# Patient Record
Sex: Female | Born: 1975
Health system: Southern US, Community
[De-identification: ages and names within clinical notes are randomized; demographics above are authoritative.]

## PROBLEM LIST (undated history)

## (undated) ENCOUNTER — Inpatient Hospital Stay (HOSPITAL_COMMUNITY): Payer: Self-pay

## (undated) DIAGNOSIS — O09529 Supervision of elderly multigravida, unspecified trimester: Secondary | ICD-10-CM

## (undated) DIAGNOSIS — Z3490 Encounter for supervision of normal pregnancy, unspecified, unspecified trimester: Secondary | ICD-10-CM

## (undated) DIAGNOSIS — A599 Trichomoniasis, unspecified: Secondary | ICD-10-CM

## (undated) DIAGNOSIS — F32A Depression, unspecified: Secondary | ICD-10-CM

## (undated) DIAGNOSIS — D649 Anemia, unspecified: Secondary | ICD-10-CM

## (undated) DIAGNOSIS — F4310 Post-traumatic stress disorder, unspecified: Secondary | ICD-10-CM

## (undated) DIAGNOSIS — N949 Unspecified condition associated with female genital organs and menstrual cycle: Secondary | ICD-10-CM

## (undated) DIAGNOSIS — R0602 Shortness of breath: Secondary | ICD-10-CM

## (undated) DIAGNOSIS — F329 Major depressive disorder, single episode, unspecified: Secondary | ICD-10-CM

## (undated) DIAGNOSIS — I10 Essential (primary) hypertension: Secondary | ICD-10-CM

## (undated) HISTORY — DX: Depression, unspecified: F32.A

## (undated) HISTORY — DX: Essential (primary) hypertension: I10

## (undated) HISTORY — DX: Post-traumatic stress disorder, unspecified: F43.10

## (undated) HISTORY — DX: Supervision of elderly multigravida, unspecified trimester: O09.529

## (undated) HISTORY — DX: Encounter for supervision of normal pregnancy, unspecified, unspecified trimester: Z34.90

## (undated) HISTORY — DX: Anemia, unspecified: D64.9

## (undated) HISTORY — DX: Major depressive disorder, single episode, unspecified: F32.9

## (undated) HISTORY — DX: Unspecified condition associated with female genital organs and menstrual cycle: N94.9

## (undated) HISTORY — DX: Shortness of breath: R06.02

## (undated) HISTORY — PX: CHOLECYSTECTOMY: SHX55

## (undated) NOTE — *Deleted (*Deleted)
Physical Medicine and Rehabilitation Admission H&P    Chief Complaint  Patient presents with  . Numbness  : HPI: Joyce [name] is a 64 year old right-handed female with documented history of hypertension, depression and migraine headaches.  Per chart review patient lives with her spouse and children.  Independent prior to admission.  Two-level home bed and bath upstairs.  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  Presented to [facility] 03/04/2020 left-sided weakness.  She denied any chest pain shortness of breath nausea or vomiting.  Cranial CT scan showed no acute changes.  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  Carotid Dopplers with no ICA stenosis.  MRA of the head with no proximal intracranial vessel occlusion or significant stenosis.  Admission chemistries unremarkable except glucose 114, alcohol negative, hemoglobin 15.4, WBC 12,200.  Echocardiogram with ejection fraction of 60 to 65% no wall motion abnormalities.  Presently maintained on aspirin and Plavix for CVA prophylaxis x1 month followed by aspirin alone.  She continues on Topamax for history of headaches.  Tolerating a regular diet.  Therapy evaluations completed and patient was admitted for a comprehensive rehab program.  Review of Systems  Constitutional: Negative for chills and fever.  HENT: Negative for hearing loss.   Eyes: Negative for blurred vision, double vision and discharge.  Respiratory: Negative for shortness of breath.   Cardiovascular: Negative for chest pain, palpitations and leg swelling.  Gastrointestinal: Positive for constipation. Negative for heartburn, nausea and vomiting.  Genitourinary: Negative for dysuria, flank pain and hematuria.  Musculoskeletal: Positive for myalgias.  Neurological: Positive for weakness and headaches.  Psychiatric/Behavioral: Positive for depression. The patient has insomnia.   All other systems reviewed and are negative.  Past Medical  History:  Diagnosis Date  . Acute CVA (cerebrovascular accident) (HCC) 03/04/2020  . AMA (advanced maternal age) multigravida 35+ 11/12/2014  . Anemia   . Depression   . Hypertension   . Migraine   . Pregnant 11/12/2014  . PTSD (post-traumatic stress disorder) 05/01/2017  . Round ligament pain 11/12/2014  . Short of breath on exertion 11/12/2014  . Trichomonas infection    Past Surgical History:  Procedure Laterality Date  . CHOLECYSTECTOMY     Family History  Problem Relation Age of Onset  . Diabetes Mother   . Hypertension Mother   . Kidney disease Mother   . Asthma Mother   . Hyperlipidemia Mother   . Alzheimer's disease Father   . Diabetes Father   . Kidney disease Sister   . Asthma Son   . Diabetes Sister   . Asthma Son   . ADD / ADHD Son   . Heart murmur Son   . Asthma Son    Social History:  reports that she has never smoked. She has never used smokeless tobacco. She reports current alcohol use. She reports that she does not use drugs. Allergies: No Known Allergies Medications Prior to Admission  Medication Sig Dispense Refill  . amLODipine (NORVASC) 5 MG tablet TAKE 1 TABLET(5 MG) BY MOUTH DAILY (Patient taking differently: Take 5 mg by mouth daily. ) 30 tablet 1  . dexamethasone (DECADRON) 4 MG tablet Take 1 tablet (4 mg total) by mouth 2 (two) times daily with a meal. (Patient taking differently: Take 4 mg by mouth daily. ) 10 tablet 0  . escitalopram (LEXAPRO) 20 MG tablet Take 1 tablet (20 mg total) by mouth daily. 30 tablet 0  . hydrOXYzine (ATARAX/VISTARIL) 25 MG tablet Take  1 tablet (25 mg total) by mouth every 6 (six) hours as needed for anxiety. 20 tablet 1  . ibuprofen (ADVIL,MOTRIN) 200 MG tablet Take 200 mg by mouth every 6 (six) hours as needed for mild pain or moderate pain.    [name] lisinopril-hydrochlorothiazide (PRINZIDE,ZESTORETIC) 20-25 MG tablet take one tablet by mouth once daily  3  . medroxyPROGESTERone (DEPO-PROVERA) 150 MG/ML injection Inject 1 mL  (150 mg total) into the muscle every 3 (three) months. 1 mL 4  . promethazine (PHENERGAN) 25 MG tablet Take 1 tablet (25 mg total) by mouth every 6 (six) hours as needed for nausea or vomiting. 30 tablet 0  . SUMAtriptan (IMITREX) 50 MG tablet Take 1 tablet (50 mg total) by mouth once. May repeat in 2 hours if headache persists or recurs. 20 tablet 10  . traZODone (DESYREL) 50 MG tablet Take 1 tablet (50 mg total) by mouth at bedtime as needed for sleep. (Patient taking differently: Take 50 mg by mouth at bedtime. ) 30 tablet 0  . Prenatal Vit-Fe Fumarate-FA (PREPLUS) 27-1 MG TABS TAKE 1 TABLET BY MOUTH DAILY AT 12 NOON (Patient not taking: Reported on 03/05/2020) 651 tablet 0    Drug Regimen Review Drug regimen was reviewed and remains appropriate with no significant issues identified  Home: Home Living Family/patient expects to be discharged to:: Inpatient rehab Living Arrangements: Spouse/significant other, Children Available Help at Discharge: Family, Available 24 hours/day Type of Home: House Home Access: Stairs to enter [employer] of Steps: 4 Entrance Stairs-Rails: None Home Layout: Two level, Bed/bath upstairs Alternate Level Stairs-Number of Steps: 10-15 (1 flight) Alternate Level Stairs-Rails: Left Bathroom Accessibility: Yes Home Equipment: None   Functional History: Prior Function Level of Independence: Independent Comments: community ambulator, drives. 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  Functional Status:  Mobility: Bed Mobility Overal bed mobility: Needs Assistance Bed Mobility: Supine to Sit, Sit to Supine Supine to sit: Supervision, HOB elevated Sit to supine: Min assist General bed mobility comments: assist for LLE back into bed Transfers Overall transfer level: Needs assistance Equipment used: 1 person hand held assist, Rolling walker (2 wheeled) Transfers: Sit to/from Stand, Stand Pivot Transfers Sit to Stand: Min assist, Mod assist Stand pivot  transfers: Min assist, Mod assist General transfer comment: Patient uses bed and PT assist to tranfer to standing and reaches for RW following standing, patient unsteady upon initial standing which improves with use of RW. Heavy UE use with standing, limited weightbearing on LLE due to apprehension Ambulation/Gait Ambulation/Gait assistance: Min assist, Mod assist Gait Distance (Feet): 60 Feet Assistive device: Rolling walker (2 wheeled) Gait Pattern/deviations: Decreased step length - right, Decreased step length - left, Decreased stride length, Decreased dorsiflexion - left, Step-to pattern General Gait Details: very unsteady with poor tolerance for weightbearing on LLE due to weakness and impaired sensation, required use of RW demonstrating slow labored cadence. Patient with c/o hand symptoms increasing with gripping on RW and with fatigue requiring standing rest breaks Gait velocity: decreased    ADL: ADL Overall ADL's : Needs assistance/impaired Eating/Feeding: Set up, Sitting Grooming: Wash/dry hands, Wash/dry face, Oral care, Applying deodorant, Minimal assistance, Sitting Upper Body Bathing: Minimal assistance, Sitting Lower Body Bathing: Moderate assistance, Sitting/lateral leans Upper Body Dressing : Moderate assistance, Sitting Lower Body Dressing: Moderate assistance, Sitting/lateral leans Toilet Transfer Details (indicate cue type and reason): Not tested this date General ADL Comments: Slow labored movements with all activities. Requires increased assistance with LUE due to muscle weakness.  Cognition: Cognition  Overall Cognitive Status: Within Functional Limits for tasks assessed Orientation Level: Oriented X4 Cognition Arousal/Alertness: Awake/alert Behavior During Therapy: WFL for tasks assessed/performed Overall Cognitive Status: Within Functional Limits for tasks assessed  Physical Exam: Blood pressure 136/79, pulse 79, temperature 98.6 F (37 C), temperature  source Oral, resp. rate 20, height 5\' 6"  (1.676 m), weight 97.9 kg, SpO2 100 %. Physical Exam Neurological:     Comments: Patient is alert in no acute distress.  Speech is fluent.  Oriented x3 and follows commands.     No results found for this or any previous visit (from the past 48 hour(s)). No results found.     Medical Problem List and Plan: 1.  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  Antithrombotics: -DVT/anticoagulation: SCDs  -antiplatelet therapy: Aspirin 305 mg daily and Plavix 75 mg daily x1 month then aspirin alone 3. Pain Management: Lidoderm patch, oxycodone as needed 4. Mood: Provide emotional support  -antipsychotic agents: N/A 5. Neuropsych: This patient is capable of making decisions on her own behalf. 6. Skin/Wound Care: Routine skin checks 7. Fluids/Electrolytes/Nutrition: Routine in and outs with follow-up chemistries 8.  Migraine headaches.  Topamax 25 mg daily 9.  Hyperlipidemia.  Lipitor    ***  Charlton Amor, PA-C 03/09/2020

---

## 2011-06-06 ENCOUNTER — Emergency Department (HOSPITAL_COMMUNITY)
Admission: EM | Admit: 2011-06-06 | Discharge: 2011-06-07 | Disposition: A | Discharge status: Home / Self Care | Payer: Medicaid Other | Attending: Emergency Medicine | Admitting: Emergency Medicine

## 2011-06-06 DIAGNOSIS — D649 Anemia, unspecified: Secondary | ICD-10-CM | POA: Insufficient documentation

## 2011-06-06 DIAGNOSIS — R109 Unspecified abdominal pain: Secondary | ICD-10-CM | POA: Insufficient documentation

## 2011-06-06 DIAGNOSIS — E876 Hypokalemia: Secondary | ICD-10-CM | POA: Insufficient documentation

## 2011-06-06 DIAGNOSIS — K219 Gastro-esophageal reflux disease without esophagitis: Secondary | ICD-10-CM

## 2011-06-06 DIAGNOSIS — R51 Headache: Secondary | ICD-10-CM | POA: Insufficient documentation

## 2011-06-06 MED ORDER — SODIUM CHLORIDE 0.9 % IV SOLN: Freq: Once | INTRAVENOUS | Status: DC

## 2011-06-06 NOTE — ED Notes (Signed)
1.ab pain that started tonight after eating at [employer], denies any n/v/or fever 2.migraine since yesterday out of meds--"a couple of months", dizzy at times

## 2011-06-07 LAB — COMPREHENSIVE METABOLIC PANEL
ALT: 13 U/L (ref 0–35)
AST: 16 U/L (ref 0–37)
Alkaline Phosphatase: 56 U/L (ref 39–117)
CO2: 30 mEq/L (ref 19–32)
Chloride: 106 mEq/L (ref 96–112)
Creatinine, Ser: 0.73 mg/dL (ref 0.50–1.10)
GFR calc non Af Amer: >90 mL/min (ref >90)
Potassium: 3.4 mEq/L — ABNORMAL LOW (ref 3.5–5.1)
Sodium: 142 mEq/L (ref 135–145)
Total Bilirubin: 0.2 mg/dL — ABNORMAL LOW (ref 0.3–1.2)

## 2011-06-07 LAB — DIFFERENTIAL
Basophils Absolute: 0 10*3/uL (ref 0.0–0.1)
Lymphocytes Relative: 31 % (ref 12–46)
Monocytes Absolute: 0.5 10*3/uL (ref 0.1–1.0)
Neutro Abs: 5.8 10*3/uL (ref 1.7–7.7)
Neutrophils Relative %: 63 % (ref 43–77)

## 2011-06-07 LAB — CBC
HCT: 33.9 % — ABNORMAL LOW (ref 36.0–46.0)
RDW: 17.9 % — ABNORMAL HIGH (ref 11.5–15.5)
WBC: 9.1 10*3/uL (ref 4.0–10.5)

## 2011-06-07 MED ORDER — SODIUM CHLORIDE 0.9 % IV BOLUS (SEPSIS)
1000.0000 mL | Freq: Once | INTRAVENOUS | Status: AC
  Administered 2011-06-07: 1000 mL via INTRAVENOUS

## 2011-06-07 MED ORDER — PROMETHAZINE HCL 25 MG PO TABS: 25.0000 mg | ORAL_TABLET | Freq: Four times a day (QID) | ORAL | Status: DC | PRN

## 2011-06-07 MED ORDER — DIPHENHYDRAMINE HCL 50 MG/ML IJ SOLN
50.0000 mg | Freq: Once | INTRAMUSCULAR | Status: AC
  Administered 2011-06-07: 50 mg via INTRAVENOUS
  Filled 2011-06-07: qty 1

## 2011-06-07 MED ORDER — FAMOTIDINE IN NACL 20-0.9 MG/50ML-% IV SOLN
20.0000 mg | Freq: Once | INTRAVENOUS | Status: AC
  Administered 2011-06-07: 20 mg via INTRAVENOUS
  Filled 2011-06-07: qty 50

## 2011-06-07 MED ORDER — METOCLOPRAMIDE HCL 5 MG/ML IJ SOLN
10.0000 mg | Freq: Once | INTRAMUSCULAR | Status: AC
  Administered 2011-06-07: 10 mg via INTRAVENOUS
  Filled 2011-06-07: qty 2

## 2011-06-07 MED ORDER — SODIUM CHLORIDE 0.9 % IV SOLN: INTRAVENOUS | Status: DC

## 2011-06-07 NOTE — ED Provider Notes (Signed)
History     CSN: [id number]  Arrival date & time 06/06/11  2252   First MD Initiated Contact with Patient 06/07/11 0034      Chief Complaint  Patient presents with  . Abdominal Pain    buring into throat  . Migraine    (Consider location/radiation/quality/duration/timing/severity/associated sxs/prior treatment) HPI  Patient relates she started having lower abdominal pain but this is described as burning and radiates up into her chest this started about an hour prior to arrival. She relates it started after eating a double cheeseburger from [employer] which she's had in the past. She denies the pain radiating to her back. She denies nausea, vomiting, or cough. She states deep breathing etc. pain worse she states she does feel short of breath. She states she had similar pain once before last month and was seen in another hospital. She relates she had a cholecystectomy in 1997. She also relates she started having a headache about a year ago. She states this headache started about 2 days ago. The location is in the frontal part of her head. She states it throbs and she has photophobia and noise sensitivity with it. She states she was diagnosed as having migraines when she lived in [place].  PCP none  Past Medical History  Diagnosis Date  . Migraine     Past Surgical History  Procedure Date  . Cholecystectomy     No family history on file.  History  Substance Use Topics  . Smoking status: Never Smoker   . Smokeless tobacco: Not on file  . Alcohol Use: No   patient is unemployed  OB History    Grav Para Term Preterm Abortions TAB SAB Ect Mult Living                  Review of Systems  All other systems reviewed and are negative.    Allergies  Review of patient's allergies indicates no known allergies.  Home Medications  No current outpatient prescriptions on file.  BP 122/67  Pulse 67  Temp(Src) 98.7 F (37.1 C) (Oral)  Resp 20  Ht 5\' 5"  (1.651 m)  Wt 228 lb  (103.42 kg)  BMI 37.94 kg/m2  SpO2 100%  LMP 05/31/2011  Vital signs normal    Physical Exam  Nursing note and vitals reviewed. Constitutional: She is oriented to person, place, and time. She appears well-developed and well-nourished.  Non-toxic appearance. She does not appear ill. No distress.  HENT:  Head: Normocephalic and atraumatic.  Right Ear: External ear normal.  Left Ear: External ear normal.  Nose: Nose normal. No mucosal edema or rhinorrhea.  Mouth/Throat: Oropharynx is clear and moist and mucous membranes are normal. No dental abscesses or uvula swelling.  Eyes: Conjunctivae and EOM are normal. Pupils are equal, round, and reactive to light.  Neck: Normal range of motion and full passive range of motion without pain. Neck supple.  Cardiovascular: Normal rate, regular rhythm and normal heart sounds.  Exam reveals no gallop and no friction rub.   No murmur heard. Pulmonary/Chest: Effort normal and breath sounds normal. No respiratory distress. She has no wheezes. She has no rhonchi. She has no rales. She exhibits no tenderness and no crepitus.  Abdominal: Soft. Normal appearance and bowel sounds are normal. She exhibits no distension. There is tenderness. There is no rebound and no guarding.       Patient has some mild tenderness diffusely without localization  Musculoskeletal: Normal range of motion. She exhibits  no edema and no tenderness.       Moves all extremities well.   Neurological: She is alert and oriented to person, place, and time. She has normal strength. No cranial nerve deficit.  Skin: Skin is warm, dry and intact. No rash noted. No erythema. No pallor.  Psychiatric: Her speech is normal and behavior is normal. Her mood appears not anxious.       Affect is flat    ED Course  Procedures (including critical care time)   Medications  0.9 %  sodium chloride infusion (  Intravenous Handoff 06/06/11 2302)  0.9 %  sodium chloride infusion (not administered)    sodium chloride 0.9 % bolus 1,000 mL (1000 mL Intravenous Given 06/07/11 0138)  metoCLOPramide (REGLAN) injection 10 mg (10 mg Intravenous Given 06/07/11 0138)  diphenhydrAMINE (BENADRYL) injection 50 mg (50 mg Intravenous Given 06/07/11 0138)  famotidine (PEPCID) IVPB 20 mg (20 mg Intravenous New Bag/Given 06/07/11 0138)    Recheck at 03:45 patient states she is much better and ready to go home.    Results for orders placed during the hospital encounter of 06/06/11  CBC      Component Value Range   WBC 9.1  4.0 - 10.5 (K/uL)   RBC 4.84  3.87 - 5.11 (MIL/uL)   Hemoglobin 10.9 (*) 12.0 - 15.0 (g/dL)   HCT 16.1 (*) 09.6 - 46.0 (%)   MCV 70.0 (*) 78.0 - 100.0 (fL)   MCH 22.5 (*) 26.0 - 34.0 (pg)   MCHC 32.2  30.0 - 36.0 (g/dL)   RDW 04.5 (*) 40.9 - 15.5 (%)   Platelets 319  150 - 400 (K/uL)  DIFFERENTIAL      Component Value Range   Neutrophils Relative 63  43 - 77 (%)   Neutro Abs 5.8  1.7 - 7.7 (K/uL)   Lymphocytes Relative 31  12 - 46 (%)   Lymphs Abs 2.8  0.7 - 4.0 (K/uL)   Monocytes Relative 6  3 - 12 (%)   Monocytes Absolute 0.5  0.1 - 1.0 (K/uL)   Eosinophils Relative 1  0 - 5 (%)   Eosinophils Absolute 0.1  0.0 - 0.7 (K/uL)   Basophils Relative 0  0 - 1 (%)   Basophils Absolute 0.0  0.0 - 0.1 (K/uL)  COMPREHENSIVE METABOLIC PANEL      Component Value Range   Sodium 142  135 - 145 (mEq/L)   Potassium 3.4 (*) 3.5 - 5.1 (mEq/L)   Chloride 106  96 - 112 (mEq/L)   CO2 30  19 - 32 (mEq/L)   Glucose, Bld 105 (*) 70 - 99 (mg/dL)   BUN 7  6 - 23 (mg/dL)   Creatinine, Ser 8.11  0.50 - 1.10 (mg/dL)   Calcium 9.6  8.4 - 91.4 (mg/dL)   Total Protein 7.7  6.0 - 8.3 (g/dL)   Albumin 3.6  3.5 - 5.2 (g/dL)   AST 16  0 - 37 (U/L)   ALT 13  0 - 35 (U/L)   Alkaline Phosphatase 56  39 - 117 (U/L)   Total Bilirubin 0.2 (*) 0.3 - 1.2 (mg/dL)   GFR calc non Af Amer >90  >90 (mL/min)   GFR calc Af Amer >90  >90 (mL/min)  LIPASE, BLOOD      Component Value Range   Lipase 26  11 - 59 (U/L)    Laboratory interpretation all normal except mild hypokalemia, anemia    Diagnoses that have been ruled out:  Diagnoses that are still under consideration:  Final diagnoses:  Headache  Abdominal pain  GERD (gastroesophageal reflux disease)   New Prescriptions   PROMETHAZINE (PHENERGAN) 25 MG TABLET    Take 1 tablet (25 mg total) by mouth every 6 (six) hours as needed for nausea.   Plan discharge    Devoria Albe, MD, FACEP     MDM          Ward Givens, MD 06/07/11 [phone]

## 2011-07-23 ENCOUNTER — Emergency Department (HOSPITAL_COMMUNITY)
Admission: EM | Admit: 2011-07-23 | Discharge: 2011-07-23 | Disposition: A | Discharge status: Home / Self Care | Payer: Medicaid Other | Attending: Emergency Medicine | Admitting: Emergency Medicine

## 2011-07-23 ENCOUNTER — Encounter (HOSPITAL_COMMUNITY): Payer: Self-pay | Admitting: Emergency Medicine

## 2011-07-23 DIAGNOSIS — J3489 Other specified disorders of nose and nasal sinuses: Secondary | ICD-10-CM | POA: Insufficient documentation

## 2011-07-23 DIAGNOSIS — J069 Acute upper respiratory infection, unspecified: Secondary | ICD-10-CM | POA: Insufficient documentation

## 2011-07-23 MED ORDER — GUAIFENESIN-CODEINE 100-10 MG/5ML PO SOLN
5.0000 mL | Freq: Once | ORAL | Status: AC
  Administered 2011-07-23: 5 mL via ORAL
  Filled 2011-07-23: qty 5

## 2011-07-23 MED ORDER — IBUPROFEN 800 MG PO TABS
800.0000 mg | ORAL_TABLET | Freq: Once | ORAL | Status: AC
  Administered 2011-07-23: 800 mg via ORAL
  Filled 2011-07-23: qty 1

## 2011-07-23 NOTE — ED Provider Notes (Signed)
History     CSN: [id number]  Arrival date & time 07/23/11  0116   First MD Initiated Contact with Patient 07/23/11 0130      Chief Complaint  Patient presents with  . Sore Throat  . Cough  . Nasal Congestion    (Consider location/radiation/quality/duration/timing/severity/associated sxs/prior treatment) HPI OPAL DINNING is a 36 y.o. female who presents to the Emergency Department complaining of sore throat, runny nose, cough, and nasal congestion x 2 days. She has been taking daquel and niquellwith no relief. Denies fever, chills, nausea, vomiting, shortness of breath. Cough is worse at night.   No PCP   Past Medical History  Diagnosis Date  . Migraine     Past Surgical History  Procedure Date  . Cholecystectomy     No family history on file.  History  Substance Use Topics  . Smoking status: Never Smoker   . Smokeless tobacco: Not on file  . Alcohol Use: No    OB History    Grav Para Term Preterm Abortions TAB SAB Ect Mult Living                  Review of Systems A 10 review of systems reviewed and are negative for acute change except as noted in the HPI. Allergies  Review of patient's allergies indicates no known allergies.  Home Medications  No current outpatient prescriptions on file.  BP 134/88  Pulse 86  Temp(Src) 98.7 F (37.1 C) (Oral)  Resp 20  Ht 5\' 5"  (1.651 m)  Wt 226 lb (102.513 kg)  BMI 37.61 kg/m2  SpO2 100%  LMP 07/16/2011  Physical Exam Physical examination:  Nursing notes reviewed; Vital signs and O2 SAT reviewed;  Constitutional: Well developed, Well nourished, Well hydrated, In no acute distress; Head:  Normocephalic, atraumatic; Eyes: EOMI, PERRL, No scleral icterus; ENMT: Mouth and pharynx normal, Mucous membranes moist nasal mucosa normal; Neck: Supple, Full range of motion, No lymphadenopathy; Cardiovascular: Regular rate and rhythm, No murmur, rub, or gallop; Respiratory: Breath sounds clear & equal bilaterally, No  rales, rhonchi, wheezes, or rub, Normal respiratory effort/excursion; Chest: Nontender, Movement normal; Abdomen: Soft, Nontender, Nondistended, Normal bowel sounds; Genitourinary: No CVA tenderness; Extremities: Pulses normal, No tenderness, No edema, No calf edema or asymmetry.; Neuro: AA&Ox3, Major CN grossly intact.  No gross focal motor or sensory deficits in extremities.; Skin: Color normal, Warm, Dry  ED Course  Procedures (including critical care time)     MDM  Patient with URI symptoms x 2 days with no relief from OTC medicines. Given ibuprofen and cough medicine. Pt stable in ED with no significant deterioration in condition.The patient appears reasonably screened and/or stabilized for discharge and I doubt any other medical condition or other [facility] requiring further screening, evaluation, or treatment in the ED at this time prior to discharge.  MDM Reviewed: nursing note and vitals           Nicoletta Dress. Colon Branch, MD 07/23/11 [id number]

## 2011-07-23 NOTE — ED Notes (Signed)
Patient c/o sore throat, cough and congestion x 2 days; states has been taking OTC medicine without relief.

## 2011-07-23 NOTE — Discharge Instructions (Signed)
DRINK LOTS OF FLUIDS. USE TYLENOL OR IBUPROFEN FOR ACHES, PAINS AND ANY FEVERS. USE SALT WATER GARGLES, LOZENGES, HOT BEVERAGES TO HELP WITH THE SORE THROAT. USE SALINE NOSE DROPS FOR THE RUNNY NOSE. USE ROB TUSSIN DM OR VICKS FORMULA 44 COUGH MEDICINE.

## 2012-03-13 ENCOUNTER — Emergency Department (HOSPITAL_COMMUNITY)
Admission: EM | Admit: 2012-03-13 | Discharge: 2012-03-13 | Disposition: A | Discharge status: Home / Self Care | Payer: Medicaid Other | Attending: Emergency Medicine | Admitting: Emergency Medicine

## 2012-03-13 ENCOUNTER — Encounter (HOSPITAL_COMMUNITY): Payer: Self-pay | Admitting: *Deleted

## 2012-03-13 DIAGNOSIS — L03818 Cellulitis of other sites: Secondary | ICD-10-CM | POA: Insufficient documentation

## 2012-03-13 DIAGNOSIS — L0291 Cutaneous abscess, unspecified: Secondary | ICD-10-CM

## 2012-03-13 DIAGNOSIS — L02818 Cutaneous abscess of other sites: Secondary | ICD-10-CM | POA: Insufficient documentation

## 2012-03-13 MED ORDER — KETOROLAC TROMETHAMINE 10 MG PO TABS
10.0000 mg | ORAL_TABLET | Freq: Once | ORAL | Status: AC
  Administered 2012-03-13: 10 mg via ORAL
  Filled 2012-03-13: qty 1

## 2012-03-13 MED ORDER — OXYCODONE-ACETAMINOPHEN 5-325 MG PO TABS
2.0000 | ORAL_TABLET | Freq: Once | ORAL | Status: AC
  Administered 2012-03-13: 2 via ORAL
  Filled 2012-03-13: qty 2

## 2012-03-13 MED ORDER — ONDANSETRON HCL 4 MG PO TABS
4.0000 mg | ORAL_TABLET | Freq: Once | ORAL | Status: AC
  Administered 2012-03-13: 4 mg via ORAL
  Filled 2012-03-13: qty 1

## 2012-03-13 MED ORDER — LIDOCAINE-EPINEPHRINE (PF) 1 %-1:200000 IJ SOLN
INTRAMUSCULAR | Status: AC
  Filled 2012-03-13: qty 10

## 2012-03-13 MED ORDER — OXYCODONE-ACETAMINOPHEN 5-325 MG PO TABS: 1.0000 | ORAL_TABLET | Freq: Four times a day (QID) | ORAL | Status: DC | PRN

## 2012-03-13 MED ORDER — DOXYCYCLINE HYCLATE 100 MG PO CAPS: 100.0000 mg | ORAL_CAPSULE | Freq: Two times a day (BID) | ORAL | Status: DC

## 2012-03-13 MED ORDER — AMOXICILLIN 500 MG PO CAPS: 500.0000 mg | ORAL_CAPSULE | Freq: Three times a day (TID) | ORAL | Status: DC

## 2012-03-13 MED ORDER — DICLOFENAC SODIUM 75 MG PO TBEC: 75.0000 mg | DELAYED_RELEASE_TABLET | Freq: Two times a day (BID) | ORAL | Status: AC

## 2012-03-13 NOTE — ED Notes (Signed)
Swelling on back on neck, states it started yesterday and got worse today

## 2012-03-13 NOTE — ED Provider Notes (Signed)
History     CSN: [id number]  Arrival date & time 03/13/12  2148   First MD Initiated Contact with Patient 03/13/12 2200      No chief complaint on file.   (Consider location/radiation/quality/duration/timing/severity/associated sxs/prior treatment) HPI Comments: Patient presents to the emergency department with a painful raised area on the right side of her neck at the base of the scalp. The patient has recently stopped her here in a corn row fashion. The patient states she's not sure she may have scratched herself at that time or not. The patient denies any recent use of relaxers or conditioners. Patient denies any known insect bites to this area. She complains of pain in the right side of the neck that causes pain also in the right shoulder. The problem seems to have been getting worse today. Patient denies any recent fever or chills.  The history is provided by the patient.    Past Medical History  Diagnosis Date  . Migraine     Past Surgical History  Procedure Date  . Cholecystectomy     No family history on file.  History  Substance Use Topics  . Smoking status: Never Smoker   . Smokeless tobacco: Not on file  . Alcohol Use: No    OB History    Grav Para Term Preterm Abortions TAB SAB Ect Mult Living                  Review of Systems  Constitutional: Negative for activity change.       All ROS Neg except as noted in HPI  HENT: Positive for dental problem. Negative for neck pain.   Eyes: Negative for discharge.  Respiratory: Negative for wheezing.   Cardiovascular: Negative for chest pain and palpitations.  Gastrointestinal: Negative for abdominal pain and blood in stool.  Genitourinary: Negative for dysuria, frequency and hematuria.  Musculoskeletal: Negative for back pain and arthralgias.  Skin: Negative.   Neurological: Positive for headaches. Negative for dizziness, seizures and speech difficulty.  Psychiatric/Behavioral: Negative for hallucinations  and confusion.    Allergies  Review of patient's allergies indicates no known allergies.  Home Medications  No current outpatient prescriptions on file.  BP 147/85  Pulse 94  Temp 98.4 F (36.9 C) (Oral)  Resp 20  Ht 5\' 5"  (1.651 m)  Wt 219 lb (99.338 kg)  BMI 36.44 kg/m2  SpO2 100%  LMP 03/09/2012  Physical Exam  Nursing note and vitals reviewed. Constitutional: She is oriented to person, place, and time. She appears well-developed and well-nourished.  Non-toxic appearance.  HENT:  Head: Normocephalic.  Right Ear: Tympanic membrane and external ear normal.  Left Ear: Tympanic membrane and external ear normal.       There is a quarter size raised red tender area to palpation at the right base of the scalp. There is no red streaking noted. Is no drainage from the area. The area is tender to touch. The area is warm but not hot. There no palpable nodes present.  Eyes: EOM and lids are normal. Pupils are equal, round, and reactive to light.  Neck: Normal range of motion. Neck supple. Carotid bruit is not present.  Cardiovascular: Normal rate, regular rhythm, normal heart sounds, intact distal pulses and normal pulses.   Pulmonary/Chest: Breath sounds normal. No respiratory distress.  Abdominal: Soft. Bowel sounds are normal. There is no tenderness. There is no guarding.  Musculoskeletal: Normal range of motion.  Lymphadenopathy:       Head (  right side): No submandibular adenopathy present.       Head (left side): No submandibular adenopathy present.    She has no cervical adenopathy.  Neurological: She is alert and oriented to person, place, and time. She has normal strength. No cranial nerve deficit or sensory deficit.  Skin: Skin is warm and dry.  Psychiatric: Her speech is normal. Her mood appears anxious.    ED Course  Procedures:ASPIRATION OF ABSCESS - patient identified by arm band. Permission for the procedure given by the patient. Procedural time out taken before  aspiration of abscess of the right scalp. The right lower scalp was painted with alcohol, this was followed by painting with Betadine. A wheal was raised with 1% lidocaine with epinephrine. The area was then aspirated with very minimal pus like material present. Pressure was applied. A sterile bandage was applied by me. Patient tolerated the procedure without problem. The small amount of drainage obtained was sent to the lab for culture.  Labs Reviewed - No data to display No results found. Pulse oximetry 100% on room air. Within normal limits by my interpretation.  No diagnosis found.    MDM  I have reviewed nursing notes, vital signs, and all appropriate lab and imaging results for this patient. Patient has a abscess at the right lower scalp. Patient is advised to use warm tub soaks daily. She will be placed on doxycycline, diclofenac, and Percocet #15. Patient advised to return if any changes or problems.       Kathie Dike, [place] 03/13/12 [phone]

## 2012-03-13 NOTE — ED Notes (Signed)
Swollen area to left post neck,x 2 days,  Tender to palp, No scalp lesions known.

## 2012-03-14 NOTE — ED Provider Notes (Signed)
Medical screening examination/treatment/procedure(s) were performed by non-physician practitioner and as supervising physician I was immediately available for consultation/collaboration.   Horacio Werth L Sameen Leas, MD 03/14/12 0008 

## 2012-03-17 LAB — CULTURE, ROUTINE-ABSCESS: Culture: NO GROWTH

## 2012-06-09 ENCOUNTER — Encounter (HOSPITAL_COMMUNITY): Payer: Self-pay | Admitting: Emergency Medicine

## 2012-06-09 ENCOUNTER — Emergency Department (HOSPITAL_COMMUNITY)
Admission: EM | Admit: 2012-06-09 | Discharge: 2012-06-09 | Disposition: A | Discharge status: Home / Self Care | Payer: Medicaid Other | Attending: Emergency Medicine | Admitting: Emergency Medicine

## 2012-06-09 DIAGNOSIS — Z8679 Personal history of other diseases of the circulatory system: Secondary | ICD-10-CM | POA: Insufficient documentation

## 2012-06-09 DIAGNOSIS — Z792 Long term (current) use of antibiotics: Secondary | ICD-10-CM | POA: Insufficient documentation

## 2012-06-09 DIAGNOSIS — M545 Low back pain, unspecified: Secondary | ICD-10-CM | POA: Insufficient documentation

## 2012-06-09 DIAGNOSIS — Z79899 Other long term (current) drug therapy: Secondary | ICD-10-CM | POA: Insufficient documentation

## 2012-06-09 LAB — URINALYSIS, ROUTINE W REFLEX MICROSCOPIC
Leukocytes, UA: NEGATIVE
Nitrite: NEGATIVE
Specific Gravity, Urine: <1.005 — ABNORMAL LOW (ref 1.005–1.030)
Urobilinogen, UA: 0.2 mg/dL (ref 0.0–1.0)
pH: 6 (ref 5.0–8.0)

## 2012-06-09 MED ORDER — OXYCODONE-ACETAMINOPHEN 5-325 MG PO TABS
2.0000 | ORAL_TABLET | Freq: Once | ORAL | Status: AC
  Administered 2012-06-09: 2 via ORAL
  Filled 2012-06-09: qty 2

## 2012-06-09 MED ORDER — IBUPROFEN 800 MG PO TABS
800.0000 mg | ORAL_TABLET | Freq: Once | ORAL | Status: AC
  Administered 2012-06-09: 800 mg via ORAL
  Filled 2012-06-09: qty 1

## 2012-06-09 MED ORDER — METHOCARBAMOL 500 MG PO TABS: 500.0000 mg | ORAL_TABLET | Freq: Two times a day (BID) | ORAL | Status: DC

## 2012-06-09 MED ORDER — HYDROCODONE-ACETAMINOPHEN 5-325 MG PO TABS: 1.0000 | ORAL_TABLET | Freq: Four times a day (QID) | ORAL | Status: DC | PRN

## 2012-06-09 MED ORDER — IBUPROFEN 600 MG PO TABS: 600.0000 mg | ORAL_TABLET | Freq: Four times a day (QID) | ORAL | Status: DC | PRN

## 2012-06-09 NOTE — ED Notes (Addendum)
Patient c/o pain to right side of lower back since Jan 1st.  Patient states pain is worse with movement of her neck in downward motion; states pain radiates down her legs.

## 2012-06-09 NOTE — ED Provider Notes (Signed)
History     CSN: [id number]  Arrival date & time 06/09/12  0205   First MD Initiated Contact with Patient 06/09/12 0344      Chief Complaint  Patient presents with  . Back Pain    (Consider location/radiation/quality/duration/timing/severity/associated sxs/prior treatment) HPI Joyce [name] is a 37 y.o. female presenting with back pain. This pain has been present since January 1, it is been getting worse, this has not been resolved with medicine that she's taken from the emergency department previously. Patient denies any antecedent trauma, denies any weight loss, fevers, night sweats, weakness, numbness or tingling. Her pain is on the right side of the lower back, it radiates to the mid posterior thigh, pain is currently 9/10, she has tried [employer], icy hot without any help. No loss of bowel or bladder function, no urinary retention, or numbness or tingling in the perianal region.   Past Medical History  Diagnosis Date  . Migraine     Past Surgical History  Procedure Date  . Cholecystectomy     No family history on file.  History  Substance Use Topics  . Smoking status: Never Smoker   . Smokeless tobacco: Not on file  . Alcohol Use: No    OB History    Grav Para Term Preterm Abortions TAB SAB Ect Mult Living                  Review of Systems At least 10pt or greater review of systems completed and are negative except where specified in the HPI.  Allergies  Review of patient's allergies indicates no known allergies.  Home Medications   Current Outpatient Rx  Name  Route  Sig  Dispense  Refill  . AMOXICILLIN 500 MG PO CAPS   Oral   Take 1 capsule (500 mg total) by mouth 3 (three) times daily.   21 capsule   0   . DICLOFENAC SODIUM 75 MG PO TBEC   Oral   Take 1 tablet (75 mg total) by mouth 2 (two) times daily.   12 tablet   0   . DOXYCYCLINE HYCLATE 100 MG PO CAPS   Oral   Take 1 capsule (100 mg total) by mouth 2 (two) times daily.   14 capsule   0   . OXYCODONE-ACETAMINOPHEN 5-325 MG PO TABS   Oral   Take 1 tablet by mouth every 6 (six) hours as needed for pain.   20 tablet   0     BP 149/90  Pulse 80  Temp 98.1 F (36.7 C) (Oral)  Resp 18  Ht 5\' 5"  (1.651 m)  Wt 224 lb (101.606 kg)  BMI 37.28 kg/m2  SpO2 100%  LMP 05/31/2012  Physical Exam  Nursing notes reviewed.  Electronic medical record reviewed. VITAL SIGNS:   Filed Vitals:   06/09/12 0218  BP: 149/90  Pulse: 80  Temp: 98.1 F (36.7 C)  TempSrc: Oral  Resp: 18  Height: 5\' 5"  (1.651 m)  Weight: 224 lb (101.606 kg)  SpO2: 100%   CONSTITUTIONAL: Awake, oriented, appears non-toxic HENT: Atraumatic, normocephalic, oral mucosa pink and moist, airway patent. Nares patent without drainage. External ears normal. EYES: Conjunctiva clear, EOMI, PERRLA NECK: Trachea midline, non-tender, supple CARDIOVASCULAR: Normal heart rate, Normal rhythm, No murmurs, rubs, gallops PULMONARY/CHEST: Clear to auscultation, no rhonchi, wheezes, or rales. Symmetrical breath sounds. Non-tender. ABDOMINAL: Non-distended, soft, obese, non-tender - no rebound or guarding.  BS normal.  Back: Tenderness to palpation in the paraspinous  muscles surrounding L5 on the right at S1, into the right upper buttock.   NEUROLOGIC: Non-focal, moving all four extremities, no gross sensory or motor deficits.Patellar reflexes are 2+ bilaterally, one beat of clonus bilaterally, Achilles reflexes are 1+ bilaterally.   EXTREMITIES: No clubbing, cyanosis, or edema SKIN: Warm, Dry, No erythema, No rash  ED Course  Procedures (including critical care time)  Labs Reviewed  URINALYSIS, ROUTINE W REFLEX MICROSCOPIC - Abnormal; Notable for the following:    Specific Gravity, Urine <1.005 (*)     Ketones, ur TRACE (*)     All other components within normal limits  LAB REPORT - SCANNED   No results found.   1. Low back pain       MDM  Joyce [name] is a 37 y.o. female -Patient presents  with lower back pain, no signs or symptoms consistent with cord compression illness, patient is nontoxic and afebrile, she is not a drug user,-unlikely to be an epidural abscess or osteomyelitis of the spine either. Likely due to musculoskeletal issues. Patient is obese. We'll treat the patient symptomatically. Robaxin and Norco and ibuprofen. No imaging indicated at this time.  I explained the diagnosis and have given explicit precautions to return to the ER including numbness or tingling in the lower extremities, weakness, problems with urination or stool continence or any other new or worsening symptoms. The patient understands and accepts the medical plan as it's been dictated and I have answered their questions. Discharge instructions concerning home care and prescriptions have been given.  The patient is STABLE and is discharged to home in good condition.       Jones Skene, MD 06/10/12 1610

## 2013-07-02 ENCOUNTER — Encounter (HOSPITAL_COMMUNITY): Payer: Self-pay | Admitting: Emergency Medicine

## 2013-07-02 ENCOUNTER — Emergency Department (HOSPITAL_COMMUNITY)
Admission: EM | Admit: 2013-07-02 | Discharge: 2013-07-03 | Disposition: A | Discharge status: Home / Self Care | Payer: BC Managed Care – PPO | Attending: Emergency Medicine | Admitting: Emergency Medicine

## 2013-07-02 DIAGNOSIS — R109 Unspecified abdominal pain: Secondary | ICD-10-CM

## 2013-07-02 DIAGNOSIS — Z9089 Acquired absence of other organs: Secondary | ICD-10-CM | POA: Insufficient documentation

## 2013-07-02 DIAGNOSIS — Z792 Long term (current) use of antibiotics: Secondary | ICD-10-CM | POA: Insufficient documentation

## 2013-07-02 DIAGNOSIS — IMO0002 Reserved for concepts with insufficient information to code with codable children: Secondary | ICD-10-CM | POA: Insufficient documentation

## 2013-07-02 DIAGNOSIS — N898 Other specified noninflammatory disorders of vagina: Secondary | ICD-10-CM | POA: Insufficient documentation

## 2013-07-02 DIAGNOSIS — Z79899 Other long term (current) drug therapy: Secondary | ICD-10-CM | POA: Insufficient documentation

## 2013-07-02 DIAGNOSIS — R630 Anorexia: Secondary | ICD-10-CM | POA: Insufficient documentation

## 2013-07-02 DIAGNOSIS — R112 Nausea with vomiting, unspecified: Secondary | ICD-10-CM | POA: Insufficient documentation

## 2013-07-02 DIAGNOSIS — R1084 Generalized abdominal pain: Secondary | ICD-10-CM | POA: Insufficient documentation

## 2013-07-02 DIAGNOSIS — D539 Nutritional anemia, unspecified: Secondary | ICD-10-CM | POA: Insufficient documentation

## 2013-07-02 DIAGNOSIS — Z8679 Personal history of other diseases of the circulatory system: Secondary | ICD-10-CM | POA: Insufficient documentation

## 2013-07-02 DIAGNOSIS — Z3202 Encounter for pregnancy test, result negative: Secondary | ICD-10-CM | POA: Insufficient documentation

## 2013-07-02 MED ORDER — METOCLOPRAMIDE HCL 5 MG/ML IJ SOLN
10.0000 mg | Freq: Once | INTRAMUSCULAR | Status: AC
  Administered 2013-07-03: 10 mg via INTRAVENOUS
  Filled 2013-07-02: qty 2

## 2013-07-02 MED ORDER — MORPHINE SULFATE 4 MG/ML IJ SOLN
4.0000 mg | Freq: Once | INTRAMUSCULAR | Status: AC
Start: 2013-07-03 — End: 2013-07-03
  Administered 2013-07-03: 4 mg via INTRAVENOUS
  Filled 2013-07-02: qty 1

## 2013-07-02 NOTE — ED Provider Notes (Signed)
CSN: [id number]     Arrival date & time 07/02/13  2322 History  This chart was scribed for Orlie Dakin, MD by Elby Beck, ED Scribe. This patient was seen in room [facility]04/[facility]04 and the patient's care was started at 11:37 PM.  Chief Complaint  Patient presents with  . Abdominal Pain    The history is provided by the patient. No language interpreter was used.    HPI Comments: Joyce [name] is a 38 y.o. Female with a history of only migraines who presents to the Emergency Department complaining of constant, severe, "cramping, dull, 8-9/10" lower abdominal pain onset 3 days ago. She reports having associated intermittent nausea today that is worsened with eating. Nothing makes the pain better She states that there are no modifying factors for her pain.She denies any prior history of similar abdominal pain, and states that she has not had abdominal pain in years. LNMP was 05/26/13. Last BM was about 5 hours ago, and it was normal. She reports she has taken 4 Aleve without relief. She reports a surgical history of only cholecystectomy. She states that she is not on any daily medications. She states that she has no medication allergies. She reports having had 9 pregnancies- with 6 live births, 2 abortions and 1 miscarriage. She denies vaginal discharge, dysuria, fever, vomiting or any other symptoms. She does not smoke, drink or use drugs.   Past Medical History  Diagnosis Date  . Migraine    Past Surgical History  Procedure Laterality Date  . Cholecystectomy     No family history on file. History  Substance Use Topics  . Smoking status: Never Smoker   . Smokeless tobacco: Not on file  . Alcohol Use: No   OB History   Grav Para Term Preterm Abortions TAB SAB Ect Mult Living   9 6 6  0 2  1        Review of Systems  Constitutional: Positive for appetite change.  HENT: Negative.   Respiratory: Negative.   Cardiovascular: Negative.   Gastrointestinal: Positive for nausea and  abdominal pain. Negative for vomiting.  Genitourinary: Negative for dysuria and vaginal discharge.       3 days late for menses  Musculoskeletal: Negative.   Skin: Negative.   Neurological: Negative.   Psychiatric/Behavioral: Negative.   All other systems reviewed and are negative.    Allergies  Review of patient's allergies indicates no known allergies.  Home Medications   Current Outpatient Rx  Name  Route  Sig  Dispense  Refill  . ibuprofen (ADVIL,MOTRIN) 600 MG tablet   Oral   Take 1 tablet (600 mg total) by mouth every 6 (six) hours as needed for pain.   30 tablet   0   . amoxicillin (AMOXIL) 500 MG capsule   Oral   Take 1 capsule (500 mg total) by mouth 3 (three) times daily.   21 capsule   0   . doxycycline (VIBRAMYCIN) 100 MG capsule   Oral   Take 1 capsule (100 mg total) by mouth 2 (two) times daily.   14 capsule   0   . HYDROcodone-acetaminophen (NORCO/VICODIN) 5-325 MG per tablet   Oral   Take 1-2 tablets by mouth every 6 (six) hours as needed for pain.   17 tablet   0   . methocarbamol (ROBAXIN) 500 MG tablet   Oral   Take 1-2 tablets (500-1,000 mg total) by mouth 2 (two) times daily.   20 tablet   0   .  oxyCODONE-acetaminophen (PERCOCET/ROXICET) 5-325 MG per tablet   Oral   Take 1 tablet by mouth every 6 (six) hours as needed for pain.   20 tablet   0    Triage Vitals: BP 133/72  Pulse 98  Temp(Src) 98 F (36.7 C) (Oral)  Resp 24  Ht 5\' 5"  (1.651 m)  Wt 232 lb (105.235 kg)  BMI 38.61 kg/m2  SpO2 100%  LMP 05/26/2013  Physical Exam  Nursing note and vitals reviewed. Constitutional: She appears well-developed and well-nourished. No distress.  HENT:  Head: Normocephalic and atraumatic.  Eyes: Conjunctivae are normal. Pupils are equal, round, and reactive to light.  Neck: Neck supple. No tracheal deviation present. No thyromegaly present.  Cardiovascular: Normal rate and regular rhythm.   No murmur heard. Pulmonary/Chest: Effort  normal and breath sounds normal.  Abdominal: Soft. Bowel sounds are normal. She exhibits no distension. There is tenderness (mild, diffuse).  Obese  Genitourinary:  No external lesion. Slight amount of white discharge in vaginal vault. Cervical os closed. No cervical motion tenderness. No adnexal masses or tenderness.  Musculoskeletal: Normal range of motion. She exhibits no edema and no tenderness.  Neurological: She is alert. Coordination normal.  Skin: Skin is warm and dry. No rash noted.  Psychiatric: She has a normal mood and affect.    ED  Course  Procedures (including critical care time)  12:30 AM patient states pain is somewhat improved after treatment with intravenous more seen in [facility] however she continues to complain of abdominal pain requesting more pain medicine and nowappears mildly agitated. Agitation is suggestive of akathisia likely secondary Reglan. Benadryl and additional morphine ordered . 1:12 AM feels  improved after treatment with Benadryl and additional morphine.  1:45 AM vomited. She felt much improved after vomiting.  3:20 AM patient is asymptomatic, pain-free, no longer nauseated. She is able to drink ice water without nausea or vomiting. She is not lightheaded on standing. Feels ready to go home. DIAGNOSTIC STUDIES: Oxygen Saturation is 100% on RA, normal  by my interpretation.    COORDINATION OF CARE: 11:45 PM- Pt has a ride home. Pt advised of plan for treatment and pt agrees.  Labs Review Labs Reviewed  COMPREHENSIVE METABOLIC PANEL - Abnormal; Notable for the following:    Sodium 135 (*)    Glucose, Bld 100 (*)    Calcium 8.1 (*)    All other components within normal limits  GC/CHLAMYDIA PROBE AMP  WET PREP, GENITAL  CBC WITH DIFFERENTIAL  URINALYSIS, ROUTINE W REFLEX MICROSCOPIC  RPR  HIV ANTIBODY (ROUTINE TESTING)  POCT PREGNANCY, URINE   Imaging Review No results found.  EKG Interpretation   None      Results for orders placed  during the hospital encounter of 07/02/13  WET PREP, GENITAL      Result Value Range   Yeast Wet Prep HPF POC NONE SEEN  NONE SEEN   Trich, Wet Prep NONE SEEN  NONE SEEN   Clue Cells Wet Prep HPF POC NONE SEEN  NONE SEEN   WBC, Wet Prep HPF POC FEW (*) NONE SEEN  COMPREHENSIVE METABOLIC PANEL      Result Value Range   Sodium 135 (*) 137 - 147 mEq/L   Potassium 3.9  3.7 - 5.3 mEq/L   Chloride 100  96 - 112 mEq/L   CO2 20  19 - 32 mEq/L   Glucose, Bld 100 (*) 70 - 99 mg/dL   BUN 9  6 - 23 mg/dL  Creatinine, Ser 0.61  0.50 - 1.10 mg/dL   Calcium 8.1 (*) 8.4 - 10.5 mg/dL   Total Protein 8.0  6.0 - 8.3 g/dL   Albumin 3.7  3.5 - 5.2 g/dL   AST 25  0 - 37 U/L   ALT 17  0 - 35 U/L   Alkaline Phosphatase 51  39 - 117 U/L   Total Bilirubin 0.3  0.3 - 1.2 mg/dL   GFR calc non Af Amer >90  >90 mL/min   GFR calc Af Amer >90  >90 mL/min  CBC WITH DIFFERENTIAL      Result Value Range   WBC 9.4  4.0 - 10.5 K/uL   RBC 4.79  3.87 - 5.11 MIL/uL   Hemoglobin 9.4 (*) 12.0 - 15.0 g/dL   HCT 29.9 (*) 36.0 - 46.0 %   MCV 62.4 (*) 78.0 - 100.0 fL   MCH 19.6 (*) 26.0 - 34.0 pg   MCHC 31.4  30.0 - 36.0 g/dL   RDW 18.6 (*) 11.5 - 15.5 %   Platelets 325  150 - 400 K/uL   Neutrophils Relative % 76  43 - 77 %   Lymphocytes Relative 21  12 - 46 %   Monocytes Relative 3  3 - 12 %   Eosinophils Relative 0  0 - 5 %   Basophils Relative 0  0 - 1 %   Neutro Abs 7.1  1.7 - 7.7 K/uL   Lymphs Abs 2.0  0.7 - 4.0 K/uL   Monocytes Absolute 0.3  0.1 - 1.0 K/uL   Eosinophils Absolute 0.0  0.0 - 0.7 K/uL   Basophils Absolute 0.0  0.0 - 0.1 K/uL   RBC Morphology SPHEROCYTES     WBC Morphology TOXIC GRANULATION     Smear Review PLATELET COUNT CONFIRMED BY SMEAR    URINALYSIS, ROUTINE W REFLEX MICROSCOPIC      Result Value Range   Color, Urine YELLOW  YELLOW   APPearance CLEAR  CLEAR   Specific Gravity, Urine 1.025  1.005 - 1.030   pH 5.5  5.0 - 8.0   Glucose, UA NEGATIVE  NEGATIVE mg/dL   Hgb urine dipstick  NEGATIVE  NEGATIVE   Bilirubin Urine NEGATIVE  NEGATIVE   Ketones, ur NEGATIVE  NEGATIVE mg/dL   Protein, ur NEGATIVE  NEGATIVE mg/dL   Urobilinogen, UA 0.2  0.0 - 1.0 mg/dL   Nitrite NEGATIVE  NEGATIVE   Leukocytes, UA NEGATIVE  NEGATIVE  POCT PREGNANCY, URINE      Result Value Range   Preg Test, Ur NEGATIVE  NEGATIVE   Ct Abdomen Pelvis W Contrast  07/03/2013   CLINICAL DATA:  Pain and abdominal tenderness.  EXAM: CT ABDOMEN AND PELVIS WITH CONTRAST  TECHNIQUE: Multidetector CT imaging of the abdomen and pelvis was performed using the standard protocol following bolus administration of intravenous contrast.  CONTRAST:  62mL OMNIPAQUE IOHEXOL 300 MG/ML SOLN, 165mL OMNIPAQUE IOHEXOL 300 MG/ML SOLN  COMPARISON:  None.  FINDINGS: BODY WALL: Unremarkable.  LOWER CHEST: Unremarkable.  ABDOMEN/PELVIS:  Liver: No focal abnormality.  Biliary: Cholecystectomy, which likely accounts for the mild intrahepatic biliary dilatation. Current labs negative for hyperbilirubinemia.  Pancreas: Unremarkable.  Spleen: Unremarkable.  Adrenals: Unremarkable.  Kidneys and ureters: No hydronephrosis or stone.  Bladder: Unremarkable.  Reproductive: Unremarkable.  Corpus luteum noted on the left.  Bowel: No obstruction. Normal appendix.  Retroperitoneum: Prominent small bowel mesenteric nodes at the root of the mesenteric, measuring up to 13 mm in diameter. There  is mild haziness of the mesenteric fat in the same region.  Peritoneum: No free fluid or gas.  Vascular: No acute abnormality.  OSSEOUS: No acute abnormalities.  IMPRESSION: 1. No acute intra-abdominal disease. 2. Prominent small bowel nodes. This nonspecific finding could be incidental or reflect an enteritis/adenitis.   Electronically Signed   By: Jorje Guild M.D.   On: 07/03/2013 02:52     MDM  No diagnosis found. Pain is felt to be nonspecific. Microcytic anemia is chronic Plan clear liquid diet for the next 12 hours. Prescription ferrous sulfate. Referral  to [facility] refill to get primary care physician Diagnosis #1 nonspecific abdominal pain #2 microcytic anemia    I personally performed the services described in this documentation, which was scribed in my presence. The recorded information has been reviewed and considered.   Orlie Dakin, MD 07/03/13 [phone]

## 2013-07-02 NOTE — ED Notes (Signed)
My stomach is really bothering me, I feel nauseated but I have not vomited. Started on Saturday. I do not feel like eating per pt. I have tried to eat and could not eat it per pt.

## 2013-07-03 ENCOUNTER — Emergency Department (HOSPITAL_COMMUNITY): Payer: BC Managed Care – PPO

## 2013-07-03 LAB — COMPREHENSIVE METABOLIC PANEL
ALT: 17 U/L (ref 0–35)
AST: 25 U/L (ref 0–37)
Albumin: 3.7 g/dL (ref 3.5–5.2)
Alkaline Phosphatase: 51 U/L (ref 39–117)
BUN: 9 mg/dL (ref 6–23)
CALCIUM: 8.1 mg/dL — AB (ref 8.4–10.5)
CO2: 20 meq/L (ref 19–32)
CREATININE: 0.61 mg/dL (ref 0.50–1.10)
Chloride: 100 mEq/L (ref 96–112)
GLUCOSE: 100 mg/dL — AB (ref 70–99)
Potassium: 3.9 mEq/L (ref 3.7–5.3)
SODIUM: 135 meq/L — AB (ref 137–147)
Total Bilirubin: 0.3 mg/dL (ref 0.3–1.2)
Total Protein: 8 g/dL (ref 6.0–8.3)

## 2013-07-03 LAB — URINALYSIS, ROUTINE W REFLEX MICROSCOPIC
BILIRUBIN URINE: NEGATIVE
Glucose, UA: NEGATIVE mg/dL
Hgb urine dipstick: NEGATIVE
KETONES UR: NEGATIVE mg/dL
Leukocytes, UA: NEGATIVE
NITRITE: NEGATIVE
Protein, ur: NEGATIVE mg/dL
Specific Gravity, Urine: 1.025 (ref 1.005–1.030)
UROBILINOGEN UA: 0.2 mg/dL (ref 0.0–1.0)
pH: 5.5 (ref 5.0–8.0)

## 2013-07-03 LAB — CBC WITH DIFFERENTIAL/PLATELET
BASOS ABS: 0 10*3/uL (ref 0.0–0.1)
BASOS PCT: 0 % (ref 0–1)
EOS ABS: 0 10*3/uL (ref 0.0–0.7)
Eosinophils Relative: 0 % (ref 0–5)
HCT: 29.9 % — ABNORMAL LOW (ref 36.0–46.0)
HEMOGLOBIN: 9.4 g/dL — AB (ref 12.0–15.0)
LYMPHS PCT: 21 % (ref 12–46)
Lymphs Abs: 2 10*3/uL (ref 0.7–4.0)
MCH: 19.6 pg — ABNORMAL LOW (ref 26.0–34.0)
MCHC: 31.4 g/dL (ref 30.0–36.0)
MCV: 62.4 fL — ABNORMAL LOW (ref 78.0–100.0)
Monocytes Absolute: 0.3 10*3/uL (ref 0.1–1.0)
Monocytes Relative: 3 % (ref 3–12)
NEUTROS PCT: 76 % (ref 43–77)
Neutro Abs: 7.1 10*3/uL (ref 1.7–7.7)
Platelets: 325 10*3/uL (ref 150–400)
RBC: 4.79 MIL/uL (ref 3.87–5.11)
RDW: 18.6 % — AB (ref 11.5–15.5)
WBC: 9.4 10*3/uL (ref 4.0–10.5)

## 2013-07-03 LAB — RPR: RPR Ser Ql: NONREACTIVE

## 2013-07-03 LAB — WET PREP, GENITAL
Clue Cells Wet Prep HPF POC: NONE SEEN
TRICH WET PREP: NONE SEEN
Yeast Wet Prep HPF POC: NONE SEEN

## 2013-07-03 LAB — HIV ANTIBODY (ROUTINE TESTING W REFLEX): HIV: NONREACTIVE

## 2013-07-03 LAB — POCT PREGNANCY, URINE: Preg Test, Ur: NEGATIVE

## 2013-07-03 IMAGING — CT CT ABD-PELV W/ CM
2 of 3 series · 16 of 46 positions shown, 18 images · IV contrast (omnipaque)
Comparison: None.

CLINICAL DATA: Pain and abdominal tenderness.

EXAM:
CT ABDOMEN AND PELVIS WITH CONTRAST
TECHNIQUE: Multidetector CT imaging of the abdomen and pelvis was performed
using the standard protocol following bolus administration of
intravenous contrast.
CONTRAST:  50mL OMNIPAQUE IOHEXOL 300 MG/ML SOLN, 100mL OMNIPAQUE
IOHEXOL 300 MG/ML SOLN

[Series 2: abd_pel_with 5.0 b40f · axial · 0.85mm/px · z∈[-486,-66]mm · 13 of 98 slices shown, 15 images]
[im 7/98  soft-tissue]
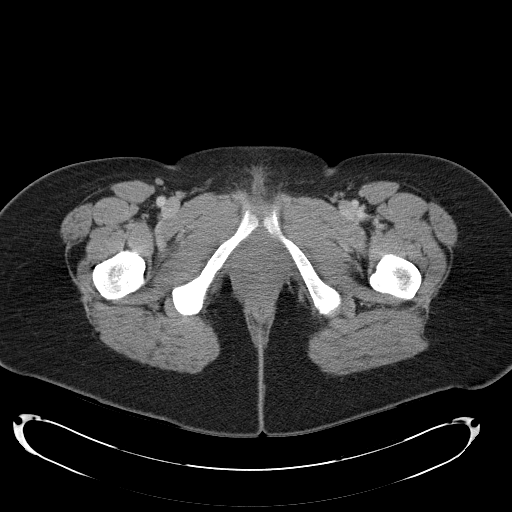
[im 7/98  bone]
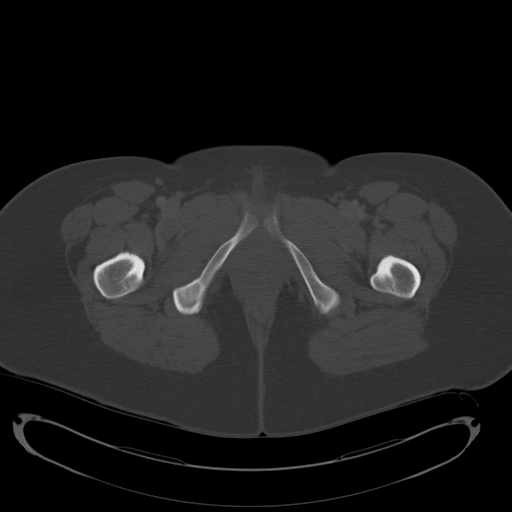
[im 13/98  soft-tissue]
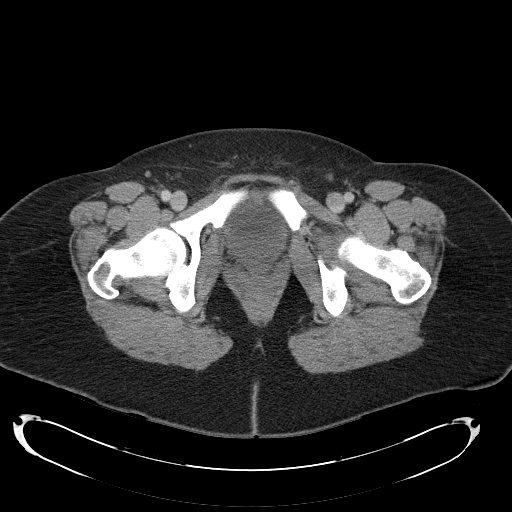
[im 19/98  soft-tissue]
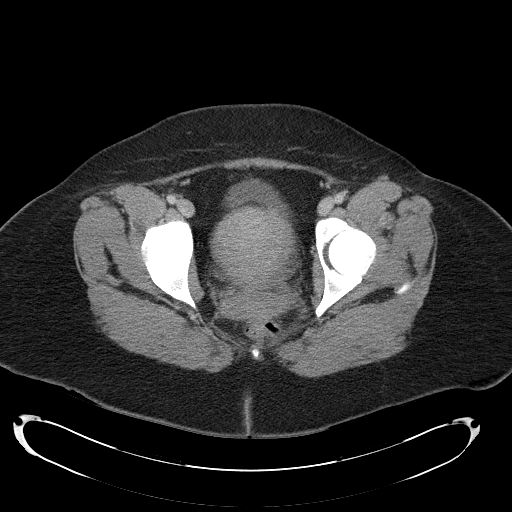
[im 29/98  soft-tissue]
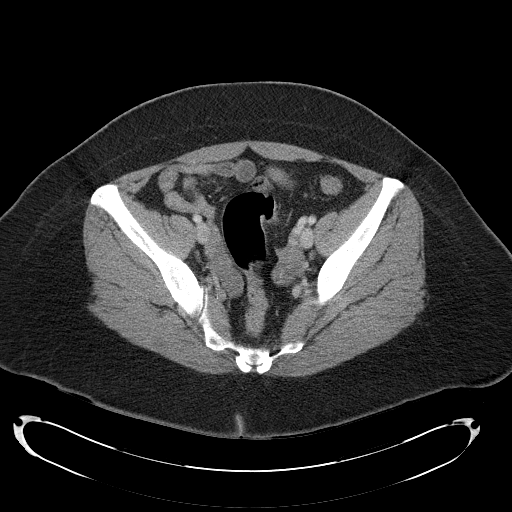
[im 35/98  soft-tissue]
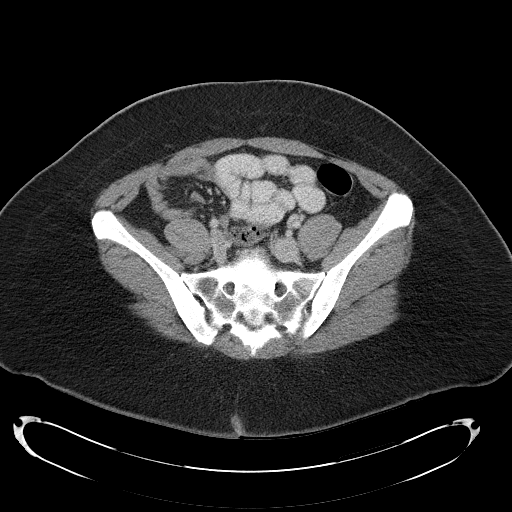
[im 41/98  soft-tissue]
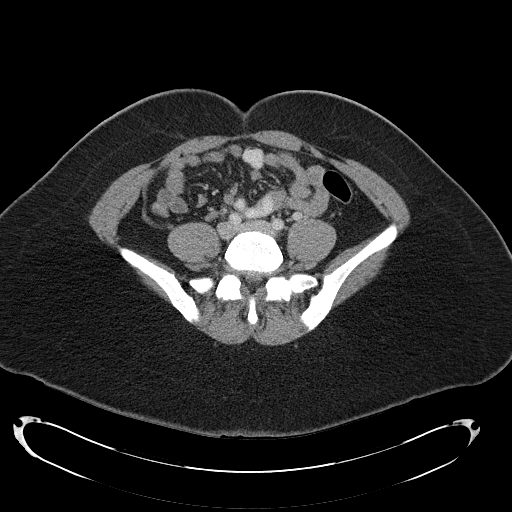
[im 51/98  soft-tissue]
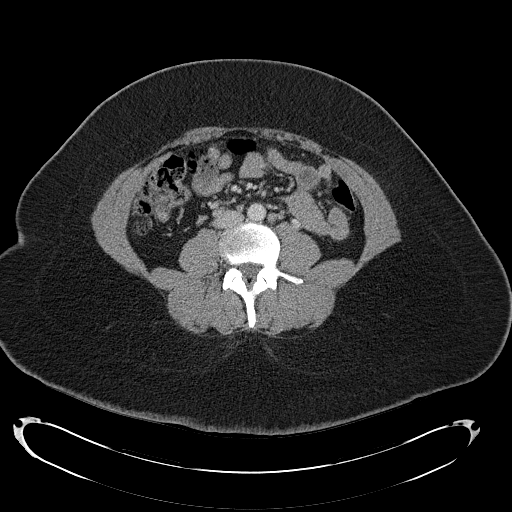
[im 57/98  soft-tissue]
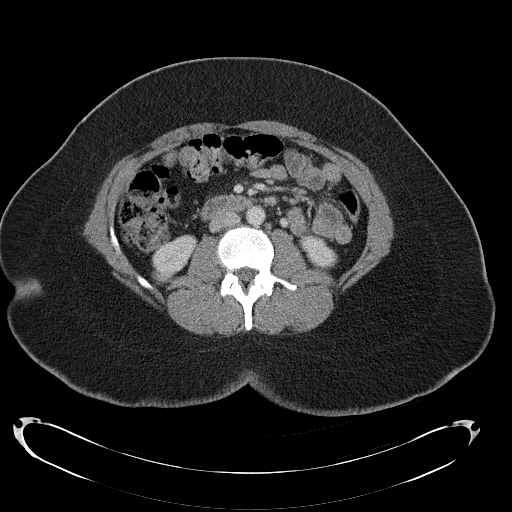
[im 63/98  soft-tissue]
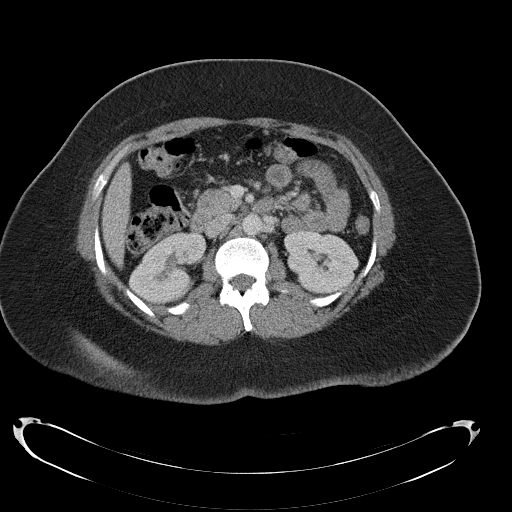
[im 63/98  bone]
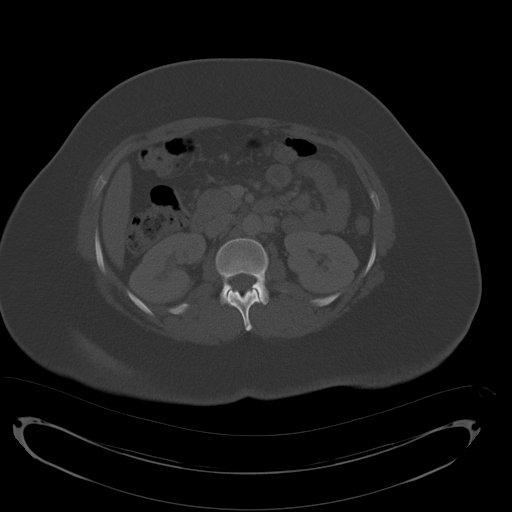
[im 69/98  soft-tissue]
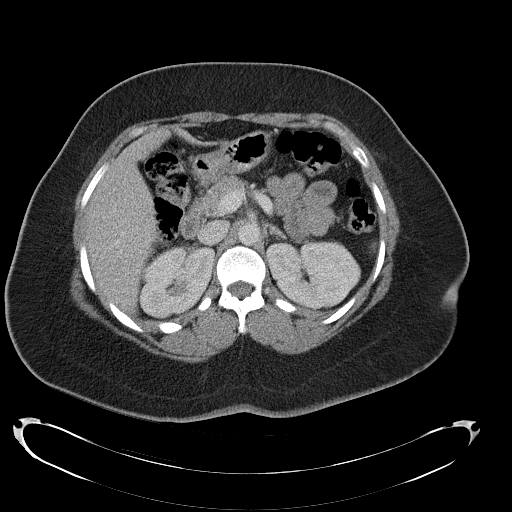
[im 79/98  soft-tissue]
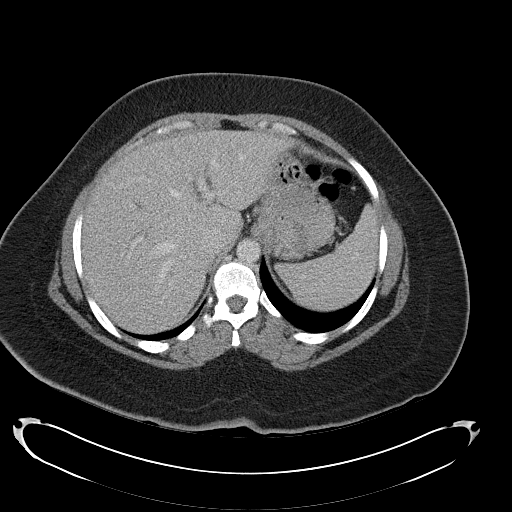
[im 85/98  soft-tissue]
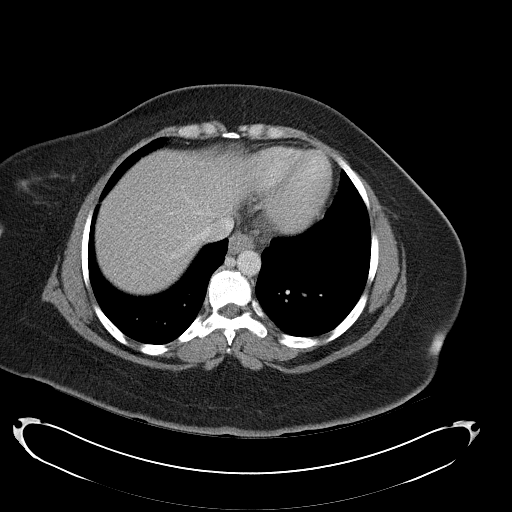
[im 91/98  soft-tissue]
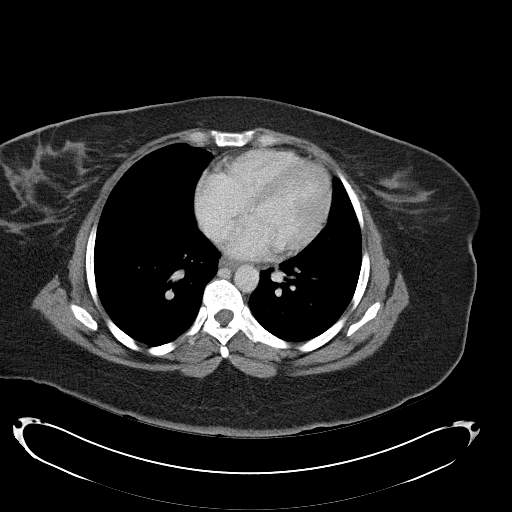

[Series 4: abd_pel_with 3.0 spo cor · coronal · 0.64mm/px · 3 of 85 slices shown]
[im 29/85  soft-tissue]
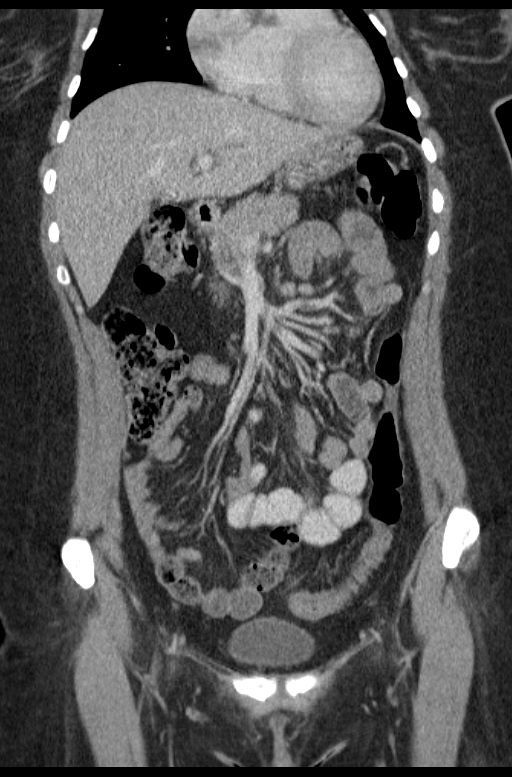
[im 38/85  soft-tissue]
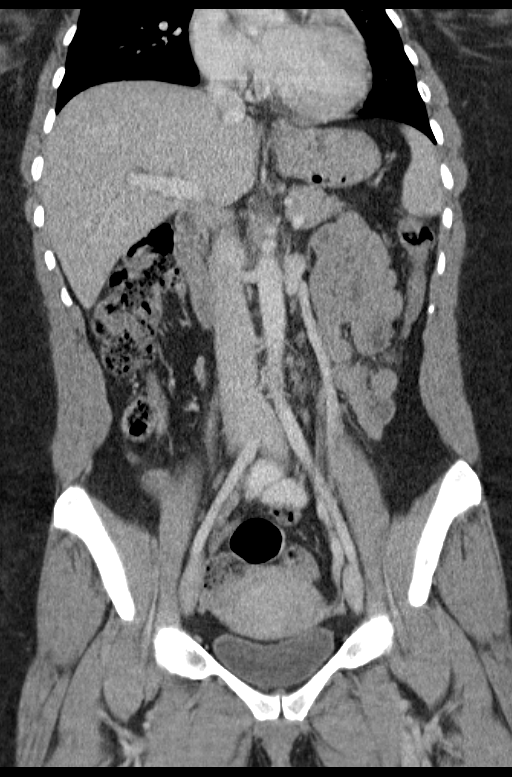
[im 47/85  soft-tissue]
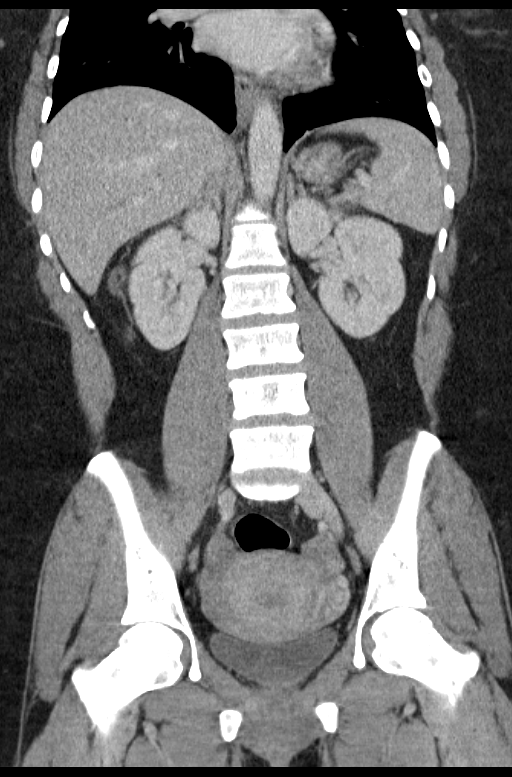

[16 of 46 positions shown; findings below may reference images not displayed]

FINDINGS: BODY WALL: Unremarkable.

LOWER CHEST: Unremarkable.

ABDOMEN/PELVIS:

Liver: No focal abnormality.

Biliary: Cholecystectomy, which likely accounts for the mild
intrahepatic biliary dilatation. Current labs negative for
hyperbilirubinemia.

Pancreas: Unremarkable.

Spleen: Unremarkable.

Adrenals: Unremarkable.

Kidneys and ureters: No hydronephrosis or stone.

Bladder: Unremarkable.

Reproductive: Unremarkable.  Corpus luteum noted on the left.

Bowel: No obstruction. Normal appendix.

Retroperitoneum: Prominent small bowel mesenteric nodes at the root
of the mesenteric, measuring up to 13 mm in diameter. There is mild
haziness of the mesenteric fat in the same region.

Peritoneum: No free fluid or gas.

Vascular: No acute abnormality.

OSSEOUS: No acute abnormalities.
IMPRESSION: 1. No acute intra-abdominal disease.
2. Prominent small bowel nodes. This nonspecific finding could be
incidental or reflect an enteritis/adenitis.

## 2013-07-03 MED ORDER — MORPHINE SULFATE 4 MG/ML IJ SOLN
4.0000 mg | Freq: Once | INTRAMUSCULAR | Status: AC
  Administered 2013-07-03: 4 mg via INTRAVENOUS
  Filled 2013-07-03: qty 1

## 2013-07-03 MED ORDER — FERROUS SULFATE 325 (65 FE) MG PO TABS: 325.0000 mg | ORAL_TABLET | Freq: Every day | ORAL | Status: DC

## 2013-07-03 MED ORDER — IOHEXOL 300 MG/ML  SOLN
100.0000 mL | Freq: Once | INTRAMUSCULAR | Status: AC | PRN
  Administered 2013-07-03: 100 mL via INTRAVENOUS

## 2013-07-03 MED ORDER — DIPHENHYDRAMINE HCL 50 MG/ML IJ SOLN
50.0000 mg | Freq: Once | INTRAMUSCULAR | Status: AC
  Administered 2013-07-03: 50 mg via INTRAVENOUS
  Filled 2013-07-03: qty 1

## 2013-07-03 MED ORDER — IOHEXOL 300 MG/ML  SOLN
50.0000 mL | Freq: Once | INTRAMUSCULAR | Status: AC | PRN
Start: 2013-07-03 — End: 2013-07-03
  Administered 2013-07-03: 50 mL via ORAL

## 2013-07-03 NOTE — Discharge Instructions (Signed)
Abdominal Pain, Adult Stay on clear liquids for the next 12 hours. Return if your pain is worse or if concern for any reason. Your lab tests show that you are anemic. Please start taking the iron tablets prescribed on 07/04/2013. Call the health department in [place] or any of the numbers on the resource guide to arrange to get primary care physician. You should get your blood count rechecked in a month. Many things can cause belly (abdominal) pain. Most times, the belly pain is not dangerous. Many cases of belly pain can be watched and treated at home. HOME CARE   Do not take medicines that help you go poop (laxatives) unless told to by your doctor.  Only take medicine as told by your doctor.  Eat or drink as told by your doctor. Your doctor will tell you if you should be on a special diet. GET HELP IF:  You do not know what is causing your belly pain.  You have belly pain while you are sick to your stomach (nauseous) or have runny poop (diarrhea).  You have pain while you pee or poop.  Your belly pain wakes you up at night.  You have belly pain that gets worse or better when you eat.  You have belly pain that gets worse when you eat fatty foods. GET HELP RIGHT AWAY IF:   The pain does not go away within 2 hours.  You have a fever.  You keep throwing up (vomiting).  The pain changes and is only in the right or left part of the belly.  You have bloody or tarry looking poop. MAKE SURE YOU:   Understand these instructions.  Will watch your condition.  Will get help right away if you are not doing well or get worse. Document Released: 11/02/2007 Document Revised: 03/06/2013 Document Reviewed: 01/23/2013 [facility] Patient Information 2014 [place]. Clear Liquid Diet The clear liquid diet is made up of foods that are liquid or become liquid at room temperature. You should be able to see through the liquid. HOME CARE  You may have any of the following:  Strained  vegetable or fruit juice (no pulp).  Clear broth or broth-based soups.  Sugar and honey.  High protein gelatin.  Flavored gelatin and ices.  Frozen ice pops without milk.  Coffee and tea.  Bubbly (carbonated) drinks only if your doctor approves.  Salt.  Nutritional drinks that you can see through. Avoid:  Milk or other dairy drinks.  Any soups besides those that contain only broth.  Desserts except for sugar, honey, gelatin, and frozen ice pops (without milk).  Fats and oils like butter, margarine, mayonnaise, or cooking oils.  Supplements that have lactose or fiber in them, like nutrition drinks. MAKE SURE YOU:  Understand these instructions.  Will watch your condition.  Will get help right away if you are not doing well or get worse. Document Released: 04/28/2008 Document Revised: 03/06/2013 Document Reviewed: 12/05/2012 [facility] Patient Information 2014 [place].  Emergency Department Resource Guide 1) Find a Doctor and Pay Out of Pocket Although you won't have to find out who is covered by your insurance plan, it is a good idea to ask around and get recommendations. You will then need to call the office and see if the doctor you have chosen will accept you as a new patient and what types of options they offer for patients who are self-pay. Some doctors offer discounts or will set up payment plans for their patients who  do not have insurance, but you will need to ask so you aren't surprised when you get to your appointment.  2) Contact Your Local Health Department Not all health departments have doctors that can see patients for sick visits, but many do, so it is worth a call to see if yours does. If you don't know where your local health department is, you can check in your phone book. The CDC also has a tool to help you locate your state's health department, and many state websites also have listings of all of their local health departments.  3) Find a  [facility] If your illness is not likely to be very severe or complicated, you may want to try a walk in clinic. These are popping up all over the country in pharmacies, drugstores, and shopping centers. They're usually staffed by nurse practitioners or physician assistants that have been trained to treat common illnesses and complaints. They're usually fairly quick and inexpensive. However, if you have serious medical issues or chronic medical problems, these are probably not your best option.  No Primary Care Doctor: - Call Health Connect at  [phone] - they can help you locate a primary care doctor that  accepts your insurance, provides certain services, etc. - Physician Referral Service- [phone]  Chronic Pain Problems: Organization         Address  Phone   Notes  [facility]  [phone] Patients need to be referred by their primary care doctor.   Medication Assistance: Organization         Address  Phone   Notes  [facility] Medication [facility] [address] [place], [place] [address] [phone] --Must be a resident of [facility] -- Must have NO insurance coverage whatsoever (no Medicaid/ Medicare, etc.) -- The pt. MUST have a primary care doctor that directs their care regularly and follows them in the community   Med[facility]ist  [phone]   [employer]  6397740371    Agencies that provide inexpensive medical care: Organization         Address  Phone   Notes  [place]  [phone]   [facility]    [phone]   [facility] [place], [place] [address] [phone]   [place] [address], [place] [phone]   Planned Parenthood    [phone]   [facility]    [phone]   [place] and [address], [place] Phone:  [phone], Fax:  [phone] Hours of Operation:  9 am - 6 pm, M-F.  Also accepts Medicaid/Medicare and self-pay.  [facility] for [address], [address], [place] Phone: [phone], Fax: [phone]. Hours of Operation:  8:30 am - 5:30 pm, M-F.  Also accepts Medicaid and self-pay.  [facility] [place] [address] [place] Phone: [phone]   [place], [place], [place] [phone], Ext. [phone] Mondays & Thursdays: 7-9 AM.  First 15 patients are seen on a first come, first serve basis.    [place] Providers:  Organization         Address  Phone   Notes  [facility] [address], [place] [phone] Also accepts self-pay patients.  [facility]  [place], [place]  [phone]   [place], [address], [place] [phone]   [facility] [address], [place] [phone]   Lucianne Lei [address], [place]   [phone] Only accepts [place] Access [place] patients after they have their name applied to their card.   Self-Pay (no insurance) in [facility]:  Organization         Address  Phone   Notes  Sickle Cell Patients, [facility] Internal [facility] [place] [phone]   [facility] [place] [phone]   [facility] [place]  [place], [place], [place] [phone]   [facility]/Dr. Osei-Bonsu  [address] [place] or [address], [place] [phone] Phone number for both [place] and [place] locations is the same.  Urgent Medical and [facility] [address] [place] [phone]   [facility] [address], [place] or [address] [phone]     [facility] [address], [place] [phone], phone; [phone], fax Sees patients 1st and 3rd Saturday of every month.  Must not qualify for public or private insurance (i.e. Medicaid, Medicare, [place] Health Choice, Veterans' Benefits)  Household income should be no more than 200% of the poverty level The clinic cannot treat you if you are pregnant or think you are pregnant  Sexually transmitted diseases are not treated at the clinic.    Dental Care: Organization         Address  Phone  Notes  [facility] Department of [facility] [place] [phone] Accepts children up to age 29 who are enrolled in [place] or [place]; pregnant women with a Medicaid card; and children who have applied for Medicaid or [place] Health Choice, but were declined, whose parents can pay a reduced fee at time of service.  [facility] Department of [facility]  [address], [place] [phone] Accepts children up to age 54 who are enrolled in [place] or [place]; pregnant women with a Medicaid card; and children who have applied for Medicaid or [place] Health Choice, but were declined, whose parents can pay a reduced fee at time of service.  [place] Adult Dental Access PROGRAM  [place] [phone] Patients are seen by appointment only. Walk-ins are not accepted. [place] will see patients 49 years of age and older. Monday - Tuesday (8am-5pm) Most Wednesdays (8:30-5pm) $30 per visit, cash only  [facility] Adult Dental Access PROGRAM  [address], [facility] [phone] Patients are seen by appointment only. Walk-ins are not accepted. [place] will see patients 62 years of age and older. One Wednesday Evening (Monthly: Volunteer Based).  $30 per visit, cash only  [place]  [phone] for adults; Children under age 27, call  Graduate Pediatric Dentistry at [phone]. Children aged 84-14, please call [phone] to request a pediatric application.  Dental services are provided in all areas of dental care including fillings, crowns and bridges, complete and partial dentures, implants, gum treatment, root canals, and extractions. Preventive care is also provided. Treatment is provided to both adults and children.  Patients are selected via a lottery and there is often a waiting list.   [facility] [address] [place]  [phone] [email or web address]   [facility] [address] [place], [place] [phone], Ext. [phone] Second and Fourth Thursday of each month, opens at 6:30 AM; Clinic ends at 9 AM.  Patients are seen on a first-come first-served basis, and a limited number are seen during each clinic.   [facility]  [address] [place] Danker [place], [place] [phone]   Eligibility Requirements You must have lived in [place], [place], or [place] counties for at least the last three months.   You cannot be eligible for state or federal sponsored [employer], including [employer], [place], or [employer].   You generally cannot be eligible for healthcare insurance through your employer.    How to apply: Eligibility screenings are held every Tuesday and Wednesday afternoon from 1:00 pm until 4:00 pm. You do not need an appointment for the interview!  [facility] [address], [place], [place]   [place]  [place] Department  [place]  [phone]    Behavioral Health Resources in the Community: Intensive Outpatient Programs Organization         Address  Phone  Notes  [place]. [address] [place], [place] [phone]   [facility] Outpatient [address] [place], [place]   ADS: Alcohol & Drug Svcs [address], [place], [place]   [place] [address]  [place], [place] or [phone]   Substance Abuse Resources Organization         Address  Phone  Notes  Alcohol and Drug Services  [phone]   [place]  [phone]   [facility]   Chinita Pester  [phone]   Residential & Outpatient Substance Abuse Program  [phone]   Psychological Services Organization         Address  Phone  Notes  [facility] [place]  [place]  [phone]   [place] [address], [place] or [phone]    Mobile Crisis Teams Organization         Address  Phone  Notes  [facility], Mobile Crisis Care Unit  [phone]   [facility] Psychotherapeutic Services  [address] [place], [place]   [place] Levels [address] [place] [phone]    Self-Help/Support Groups Organization         Address  Phone             Notes  [place]. of [place] - variety of support groups  [place] Call for more information  Narcotics Anonymous (NA), [facility] [address], [employer] [place]  2 meetings at this location   Special educational needs teacher         Address  Phone  Notes  ASAP Residential Treatment [place],    [place]  [phone]   [facility]  [address] [place] [phone], [place], [place]   [place], [place] [phone] Admissions: 8am-3pm M-F  Incentives Substance [place] [address],    [place], [place]   [facility] [place]  [address] [place], [place]   [facility] [address]  [place], [place]   [facility] - Intensive Outpatient [address]een [address], [place], [place]   [facility] ([place] [address]  [place], [place] [phone] or [phone]   [facility] Services (RTS) [address] [place], [place] Accepts Medicaid  [facility] [place] [address],  [place] [phone] Substance Abuse/Addiction Treatment   [facility] [place] Organization         Address  Phone  Notes  [facility]  [phone]   Domenic Schwab, PhD [address] Arlis [place], [place]   [phone] or [phone]   [place], [place] [phone]   [address], [place], [place] [phone] Insurance/Medicaid/sponsorship through [facility] and Families [address], [address] [place]                                    [place], [place] [phone] [place] [address], [place] [phone]    Dr. Adele Schilder  [phone]   [facility] Dept. 1) [address] [place] 2) [place] 3)  [place] [address], [place] [phone]  [phone]   [place] [phone] or [phone] (After Hours)

## 2013-07-04 ENCOUNTER — Encounter (HOSPITAL_COMMUNITY): Payer: Self-pay | Admitting: Emergency Medicine

## 2013-07-04 ENCOUNTER — Emergency Department (HOSPITAL_COMMUNITY)
Admission: EM | Admit: 2013-07-04 | Discharge: 2013-07-04 | Disposition: A | Discharge status: Home / Self Care | Payer: BC Managed Care – PPO | Attending: Emergency Medicine | Admitting: Emergency Medicine

## 2013-07-04 DIAGNOSIS — R51 Headache: Secondary | ICD-10-CM | POA: Insufficient documentation

## 2013-07-04 DIAGNOSIS — R1084 Generalized abdominal pain: Secondary | ICD-10-CM | POA: Insufficient documentation

## 2013-07-04 DIAGNOSIS — Z3202 Encounter for pregnancy test, result negative: Secondary | ICD-10-CM | POA: Insufficient documentation

## 2013-07-04 DIAGNOSIS — R197 Diarrhea, unspecified: Secondary | ICD-10-CM | POA: Insufficient documentation

## 2013-07-04 DIAGNOSIS — R109 Unspecified abdominal pain: Secondary | ICD-10-CM

## 2013-07-04 DIAGNOSIS — R630 Anorexia: Secondary | ICD-10-CM | POA: Insufficient documentation

## 2013-07-04 DIAGNOSIS — Z8679 Personal history of other diseases of the circulatory system: Secondary | ICD-10-CM | POA: Insufficient documentation

## 2013-07-04 DIAGNOSIS — Z792 Long term (current) use of antibiotics: Secondary | ICD-10-CM | POA: Insufficient documentation

## 2013-07-04 DIAGNOSIS — Z9089 Acquired absence of other organs: Secondary | ICD-10-CM | POA: Insufficient documentation

## 2013-07-04 DIAGNOSIS — R112 Nausea with vomiting, unspecified: Secondary | ICD-10-CM | POA: Insufficient documentation

## 2013-07-04 DIAGNOSIS — Z79899 Other long term (current) drug therapy: Secondary | ICD-10-CM | POA: Insufficient documentation

## 2013-07-04 LAB — CBC
HEMATOCRIT: 34.4 % — AB (ref 36.0–46.0)
HEMOGLOBIN: 10.6 g/dL — AB (ref 12.0–15.0)
MCH: 19.1 pg — ABNORMAL LOW (ref 26.0–34.0)
MCHC: 30.8 g/dL (ref 30.0–36.0)
MCV: 62.1 fL — AB (ref 78.0–100.0)
Platelets: 374 10*3/uL (ref 150–400)
RBC: 5.54 MIL/uL — AB (ref 3.87–5.11)
RDW: 18.7 % — AB (ref 11.5–15.5)
WBC: 8.2 10*3/uL (ref 4.0–10.5)

## 2013-07-04 LAB — PREGNANCY, URINE: PREG TEST UR: NEGATIVE

## 2013-07-04 LAB — URINALYSIS, ROUTINE W REFLEX MICROSCOPIC
Bilirubin Urine: NEGATIVE
Glucose, UA: NEGATIVE mg/dL
Ketones, ur: NEGATIVE mg/dL
Leukocytes, UA: NEGATIVE
Nitrite: NEGATIVE
PH: 6 (ref 5.0–8.0)
Urobilinogen, UA: 0.2 mg/dL (ref 0.0–1.0)

## 2013-07-04 LAB — COMPREHENSIVE METABOLIC PANEL
ALBUMIN: 3.4 g/dL — AB (ref 3.5–5.2)
ALK PHOS: 82 U/L (ref 39–117)
ALT: 93 U/L — ABNORMAL HIGH (ref 0–35)
AST: 55 U/L — ABNORMAL HIGH (ref 0–37)
BUN: 6 mg/dL (ref 6–23)
CALCIUM: 8.6 mg/dL (ref 8.4–10.5)
CO2: 23 mEq/L (ref 19–32)
Chloride: 102 mEq/L (ref 96–112)
Creatinine, Ser: 0.64 mg/dL (ref 0.50–1.10)
GFR calc Af Amer: >90 mL/min (ref >90)
GFR calc non Af Amer: >90 mL/min (ref >90)
Glucose, Bld: 88 mg/dL (ref 70–99)
POTASSIUM: 3.3 meq/L — AB (ref 3.7–5.3)
SODIUM: 138 meq/L (ref 137–147)
TOTAL PROTEIN: 7.9 g/dL (ref 6.0–8.3)
Total Bilirubin: 0.2 mg/dL — ABNORMAL LOW (ref 0.3–1.2)

## 2013-07-04 LAB — GC/CHLAMYDIA PROBE AMP
CT Probe RNA: NEGATIVE
GC PROBE AMP APTIMA: NEGATIVE

## 2013-07-04 LAB — URINE MICROSCOPIC-ADD ON

## 2013-07-04 MED ORDER — ONDANSETRON HCL 4 MG PO TABS: 4.0000 mg | ORAL_TABLET | Freq: Four times a day (QID) | ORAL | Status: DC

## 2013-07-04 MED ORDER — SODIUM CHLORIDE 0.9 % IV BOLUS (SEPSIS)
1000.0000 mL | Freq: Once | INTRAVENOUS | Status: AC
  Administered 2013-07-04: 1000 mL via INTRAVENOUS

## 2013-07-04 MED ORDER — MORPHINE SULFATE 4 MG/ML IJ SOLN
4.0000 mg | Freq: Once | INTRAMUSCULAR | Status: AC
  Administered 2013-07-04: 4 mg via INTRAVENOUS
  Filled 2013-07-04: qty 1

## 2013-07-04 MED ORDER — DICYCLOMINE HCL 10 MG/ML IM SOLN
20.0000 mg | Freq: Once | INTRAMUSCULAR | Status: AC
  Administered 2013-07-04: 20 mg via INTRAMUSCULAR
  Filled 2013-07-04: qty 2

## 2013-07-04 MED ORDER — DIPHENOXYLATE-ATROPINE 2.5-0.025 MG PO TABS: 2.0000 | ORAL_TABLET | Freq: Four times a day (QID) | ORAL | Status: DC | PRN

## 2013-07-04 MED ORDER — DICYCLOMINE HCL 20 MG PO TABS: 20.0000 mg | ORAL_TABLET | Freq: Two times a day (BID) | ORAL | Status: DC

## 2013-07-04 MED ORDER — DIPHENOXYLATE-ATROPINE 2.5-0.025 MG PO TABS
2.0000 | ORAL_TABLET | Freq: Once | ORAL | Status: AC
  Administered 2013-07-04: 2 via ORAL
  Filled 2013-07-04: qty 2

## 2013-07-04 MED ORDER — ONDANSETRON HCL 4 MG/2ML IJ SOLN
4.0000 mg | Freq: Once | INTRAMUSCULAR | Status: AC
  Administered 2013-07-04: 4 mg via INTRAVENOUS
  Filled 2013-07-04: qty 2

## 2013-07-04 NOTE — ED Provider Notes (Signed)
CSN: [id number]     Arrival date & time 07/04/13  1119 History  This chart was scribed for Orpah Greek, MD by Roxan Diesel, ED scribe.  This patient was seen in room APA15/APA15 and the patient's care was started at 4:17 PM.   Chief Complaint  Patient presents with  . Abdominal Pain    The history is provided by the patient. No language interpreter was used.    HPI Comments: Joyce [name] is a 38 y.o. female who presents to the Emergency Department complaining of constant, severe, "cramping, dull," lower abdominal pain that began 5 days ago.  Pt also had associated nausea and vomiting but she stopped eating several days ago and this has since resolved.  Pain has no modifying factors.  Pt has no prior h/o similar pain.  She was seen 2 here days ago for the same and diagnosed with nonspecific abdominal pain without concern for emergent cause.  She states her pain has persisted since then and has not changed.  Her vomiting has resolved but she has also since developed headaches and multiple episodes of diarrhea since her last visit.  Pt has h/o cholecystectomy.  She denies any other abdominal surgeries.   Past Medical History  Diagnosis Date  . Migraine     Past Surgical History  Procedure Laterality Date  . Cholecystectomy      No family history on file.   History  Substance Use Topics  . Smoking status: Never Smoker   . Smokeless tobacco: Not on file  . Alcohol Use: No    OB History   Grav Para Term Preterm Abortions TAB SAB Ect Mult Living   9 6 6  0 2  1         Review of Systems  Constitutional: Positive for appetite change.  Gastrointestinal: Positive for vomiting (resolved), abdominal pain and diarrhea.  Neurological: Positive for headaches.  All other systems reviewed and are negative.     Allergies  Review of patient's allergies indicates no known allergies.  Home Medications   Current Outpatient Rx  Name  Route  Sig  Dispense  Refill   . amoxicillin (AMOXIL) 500 MG capsule   Oral   Take 1 capsule (500 mg total) by mouth 3 (three) times daily.   21 capsule   0   . doxycycline (VIBRAMYCIN) 100 MG capsule   Oral   Take 1 capsule (100 mg total) by mouth 2 (two) times daily.   14 capsule   0   . ferrous sulfate 325 (65 FE) MG tablet   Oral   Take 1 tablet (325 mg total) by mouth daily.   30 tablet   0   . HYDROcodone-acetaminophen (NORCO/VICODIN) 5-325 MG per tablet   Oral   Take 1-2 tablets by mouth every 6 (six) hours as needed for pain.   17 tablet   0   . ibuprofen (ADVIL,MOTRIN) 600 MG tablet   Oral   Take 1 tablet (600 mg total) by mouth every 6 (six) hours as needed for pain.   30 tablet   0   . methocarbamol (ROBAXIN) 500 MG tablet   Oral   Take 1-2 tablets (500-1,000 mg total) by mouth 2 (two) times daily.   20 tablet   0   . oxyCODONE-acetaminophen (PERCOCET/ROXICET) 5-325 MG per tablet   Oral   Take 1 tablet by mouth every 6 (six) hours as needed for pain.   20 tablet   0  BP 146/79  Pulse 91  Temp(Src) 97.9 F (36.6 C) (Oral)  Resp 18  Ht 5\' 5"  (1.651 m)  Wt 232 lb (105.235 kg)  BMI 38.61 kg/m2  SpO2 96%  LMP 05/26/2013  Physical Exam  Nursing note and vitals reviewed. Constitutional: She is oriented to person, place, and time. She appears well-developed and well-nourished. No distress.  HENT:  Head: Normocephalic and atraumatic.  Right Ear: Hearing normal.  Left Ear: Hearing normal.  Nose: Nose normal.  Mouth/Throat: Oropharynx is clear and moist and mucous membranes are normal.  Eyes: Conjunctivae and EOM are normal. Pupils are equal, round, and reactive to light.  Neck: Normal range of motion. Neck supple.  Cardiovascular: Regular rhythm, S1 normal and S2 normal.  Exam reveals no gallop and no friction rub.   No murmur heard. Pulmonary/Chest: Effort normal and breath sounds normal. No respiratory distress. She exhibits no tenderness.  Abdominal: Soft. Normal  appearance and bowel sounds are normal. There is no hepatosplenomegaly. There is tenderness (mild, diffuse). There is no rebound, no guarding, no tenderness at McBurney's point and negative Murphy's sign. No hernia.  Musculoskeletal: Normal range of motion.  Neurological: She is alert and oriented to person, place, and time. She has normal strength. No cranial nerve deficit or sensory deficit. Coordination normal. GCS eye subscore is 4. GCS verbal subscore is 5. GCS motor subscore is 6.  Skin: Skin is warm, dry and intact. No rash noted. No cyanosis.  Psychiatric: She has a normal mood and affect. Her speech is normal and behavior is normal. Thought content normal.    ED Course  Procedures (including critical care time)  DIAGNOSTIC STUDIES: Oxygen Saturation is 96% on room air, normal by my interpretation.    COORDINATION OF CARE: 4:22 PM-Informed pt that symptoms are likely due to viral infection.  Discussed treatment plan which includes pain medication with pt at bedside and pt agreed to plan.     Labs Review Labs Reviewed  COMPREHENSIVE METABOLIC PANEL - Abnormal; Notable for the following:    Potassium 3.3 (*)    Albumin 3.4 (*)    AST 55 (*)    ALT 93 (*)    Total Bilirubin 0.2 (*)    All other components within normal limits  CBC - Abnormal; Notable for the following:    RBC 5.54 (*)    Hemoglobin 10.6 (*)    HCT 34.4 (*)    MCV 62.1 (*)    MCH 19.1 (*)    RDW 18.7 (*)    All other components within normal limits  URINALYSIS, ROUTINE W REFLEX MICROSCOPIC  PREGNANCY, URINE   Imaging Review Ct Abdomen Pelvis W Contrast  07/03/2013   CLINICAL DATA:  Pain and abdominal tenderness.  EXAM: CT ABDOMEN AND PELVIS WITH CONTRAST  TECHNIQUE: Multidetector CT imaging of the abdomen and pelvis was performed using the standard protocol following bolus administration of intravenous contrast.  CONTRAST:  4mL OMNIPAQUE IOHEXOL 300 MG/ML SOLN, 174mL OMNIPAQUE IOHEXOL 300 MG/ML SOLN   COMPARISON:  None.  FINDINGS: BODY WALL: Unremarkable.  LOWER CHEST: Unremarkable.  ABDOMEN/PELVIS:  Liver: No focal abnormality.  Biliary: Cholecystectomy, which likely accounts for the mild intrahepatic biliary dilatation. Current labs negative for hyperbilirubinemia.  Pancreas: Unremarkable.  Spleen: Unremarkable.  Adrenals: Unremarkable.  Kidneys and ureters: No hydronephrosis or stone.  Bladder: Unremarkable.  Reproductive: Unremarkable.  Corpus luteum noted on the left.  Bowel: No obstruction. Normal appendix.  Retroperitoneum: Prominent small bowel mesenteric nodes at the root of  the mesenteric, measuring up to 13 mm in diameter. There is mild haziness of the mesenteric fat in the same region.  Peritoneum: No free fluid or gas.  Vascular: No acute abnormality.  OSSEOUS: No acute abnormalities.  IMPRESSION: 1. No acute intra-abdominal disease. 2. Prominent small bowel nodes. This nonspecific finding could be incidental or reflect an enteritis/adenitis.   Electronically Signed   By: Jorje Guild M.D.   On: 07/03/2013 02:52    EKG Interpretation   None       MDM  Diagnosis: 1. Abdominal pain 2. Vomiting 3. Diarrhea  His presents to the ER for continued abdominal pain. She was evaluated 2 days similar symptoms. At that time she was having nausea and vomiting. She reports that she hasn't been eating much, vomiting has resolved. She is now experiencing frequent watery diarrhea and abdominal cramping which is diffuse. She had CAT scan performed 2 days ago which was unremarkable. Patient status post cholecystectomy. Her abdominal exam is benign, no focal tenderness, no guarding or rebound. Repeat imaging is not necessary. Blood work is still normal, normal white count. Vital signs are entirely normal. Patient likely experiencing viral syndrome with abdominal cramping. Will treat symptomatically and hydrated, continue outpatient symptomatic therpapy.    I personally performed the services described  in this documentation, which was scribed in my presence. The recorded information has been reviewed and is accurate.    Orpah Greek, MD 07/04/13 [phone]

## 2013-07-04 NOTE — ED Notes (Signed)
Pt states continued abdominal pain since last visit. Also states continued diarrhea and heaving.

## 2013-07-04 NOTE — Discharge Instructions (Signed)
Abdominal Pain, Adult °Many things can cause abdominal pain. Usually, abdominal pain is not caused by a disease and will improve without treatment. It can often be observed and treated at home. Your health care provider will do a physical exam and possibly order blood tests and X-rays to help determine the seriousness of your pain. However, in many cases, more time must pass before a clear cause of the pain can be found. Before that point, your health care provider may not know if you need more testing or further treatment. °HOME CARE INSTRUCTIONS  °Monitor your abdominal pain for any changes. The following actions may help to alleviate any discomfort you are experiencing: °· Only take over-the-counter or prescription medicines as directed by your health care provider. °· Do not take laxatives unless directed to do so by your health care provider. °· Try a clear liquid diet (broth, tea, or water) as directed by your health care provider. Slowly move to a bland diet as tolerated. °SEEK MEDICAL CARE IF: °· You have unexplained abdominal pain. °· You have abdominal pain associated with nausea or diarrhea. °· You have pain when you urinate or have a bowel movement. °· You experience abdominal pain that wakes you in the night. °· You have abdominal pain that is worsened or improved by eating food. °· You have abdominal pain that is worsened with eating fatty foods. °SEEK IMMEDIATE MEDICAL CARE IF:  °· Your pain does not go away within 2 hours. °· You have a fever. °· You keep throwing up (vomiting). °· Your pain is felt only in portions of the abdomen, such as the right side or the left lower portion of the abdomen. °· You pass bloody or black tarry stools. °MAKE SURE YOU: °· Understand these instructions.   °· Will watch your condition.   °· Will get help right away if you are not doing well or get worse.   °Document Released: 02/23/2005 Document Revised: 03/06/2013 Document Reviewed: 01/23/2013 °[employer]® Patient  Information ©2014 [employer]. °Diarrhea °Diarrhea is frequent loose and watery bowel movements. It can cause you to feel weak and dehydrated. Dehydration can cause you to become tired and thirsty, have a dry mouth, and have decreased urination that often is dark yellow. Diarrhea is a sign of another problem, most often an infection that will not last long. In most cases, diarrhea typically lasts 2 3 days. However, it can last longer if it is a sign of something more serious. It is important to treat your diarrhea as directed by your caregive to lessen or prevent future episodes of diarrhea. °CAUSES  °Some common causes include: °· Gastrointestinal infections caused by viruses, bacteria, or parasites. °· Food poisoning or food allergies. °· Certain medicines, such as antibiotics, chemotherapy, and laxatives. °· Artificial sweeteners and fructose. °· Digestive disorders. °HOME CARE INSTRUCTIONS °· Ensure adequate fluid intake (hydration): have 1 cup (8 oz) of fluid for each diarrhea episode. Avoid fluids that contain simple sugars or sports drinks, fruit juices, whole milk products, and sodas. Your urine should be clear or pale yellow if you are drinking enough fluids. Hydrate with an oral rehydration solution that you can purchase at pharmacies, retail stores, and online. You can prepare an oral rehydration solution at home by mixing the following ingredients together: °·   tsp table salt. °· ¾ tsp baking soda. °·  tsp salt substitute containing potassium chloride. °· 1  tablespoons sugar. °· 1 L (34 oz) of water. °· Certain foods and beverages may increase the speed   at which food moves through the gastrointestinal (GI) tract. These foods and beverages should be avoided and include: °· Caffeinated and alcoholic beverages. °· High-fiber foods, such as raw fruits and vegetables, nuts, seeds, and whole grain breads and cereals. °· Foods and beverages sweetened with sugar alcohols, such as xylitol, sorbitol, and  mannitol. °· Some foods may be well tolerated and may help thicken stool including: °· Starchy foods, such as rice, toast, pasta, low-sugar cereal, oatmeal, grits, baked potatoes, crackers, and bagels. °· Bananas. °· Applesauce. °· Add probiotic-rich foods to help increase healthy bacteria in the GI tract, such as yogurt and fermented milk products. °· Wash your hands well after each diarrhea episode. °· Only take over-the-counter or prescription medicines as directed by your caregiver. °· Take a warm bath to relieve any burning or pain from frequent diarrhea episodes. °SEEK IMMEDIATE MEDICAL CARE IF:  °· You are unable to keep fluids down. °· You have persistent vomiting. °· You have blood in your stool, or your stools are black and tarry. °· You do not urinate in 6 8 hours, or there is only a small amount of very dark urine. °· You have abdominal pain that increases or localizes. °· You have weakness, dizziness, confusion, or lightheadedness. °· You have a severe headache. °· Your diarrhea gets worse or does not get better. °· You have a fever or persistent symptoms for more than 2 3 days. °· You have a fever and your symptoms suddenly get worse. °MAKE SURE YOU:  °· Understand these instructions. °· Will watch your condition. °· Will get help right away if you are not doing well or get worse. °Document Released: 05/06/2002 Document Revised: 05/02/2012 Document Reviewed: 01/22/2012 °ExitCare® Patient Information ©2014 [employer]. ° °

## 2013-10-01 ENCOUNTER — Encounter (HOSPITAL_COMMUNITY): Payer: Self-pay | Admitting: Emergency Medicine

## 2013-10-01 ENCOUNTER — Emergency Department (HOSPITAL_COMMUNITY)
Admission: EM | Admit: 2013-10-01 | Discharge: 2013-10-02 | Disposition: A | Discharge status: Home / Self Care | Payer: BC Managed Care – PPO | Attending: Emergency Medicine | Admitting: Emergency Medicine

## 2013-10-01 DIAGNOSIS — N760 Acute vaginitis: Secondary | ICD-10-CM | POA: Insufficient documentation

## 2013-10-01 DIAGNOSIS — Z8669 Personal history of other diseases of the nervous system and sense organs: Secondary | ICD-10-CM | POA: Insufficient documentation

## 2013-10-01 DIAGNOSIS — N730 Acute parametritis and pelvic cellulitis: Secondary | ICD-10-CM | POA: Insufficient documentation

## 2013-10-01 DIAGNOSIS — B9689 Other specified bacterial agents as the cause of diseases classified elsewhere: Secondary | ICD-10-CM | POA: Insufficient documentation

## 2013-10-01 DIAGNOSIS — Z79899 Other long term (current) drug therapy: Secondary | ICD-10-CM | POA: Insufficient documentation

## 2013-10-01 DIAGNOSIS — Z3202 Encounter for pregnancy test, result negative: Secondary | ICD-10-CM | POA: Insufficient documentation

## 2013-10-01 DIAGNOSIS — A499 Bacterial infection, unspecified: Secondary | ICD-10-CM | POA: Insufficient documentation

## 2013-10-01 DIAGNOSIS — Z9089 Acquired absence of other organs: Secondary | ICD-10-CM | POA: Insufficient documentation

## 2013-10-01 DIAGNOSIS — R109 Unspecified abdominal pain: Secondary | ICD-10-CM | POA: Insufficient documentation

## 2013-10-01 DIAGNOSIS — R110 Nausea: Secondary | ICD-10-CM | POA: Insufficient documentation

## 2013-10-01 NOTE — ED Notes (Signed)
Patient c/o thick, white vaginal discharge that started 3 days ago; with increased pain in pubic area today.

## 2013-10-02 LAB — URINALYSIS, ROUTINE W REFLEX MICROSCOPIC
Bilirubin Urine: NEGATIVE
GLUCOSE, UA: NEGATIVE mg/dL
HGB URINE DIPSTICK: NEGATIVE
Ketones, ur: NEGATIVE mg/dL
Leukocytes, UA: NEGATIVE
Nitrite: NEGATIVE
PROTEIN: NEGATIVE mg/dL
Specific Gravity, Urine: <1.005 — ABNORMAL LOW (ref 1.005–1.030)
UROBILINOGEN UA: 0.2 mg/dL (ref 0.0–1.0)
pH: 6 (ref 5.0–8.0)

## 2013-10-02 LAB — WET PREP, GENITAL
TRICH WET PREP: NONE SEEN
YEAST WET PREP: NONE SEEN

## 2013-10-02 LAB — CBC WITH DIFFERENTIAL/PLATELET
BASOS ABS: 0 10*3/uL (ref 0.0–0.1)
BASOS PCT: 0 % (ref 0–1)
Eosinophils Absolute: 0 10*3/uL (ref 0.0–0.7)
Eosinophils Relative: 0 % (ref 0–5)
HCT: 33.4 % — ABNORMAL LOW (ref 36.0–46.0)
Hemoglobin: 10.3 g/dL — ABNORMAL LOW (ref 12.0–15.0)
LYMPHS PCT: 22 % (ref 12–46)
Lymphs Abs: 2.5 10*3/uL (ref 0.7–4.0)
MCH: 18.9 pg — ABNORMAL LOW (ref 26.0–34.0)
MCHC: 30.8 g/dL (ref 30.0–36.0)
MCV: 61.4 fL — ABNORMAL LOW (ref 78.0–100.0)
MONOS PCT: 6 % (ref 3–12)
Monocytes Absolute: 0.7 10*3/uL (ref 0.1–1.0)
NEUTROS PCT: 72 % (ref 43–77)
Neutro Abs: 8.1 10*3/uL — ABNORMAL HIGH (ref 1.7–7.7)
Platelets: 382 10*3/uL (ref 150–400)
RBC: 5.44 MIL/uL — ABNORMAL HIGH (ref 3.87–5.11)
RDW: 18.6 % — AB (ref 11.5–15.5)
WBC: 11.3 10*3/uL — AB (ref 4.0–10.5)

## 2013-10-02 LAB — BASIC METABOLIC PANEL
BUN: 7 mg/dL (ref 6–23)
CO2: 25 mEq/L (ref 19–32)
Calcium: 9.3 mg/dL (ref 8.4–10.5)
Chloride: 102 mEq/L (ref 96–112)
Creatinine, Ser: 0.68 mg/dL (ref 0.50–1.10)
GFR calc Af Amer: >90 mL/min (ref >90)
Glucose, Bld: 96 mg/dL (ref 70–99)
POTASSIUM: 3.3 meq/L — AB (ref 3.7–5.3)
SODIUM: 140 meq/L (ref 137–147)

## 2013-10-02 LAB — GC/CHLAMYDIA PROBE AMP
CT Probe RNA: NEGATIVE
GC Probe RNA: NEGATIVE

## 2013-10-02 LAB — RPR

## 2013-10-02 LAB — PREGNANCY, URINE: Preg Test, Ur: NEGATIVE

## 2013-10-02 LAB — HIV ANTIBODY (ROUTINE TESTING W REFLEX): HIV: NONREACTIVE

## 2013-10-02 MED ORDER — METRONIDAZOLE 500 MG PO TABS: 500.0000 mg | ORAL_TABLET | Freq: Two times a day (BID) | ORAL | Status: DC

## 2013-10-02 MED ORDER — IBUPROFEN 800 MG PO TABS
800.0000 mg | ORAL_TABLET | Freq: Once | ORAL | Status: AC
  Administered 2013-10-02: 800 mg via ORAL
  Filled 2013-10-02: qty 1

## 2013-10-02 MED ORDER — CEFTRIAXONE SODIUM 250 MG IJ SOLR
250.0000 mg | Freq: Once | INTRAMUSCULAR | Status: AC
  Administered 2013-10-02: 250 mg via INTRAMUSCULAR
  Filled 2013-10-02: qty 250

## 2013-10-02 MED ORDER — LIDOCAINE HCL (PF) 1 % IJ SOLN
INTRAMUSCULAR | Status: AC
  Administered 2013-10-02: 5 mL
  Filled 2013-10-02: qty 5

## 2013-10-02 MED ORDER — AZITHROMYCIN 250 MG PO TABS
1000.0000 mg | ORAL_TABLET | Freq: Once | ORAL | Status: AC
  Administered 2013-10-02: 1000 mg via ORAL
  Filled 2013-10-02: qty 4

## 2013-10-02 NOTE — Discharge Instructions (Signed)

## 2013-10-02 NOTE — ED Provider Notes (Signed)
Assumed care of patient from PA-C Triplett at shift change. Patient with vaginal discharge and crampy suprapubic abdominal pain. Wet prep pending. Patient treated already with Rocephin and azithromycin for possible PID. Will treat with flagyl for BV.  Abdomen soft and nontender.   Ezequiel Essex, MD 10/02/13 [phone]

## 2013-10-02 NOTE — ED Provider Notes (Signed)
Medical screening examination/treatment/procedure(s) were conducted as a shared visit with non-physician practitioner(s) and myself.  I personally evaluated the patient during the encounter.  See my additional note   EKG Interpretation None        Ezequiel Essex, MD 10/02/13 [phone]

## 2013-10-02 NOTE — ED Provider Notes (Signed)
CSN: [id number]     Arrival date & time 10/01/13  2321 History   First MD Initiated Contact with Patient 10/01/13 2344     Chief Complaint  Patient presents with  . Vaginal Discharge     (Consider location/radiation/quality/duration/timing/severity/associated sxs/prior Treatment) Patient is a 38 y.o. female presenting with vaginal discharge. The history is provided by the patient.  Vaginal Discharge Quality:  Mucoid, white and thick Severity:  Moderate Onset quality:  Gradual Duration:  3 days Timing:  Constant Progression:  Unchanged Chronicity:  New Context: spontaneously   Relieved by:  Nothing Worsened by:  Nothing tried Ineffective treatments:  None tried Associated symptoms: abdominal pain and nausea   Associated symptoms: no dyspareunia, no dysuria, no fever, no genital lesions, no rash, no urinary frequency, no urinary incontinence, no vaginal itching and no vomiting   Associated symptoms comment:  New sexual partner x one month Abdominal pain:    Location:  Suprapubic   Quality:  Sharp   Severity:  Mild   Onset quality:  Gradual   Duration:  3 days   Timing:  Intermittent   Progression:  Waxing and waning   Chronicity:  New Risk factors: new sexual partner     Past Medical History  Diagnosis Date  . Migraine    Past Surgical History  Procedure Laterality Date  . Cholecystectomy     No family history on file. History  Substance Use Topics  . Smoking status: Never Smoker   . Smokeless tobacco: Not on file  . Alcohol Use: No   OB History   Grav Para Term Preterm Abortions TAB SAB Ect Mult Living   9 6 6  0 2  1        Review of Systems  Constitutional: Negative for fever, activity change and appetite change.  Gastrointestinal: Positive for nausea and abdominal pain. Negative for vomiting.  Genitourinary: Positive for vaginal discharge and pelvic pain. Negative for bladder incontinence, dysuria, frequency, hematuria, flank pain, decreased urine volume,  vaginal bleeding, difficulty urinating, genital sores, menstrual problem and dyspareunia.  Musculoskeletal: Negative for back pain.  Skin: Negative for rash.  All other systems reviewed and are negative.     Allergies  Review of patient's allergies indicates no known allergies.  Home Medications   Prior to Admission medications   Medication Sig Start Date End Date Taking? Authorizing Provider  dicyclomine (BENTYL) 20 MG tablet Take 1 tablet (20 mg total) by mouth 2 (two) times daily. 07/04/13   Orpah Greek, MD  diphenoxylate-atropine (LOMOTIL) 2.5-0.025 MG per tablet Take 2 tablets by mouth 4 (four) times daily as needed for diarrhea or loose stools. 07/04/13   Orpah Greek, MD  ferrous sulfate 325 (65 FE) MG tablet Take 1 tablet (325 mg total) by mouth daily. 07/03/13   Orlie Dakin, MD  Fiber, Guar Gum, CHEW Chew 2 tablets by mouth daily.    Historical Provider, MD  ibuprofen (ADVIL,MOTRIN) 600 MG tablet Take 1 tablet (600 mg total) by mouth every 6 (six) hours as needed for pain. 06/09/12   John-Adam Bonk, MD  ondansetron (ZOFRAN) 4 MG tablet Take 1 tablet (4 mg total) by mouth every 6 (six) hours. 07/04/13   Orpah Greek, MD   BP 150/80  Pulse 91  Temp(Src) 98.1 F (36.7 C) (Oral)  Resp 18  Ht 5\' 5"  (1.651 m)  Wt 224 lb (101.606 kg)  BMI 37.28 kg/m2  SpO2 100%  LMP 09/10/2013 Physical Exam  Nursing note and  vitals reviewed. Constitutional: She is oriented to person, place, and time. She appears well-developed and well-nourished. No distress.  HENT:  Head: Normocephalic and atraumatic.  Mouth/Throat: Oropharynx is clear and moist.  Cardiovascular: Normal rate, regular rhythm, normal heart sounds and intact distal pulses.   No murmur heard. Pulmonary/Chest: Effort normal and breath sounds normal. No respiratory distress.  Abdominal: Soft. Normal appearance and bowel sounds are normal. She exhibits no distension and no mass. There is no  hepatosplenomegaly. There is tenderness in the suprapubic area. There is no rebound, no guarding and no CVA tenderness.  Genitourinary: Uterus normal. There is no rash, tenderness, lesion or injury on the right labia. There is no rash, tenderness, lesion or injury on the left labia. Cervix exhibits motion tenderness. Cervix exhibits no discharge and no friability. Right adnexum displays no mass, no tenderness and no fullness. Left adnexum displays no mass, no tenderness and no fullness. There is tenderness around the vagina. No erythema or bleeding around the vagina. No foreign body around the vagina. No signs of injury around the vagina. Vaginal discharge found.  Musculoskeletal: Normal range of motion. She exhibits no edema.  Neurological: She is alert and oriented to person, place, and time. She exhibits normal muscle tone. Coordination normal.  Skin: Skin is warm and dry.    ED Course  Procedures (including critical care time) Labs Review Labs Reviewed  URINALYSIS, ROUTINE W REFLEX MICROSCOPIC - Abnormal; Notable for the following:    Specific Gravity, Urine <1.005 (*)    All other components within normal limits  BASIC METABOLIC PANEL - Abnormal; Notable for the following:    Potassium 3.3 (*)    All other components within normal limits  GC/CHLAMYDIA PROBE AMP  WET PREP, GENITAL  PREGNANCY, URINE  RPR  CBC WITH DIFFERENTIAL  HIV ANTIBODY (ROUTINE TESTING)    Imaging Review No results found.   EKG Interpretation None      Cultures are pending.    MDM   Final diagnoses:  None    patient is well appearing, VSS.  Non-toxic.  Will treat with IM Rocephin and zithromax.  No concerning sx's for TOA or PID  0110  Labs still pending, end of shift, patient signed out to Dr. Wyvonnia Dusky    Joshva Labreck L. Lachele Lievanos, PA-C 10/02/13 0112

## 2013-11-03 ENCOUNTER — Encounter (HOSPITAL_COMMUNITY): Payer: Self-pay | Admitting: Emergency Medicine

## 2013-11-03 ENCOUNTER — Emergency Department (HOSPITAL_COMMUNITY)
Admission: EM | Admit: 2013-11-03 | Discharge: 2013-11-04 | Disposition: A | Discharge status: Home / Self Care | Payer: BC Managed Care – PPO | Attending: Emergency Medicine | Admitting: Emergency Medicine

## 2013-11-03 DIAGNOSIS — Z9089 Acquired absence of other organs: Secondary | ICD-10-CM | POA: Insufficient documentation

## 2013-11-03 DIAGNOSIS — Z791 Long term (current) use of non-steroidal anti-inflammatories (NSAID): Secondary | ICD-10-CM | POA: Insufficient documentation

## 2013-11-03 DIAGNOSIS — Z3202 Encounter for pregnancy test, result negative: Secondary | ICD-10-CM | POA: Insufficient documentation

## 2013-11-03 DIAGNOSIS — R300 Dysuria: Secondary | ICD-10-CM

## 2013-11-03 DIAGNOSIS — G43909 Migraine, unspecified, not intractable, without status migrainosus: Secondary | ICD-10-CM | POA: Insufficient documentation

## 2013-11-03 DIAGNOSIS — N898 Other specified noninflammatory disorders of vagina: Secondary | ICD-10-CM

## 2013-11-03 DIAGNOSIS — Z792 Long term (current) use of antibiotics: Secondary | ICD-10-CM | POA: Insufficient documentation

## 2013-11-03 DIAGNOSIS — N730 Acute parametritis and pelvic cellulitis: Secondary | ICD-10-CM | POA: Insufficient documentation

## 2013-11-03 DIAGNOSIS — Z79899 Other long term (current) drug therapy: Secondary | ICD-10-CM | POA: Insufficient documentation

## 2013-11-03 LAB — URINALYSIS, ROUTINE W REFLEX MICROSCOPIC
Bilirubin Urine: NEGATIVE
Glucose, UA: NEGATIVE mg/dL
Hgb urine dipstick: NEGATIVE
KETONES UR: NEGATIVE mg/dL
LEUKOCYTES UA: NEGATIVE
NITRITE: NEGATIVE
PROTEIN: NEGATIVE mg/dL
Specific Gravity, Urine: 1.015 (ref 1.005–1.030)
UROBILINOGEN UA: 0.2 mg/dL (ref 0.0–1.0)
pH: 6.5 (ref 5.0–8.0)

## 2013-11-03 LAB — PREGNANCY, URINE: Preg Test, Ur: NEGATIVE

## 2013-11-03 NOTE — ED Notes (Signed)
Urinary frequency and burning for 24 hours with back and abd pain

## 2013-11-04 LAB — WET PREP, GENITAL
Trich, Wet Prep: NONE SEEN
Yeast Wet Prep HPF POC: NONE SEEN

## 2013-11-04 LAB — HIV ANTIBODY (ROUTINE TESTING W REFLEX): HIV 1&2 Ab, 4th Generation: NONREACTIVE

## 2013-11-04 LAB — RPR

## 2013-11-04 MED ORDER — METOCLOPRAMIDE HCL 5 MG/ML IJ SOLN
10.0000 mg | Freq: Once | INTRAMUSCULAR | Status: AC
  Administered 2013-11-04: 10 mg via INTRAMUSCULAR
  Filled 2013-11-04: qty 2

## 2013-11-04 MED ORDER — DIPHENHYDRAMINE HCL 25 MG PO CAPS
50.0000 mg | ORAL_CAPSULE | Freq: Once | ORAL | Status: AC
  Administered 2013-11-04: 50 mg via ORAL
  Filled 2013-11-04: qty 2

## 2013-11-04 MED ORDER — CEFTRIAXONE SODIUM 250 MG IJ SOLR
250.0000 mg | Freq: Once | INTRAMUSCULAR | Status: AC
Start: 2013-11-04 — End: 2013-11-04
  Administered 2013-11-04: 250 mg via INTRAMUSCULAR
  Filled 2013-11-04: qty 250

## 2013-11-04 MED ORDER — AZITHROMYCIN 250 MG PO TABS
1000.0000 mg | ORAL_TABLET | Freq: Once | ORAL | Status: AC
  Administered 2013-11-04: 1000 mg via ORAL
  Filled 2013-11-04: qty 4

## 2013-11-04 MED ORDER — IBUPROFEN 800 MG PO TABS
800.0000 mg | ORAL_TABLET | Freq: Once | ORAL | Status: AC
  Administered 2013-11-04: 800 mg via ORAL
  Filled 2013-11-04: qty 1

## 2013-11-04 MED ORDER — LIDOCAINE HCL (PF) 1 % IJ SOLN
INTRAMUSCULAR | Status: AC
Start: 2013-11-04 — End: 2013-11-04
  Administered 2013-11-04: 2 mL
  Filled 2013-11-04: qty 5

## 2013-11-04 MED ORDER — DOXYCYCLINE HYCLATE 100 MG PO CAPS: 100.0000 mg | ORAL_CAPSULE | Freq: Two times a day (BID) | ORAL | Status: DC

## 2013-11-04 NOTE — ED Provider Notes (Signed)
CSN: [id number]     Arrival date & time 11/03/13  2113 History  This chart was scribed for Mariea Clonts, MD by Roxan Diesel, ED scribe.  This patient was seen in room [facility]17/[facility] and the patient's care was started at 12:53 AM.   Chief Complaint  Patient presents with  . Urinary Frequency    The history is provided by the patient. No language interpreter was used.    HPI Comments: Joyce [name] is a 38 y.o. female who presents to the Emergency Department complaining of 24 hours of "burning" "sharp" lower abdominal on urinating.  Pt states she has her pain only when urinating and also reports associated thick white vaginal discharge.  She also notes fevers and chills.  She denies h/o STD or pelvic infections.  She does admits to recent unprotected sexual encounter with a new partner.    Pt also complains of a headache that began 2 days ago and has been gradually worsening.  She admits to prior h/o migraines and states her current headache feels similar.  She reports some associated photophobia.  She denies associated vision loss, weakness or numbness in arms or legs, balance problems, or neck pain.   Past Medical History  Diagnosis Date  . Migraine     Past Surgical History  Procedure Laterality Date  . Cholecystectomy      No family history on file.   History  Substance Use Topics  . Smoking status: Never Smoker   . Smokeless tobacco: Not on file  . Alcohol Use: No    OB History   Grav Para Term Preterm Abortions TAB SAB Ect Mult Living   9 6 6  0 2  1          Review of Systems  All other systems reviewed and are negative.     Allergies  Review of patient's allergies indicates no known allergies.  Home Medications   Prior to Admission medications   Medication Sig Start Date End Date Taking? Authorizing Provider  dicyclomine (BENTYL) 20 MG tablet Take 1 tablet (20 mg total) by mouth 2 (two) times daily. 07/04/13   Orpah Greek, MD   diphenoxylate-atropine (LOMOTIL) 2.5-0.025 MG per tablet Take 2 tablets by mouth 4 (four) times daily as needed for diarrhea or loose stools. 07/04/13   Orpah Greek, MD  ferrous sulfate 325 (65 FE) MG tablet Take 1 tablet (325 mg total) by mouth daily. 07/03/13   Orlie Dakin, MD  Fiber, Guar Gum, CHEW Chew 2 tablets by mouth daily.    Historical Provider, MD  ibuprofen (ADVIL,MOTRIN) 600 MG tablet Take 1 tablet (600 mg total) by mouth every 6 (six) hours as needed for pain. 06/09/12   John-Adam Bonk, MD  metroNIDAZOLE (FLAGYL) 500 MG tablet Take 1 tablet (500 mg total) by mouth 2 (two) times daily. 10/02/13   Ezequiel Essex, MD  ondansetron (ZOFRAN) 4 MG tablet Take 1 tablet (4 mg total) by mouth every 6 (six) hours. 07/04/13   Orpah Greek, MD   BP 135/86  Pulse 81  Temp(Src) 98 F (36.7 C) (Oral)  Resp 18  Ht 5\' 5"  (1.651 m)  Wt 217 lb (98.431 kg)  BMI 36.11 kg/m2  SpO2 100%  LMP 10/09/2013  Physical Exam  Nursing note and vitals reviewed. Constitutional: She is oriented to person, place, and time. She appears well-developed and well-nourished. No distress.  HENT:  Head: Normocephalic and atraumatic.  Eyes: Conjunctivae and EOM are normal.  Neck:  Normal range of motion. Neck supple. No tracheal deviation present.  No meningismus  Cardiovascular: Normal rate.   Pulmonary/Chest: Effort normal. No respiratory distress.  Abdominal: Soft. There is tenderness (Discomfort to suprapubic abdomen and lower pelvis.  ). There is no guarding.  Genitourinary:  White dc, mild cmt and adnexal left  Musculoskeletal: Normal range of motion.  Neurological: She is alert and oriented to person, place, and time. No cranial nerve deficit. Coordination normal.  Visual fields intact, no facial droop, no arm drift, finger-nose-finger intact bilaterally Equal strength in bilateral extremiteis.  Sensation to palpation equal bilaterally.  Skin: Skin is warm and dry.  Psychiatric: She has a  normal mood and affect. Her behavior is normal.    ED Course  Procedures (including critical care time)  DIAGNOSTIC STUDIES: Oxygen Saturation is 100% on room air, normal by my interpretation.    COORDINATION OF CARE: 12:58 AM-Discussed treatment plan which includes pain medication, pelvic exam, antibiotics, and f/u with PCP with pt at bedside and pt agreed to plan.     Labs Review Labs Reviewed  WET PREP, GENITAL - Abnormal; Notable for the following:    Clue Cells Wet Prep HPF POC FEW (*)    WBC, Wet Prep HPF POC FEW (*)    All other components within normal limits  GC/CHLAMYDIA PROBE AMP  URINALYSIS, ROUTINE W REFLEX MICROSCOPIC  PREGNANCY, URINE  RPR  HIV ANTIBODY (ROUTINE TESTING)    Imaging Review No results found.   EKG Interpretation None      MDM   Final diagnoses:  PID (acute pelvic inflammatory disease)  Dysuria  Vaginal discharge  Migraine   I personally performed the services described in this documentation, which was scribed in my presence. The recorded information has been reviewed and is accurate.  Patient presents with headache similar to previous, gradual onset, no signs of meningitis normal neuro exam in ER.  Vaginal discharge and suprapubic pain consistent with STD/PID. Antibiotics given in the ER, antibiotics for home and close followup with OB/GYN discussed.  Patient's symptoms improved in ER.  Results and differential diagnosis were discussed with the patient/parent/guardian. Close follow up outpatient was discussed, comfortable with the plan.   Medications  metoCLOPramide (REGLAN) injection 10 mg (10 mg Intramuscular Given 11/04/13 0142)  diphenhydrAMINE (BENADRYL) capsule 50 mg (50 mg Oral Given 11/04/13 0139)  cefTRIAXone (ROCEPHIN) injection 250 mg (250 mg Intramuscular Given 11/04/13 0141)  azithromycin (ZITHROMAX) tablet 1,000 mg (1,000 mg Oral Given 11/04/13 0139)  ibuprofen (ADVIL,MOTRIN) tablet 800 mg (800 mg Oral Given 11/04/13 0140)   lidocaine (PF) (XYLOCAINE) 1 % injection (2 mLs  Given 11/04/13 0144)    Filed Vitals:   11/03/13 2211 11/04/13 0238  BP: 135/86 112/61  Pulse: 81 58  Temp: 98 F (36.7 C)   TempSrc: Oral   Resp: 18 18  Height: 5\' 5"  (1.651 m)   Weight: 217 lb (98.431 kg)   SpO2: 100% 100%      Mariea Clonts, MD 11/04/13 [phone]

## 2013-11-04 NOTE — Discharge Instructions (Signed)
Make sure take antibiotics as directed and remember to stay out of the sun and avoid sexual intercourse until completed as this antibiotic is dangerous to developing fetuses. Follow up with local OB/GYN.  If you were given medicines take as directed.  If you are on coumadin or contraceptives realize their levels and effectiveness is altered by many different medicines.  If you have any reaction (rash, tongues swelling, other) to the medicines stop taking and see a physician.   Please follow up as directed and return to the ER or see a physician for new or worsening symptoms.  Thank you. Filed Vitals:   11/03/13 2211 11/04/13 0238  BP: 135/86 112/61  Pulse: 81 58  Temp: 98 F (36.7 C)   TempSrc: Oral   Resp: 18 18  Height: 5\' 5"  (1.651 m)   Weight: 217 lb (98.431 kg)   SpO2: 100% 100%

## 2013-11-05 LAB — GC/CHLAMYDIA PROBE AMP
CT Probe RNA: NEGATIVE
GC PROBE AMP APTIMA: NEGATIVE

## 2014-01-22 IMAGING — US US OB COMP LESS 14 WK
1 series · 14 of 28 positions shown · non-contrast
Comparison: [DATE]

CLINICAL DATA: Pregnant, follow-up subchronic hemorrhage

EXAM:
OBSTETRIC <14 WK ULTRASOUND
TECHNIQUE: Transabdominal ultrasound was performed for evaluation of the
gestation as well as the maternal uterus and adnexal regions.

[Series 1: us ob comp less 14 wk · 0.22mm/px · 44 acquisitions, 14 frames shown]
[im 2/44]
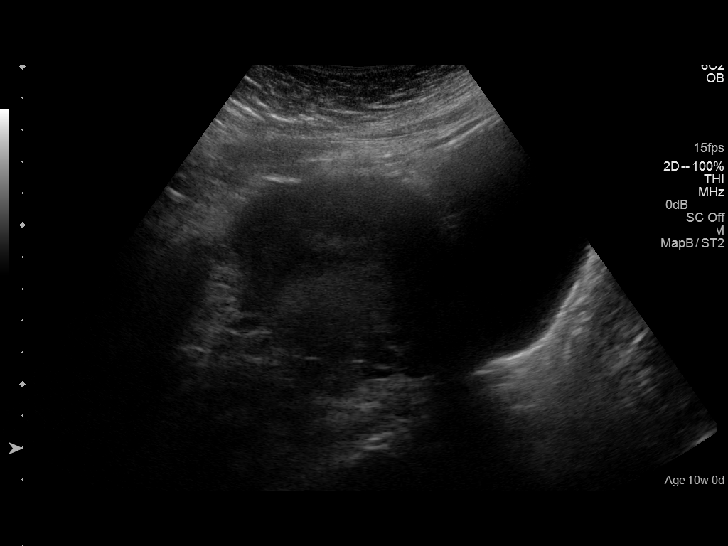
[im 5/44]
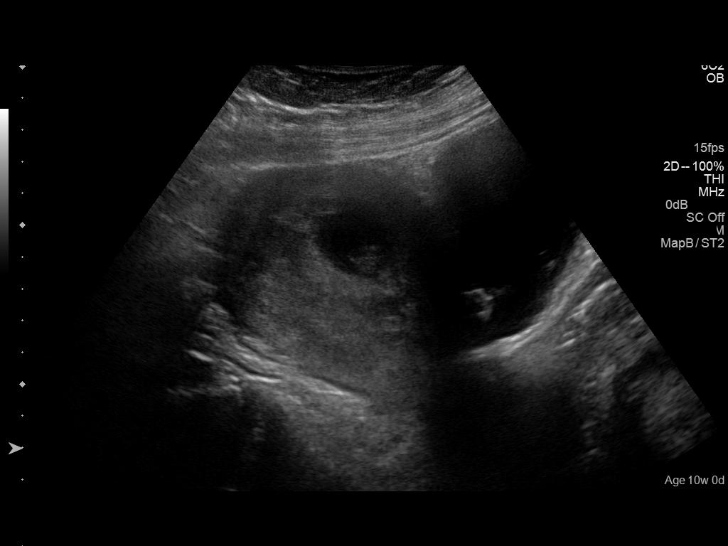
[im 8/44]
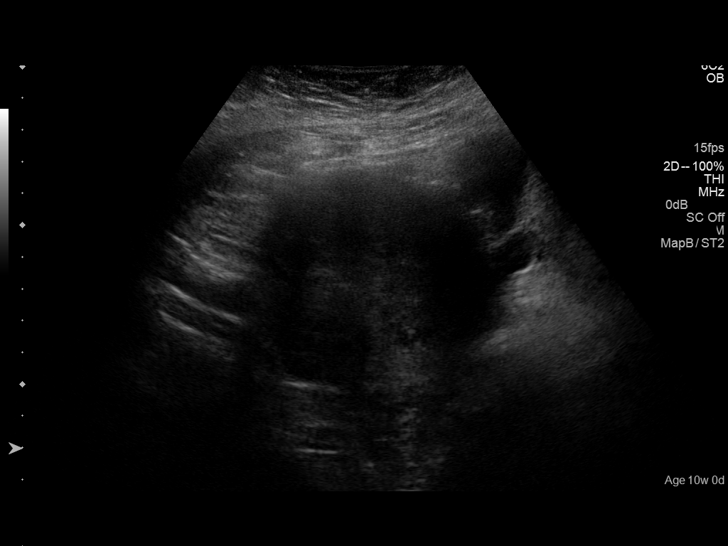
[im 12/44]
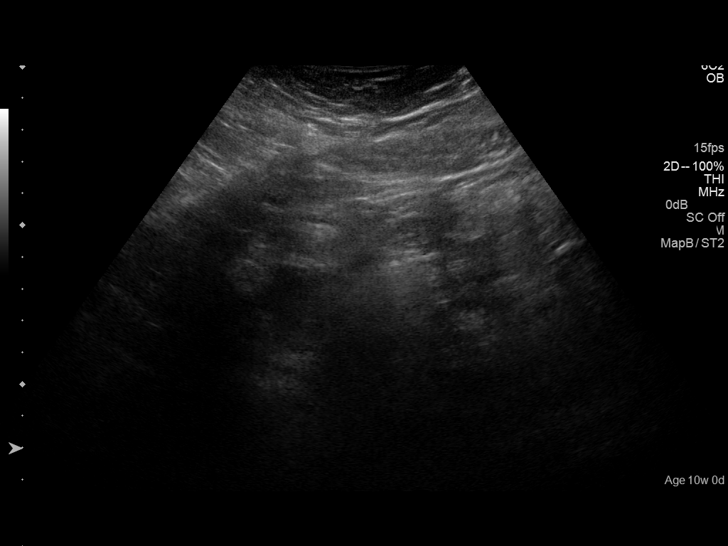
[im 15/44]
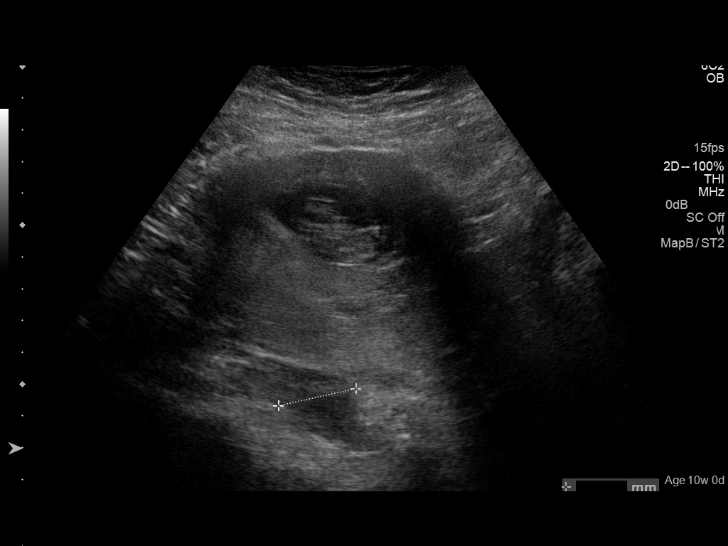
[im 18/44]
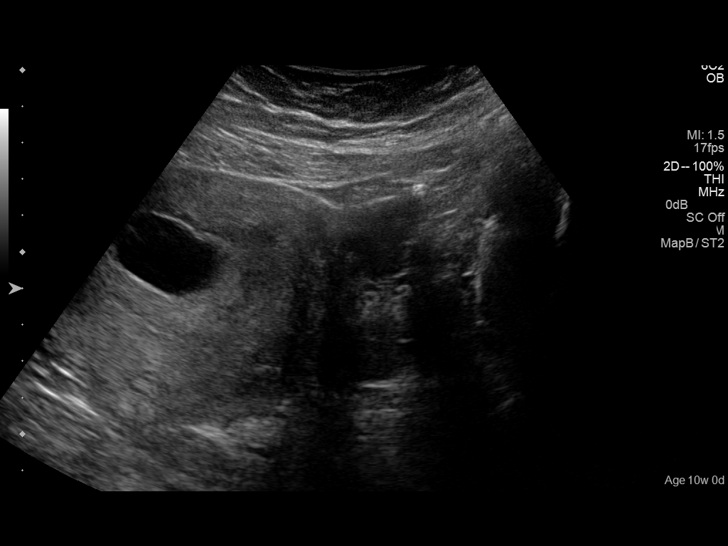
[im 21/44]
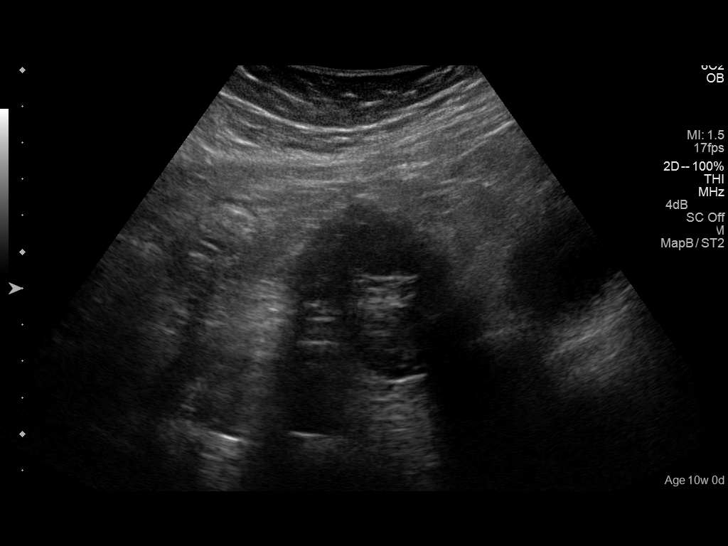
[im 24/44]
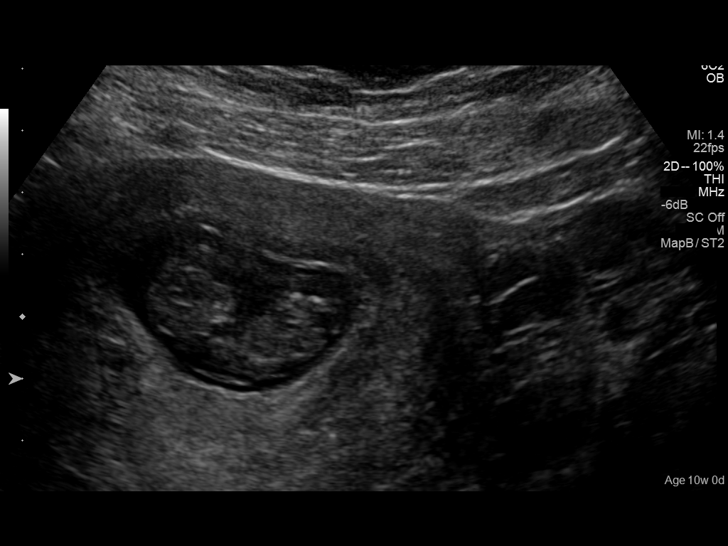
[im 28/44]
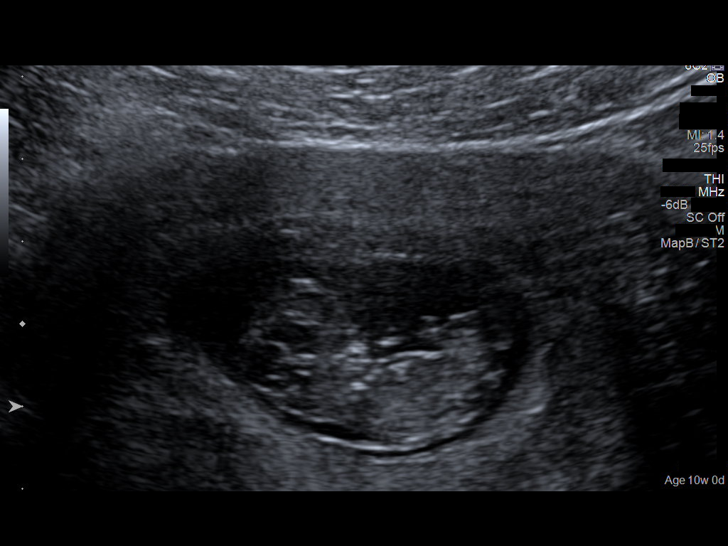
[im 31/44]
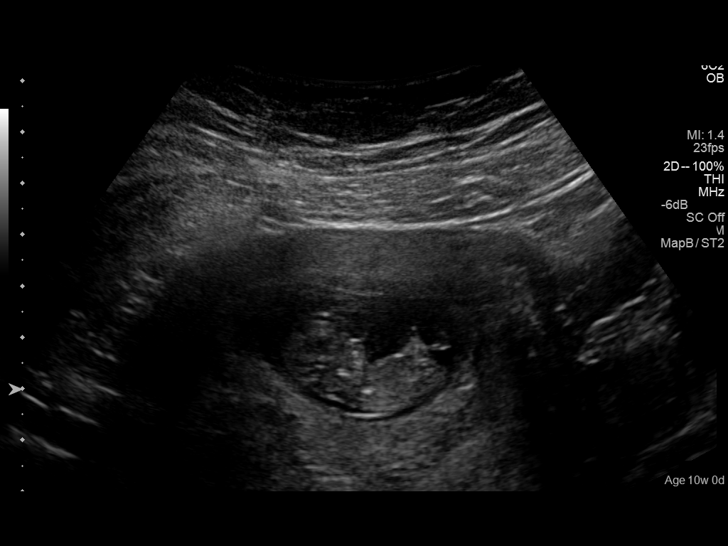
[im 34/44]
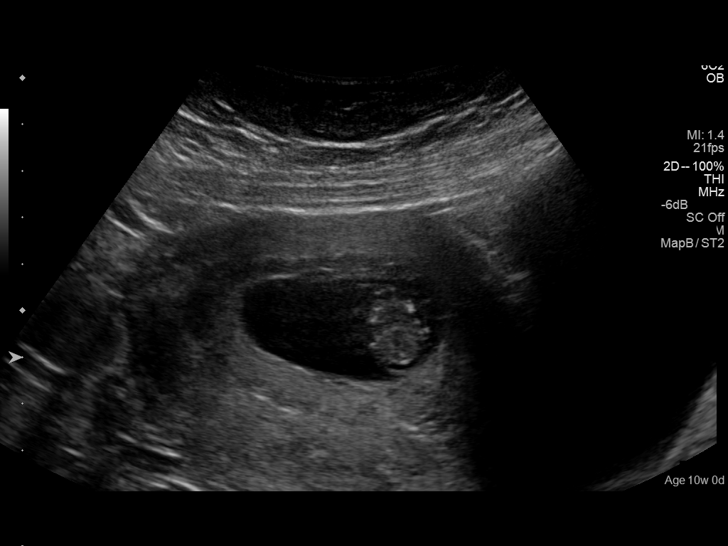
[im 37/44]
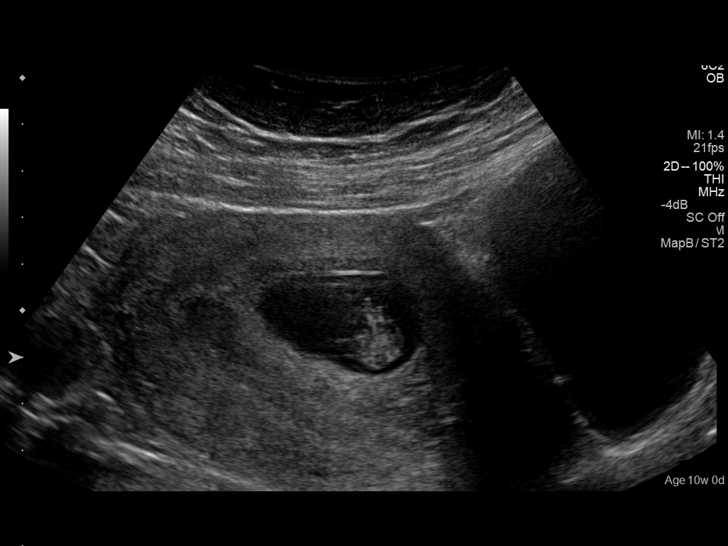
[im 40/44]
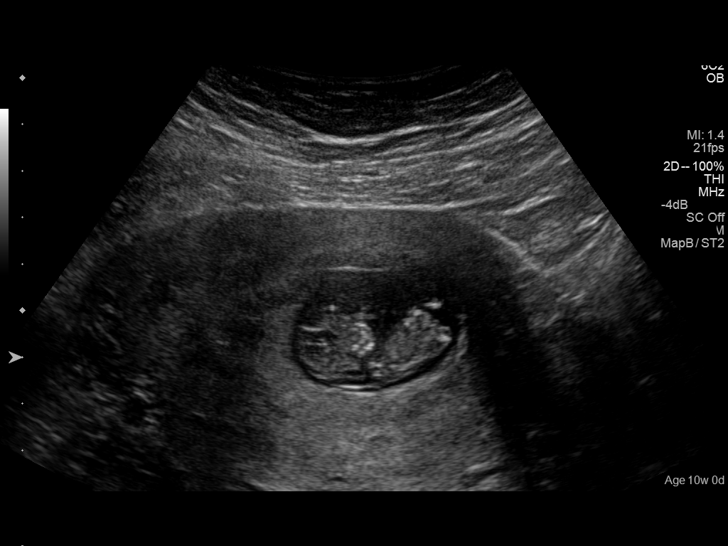
[im 44/44]
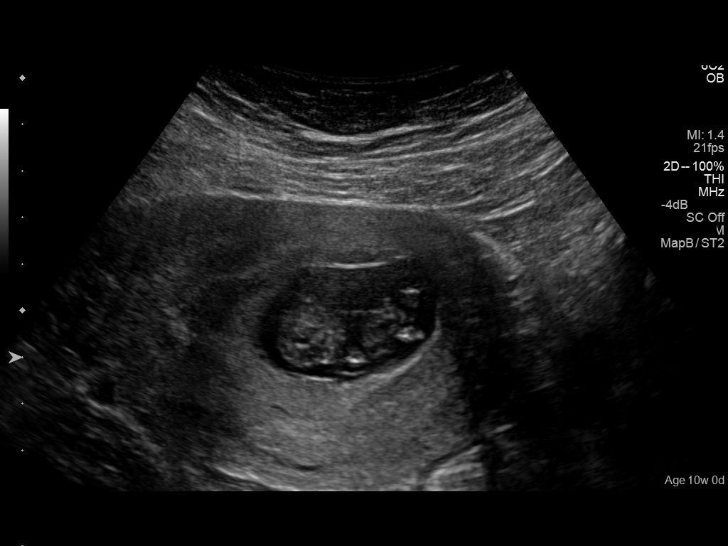

[14 of 28 positions shown; findings below may reference images not displayed]

FINDINGS: Intrauterine gestational sac: Visualized/normal in shape.

Yolk sac:  Present

Embryo:  Present

Cardiac Activity: Present

Heart Rate: 165 bpm

CRL:   31.8  mm   10 w 0 d                  US EDC: [DATE]

Maternal uterus/adnexae: Small residual subchronic hemorrhage.

Bilateral ovaries are within normal limits.

No free fluid.

Patient declined transvaginal ultrasound.
IMPRESSION: Single live intrauterine gestation with estimated gestational age 10
weeks 0 days by crown-rump length.

Small residual subchronic hemorrhage, improved.

## 2014-03-31 ENCOUNTER — Encounter (HOSPITAL_COMMUNITY): Payer: Self-pay | Admitting: *Deleted

## 2014-03-31 ENCOUNTER — Emergency Department (HOSPITAL_COMMUNITY)
Admission: EM | Admit: 2014-03-31 | Discharge: 2014-03-31 | Disposition: A | Discharge status: Home / Self Care | Payer: BC Managed Care – PPO | Attending: Emergency Medicine | Admitting: Emergency Medicine

## 2014-03-31 DIAGNOSIS — Z9089 Acquired absence of other organs: Secondary | ICD-10-CM | POA: Insufficient documentation

## 2014-03-31 DIAGNOSIS — Z3202 Encounter for pregnancy test, result negative: Secondary | ICD-10-CM | POA: Insufficient documentation

## 2014-03-31 DIAGNOSIS — Z792 Long term (current) use of antibiotics: Secondary | ICD-10-CM | POA: Diagnosis not present

## 2014-03-31 DIAGNOSIS — Z79899 Other long term (current) drug therapy: Secondary | ICD-10-CM | POA: Insufficient documentation

## 2014-03-31 DIAGNOSIS — M549 Dorsalgia, unspecified: Secondary | ICD-10-CM | POA: Insufficient documentation

## 2014-03-31 DIAGNOSIS — Z8679 Personal history of other diseases of the circulatory system: Secondary | ICD-10-CM | POA: Diagnosis not present

## 2014-03-31 DIAGNOSIS — R300 Dysuria: Secondary | ICD-10-CM | POA: Diagnosis not present

## 2014-03-31 DIAGNOSIS — A64 Unspecified sexually transmitted disease: Secondary | ICD-10-CM | POA: Insufficient documentation

## 2014-03-31 DIAGNOSIS — R109 Unspecified abdominal pain: Secondary | ICD-10-CM | POA: Diagnosis present

## 2014-03-31 LAB — CBC WITH DIFFERENTIAL/PLATELET
BASOS ABS: 0 10*3/uL (ref 0.0–0.1)
Basophils Relative: 0 % (ref 0–1)
EOS ABS: 0.1 10*3/uL (ref 0.0–0.7)
Eosinophils Relative: 1 % (ref 0–5)
HEMATOCRIT: 31.6 % — AB (ref 36.0–46.0)
Hemoglobin: 9.5 g/dL — ABNORMAL LOW (ref 12.0–15.0)
LYMPHS ABS: 2.9 10*3/uL (ref 0.7–4.0)
LYMPHS PCT: 24 % (ref 12–46)
MCH: 18.6 pg — ABNORMAL LOW (ref 26.0–34.0)
MCHC: 30.1 g/dL (ref 30.0–36.0)
MCV: 61.8 fL — ABNORMAL LOW (ref 78.0–100.0)
Monocytes Absolute: 0.6 10*3/uL (ref 0.1–1.0)
Monocytes Relative: 5 % (ref 3–12)
NEUTROS ABS: 8.3 10*3/uL — AB (ref 1.7–7.7)
Neutrophils Relative %: 70 % (ref 43–77)
Platelets: 409 10*3/uL — ABNORMAL HIGH (ref 150–400)
RBC: 5.11 MIL/uL (ref 3.87–5.11)
RDW: 19.2 % — ABNORMAL HIGH (ref 11.5–15.5)
Smear Review: INCREASED
WBC: 11.9 10*3/uL — ABNORMAL HIGH (ref 4.0–10.5)

## 2014-03-31 LAB — URINALYSIS, ROUTINE W REFLEX MICROSCOPIC
Bilirubin Urine: NEGATIVE
Glucose, UA: NEGATIVE mg/dL
HGB URINE DIPSTICK: NEGATIVE
Ketones, ur: NEGATIVE mg/dL
Leukocytes, UA: NEGATIVE
Nitrite: NEGATIVE
Protein, ur: NEGATIVE mg/dL
Specific Gravity, Urine: 1.015 (ref 1.005–1.030)
UROBILINOGEN UA: 0.2 mg/dL (ref 0.0–1.0)
pH: 7 (ref 5.0–8.0)

## 2014-03-31 LAB — BASIC METABOLIC PANEL
Anion gap: 12 (ref 5–15)
BUN: 6 mg/dL (ref 6–23)
CALCIUM: 9.4 mg/dL (ref 8.4–10.5)
CO2: 26 mEq/L (ref 19–32)
Chloride: 103 mEq/L (ref 96–112)
Creatinine, Ser: 0.63 mg/dL (ref 0.50–1.10)
GFR calc Af Amer: >90 mL/min (ref >90)
GLUCOSE: 94 mg/dL (ref 70–99)
Potassium: 3.8 mEq/L (ref 3.7–5.3)
SODIUM: 141 meq/L (ref 137–147)

## 2014-03-31 LAB — PREGNANCY, URINE: Preg Test, Ur: NEGATIVE

## 2014-03-31 MED ORDER — DOXYCYCLINE HYCLATE 100 MG PO CAPS: 100.0000 mg | ORAL_CAPSULE | Freq: Two times a day (BID) | ORAL | Status: DC

## 2014-03-31 MED ORDER — AZITHROMYCIN 250 MG PO TABS
1000.0000 mg | ORAL_TABLET | Freq: Once | ORAL | Status: AC
  Administered 2014-03-31: 1000 mg via ORAL
  Filled 2014-03-31: qty 4

## 2014-03-31 MED ORDER — HYDROCODONE-ACETAMINOPHEN 5-325 MG PO TABS: 1.0000 | ORAL_TABLET | Freq: Four times a day (QID) | ORAL | Status: DC | PRN

## 2014-03-31 MED ORDER — HYDROCODONE-ACETAMINOPHEN 5-325 MG PO TABS
1.0000 | ORAL_TABLET | Freq: Once | ORAL | Status: AC
  Administered 2014-03-31: 1 via ORAL
  Filled 2014-03-31: qty 1

## 2014-03-31 MED ORDER — STERILE WATER FOR INJECTION IJ SOLN
INTRAMUSCULAR | Status: AC
  Filled 2014-03-31: qty 10

## 2014-03-31 MED ORDER — CEFTRIAXONE SODIUM 250 MG IJ SOLR
250.0000 mg | Freq: Once | INTRAMUSCULAR | Status: AC
  Administered 2014-03-31: 250 mg via INTRAMUSCULAR
  Filled 2014-03-31: qty 250

## 2014-03-31 NOTE — ED Notes (Signed)
Low abd pain , headache, dysuria,  Fever, chills.   Nausea, no vomiting

## 2014-03-31 NOTE — ED Notes (Signed)
Lab in to draw blood.

## 2014-03-31 NOTE — ED Provider Notes (Signed)
CSN: [id number]     Arrival date & time 03/31/14  1650 History  This chart was scribed for Maudry Diego, MD by Dellis Filbert, ED Scribe. The patient was seen in [facility] and the patient's care was started at 8:16 PM.    Chief Complaint  Patient presents with  . Abdominal Pain   Patient is a 38 y.o. female presenting with abdominal pain. The history is provided by the patient. No language interpreter was used.  Abdominal Pain Pain location:  Suprapubic and L flank Pain severity:  Mild Onset quality:  Sudden Timing:  Constant Chronicity:  New Associated symptoms: chills and dysuria   Associated symptoms: no chest pain, no cough, no diarrhea, no fatigue and no hematuria     HPI Comments: ALONNA BARTLING is a 38 y.o. female who presents to the Emergency Department complaining of abdominal and lower back pain. She has dysuria, chills, and a HA as associated symptoms. Pt states she took tylenol for her HA which provided minor relief. Pt denies fever.   Past Medical History  Diagnosis Date  . Migraine    Past Surgical History  Procedure Laterality Date  . Cholecystectomy     History reviewed. No pertinent family history. History  Substance Use Topics  . Smoking status: Never Smoker   . Smokeless tobacco: Not on file  . Alcohol Use: No   OB History    Gravida Para Term Preterm AB TAB SAB Ectopic Multiple Living   9 6 6  0 2  1        Review of Systems  Constitutional: Positive for chills. Negative for appetite change and fatigue.  HENT: Negative for congestion, ear discharge and sinus pressure.   Eyes: Negative for discharge.  Respiratory: Negative for cough.   Cardiovascular: Negative for chest pain.  Gastrointestinal: Positive for abdominal pain. Negative for diarrhea.  Genitourinary: Positive for dysuria. Negative for frequency and hematuria.  Musculoskeletal: Positive for back pain.  Skin: Negative for rash.  Neurological: Negative for seizures and headaches.   Psychiatric/Behavioral: Negative for hallucinations.   Allergies  Review of patient's allergies indicates no known allergies.  Home Medications   Prior to Admission medications   Medication Sig Start Date End Date Taking? Authorizing Provider  ibuprofen (ADVIL,MOTRIN) 200 MG tablet Take 200 mg by mouth every 6 (six) hours as needed for mild pain or moderate pain.   Yes Historical Provider, MD  dicyclomine (BENTYL) 20 MG tablet Take 1 tablet (20 mg total) by mouth 2 (two) times daily. Patient not taking: Reported on 03/31/2014 07/04/13   Orpah Greek, MD  diphenoxylate-atropine (LOMOTIL) 2.5-0.025 MG per tablet Take 2 tablets by mouth 4 (four) times daily as needed for diarrhea or loose stools. Patient not taking: Reported on 03/31/2014 07/04/13   Orpah Greek, MD  doxycycline (VIBRAMYCIN) 100 MG capsule Take 1 capsule (100 mg total) by mouth 2 (two) times daily. Patient not taking: Reported on 03/31/2014 11/04/13   Mariea Clonts, MD  ferrous sulfate 325 (65 FE) MG tablet Take 1 tablet (325 mg total) by mouth daily. Patient not taking: Reported on 03/31/2014 07/03/13   Orlie Dakin, MD  Fiber, Guar Gum, CHEW Chew 2 tablets by mouth daily.    Historical Provider, MD  ibuprofen (ADVIL,MOTRIN) 600 MG tablet Take 1 tablet (600 mg total) by mouth every 6 (six) hours as needed for pain. Patient not taking: Reported on 03/31/2014 06/09/12   Rhunette Croft, MD  metroNIDAZOLE (FLAGYL) 500 MG tablet  Take 1 tablet (500 mg total) by mouth 2 (two) times daily. Patient not taking: Reported on 03/31/2014 10/02/13   Ezequiel Essex, MD  ondansetron (ZOFRAN) 4 MG tablet Take 1 tablet (4 mg total) by mouth every 6 (six) hours. Patient not taking: Reported on 03/31/2014 07/04/13   Orpah Greek, MD   BP 111/47 mmHg  Pulse 81  Temp(Src) 98.8 F (37.1 C) (Oral)  Resp 18  Ht 5\' 5"  (1.651 m)  Wt 204 lb (92.534 kg)  BMI 33.95 kg/m2  SpO2 100%  LMP 03/17/2014 Physical Exam  Constitutional:  She is oriented to person, place, and time. She appears well-developed.  HENT:  Head: Normocephalic.  Eyes: Conjunctivae and EOM are normal. No scleral icterus.  Neck: Neck supple. No thyromegaly present.  Cardiovascular: Normal rate and regular rhythm.  Exam reveals no gallop and no friction rub.   No murmur heard. Pulmonary/Chest: No stridor. She has no wheezes. She has no rales. She exhibits no tenderness.  Abdominal: She exhibits no distension. There is no tenderness. There is no rebound.  Mild suprapubic and left flank tenderness.  Musculoskeletal: Normal range of motion. She exhibits no edema.  Lymphadenopathy:    She has no cervical adenopathy.  Neurological: She is oriented to person, place, and time. She exhibits normal muscle tone. Coordination normal.  Skin: No rash noted. No erythema.  Psychiatric: She has a normal mood and affect. Her behavior is normal.    ED Course  Procedures  DIAGNOSTIC STUDIES: Oxygen Saturation is 100% on room air, normal by my interpretation.    COORDINATION OF CARE: 8:19 PM Discussed treatment plan with pt at bedside and pt agreed to plan.   Labs Review Labs Reviewed  URINALYSIS, ROUTINE W REFLEX MICROSCOPIC  PREGNANCY, URINE  CBC WITH DIFFERENTIAL  BASIC METABOLIC PANEL    Imaging Review No results found.   EKG Interpretation None      MDM   Final diagnoses:  None     Std  The chart was scribed for me under my direct supervision.  I personally performed the history, physical, and medical decision making and all procedures in the evaluation of this patient.Maudry Diego, MD 03/31/14 2251

## 2014-04-01 LAB — HIV ANTIBODY (ROUTINE TESTING W REFLEX): HIV 1&2 Ab, 4th Generation: NONREACTIVE

## 2014-04-02 LAB — GC/CHLAMYDIA PROBE AMP
CT Probe RNA: NEGATIVE
GC Probe RNA: NEGATIVE

## 2014-04-10 ENCOUNTER — Encounter (HOSPITAL_COMMUNITY): Payer: Self-pay | Admitting: *Deleted

## 2014-05-30 NOTE — L&D Delivery Note (Signed)
Delivery Note At 10:06 AM a viable female was delivered via Vaginal, Spontaneous Delivery (Presentation: Right Occiput Posterior).  APGAR: 8,9.   Placenta status: delivered intact;  Cord: 3 vessels with the following complications: None.  Anesthesia: Epidural  Episiotomy: None Lacerations: None Suture Repair: n/a Est. Blood Loss (mL): 150  Mom to postpartum.  Baby to Couplet care / Skin to Skin.  Zaraya Delauder H. 05/26/2015, 10:25 AM

## 2014-07-04 ENCOUNTER — Encounter: Payer: Self-pay | Admitting: Adult Health

## 2014-07-04 ENCOUNTER — Other Ambulatory Visit: Payer: Self-pay | Admitting: Adult Health

## 2014-10-14 IMAGING — US US PELVIS COMPLETE
1 series · 13 of 25 positions shown · non-contrast
Comparison: None

CLINICAL DATA: Pelvic pain for 2 days, 2 months postpartum

EXAM:
TRANSABDOMINAL AND TRANSVAGINAL ULTRASOUND OF PELVIS
TECHNIQUE: Both transabdominal and transvaginal ultrasound examinations of the
pelvis were performed. Transabdominal technique was performed for
global imaging of the pelvis including uterus, ovaries, adnexal
regions, and pelvic cul-de-sac. It was necessary to proceed with
endovaginal exam following the transabdominal exam to visualize the
heterogeneous endometrium.

[Series 1: us pelvis complete · 0.24mm/px · 13 of 91 slices shown]
[im 1/91]
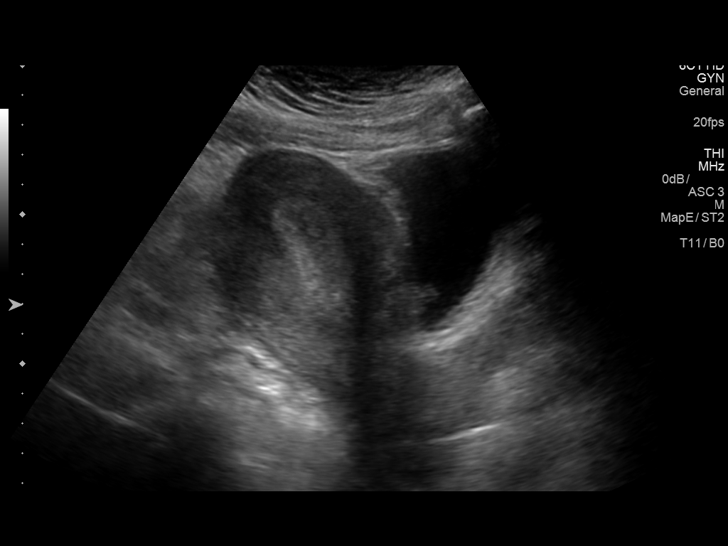
[im 8/91]
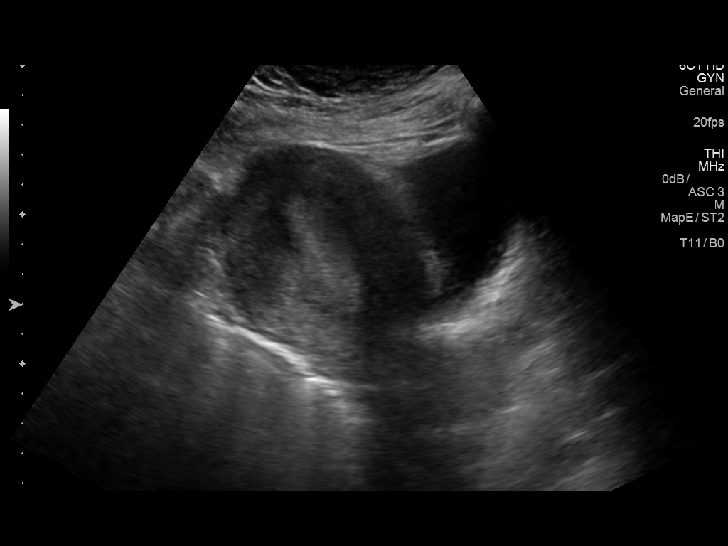
[im 16/91]
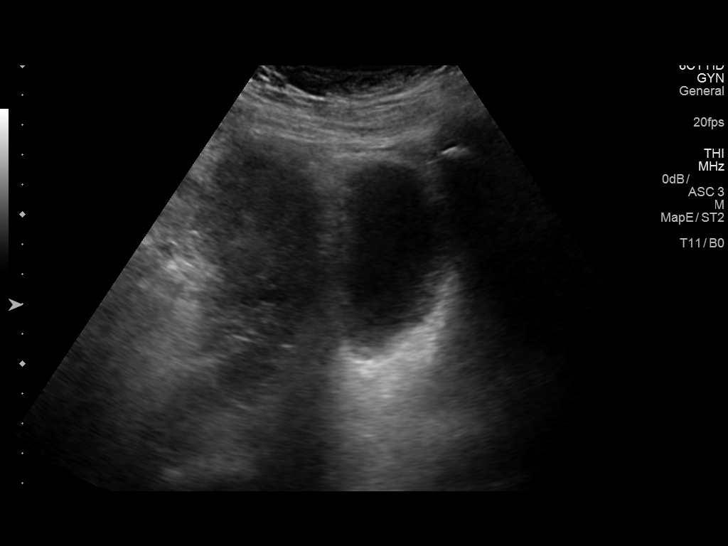
[im 23/91]
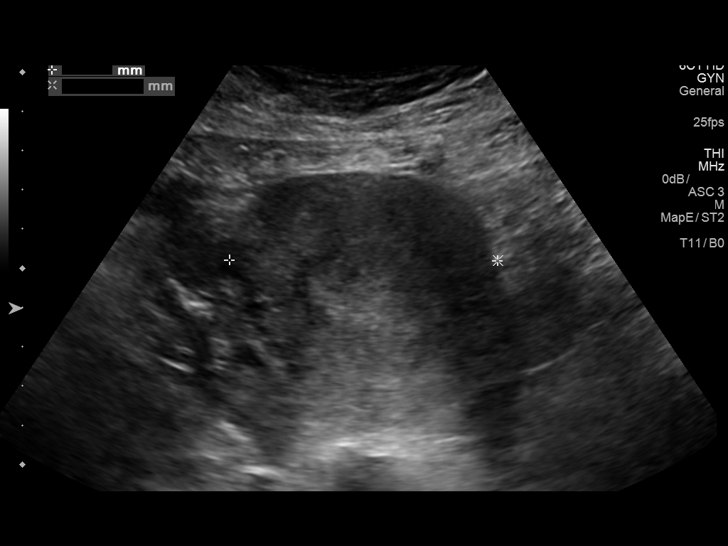
[im 31/91]
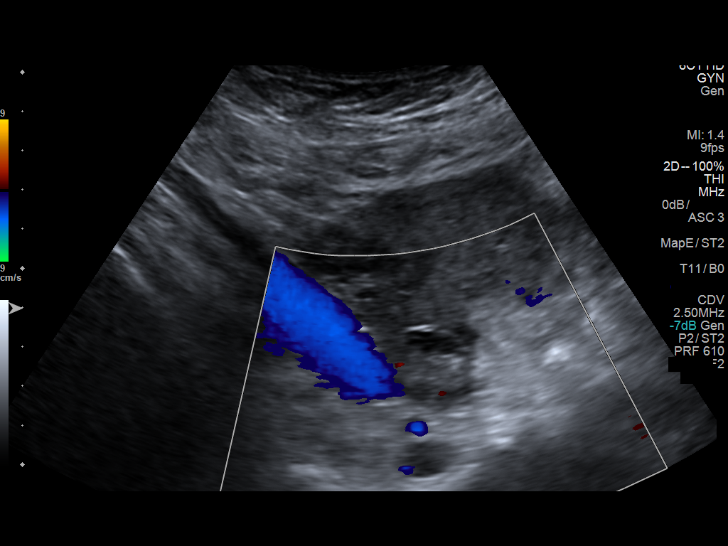
[im 38/91]
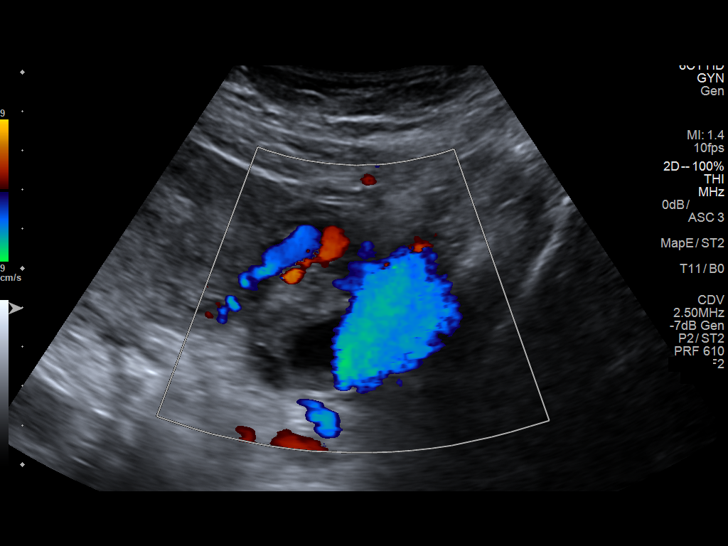
[im 46/91]
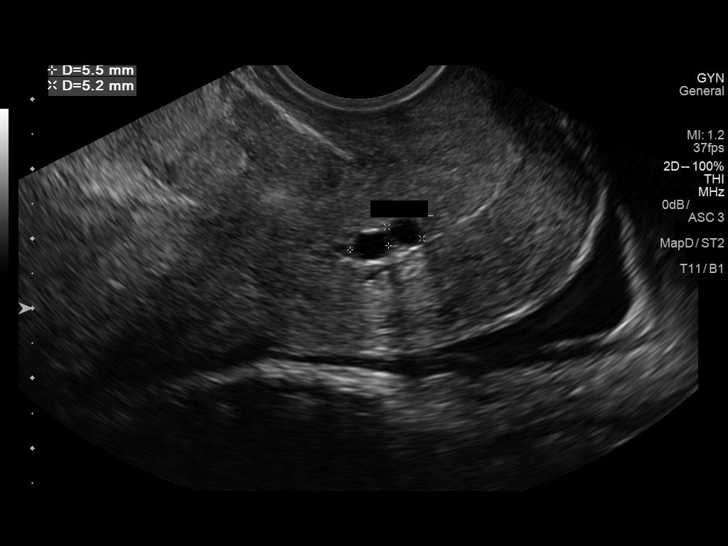
[im 53/91]
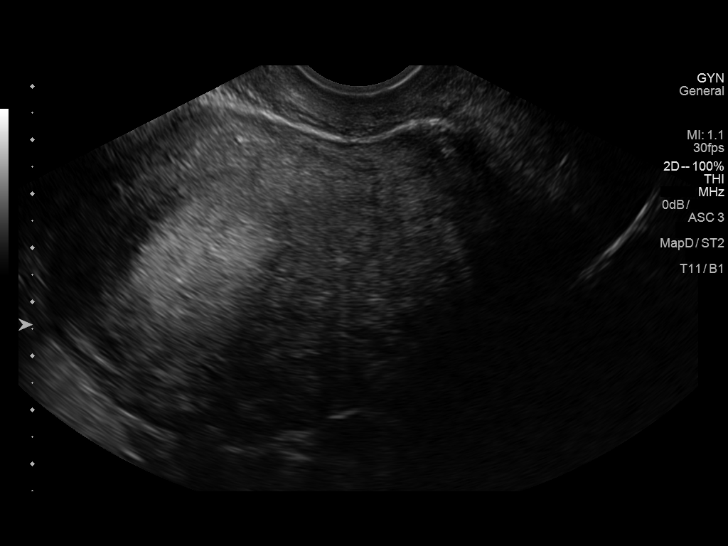
[im 61/91]
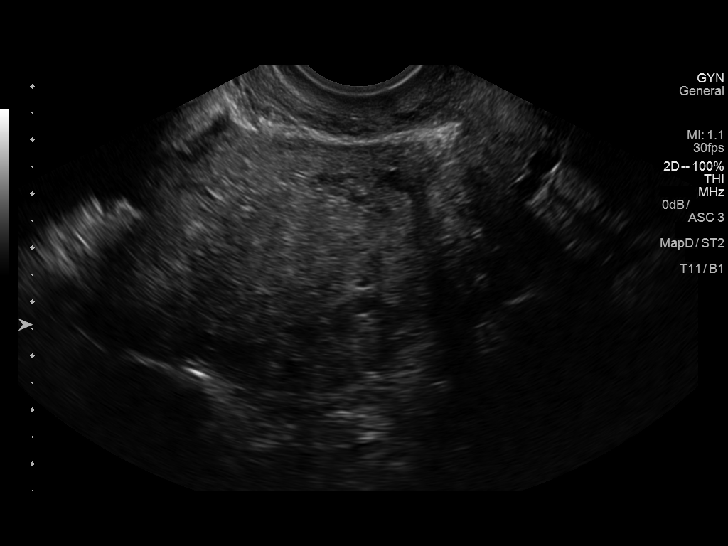
[im 68/91]
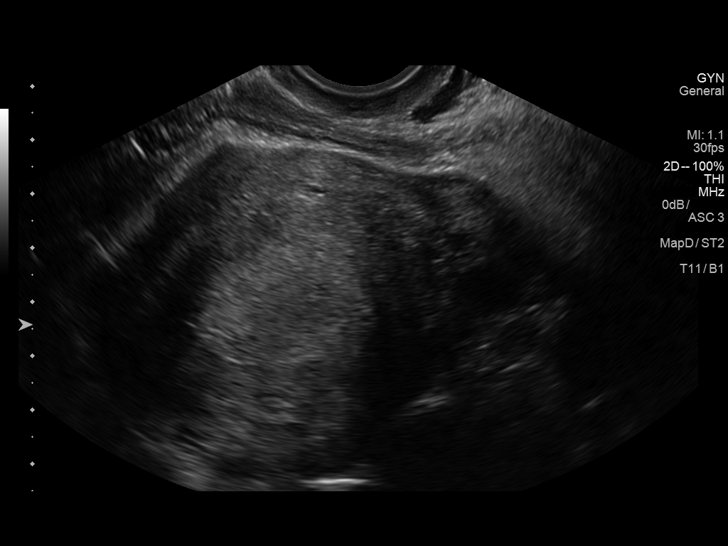
[im 76/91]
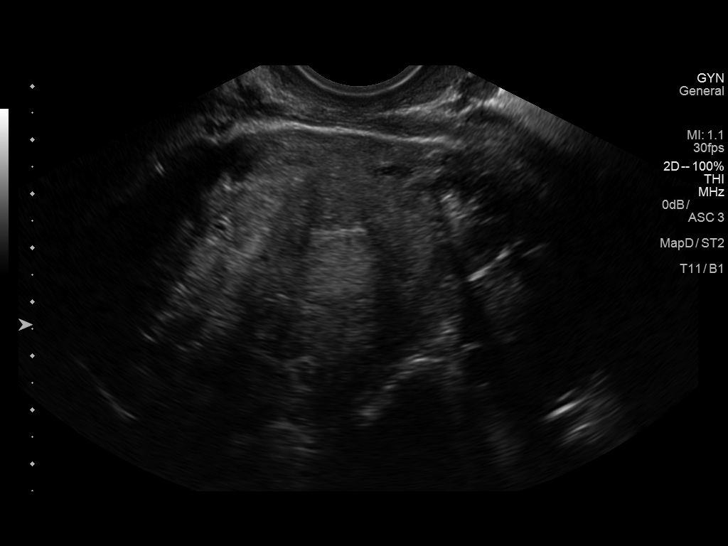
[im 83/91]
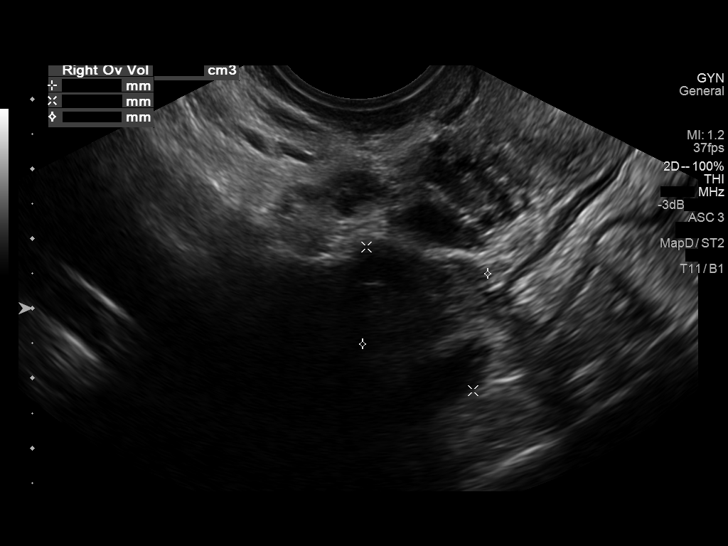
[im 91/91]
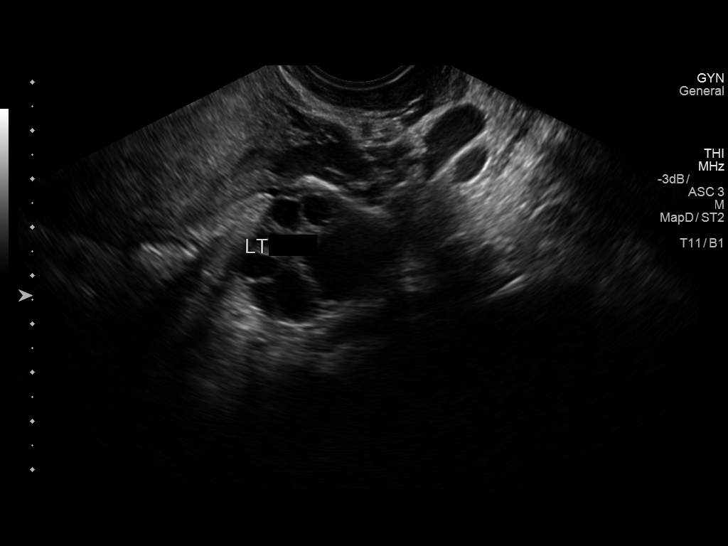

[13 of 25 positions shown; findings below may reference images not displayed]

FINDINGS: Uterus

Measurements: 12.6 x 6.2 x 6.8 cm. Normal morphology without mass.

Endometrium

Thickness: 20 mm thick, prominent. Heterogeneous myometrial
appearance without endometrial fluid. No definite retained products
of conception identified. Nabothian cyst noted at cervix.

Right ovary

Measurements: 2.6 x 2.1 x 2.2 cm. Normal morphology without mass.
Internal blood flow present on color Doppler imaging.

Left ovary

Measurements: 2.7 x 3.1 x 2.8 cm. Multiple follicle cysts without
dominant mass. Internal blood flow present on color Doppler imaging.

Other findings

No adnexal masses.

Trace free pelvic fluid.
IMPRESSION: Thickened and mildly heterogeneous endometrial complex 20 mm thick.

If bleeding remains unresponsive to hormonal or medical therapy,
focal lesion work-up with sonohysterogram should be considered.
Endometrial biopsy should also be considered in pre-menopausal
patients at high risk for endometrial carcinoma. (Ref: Radiological
Reasoning: Algorithmic Workup of Abnormal Vaginal Bleeding with
Endovaginal Sonography and Sonohysterography. AJR [CV]; 191:[id number])

## 2014-10-16 ENCOUNTER — Encounter (HOSPITAL_COMMUNITY): Payer: Self-pay

## 2014-10-16 DIAGNOSIS — A599 Trichomoniasis, unspecified: Secondary | ICD-10-CM | POA: Diagnosis not present

## 2014-10-16 DIAGNOSIS — O2341 Unspecified infection of urinary tract in pregnancy, first trimester: Secondary | ICD-10-CM | POA: Insufficient documentation

## 2014-10-16 DIAGNOSIS — Z3A01 Less than 8 weeks gestation of pregnancy: Secondary | ICD-10-CM | POA: Insufficient documentation

## 2014-10-16 DIAGNOSIS — O99211 Obesity complicating pregnancy, first trimester: Secondary | ICD-10-CM | POA: Diagnosis not present

## 2014-10-16 DIAGNOSIS — O23591 Infection of other part of genital tract in pregnancy, first trimester: Secondary | ICD-10-CM | POA: Diagnosis not present

## 2014-10-16 DIAGNOSIS — O9989 Other specified diseases and conditions complicating pregnancy, childbirth and the puerperium: Secondary | ICD-10-CM | POA: Diagnosis present

## 2014-10-16 DIAGNOSIS — O418X10 Other specified disorders of amniotic fluid and membranes, first trimester, not applicable or unspecified: Secondary | ICD-10-CM | POA: Diagnosis not present

## 2014-10-16 DIAGNOSIS — Z79899 Other long term (current) drug therapy: Secondary | ICD-10-CM | POA: Insufficient documentation

## 2014-10-16 DIAGNOSIS — E663 Overweight: Secondary | ICD-10-CM | POA: Diagnosis not present

## 2014-10-16 DIAGNOSIS — Z792 Long term (current) use of antibiotics: Secondary | ICD-10-CM | POA: Insufficient documentation

## 2014-10-16 DIAGNOSIS — Z8679 Personal history of other diseases of the circulatory system: Secondary | ICD-10-CM | POA: Insufficient documentation

## 2014-10-16 NOTE — ED Notes (Signed)
Pt c/o pain across lower abd since yesterday, nausea, no vomiting or diarrhea.

## 2014-10-17 ENCOUNTER — Emergency Department (HOSPITAL_COMMUNITY)
Admission: EM | Admit: 2014-10-17 | Discharge: 2014-10-17 | Disposition: A | Discharge status: Home / Self Care | Payer: 59 | Attending: Emergency Medicine | Admitting: Emergency Medicine

## 2014-10-17 ENCOUNTER — Emergency Department (HOSPITAL_COMMUNITY): Payer: 59

## 2014-10-17 DIAGNOSIS — O468X1 Other antepartum hemorrhage, first trimester: Secondary | ICD-10-CM

## 2014-10-17 DIAGNOSIS — O26899 Other specified pregnancy related conditions, unspecified trimester: Secondary | ICD-10-CM

## 2014-10-17 DIAGNOSIS — N390 Urinary tract infection, site not specified: Secondary | ICD-10-CM

## 2014-10-17 DIAGNOSIS — R1031 Right lower quadrant pain: Secondary | ICD-10-CM

## 2014-10-17 DIAGNOSIS — R109 Unspecified abdominal pain: Secondary | ICD-10-CM

## 2014-10-17 DIAGNOSIS — Z3490 Encounter for supervision of normal pregnancy, unspecified, unspecified trimester: Secondary | ICD-10-CM

## 2014-10-17 DIAGNOSIS — O418X10 Other specified disorders of amniotic fluid and membranes, first trimester, not applicable or unspecified: Secondary | ICD-10-CM

## 2014-10-17 DIAGNOSIS — R1032 Left lower quadrant pain: Secondary | ICD-10-CM

## 2014-10-17 DIAGNOSIS — A599 Trichomoniasis, unspecified: Secondary | ICD-10-CM

## 2014-10-17 LAB — CBC WITH DIFFERENTIAL/PLATELET
BASOS ABS: 0.1 10*3/uL (ref 0.0–0.1)
BASOS PCT: 0 % (ref 0–1)
EOS PCT: 0 % (ref 0–5)
Eosinophils Absolute: 0 10*3/uL (ref 0.0–0.7)
HEMATOCRIT: 33.1 % — AB (ref 36.0–46.0)
Hemoglobin: 10 g/dL — ABNORMAL LOW (ref 12.0–15.0)
Lymphocytes Relative: 27 % (ref 12–46)
Lymphs Abs: 3 10*3/uL (ref 0.7–4.0)
MCH: 19.2 pg — ABNORMAL LOW (ref 26.0–34.0)
MCHC: 30.2 g/dL (ref 30.0–36.0)
MCV: 63.4 fL — AB (ref 78.0–100.0)
MONO ABS: 0.5 10*3/uL (ref 0.1–1.0)
Monocytes Relative: 4 % (ref 3–12)
Neutro Abs: 7.9 10*3/uL — ABNORMAL HIGH (ref 1.7–7.7)
Neutrophils Relative %: 69 % (ref 43–77)
Platelets: 403 10*3/uL — ABNORMAL HIGH (ref 150–400)
RBC: 5.22 MIL/uL — ABNORMAL HIGH (ref 3.87–5.11)
RDW: 19.7 % — ABNORMAL HIGH (ref 11.5–15.5)
WBC: 11.5 10*3/uL — AB (ref 4.0–10.5)

## 2014-10-17 LAB — URINALYSIS, ROUTINE W REFLEX MICROSCOPIC
BILIRUBIN URINE: NEGATIVE
GLUCOSE, UA: NEGATIVE mg/dL
Hgb urine dipstick: NEGATIVE
KETONES UR: NEGATIVE mg/dL
Nitrite: NEGATIVE
PROTEIN: NEGATIVE mg/dL
Specific Gravity, Urine: 1.01 (ref 1.005–1.030)
UROBILINOGEN UA: 0.2 mg/dL (ref 0.0–1.0)
pH: 6 (ref 5.0–8.0)

## 2014-10-17 LAB — COMPREHENSIVE METABOLIC PANEL
ALT: 17 U/L (ref 14–54)
AST: 22 U/L (ref 15–41)
Albumin: 4.3 g/dL (ref 3.5–5.0)
Alkaline Phosphatase: 45 U/L (ref 38–126)
Anion gap: 9 (ref 5–15)
BILIRUBIN TOTAL: 0.5 mg/dL (ref 0.3–1.2)
BUN: 7 mg/dL (ref 6–20)
CHLORIDE: 104 mmol/L (ref 101–111)
CO2: 23 mmol/L (ref 22–32)
Calcium: 9.2 mg/dL (ref 8.9–10.3)
Creatinine, Ser: 0.58 mg/dL (ref 0.44–1.00)
GFR calc non Af Amer: >60 mL/min (ref >60)
GLUCOSE: 89 mg/dL (ref 65–99)
POTASSIUM: 3.2 mmol/L — AB (ref 3.5–5.1)
SODIUM: 136 mmol/L (ref 135–145)
TOTAL PROTEIN: 8.3 g/dL — AB (ref 6.5–8.1)

## 2014-10-17 LAB — WET PREP, GENITAL: YEAST WET PREP: NONE SEEN

## 2014-10-17 LAB — URINE MICROSCOPIC-ADD ON

## 2014-10-17 LAB — I-STAT BETA HCG BLOOD, ED (MC, WL, AP ONLY)

## 2014-10-17 LAB — PREGNANCY, URINE: Preg Test, Ur: POSITIVE — AB

## 2014-10-17 IMAGING — US US OB TRANSVAGINAL
1 series · 14 of 28 positions shown · non-contrast
Comparison: None.

CLINICAL DATA: Abdominal pain for 2 days, gestational age by
ultrasound 6 weeks and 4 days.

EXAM:
OBSTETRIC <14 WK US AND TRANSVAGINAL OB US
TECHNIQUE: Both transabdominal and transvaginal ultrasound examinations were
performed for complete evaluation of the gestation as well as the
maternal uterus, adnexal regions, and pelvic cul-de-sac.
Transvaginal technique was performed to assess early pregnancy.

[Series 1: us ob transvaginal · 0.24mm/px · 14 of 63 slices shown]
[im 3/63]
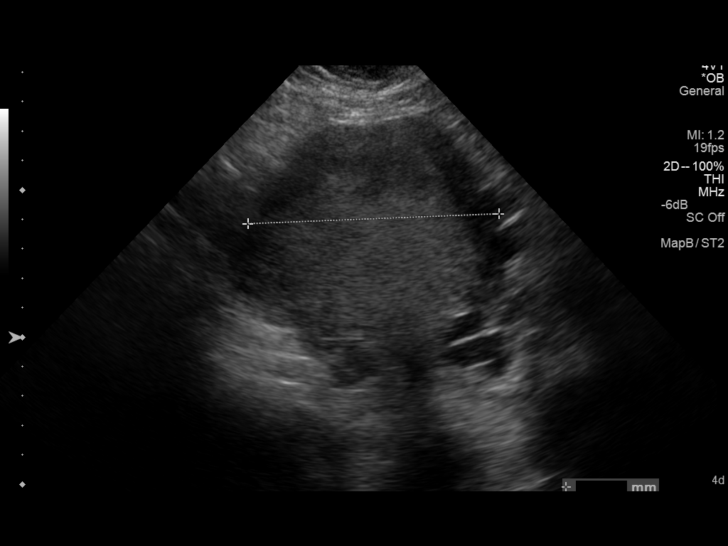
[im 7/63]
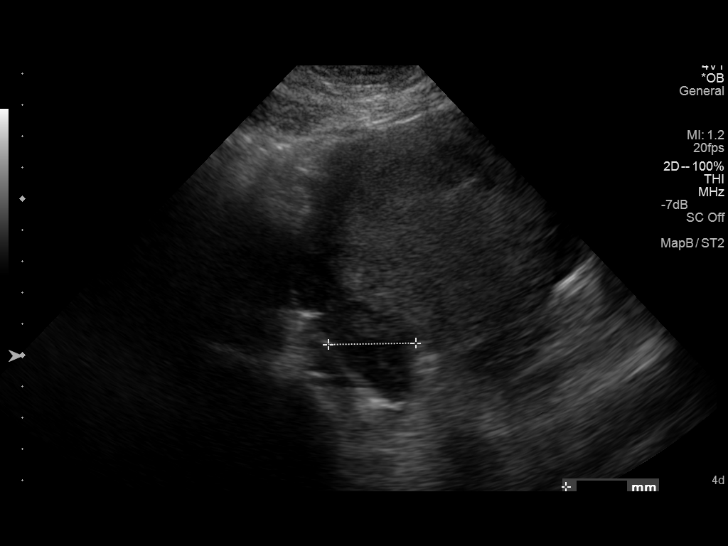
[im 12/63]
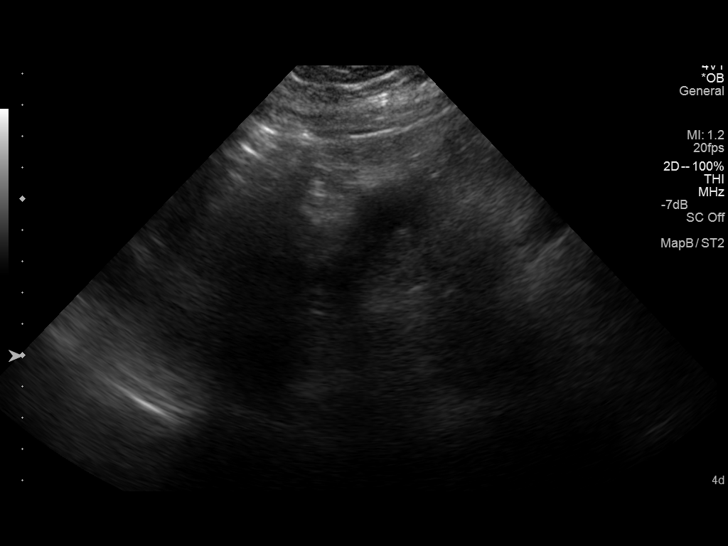
[im 17/63]
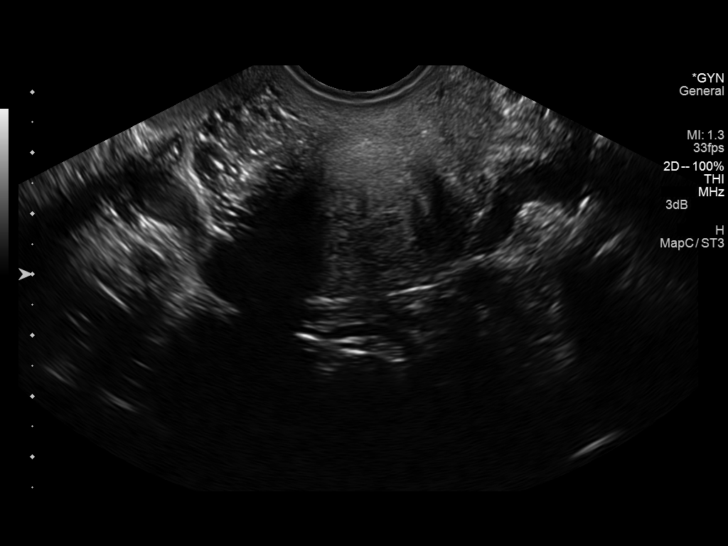
[im 21/63]
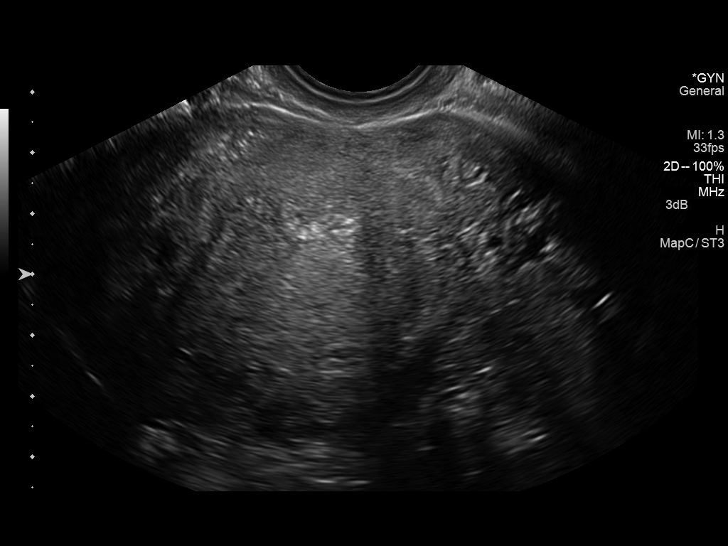
[im 26/63]
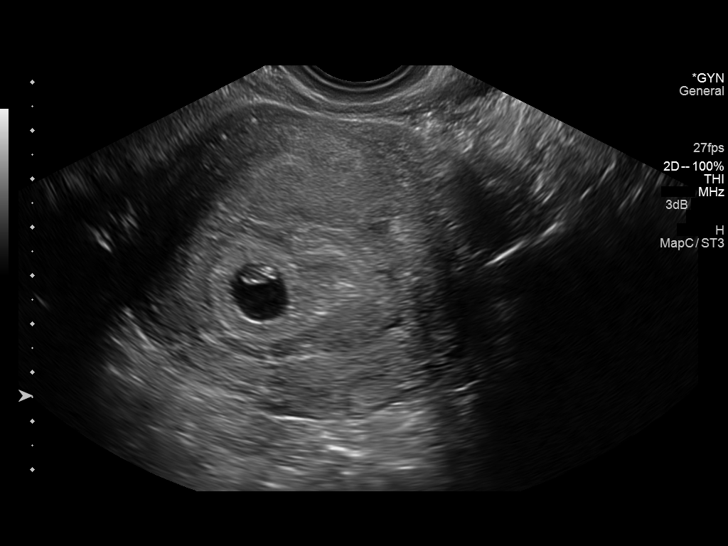
[im 30/63]
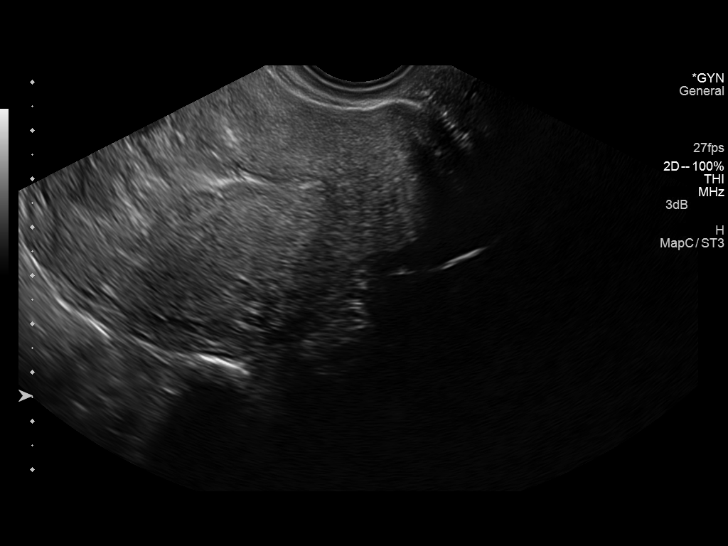
[im 35/63]
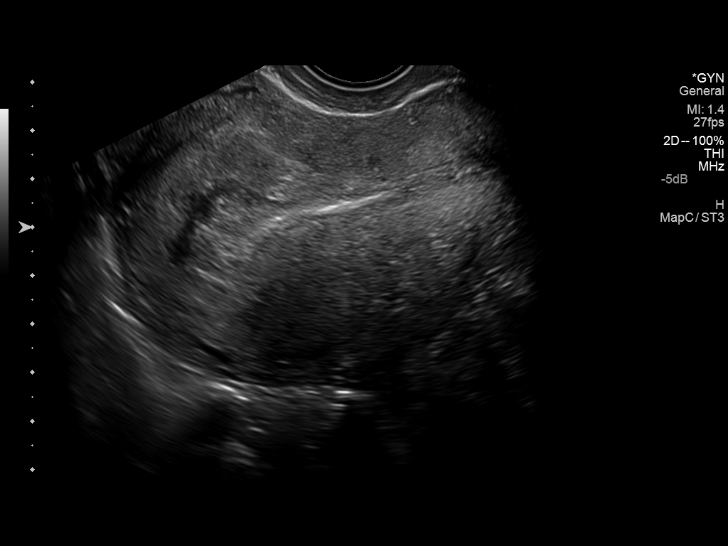
[im 40/63]
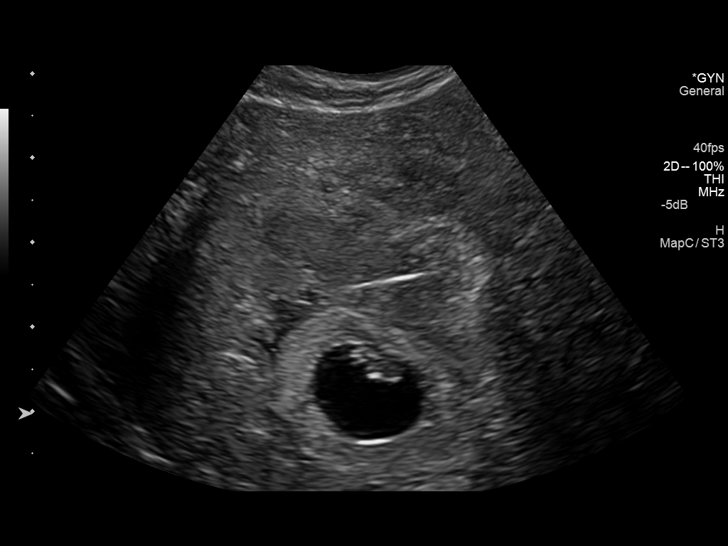
[im 44/63]
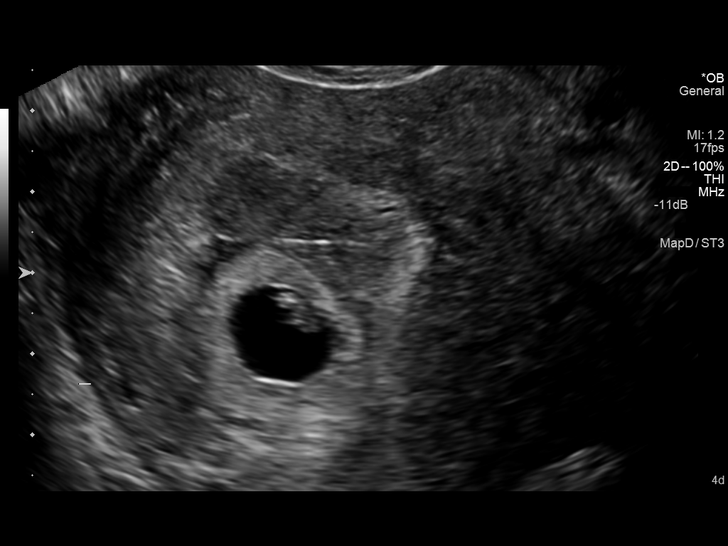
[im 49/63]
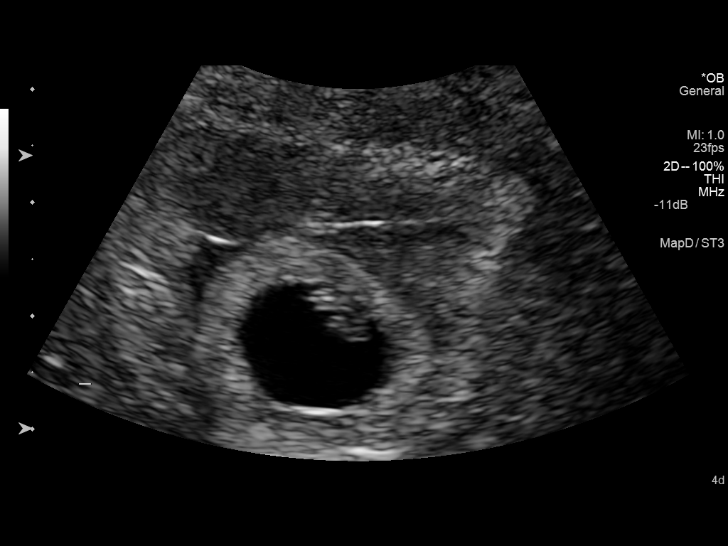
[im 53/63]
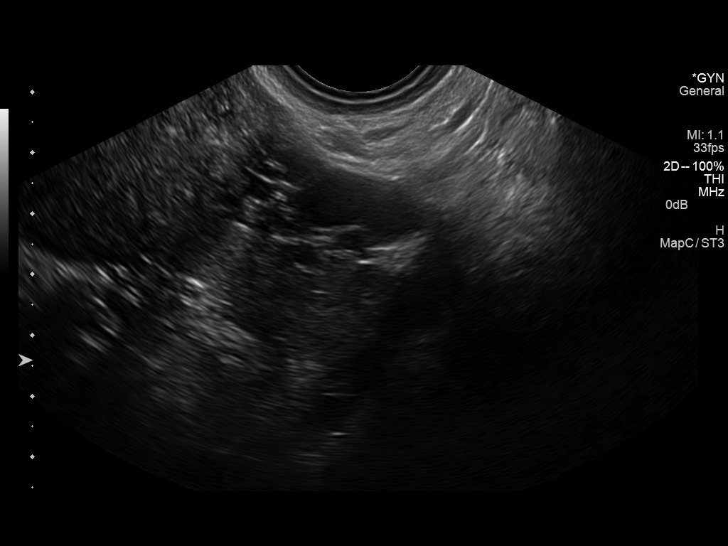
[im 58/63]
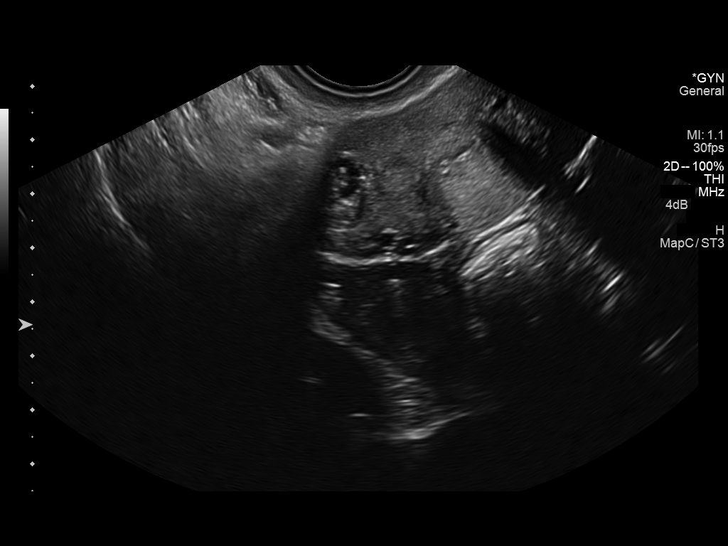
[im 63/63]
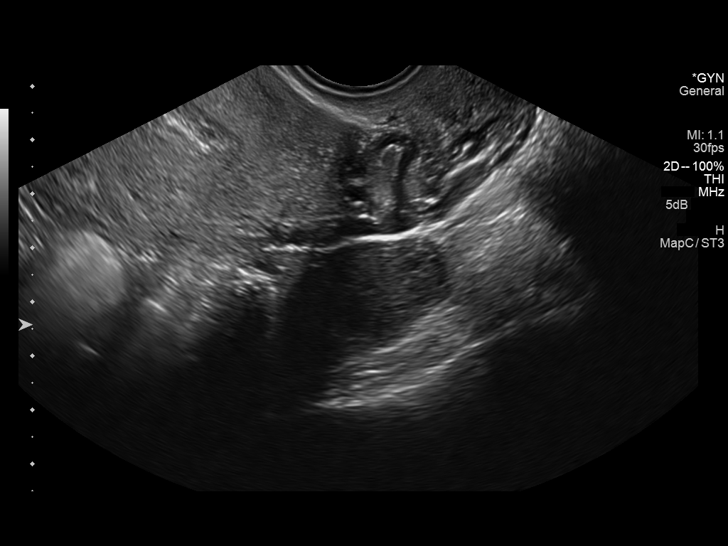

[14 of 28 positions shown; findings below may reference images not displayed]

FINDINGS: Intrauterine gestational sac: Visualized/normal in shape.

Yolk sac:  Present

Embryo:  Present

Cardiac Activity: Present

Heart Rate: 168  bpm

CRL:  6.7  mm   6 w   4 d                  US EDC: [DATE]

Maternal uterus/adnexae: Moderate subchorionic hemorrhage. Normal
appearance of the adnexae. No free fluid.
IMPRESSION: Single live intrauterine pregnancy, gestational age by ultrasound 6
weeks and 4 days with moderate subchorionic hemorrhage.

By: IDELFONSO

## 2014-10-17 MED ORDER — MORPHINE SULFATE 4 MG/ML IJ SOLN
4.0000 mg | Freq: Once | INTRAMUSCULAR | Status: AC
  Administered 2014-10-17: 4 mg via INTRAVENOUS
  Filled 2014-10-17: qty 1

## 2014-10-17 MED ORDER — ONDANSETRON HCL 4 MG/2ML IJ SOLN
4.0000 mg | Freq: Once | INTRAMUSCULAR | Status: DC
Start: 2014-10-17 — End: 2014-10-17
  Filled 2014-10-17: qty 2

## 2014-10-17 MED ORDER — ONDANSETRON HCL 4 MG/2ML IJ SOLN
4.0000 mg | Freq: Once | INTRAMUSCULAR | Status: AC
  Administered 2014-10-17: 4 mg via INTRAVENOUS

## 2014-10-17 MED ORDER — DEXTROSE 5 % IV SOLN
1.0000 g | Freq: Once | INTRAVENOUS | Status: AC
  Administered 2014-10-17: 1 g via INTRAVENOUS
  Filled 2014-10-17: qty 10

## 2014-10-17 MED ORDER — CEPHALEXIN 500 MG PO CAPS: 500.0000 mg | ORAL_CAPSULE | Freq: Two times a day (BID) | ORAL | Status: DC

## 2014-10-17 MED ORDER — ACETAMINOPHEN 500 MG PO TABS: 500.0000 mg | ORAL_TABLET | Freq: Four times a day (QID) | ORAL | Status: DC | PRN

## 2014-10-17 MED ORDER — AZITHROMYCIN 250 MG PO TABS
1000.0000 mg | ORAL_TABLET | Freq: Once | ORAL | Status: AC
  Administered 2014-10-17: 1000 mg via ORAL
  Filled 2014-10-17: qty 4

## 2014-10-17 MED ORDER — METRONIDAZOLE 500 MG PO TABS
2000.0000 mg | ORAL_TABLET | Freq: Once | ORAL | Status: AC
  Administered 2014-10-17: 2000 mg via ORAL
  Filled 2014-10-17: qty 4

## 2014-10-17 NOTE — ED Provider Notes (Signed)
CSN: [id number]     Arrival date & time 10/16/14  2315 History   First MD Initiated Contact with Patient 10/17/14 0110     Chief Complaint  Patient presents with  . Abdominal Pain     (Consider location/radiation/quality/duration/timing/severity/associated sxs/prior Treatment) HPI  This is a G9 P6 female  who presents with lower abdominal pain and nausea.  Patient reports suprapubic discomfort for 2 days. It is sharp and rated at 10 out of 10. It comes and goes. Denies any urinary frequency, urgency, dysuria, hematuria. Denies any vaginal discharge. Last menstrual period was April 4. She is monogamous in one relationship. Reports negative home pregnancy test. Reports associated diarrhea. She has had nausea without vomiting.  Past Medical History  Diagnosis Date  . Migraine    Past Surgical History  Procedure Laterality Date  . Cholecystectomy     No family history on file. History  Substance Use Topics  . Smoking status: Never Smoker   . Smokeless tobacco: Not on file  . Alcohol Use: No   OB History    Gravida Para Term Preterm AB TAB SAB Ectopic Multiple Living   9 6 6  0 2  1        Review of Systems  Constitutional: Negative for fever.  Respiratory: Negative for chest tightness and shortness of breath.   Cardiovascular: Negative for chest pain.  Gastrointestinal: Positive for nausea and abdominal pain. Negative for vomiting.  Genitourinary: Negative for dysuria and vaginal discharge.  Musculoskeletal: Negative for back pain.  Neurological: Negative for headaches.  All other systems reviewed and are negative.     Allergies  Review of patient's allergies indicates no known allergies.  Home Medications   Prior to Admission medications   Medication Sig Start Date End Date Taking? Authorizing Provider  acetaminophen (TYLENOL) 500 MG tablet Take 1 tablet (500 mg total) by mouth every 6 (six) hours as needed for moderate pain. 10/17/14   Merryl Hacker, MD   cephALEXin (KEFLEX) 500 MG capsule Take 1 capsule (500 mg total) by mouth 2 (two) times daily. 10/17/14   Merryl Hacker, MD  dicyclomine (BENTYL) 20 MG tablet Take 1 tablet (20 mg total) by mouth 2 (two) times daily. Patient not taking: Reported on 03/31/2014 07/04/13   Orpah Greek, MD  diphenoxylate-atropine (LOMOTIL) 2.5-0.025 MG per tablet Take 2 tablets by mouth 4 (four) times daily as needed for diarrhea or loose stools. Patient not taking: Reported on 03/31/2014 07/04/13   Orpah Greek, MD  doxycycline (VIBRAMYCIN) 100 MG capsule Take 1 capsule (100 mg total) by mouth 2 (two) times daily. One po bid x 7 days 03/31/14   Milton Ferguson, MD  ferrous sulfate 325 (65 FE) MG tablet Take 1 tablet (325 mg total) by mouth daily. Patient not taking: Reported on 03/31/2014 07/03/13   Orlie Dakin, MD  Fiber, Guar Gum, CHEW Chew 2 tablets by mouth daily.    Historical Provider, MD  HYDROcodone-acetaminophen (NORCO/VICODIN) 5-325 MG per tablet Take 1 tablet by mouth every 6 (six) hours as needed. 03/31/14   Milton Ferguson, MD  ibuprofen (ADVIL,MOTRIN) 200 MG tablet Take 200 mg by mouth every 6 (six) hours as needed for mild pain or moderate pain.    Historical Provider, MD  ibuprofen (ADVIL,MOTRIN) 600 MG tablet Take 1 tablet (600 mg total) by mouth every 6 (six) hours as needed for pain. Patient not taking: Reported on 03/31/2014 06/09/12   Rhunette Croft, MD  metroNIDAZOLE (FLAGYL) 500 MG tablet  Take 1 tablet (500 mg total) by mouth 2 (two) times daily. Patient not taking: Reported on 03/31/2014 10/02/13   Ezequiel Essex, MD  ondansetron (ZOFRAN) 4 MG tablet Take 1 tablet (4 mg total) by mouth every 6 (six) hours. Patient not taking: Reported on 03/31/2014 07/04/13   Orpah Greek, MD   BP 113/40 mmHg  Pulse 84  Temp(Src) 99 F (37.2 C) (Oral)  Resp 17  Ht 5\' 5"  (1.651 m)  Wt 210 lb (95.255 kg)  BMI 34.95 kg/m2  SpO2 100%  LMP 09/01/2014 Physical Exam  Constitutional: She is  oriented to person, place, and time. She appears well-developed and well-nourished. No distress.  Overweight  HENT:  Head: Normocephalic and atraumatic.  Cardiovascular: Normal rate, regular rhythm and normal heart sounds.   No murmur heard. Pulmonary/Chest: Effort normal and breath sounds normal. No respiratory distress. She has no wheezes.  Abdominal: Soft. Bowel sounds are normal. There is tenderness. There is no rebound and no guarding.  Suprapubic tenderness palpation without rebound or guarding  Genitourinary: Vagina normal.  No adnexal tenderness noted, cervical os closed, no bleeding noted, moderate vaginal discharge  Neurological: She is alert and oriented to person, place, and time.  Skin: Skin is warm and dry.  Psychiatric: She has a normal mood and affect.  Nursing note and vitals reviewed.   ED Course  Procedures (including critical care time) Labs Review Labs Reviewed  WET PREP, GENITAL - Abnormal; Notable for the following:    Trich, Wet Prep FEW (*)    Clue Cells Wet Prep HPF POC FEW (*)    WBC, Wet Prep HPF POC FEW (*)    All other components within normal limits  URINALYSIS, ROUTINE W REFLEX MICROSCOPIC - Abnormal; Notable for the following:    Color, Urine STRAW (*)    APPearance HAZY (*)    Leukocytes, UA MODERATE (*)    All other components within normal limits  PREGNANCY, URINE - Abnormal; Notable for the following:    Preg Test, Ur POSITIVE (*)    All other components within normal limits  CBC WITH DIFFERENTIAL/PLATELET - Abnormal; Notable for the following:    WBC 11.5 (*)    RBC 5.22 (*)    Hemoglobin 10.0 (*)    HCT 33.1 (*)    MCV 63.4 (*)    MCH 19.2 (*)    RDW 19.7 (*)    Platelets 403 (*)    Neutro Abs 7.9 (*)    All other components within normal limits  COMPREHENSIVE METABOLIC PANEL - Abnormal; Notable for the following:    Potassium 3.2 (*)    Total Protein 8.3 (*)    All other components within normal limits  URINE MICROSCOPIC-ADD ON  - Abnormal; Notable for the following:    Squamous Epithelial / LPF MANY (*)    Bacteria, UA MANY (*)    All other components within normal limits  I-STAT BETA HCG BLOOD, ED ([facility], [facility], [facility] ONLY) - Abnormal; Notable for the following:    I-stat hCG, quantitative >2000.0 (*)    All other components within normal limits  URINE CULTURE  RPR  HIV ANTIBODY (ROUTINE TESTING)  GC/CHLAMYDIA PROBE AMP ([place])    Imaging Review US Ob Comp Less 14 Wks  10/17/2014   CLINICAL DATA:  Abdominal pain for 2 days, gestational age by ultrasound 6 weeks and 4 days.  EXAM: OBSTETRIC <14 WK [place] AND TRANSVAGINAL OB US  TECHNIQUE: Both transabdominal and transvaginal ultrasound examinations  were performed for complete evaluation of the gestation as well as the maternal uterus, adnexal regions, and pelvic cul-de-sac. Transvaginal technique was performed to assess early pregnancy.  COMPARISON:  None.  FINDINGS: Intrauterine gestational sac: Visualized/normal in shape.  Yolk sac:  Present  Embryo:  Present  Cardiac Activity: Present  Heart Rate: 168  bpm  CRL:  6.7  mm   6 w   4 d                  [place] EDC: June 08, 2015  Maternal uterus/adnexae: Moderate subchorionic hemorrhage. Normal appearance of the adnexae. No free fluid.  IMPRESSION: Single live intrauterine pregnancy, gestational age by ultrasound 6 weeks and 4 days with moderate subchorionic hemorrhage.   Electronically Signed   By: Elon Alas   On: 10/17/2014 04:16   US Ob Transvaginal  10/17/2014   CLINICAL DATA:  Abdominal pain for 2 days, gestational age by ultrasound 6 weeks and 4 days.  EXAM: OBSTETRIC <14 WK [place] AND TRANSVAGINAL OB US  TECHNIQUE: Both transabdominal and transvaginal ultrasound examinations were performed for complete evaluation of the gestation as well as the maternal uterus, adnexal regions, and pelvic cul-de-sac. Transvaginal technique was performed to assess early pregnancy.  COMPARISON:  None.  FINDINGS: Intrauterine gestational  sac: Visualized/normal in shape.  Yolk sac:  Present  Embryo:  Present  Cardiac Activity: Present  Heart Rate: 168  bpm  CRL:  6.7  mm   6 w   4 d                  [place] EDC: June 08, 2015  Maternal uterus/adnexae: Moderate subchorionic hemorrhage. Normal appearance of the adnexae. No free fluid.  IMPRESSION: Single live intrauterine pregnancy, gestational age by ultrasound 6 weeks and 4 days with moderate subchorionic hemorrhage.   Electronically Signed   By: Elon Alas   On: 10/17/2014 04:16     EKG Interpretation None      MDM   Final diagnoses:  Abdominal pain during pregnancy  Subchorionic hemorrhage in first trimester  Trichomonal infection  UTI (lower urinary tract infection)    Patient presents with lower abdominal pain. Last menstrual period April 4 but reports negative urine pregnancy tests at home. Nonlateralizing pain. Lab work sent including urinalysis and urine pregnancy. Urine pregnancy is positive. Beta hCG greater than 2000. While pain is nonlateralizing, would be concerned for ectopic. Ultrasound ordered. Urine with evidence of urinary tract infection and trichomonas. Patient was treated with 2 g of Flagyl. Patient was also tested for [facility] and chlamydia. She was empirically treated for these as well. Ultrasound shows a 6 week 4 day pregnancy with a moderate subchorionic hemorrhage. No vaginal bleeding on exam.  Discussed results with patient. Will discharge home with Keflex for urinary tract infection. Tylenol for pain. Patient does not have a primary obstetrician. Patient given contact information for Dr. Glo Herring who is on call.  After history, exam, and medical workup I feel the patient has been appropriately medically screened and is safe for discharge home. Pertinent diagnoses were discussed with the patient. Patient was given return precautions.   Merryl Hacker, MD 10/17/14 [phone]

## 2014-10-17 NOTE — ED Notes (Signed)
Dr Dina Rich at bedside,

## 2014-10-17 NOTE — Discharge Instructions (Signed)
You were seen for abdominal pain during pregnancy. You have a small uterine hemorrhage that could cause pain. This may also cause bleeding in the future.  You will also noted to have a urinary tract infection and trichomonas. You were treated for these. You should follow-up with her OB/GYN.  Abdominal Pain During Pregnancy Abdominal pain is common in pregnancy. Most of the time, it does not cause harm. There are many causes of abdominal pain. Some causes are more serious than others. Some of the causes of abdominal pain in pregnancy are easily diagnosed. Occasionally, the diagnosis takes time to understand. Other times, the cause is not determined. Abdominal pain can be a sign that something is very wrong with the pregnancy, or the pain may have nothing to do with the pregnancy at all. For this reason, always tell your health care provider if you have any abdominal discomfort. HOME CARE INSTRUCTIONS  Monitor your abdominal pain for any changes. The following actions may help to alleviate any discomfort you are experiencing:  Do not have sexual intercourse or put anything in your vagina until your symptoms go away completely.  Get plenty of rest until your pain improves.  Drink clear fluids if you feel nauseous. Avoid solid food as long as you are uncomfortable or nauseous.  Only take over-the-counter or prescription medicine as directed by your health care provider.  Keep all follow-up appointments with your health care provider. SEEK IMMEDIATE MEDICAL CARE IF:  You are bleeding, leaking fluid, or passing tissue from the vagina.  You have increasing pain or cramping.  You have persistent vomiting.  You have painful or bloody urination.  You have a fever.  You notice a decrease in your baby's movements.  You have extreme weakness or feel faint.  You have shortness of breath, with or without abdominal pain.  You develop a severe headache with abdominal pain.  You have abnormal  vaginal discharge with abdominal pain.  You have persistent diarrhea.  You have abdominal pain that continues even after rest, or gets worse. MAKE SURE YOU:   Understand these instructions.  Will watch your condition.  Will get help right away if you are not doing well or get worse. Document Released: 05/16/2005 Document Revised: 03/06/2013 Document Reviewed: 12/13/2012 [facility] Patient Information 2015 [place], [place]. This information is not intended to replace advice given to you by your health care provider. Make sure you discuss any questions you have with your health care provider. Subchorionic Hematoma A subchorionic hematoma is a gathering of blood between the outer wall of the placenta and the inner wall of the womb (uterus). The placenta is the organ that connects the fetus to the wall of the uterus. The placenta performs the feeding, breathing (oxygen to the fetus), and waste removal (excretory work) of the fetus.  Subchorionic hematoma is the most common abnormality found on a result from ultrasonography done during the first trimester or early second trimester of pregnancy. If there has been little or no vaginal bleeding, early small hematomas usually shrink on their own and do not affect your baby or pregnancy. The blood is gradually absorbed over 1-2 weeks. When bleeding starts later in pregnancy or the hematoma is larger or occurs in an older pregnant woman, the outcome may not be as good. Larger hematomas may get bigger, which increases the chances for miscarriage. Subchorionic hematoma also increases the risk of premature detachment of the placenta from the uterus, preterm (premature) labor, and stillbirth. [place]  Stay  on bed rest if your health care provider recommends this. Although bed rest will not prevent more bleeding or prevent a miscarriage, your health care provider may recommend bed rest until you are advised otherwise.  Avoid heavy lifting (more than  10 lb [4.5 kg]), exercise, sexual intercourse, or douching as directed by your health care provider.  Keep track of the number of pads you use each day and how soaked (saturated) they are. Write down this information.  Do not use tampons.  Keep all follow-up appointments as directed by your health care provider. Your health care provider may ask you to have follow-up blood tests or ultrasound tests or both. SEEK IMMEDIATE MEDICAL CARE IF:  You have severe cramps in your stomach, back, abdomen, or pelvis.  You have a fever.  You pass large clots or tissue. Save any tissue for your health care provider to look at.  Your bleeding increases or you become lightheaded, feel weak, or have fainting episodes. Document Released: 08/31/2006 Document Revised: 09/30/2013 Document Reviewed: 12/13/2012 [facility] Patient Information 2015 [place], [place]. This information is not intended to replace advice given to you by your health care provider. Make sure you discuss any questions you have with your health care provider. Urinary Tract Infection Urinary tract infections (UTIs) can develop anywhere along your urinary tract. Your urinary tract is your body's drainage system for removing wastes and extra water. Your urinary tract includes two kidneys, two ureters, a bladder, and a urethra. Your kidneys are a pair of bean-shaped organs. Each kidney is about the size of your fist. They are located below your ribs, one on each side of your spine. CAUSES Infections are caused by microbes, which are microscopic organisms, including fungi, viruses, and bacteria. These organisms are so small that they can only be seen through a microscope. Bacteria are the microbes that most commonly cause UTIs. SYMPTOMS  Symptoms of UTIs may vary by age and gender of the patient and by the location of the infection. Symptoms in young women typically include a frequent and intense urge to urinate and a painful, burning feeling in the  bladder or urethra during urination. Older women and men are more likely to be tired, shaky, and weak and have muscle aches and abdominal pain. A fever may mean the infection is in your kidneys. Other symptoms of a kidney infection include pain in your back or sides below the ribs, nausea, and vomiting. DIAGNOSIS To diagnose a UTI, your caregiver will ask you about your symptoms. Your caregiver also will ask to provide a urine sample. The urine sample will be tested for bacteria and white blood cells. White blood cells are made by your body to help fight infection. TREATMENT  Typically, UTIs can be treated with medication. Because most UTIs are caused by a bacterial infection, they usually can be treated with the use of antibiotics. The choice of antibiotic and length of treatment depend on your symptoms and the type of bacteria causing your infection. HOME CARE INSTRUCTIONS  If you were prescribed antibiotics, take them exactly as your caregiver instructs you. Finish the medication even if you feel better after you have only taken some of the medication.  Drink enough water and fluids to keep your urine clear or pale yellow.  Avoid caffeine, tea, and carbonated beverages. They tend to irritate your bladder.  Empty your bladder often. Avoid holding urine for long periods of time.  Empty your bladder before and after sexual intercourse.  After a bowel movement,  women should cleanse from front to back. Use each tissue only once. SEEK MEDICAL CARE IF:   You have back pain.  You develop a fever.  Your symptoms do not begin to resolve within 3 days. SEEK IMMEDIATE MEDICAL CARE IF:   You have severe back pain or lower abdominal pain.  You develop chills.  You have nausea or vomiting.  You have continued burning or discomfort with urination. MAKE SURE YOU:   Understand these instructions.  Will watch your condition.  Will get help right away if you are not doing well or get  worse. Document Released: 02/23/2005 Document Revised: 11/15/2011 Document Reviewed: 06/24/2011 [facility] Patient Information 2015 [place], [place]. This information is not intended to replace advice given to you by your health care provider. Make sure you discuss any questions you have with your health care provider. Trichomoniasis Trichomoniasis is an infection caused by an organism called Trichomonas. The infection can affect both women and men. In women, the outer female genitalia and the vagina are affected. In men, the penis is mainly affected, but the prostate and other reproductive organs can also be involved. Trichomoniasis is a sexually transmitted infection (STI) and is most often passed to another person through sexual contact.  RISK FACTORS  Having unprotected sexual intercourse.  Having sexual intercourse with an infected partner. SIGNS AND SYMPTOMS  Symptoms of trichomoniasis in women include:  Abnormal gray-green frothy vaginal discharge.  Itching and irritation of the vagina.  Itching and irritation of the area outside the vagina. Symptoms of trichomoniasis in men include:   Penile discharge with or without pain.  Pain during urination. This results from inflammation of the urethra. DIAGNOSIS  Trichomoniasis may be found during a Pap test or physical exam. Your health care provider may use one of the following methods to help diagnose this infection:  Examining vaginal discharge under a microscope. For men, urethral discharge would be examined.  Testing the pH of the vagina with a test tape.  Using a vaginal swab test that checks for the Trichomonas organism. A test is available that provides results within a few minutes.  Doing a culture test for the organism. This is not usually needed. TREATMENT   You may be given medicine to fight the infection. Women should inform their health care provider if they could be or are pregnant. Some medicines used to treat the  infection should not be taken during pregnancy.  Your health care provider may recommend over-the-counter medicines or creams to decrease itching or irritation.  Your sexual partner will need to be treated if infected. HOME CARE INSTRUCTIONS   Take medicines only as directed by your health care provider.  Take over-the-counter medicine for itching or irritation as directed by your health care provider.  Do not have sexual intercourse while you have the infection.  Women should not douche or wear tampons while they have the infection.  Discuss your infection with your partner. Your partner may have gotten the infection from you, or you may have gotten it from your partner.  Have your sex partner get examined and treated if necessary.  Practice safe, informed, and protected sex.  See your health care provider for other STI testing. SEEK MEDICAL CARE IF:   You still have symptoms after you finish your medicine.  You develop abdominal pain.  You have pain when you urinate.  You have bleeding after sexual intercourse.  You develop a rash.  Your medicine makes you sick or makes you throw up (  vomit). MAKE SURE YOU:  Understand these instructions.  Will watch your condition.  Will get help right away if you are not doing well or get worse. Document Released: 11/09/2000 Document Revised: 09/30/2013 Document Reviewed: 02/25/2013 [facility] Patient Information 2015 [place], [place]. This information is not intended to replace advice given to you by your health care provider. Make sure you discuss any questions you have with your health care provider.

## 2014-10-17 NOTE — ED Notes (Signed)
Pt waiting for ultrasound.

## 2014-10-17 NOTE — ED Notes (Signed)
Pelvic exam performed by Dr Dina Rich, pt tolerated well,

## 2014-10-17 NOTE — ED Notes (Signed)
PT c/o lower abd pain that started two days ago with nausea, EDP in prior to RN, see edp assessment for further,

## 2014-10-17 NOTE — ED Notes (Signed)
Pt updated on plan of care,  

## 2014-10-17 NOTE — ED Notes (Signed)
Dr Dina Rich at bedside speaking with pt,

## 2014-10-17 NOTE — ED Notes (Signed)
Pt ambulatory to restroom, tolerated well, update given,

## 2014-10-18 LAB — HIV ANTIBODY (ROUTINE TESTING W REFLEX): HIV Screen 4th Generation wRfx: NONREACTIVE

## 2014-10-18 LAB — RPR: RPR Ser Ql: NONREACTIVE

## 2014-10-19 LAB — URINE CULTURE: Colony Count: 70000

## 2014-10-20 LAB — GC/CHLAMYDIA PROBE AMP (~~LOC~~) NOT AT ARMC
Chlamydia: NEGATIVE
Neisseria Gonorrhea: NEGATIVE

## 2014-11-10 ENCOUNTER — Emergency Department (HOSPITAL_COMMUNITY): Payer: 59

## 2014-11-10 ENCOUNTER — Emergency Department (HOSPITAL_COMMUNITY)
Admission: EM | Admit: 2014-11-10 | Discharge: 2014-11-10 | Disposition: A | Discharge status: Home / Self Care | Payer: 59 | Attending: Emergency Medicine | Admitting: Emergency Medicine

## 2014-11-10 ENCOUNTER — Encounter (HOSPITAL_COMMUNITY): Payer: Self-pay | Admitting: Emergency Medicine

## 2014-11-10 DIAGNOSIS — O9989 Other specified diseases and conditions complicating pregnancy, childbirth and the puerperium: Secondary | ICD-10-CM | POA: Insufficient documentation

## 2014-11-10 DIAGNOSIS — R079 Chest pain, unspecified: Secondary | ICD-10-CM | POA: Diagnosis not present

## 2014-11-10 DIAGNOSIS — R109 Unspecified abdominal pain: Secondary | ICD-10-CM | POA: Insufficient documentation

## 2014-11-10 DIAGNOSIS — Z3490 Encounter for supervision of normal pregnancy, unspecified, unspecified trimester: Secondary | ICD-10-CM

## 2014-11-10 DIAGNOSIS — Z8679 Personal history of other diseases of the circulatory system: Secondary | ICD-10-CM | POA: Insufficient documentation

## 2014-11-10 DIAGNOSIS — Z3A10 10 weeks gestation of pregnancy: Secondary | ICD-10-CM | POA: Diagnosis not present

## 2014-11-10 DIAGNOSIS — R0602 Shortness of breath: Secondary | ICD-10-CM | POA: Diagnosis not present

## 2014-11-10 DIAGNOSIS — R1030 Lower abdominal pain, unspecified: Secondary | ICD-10-CM

## 2014-11-10 DIAGNOSIS — Z09 Encounter for follow-up examination after completed treatment for conditions other than malignant neoplasm: Secondary | ICD-10-CM

## 2014-11-10 DIAGNOSIS — O468X1 Other antepartum hemorrhage, first trimester: Secondary | ICD-10-CM

## 2014-11-10 DIAGNOSIS — O418X10 Other specified disorders of amniotic fluid and membranes, first trimester, not applicable or unspecified: Secondary | ICD-10-CM

## 2014-11-10 DIAGNOSIS — M549 Dorsalgia, unspecified: Secondary | ICD-10-CM

## 2014-11-10 DIAGNOSIS — O99891 Other specified diseases and conditions complicating pregnancy: Secondary | ICD-10-CM

## 2014-11-10 LAB — HCG, QUANTITATIVE, PREGNANCY: HCG, BETA CHAIN, QUANT, S: 70347 m[IU]/mL — AB (ref <5)

## 2014-11-10 LAB — URINALYSIS, ROUTINE W REFLEX MICROSCOPIC
Bilirubin Urine: NEGATIVE
Glucose, UA: NEGATIVE mg/dL
Hgb urine dipstick: NEGATIVE
KETONES UR: NEGATIVE mg/dL
LEUKOCYTES UA: NEGATIVE
NITRITE: NEGATIVE
PROTEIN: NEGATIVE mg/dL
Specific Gravity, Urine: 1.01 (ref 1.005–1.030)
Urobilinogen, UA: 0.2 mg/dL (ref 0.0–1.0)
pH: 5.5 (ref 5.0–8.0)

## 2014-11-10 LAB — BASIC METABOLIC PANEL
ANION GAP: 8 (ref 5–15)
BUN: 5 mg/dL — ABNORMAL LOW (ref 6–20)
CHLORIDE: 104 mmol/L (ref 101–111)
CO2: 24 mmol/L (ref 22–32)
Calcium: 8.9 mg/dL (ref 8.9–10.3)
Creatinine, Ser: 0.49 mg/dL (ref 0.44–1.00)
GFR calc Af Amer: >60 mL/min (ref >60)
GFR calc non Af Amer: >60 mL/min (ref >60)
Glucose, Bld: 95 mg/dL (ref 65–99)
Potassium: 3.7 mmol/L (ref 3.5–5.1)
Sodium: 136 mmol/L (ref 135–145)

## 2014-11-10 LAB — CBC WITH DIFFERENTIAL/PLATELET
Basophils Absolute: 0 10*3/uL (ref 0.0–0.1)
Basophils Relative: 0 % (ref 0–1)
EOS ABS: 0 10*3/uL (ref 0.0–0.7)
Eosinophils Relative: 0 % (ref 0–5)
HCT: 33.8 % — ABNORMAL LOW (ref 36.0–46.0)
Hemoglobin: 10.4 g/dL — ABNORMAL LOW (ref 12.0–15.0)
LYMPHS ABS: 2.2 10*3/uL (ref 0.7–4.0)
Lymphocytes Relative: 21 % (ref 12–46)
MCH: 19.8 pg — AB (ref 26.0–34.0)
MCHC: 30.8 g/dL (ref 30.0–36.0)
MCV: 64.4 fL — AB (ref 78.0–100.0)
MONO ABS: 0.6 10*3/uL (ref 0.1–1.0)
MONOS PCT: 6 % (ref 3–12)
Neutro Abs: 7.9 10*3/uL — ABNORMAL HIGH (ref 1.7–7.7)
Neutrophils Relative %: 73 % (ref 43–77)
Platelets: 353 10*3/uL (ref 150–400)
RBC: 5.25 MIL/uL — ABNORMAL HIGH (ref 3.87–5.11)
RDW: 20.3 % — AB (ref 11.5–15.5)
WBC: 10.7 10*3/uL — ABNORMAL HIGH (ref 4.0–10.5)

## 2014-11-10 LAB — TROPONIN I: Troponin I: <0.03 ng/mL (ref <0.031)

## 2014-11-10 LAB — D-DIMER, QUANTITATIVE: D-Dimer, Quant: <0.27 ug/mL-FEU (ref 0.00–0.48)

## 2014-11-10 MED ORDER — PROMETHAZINE HCL 25 MG PO TABS: 25.0000 mg | ORAL_TABLET | Freq: Four times a day (QID) | ORAL | Status: DC | PRN

## 2014-11-10 MED ORDER — SODIUM CHLORIDE 0.9 % IV BOLUS (SEPSIS)
1000.0000 mL | Freq: Once | INTRAVENOUS | Status: AC
  Administered 2014-11-10: 1000 mL via INTRAVENOUS

## 2014-11-10 MED ORDER — MORPHINE SULFATE 4 MG/ML IJ SOLN
4.0000 mg | INTRAMUSCULAR | Status: DC | PRN
  Administered 2014-11-10: 4 mg via INTRAVENOUS
  Filled 2014-11-10: qty 1

## 2014-11-10 NOTE — ED Notes (Signed)
Pt sleeping at this time.

## 2014-11-10 NOTE — ED Notes (Addendum)
Patient complaining of chest pain with shortness of breath radiating into back and abdomen starting this morning at approximately 0800. Also states she vomited once today. Patient states she is pregnant but is unsure how far along she is. States LMP was in April.

## 2014-11-10 NOTE — Discharge Instructions (Signed)
Tests were good. You are approximately [redacted] weeks pregnant. You'll need to find an OB/GYN doctor. Medication for nausea. Tylenol for pain

## 2014-11-10 NOTE — ED Provider Notes (Signed)
CSN: [id number]     Arrival date & time 11/10/14  1406 History   First MD Initiated Contact with Patient 11/10/14 1504     Chief Complaint  Patient presents with  . Chest Pain  . Shortness of Breath     (Consider location/radiation/quality/duration/timing/severity/associated sxs/prior Treatment) HPI.... G9 P6 AB 2 LMP April 2016 presents with abdominal pain and chest pain since this morning. No vaginal bleeding or vaginal discharge. No dysuria, fever, chills. Severity is mild. Nothing makes symptoms better or worse. She has had no prenatal care yet.  Past Medical History  Diagnosis Date  . Migraine    Past Surgical History  Procedure Laterality Date  . Cholecystectomy     History reviewed. No pertinent family history. History  Substance Use Topics  . Smoking status: Never Smoker   . Smokeless tobacco: Not on file  . Alcohol Use: No   OB History    Gravida Para Term Preterm AB TAB SAB Ectopic Multiple Living   9 6 6  0 2  1        Review of Systems  All other systems reviewed and are negative.     Allergies  Review of patient's allergies indicates no known allergies.  Home Medications   Prior to Admission medications   Medication Sig Start Date End Date Taking? Authorizing Provider  acetaminophen (TYLENOL) 500 MG tablet Take 1 tablet (500 mg total) by mouth every 6 (six) hours as needed for moderate pain. 10/17/14  Yes Merryl Hacker, MD  ibuprofen (ADVIL,MOTRIN) 200 MG tablet Take 200 mg by mouth every 6 (six) hours as needed for mild pain or moderate pain.   Yes Historical Provider, MD  naproxen sodium (ALEVE) 220 MG tablet Take 220-440 mg by mouth daily as needed (for pain).   Yes Historical Provider, MD  cephALEXin (KEFLEX) 500 MG capsule Take 1 capsule (500 mg total) by mouth 2 (two) times daily. Patient not taking: Reported on 11/10/2014 10/17/14   Merryl Hacker, MD  dicyclomine (BENTYL) 20 MG tablet Take 1 tablet (20 mg total) by mouth 2 (two) times  daily. Patient not taking: Reported on 03/31/2014 07/04/13   Orpah Greek, MD  diphenoxylate-atropine (LOMOTIL) 2.5-0.025 MG per tablet Take 2 tablets by mouth 4 (four) times daily as needed for diarrhea or loose stools. Patient not taking: Reported on 03/31/2014 07/04/13   Orpah Greek, MD  doxycycline (VIBRAMYCIN) 100 MG capsule Take 1 capsule (100 mg total) by mouth 2 (two) times daily. One po bid x 7 days Patient not taking: Reported on 11/10/2014 03/31/14   Milton Ferguson, MD  HYDROcodone-acetaminophen (NORCO/VICODIN) 5-325 MG per tablet Take 1 tablet by mouth every 6 (six) hours as needed. Patient not taking: Reported on 11/10/2014 03/31/14   Milton Ferguson, MD  metroNIDAZOLE (FLAGYL) 500 MG tablet Take 1 tablet (500 mg total) by mouth 2 (two) times daily. Patient not taking: Reported on 03/31/2014 10/02/13   Ezequiel Essex, MD  ondansetron (ZOFRAN) 4 MG tablet Take 1 tablet (4 mg total) by mouth every 6 (six) hours. Patient not taking: Reported on 03/31/2014 07/04/13   Orpah Greek, MD  promethazine (PHENERGAN) 25 MG tablet Take 1 tablet (25 mg total) by mouth every 6 (six) hours as needed. 11/10/14   Nat Christen, MD   BP 123/71 mmHg  Pulse 72  Temp(Src) 99 F (37.2 C) (Oral)  Resp 18  Ht 5\' 5"  (1.651 m)  Wt 204 lb (92.534 kg)  BMI 33.95 kg/m2  SpO2  100%  LMP 09/01/2014 Physical Exam  Constitutional: She is oriented to person, place, and time. She appears well-developed and well-nourished.  HENT:  Head: Normocephalic and atraumatic.  Eyes: Conjunctivae and EOM are normal. Pupils are equal, round, and reactive to light.  Neck: Normal range of motion. Neck supple.  Cardiovascular: Normal rate and regular rhythm.   Pulmonary/Chest: Effort normal and breath sounds normal.  Abdominal: Soft. Bowel sounds are normal.  Musculoskeletal: Normal range of motion.  Neurological: She is alert and oriented to person, place, and time.  Skin: Skin is warm and dry.  Psychiatric: She  has a normal mood and affect. Her behavior is normal.  Nursing note and vitals reviewed.   ED Course  Procedures (including critical care time) Labs Review Labs Reviewed  CBC WITH DIFFERENTIAL/PLATELET - Abnormal; Notable for the following:    WBC 10.7 (*)    RBC 5.25 (*)    Hemoglobin 10.4 (*)    HCT 33.8 (*)    MCV 64.4 (*)    MCH 19.8 (*)    RDW 20.3 (*)    Neutro Abs 7.9 (*)    All other components within normal limits  BASIC METABOLIC PANEL - Abnormal; Notable for the following:    BUN 5 (*)    All other components within normal limits  HCG, QUANTITATIVE, PREGNANCY - Abnormal; Notable for the following:    hCG, Beta Chain, Laqueta Carina [id number] (*)    All other components within normal limits  URINALYSIS, ROUTINE W REFLEX MICROSCOPIC (NOT AT [facility]) - Abnormal; Notable for the following:    Color, Urine STRAW (*)    All other components within normal limits  TROPONIN I  D-DIMER, QUANTITATIVE (NOT AT [facility])    Imaging Review US Ob Comp Less 14 Wks  11/10/2014   CLINICAL DATA:  Pregnant, follow-up subchronic hemorrhage  EXAM: OBSTETRIC <14 WK ULTRASOUND  TECHNIQUE: Transabdominal ultrasound was performed for evaluation of the gestation as well as the maternal uterus and adnexal regions.  COMPARISON:  10/16/2004  FINDINGS: Intrauterine gestational sac: Visualized/normal in shape.  Yolk sac:  Present  Embryo:  Present  Cardiac Activity: Present  Heart Rate: 165 bpm  CRL:   31.8  mm   10 w 0 d                  [place] EDC: 06/08/2015  Maternal uterus/adnexae: Small residual subchronic hemorrhage.  Bilateral ovaries are within normal limits.  No free fluid.  Patient declined transvaginal ultrasound.  IMPRESSION: Single live intrauterine gestation with estimated gestational age [redacted] weeks 0 days by crown-rump length.  Small residual subchronic hemorrhage, improved.   Electronically Signed   By: Julian Hy M.D.   On: 11/10/2014 16:49     EKG Interpretation   Date/Time:  Monday November 10 2014  14:18:59 EDT Ventricular Rate:  85 PR Interval:  156 QRS Duration: 74 QT Interval:  350 QTC Calculation: 416 R Axis:   55 Text Interpretation:  Normal sinus rhythm Normal ECG Confirmed by Zira Helinski   MD, Cashe Gatt ([id number]) on 11/10/2014 7:56:40 PM      MDM   Final diagnoses:  Pregnancy    Patient is in no acute distress. No acute abdomen. Ultrasound reveals a single intrauterine gestation approximately 10 weeks. Chest pain workup negative. Discharge medications Phenergan 25 mg. Patient will get OB GYN follow-up.    Nat Christen, MD 11/10/14 2035

## 2014-11-10 NOTE — ED Notes (Signed)
Pt continues to sleep soundly.  Call light in reach.

## 2014-11-10 NOTE — ED Notes (Signed)
Assumed care of patient from [place], [place]. Pt awaiting EDP dispo at this time.

## 2014-11-11 ENCOUNTER — Encounter: Payer: Self-pay | Admitting: Adult Health

## 2014-11-12 ENCOUNTER — Encounter: Payer: Self-pay | Admitting: Adult Health

## 2014-11-12 ENCOUNTER — Ambulatory Visit (INDEPENDENT_AMBULATORY_CARE_PROVIDER_SITE_OTHER): Payer: 59 | Admitting: Adult Health

## 2014-11-12 VITALS — BP 120/70 | HR 92 | Ht 66.0 in | Wt 209.0 lb

## 2014-11-12 DIAGNOSIS — N949 Unspecified condition associated with female genital organs and menstrual cycle: Secondary | ICD-10-CM | POA: Insufficient documentation

## 2014-11-12 DIAGNOSIS — Z3490 Encounter for supervision of normal pregnancy, unspecified, unspecified trimester: Secondary | ICD-10-CM

## 2014-11-12 DIAGNOSIS — O09521 Supervision of elderly multigravida, first trimester: Secondary | ICD-10-CM

## 2014-11-12 DIAGNOSIS — O09529 Supervision of elderly multigravida, unspecified trimester: Secondary | ICD-10-CM

## 2014-11-12 DIAGNOSIS — Z3201 Encounter for pregnancy test, result positive: Secondary | ICD-10-CM | POA: Diagnosis not present

## 2014-11-12 DIAGNOSIS — R0602 Shortness of breath: Secondary | ICD-10-CM

## 2014-11-12 HISTORY — DX: Unspecified condition associated with female genital organs and menstrual cycle: N94.9

## 2014-11-12 HISTORY — DX: Supervision of elderly multigravida, unspecified trimester: O09.529

## 2014-11-12 HISTORY — DX: Encounter for supervision of normal pregnancy, unspecified, unspecified trimester: Z34.90

## 2014-11-12 HISTORY — DX: Shortness of breath: R06.02

## 2014-11-12 LAB — POCT URINE PREGNANCY: Preg Test, Ur: POSITIVE — AB

## 2014-11-12 MED ORDER — PRENATAL PLUS 27-1 MG PO TABS: 1.0000 | ORAL_TABLET | Freq: Every day | ORAL | Status: DC

## 2014-11-12 NOTE — Patient Instructions (Signed)
First Trimester of Pregnancy The first trimester of pregnancy is from week 1 until the end of week 12 (months 1 through 3). A week after a sperm fertilizes an egg, the egg will implant on the wall of the uterus. This embryo will begin to develop into a baby. Genes from you and your partner are forming the baby. The female genes determine whether the baby is a boy or a girl. At 6-8 weeks, the eyes and face are formed, and the heartbeat can be seen on ultrasound. At the end of 12 weeks, all the baby's organs are formed.  Now that you are pregnant, you will want to do everything you can to have a healthy baby. Two of the most important things are to get good prenatal care and to follow your health care provider's instructions. Prenatal care is all the medical care you receive before the baby's birth. This care will help prevent, find, and treat any problems during the pregnancy and childbirth. BODY CHANGES Your body goes through many changes during pregnancy. The changes vary from woman to woman.   You may gain or lose a couple of pounds at first.  You may feel sick to your stomach (nauseous) and throw up (vomit). If the vomiting is uncontrollable, call your health care provider.  You may tire easily.  You may develop headaches that can be relieved by medicines approved by your health care provider.  You may urinate more often. Painful urination may mean you have a bladder infection.  You may develop heartburn as a result of your pregnancy.  You may develop constipation because certain hormones are causing the muscles that push waste through your intestines to slow down.  You may develop hemorrhoids or swollen, bulging veins (varicose veins).  Your breasts may begin to grow larger and become tender. Your nipples may stick out more, and the tissue that surrounds them (areola) may become darker.  Your gums may bleed and may be sensitive to brushing and flossing.  Dark spots or blotches (chloasma,  mask of pregnancy) may develop on your face. This will likely fade after the baby is born.  Your menstrual periods will stop.  You may have a loss of appetite.  You may develop cravings for certain kinds of food.  You may have changes in your emotions from day to day, such as being excited to be pregnant or being concerned that something may go wrong with the pregnancy and baby.  You may have more vivid and strange dreams.  You may have changes in your hair. These can include thickening of your hair, rapid growth, and changes in texture. Some women also have hair loss during or after pregnancy, or hair that feels dry or thin. Your hair will most likely return to normal after your baby is born. WHAT TO EXPECT AT YOUR PRENATAL VISITS During a routine prenatal visit:  You will be weighed to make sure you and the baby are growing normally.  Your blood pressure will be taken.  Your abdomen will be measured to track your baby's growth.  The fetal heartbeat will be listened to starting around week 10 or 12 of your pregnancy.  Test results from any previous visits will be discussed. Your health care provider may ask you:  How you are feeling.  If you are feeling the baby move.  If you have had any abnormal symptoms, such as leaking fluid, bleeding, severe headaches, or abdominal cramping.  If you have any questions. Other tests   that may be performed during your first trimester include:  Blood tests to find your blood type and to check for the presence of any previous infections. They will also be used to check for low iron levels (anemia) and Rh antibodies. Later in the pregnancy, blood tests for diabetes will be done along with other tests if problems develop.  Urine tests to check for infections, diabetes, or protein in the urine.  An ultrasound to confirm the proper growth and development of the baby.  An amniocentesis to check for possible genetic problems.  Fetal screens for  spina bifida and Down syndrome.  You may need other tests to make sure you and the baby are doing well. HOME CARE INSTRUCTIONS  Medicines  Follow your health care provider's instructions regarding medicine use. Specific medicines may be either safe or unsafe to take during pregnancy.  Take your prenatal vitamins as directed.  If you develop constipation, try taking a stool softener if your health care provider approves. Diet  Eat regular, well-balanced meals. Choose a variety of foods, such as meat or vegetable-based protein, fish, milk and low-fat dairy products, vegetables, fruits, and whole grain breads and cereals. Your health care provider will help you determine the amount of weight gain that is right for you.  Avoid raw meat and uncooked cheese. These carry germs that can cause birth defects in the baby.  Eating four or five small meals rather than three large meals a day may help relieve nausea and vomiting. If you start to feel nauseous, eating a few soda crackers can be helpful. Drinking liquids between meals instead of during meals also seems to help nausea and vomiting.  If you develop constipation, eat more high-fiber foods, such as fresh vegetables or fruit and whole grains. Drink enough fluids to keep your urine clear or pale yellow. Activity and Exercise  Exercise only as directed by your health care provider. Exercising will help you:  Control your weight.  Stay in shape.  Be prepared for labor and delivery.  Experiencing pain or cramping in the lower abdomen or low back is a good sign that you should stop exercising. Check with your health care provider before continuing normal exercises.  Try to avoid standing for long periods of time. Move your legs often if you must stand in one place for a long time.  Avoid heavy lifting.  Wear low-heeled shoes, and practice good posture.  You may continue to have sex unless your health care provider directs you  otherwise. Relief of Pain or Discomfort  Wear a good support bra for breast tenderness.   Take warm sitz baths to soothe any pain or discomfort caused by hemorrhoids. Use hemorrhoid cream if your health care provider approves.   Rest with your legs elevated if you have leg cramps or low back pain.  If you develop varicose veins in your legs, wear support hose. Elevate your feet for 15 minutes, 3-4 times a day. Limit salt in your diet. Prenatal Care  Schedule your prenatal visits by the twelfth week of pregnancy. They are usually scheduled monthly at first, then more often in the last 2 months before delivery.  Write down your questions. Take them to your prenatal visits.  Keep all your prenatal visits as directed by your health care provider. Safety  Wear your seat belt at all times when driving.  Make a list of emergency phone numbers, including numbers for family, friends, the hospital, and police and fire departments. General Tips    Ask your health care provider for a referral to a local prenatal education class. Begin classes no later than at the beginning of month 6 of your pregnancy.  Ask for help if you have counseling or nutritional needs during pregnancy. Your health care provider can offer advice or refer you to specialists for help with various needs.  Do not use hot tubs, steam rooms, or saunas.  Do not douche or use tampons or scented sanitary pads.  Do not cross your legs for long periods of time.  Avoid cat litter boxes and soil used by cats. These carry germs that can cause birth defects in the baby and possibly loss of the fetus by miscarriage or stillbirth.  Avoid all smoking, herbs, alcohol, and medicines not prescribed by your health care provider. Chemicals in these affect the formation and growth of the baby.  Schedule a dentist appointment. At home, brush your teeth with a soft toothbrush and be gentle when you floss. SEEK MEDICAL CARE IF:   You have  dizziness.  You have mild pelvic cramps, pelvic pressure, or nagging pain in the abdominal area.  You have persistent nausea, vomiting, or diarrhea.  You have a bad smelling vaginal discharge.  You have pain with urination.  You notice increased swelling in your face, hands, legs, or ankles. SEEK IMMEDIATE MEDICAL CARE IF:   You have a fever.  You are leaking fluid from your vagina.  You have spotting or bleeding from your vagina.  You have severe abdominal cramping or pain.  You have rapid weight gain or loss.  You vomit blood or material that looks like coffee grounds.  You are exposed to [place] measles and have never had them.  You are exposed to fifth disease or chickenpox.  You develop a severe headache.  You have shortness of breath.  You have any kind of trauma, such as from a fall or a car accident. Document Released: 05/10/2001 Document Revised: 09/30/2013 Document Reviewed: 03/26/2013 [facility] Patient Information 2015 [place], [place]. This information is not intended to replace advice given to you by your health care provider. Make sure you discuss any questions you have with your health care provider. Return in 1 week for new OB visit

## 2014-11-12 NOTE — Progress Notes (Signed)
Subjective:     Patient ID: Joyce [name], female   DOB: [birth year], 39 y.o.   MRN: [id number]  HPI Rolla is a 39 year old black female in for UPT.She has been to ER for chest pain and shortness of breath on 6/13 has [place] that shows 10 week fetus with EDD 06/08/15 and small [facility] improving, was found 5/20 when had nausea and abdominal pain.Today complains of pain in both sides and short of breath on exertion.  Review of Systems  Patient denies any headaches, hearing loss, fatigue, blurred vision,problems with bowel movements, urination, or intercourse. No joint pain or mood swings.See HPI for positives.  Reviewed past medical,surgical, social and family history. Reviewed medications and allergies.     Objective:   Physical Exam BP 120/70 mmHg  Pulse 92  Ht 5\' 6"  (1.676 m)  Wt 209 lb (94.802 kg)  BMI 33.75 kg/m2  LMP 04/04/2016UPT + about 10+2 weeks with EDD 06/08/15, FHR 175 by doppler, Skin warm and dry. Neck: mid line trachea, normal thyroid, good ROM, no lymphadenopathy noted. Lungs: clear to ausculation bilaterally. Cardiovascular: regular rate and rhythm.Abdomen soft, non tender    Assessment:     Pregnant AMA Round ligament pain Short of breath of exertion     Plan:     Rx prenatal plus #30 take 1 daily with 11 refills Return in 1 week for new OB Review handout on first trimester Increase fluids Stay in [facility] Note given for today, FMLA papers to [employer] [employer]

## 2014-11-26 ENCOUNTER — Encounter: Payer: Self-pay | Admitting: Women's Health

## 2014-11-26 ENCOUNTER — Ambulatory Visit (INDEPENDENT_AMBULATORY_CARE_PROVIDER_SITE_OTHER): Payer: 59 | Admitting: Women's Health

## 2014-11-26 ENCOUNTER — Telehealth: Payer: Self-pay | Admitting: *Deleted

## 2014-11-26 ENCOUNTER — Ambulatory Visit (INDEPENDENT_AMBULATORY_CARE_PROVIDER_SITE_OTHER): Payer: 59

## 2014-11-26 ENCOUNTER — Other Ambulatory Visit (HOSPITAL_COMMUNITY)
Admission: RE | Admit: 2014-11-26 | Discharge: 2014-11-26 | Disposition: A | Discharge status: Home / Self Care | Payer: 59 | Source: Ambulatory Visit | Attending: Obstetrics & Gynecology | Admitting: Obstetrics & Gynecology

## 2014-11-26 VITALS — BP 114/64 | HR 82 | Wt 206.0 lb

## 2014-11-26 DIAGNOSIS — Z3682 Encounter for antenatal screening for nuchal translucency: Secondary | ICD-10-CM

## 2014-11-26 DIAGNOSIS — Z01419 Encounter for gynecological examination (general) (routine) without abnormal findings: Secondary | ICD-10-CM | POA: Insufficient documentation

## 2014-11-26 DIAGNOSIS — Z124 Encounter for screening for malignant neoplasm of cervix: Secondary | ICD-10-CM

## 2014-11-26 DIAGNOSIS — Z36 Encounter for antenatal screening of mother: Secondary | ICD-10-CM | POA: Diagnosis not present

## 2014-11-26 DIAGNOSIS — Z641 Problems related to multiparity: Secondary | ICD-10-CM

## 2014-11-26 DIAGNOSIS — Z3491 Encounter for supervision of normal pregnancy, unspecified, first trimester: Secondary | ICD-10-CM | POA: Diagnosis not present

## 2014-11-26 DIAGNOSIS — R87810 Cervical high risk human papillomavirus (HPV) DNA test positive: Secondary | ICD-10-CM | POA: Diagnosis present

## 2014-11-26 DIAGNOSIS — Z1389 Encounter for screening for other disorder: Secondary | ICD-10-CM

## 2014-11-26 DIAGNOSIS — Z331 Pregnant state, incidental: Secondary | ICD-10-CM

## 2014-11-26 DIAGNOSIS — Z1151 Encounter for screening for human papillomavirus (HPV): Secondary | ICD-10-CM | POA: Diagnosis present

## 2014-11-26 DIAGNOSIS — A599 Trichomoniasis, unspecified: Secondary | ICD-10-CM | POA: Insufficient documentation

## 2014-11-26 DIAGNOSIS — Z369 Encounter for antenatal screening, unspecified: Secondary | ICD-10-CM

## 2014-11-26 DIAGNOSIS — O09521 Supervision of elderly multigravida, first trimester: Secondary | ICD-10-CM

## 2014-11-26 DIAGNOSIS — Z0283 Encounter for blood-alcohol and blood-drug test: Secondary | ICD-10-CM

## 2014-11-26 HISTORY — DX: Problems related to multiparity: Z64.1

## 2014-11-26 LAB — POCT URINALYSIS DIPSTICK
Blood, UA: NEGATIVE
Glucose, UA: NEGATIVE
Ketones, UA: NEGATIVE
Nitrite, UA: NEGATIVE

## 2014-11-26 NOTE — Progress Notes (Signed)
[place] 12+2wks single IUP pos fht 147bpm,normal ov's bilat,crl 62.35mm,NB present,NT 1.78mm

## 2014-11-26 NOTE — Progress Notes (Signed)
Subjective:  Joyce [name] is a 39 y.o. [id number] African American female at [redacted]w[redacted]d by LMP c/w 10wk u/s, being seen today for her first obstetrical visit.  Her obstetrical history is significant for grand multiparity, term uncomplicated SVB x 6 in [place], 2 EABs, 1 SAB, AMA >35yo.  Pregnancy history fully reviewed. Recently treated for Trich in ED.    Patient reports cramping, some depression over this unplanned pregnancy- was not using contraception- planned EAB but changed mind after hearing heart beat in ED- plans for pp BTL. Denies vb, cramping, uti s/s, abnormal/malodorous vag d/c, or vulvovaginal itching/irritation.  BP 114/64 mmHg  Pulse 82  Wt 206 lb (93.441 kg)  LMP 09/01/2014  HISTORY: OB History  Gravida Para Term Preterm AB SAB TAB Ectopic Multiple Living  10 6 6  0 3 1    6     # Outcome Date GA Lbr Len/2nd Weight Sex Delivery Anes PTL Lv  10 Current           9 Term 05/25/06 [redacted]w[redacted]d  6 lb 2 oz (2.778 kg) M Vag-Spont EPI N Y  8 Term 01/24/99 [redacted]w[redacted]d  6 lb 6 oz (2.892 kg) M   N Y  7 Term 08/05/97 [redacted]w[redacted]d  6 lb (2.722 kg) M Vag-Spont  N Y  6 Term 12/04/95 [redacted]w[redacted]d  6 lb 2 oz (2.778 kg) F Vag-Spont EPI N Y  5 Term 11/23/93 [redacted]w[redacted]d  6 lb 4 oz (2.835 kg) F Vag-Spont  N Y  4 Term 10/07/92 [redacted]w[redacted]d  6 lb 2 oz (2.778 kg) M Vag-Spont EPI N Y  3 AB           2 SAB           1 AB              Past Medical History  Diagnosis Date  . Migraine   . Pregnant 11/12/2014  . AMA (advanced maternal age) multigravida 35+ 11/12/2014  . Round ligament pain 11/12/2014  . Short of breath on exertion 11/12/2014   Past Surgical History  Procedure Laterality Date  . Cholecystectomy     Family History  Problem Relation Age of Onset  . Diabetes Mother   . Hypertension Mother   . Kidney disease Mother   . Alzheimer's disease Father   . Kidney disease Sister   . Asthma Son   . Diabetes Sister   . Asthma Son   . Heart murmur Son     Exam   System:     General: Well developed & nourished, no acute  distress   Skin: Warm & dry, normal coloration and turgor, no rashes   Neurologic: Alert & oriented, normal mood   Cardiovascular: Regular rate & rhythm   Respiratory: Effort & rate normal, LCTAB, acyanotic   Abdomen: Soft, non tender   Extremities: normal strength, tone   Pelvic Exam:    Perineum: Normal perineum   Vulva: Normal, no lesions   Vagina:  Normal mucosa, thick clumpy white d/c c/w yeast   Cervix: Normal, bulbous, appears closed   Uterus: Normal size/shape/contour for GA   Thin prep pap smear obtained w/ high risk HPV cotesting FHR: 151 via doppler   Assessment:   Pregnancy: [id number] Patient Active Problem List   Diagnosis Date Noted  . Supervision of normal pregnancy 11/12/2014    Priority: High  . [place] multiparity 11/26/2014  . AMA (advanced maternal age) multigravida 35+ 11/12/2014  . Round ligament pain 11/12/2014  . Short of  breath on exertion 11/12/2014    100w2d [id number] New OB visit [place] multiparity Cramping Recent trichomonas Vaginal candida  Plan:  Initial labs drawn Trich POC from pap today OTC Monistat 7 for vaginal yeast Continue prenatal vitamins Problem list reviewed and updated Reviewed n/v relief measures and warning s/s to report Reviewed recommended weight gain based on pre-gravid BMI Encouraged well-balanced diet Genetic Screening discussed Integrated Screen: requested Cystic fibrosis screening discussed requested Ultrasound discussed; fetal survey: requested Follow up in asap for 1st it/nt (no visit), then 4 weeks for visit and 2nd IT [place] completed  Tawnya Crook CNM, [facility] 11/26/2014 9:19 AM

## 2014-11-26 NOTE — Telephone Encounter (Signed)
-----   Message from Roma Schanz, [place] sent at 11/26/2014  9:33 AM EDT ----- Please call her and tell her she can use monistat 7 for yeast, can start now.

## 2014-11-26 NOTE — Patient Instructions (Signed)

## 2014-11-26 NOTE — Addendum Note (Signed)
Addended by: Doyne Keel on: 11/26/2014 10:40 AM   Modules accepted: Orders

## 2014-11-27 LAB — CYTOLOGY - PAP

## 2014-11-28 LAB — MATERNAL SCREEN, INTEGRATED #1
Crown Rump Length: 62.9 mm
Gest. Age on Collection Date: 12.6 weeks
Maternal Age at EDD: 38.5 years
NUCHAL TRANSLUCENCY (NT): 1.4 mm
Number of Fetuses: 1
PAPP-A Value: 754.5 ng/mL
WEIGHT: 206 [lb_av]

## 2014-11-28 LAB — URINE CULTURE

## 2014-12-03 LAB — MICROSCOPIC EXAMINATION: Casts: NONE SEEN /lpf

## 2014-12-03 LAB — VARICELLA ZOSTER ANTIBODY, IGG: VARICELLA: 674 {index} (ref >165)

## 2014-12-03 LAB — HEPATITIS B SURFACE ANTIGEN: HEP B S AG: NEGATIVE

## 2014-12-03 LAB — URINALYSIS, ROUTINE W REFLEX MICROSCOPIC
BILIRUBIN UA: NEGATIVE
Glucose, UA: NEGATIVE
KETONES UA: NEGATIVE
Nitrite, UA: NEGATIVE
RBC, UA: NEGATIVE
Specific Gravity, UA: 1.023 (ref 1.005–1.030)
Urobilinogen, Ur: 1 mg/dL (ref 0.2–1.0)
pH, UA: 6.5 (ref 5.0–7.5)

## 2014-12-03 LAB — PMP SCREEN PROFILE (10S), URINE
Amphetamine Screen, Ur: NEGATIVE ng/mL
Barbiturate Screen, Ur: NEGATIVE ng/mL
Benzodiazepine Screen, Urine: NEGATIVE ng/mL
CANNABINOIDS UR QL SCN: NEGATIVE ng/mL
COCAINE(METAB.) SCREEN, URINE: NEGATIVE ng/mL
CREATININE(CRT), U: 241 mg/dL (ref 20.0–300.0)
Methadone Scn, Ur: NEGATIVE ng/mL
Opiate Scrn, Ur: NEGATIVE ng/mL
Oxycodone+Oxymorphone Ur Ql Scn: NEGATIVE ng/mL
PCP Scrn, Ur: NEGATIVE ng/mL
Ph of Urine: 6.1 (ref 4.5–8.9)
Propoxyphene, Screen: NEGATIVE ng/mL

## 2014-12-03 LAB — CBC
Hematocrit: 34.8 % (ref 34.0–46.6)
Hemoglobin: 10.8 g/dL — ABNORMAL LOW (ref 11.1–15.9)
MCH: 20.8 pg — AB (ref 26.6–33.0)
MCHC: 31 g/dL — AB (ref 31.5–35.7)
MCV: 67 fL — AB (ref 79–97)
PLATELETS: 358 10*3/uL (ref 150–379)
RBC: 5.18 x10E6/uL (ref 3.77–5.28)
RDW: 22.1 % — ABNORMAL HIGH (ref 12.3–15.4)
WBC: 9.5 10*3/uL (ref 3.4–10.8)

## 2014-12-03 LAB — ABO/RH: RH TYPE: POSITIVE

## 2014-12-03 LAB — SICKLE CELL SCREEN: Sickle Cell Screen: NEGATIVE

## 2014-12-03 LAB — RPR: RPR: NONREACTIVE

## 2014-12-03 LAB — RUBELLA SCREEN: Rubella Antibodies, IGG: <0.9 index — ABNORMAL LOW (ref >0.99)

## 2014-12-03 LAB — HIV ANTIBODY (ROUTINE TESTING W REFLEX): HIV SCREEN 4TH GENERATION: NONREACTIVE

## 2014-12-03 LAB — CYSTIC FIBROSIS MUTATION 97: GENE DIS ANAL CARRIER INTERP BLD/T-IMP: NOT DETECTED

## 2014-12-03 LAB — ANTIBODY SCREEN: Antibody Screen: NEGATIVE

## 2014-12-08 ENCOUNTER — Encounter: Payer: Self-pay | Admitting: Women's Health

## 2014-12-08 ENCOUNTER — Telehealth: Payer: Self-pay | Admitting: Women's Health

## 2014-12-08 DIAGNOSIS — Z283 Underimmunization status: Secondary | ICD-10-CM | POA: Insufficient documentation

## 2014-12-08 DIAGNOSIS — O09899 Supervision of other high risk pregnancies, unspecified trimester: Secondary | ICD-10-CM | POA: Insufficient documentation

## 2014-12-08 DIAGNOSIS — R87810 Cervical high risk human papillomavirus (HPV) DNA test positive: Secondary | ICD-10-CM | POA: Insufficient documentation

## 2014-12-08 DIAGNOSIS — O9989 Other specified diseases and conditions complicating pregnancy, childbirth and the puerperium: Secondary | ICD-10-CM

## 2014-12-08 MED ORDER — FUSION PLUS PO CAPS: 1.0000 | ORAL_CAPSULE | ORAL | Status: DC

## 2014-12-08 NOTE — Telephone Encounter (Signed)
LM for pt to return call to notify her of labs.  Roma Schanz, CNM, WHNP-BC 12/08/2014 1:22 PM

## 2014-12-09 ENCOUNTER — Emergency Department (HOSPITAL_COMMUNITY)
Admission: EM | Admit: 2014-12-09 | Discharge: 2014-12-10 | Disposition: A | Discharge status: Home / Self Care | Payer: 59 | Attending: Emergency Medicine | Admitting: Emergency Medicine

## 2014-12-09 ENCOUNTER — Encounter (HOSPITAL_COMMUNITY): Payer: Self-pay | Admitting: Emergency Medicine

## 2014-12-09 DIAGNOSIS — A5901 Trichomonal vulvovaginitis: Secondary | ICD-10-CM | POA: Diagnosis not present

## 2014-12-09 DIAGNOSIS — Z3A13 13 weeks gestation of pregnancy: Secondary | ICD-10-CM | POA: Insufficient documentation

## 2014-12-09 DIAGNOSIS — O9989 Other specified diseases and conditions complicating pregnancy, childbirth and the puerperium: Secondary | ICD-10-CM | POA: Diagnosis present

## 2014-12-09 DIAGNOSIS — R102 Pelvic and perineal pain: Secondary | ICD-10-CM | POA: Diagnosis not present

## 2014-12-09 DIAGNOSIS — O98811 Other maternal infectious and parasitic diseases complicating pregnancy, first trimester: Secondary | ICD-10-CM | POA: Diagnosis not present

## 2014-12-09 DIAGNOSIS — Z8679 Personal history of other diseases of the circulatory system: Secondary | ICD-10-CM | POA: Diagnosis not present

## 2014-12-09 DIAGNOSIS — R51 Headache: Secondary | ICD-10-CM | POA: Insufficient documentation

## 2014-12-09 DIAGNOSIS — A599 Trichomoniasis, unspecified: Secondary | ICD-10-CM

## 2014-12-09 DIAGNOSIS — Z3490 Encounter for supervision of normal pregnancy, unspecified, unspecified trimester: Secondary | ICD-10-CM

## 2014-12-09 LAB — URINALYSIS, ROUTINE W REFLEX MICROSCOPIC
Bilirubin Urine: NEGATIVE
Glucose, UA: NEGATIVE mg/dL
HGB URINE DIPSTICK: NEGATIVE
KETONES UR: NEGATIVE mg/dL
LEUKOCYTES UA: NEGATIVE
Nitrite: NEGATIVE
PH: 6 (ref 5.0–8.0)
Protein, ur: NEGATIVE mg/dL
Specific Gravity, Urine: 1.025 (ref 1.005–1.030)
Urobilinogen, UA: 1 mg/dL (ref 0.0–1.0)

## 2014-12-09 LAB — WET PREP, GENITAL
Clue Cells Wet Prep HPF POC: NONE SEEN
Yeast Wet Prep HPF POC: NONE SEEN

## 2014-12-09 MED ORDER — METRONIDAZOLE 0.75 % VA GEL: 1.0000 | Freq: Two times a day (BID) | VAGINAL | Status: DC

## 2014-12-09 MED ORDER — HYDROCODONE-ACETAMINOPHEN 5-325 MG PO TABS
2.0000 | ORAL_TABLET | ORAL | Status: AC
  Administered 2014-12-09: 2 via ORAL
  Filled 2014-12-09: qty 2

## 2014-12-09 NOTE — Discharge Instructions (Signed)
You'll need to see your obstetrician this week. In the meantime, use Tylenol for pain. Urinalysis was normal. You have a infection called trichomonas. Prescription for antibiotic gel.

## 2014-12-09 NOTE — ED Provider Notes (Signed)
CSN: [id number]     Arrival date & time 12/09/14  2110 History  This chart was scribed for Nat Christen, MD by Helane Gunther, ED Scribe. This patient was seen in room APA03/APA03 and the patient's care was started at 10:05 PM.     Chief Complaint  Patient presents with  . Pelvic Pain  . Headache   The history is provided by the patient. No language interpreter was used.   HPI Comments: DANAMARIE MINAMI is a 39 y.o. female [redacted] weeks pregnant, who presents to the Emergency Department complaining of worsening, aching suprapubic pain onset a few weeks ago, getting worse. She has mentioned this to her OBGYN at [facility], where she was last seen on 06/28. She underwent a pelvic exam and [place] there. She has taken tylenol PM with mild relief. She is a G9/P6/Ab2. She denies any complications with her pregnancy; no vaginal bleeding, discharge, dysuria.  Past Medical History  Diagnosis Date  . Migraine   . Pregnant 11/12/2014  . AMA (advanced maternal age) multigravida 35+ 11/12/2014  . Round ligament pain 11/12/2014  . Short of breath on exertion 11/12/2014   Past Surgical History  Procedure Laterality Date  . Cholecystectomy     Family History  Problem Relation Age of Onset  . Diabetes Mother   . Hypertension Mother   . Kidney disease Mother   . Alzheimer's disease Father   . Kidney disease Sister   . Asthma Son   . Diabetes Sister   . Asthma Son   . Heart murmur Son    History  Substance Use Topics  . Smoking status: Never Smoker   . Smokeless tobacco: Never Used  . Alcohol Use: No   OB History    Gravida Para Term Preterm AB TAB SAB Ectopic Multiple Living   10 6 6  0 3  1   6      Review of Systems  Genitourinary: Positive for pelvic pain. Negative for dysuria, vaginal bleeding and vaginal discharge.  Neurological: Positive for headaches.  All other systems reviewed and are negative.   Allergies  Review of patient's allergies indicates no known allergies.  Home  Medications   Prior to Admission medications   Medication Sig Start Date End Date Taking? Authorizing Provider  acetaminophen (TYLENOL) 500 MG tablet Take 1 tablet (500 mg total) by mouth every 6 (six) hours as needed for moderate pain. 10/17/14  Yes Merryl Hacker, MD  Iron-FA-B Cmp-C-Biot-Probiotic (FUSION PLUS) CAPS Take 1 capsule by mouth See admin instructions. 1 time daily between meals 12/08/14  Yes Roma Schanz, CNM  prenatal vitamin w/FE, FA (PRENATAL 1 + 1) 27-1 MG TABS tablet Take 1 tablet by mouth daily at 12 noon. 11/12/14  Yes Estill Dooms, NP  metroNIDAZOLE (METROGEL VAGINAL) 0.75 % vaginal gel Place 1 Applicatorful vaginally 2 (two) times daily. 12/09/14   Nat Christen, MD  promethazine (PHENERGAN) 25 MG tablet Take 1 tablet (25 mg total) by mouth every 6 (six) hours as needed. Patient not taking: Reported on 11/12/2014 11/10/14   Nat Christen, MD   BP 127/71 mmHg  Pulse 96  Temp(Src) 98.2 F (36.8 C) (Oral)  Resp 20  Ht 5\' 6"  (1.676 m)  Wt 205 lb (92.987 kg)  BMI 33.10 kg/m2  SpO2 100%  LMP 09/01/2014 Physical Exam  Constitutional: She is oriented to person, place, and time. She appears well-developed and well-nourished.  HENT:  Head: Normocephalic and atraumatic.  Eyes: Conjunctivae and EOM are normal.  Pupils are equal, round, and reactive to light.  Neck: Normal range of motion. Neck supple.  Cardiovascular: Normal rate and regular rhythm.   Pulmonary/Chest: Effort normal and breath sounds normal.  Abdominal: Soft. Bowel sounds are normal. There is tenderness.  Tender in suprapubic area  Genitourinary:  External genitalia normal. White frothy peri cervical discharge.  No cervical motion tenderness.  Musculoskeletal: Normal range of motion.  Neurological: She is alert and oriented to person, place, and time.  Skin: Skin is warm and dry.  Psychiatric: She has a normal mood and affect. Her behavior is normal.  Nursing note and vitals reviewed.   ED Course   Procedures  DIAGNOSTIC STUDIES: Oxygen Saturation is 100% on RA, normal by my interpretation.    COORDINATION OF CARE: 10:10 PM - Discussed plans to perform a pelvic exam and order a urinalysis. Advised Pt to take only regular Tylenol, as Tylenol PM contains benadryl. Pt advised of plan for treatment and pt agrees.  Labs Review Labs Reviewed  WET PREP, GENITAL - Abnormal; Notable for the following:    Trich, Wet Prep FEW (*)    WBC, Wet Prep HPF POC FEW (*)    All other components within normal limits  URINALYSIS, ROUTINE W REFLEX MICROSCOPIC (NOT AT [facility])  GC/CHLAMYDIA PROBE AMP ([place]) NOT AT [facility]    Imaging Review No results found.   EKG Interpretation None      MDM   Final diagnoses:  Pregnancy  Trichimoniasis   Patient has had an ultrasound on 11/10/14 which showed a 10-0/[redacted] weeks gestation with a small subchorionic hemorrhage.  No vaginal bleeding today. Urinalysis is normal. Wet prep shows Trichomonas. Will Rx metronidazole gel. Follow-up with OB this week  I personally performed the services described in this documentation, which was scribed in my presence. The recorded information has been reviewed and is accurate.    Nat Christen, MD 12/09/14 [id number]

## 2014-12-09 NOTE — ED Notes (Addendum)
Pt. Reports headache starting yesterday and pelvic pain starting today. Pt. Denies N/V/D. Pt. Pregnant. Pt. Denies vaginal bleeding.

## 2014-12-10 ENCOUNTER — Telehealth: Payer: Self-pay | Admitting: Women's Health

## 2014-12-10 NOTE — Telephone Encounter (Signed)
LM for pt to return call. Need to notify of neg pap w/ +HRHPV- will need repeat pap 62yr, slightly anemic- make sure taking pnv and eating fe-rich foods. Will continue to try to contact.  Roma Schanz, CNM, WHNP-BC 12/10/2014 1:14 PM

## 2014-12-11 LAB — GC/CHLAMYDIA PROBE AMP (~~LOC~~) NOT AT ARMC
Chlamydia: NEGATIVE
Neisseria Gonorrhea: NEGATIVE

## 2014-12-15 ENCOUNTER — Telehealth: Payer: Self-pay | Admitting: Women's Health

## 2014-12-15 NOTE — Telephone Encounter (Signed)
LM for pt to return call, need to notify of lab results.  Roma Schanz, CNM, [facility] 12/15/2014 11:58 AM

## 2014-12-22 ENCOUNTER — Encounter: Payer: Self-pay | Admitting: Women's Health

## 2014-12-24 ENCOUNTER — Encounter: Payer: Self-pay | Admitting: Advanced Practice Midwife

## 2014-12-24 ENCOUNTER — Ambulatory Visit (INDEPENDENT_AMBULATORY_CARE_PROVIDER_SITE_OTHER): Payer: 59 | Admitting: Advanced Practice Midwife

## 2014-12-24 VITALS — BP 100/60 | HR 80 | Wt 204.0 lb

## 2014-12-24 DIAGNOSIS — A599 Trichomoniasis, unspecified: Secondary | ICD-10-CM

## 2014-12-24 DIAGNOSIS — Z3682 Encounter for antenatal screening for nuchal translucency: Secondary | ICD-10-CM

## 2014-12-24 DIAGNOSIS — Z331 Pregnant state, incidental: Secondary | ICD-10-CM

## 2014-12-24 DIAGNOSIS — Z3492 Encounter for supervision of normal pregnancy, unspecified, second trimester: Secondary | ICD-10-CM

## 2014-12-24 DIAGNOSIS — Z1389 Encounter for screening for other disorder: Secondary | ICD-10-CM

## 2014-12-24 DIAGNOSIS — Z363 Encounter for antenatal screening for malformations: Secondary | ICD-10-CM

## 2014-12-24 LAB — POCT URINALYSIS DIPSTICK
Glucose, UA: NEGATIVE
Ketones, UA: NEGATIVE
Leukocytes, UA: NEGATIVE
Nitrite, UA: NEGATIVE
RBC UA: NEGATIVE

## 2014-12-24 MED ORDER — METRONIDAZOLE 500 MG PO TABS: 2000.0000 mg | ORAL_TABLET | Freq: Once | ORAL | Status: DC

## 2014-12-24 NOTE — Progress Notes (Addendum)
[id number] [redacted]w[redacted]d Estimated Date of Delivery: 06/08/15  Blood pressure 100/60, pulse 80, weight 204 lb (92.534 kg), last menstrual period 09/01/2014.   BP weight and urine results all reviewed and noted.  Initial Hgb 10.8. Pt "hates taking Fe", so discussed Fe rich foods--will try Cream of Wheat.   Please refer to the obstetrical flow sheet for the fundal height and fetal heart rate documentation:  Patient  denies any bleeding and no rupture of membranes symptoms or regular contractions. She went to ED 7/12 with pelvic pain, was found to still have trich.  Was rx'd metrogel (not effective against trich).  Patient is without complaints. All questions were answered.  Plan:  Continued routine obstetrical care, rx flagyl 2gm PO for pt and partner.  2nd IT  Follow up in 3 weeks for OB appointment, anatomy scan

## 2014-12-26 LAB — MATERNAL SCREEN, INTEGRATED #2
AFP MARKER: 27.7 ng/mL
AFP MoM: 0.97
Crown Rump Length: 62.9 mm
DIA MoM: 0.6
DIA Value: 86 pg/mL
Estriol, Unconjugated: 0.76 ng/mL
Gest. Age on Collection Date: 12.6 weeks
Gestational Age: 16.6 weeks
Maternal Age at EDD: 38.5 years
NUCHAL TRANSLUCENCY (NT): 1.4 mm
NUMBER OF FETUSES: 1
Nuchal Translucency MoM: 1.02
PAPP-A MOM: 1.11
PAPP-A Value: 754.5 ng/mL
Test Results:: NEGATIVE
WEIGHT: 206 [lb_av]
Weight: 204 [lb_av]
hCG MoM: 0.94
hCG Value: 23.8 IU/mL
uE3 MoM: 0.86

## 2014-12-28 ENCOUNTER — Encounter (HOSPITAL_COMMUNITY): Payer: Self-pay | Admitting: Emergency Medicine

## 2014-12-28 ENCOUNTER — Emergency Department (HOSPITAL_COMMUNITY)
Admission: EM | Admit: 2014-12-28 | Discharge: 2014-12-28 | Disposition: A | Discharge status: Home / Self Care | Payer: 59 | Attending: Emergency Medicine | Admitting: Emergency Medicine

## 2014-12-28 DIAGNOSIS — G43009 Migraine without aura, not intractable, without status migrainosus: Secondary | ICD-10-CM | POA: Diagnosis not present

## 2014-12-28 DIAGNOSIS — Z79899 Other long term (current) drug therapy: Secondary | ICD-10-CM | POA: Diagnosis not present

## 2014-12-28 DIAGNOSIS — Z87448 Personal history of other diseases of urinary system: Secondary | ICD-10-CM | POA: Insufficient documentation

## 2014-12-28 LAB — CBC WITH DIFFERENTIAL/PLATELET
BASOS PCT: 0 % (ref 0–1)
Basophils Absolute: 0 10*3/uL (ref 0.0–0.1)
EOS PCT: 0 % (ref 0–5)
Eosinophils Absolute: 0 10*3/uL (ref 0.0–0.7)
HEMATOCRIT: 36.7 % (ref 36.0–46.0)
HEMOGLOBIN: 12 g/dL (ref 12.0–15.0)
Lymphocytes Relative: 14 % (ref 12–46)
Lymphs Abs: 1.6 10*3/uL (ref 0.7–4.0)
MCH: 23.6 pg — ABNORMAL LOW (ref 26.0–34.0)
MCHC: 32.7 g/dL (ref 30.0–36.0)
MCV: 72.2 fL — AB (ref 78.0–100.0)
MONO ABS: 0.5 10*3/uL (ref 0.1–1.0)
Monocytes Relative: 5 % (ref 3–12)
Neutro Abs: 9.8 10*3/uL — ABNORMAL HIGH (ref 1.7–7.7)
Neutrophils Relative %: 81 % — ABNORMAL HIGH (ref 43–77)
Platelets: 228 10*3/uL (ref 150–400)
RBC: 5.08 MIL/uL (ref 3.87–5.11)
RDW: 24.4 % — AB (ref 11.5–15.5)
Smear Review: ADEQUATE
WBC: 12 10*3/uL — AB (ref 4.0–10.5)

## 2014-12-28 LAB — COMPREHENSIVE METABOLIC PANEL
ALBUMIN: 3.2 g/dL — AB (ref 3.5–5.0)
ALT: 16 U/L (ref 14–54)
ANION GAP: 9 (ref 5–15)
AST: 16 U/L (ref 15–41)
Alkaline Phosphatase: 35 U/L — ABNORMAL LOW (ref 38–126)
BUN: 5 mg/dL — AB (ref 6–20)
CHLORIDE: 107 mmol/L (ref 101–111)
CO2: 22 mmol/L (ref 22–32)
CREATININE: 0.47 mg/dL (ref 0.44–1.00)
Calcium: 8.4 mg/dL — ABNORMAL LOW (ref 8.9–10.3)
GFR calc Af Amer: >60 mL/min (ref >60)
GFR calc non Af Amer: >60 mL/min (ref >60)
Glucose, Bld: 85 mg/dL (ref 65–99)
POTASSIUM: 3.9 mmol/L (ref 3.5–5.1)
Sodium: 138 mmol/L (ref 135–145)
TOTAL PROTEIN: 6.6 g/dL (ref 6.5–8.1)
Total Bilirubin: 0.4 mg/dL (ref 0.3–1.2)

## 2014-12-28 LAB — URINALYSIS, ROUTINE W REFLEX MICROSCOPIC
BILIRUBIN URINE: NEGATIVE
GLUCOSE, UA: NEGATIVE mg/dL
HGB URINE DIPSTICK: NEGATIVE
Ketones, ur: 15 mg/dL — AB
Nitrite: NEGATIVE
PROTEIN: NEGATIVE mg/dL
Specific Gravity, Urine: 1.015 (ref 1.005–1.030)
Urobilinogen, UA: 2 mg/dL — ABNORMAL HIGH (ref 0.0–1.0)
pH: 7 (ref 5.0–8.0)

## 2014-12-28 LAB — URINE MICROSCOPIC-ADD ON

## 2014-12-28 MED ORDER — DIPHENHYDRAMINE HCL 50 MG/ML IJ SOLN
25.0000 mg | Freq: Once | INTRAMUSCULAR | Status: AC
  Administered 2014-12-28: 25 mg via INTRAVENOUS
  Filled 2014-12-28: qty 1

## 2014-12-28 MED ORDER — NITROFURANTOIN MONOHYD MACRO 100 MG PO CAPS: 100.0000 mg | ORAL_CAPSULE | Freq: Two times a day (BID) | ORAL | Status: DC

## 2014-12-28 MED ORDER — SODIUM CHLORIDE 0.9 % IV BOLUS (SEPSIS)
1000.0000 mL | Freq: Once | INTRAVENOUS | Status: AC
  Administered 2014-12-28: 1000 mL via INTRAVENOUS

## 2014-12-28 MED ORDER — METOCLOPRAMIDE HCL 5 MG/ML IJ SOLN
10.0000 mg | Freq: Once | INTRAMUSCULAR | Status: AC
  Administered 2014-12-28: 10 mg via INTRAVENOUS
  Filled 2014-12-28: qty 2

## 2014-12-28 NOTE — ED Notes (Signed)
Pt reports migraine with nausea/ vomiting since Friday. Pt is 85mo pregnant and unable to take her migraine medications so she has only been taking tylenol.

## 2014-12-28 NOTE — ED Provider Notes (Signed)
CSN: [id number]     Arrival date & time 12/28/14  1532 History   First MD Initiated Contact with Patient 12/28/14 1543     Chief Complaint  Patient presents with  . Migraine     (Consider location/radiation/quality/duration/timing/severity/associated sxs/prior Treatment) Patient is a 39 y.o. female presenting with migraines. The history is provided by the patient.  Migraine This is a recurrent problem. The current episode started more than 2 days ago. The problem occurs constantly. The problem has been gradually worsening. Pertinent negatives include no abdominal pain. Nothing aggravates the symptoms. Nothing relieves the symptoms. She has tried nothing for the symptoms. The treatment provided no relief.    Past Medical History  Diagnosis Date  . Migraine   . Pregnant 11/12/2014  . AMA (advanced maternal age) multigravida 35+ 11/12/2014  . Round ligament pain 11/12/2014  . Short of breath on exertion 11/12/2014   Past Surgical History  Procedure Laterality Date  . Cholecystectomy     Family History  Problem Relation Age of Onset  . Diabetes Mother   . Hypertension Mother   . Kidney disease Mother   . Alzheimer's disease Father   . Kidney disease Sister   . Asthma Son   . Diabetes Sister   . Asthma Son   . Heart murmur Son    History  Substance Use Topics  . Smoking status: Never Smoker   . Smokeless tobacco: Never Used  . Alcohol Use: No   OB History    Gravida Para Term Preterm AB TAB SAB Ectopic Multiple Living   10 6 6  0 3  1   6      Review of Systems  Gastrointestinal: Negative for abdominal pain.  Genitourinary: Negative for dysuria, vaginal bleeding and vaginal discharge.  All other systems reviewed and are negative.     Allergies  Review of patient's allergies indicates no known allergies.  Home Medications   Prior to Admission medications   Medication Sig Start Date End Date Taking? Authorizing Provider  acetaminophen (TYLENOL) 500 MG tablet Take 1  tablet (500 mg total) by mouth every 6 (six) hours as needed for moderate pain. Patient taking differently: Take 1,000 mg by mouth every 6 (six) hours as needed for moderate pain.  10/17/14  Yes Merryl Hacker, MD  Iron-FA-B Cmp-C-Biot-Probiotic (FUSION PLUS) CAPS Take 1 capsule by mouth See admin instructions. 1 time daily between meals 12/08/14  Yes Roma Schanz, CNM  prenatal vitamin w/FE, FA (PRENATAL 1 + 1) 27-1 MG TABS tablet Take 1 tablet by mouth daily at 12 noon. 11/12/14  Yes Estill Dooms, NP  metroNIDAZOLE (FLAGYL) 500 MG tablet Take 4 tablets (2,000 mg total) by mouth once. 12/24/14   Christin Fudge, CNM   BP 99/54 mmHg  Pulse 78  Temp(Src) 98.1 F (36.7 C) (Oral)  Resp 20  Wt 205 lb (92.987 kg)  SpO2 100%  LMP 09/01/2014 Physical Exam  Constitutional: She is oriented to person, place, and time. She appears well-developed and well-nourished. No distress.  HENT:  Head: Normocephalic.  Eyes: Conjunctivae are normal.  Neck: Neck supple. No tracheal deviation present.  Cardiovascular: Normal rate and regular rhythm.   Pulmonary/Chest: Effort normal. No respiratory distress.  Abdominal: Soft. She exhibits no distension.  Neurological: She is alert and oriented to person, place, and time. She has normal strength. No cranial nerve deficit. Coordination normal. GCS eye subscore is 4. GCS verbal subscore is 5. GCS motor subscore is 6.  Skin: Skin is  warm and dry.  Psychiatric: She has a normal mood and affect.    ED Course  Procedures (including critical care time) Labs Review Labs Reviewed  URINALYSIS, ROUTINE W REFLEX MICROSCOPIC (NOT AT [facility]) - Abnormal; Notable for the following:    Ketones, ur 15 (*)    Urobilinogen, UA 2.0 (*)    Leukocytes, UA MODERATE (*)    All other components within normal limits  COMPREHENSIVE METABOLIC PANEL - Abnormal; Notable for the following:    BUN 5 (*)    Calcium 8.4 (*)    Albumin 3.2 (*)    Alkaline Phosphatase 35  (*)    All other components within normal limits  CBC WITH DIFFERENTIAL/PLATELET - Abnormal; Notable for the following:    WBC 12.0 (*)    MCV 72.2 (*)    MCH 23.6 (*)    RDW 24.4 (*)    Neutrophils Relative % 81 (*)    Neutro Abs 9.8 (*)    All other components within normal limits  URINE MICROSCOPIC-ADD ON - Abnormal; Notable for the following:    Squamous Epithelial / LPF MANY (*)    Bacteria, UA MANY (*)    All other components within normal limits  URINE CULTURE    Imaging Review No results found.   EKG Interpretation None      MDM   Final diagnoses:  Migraine without aura and without status migrainosus, not intractable   39 year old female who is 4 months pregnant presents with headache that started over the course of the last 2 days that is typical of her previous migraine history. She does not feel well on arrival and is anxious with photophobia. She does not have hyperreflexia but she does have a hypertensive blood pressure which resolved spontaneously. Screening labs for preeclampsia demonstrate no proteinuria, liver enzyme elevation, or thrombocytopenia.  The patient is improved following Reglan and Benadryl administration. She does have some pyuria on a contaminated urine. Will cover with Macrobid empirically. Plan to follow up with PCP as needed and return precautions discussed for worsening or new concerning symptoms.     Leo Grosser, MD 12/28/14 [phone]

## 2014-12-28 NOTE — Discharge Instructions (Signed)

## 2014-12-31 LAB — URINE CULTURE

## 2015-01-15 ENCOUNTER — Ambulatory Visit (INDEPENDENT_AMBULATORY_CARE_PROVIDER_SITE_OTHER): Payer: 59

## 2015-01-15 ENCOUNTER — Ambulatory Visit (INDEPENDENT_AMBULATORY_CARE_PROVIDER_SITE_OTHER): Payer: 59 | Admitting: Advanced Practice Midwife

## 2015-01-15 VITALS — BP 120/70 | HR 88 | Wt 204.0 lb

## 2015-01-15 DIAGNOSIS — Z3492 Encounter for supervision of normal pregnancy, unspecified, second trimester: Secondary | ICD-10-CM

## 2015-01-15 DIAGNOSIS — Z363 Encounter for antenatal screening for malformations: Secondary | ICD-10-CM

## 2015-01-15 DIAGNOSIS — Z1389 Encounter for screening for other disorder: Secondary | ICD-10-CM

## 2015-01-15 DIAGNOSIS — Z36 Encounter for antenatal screening of mother: Secondary | ICD-10-CM

## 2015-01-15 DIAGNOSIS — Z331 Pregnant state, incidental: Secondary | ICD-10-CM

## 2015-01-15 LAB — POCT URINALYSIS DIPSTICK
Blood, UA: NEGATIVE
GLUCOSE UA: NEGATIVE
Ketones, UA: NEGATIVE
LEUKOCYTES UA: NEGATIVE
NITRITE UA: NEGATIVE
Protein, UA: NEGATIVE

## 2015-01-15 NOTE — Progress Notes (Signed)
[id number] [redacted]w[redacted]d Estimated Date of Delivery: 06/08/15  Blood pressure 120/70, pulse 88, weight 204 lb (92.534 kg), last menstrual period 09/01/2014.   BP weight and urine results all reviewed and noted.  Please refer to the obstetrical flow sheet for the fundal height and fetal heart rate documentation:US 19+3wks, measurements c/w dates,efw 299g,cephalic,post pl gr 0, cx 4.8cm,normal ov's bilat,svp of fluid 4.9cm,anatomy complete,no obvious abn seen  Patient reports good fetal movement, denies any bleeding and no rupture of membranes symptoms or regular contractions. Patient is without complaints. All questions were answered.  Plan:  Continued routine obstetrical care, start protein shakes d/t poor appetite  Follow up in 4 weeks for OB appointment,

## 2015-01-15 NOTE — Progress Notes (Signed)
[place] 19+3wks, measurements c/w dates,efw 299g,cephalic,post pl gr 0, cx 4.8cm,normal ov's bilat,svp of fluid 4.9cm,anatomy complete,no obvious abn seen

## 2015-01-26 ENCOUNTER — Emergency Department (HOSPITAL_COMMUNITY)
Admission: EM | Admit: 2015-01-26 | Discharge: 2015-01-27 | Disposition: A | Discharge status: Home / Self Care | Payer: 59 | Attending: Emergency Medicine | Admitting: Emergency Medicine

## 2015-01-26 ENCOUNTER — Encounter (HOSPITAL_COMMUNITY): Payer: Self-pay | Admitting: *Deleted

## 2015-01-26 DIAGNOSIS — Z8679 Personal history of other diseases of the circulatory system: Secondary | ICD-10-CM | POA: Diagnosis not present

## 2015-01-26 DIAGNOSIS — O99712 Diseases of the skin and subcutaneous tissue complicating pregnancy, second trimester: Secondary | ICD-10-CM | POA: Diagnosis not present

## 2015-01-26 DIAGNOSIS — Z79899 Other long term (current) drug therapy: Secondary | ICD-10-CM | POA: Insufficient documentation

## 2015-01-26 DIAGNOSIS — L0291 Cutaneous abscess, unspecified: Secondary | ICD-10-CM

## 2015-01-26 DIAGNOSIS — L0390 Cellulitis, unspecified: Secondary | ICD-10-CM

## 2015-01-26 DIAGNOSIS — Z3A20 20 weeks gestation of pregnancy: Secondary | ICD-10-CM | POA: Insufficient documentation

## 2015-01-26 DIAGNOSIS — L03313 Cellulitis of chest wall: Secondary | ICD-10-CM | POA: Insufficient documentation

## 2015-01-26 DIAGNOSIS — L02213 Cutaneous abscess of chest wall: Secondary | ICD-10-CM | POA: Insufficient documentation

## 2015-01-26 MED ORDER — LIDOCAINE HCL (PF) 2 % IJ SOLN
10.0000 mL | Freq: Once | INTRAMUSCULAR | Status: AC
  Administered 2015-01-26: 10 mL via INTRADERMAL
  Filled 2015-01-26: qty 10

## 2015-01-26 NOTE — ED Provider Notes (Signed)
CSN: [id number]     Arrival date & time 01/26/15  2215 History   First MD Initiated Contact with Patient 01/26/15 2255     Chief Complaint  Patient presents with  . Abscess     (Consider location/radiation/quality/duration/timing/severity/associated sxs/prior Treatment) HPI  Joyce [name] is a 39 y.o.pregnant  female, G10P6, at approx [redacted] weeks gestation, who presents to the Emergency Department complaining of a "boil" to her right upper chest for 3 days. She states that it area was small and hard at onset, but has continued to become large in size and increasingly painful.  She also has redness to the area.  She states she has been squeezing on it, tried topical creams and several home remedies without relief.  She denies shortness of breath, fever, chills, and vomiting. She states she has been receiving routine prenatal care and denies any complications with the pregnancy to date   Past Medical History  Diagnosis Date  . Migraine   . Pregnant 11/12/2014  . AMA (advanced maternal age) multigravida 35+ 11/12/2014  . Round ligament pain 11/12/2014  . Short of breath on exertion 11/12/2014   Past Surgical History  Procedure Laterality Date  . Cholecystectomy     Family History  Problem Relation Age of Onset  . Diabetes Mother   . Hypertension Mother   . Kidney disease Mother   . Alzheimer's disease Father   . Kidney disease Sister   . Asthma Son   . Diabetes Sister   . Asthma Son   . Heart murmur Son    Social History  Substance Use Topics  . Smoking status: Never Smoker   . Smokeless tobacco: Never Used  . Alcohol Use: No   OB History    Gravida Para Term Preterm AB TAB SAB Ectopic Multiple Living   10 6 6  0 3  1   6      Review of Systems  Constitutional: Negative for fever and chills.  Gastrointestinal: Negative for nausea, vomiting and abdominal pain.  Genitourinary: Negative for dysuria.  Musculoskeletal: Negative for joint swelling and arthralgias.  Skin:  Positive for color change.       Abscess right chest  Neurological: Negative for weakness and numbness.  Hematological: Negative for adenopathy.  All other systems reviewed and are negative.     Allergies  Review of patient's allergies indicates no known allergies.  Home Medications   Prior to Admission medications   Medication Sig Start Date End Date Taking? Authorizing Provider  acetaminophen (TYLENOL) 500 MG tablet Take 1 tablet (500 mg total) by mouth every 6 (six) hours as needed for moderate pain. Patient taking differently: Take 1,000 mg by mouth every 6 (six) hours as needed for moderate pain.  10/17/14   Merryl Hacker, MD  Iron-FA-B Cmp-C-Biot-Probiotic (FUSION PLUS) CAPS Take 1 capsule by mouth See admin instructions. 1 time daily between meals 12/08/14   Roma Schanz, CNM  metroNIDAZOLE (FLAGYL) 500 MG tablet Take 4 tablets (2,000 mg total) by mouth once. Patient not taking: Reported on 01/15/2015 12/24/14   Christin Fudge, CNM  nitrofurantoin, macrocrystal-monohydrate, (MACROBID) 100 MG capsule Take 1 capsule (100 mg total) by mouth 2 (two) times daily. Patient not taking: Reported on 01/15/2015 12/28/14   Leo Grosser, MD  prenatal vitamin w/FE, FA (PRENATAL 1 + 1) 27-1 MG TABS tablet Take 1 tablet by mouth daily at 12 noon. 11/12/14   Estill Dooms, NP   BP 131/61 mmHg  Pulse 98  Temp(Src)  97.9 F (36.6 C) (Oral)  Resp 20  Ht 5\' 6"  (1.676 m)  Wt 204 lb (92.534 kg)  BMI 32.94 kg/m2  SpO2 99%  LMP 09/01/2014 Physical Exam  Constitutional: She is oriented to person, place, and time. She appears well-developed and well-nourished. No distress.  HENT:  Head: Normocephalic and atraumatic.  Neck: Normal range of motion. Neck supple.  Cardiovascular: Normal rate and regular rhythm.   No murmur heard. Pulmonary/Chest: Effort normal and breath sounds normal. No respiratory distress.  Musculoskeletal: Normal range of motion.  Neurological: She is alert  and oriented to person, place, and time. She exhibits normal muscle tone. Coordination normal.  Skin: Skin is warm and dry.  3 cm localized area of erythema and induration to the right upper chest.  No drainage or fluctuance.  No lymphangitis  Psychiatric: She has a normal mood and affect.  Nursing note and vitals reviewed.   ED Course  Procedures (including critical care time) Labs Review Labs Reviewed - No data to display    EKG Interpretation None       INCISION AND DRAINAGE Performed by: Hale Bogus. Consent: Verbal consent obtained. Risks and benefits: risks, benefits and alternatives were discussed Type: abscess  Body area: right upper chest  Anesthesia: local infiltration  Incision was made with a #11 scalpel.  Local anesthetic: lidocaine 2 % w/o epinephrine  Anesthetic total: 2 ml  Complexity: complex Blunt dissection to break up loculations  Drainage: purulent  Drainage amount: moderate  Packing material: 1/4 in iodoform gauze  Patient tolerance: Patient tolerated the procedure well with no immediate complications.     MDM   Final diagnoses:  Abscess and cellulitis    Pt is well appearing.  Abscess with mild surrounding erythema.  Pt is pregnant so I will treat with clindamycin,  She appears stable for d/c and agrees to warm compresses and return here for packing removal and recheck in 2 days.      Kem Parkinson, PA-C 01/28/15 1912  Merryl Hacker, MD 01/28/15 2255

## 2015-01-26 NOTE — ED Notes (Signed)
Pt c/o red, raised area to right chest x 3 days

## 2015-01-27 DIAGNOSIS — O99712 Diseases of the skin and subcutaneous tissue complicating pregnancy, second trimester: Secondary | ICD-10-CM | POA: Diagnosis not present

## 2015-01-27 MED ORDER — CLINDAMYCIN HCL 150 MG PO CAPS
300.0000 mg | ORAL_CAPSULE | Freq: Once | ORAL | Status: AC
  Administered 2015-01-27: 300 mg via ORAL
  Filled 2015-01-27: qty 2

## 2015-01-27 MED ORDER — CLINDAMYCIN HCL 300 MG PO CAPS: 300.0000 mg | ORAL_CAPSULE | Freq: Four times a day (QID) | ORAL | Status: DC

## 2015-01-27 NOTE — ED Notes (Signed)
Pt states understanding of care given and follow up instructions 

## 2015-01-27 NOTE — Discharge Instructions (Signed)
Abscess °An abscess (boil or furuncle) is an infected area on or under the skin. This area is filled with yellowish-white fluid (pus) and other material (debris). °HOME CARE  °· Only take medicines as told by your doctor. °· If you were given antibiotic medicine, take it as directed. Finish the medicine even if you start to feel better. °· If gauze is used, follow your doctor's directions for changing the gauze. °· To avoid spreading the infection: °¨ Keep your abscess covered with a bandage. °¨ Wash your hands well. °¨ Do not share personal care items, towels, or whirlpools with others. °¨ Avoid skin contact with others. °· Keep your skin and clothes clean around the abscess. °· Keep all doctor visits as told. °GET HELP RIGHT AWAY IF:  °· You have more pain, puffiness (swelling), or redness in the wound site. °· You have more fluid or blood coming from the wound site. °· You have muscle aches, chills, or you feel sick. °· You have a fever. °MAKE SURE YOU:  °· Understand these instructions. °· Will watch your condition. °· Will get help right away if you are not doing well or get worse. °Document Released: 11/02/2007 Document Revised: 11/15/2011 Document Reviewed: 07/29/2011 °ExitCare® Patient Information ©2015 [employer]. This information is not intended to replace advice given to you by your health care provider. Make sure you discuss any questions you have with your health care provider. ° °

## 2015-01-28 ENCOUNTER — Emergency Department (HOSPITAL_COMMUNITY)
Admission: EM | Admit: 2015-01-28 | Discharge: 2015-01-28 | Disposition: A | Discharge status: Home / Self Care | Payer: 59 | Attending: Emergency Medicine | Admitting: Emergency Medicine

## 2015-01-28 ENCOUNTER — Encounter (HOSPITAL_COMMUNITY): Payer: Self-pay | Admitting: *Deleted

## 2015-01-28 DIAGNOSIS — Z79899 Other long term (current) drug therapy: Secondary | ICD-10-CM | POA: Insufficient documentation

## 2015-01-28 DIAGNOSIS — Z5189 Encounter for other specified aftercare: Secondary | ICD-10-CM

## 2015-01-28 DIAGNOSIS — Z8679 Personal history of other diseases of the circulatory system: Secondary | ICD-10-CM | POA: Diagnosis not present

## 2015-01-28 DIAGNOSIS — Z8742 Personal history of other diseases of the female genital tract: Secondary | ICD-10-CM | POA: Insufficient documentation

## 2015-01-28 DIAGNOSIS — Z4801 Encounter for change or removal of surgical wound dressing: Secondary | ICD-10-CM | POA: Insufficient documentation

## 2015-01-28 NOTE — ED Provider Notes (Signed)
CSN: [id number]     Arrival date & time 01/28/15  1327 History   First MD Initiated Contact with Patient 01/28/15 1343     Chief Complaint  Patient presents with  . Wound Check     (Consider location/radiation/quality/duration/timing/severity/associated sxs/prior Treatment) HPI Comments: Patient is a 39 year old female who presents for follow-up of abscess incision and drainage. This was performed here 2 days ago for an abscess to her right upper chest. She is here today for a packing removal. She states that it remains sore, however seems to be improving somewhat.  Patient is a 39 y.o. female presenting with wound check. The history is provided by the patient.  Wound Check This is a new problem. The current episode started 2 days ago. The problem occurs constantly. The problem has been gradually improving. Nothing aggravates the symptoms. Nothing relieves the symptoms. She has tried nothing for the symptoms. The treatment provided no relief.    Past Medical History  Diagnosis Date  . Migraine   . Pregnant 11/12/2014  . AMA (advanced maternal age) multigravida 35+ 11/12/2014  . Round ligament pain 11/12/2014  . Short of breath on exertion 11/12/2014   Past Surgical History  Procedure Laterality Date  . Cholecystectomy     Family History  Problem Relation Age of Onset  . Diabetes Mother   . Hypertension Mother   . Kidney disease Mother   . Alzheimer's disease Father   . Kidney disease Sister   . Asthma Son   . Diabetes Sister   . Asthma Son   . Heart murmur Son    Social History  Substance Use Topics  . Smoking status: Never Smoker   . Smokeless tobacco: Never Used  . Alcohol Use: No   OB History    Gravida Para Term Preterm AB TAB SAB Ectopic Multiple Living   10 6 6  0 3  1   6      Review of Systems    Allergies  Review of patient's allergies indicates no known allergies.  Home Medications   Prior to Admission medications   Medication Sig Start Date End Date  Taking? Authorizing Provider  acetaminophen (TYLENOL) 500 MG tablet Take 1 tablet (500 mg total) by mouth every 6 (six) hours as needed for moderate pain. Patient taking differently: Take 1,000 mg by mouth every 6 (six) hours as needed for moderate pain.  10/17/14  Yes Merryl Hacker, MD  clindamycin (CLEOCIN) 300 MG capsule Take 1 capsule (300 mg total) by mouth 4 (four) times daily. For 7 days 01/27/15  Yes Tammy Triplett, PA-C  Iron-FA-B Cmp-C-Biot-Probiotic (FUSION PLUS) CAPS Take 1 capsule by mouth See admin instructions. 1 time daily between meals 12/08/14  Yes Roma Schanz, CNM  prenatal vitamin w/FE, FA (PRENATAL 1 + 1) 27-1 MG TABS tablet Take 1 tablet by mouth daily at 12 noon. 11/12/14  Yes Estill Dooms, NP  metroNIDAZOLE (FLAGYL) 500 MG tablet Take 4 tablets (2,000 mg total) by mouth once. Patient not taking: Reported on 01/15/2015 12/24/14   Christin Fudge, CNM  nitrofurantoin, macrocrystal-monohydrate, (MACROBID) 100 MG capsule Take 1 capsule (100 mg total) by mouth 2 (two) times daily. Patient not taking: Reported on 01/15/2015 12/28/14   Leo Grosser, MD   BP 119/65 mmHg  Pulse 80  Temp(Src) 98 F (36.7 C) (Oral)  Resp 20  Ht 5\' 6"  (1.676 m)  Wt 204 lb (92.534 kg)  BMI 32.94 kg/m2  SpO2 100%  LMP 09/01/2014 Physical Exam  Constitutional: She is oriented to person, place, and time. She appears well-developed and well-nourished.  Neurological: She is alert and oriented to person, place, and time.  Skin: Skin is warm and dry.  The right upper chest is noted to have a dressing in place. Dressing was removed and there is a small iodoform packing in place. This was removed and a small quantity of additional pus was expressed. There is mild surrounding erythema and some induration, however no definite fluctuance is palpable.  Nursing note and vitals reviewed.   ED Course  Procedures (including critical care time) Labs Review Labs Reviewed - No data to  display  Imaging Review No results found. I have personally reviewed and evaluated these images and lab results as part of my medical decision-making.   EKG Interpretation None      MDM   Final diagnoses:  None    Packing was removed and the wound will be dressed. She is to continue her antibiotics, perform frequent warm soaks to the area, and return as needed if worsening.    Veryl Speak, MD 01/28/15 1351

## 2015-01-28 NOTE — ED Notes (Signed)
Covered wound with sterile dressing. Patient tolerated well.

## 2015-01-28 NOTE — Discharge Instructions (Signed)
Continue your antibiotics as previously prescribed.  Apply warm complexes as frequently as possible for the next several days.  When not applying warm compresses, keep the wound covered and apply bacitracin.   Wound Check Your wound appears healthy today. Your wound will heal gradually over time. Eventually a scar will form that will fade with time. FACTORS THAT AFFECT SCAR FORMATION:  People differ in the severity in which they scar.  Scar severity varies according to location, size, and the traits you inherited from your parents (genetic predisposition).  Irritation to the wound from infection, rubbing, or chemical exposure will increase the amount of scar formation. HOME CARE INSTRUCTIONS   If you were given a dressing, you should change it at least once a day or as instructed by your caregiver. If the bandage sticks, soak it off with a solution of hydrogen peroxide.  If the bandage becomes wet, dirty, or develops a bad smell, change it as soon as possible.  Look for signs of infection.  Only take over-the-counter or prescription medicines for pain, discomfort, or fever as directed by your caregiver. SEEK IMMEDIATE MEDICAL CARE IF:   You have redness, swelling, or increasing pain in the wound.  You notice pus coming from the wound.  You have a fever.  You notice a bad smell coming from the wound or dressing. Document Released: 02/20/2004 Document Revised: 08/08/2011 Document Reviewed: 05/16/2005 [facility] Patient Information 2015 [place], [place]. This information is not intended to replace advice given to you by your health care provider. Make sure you discuss any questions you have with your health care provider.

## 2015-01-28 NOTE — ED Notes (Signed)
Pt here for wound check and to have packing removed from right upper chest area.

## 2015-02-07 ENCOUNTER — Emergency Department (HOSPITAL_COMMUNITY)
Admission: EM | Admit: 2015-02-07 | Discharge: 2015-02-07 | Disposition: A | Discharge status: Home / Self Care | Payer: 59 | Attending: Emergency Medicine | Admitting: Emergency Medicine

## 2015-02-07 ENCOUNTER — Encounter (HOSPITAL_COMMUNITY): Payer: Self-pay | Admitting: Emergency Medicine

## 2015-02-07 DIAGNOSIS — Z8679 Personal history of other diseases of the circulatory system: Secondary | ICD-10-CM | POA: Diagnosis not present

## 2015-02-07 DIAGNOSIS — O98912 Unspecified maternal infectious and parasitic disease complicating pregnancy, second trimester: Secondary | ICD-10-CM | POA: Diagnosis present

## 2015-02-07 DIAGNOSIS — Z8742 Personal history of other diseases of the female genital tract: Secondary | ICD-10-CM | POA: Diagnosis not present

## 2015-02-07 DIAGNOSIS — Z79899 Other long term (current) drug therapy: Secondary | ICD-10-CM | POA: Diagnosis not present

## 2015-02-07 DIAGNOSIS — A5900 Urogenital trichomoniasis, unspecified: Secondary | ICD-10-CM | POA: Insufficient documentation

## 2015-02-07 DIAGNOSIS — A599 Trichomoniasis, unspecified: Secondary | ICD-10-CM

## 2015-02-07 DIAGNOSIS — Z3A24 24 weeks gestation of pregnancy: Secondary | ICD-10-CM | POA: Diagnosis not present

## 2015-02-07 DIAGNOSIS — Z3492 Encounter for supervision of normal pregnancy, unspecified, second trimester: Secondary | ICD-10-CM

## 2015-02-07 LAB — WET PREP, GENITAL: YEAST WET PREP: NONE SEEN

## 2015-02-07 MED ORDER — METRONIDAZOLE 500 MG PO TABS: 500.0000 mg | ORAL_TABLET | Freq: Two times a day (BID) | ORAL | Status: DC

## 2015-02-07 NOTE — ED Notes (Signed)
Notified Carmell Austria, NS to call rapid OB nurse.

## 2015-02-07 NOTE — Discharge Instructions (Signed)
Bacterial Vaginosis Bacterial vaginosis is a vaginal infection that occurs when the normal balance of bacteria in the vagina is disrupted. It results from an overgrowth of certain bacteria. This is the most common vaginal infection in women of childbearing age. Treatment is important to prevent complications, especially in pregnant women, as it can cause a premature delivery. CAUSES  Bacterial vaginosis is caused by an increase in harmful bacteria that are normally present in smaller amounts in the vagina. Several different kinds of bacteria can cause bacterial vaginosis. However, the reason that the condition develops is not fully understood. RISK FACTORS Certain activities or behaviors can put you at an increased risk of developing bacterial vaginosis, including:  Having a new sex partner or multiple sex partners.  Douching.  Using an intrauterine device (IUD) for contraception. Women do not get bacterial vaginosis from toilet seats, bedding, swimming pools, or contact with objects around them. SIGNS AND SYMPTOMS  Some women with bacterial vaginosis have no signs or symptoms. Common symptoms include:  Grey vaginal discharge.  A fishlike odor with discharge, especially after sexual intercourse.  Itching or burning of the vagina and vulva.  Burning or pain with urination. DIAGNOSIS  Your health care provider will take a medical history and examine the vagina for signs of bacterial vaginosis. A sample of vaginal fluid may be taken. Your health care provider will look at this sample under a microscope to check for bacteria and abnormal cells. A vaginal pH test may also be done.  TREATMENT  Bacterial vaginosis may be treated with antibiotic medicines. These may be given in the form of a pill or a vaginal cream. A second round of antibiotics may be prescribed if the condition comes back after treatment.  HOME CARE INSTRUCTIONS   Only take over-the-counter or prescription medicines as  directed by your health care provider.  If antibiotic medicine was prescribed, take it as directed. Make sure you finish it even if you start to feel better.  Do not have sex until treatment is completed.  Tell all sexual partners that you have a vaginal infection. They should see their health care provider and be treated if they have problems, such as a mild rash or itching.  Practice safe sex by using condoms and only having one sex partner. SEEK MEDICAL CARE IF:   Your symptoms are not improving after 3 days of treatment.  You have increased discharge or pain.  You have a fever. MAKE SURE YOU:   Understand these instructions.  Will watch your condition.  Will get help right away if you are not doing well or get worse. FOR MORE INFORMATION  Centers for Disease Control and Prevention, Division of STD Prevention: www.cdc.gov/std American Sexual Health Association (ASHA): www.ashastd.org  Document Released: 05/16/2005 Document Revised: 03/06/2013 Document Reviewed: 12/26/2012 [employer] Patient Information 2015 [employer]. This information is not intended to replace advice given to you by your health care provider. Make sure you discuss any questions you have with your health care provider.  

## 2015-02-07 NOTE — ED Provider Notes (Signed)
CSN: [id number]     Arrival date & time 02/07/15  0037 History  This chart was scribed for Varney Biles, MD by Randa Evens, ED Scribe. This patient was seen in room A06C/A06C and the patient's care was started at 2:15 AM.    Chief Complaint  Patient presents with  . Abdominal Cramping    6 months pregnant   The history is provided by the patient. No language interpreter was used.   HPI Comments: Joyce [name] is a 39 y.o. female who presents to the Emergency Department complaining of abdominal pain onset 2 days prior. Pt reports associated vaginal discharge described as mucous. Pt denies any trauma to her abdomen. Pt states that she is 6 month pregnant. Pt denies sexual intercourse at this time. Denies dysuria, hematuria, or vaginal bleeding. Reports Hx of cholecystectomy.  Also has hx of trichomonas that was treated with 2 grams of flagyl. Pt denies any intercourse since that time. #[id number] Pt already seen by rapid OB team. No labor contraction and FHT are reassuring. They already performed the vaginal exam and sent the wet prep and GC and chlamydia.  Past Medical History  Diagnosis Date  . Migraine   . Pregnant 11/12/2014  . AMA (advanced maternal age) multigravida 35+ 11/12/2014  . Round ligament pain 11/12/2014  . Short of breath on exertion 11/12/2014   Past Surgical History  Procedure Laterality Date  . Cholecystectomy     Family History  Problem Relation Age of Onset  . Diabetes Mother   . Hypertension Mother   . Kidney disease Mother   . Alzheimer's disease Father   . Kidney disease Sister   . Asthma Son   . Diabetes Sister   . Asthma Son   . Heart murmur Son    Social History  Substance Use Topics  . Smoking status: Never Smoker   . Smokeless tobacco: Never Used  . Alcohol Use: No   OB History    Gravida Para Term Preterm AB TAB SAB Ectopic Multiple Living   10 6 6  0 3  1   6      Review of Systems  Constitutional: Negative for activity change.   Cardiovascular: Negative for chest pain.  Gastrointestinal: Positive for abdominal pain.  Genitourinary: Positive for vaginal discharge. Negative for dysuria, hematuria and vaginal bleeding.      Allergies  Review of patient's allergies indicates no known allergies.  Home Medications   Prior to Admission medications   Medication Sig Start Date End Date Taking? Authorizing Provider  Iron-FA-B Cmp-C-Biot-Probiotic (FUSION PLUS) CAPS Take 1 capsule by mouth See admin instructions. 1 time daily between meals 12/08/14  Yes Roma Schanz, CNM  prenatal vitamin w/FE, FA (PRENATAL 1 + 1) 27-1 MG TABS tablet Take 1 tablet by mouth daily at 12 noon. 11/12/14  Yes Estill Dooms, NP  acetaminophen (TYLENOL) 500 MG tablet Take 1 tablet (500 mg total) by mouth every 6 (six) hours as needed for moderate pain. Patient not taking: Reported on 02/07/2015 10/17/14   Merryl Hacker, MD  clindamycin (CLEOCIN) 300 MG capsule Take 1 capsule (300 mg total) by mouth 4 (four) times daily. For 7 days Patient not taking: Reported on 02/07/2015 01/27/15   Tammy Triplett, PA-C  metroNIDAZOLE (FLAGYL) 500 MG tablet Take 1 tablet (500 mg total) by mouth 2 (two) times daily. 02/07/15   Varney Biles, MD  nitrofurantoin, macrocrystal-monohydrate, (MACROBID) 100 MG capsule Take 1 capsule (100 mg total) by mouth 2 (two) times  daily. Patient not taking: Reported on 01/15/2015 12/28/14   Leo Grosser, MD   BP 117/70 mmHg  Pulse 72  Temp(Src) 98.3 F (36.8 C) (Oral)  Resp 18  Ht 5\' 5"  (1.651 m)  Wt 205 lb (92.987 kg)  BMI 34.11 kg/m2  SpO2 99%  LMP 09/01/2014   Physical Exam  Constitutional: She appears well-developed and well-nourished.  HENT:  Head: Normocephalic and atraumatic.  Eyes: Conjunctivae are normal. Right eye exhibits no discharge. Left eye exhibits no discharge.  Pulmonary/Chest: Effort normal. No respiratory distress.  Abdominal: There is tenderness. There is no rebound and no guarding.   Suprapubic and RLQ tenderness.   Neurological: She is alert. Coordination normal.  Skin: Skin is warm and dry. No rash noted. She is not diaphoretic. No erythema.  Psychiatric: She has a normal mood and affect.  Nursing note and vitals reviewed.   ED Course  Procedures (including critical care time) DIAGNOSTIC STUDIES: Oxygen Saturation is 100% on RA, normal by my interpretation.    COORDINATION OF CARE: 2:42 AM-Discussed treatment plan with pt at bedside and pt agreed to plan.     Labs Review Labs Reviewed  WET PREP, GENITAL - Abnormal; Notable for the following:    Trich, Wet Prep MANY (*)    Clue Cells Wet Prep HPF POC FEW (*)    WBC, Wet Prep HPF POC MODERATE (*)    All other components within normal limits  GC/CHLAMYDIA PROBE AMP ([place]) NOT AT [facility]    Imaging Review No results found.    EKG Interpretation None      MDM   Final diagnoses:  Trichomonas vaginalis infection    Medical screening examination/treatment/procedure(s) were performed by me as the supervising physician. Scribe service was utilized for documentation only.   Pt has hx of trichomonas.  Reports vaginal d/c x 2-3 days with some intermittent cramping abd pain.  OB team assessed and cleared her from OB side. Wet prep is + - will give her prescription for flagyl this time over one time 2 grams. She is to see her OB on 15th. She has no peritoneal abdominal tenderness.      Varney Biles, MD 02/07/15 [phone]

## 2015-02-07 NOTE — ED Notes (Signed)
C/o constant abd pressure since yesterday and "mucous" in underwear.  Pt states she is 6 months pregnant.

## 2015-02-07 NOTE — ED Notes (Signed)
OB Rapid [facility] Contacted, Warehouse manager @ [facility] says to go ahead and get Fetal Heart Tones. Lennette Bihari, RN Notified.

## 2015-02-07 NOTE — ED Notes (Signed)
[facility] OB in room.

## 2015-02-07 NOTE — ED Notes (Signed)
Pt verbalized understanding of d/c instructions and has no further questions.  

## 2015-02-09 LAB — GC/CHLAMYDIA PROBE AMP (~~LOC~~) NOT AT ARMC
Chlamydia: NEGATIVE
Neisseria Gonorrhea: NEGATIVE

## 2015-02-12 ENCOUNTER — Ambulatory Visit (INDEPENDENT_AMBULATORY_CARE_PROVIDER_SITE_OTHER): Payer: 59 | Admitting: Advanced Practice Midwife

## 2015-02-12 VITALS — BP 120/76 | HR 80 | Wt 208.0 lb

## 2015-02-12 DIAGNOSIS — Z331 Pregnant state, incidental: Secondary | ICD-10-CM

## 2015-02-12 DIAGNOSIS — A599 Trichomoniasis, unspecified: Secondary | ICD-10-CM

## 2015-02-12 DIAGNOSIS — Z3492 Encounter for supervision of normal pregnancy, unspecified, second trimester: Secondary | ICD-10-CM

## 2015-02-12 DIAGNOSIS — Z1389 Encounter for screening for other disorder: Secondary | ICD-10-CM

## 2015-02-12 LAB — POCT URINALYSIS DIPSTICK
Blood, UA: NEGATIVE
Glucose, UA: NEGATIVE
KETONES UA: NEGATIVE
Leukocytes, UA: NEGATIVE
Nitrite, UA: NEGATIVE
Protein, UA: NEGATIVE

## 2015-02-12 NOTE — Progress Notes (Signed)
Pt denies any problems or concerns at this time.  

## 2015-02-12 NOTE — Progress Notes (Signed)
[id number] [redacted]w[redacted]d Estimated Date of Delivery: 06/08/15  Blood pressure 120/76, pulse 80, weight 208 lb (94.348 kg), last menstrual period 09/01/2014.   BP weight and urine results all reviewed and noted.  Please refer to the obstetrical flow sheet for the fundal height and fetal heart rate documentation:  Patient reports good fetal movement, denies any bleeding and no rupture of membranes symptoms or regular contractions. Patient has sore throat and stuffy nose since yesterday.  Throat:  Not red, no coughing.  Went to ED 9/10 for mucous/spotitng. Dx with trich again. Has not had sex since 2 treatments ago.  All questions were answered.  Orders Placed This Encounter  Procedures  . POCT urinalysis dipstick    Plan:  Continued routine obstetrical care; Given  7 day treatment of flagyl from ED for + trich on 9/15, recheck wet prep next visit.  Return in about 3 weeks (around 03/05/2015) for PN2/LROB.  If sx are getting worse on Monday or just not getting better by Wednesday, let us know and wil rx antibiotics.

## 2015-02-12 NOTE — Patient Instructions (Signed)
1. Before your test, do not eat or drink anything for 8-10 hours prior to your  appointment (a small amount of water is allowed and you may take any medicines you normally take). Be sure to drink lots of water the day before. 2. When you arrive, your blood will be drawn for a 'fasting' blood sugar level.  Then you will be given a sweetened carbonated beverage to drink. You should  complete drinking this beverage within five minutes. After finishing the  beverage, you will have your blood drawn exactly 1 and 2 hours later. Having  your blood drawn on time is an important part of this test. A total of three blood  samples will be done. 3. The test takes approximately 2  hours. During the test, do not have anything to  eat or drink. Do not smoke, chew gum (not even sugarless gum) or use breath mints.  4. During the test you should remain close by and seated as much as possible and  avoid walking around. You may want to bring a book or something else to  occupy your time.  5. After your test, you may eat and drink as normal. You may want to bring a snack  to eat after the test is finished. Your provider will advise you as to the results of  this test and any follow-up if necessary  You will also be retested for syphilis, HIV and blood levels (anemia):  You were already tested in the first trimester, but [place] recommends retesting.  Additionally, you will be tested for Type 2 Herpes. MOST people do not know that they have genital herpes, as only around 15% of people have outbreaks.  However, it is still transmittable to other people, including the baby (but only during the birth).  If you test positive for Type 2 Herpes, we place you on a medicine called acyclovir the last 6 weeks of your pregnancy to prevent transmission of the virus to the baby during the birth.    If your sugar test is positive for gestational diabetes, you will be given an phone call and further instructions discussed.   We typically do not call patients with positive herpes results, but will discuss it at your next appointment.  If you wish to know all of your test results before your next appointment, feel free to call the office, or look up your test results on Mychart.  (The range that the lab uses for normal values of the sugar test are not necessarily the range that is used for pregnant women; if your results are within the range, they are definitely normal.  However, if a value is deemed "high" by the lab, it may not be too high for a pregnant woman.  We will need to discuss the normal range if your value(s) fall in the "high" category).     Tdap Vaccine  It is recommended that you get the Tdap vaccine during the third trimester of EACH pregnancy to help protect your baby from getting pertussis (whooping cough)  27-36 weeks is the BEST time to do this so that you can pass the protection on to your baby. During pregnancy is better than after pregnancy, but if you are unable to get it during pregnancy it will be offered at the hospital.  You can get this vaccine at the health department or your family doctor, as well as some pharmacies.  Everyone who will be around your baby should also be up-to-date on   their vaccines. Adults (who are not pregnant) only need 1 dose of Tdap during adulthood.      

## 2015-03-02 ENCOUNTER — Inpatient Hospital Stay (HOSPITAL_COMMUNITY): Payer: 59

## 2015-03-02 ENCOUNTER — Inpatient Hospital Stay (HOSPITAL_COMMUNITY)
Admission: AD | Admit: 2015-03-02 | Discharge: 2015-03-02 | Disposition: A | Discharge status: Home / Self Care | Payer: 59 | Source: Ambulatory Visit | Attending: Family Medicine | Admitting: Family Medicine

## 2015-03-02 ENCOUNTER — Encounter (HOSPITAL_COMMUNITY): Payer: Self-pay | Admitting: *Deleted

## 2015-03-02 DIAGNOSIS — R102 Pelvic and perineal pain: Secondary | ICD-10-CM | POA: Diagnosis not present

## 2015-03-02 DIAGNOSIS — Z3492 Encounter for supervision of normal pregnancy, unspecified, second trimester: Secondary | ICD-10-CM

## 2015-03-02 DIAGNOSIS — O4702 False labor before 37 completed weeks of gestation, second trimester: Secondary | ICD-10-CM | POA: Diagnosis not present

## 2015-03-02 DIAGNOSIS — R109 Unspecified abdominal pain: Secondary | ICD-10-CM | POA: Insufficient documentation

## 2015-03-02 DIAGNOSIS — O26892 Other specified pregnancy related conditions, second trimester: Secondary | ICD-10-CM | POA: Insufficient documentation

## 2015-03-02 DIAGNOSIS — N939 Abnormal uterine and vaginal bleeding, unspecified: Secondary | ICD-10-CM | POA: Diagnosis present

## 2015-03-02 DIAGNOSIS — Z3A26 26 weeks gestation of pregnancy: Secondary | ICD-10-CM | POA: Insufficient documentation

## 2015-03-02 DIAGNOSIS — O2342 Unspecified infection of urinary tract in pregnancy, second trimester: Secondary | ICD-10-CM | POA: Insufficient documentation

## 2015-03-02 DIAGNOSIS — O4700 False labor before 37 completed weeks of gestation, unspecified trimester: Secondary | ICD-10-CM | POA: Diagnosis not present

## 2015-03-02 DIAGNOSIS — O26899 Other specified pregnancy related conditions, unspecified trimester: Secondary | ICD-10-CM

## 2015-03-02 DIAGNOSIS — N390 Urinary tract infection, site not specified: Secondary | ICD-10-CM

## 2015-03-02 LAB — URINALYSIS, ROUTINE W REFLEX MICROSCOPIC
Bilirubin Urine: NEGATIVE
GLUCOSE, UA: NEGATIVE mg/dL
Hgb urine dipstick: NEGATIVE
Ketones, ur: 15 mg/dL — AB
NITRITE: NEGATIVE
PH: 6 (ref 5.0–8.0)
PROTEIN: NEGATIVE mg/dL
Specific Gravity, Urine: >1.03 — ABNORMAL HIGH (ref 1.005–1.030)
Urobilinogen, UA: 0.2 mg/dL (ref 0.0–1.0)

## 2015-03-02 LAB — CBC WITH DIFFERENTIAL/PLATELET
Basophils Absolute: 0 10*3/uL (ref 0.0–0.1)
Basophils Relative: 0 %
Eosinophils Absolute: 0 10*3/uL (ref 0.0–0.7)
Eosinophils Relative: 0 %
HEMATOCRIT: 36.9 % (ref 36.0–46.0)
Hemoglobin: 12.3 g/dL (ref 12.0–15.0)
LYMPHS PCT: 12 %
Lymphs Abs: 1.4 10*3/uL (ref 0.7–4.0)
MCH: 25.9 pg — AB (ref 26.0–34.0)
MCHC: 33.3 g/dL (ref 30.0–36.0)
MCV: 77.7 fL — AB (ref 78.0–100.0)
MONO ABS: 0.4 10*3/uL (ref 0.1–1.0)
MONOS PCT: 3 %
Neutro Abs: 10.5 10*3/uL — ABNORMAL HIGH (ref 1.7–7.7)
Neutrophils Relative %: 85 %
Platelets: 194 10*3/uL (ref 150–400)
RBC: 4.75 MIL/uL (ref 3.87–5.11)
RDW: 16.5 % — AB (ref 11.5–15.5)
WBC: 12.3 10*3/uL — ABNORMAL HIGH (ref 4.0–10.5)

## 2015-03-02 LAB — WET PREP, GENITAL
CLUE CELLS WET PREP: NONE SEEN
Trich, Wet Prep: NONE SEEN
Yeast Wet Prep HPF POC: NONE SEEN

## 2015-03-02 LAB — URINE MICROSCOPIC-ADD ON

## 2015-03-02 IMAGING — US US OB LIMITED
1 series · 15 of 20 positions shown · non-contrast
Comparison: none

[Series 1: us ob limited · 15 of 20 slices shown]
[im 1/20]
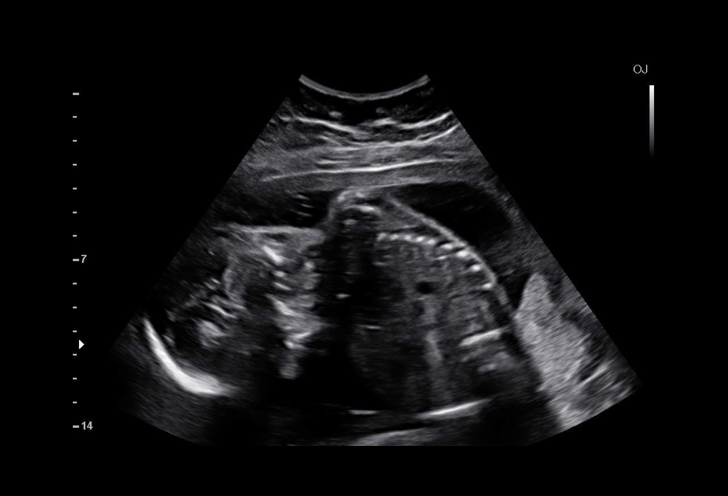
[im 3/20]
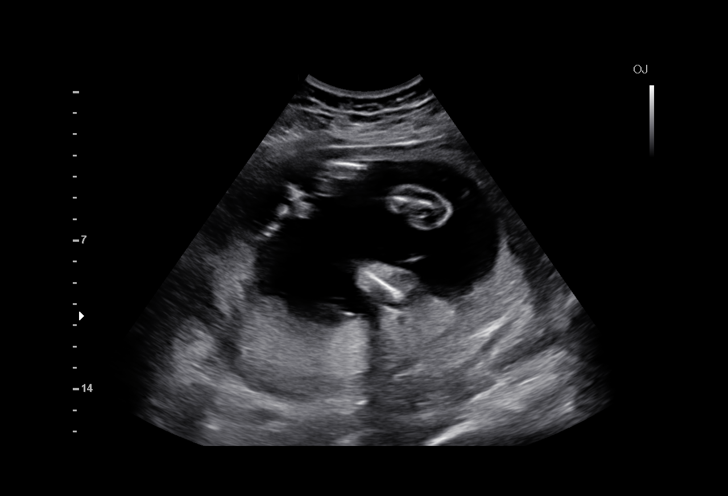
[im 4/20]
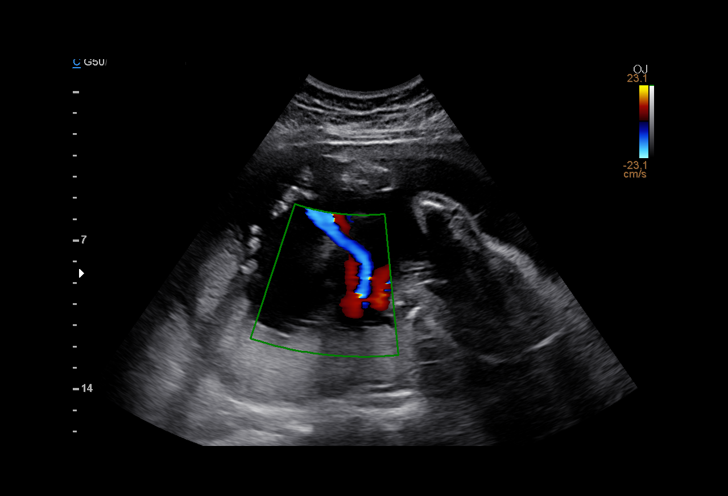
[im 5/20]
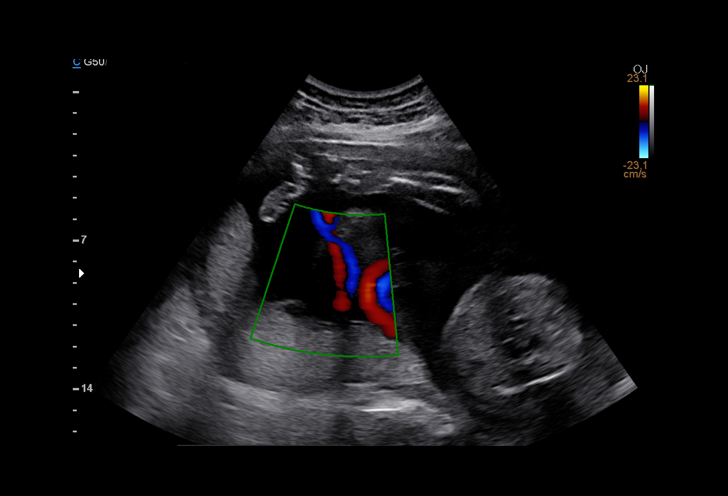
[im 7/20]
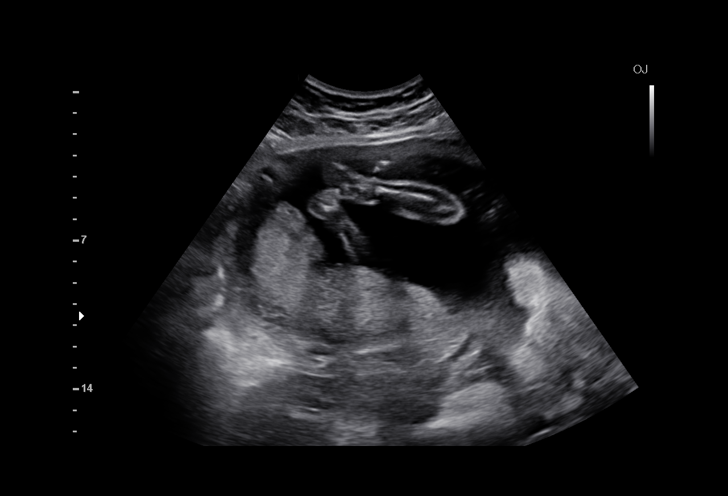
[im 8/20]
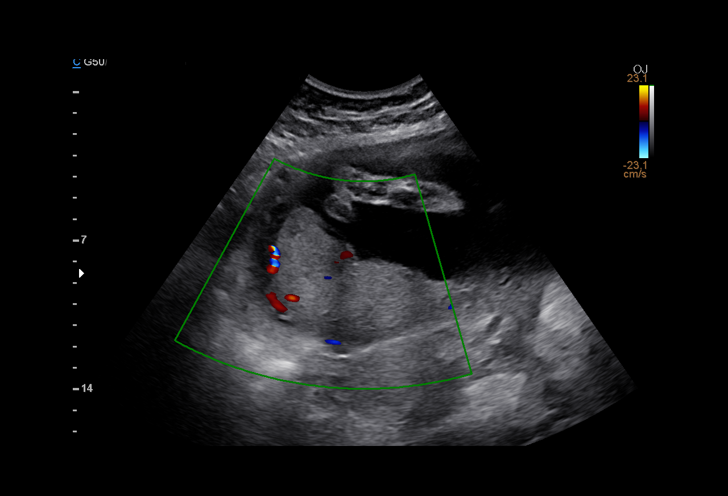
[im 9/20]
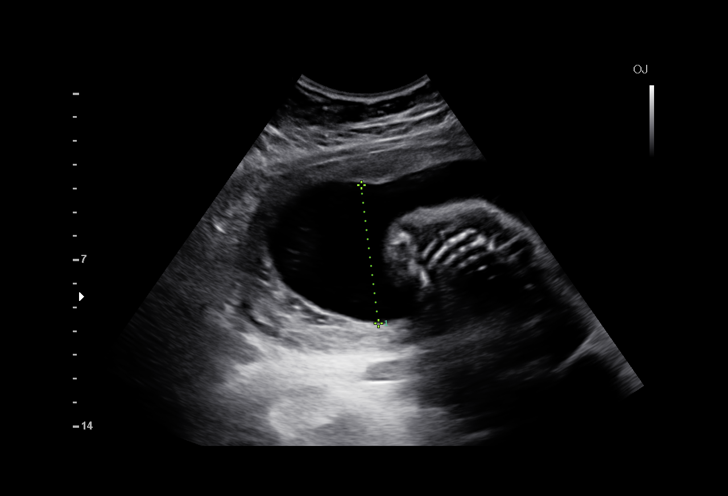
[im 11/20]
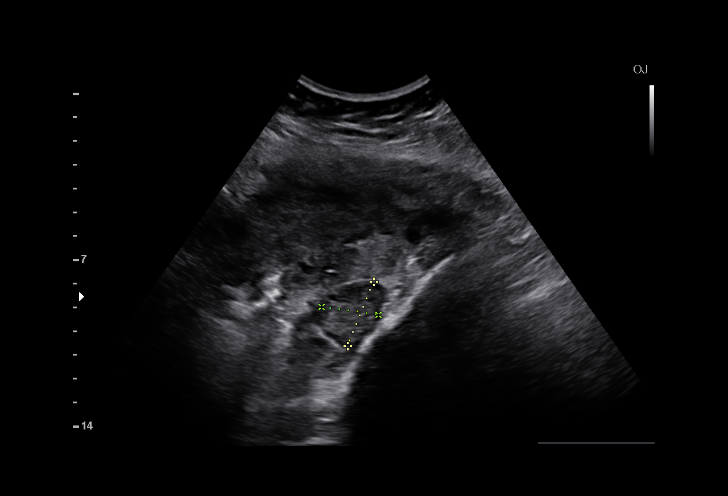
[im 12/20]
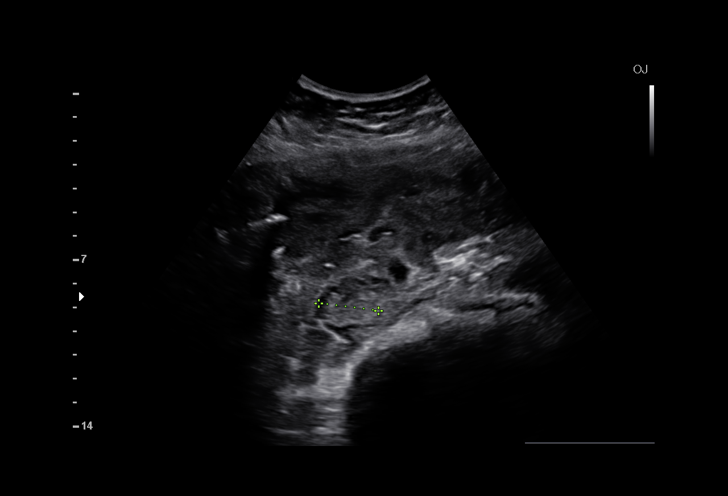
[im 13/20]
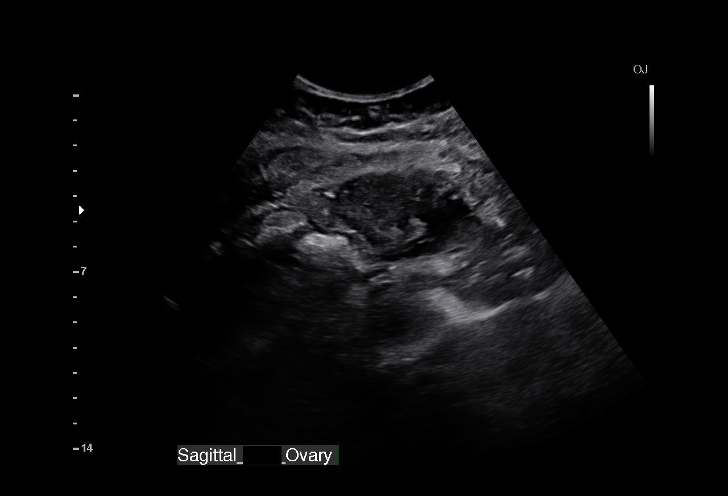
[im 15/20]
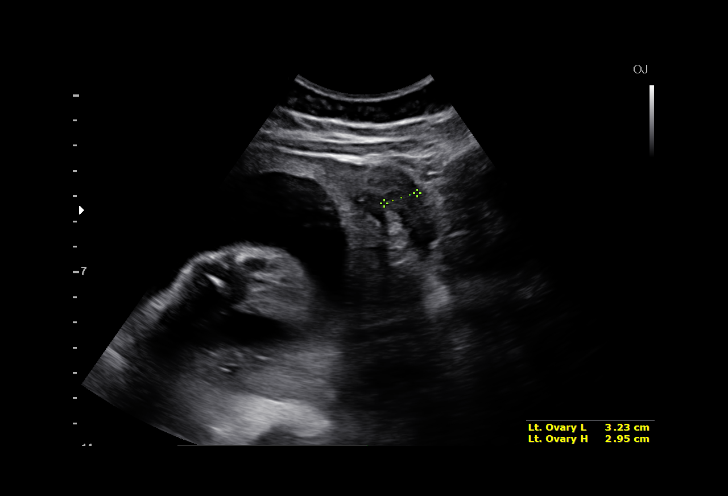
[im 16/20]
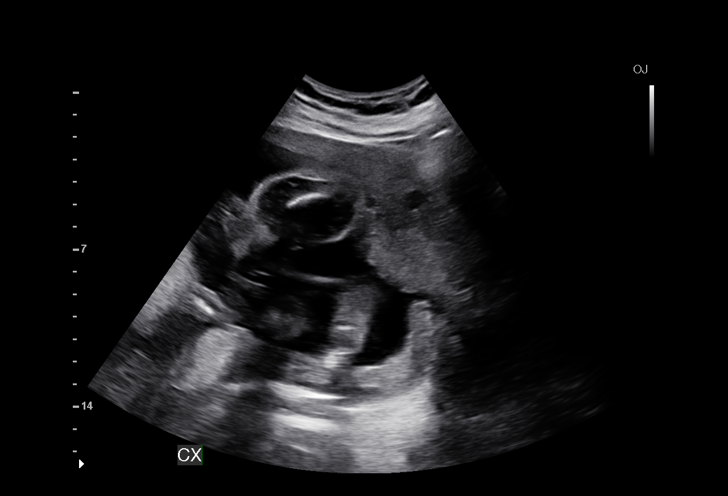
[im 17/20]
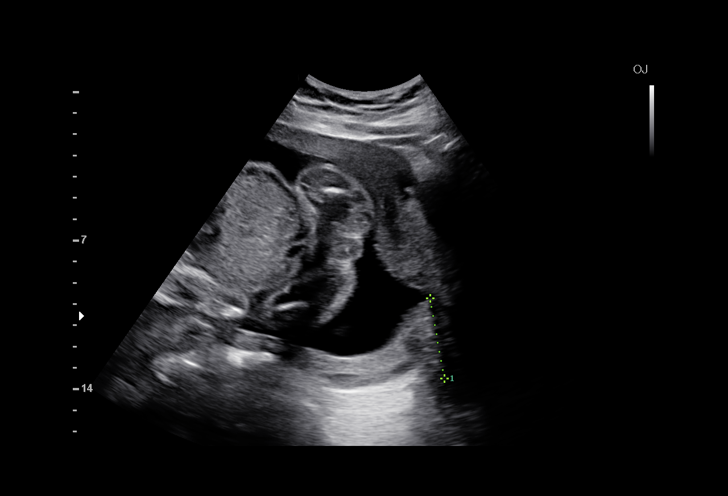
[im 19/20]
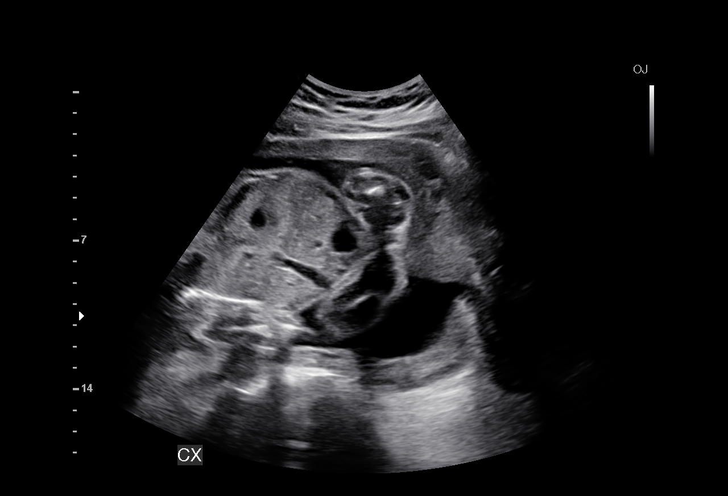
[im 20/20]
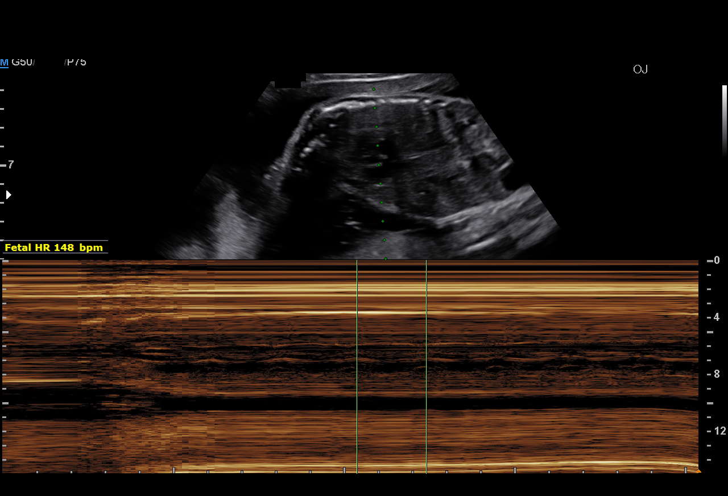

[15 of 20 positions shown; findings below may reference images not displayed]

OBSTETRICS REPORT
(Signed Final [DATE] [DATE])

Date:

By:
Service(s) Provided

[HOSPITAL]                                          76815.0
Indications

26 weeks gestation of pregnancy
Abdominal pain in pregnancy                            [9S]
Fetal Evaluation

Num Of             1
Fetuses:
Fetal Heart        148                          bpm
Rate:
Cardiac Activity:  Observed
Presentation:      Breech
Placenta:          Posterior, above cervical
os
P. Cord            Not well visualized
Insertion:

Amniotic Fluid
AFI FV:      Subjectively within normal limits
Larg Pckt:      5.9  cm
Gestational Age

Best:          26w 0d    Det. By:   Early Ultrasound          EDD:   [DATE]
([DATE])
Cervix Uterus Adnexa

Cervical Length:    3.8       cm

Cervix:       Normal appearance by transabdominal scan.

Left Ovary:    Size(cm) L: 3.23 x W: 1.37 x H: 2.95  Volume(cc):
6.8
Right Ovary:   Size(cm) L: 2.92 x W: 2.53 x H: 2.41  Volume(cc):
9.3
Impression

Single IUP at 26w 0d
Limited ultrasound performed due to abdominal pain
Breech presentation
Posterior placenta - no sonographic findings suggestive of
abruption noted
Normal amniotic fluid volume
Recommendations

Follow-up ultrasounds as clinically indicated.

## 2015-03-02 MED ORDER — LACTATED RINGERS IV BOLUS (SEPSIS)
1000.0000 mL | Freq: Once | INTRAVENOUS | Status: AC
  Administered 2015-03-02: 1000 mL via INTRAVENOUS

## 2015-03-02 MED ORDER — NITROFURANTOIN MONOHYD MACRO 100 MG PO CAPS: 100.0000 mg | ORAL_CAPSULE | Freq: Two times a day (BID) | ORAL | Status: DC

## 2015-03-02 MED ORDER — HYDROCODONE-ACETAMINOPHEN 5-325 MG PO TABS
2.0000 | ORAL_TABLET | Freq: Once | ORAL | Status: AC
Start: 2015-03-02 — End: 2015-03-02
  Administered 2015-03-02: 2 via ORAL
  Filled 2015-03-02: qty 2

## 2015-03-02 NOTE — Discharge Instructions (Signed)

## 2015-03-02 NOTE — MAU Provider Note (Signed)
History     CSN: [id number]  Arrival date and time: 03/02/15 1628   First Provider Initiated Contact with Patient 03/02/15 1649      Chief Complaint  Patient presents with  . Vaginal Bleeding  . Abdominal Pain   HPI    [name] is a 39 y.o. female [phone] at [redacted]w[redacted]d presenting with abdominal pain and vaginal pain. She had intercourse last night after not having intercourse in while (4 months). Intercourse was normal; no pain during intercourse, however she felt like it was dry in her vaginal area. She woke up with excruciating vaginal pain; she felt a lot of pressure. She went to the bathroom and saw small specks of blood. She only saw the blood once when she wiped. She has continued to have vaginal pain and vaginal pressure throughout the day. She did not take anything for pain. She had this pain before and it is very similar to when she had trichomonas. She feels the pain is from sex.   No new partners, however this partner has given her trichomonas twice in this pregnancy. She was last diagnosed with trichomonas last month and took the 7-day treatment. She has avoided intercourse and took the medication as prescribed. She thinks her partner did as well.   OB History    Gravida Para Term Preterm AB TAB SAB Ectopic Multiple Living   10 6 6  0 3  1   6       Past Medical History  Diagnosis Date  . Migraine   . Pregnant 11/12/2014  . AMA (advanced maternal age) multigravida 35+ 11/12/2014  . Round ligament pain 11/12/2014  . Short of breath on exertion 11/12/2014    Past Surgical History  Procedure Laterality Date  . Cholecystectomy      Family History  Problem Relation Age of Onset  . Diabetes Mother   . Hypertension Mother   . Kidney disease Mother   . Alzheimer's disease Father   . Kidney disease Sister   . Asthma Son   . Diabetes Sister   . Asthma Son   . Heart murmur Son     Social History  Substance Use Topics  . Smoking status: Never Smoker   .  Smokeless tobacco: Never Used  . Alcohol Use: No    Allergies: No Known Allergies  No prescriptions prior to admission   Results for orders placed or performed during the hospital encounter of 03/02/15 (from the past 24 hour(s))  Wet prep, genital     Status: Abnormal   Collection Time: 03/02/15  4:50 PM  Result Value Ref Range   Yeast Wet Prep HPF POC NONE SEEN NONE SEEN   Trich, Wet Prep NONE SEEN NONE SEEN   Clue Cells Wet Prep HPF POC NONE SEEN NONE SEEN   WBC, Wet Prep HPF POC FEW (A) NONE SEEN  Urinalysis, Routine w reflex microscopic (not at [facility])     Status: Abnormal   Collection Time: 03/02/15  5:35 PM  Result Value Ref Range   Color, Urine AMBER (A) YELLOW   APPearance CLEAR CLEAR   Specific Gravity, Urine >1.030 (H) 1.005 - 1.030   pH 6.0 5.0 - 8.0   Glucose, UA NEGATIVE NEGATIVE mg/dL   Hgb urine dipstick NEGATIVE NEGATIVE   Bilirubin Urine NEGATIVE NEGATIVE   Ketones, ur 15 (A) NEGATIVE mg/dL   Protein, ur NEGATIVE NEGATIVE mg/dL   Urobilinogen, UA 0.2 0.0 - 1.0 mg/dL   Nitrite NEGATIVE NEGATIVE   Leukocytes,  UA TRACE (A) NEGATIVE  Urine microscopic-add on     Status: Abnormal   Collection Time: 03/02/15  5:35 PM  Result Value Ref Range   WBC, UA 3-6 <3 WBC/hpf   RBC / HPF 0-2 <3 RBC/hpf   Bacteria, UA MANY (A) RARE   Urine-Other MUCOUS PRESENT   CBC with Differential     Status: Abnormal   Collection Time: 03/02/15  7:05 PM  Result Value Ref Range   WBC 12.3 (H) 4.0 - 10.5 K/uL   RBC 4.75 3.87 - 5.11 MIL/uL   Hemoglobin 12.3 12.0 - 15.0 g/dL   HCT 36.9 36.0 - 46.0 %   MCV 77.7 (L) 78.0 - 100.0 fL   MCH 25.9 (L) 26.0 - 34.0 pg   MCHC 33.3 30.0 - 36.0 g/dL   RDW 16.5 (H) 11.5 - 15.5 %   Platelets 194 150 - 400 K/uL   Neutrophils Relative % 85 %   Neutro Abs 10.5 (H) 1.7 - 7.7 K/uL   Lymphocytes Relative 12 %   Lymphs Abs 1.4 0.7 - 4.0 K/uL   Monocytes Relative 3 %   Monocytes Absolute 0.4 0.1 - 1.0 K/uL   Eosinophils Relative 0 %   Eosinophils  Absolute 0.0 0.0 - 0.7 K/uL   Basophils Relative 0 %   Basophils Absolute 0.0 0.0 - 0.1 K/uL    Review of Systems  Constitutional: Negative for fever and chills.  Gastrointestinal: Negative for nausea and vomiting.  Genitourinary:       + vaginal pain    Physical Exam   Blood pressure 101/51, pulse 68, temperature 97.9 F (36.6 C), temperature source Oral, resp. rate 16, last menstrual period 09/01/2014, SpO2 100 %.  Physical Exam  Constitutional: She is oriented to person, place, and time. She appears well-developed and well-nourished. She appears distressed.  HENT:  Head: Normocephalic.  Respiratory: Effort normal.  GI: Soft. There is no tenderness.  Genitourinary:    Uterus is enlarged. Uterus is not tender. Cervix exhibits no motion tenderness, no discharge and no friability. Right adnexum displays no tenderness. Left adnexum displays no tenderness. There is erythema (along vaginal wall ) and tenderness (tenderness with insertion; tenderness with vaginal wall palpation. ) in the vagina. No bleeding in the vagina. No vaginal discharge found.  Dilation: Closed (external OS to 1 cm) Effacement (%): 30 Exam by:: Altamease Oiler, NP  Neurological: She is alert and oriented to person, place, and time.  Skin: Skin is warm. She is not diaphoretic.  Psychiatric: Her behavior is normal.    Fetal Tracing: Baseline: 145 bpm  Variability: moderate  Accelerations: (1) 10x10 Decelerations: quick variables  Toco: None  MAU Course  Procedures  None  MDM  Discussed patient with Dr. Nehemiah Settle. [place] ordered for cervical length.  2 vicodin given in MAU; patient feels significant relief.  3.8 cm cervix on [place]  Bimanual exam: cervix is unchanged from prior exam [facility] LR CBC  1945: patient resting in bed. Pain 6/10 down from 9/10  Report given to [facility]   2031: Patient has had 1L of IV fluids. She reports feeling better at this time.   Assessment and Plan   A:  1. Threatened  preterm labor, second trimester   2. Supervision of normal pregnancy, second trimester   3. Abdominal pain in pregnancy   4. Vaginal pain   5. UTI (lower urinary tract infection)     DC home Comfort measures reviewed  2nd Trimester precautions  Bleeding precautions  PTL precautions  Fetal kick counts RX: macrobid #14  Return to MAU as needed   Follow-up Information    Follow up with [facility].   Specialty:  Obstetrics and Gynecology   Why:  As scheduled   Contact information:   [address] [place], [place] 03/02/2015 8:29 PM

## 2015-03-02 NOTE — MAU Note (Signed)
Pt states she started having vaginal pressure about 1200.  Pt is having some vaginal bleeding requiring her to wear a pantyliner.  Good fetal movement.  Denies ROM or abnormal discharge.  Arrived by EMS.

## 2015-03-03 LAB — GC/CHLAMYDIA PROBE AMP (~~LOC~~) NOT AT ARMC
Chlamydia: NEGATIVE
Neisseria Gonorrhea: NEGATIVE

## 2015-03-03 LAB — URINE CULTURE
CULTURE: NO GROWTH
SPECIAL REQUESTS: NORMAL

## 2015-03-06 ENCOUNTER — Other Ambulatory Visit: Payer: 59

## 2015-03-06 ENCOUNTER — Encounter: Payer: Self-pay | Admitting: Obstetrics & Gynecology

## 2015-03-06 ENCOUNTER — Ambulatory Visit (INDEPENDENT_AMBULATORY_CARE_PROVIDER_SITE_OTHER): Payer: 59 | Admitting: Obstetrics & Gynecology

## 2015-03-06 VITALS — BP 120/80 | HR 80 | Wt 209.0 lb

## 2015-03-06 DIAGNOSIS — Z369 Encounter for antenatal screening, unspecified: Secondary | ICD-10-CM

## 2015-03-06 DIAGNOSIS — Z3492 Encounter for supervision of normal pregnancy, unspecified, second trimester: Secondary | ICD-10-CM

## 2015-03-06 DIAGNOSIS — Z331 Pregnant state, incidental: Secondary | ICD-10-CM

## 2015-03-06 DIAGNOSIS — Z1389 Encounter for screening for other disorder: Secondary | ICD-10-CM

## 2015-03-06 DIAGNOSIS — Z131 Encounter for screening for diabetes mellitus: Secondary | ICD-10-CM

## 2015-03-06 LAB — POCT URINALYSIS DIPSTICK
Blood, UA: NEGATIVE
GLUCOSE UA: NEGATIVE
Ketones, UA: NEGATIVE
LEUKOCYTES UA: NEGATIVE
NITRITE UA: NEGATIVE
PROTEIN UA: NEGATIVE

## 2015-03-06 NOTE — Progress Notes (Signed)
[id number] [redacted]w[redacted]d Estimated Date of Delivery: 06/08/15  Blood pressure 120/80, pulse 80, weight 209 lb (94.802 kg), last menstrual period 09/01/2014.   BP weight and urine results all reviewed and noted.  Please refer to the obstetrical flow sheet for the fundal height and fetal heart rate documentation:  Patient reports good fetal movement, denies any bleeding and no rupture of membranes symptoms or regular contractions. Patient is without complaints. All questions were answered.  Orders Placed This Encounter  Procedures  . POCT urinalysis dipstick    Plan:  Continued routine obstetrical care,   No Follow-up on file.

## 2015-03-07 LAB — CBC
HEMATOCRIT: 37.3 % (ref 34.0–46.6)
HEMOGLOBIN: 12.4 g/dL (ref 11.1–15.9)
MCH: 26.3 pg — AB (ref 26.6–33.0)
MCHC: 33.2 g/dL (ref 31.5–35.7)
MCV: 79 fL (ref 79–97)
Platelets: 196 10*3/uL (ref 150–379)
RBC: 4.72 x10E6/uL (ref 3.77–5.28)
RDW: 15.9 % — ABNORMAL HIGH (ref 12.3–15.4)
WBC: 9.9 10*3/uL (ref 3.4–10.8)

## 2015-03-07 LAB — GLUCOSE TOLERANCE, 2 HOURS W/ 1HR
GLUCOSE, 1 HOUR: 113 mg/dL (ref 65–179)
GLUCOSE, 2 HOUR: 85 mg/dL (ref 65–152)
Glucose, Fasting: 81 mg/dL (ref 65–91)

## 2015-03-07 LAB — ANTIBODY SCREEN: ANTIBODY SCREEN: NEGATIVE

## 2015-03-07 LAB — HIV ANTIBODY (ROUTINE TESTING W REFLEX): HIV Screen 4th Generation wRfx: NONREACTIVE

## 2015-03-07 LAB — RPR: RPR Ser Ql: NONREACTIVE

## 2015-03-26 ENCOUNTER — Ambulatory Visit (INDEPENDENT_AMBULATORY_CARE_PROVIDER_SITE_OTHER): Payer: 59 | Admitting: Advanced Practice Midwife

## 2015-03-26 ENCOUNTER — Encounter: Payer: Self-pay | Admitting: Advanced Practice Midwife

## 2015-03-26 VITALS — BP 120/70 | HR 74 | Wt 206.0 lb

## 2015-03-26 DIAGNOSIS — Z1389 Encounter for screening for other disorder: Secondary | ICD-10-CM

## 2015-03-26 DIAGNOSIS — Z331 Pregnant state, incidental: Secondary | ICD-10-CM

## 2015-03-26 DIAGNOSIS — O09523 Supervision of elderly multigravida, third trimester: Secondary | ICD-10-CM

## 2015-03-26 DIAGNOSIS — Z3493 Encounter for supervision of normal pregnancy, unspecified, third trimester: Secondary | ICD-10-CM

## 2015-03-26 LAB — POCT URINALYSIS DIPSTICK
GLUCOSE UA: NEGATIVE
Ketones, UA: NEGATIVE
NITRITE UA: NEGATIVE
PROTEIN UA: NEGATIVE
RBC UA: NEGATIVE

## 2015-03-26 NOTE — Patient Instructions (Signed)

## 2015-03-26 NOTE — Progress Notes (Signed)
[id number] [redacted]w[redacted]d Estimated Date of Delivery: 06/08/15  Blood pressure 120/70, pulse 74, weight 206 lb (93.441 kg), last menstrual period 09/01/2014.   BP weight and urine results all reviewed and noted.  Please refer to the obstetrical flow sheet for the fundal height and fetal heart rate documentation:  Patient reports good fetal movement, denies any bleeding and no rupture of membranes symptoms or regular contractions. Patient is without complaints. Eats once or twice a day, works second shift.  All questions were answered.  Orders Placed This Encounter  Procedures  . US OB Follow Up  . POCT urinalysis dipstick    Plan:  Continued routine obstetrical care,   Return in about 3 weeks (around 04/16/2015) for LROB, US:EFW, US:BPP.

## 2015-03-30 IMAGING — US US ABDOMEN COMPLETE
1 series · 14 of 25 positions shown · non-contrast
Comparison: CT abdomen and pelvis [DATE]

CLINICAL DATA: One day history of generalized abdominal pain

EXAM:
ABDOMEN ULTRASOUND COMPLETE

[Series 1: us abdomen complete · 0.24mm/px · 14 of 108 slices shown]
[im 1/108]
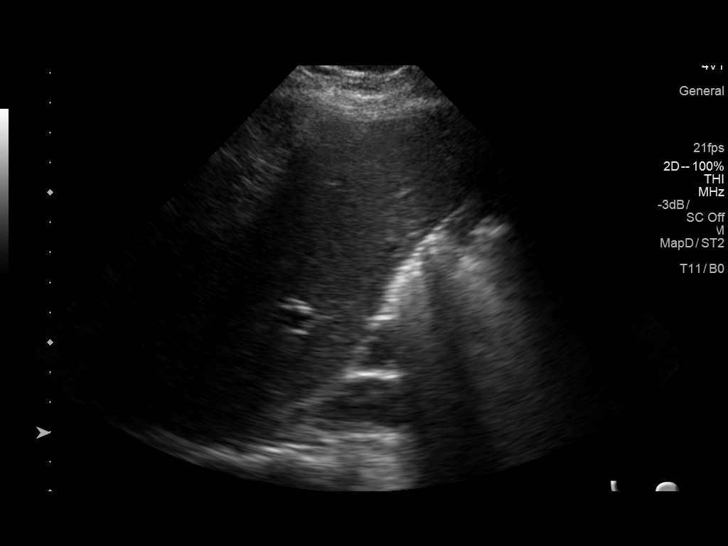
[im 9/108]
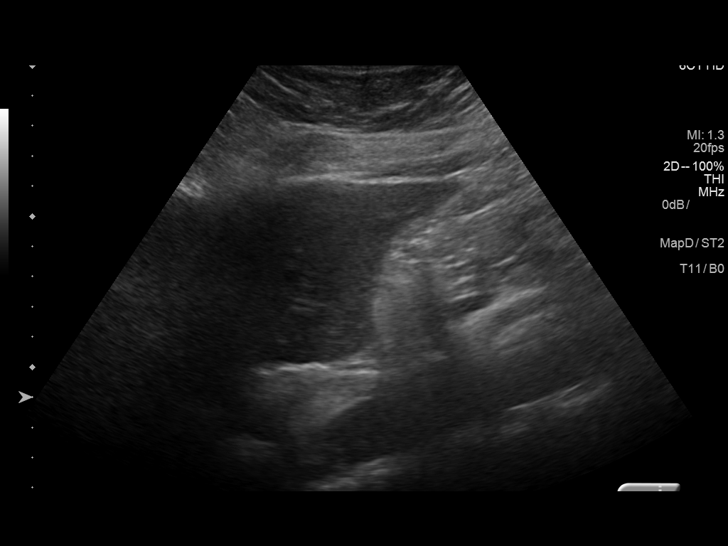
[im 18/108]
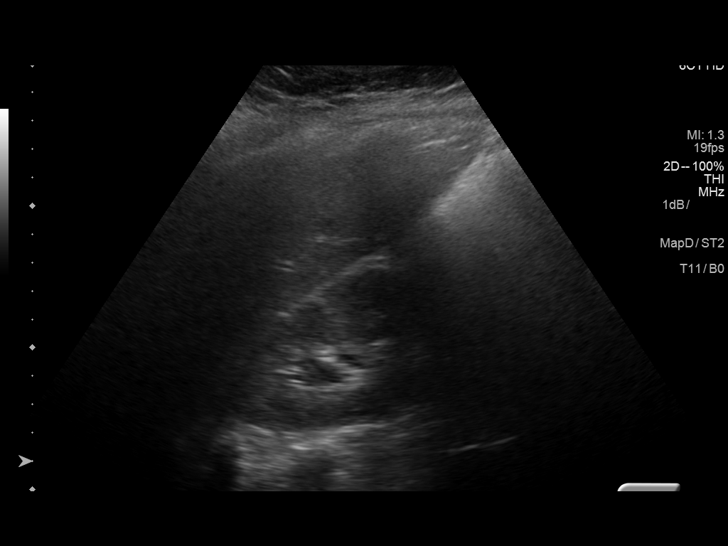
[im 27/108]
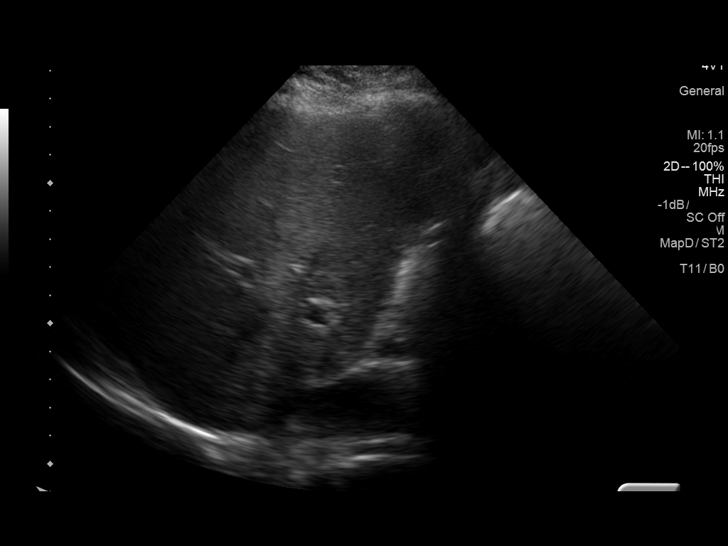
[im 36/108]
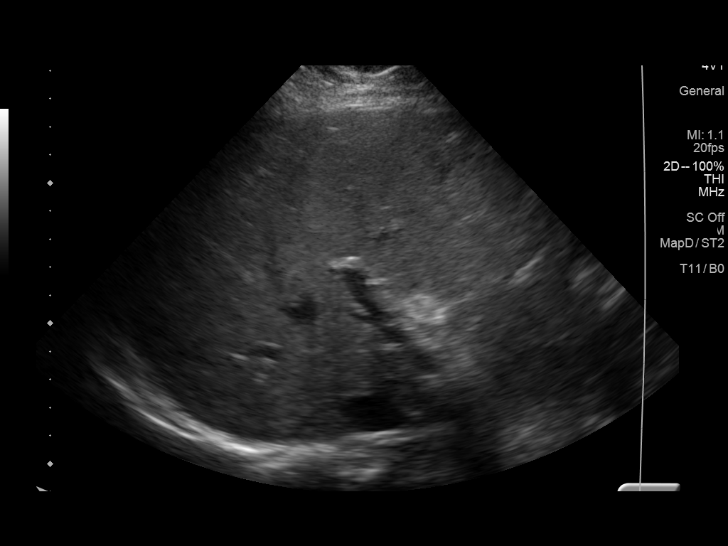
[im 41/108]
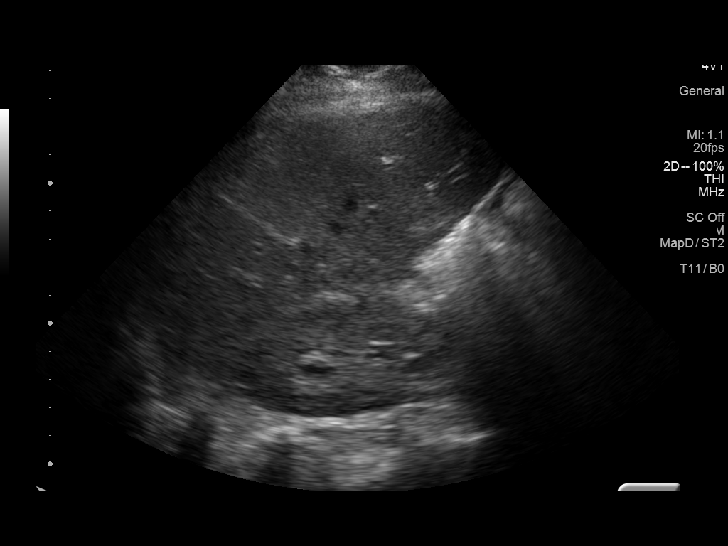
[im 50/108]
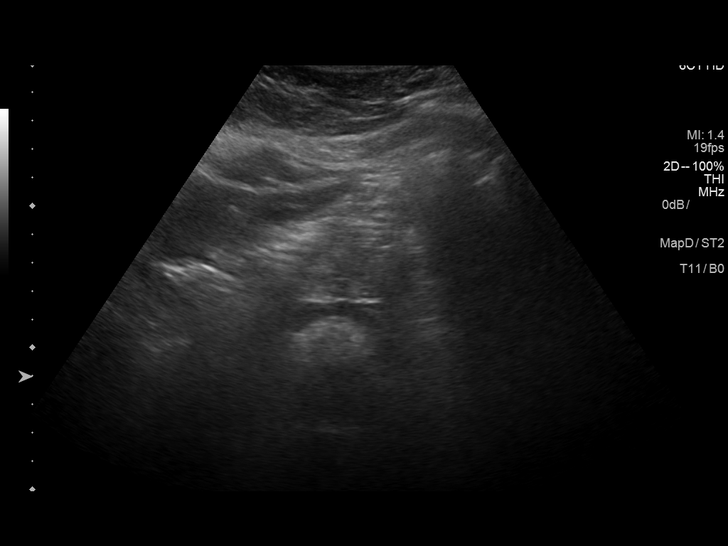
[im 58/108]
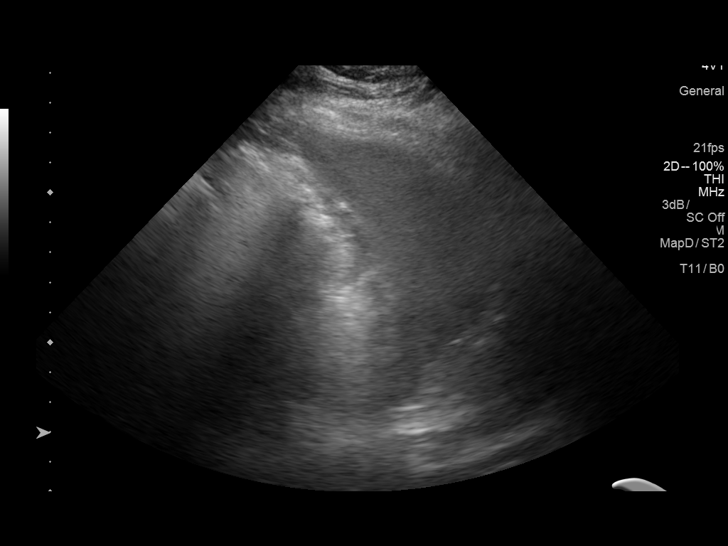
[im 67/108]
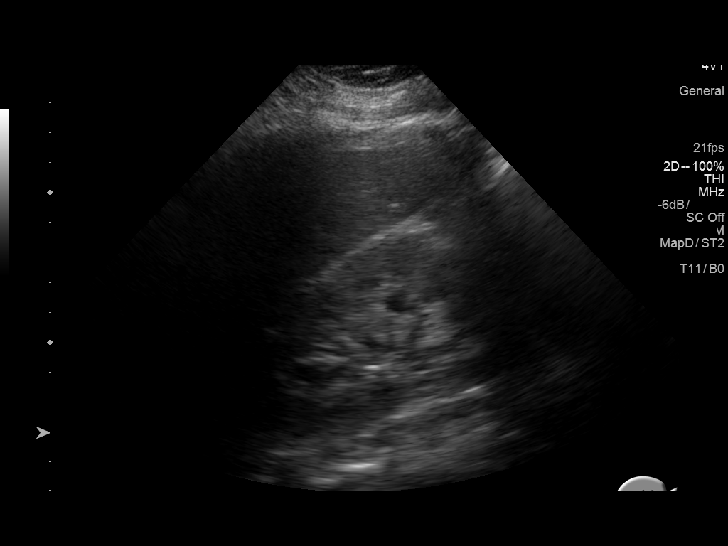
[im 72/108]
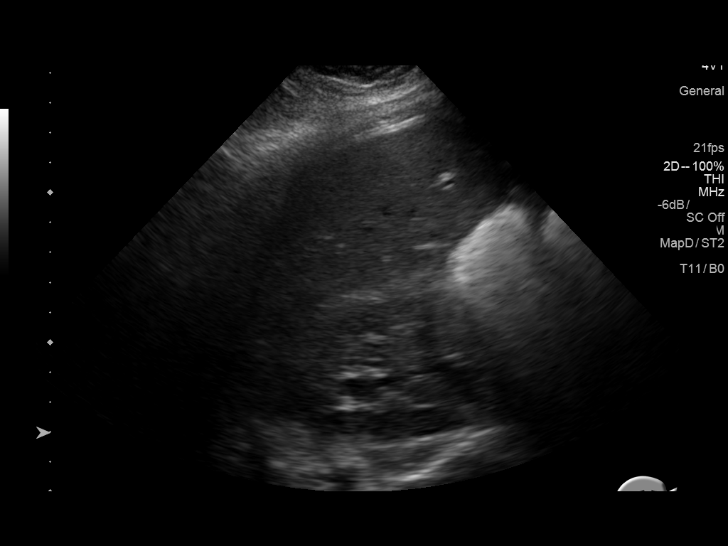
[im 81/108]
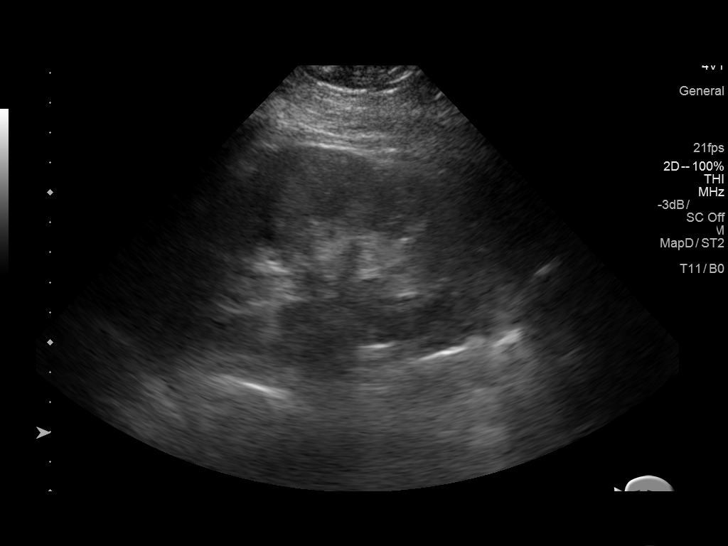
[im 90/108]
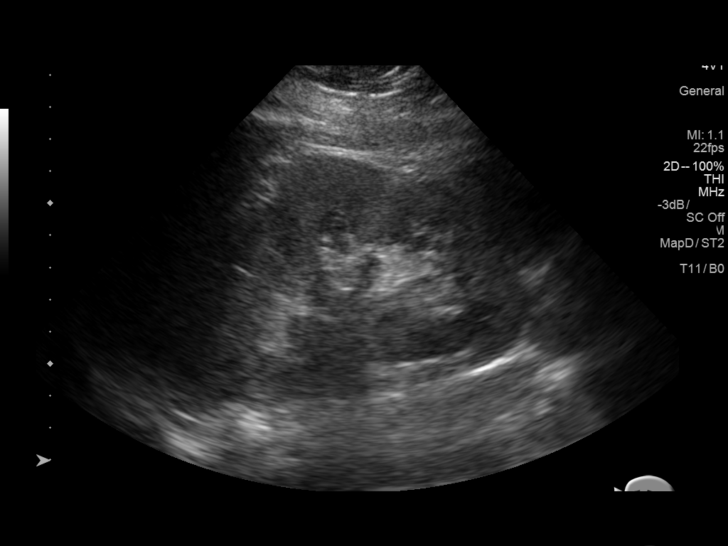
[im 99/108]
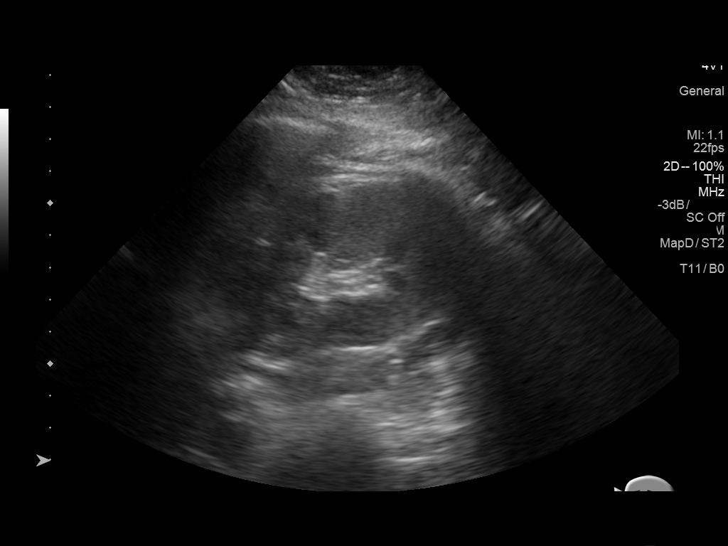
[im 108/108]
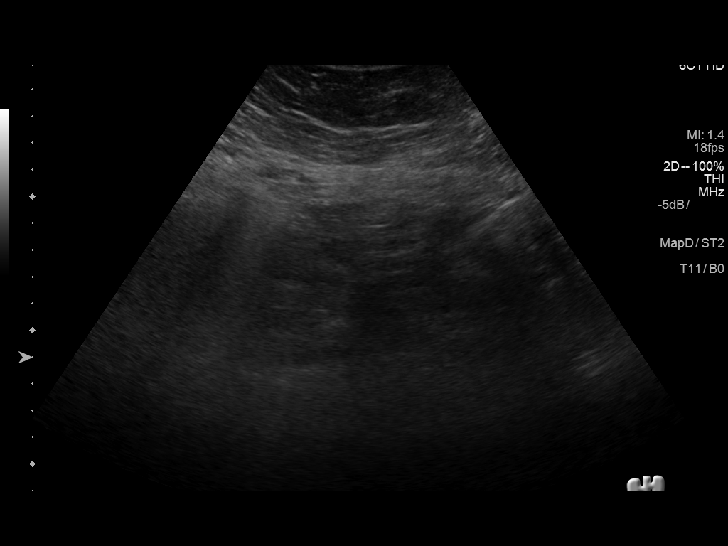

[14 of 25 positions shown; findings below may reference images not displayed]

FINDINGS: Gallbladder: Surgically absent.

Common bile duct: Diameter: 3 mm. There is no intrahepatic, common
hepatic, or common bile duct dilatation.

Liver: No focal lesion identified. Within normal limits in
parenchymal echogenicity.

IVC: No abnormality visualized.

Pancreas: Visualized portion unremarkable. Portions of pancreas
obscured by gas.

Spleen: Size and appearance within normal limits.

Right Kidney: Length: 11.6 cm. Echogenicity within normal limits. No
mass or hydronephrosis visualized.

Left Kidney: Length: 12.1 cm. Echogenicity within normal limits. No
mass or hydronephrosis visualized.

Abdominal aorta: No aneurysm visualized.

Other findings: No demonstrable ascites.
IMPRESSION: Gallbladder absent. Portions of pancreas obscured by gas. Visualized
portions of pancreas appear normal. Study otherwise unremarkable.

## 2015-03-30 IMAGING — US US OB COMP +14 WK
1 series · 14 of 28 positions shown · non-contrast
Comparison: none

CLINICAL DATA: Diffuse lower abdominal pain for 1 day, positive
pregnancy

EXAM:
OBSTETRICAL ULTRASOUND >14 WKS

[Series 1: us ob comp +14 wk · 0.30mm/px · 45 acquisitions, 14 frames shown]
[im 2/45]
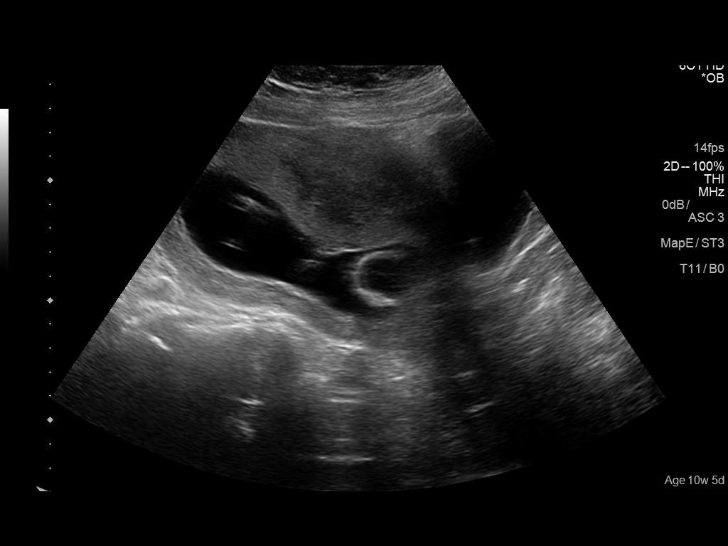
[im 5/45]
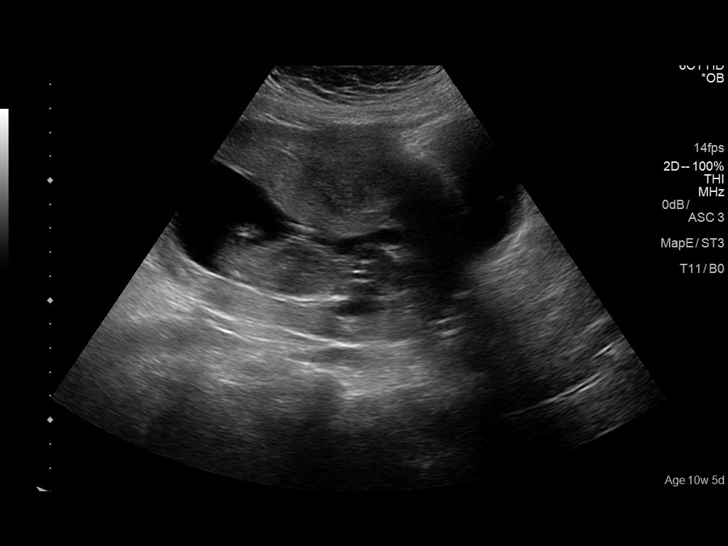
[im 9/45]
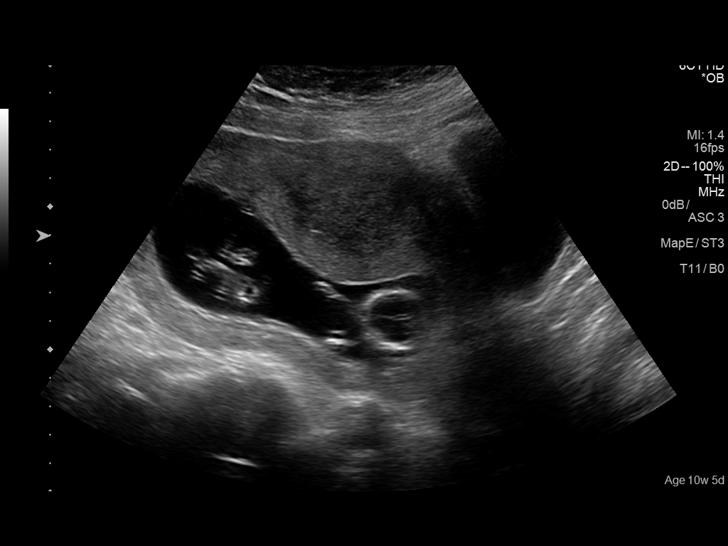
[im 12/45]
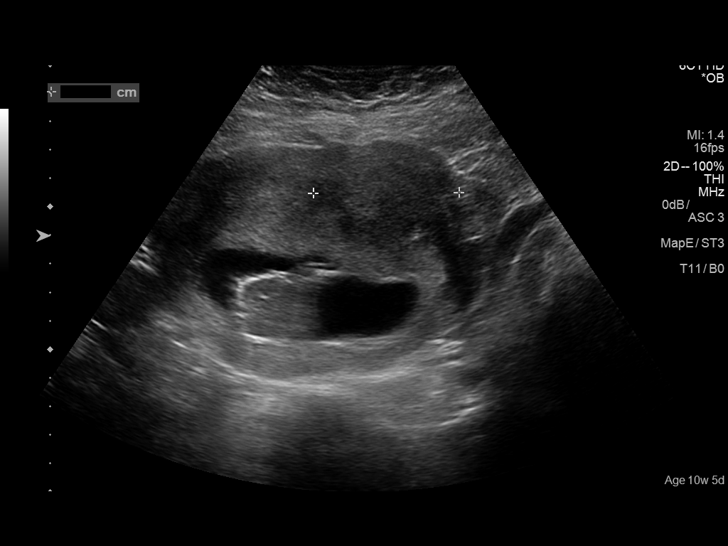
[im 15/45]
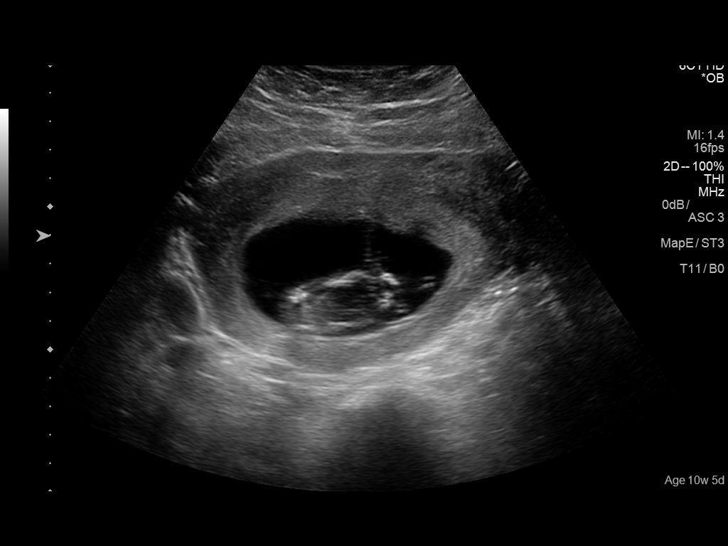
[im 18/45]
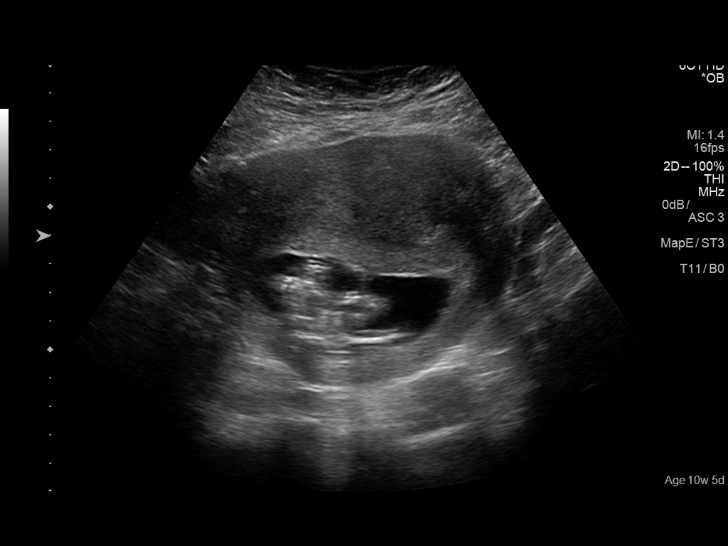
[im 22/45]
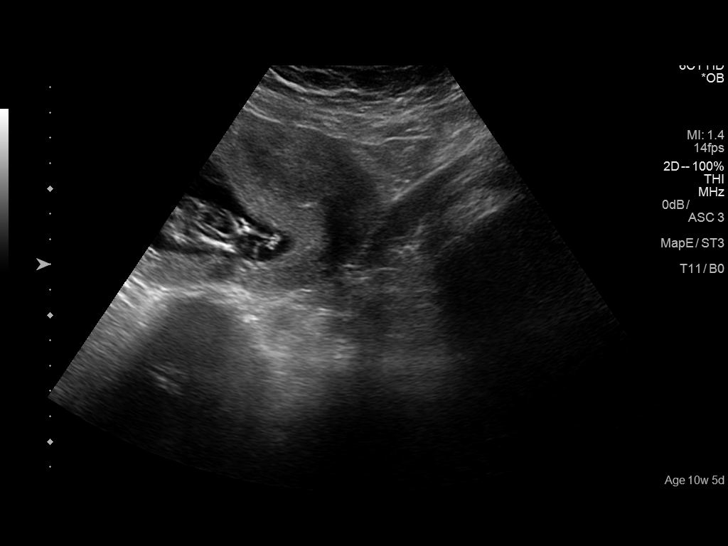
[im 25/45]
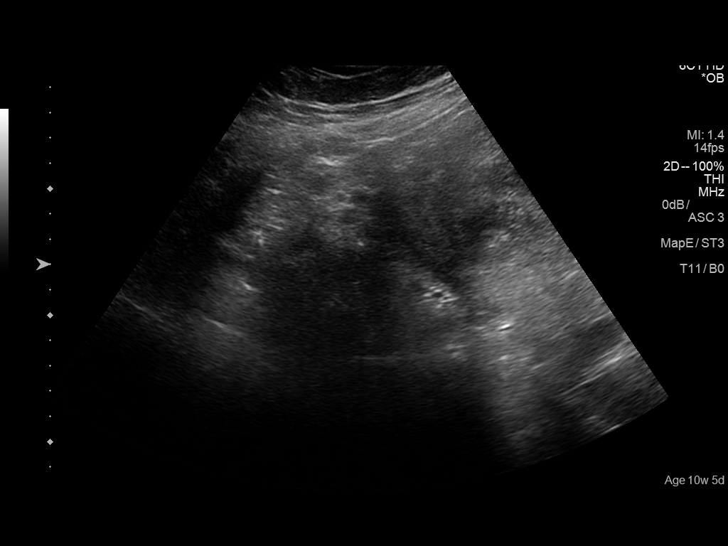
[im 28/45]
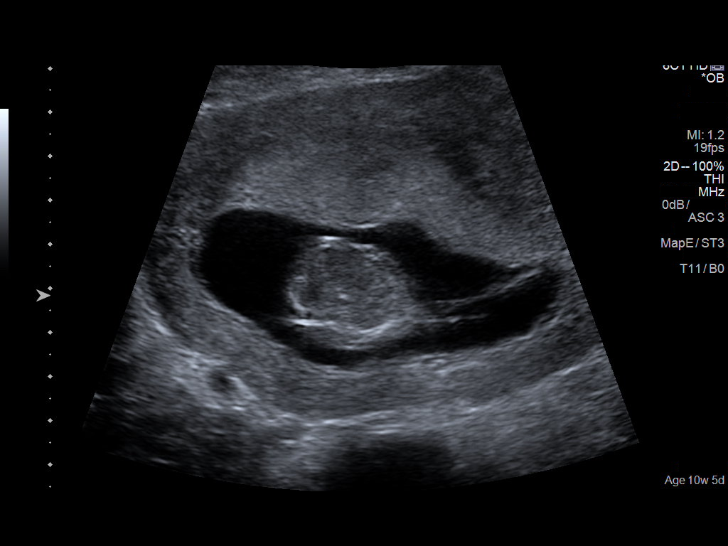
[im 31/45]
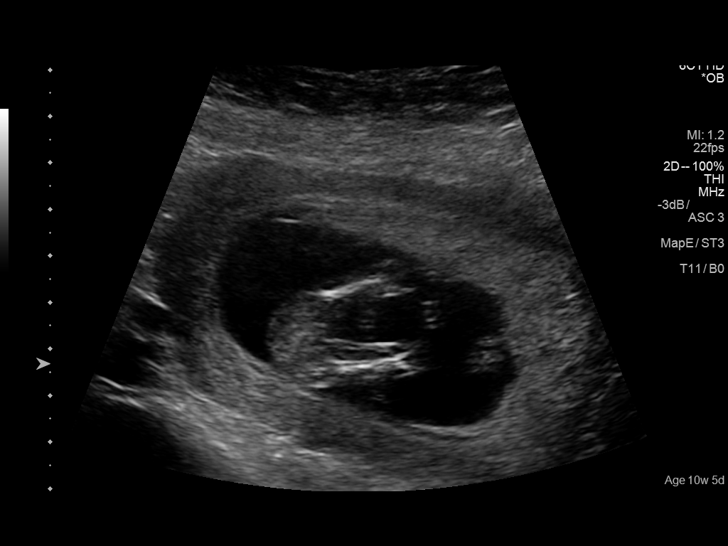
[im 35/45]
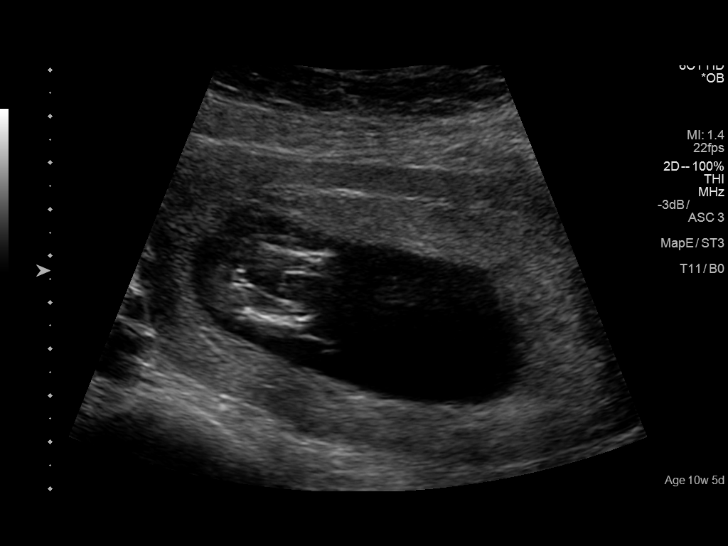
[im 38/45]
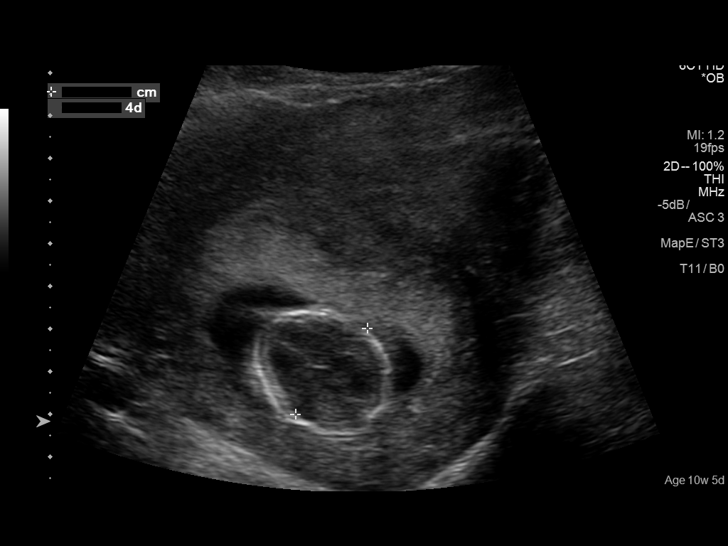
[im 41/45]
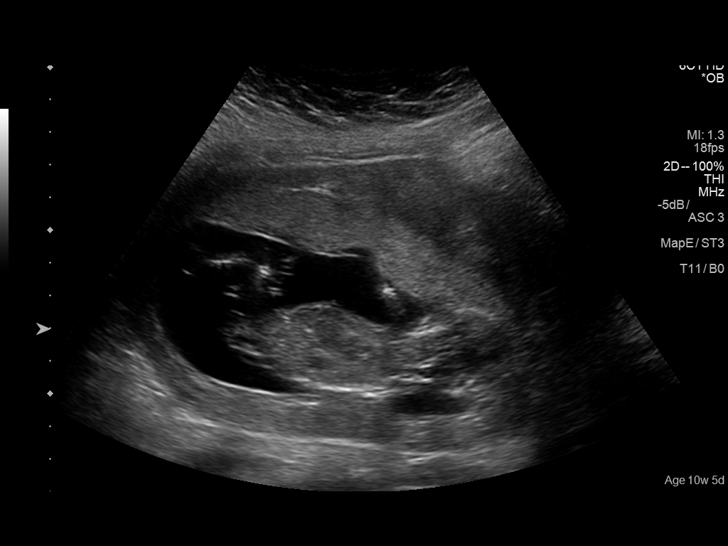
[im 45/45]
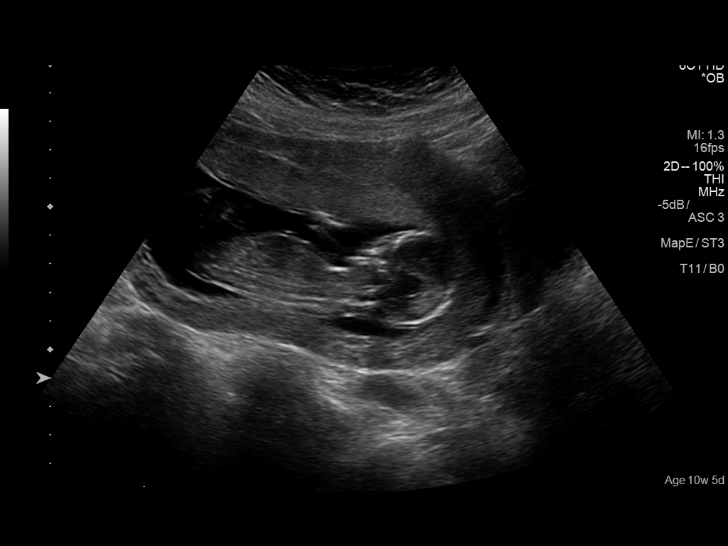

[14 of 28 positions shown; findings below may reference images not displayed]

FINDINGS: Number of Fetuses: 1

Heart Rate:  171 bpm

Movement: Visualized

Presentation: Cephalic

Previa: No placenta previa

Placental Location: Anterior left

Amniotic Fluid (Subjective): Normal

FETAL BIOMETRY

BPD:  2.7 cmcm 14w 5d

Current Mean GA: 14w 3d              US EDC: [DATE]

Maternal Findings:

Cervix: Question anterior uterine fibroid within uterus measures
x 4.3 x 5.1 cm.

The cervix is closed.
IMPRESSION: 1. There is a single live intrauterine gestation cephalic
presentation. Fetal heart rate 171. Normal amniotic fluid. No
placenta previa. BPD measures 2.7 cm corresponding to gestational
age 14 weeks 5 days. EDC by ultrasound [DATE]. The cervix is
closed. Question anterior uterine fibroid measures 6.1 x 5.1 cm.
Attention should be given on follow-up Ob ultrasound.

## 2015-04-06 ENCOUNTER — Inpatient Hospital Stay (HOSPITAL_COMMUNITY)
Admission: AD | Admit: 2015-04-06 | Discharge: 2015-04-07 | Disposition: A | Discharge status: Home / Self Care | Payer: 59 | Source: Ambulatory Visit | Attending: Family Medicine | Admitting: Family Medicine

## 2015-04-06 DIAGNOSIS — A084 Viral intestinal infection, unspecified: Secondary | ICD-10-CM | POA: Insufficient documentation

## 2015-04-06 DIAGNOSIS — Z3A31 31 weeks gestation of pregnancy: Secondary | ICD-10-CM | POA: Insufficient documentation

## 2015-04-06 DIAGNOSIS — O99613 Diseases of the digestive system complicating pregnancy, third trimester: Secondary | ICD-10-CM | POA: Insufficient documentation

## 2015-04-07 ENCOUNTER — Encounter (HOSPITAL_COMMUNITY): Payer: Self-pay | Admitting: *Deleted

## 2015-04-07 DIAGNOSIS — O99613 Diseases of the digestive system complicating pregnancy, third trimester: Secondary | ICD-10-CM | POA: Diagnosis not present

## 2015-04-07 DIAGNOSIS — A084 Viral intestinal infection, unspecified: Secondary | ICD-10-CM | POA: Diagnosis not present

## 2015-04-07 DIAGNOSIS — O212 Late vomiting of pregnancy: Secondary | ICD-10-CM | POA: Diagnosis present

## 2015-04-07 DIAGNOSIS — Z3A31 31 weeks gestation of pregnancy: Secondary | ICD-10-CM | POA: Diagnosis not present

## 2015-04-07 LAB — URINALYSIS, ROUTINE W REFLEX MICROSCOPIC
BILIRUBIN URINE: NEGATIVE
Glucose, UA: NEGATIVE mg/dL
HGB URINE DIPSTICK: NEGATIVE
Ketones, ur: NEGATIVE mg/dL
NITRITE: NEGATIVE
PROTEIN: NEGATIVE mg/dL
Specific Gravity, Urine: <1.005 — ABNORMAL LOW (ref 1.005–1.030)
UROBILINOGEN UA: 0.2 mg/dL (ref 0.0–1.0)
pH: 6.5 (ref 5.0–8.0)

## 2015-04-07 LAB — COMPREHENSIVE METABOLIC PANEL
ALT: 16 U/L (ref 14–54)
AST: 20 U/L (ref 15–41)
Albumin: 2.9 g/dL — ABNORMAL LOW (ref 3.5–5.0)
Alkaline Phosphatase: 62 U/L (ref 38–126)
Anion gap: 8 (ref 5–15)
BUN: <5 mg/dL — ABNORMAL LOW (ref 6–20)
CO2: 20 mmol/L — ABNORMAL LOW (ref 22–32)
Calcium: 8.9 mg/dL (ref 8.9–10.3)
Chloride: 107 mmol/L (ref 101–111)
Creatinine, Ser: 0.45 mg/dL (ref 0.44–1.00)
GFR calc Af Amer: >60 mL/min (ref >60)
GFR calc non Af Amer: >60 mL/min (ref >60)
Glucose, Bld: 87 mg/dL (ref 65–99)
Potassium: 3.5 mmol/L (ref 3.5–5.1)
Sodium: 135 mmol/L (ref 135–145)
Total Bilirubin: 0.5 mg/dL (ref 0.3–1.2)
Total Protein: 6.9 g/dL (ref 6.5–8.1)

## 2015-04-07 LAB — URINE MICROSCOPIC-ADD ON

## 2015-04-07 LAB — CBC
HCT: 36.7 % (ref 36.0–46.0)
Hemoglobin: 12.3 g/dL (ref 12.0–15.0)
MCH: 26.7 pg (ref 26.0–34.0)
MCHC: 33.5 g/dL (ref 30.0–36.0)
MCV: 79.6 fL (ref 78.0–100.0)
Platelets: 184 10*3/uL (ref 150–400)
RBC: 4.61 MIL/uL (ref 3.87–5.11)
RDW: 14.6 % (ref 11.5–15.5)
WBC: 9.8 10*3/uL (ref 4.0–10.5)

## 2015-04-07 MED ORDER — DEXTROSE-NACL 5-0.45 % IV SOLN: INTRAVENOUS | Status: DC

## 2015-04-07 MED ORDER — DEXTROSE IN LACTATED RINGERS 5 % IV SOLN
INTRAVENOUS | Status: DC
  Administered 2015-04-07: 02:00:00 via INTRAVENOUS

## 2015-04-07 MED ORDER — SODIUM CHLORIDE 0.9 % IV SOLN
8.0000 mg | Freq: Once | INTRAVENOUS | Status: AC
  Administered 2015-04-07: 8 mg via INTRAVENOUS
  Filled 2015-04-07: qty 4

## 2015-04-07 MED ORDER — DIPHENOXYLATE-ATROPINE 2.5-0.025 MG PO TABS
1.0000 | ORAL_TABLET | Freq: Once | ORAL | Status: AC
  Administered 2015-04-07: 1 via ORAL
  Filled 2015-04-07: qty 1

## 2015-04-07 MED ORDER — DEXTROSE 5 % IN LACTATED RINGERS IV BOLUS
1000.0000 mL | Freq: Once | INTRAVENOUS | Status: AC
  Administered 2015-04-07: 1000 mL via INTRAVENOUS

## 2015-04-07 MED ORDER — PROMETHAZINE HCL 25 MG PO TABS: 25.0000 mg | ORAL_TABLET | Freq: Four times a day (QID) | ORAL | Status: DC | PRN

## 2015-04-07 MED ORDER — BUTALBITAL-APAP-CAFFEINE 50-325-40 MG PO TABS
2.0000 | ORAL_TABLET | Freq: Once | ORAL | Status: AC
  Administered 2015-04-07: 2 via ORAL
  Filled 2015-04-07: qty 2

## 2015-04-07 NOTE — MAU Note (Signed)
Pt c/o HA, feeling cold and having vaginal discharge. HA started yesterday-took tylenol, did not help. Vaginal discharge started today. Pt c/o N/V/D-yesterday. Denies fever.

## 2015-04-07 NOTE — Discharge Instructions (Signed)

## 2015-04-07 NOTE — MAU Provider Note (Signed)
History  Joyce [name] is a 39yo [id number] at [redacted]w[redacted]d who presents with nausea, vomiting, diarrhea, and headache.  She endorses vomiting which began on Friday. She has not been vomiting throughout her pregnancy and this was new. She then developed a headache yesterday and has been taking Tylenol since with no relief. She then developed watery diarrhea this morning. She has had almost 10 episodes of diarrhea since. She was treated with antibiotics for a UTI about a month ago. She has not had any sick contacts. She says she feels weak and tired but not feverish with chills. She is also having dizziness but denies changes in vision or blurry vision.  She also endorses pelvic pain/pressure with urination. She has noted thick mucus discharge from her vagina. She denies burning with urination or urinary frequency. She denies vaginal itching. She denies vaginal bleeding or loss of fluid. She is not feeling contractions. She does feel good fetal movement.  She has not had any other problems within this pregnancy.  CSN: [id number]  Arrival date and time: 04/06/15 2352   None     Chief Complaint  Patient presents with  . Morning Sickness  . Emesis During Pregnancy  . Diarrhea  . Abdominal Pain   Diarrhea  This is a new problem. The problem occurs 5 to 10 times per day. The problem has been unchanged. The stool consistency is described as watery. Associated symptoms include abdominal pain, chills, headaches, sweats and vomiting. Pertinent negatives include no coughing, fever or URI. Risk factors include recent antibiotic use. She has tried analgesics for the symptoms.  Abdominal Pain Associated symptoms include diarrhea, dysuria, headaches, nausea and vomiting. Pertinent negatives include no constipation, fever, frequency or hematuria.     Past Medical History  Diagnosis Date  . Migraine   . Pregnant 11/12/2014  . AMA (advanced maternal age) multigravida 35+ 11/12/2014  . Round ligament pain  11/12/2014  . Short of breath on exertion 11/12/2014    Past Surgical History  Procedure Laterality Date  . Cholecystectomy      Family History  Problem Relation Age of Onset  . Diabetes Mother   . Hypertension Mother   . Kidney disease Mother   . Alzheimer's disease Father   . Kidney disease Sister   . Asthma Son   . Diabetes Sister   . Asthma Son   . Heart murmur Son     Social History  Substance Use Topics  . Smoking status: Never Smoker   . Smokeless tobacco: Never Used  . Alcohol Use: No    Allergies: No Known Allergies  Prescriptions prior to admission  Medication Sig Dispense Refill Last Dose  . Iron-FA-B Cmp-C-Biot-Probiotic (FUSION PLUS) CAPS Take 1 capsule by mouth See admin instructions. 1 time daily between meals 30 capsule 6 04/06/2015 at Unknown time  . prenatal vitamin w/FE, FA (PRENATAL 1 + 1) 27-1 MG TABS tablet Take 1 tablet by mouth daily at 12 noon. 30 each 11 04/06/2015 at Unknown time    Review of Systems  Constitutional: Positive for chills and malaise/fatigue. Negative for fever.  HENT: Negative for hearing loss.   Eyes: Positive for blurred vision. Negative for double vision.  Respiratory: Negative for cough.   Cardiovascular: Negative for chest pain and leg swelling.  Gastrointestinal: Positive for nausea, vomiting, abdominal pain and diarrhea. Negative for constipation and blood in stool.  Genitourinary: Positive for dysuria. Negative for urgency, frequency and hematuria.  Neurological: Positive for dizziness, weakness and headaches.  Endo/Heme/Allergies: Negative  for polydipsia.   Physical Exam   Blood pressure 117/65, pulse 74, temperature 97.9 F (36.6 C), temperature source Oral, resp. rate 20, height 5' 5.2" (1.656 m), weight 91.989 kg (202 lb 12.8 oz), last menstrual period 09/01/2014, SpO2 100 %.  Physical Exam  Constitutional: She is oriented to person, place, and time. She appears well-developed. She appears distressed.   Cardiovascular: Normal rate and regular rhythm.   Murmur heard. Split S2  Respiratory: Effort normal and breath sounds normal. No respiratory distress. She has no wheezes. She has no rales. She exhibits no tenderness.  GI: Bowel sounds are normal. She exhibits mass. There is no tenderness.  Gravid  Musculoskeletal: She exhibits no edema.  Neurological: She is alert and oriented to person, place, and time.  Skin: Skin is dry. No rash noted. No erythema.   Results for orders placed or performed during the hospital encounter of 04/06/15 (from the past 24 hour(s))  Urinalysis, Routine w reflex microscopic (not at [facility])     Status: Abnormal   Collection Time: 04/07/15 12:05 AM  Result Value Ref Range   Color, Urine YELLOW YELLOW   APPearance CLEAR CLEAR   Specific Gravity, Urine <1.005 (L) 1.005 - 1.030   pH 6.5 5.0 - 8.0   Glucose, UA NEGATIVE NEGATIVE mg/dL   Hgb urine dipstick NEGATIVE NEGATIVE   Bilirubin Urine NEGATIVE NEGATIVE   Ketones, ur NEGATIVE NEGATIVE mg/dL   Protein, ur NEGATIVE NEGATIVE mg/dL   Urobilinogen, UA 0.2 0.0 - 1.0 mg/dL   Nitrite NEGATIVE NEGATIVE   Leukocytes, UA TRACE (A) NEGATIVE  Urine microscopic-add on     Status: None   Collection Time: 04/07/15 12:05 AM  Result Value Ref Range   Squamous Epithelial / LPF RARE RARE   WBC, UA 0-2 <3 WBC/hpf   RBC / HPF 0-2 <3 RBC/hpf   Bacteria, UA RARE RARE  CBC     Status: None   Collection Time: 04/07/15 12:55 AM  Result Value Ref Range   WBC 9.8 4.0 - 10.5 K/uL   RBC 4.61 3.87 - 5.11 MIL/uL   Hemoglobin 12.3 12.0 - 15.0 g/dL   HCT 36.7 36.0 - 46.0 %   MCV 79.6 78.0 - 100.0 fL   MCH 26.7 26.0 - 34.0 pg   MCHC 33.5 30.0 - 36.0 g/dL   RDW 14.6 11.5 - 15.5 %   Platelets 184 150 - 400 K/uL  Comprehensive metabolic panel     Status: Abnormal   Collection Time: 04/07/15 12:55 AM  Result Value Ref Range   Sodium 135 135 - 145 mmol/L   Potassium 3.5 3.5 - 5.1 mmol/L   Chloride 107 101 - 111 mmol/L   CO2 20  (L) 22 - 32 mmol/L   Glucose, Bld 87 65 - 99 mg/dL   BUN <5 (L) 6 - 20 mg/dL   Creatinine, Ser 0.45 0.44 - 1.00 mg/dL   Calcium 8.9 8.9 - 10.3 mg/dL   Total Protein 6.9 6.5 - 8.1 g/dL   Albumin 2.9 (L) 3.5 - 5.0 g/dL   AST 20 15 - 41 U/L   ALT 16 14 - 54 U/L   Alkaline Phosphatase 62 38 - 126 U/L   Total Bilirubin 0.5 0.3 - 1.2 mg/dL   GFR calc non Af Amer >60 >60 mL/min   GFR calc Af Amer >60 >60 mL/min   Anion gap 8 5 - 15   MAU Course  Procedures  MDM Started 1L bolus D5LR. Also gave Zofran for  nausea/vomiting and Lomotil for diarrhea.Pt is feeling better after fluids and will be discharged to home. FHT's Cat 1. No contractions  Assessment and Plan  Viral Gastroenteritis Rx: Phenergan Discharge   Clemmons,Lori Grissett 04/07/2015, 12:58 AM

## 2015-04-15 ENCOUNTER — Other Ambulatory Visit: Payer: Self-pay | Admitting: Advanced Practice Midwife

## 2015-04-15 DIAGNOSIS — O09523 Supervision of elderly multigravida, third trimester: Secondary | ICD-10-CM

## 2015-04-16 ENCOUNTER — Encounter: Payer: Self-pay | Admitting: Obstetrics & Gynecology

## 2015-04-16 ENCOUNTER — Ambulatory Visit (INDEPENDENT_AMBULATORY_CARE_PROVIDER_SITE_OTHER): Payer: 59

## 2015-04-16 ENCOUNTER — Ambulatory Visit: Payer: 59

## 2015-04-16 ENCOUNTER — Ambulatory Visit (INDEPENDENT_AMBULATORY_CARE_PROVIDER_SITE_OTHER): Payer: 59 | Admitting: Obstetrics & Gynecology

## 2015-04-16 VITALS — BP 118/80 | HR 74 | Wt 203.0 lb

## 2015-04-16 DIAGNOSIS — O09523 Supervision of elderly multigravida, third trimester: Secondary | ICD-10-CM

## 2015-04-16 DIAGNOSIS — Z1389 Encounter for screening for other disorder: Secondary | ICD-10-CM

## 2015-04-16 DIAGNOSIS — Z331 Pregnant state, incidental: Secondary | ICD-10-CM

## 2015-04-16 DIAGNOSIS — Z3493 Encounter for supervision of normal pregnancy, unspecified, third trimester: Secondary | ICD-10-CM

## 2015-04-16 LAB — POCT URINALYSIS DIPSTICK
Blood, UA: NEGATIVE
Glucose, UA: NEGATIVE
LEUKOCYTES UA: NEGATIVE
Nitrite, UA: NEGATIVE
Protein, UA: NEGATIVE

## 2015-04-16 NOTE — Progress Notes (Signed)
[id number] [redacted]w[redacted]d Estimated Date of Delivery: 06/08/15  Blood pressure 118/80, pulse 74, weight 203 lb (92.08 kg), last menstrual period 09/01/2014.   BP weight and urine results all reviewed and noted.  Please refer to the obstetrical flow sheet for the fundal height and fetal heart rate documentation:  Patient reports good fetal movement, denies any bleeding and no rupture of membranes symptoms or regular contractions. Patient is without complaints. All questions were answered.  Orders Placed This Encounter  Procedures  . POCT urinalysis dipstick    Plan:  Continued routine obstetrical care, Breech otherwise good growth and fluid excellent Doppler flow  Return in about 2 weeks (around 04/30/2015) for LROB.

## 2015-04-16 NOTE — Progress Notes (Signed)
[place] 32+3wks,breech,post pl gr 2,cx 4cm,fhr 131 bpm,afi 16cm,BPP 8/8,RI .66,.61,efw 2107g 56%

## 2015-04-28 ENCOUNTER — Emergency Department (HOSPITAL_COMMUNITY)
Admission: EM | Admit: 2015-04-28 | Discharge: 2015-04-28 | Disposition: A | Discharge status: Home / Self Care | Payer: 59 | Attending: Emergency Medicine | Admitting: Emergency Medicine

## 2015-04-28 ENCOUNTER — Encounter (HOSPITAL_COMMUNITY): Payer: Self-pay | Admitting: Emergency Medicine

## 2015-04-28 DIAGNOSIS — Z8742 Personal history of other diseases of the female genital tract: Secondary | ICD-10-CM | POA: Insufficient documentation

## 2015-04-28 DIAGNOSIS — K0889 Other specified disorders of teeth and supporting structures: Secondary | ICD-10-CM | POA: Diagnosis not present

## 2015-04-28 DIAGNOSIS — Z79899 Other long term (current) drug therapy: Secondary | ICD-10-CM | POA: Insufficient documentation

## 2015-04-28 DIAGNOSIS — Z8679 Personal history of other diseases of the circulatory system: Secondary | ICD-10-CM | POA: Diagnosis not present

## 2015-04-28 DIAGNOSIS — K029 Dental caries, unspecified: Secondary | ICD-10-CM | POA: Diagnosis not present

## 2015-04-28 DIAGNOSIS — R51 Headache: Secondary | ICD-10-CM | POA: Diagnosis not present

## 2015-04-28 MED ORDER — AMOXICILLIN 500 MG PO CAPS: 500.0000 mg | ORAL_CAPSULE | Freq: Three times a day (TID) | ORAL | Status: DC

## 2015-04-28 MED ORDER — AMOXICILLIN 250 MG PO CAPS
500.0000 mg | ORAL_CAPSULE | Freq: Once | ORAL | Status: AC
  Administered 2015-04-28: 500 mg via ORAL
  Filled 2015-04-28: qty 2

## 2015-04-28 MED ORDER — ACETAMINOPHEN 500 MG PO TABS
1000.0000 mg | ORAL_TABLET | Freq: Once | ORAL | Status: AC
  Administered 2015-04-28: 1000 mg via ORAL
  Filled 2015-04-28: qty 2

## 2015-04-28 NOTE — Discharge Instructions (Signed)
Regular and Orajel to the affected area may be helpful. Please use Tylenol every 4 hours for pain. Use Amoxil 3 times daily with food. Please consult with your OB/GYN physician for additional suggestions on pain management during this stage of your pregnancy.

## 2015-04-28 NOTE — ED Notes (Signed)
Pt reports LT sided dental pain x 2 days. Denies n/v/d. Denies fever/chills. Airway patent.

## 2015-04-28 NOTE — ED Provider Notes (Signed)
CSN: [id number]     Arrival date & time 04/28/15  1749 History   First MD Initiated Contact with Patient 04/28/15 1814     Chief Complaint  Patient presents with  . Dental Pain     (Consider location/radiation/quality/duration/timing/severity/associated sxs/prior Treatment) Patient is a 39 y.o. female presenting with tooth pain. The history is provided by the patient.  Dental Pain Quality:  Throbbing Severity:  Moderate Onset quality:  Gradual Duration:  2 days Timing:  Intermittent Progression:  Worsening Chronicity:  New Context: dental caries   Relieved by:  Nothing Associated symptoms: headaches   Associated symptoms: no fever and no trismus   Risk factors: lack of dental care   Risk factors: no smoking     Past Medical History  Diagnosis Date  . Migraine   . Pregnant 11/12/2014  . AMA (advanced maternal age) multigravida 35+ 11/12/2014  . Round ligament pain 11/12/2014  . Short of breath on exertion 11/12/2014   Past Surgical History  Procedure Laterality Date  . Cholecystectomy     Family History  Problem Relation Age of Onset  . Diabetes Mother   . Hypertension Mother   . Kidney disease Mother   . Alzheimer's disease Father   . Kidney disease Sister   . Asthma Son   . Diabetes Sister   . Asthma Son   . Heart murmur Son    Social History  Substance Use Topics  . Smoking status: Never Smoker   . Smokeless tobacco: Never Used  . Alcohol Use: No   OB History    Gravida Para Term Preterm AB TAB SAB Ectopic Multiple Living   10 6 6  0 3  1   6      Review of Systems  Constitutional: Negative for fever.  HENT: Positive for dental problem.   Neurological: Positive for headaches.  All other systems reviewed and are negative.     Allergies  Review of patient's allergies indicates no known allergies.  Home Medications   Prior to Admission medications   Medication Sig Start Date End Date Taking? Authorizing Provider  Iron-FA-B Cmp-C-Biot-Probiotic  (FUSION PLUS) CAPS Take 1 capsule by mouth See admin instructions. 1 time daily between meals 12/08/14   Roma Schanz, CNM  prenatal vitamin w/FE, FA (PRENATAL 1 + 1) 27-1 MG TABS tablet Take 1 tablet by mouth daily at 12 noon. 11/12/14   Estill Dooms, NP  promethazine (PHENERGAN) 25 MG tablet Take 1 tablet (25 mg total) by mouth every 6 (six) hours as needed for nausea or vomiting. Patient not taking: Reported on 04/16/2015 04/07/15   Cecille Rubin A Clemmons, CNM   BP 116/67 mmHg  Pulse 94  Temp(Src) 98.2 F (36.8 C) (Oral)  Resp 20  Ht 5\' 6"  (1.676 m)  Wt 92.534 kg  BMI 32.94 kg/m2  SpO2 100%  LMP 09/01/2014 Physical Exam  Constitutional: She is oriented to person, place, and time. She appears well-developed and well-nourished.  Non-toxic appearance.  HENT:  Head: Normocephalic.  Right Ear: Tympanic membrane and external ear normal.  Left Ear: Tympanic membrane and external ear normal.  Dental pain to percussion of the left lower premolar. No abscess. No swelling under the tongue. No trismus.  Eyes: EOM and lids are normal. Pupils are equal, round, and reactive to light.  Neck: Normal range of motion. Neck supple. Carotid bruit is not present.  Cardiovascular: Normal rate, regular rhythm, normal heart sounds, intact distal pulses and normal pulses.   Pulmonary/Chest: Breath sounds  normal. No respiratory distress.  Abdominal: Soft. Bowel sounds are normal. There is no tenderness. There is no guarding.  Musculoskeletal: Normal range of motion.  Lymphadenopathy:       Head (right side): No submandibular adenopathy present.       Head (left side): No submandibular adenopathy present.    She has no cervical adenopathy.  Neurological: She is alert and oriented to person, place, and time. She has normal strength. No cranial nerve deficit or sensory deficit.  Skin: Skin is warm and dry.  Psychiatric: She has a normal mood and affect. Her speech is normal.  Nursing note and vitals  reviewed.   ED Course  Procedures (including critical care time) Labs Review Labs Reviewed - No data to display  Imaging Review No results found. I have personally reviewed and evaluated these images and lab results as part of my medical decision-making.   EKG Interpretation None      MDM  Vital signs are well within normal limits. The patient has pain to percussion over the left lower premolar. No abscess noted. The airway is patent. No evidence for Ludwig's angina. the patient is pregnant in her third trimester. Patient will be treated with Tylenol and Amoxil. I've advised the patient to contact her obstetrics and gynecology physician for additional pain management suggestions.    Final diagnoses:  None    *I have reviewed nursing notes, vital signs, and all appropriate lab and imaging results for this patient.[address], PA-C 04/28/15 1838  Fredia Sorrow, MD 04/30/15 1531

## 2015-04-30 ENCOUNTER — Ambulatory Visit (INDEPENDENT_AMBULATORY_CARE_PROVIDER_SITE_OTHER): Payer: 59 | Admitting: Obstetrics and Gynecology

## 2015-04-30 ENCOUNTER — Encounter: Payer: Self-pay | Admitting: Obstetrics and Gynecology

## 2015-04-30 VITALS — BP 122/80 | HR 81 | Wt 206.0 lb

## 2015-04-30 DIAGNOSIS — Z3493 Encounter for supervision of normal pregnancy, unspecified, third trimester: Secondary | ICD-10-CM

## 2015-04-30 DIAGNOSIS — Z331 Pregnant state, incidental: Secondary | ICD-10-CM

## 2015-04-30 DIAGNOSIS — Z641 Problems related to multiparity: Secondary | ICD-10-CM

## 2015-04-30 DIAGNOSIS — Z3A34 34 weeks gestation of pregnancy: Secondary | ICD-10-CM

## 2015-04-30 DIAGNOSIS — Z1389 Encounter for screening for other disorder: Secondary | ICD-10-CM

## 2015-04-30 DIAGNOSIS — O09523 Supervision of elderly multigravida, third trimester: Secondary | ICD-10-CM

## 2015-04-30 LAB — POCT URINALYSIS DIPSTICK
GLUCOSE UA: NEGATIVE
Ketones, UA: NEGATIVE
Leukocytes, UA: NEGATIVE
NITRITE UA: NEGATIVE
RBC UA: NEGATIVE

## 2015-04-30 NOTE — Progress Notes (Signed)
Patient ID: Joyce [name], female   DOB: Mar 15, 1976, 39 y.o.   MRN: [id number]  [id number] [redacted]w[redacted]d Estimated Date of Delivery: 06/08/15  Blood pressure 122/80, pulse 81, weight 206 lb (93.441 kg), last menstrual period 09/01/2014.   refer to the ob flow sheet for FH and FHR, also BP, Wt, Urine results:notable for neg protein  Patient reports  + good fetal movement, denies any bleeding and no rupture of membranes symptoms or regular contractions. Patient complaints: She has no complaints today. She is currently taking amoxicillin due to a tooth infection. Baby was breech, will check u/s  FH: 35cm FHR: 128 Edema: none Vertex presentation  Questions were answered.  Assessment: [id number] [redacted]w[redacted]d Advanced maternal age [place] multiparity Vertex presentation  Plan:  Continued routine obstetrical care, F/u in 1 week for LROB  By signing my name below, I, Erling Conte, attest that this documentation has been prepared under the direction and in the presence of Jonnie Kind, MD. Electronically Signed: Erling Conte, ED Scribe. 04/30/2015. 11:08 AM.  I personally performed the services described in this documentation, which was SCRIBED in my presence. The recorded information has been reviewed and considered accurate. It has been edited as necessary during review. Jonnie Kind, MD '  .

## 2015-04-30 NOTE — Progress Notes (Signed)
Pt denies any problems or concerns at this time.  

## 2015-05-15 ENCOUNTER — Encounter: Payer: Self-pay | Admitting: Obstetrics and Gynecology

## 2015-05-15 ENCOUNTER — Encounter: Payer: 59 | Admitting: Obstetrics and Gynecology

## 2015-05-15 ENCOUNTER — Ambulatory Visit (INDEPENDENT_AMBULATORY_CARE_PROVIDER_SITE_OTHER): Payer: 59 | Admitting: Obstetrics and Gynecology

## 2015-05-15 VITALS — BP 120/82 | HR 70

## 2015-05-15 DIAGNOSIS — Z3493 Encounter for supervision of normal pregnancy, unspecified, third trimester: Secondary | ICD-10-CM

## 2015-05-15 DIAGNOSIS — O09529 Supervision of elderly multigravida, unspecified trimester: Secondary | ICD-10-CM

## 2015-05-15 DIAGNOSIS — Z331 Pregnant state, incidental: Secondary | ICD-10-CM

## 2015-05-15 DIAGNOSIS — Z1389 Encounter for screening for other disorder: Secondary | ICD-10-CM

## 2015-05-15 DIAGNOSIS — Z3A36 36 weeks gestation of pregnancy: Secondary | ICD-10-CM

## 2015-05-15 DIAGNOSIS — Z369 Encounter for antenatal screening, unspecified: Secondary | ICD-10-CM

## 2015-05-15 LAB — POCT URINALYSIS DIPSTICK
Glucose, UA: NEGATIVE
KETONES UA: NEGATIVE
LEUKOCYTES UA: NEGATIVE
Nitrite, UA: NEGATIVE
Protein, UA: NEGATIVE
RBC UA: NEGATIVE

## 2015-05-15 NOTE — Progress Notes (Signed)
Pt denies any problems or concerns at this time.  

## 2015-05-15 NOTE — Progress Notes (Signed)
Patient ID: Joyce [name], female   DOB: 04-Jan-1976, 39 y.o.   MRN: [id number] [redacted]w[redacted]d Estimated Date of Delivery: 06/08/15  Blood pressure 120/82, pulse 70, last menstrual period 09/01/2014.   refer to the ob flow sheet for FH and FHR, also BP, Wt, Urine results: negative  FHR: 114 FH: 36cm  Patient reports  + good fetal movement, denies any bleeding and no rupture of membranes symptoms or regular contractions. Patient complaints: Pt has no complaints at this time. Pt notes her chronic SOB at baseline remains unchanged. Pt reports she wishes to have a total sterilization after the birth of her baby.   Questions were answered. Assessment:  1. LROB [id number] @ [redacted]w[redacted]d  2. Sample collected for Group B streptococcus testing 3. Sterilization discussed   Plan:   1. Continued routine obstetrical care 2. F/u in 1 weeks for routine obstetrical care.    By signing my name below, I, Terressa Koyanagi, attest that this documentation has been prepared under the direction and in the presence of Mallory Shirk, MD. Electronically Signed: Terressa Koyanagi, ED Scribe. 05/15/2015. 11:35 AM.   I personally performed the services described in this documentation, which was SCRIBED in my presence. The recorded information has been reviewed and considered accurate. It has been edited as necessary during review. Jonnie Kind, MD

## 2015-05-16 LAB — GC/CHLAMYDIA PROBE AMP
CHLAMYDIA, DNA PROBE: NEGATIVE
Neisseria gonorrhoeae by PCR: NEGATIVE

## 2015-05-17 LAB — STREP GP B NAA: STREP GROUP B AG: NEGATIVE

## 2015-05-18 ENCOUNTER — Telehealth: Payer: Self-pay | Admitting: *Deleted

## 2015-05-18 NOTE — Telephone Encounter (Signed)
Debbora Dus, Healthy Pregnancy nurse from [facility], called informing Joyce [name] that she spoke with the pt this morning and that the pt is depressed. [name] stated that the pt sounded very depressed and that the pt told her her appetite was not good and that her husband left her when he found out she was pregnant with their 7th child. I advised [name] that I would contact the pt and encourage her to come into the office today.

## 2015-05-18 NOTE — Telephone Encounter (Signed)
I contacted the pt and asked her to come into the office today to be seen, but the pt stated that she had to work today, if it had been earlier she would have been able to come, I then asked how far from the office the pt lives and she stated a little ways away, after looking at her demographics her address [place] actually on [place]. [place] [address]. The phone call was switched to the front office and an appointment was given for first thing in the morning to discuss her depression.

## 2015-05-19 ENCOUNTER — Encounter: Payer: 59 | Admitting: Women's Health

## 2015-05-22 ENCOUNTER — Encounter: Payer: Self-pay | Admitting: Obstetrics & Gynecology

## 2015-05-22 ENCOUNTER — Encounter: Payer: 59 | Admitting: Obstetrics & Gynecology

## 2015-05-22 ENCOUNTER — Ambulatory Visit (INDEPENDENT_AMBULATORY_CARE_PROVIDER_SITE_OTHER): Payer: 59 | Admitting: Obstetrics & Gynecology

## 2015-05-22 VITALS — BP 120/80 | HR 71 | Wt 204.0 lb

## 2015-05-22 DIAGNOSIS — Z3483 Encounter for supervision of other normal pregnancy, third trimester: Secondary | ICD-10-CM

## 2015-05-22 DIAGNOSIS — Z3A38 38 weeks gestation of pregnancy: Secondary | ICD-10-CM | POA: Diagnosis not present

## 2015-05-22 DIAGNOSIS — Z331 Pregnant state, incidental: Secondary | ICD-10-CM

## 2015-05-22 DIAGNOSIS — Z3493 Encounter for supervision of normal pregnancy, unspecified, third trimester: Secondary | ICD-10-CM

## 2015-05-22 DIAGNOSIS — Z1389 Encounter for screening for other disorder: Secondary | ICD-10-CM

## 2015-05-22 LAB — POCT URINALYSIS DIPSTICK
Blood, UA: NEGATIVE
GLUCOSE UA: NEGATIVE
Ketones, UA: NEGATIVE
LEUKOCYTES UA: NEGATIVE
Nitrite, UA: NEGATIVE
Protein, UA: NEGATIVE

## 2015-05-22 NOTE — Progress Notes (Signed)
[id number] [redacted]w[redacted]d Estimated Date of Delivery: 06/08/15  Blood pressure 120/80, pulse 71, weight 204 lb (92.534 kg), last menstrual period 09/01/2014.   BP weight and urine results all reviewed and noted.  Please refer to the obstetrical flow sheet for the fundal height and fetal heart rate documentation:  Patient reports good fetal movement, denies any bleeding and no rupture of membranes symptoms or regular contractions. Patient is without complaints. All questions were answered.  Orders Placed This Encounter  Procedures  . POCT urinalysis dipstick    Plan:  Continued routine obstetrical care,   No Follow-up on file.

## 2015-05-25 ENCOUNTER — Encounter (HOSPITAL_COMMUNITY): Payer: Self-pay | Admitting: *Deleted

## 2015-05-25 ENCOUNTER — Inpatient Hospital Stay (HOSPITAL_COMMUNITY)
Admission: AD | Admit: 2015-05-25 | Discharge: 2015-05-25 | Disposition: A | Discharge status: Home / Self Care | Payer: 59 | Source: Ambulatory Visit | Attending: Obstetrics and Gynecology | Admitting: Obstetrics and Gynecology

## 2015-05-25 DIAGNOSIS — Z3493 Encounter for supervision of normal pregnancy, unspecified, third trimester: Secondary | ICD-10-CM

## 2015-05-25 HISTORY — DX: Trichomoniasis, unspecified: A59.9

## 2015-05-25 NOTE — MAU Note (Signed)
Contractions and spotting since yesterday. Today, UC's more regular.

## 2015-05-25 NOTE — Discharge Instructions (Signed)
Keep your scheduled appt for prenatal care. Drink 8-10 glasses of water per day. Call the office or provider on call with further concerns or return to MAU as needed.

## 2015-05-26 ENCOUNTER — Inpatient Hospital Stay (HOSPITAL_COMMUNITY): Payer: 59 | Admitting: Anesthesiology

## 2015-05-26 ENCOUNTER — Inpatient Hospital Stay (HOSPITAL_COMMUNITY)
Admission: AD | Admit: 2015-05-26 | Discharge: 2015-05-28 | DRG: 775 | Disposition: A | Discharge status: Home / Self Care | Payer: 59 | Source: Ambulatory Visit | Attending: Obstetrics and Gynecology | Admitting: Obstetrics and Gynecology

## 2015-05-26 ENCOUNTER — Encounter (HOSPITAL_COMMUNITY): Payer: Self-pay | Admitting: *Deleted

## 2015-05-26 DIAGNOSIS — Z2839 Other underimmunization status: Secondary | ICD-10-CM

## 2015-05-26 DIAGNOSIS — Z3490 Encounter for supervision of normal pregnancy, unspecified, unspecified trimester: Secondary | ICD-10-CM

## 2015-05-26 DIAGNOSIS — R87810 Cervical high risk human papillomavirus (HPV) DNA test positive: Secondary | ICD-10-CM | POA: Diagnosis present

## 2015-05-26 DIAGNOSIS — Z3483 Encounter for supervision of other normal pregnancy, third trimester: Secondary | ICD-10-CM | POA: Diagnosis present

## 2015-05-26 DIAGNOSIS — Z3A38 38 weeks gestation of pregnancy: Secondary | ICD-10-CM

## 2015-05-26 DIAGNOSIS — Z23 Encounter for immunization: Secondary | ICD-10-CM

## 2015-05-26 DIAGNOSIS — Z641 Problems related to multiparity: Secondary | ICD-10-CM

## 2015-05-26 DIAGNOSIS — O4290 Premature rupture of membranes, unspecified as to length of time between rupture and onset of labor, unspecified weeks of gestation: Secondary | ICD-10-CM | POA: Diagnosis present

## 2015-05-26 DIAGNOSIS — Z283 Underimmunization status: Secondary | ICD-10-CM

## 2015-05-26 DIAGNOSIS — O09529 Supervision of elderly multigravida, unspecified trimester: Secondary | ICD-10-CM

## 2015-05-26 DIAGNOSIS — O9989 Other specified diseases and conditions complicating pregnancy, childbirth and the puerperium: Secondary | ICD-10-CM

## 2015-05-26 DIAGNOSIS — O4292 Full-term premature rupture of membranes, unspecified as to length of time between rupture and onset of labor: Secondary | ICD-10-CM | POA: Diagnosis present

## 2015-05-26 LAB — CBC
HCT: 39.4 % (ref 36.0–46.0)
Hemoglobin: 13.5 g/dL (ref 12.0–15.0)
MCH: 27.4 pg (ref 26.0–34.0)
MCHC: 34.3 g/dL (ref 30.0–36.0)
MCV: 79.9 fL (ref 78.0–100.0)
PLATELETS: 195 10*3/uL (ref 150–400)
RBC: 4.93 MIL/uL (ref 3.87–5.11)
RDW: 14.3 % (ref 11.5–15.5)
WBC: 12 10*3/uL — AB (ref 4.0–10.5)

## 2015-05-26 LAB — TYPE AND SCREEN
ABO/RH(D): A POS
Antibody Screen: NEGATIVE

## 2015-05-26 LAB — POCT FERN TEST: POCT Fern Test: POSITIVE

## 2015-05-26 LAB — ABO/RH: ABO/RH(D): A POS

## 2015-05-26 LAB — RPR: RPR Ser Ql: NONREACTIVE

## 2015-05-26 MED ORDER — EPHEDRINE 5 MG/ML INJ
10.0000 mg | INTRAVENOUS | Status: DC | PRN
  Filled 2015-05-26: qty 2

## 2015-05-26 MED ORDER — SIMETHICONE 80 MG PO CHEW
80.0000 mg | CHEWABLE_TABLET | ORAL | Status: DC | PRN
Start: 2015-05-26 — End: 2015-05-28

## 2015-05-26 MED ORDER — OXYTOCIN 40 UNITS IN LACTATED RINGERS INFUSION - SIMPLE MED
62.5000 mL/h | INTRAVENOUS | Status: DC
  Filled 2015-05-26: qty 1000

## 2015-05-26 MED ORDER — DIBUCAINE 1 % RE OINT: 1.0000 "application " | TOPICAL_OINTMENT | RECTAL | Status: DC | PRN

## 2015-05-26 MED ORDER — ZOLPIDEM TARTRATE 5 MG PO TABS: 5.0000 mg | ORAL_TABLET | Freq: Every evening | ORAL | Status: DC | PRN

## 2015-05-26 MED ORDER — OXYTOCIN 40 UNITS IN LACTATED RINGERS INFUSION - SIMPLE MED
1.0000 m[IU]/min | INTRAVENOUS | Status: DC
  Administered 2015-05-26: 1 m[IU]/min via INTRAVENOUS

## 2015-05-26 MED ORDER — PRENATAL MULTIVITAMIN CH
1.0000 | ORAL_TABLET | Freq: Every day | ORAL | Status: DC
  Administered 2015-05-27 – 2015-05-28 (×2): 1 via ORAL
  Filled 2015-05-26 (×2): qty 1

## 2015-05-26 MED ORDER — LACTATED RINGERS IV SOLN
INTRAVENOUS | Status: DC
  Administered 2015-05-26 (×2): via INTRAVENOUS

## 2015-05-26 MED ORDER — TERBUTALINE SULFATE 1 MG/ML IJ SOLN
0.2500 mg | Freq: Once | INTRAMUSCULAR | Status: DC | PRN
Start: 2015-05-26 — End: 2015-05-26
  Filled 2015-05-26: qty 1

## 2015-05-26 MED ORDER — ONDANSETRON HCL 4 MG PO TABS: 4.0000 mg | ORAL_TABLET | ORAL | Status: DC | PRN

## 2015-05-26 MED ORDER — BENZOCAINE-MENTHOL 20-0.5 % EX AERO
1.0000 "application " | INHALATION_SPRAY | CUTANEOUS | Status: DC | PRN
  Administered 2015-05-27: 1 via TOPICAL
  Filled 2015-05-26: qty 56

## 2015-05-26 MED ORDER — ACETAMINOPHEN 325 MG PO TABS: 650.0000 mg | ORAL_TABLET | ORAL | Status: DC | PRN

## 2015-05-26 MED ORDER — NALBUPHINE HCL 10 MG/ML IJ SOLN
10.0000 mg | INTRAMUSCULAR | Status: DC | PRN
  Administered 2015-05-26: 10 mg via INTRAVENOUS
  Filled 2015-05-26: qty 1

## 2015-05-26 MED ORDER — PHENYLEPHRINE 40 MCG/ML (10ML) SYRINGE FOR IV PUSH (FOR BLOOD PRESSURE SUPPORT)
80.0000 ug | PREFILLED_SYRINGE | INTRAVENOUS | Status: DC | PRN
  Filled 2015-05-26: qty 2

## 2015-05-26 MED ORDER — SENNOSIDES-DOCUSATE SODIUM 8.6-50 MG PO TABS
2.0000 | ORAL_TABLET | ORAL | Status: DC
  Administered 2015-05-26 – 2015-05-28 (×2): 2 via ORAL
  Filled 2015-05-26 (×2): qty 2

## 2015-05-26 MED ORDER — OXYCODONE-ACETAMINOPHEN 5-325 MG PO TABS: 1.0000 | ORAL_TABLET | ORAL | Status: DC | PRN

## 2015-05-26 MED ORDER — DIPHENHYDRAMINE HCL 50 MG/ML IJ SOLN: 12.5000 mg | INTRAMUSCULAR | Status: DC | PRN

## 2015-05-26 MED ORDER — MEASLES, MUMPS & RUBELLA VAC ~~LOC~~ INJ
0.5000 mL | INJECTION | Freq: Once | SUBCUTANEOUS | Status: AC
  Administered 2015-05-28: 0.5 mL via SUBCUTANEOUS
  Filled 2015-05-26: qty 0.5

## 2015-05-26 MED ORDER — LANOLIN HYDROUS EX OINT: TOPICAL_OINTMENT | CUTANEOUS | Status: DC | PRN

## 2015-05-26 MED ORDER — OXYTOCIN BOLUS FROM INFUSION: 500.0000 mL | INTRAVENOUS | Status: DC

## 2015-05-26 MED ORDER — PHENYLEPHRINE 40 MCG/ML (10ML) SYRINGE FOR IV PUSH (FOR BLOOD PRESSURE SUPPORT)
PREFILLED_SYRINGE | INTRAVENOUS | Status: AC
  Filled 2015-05-26: qty 20

## 2015-05-26 MED ORDER — OXYCODONE-ACETAMINOPHEN 5-325 MG PO TABS: 2.0000 | ORAL_TABLET | ORAL | Status: DC | PRN

## 2015-05-26 MED ORDER — ONDANSETRON HCL 4 MG/2ML IJ SOLN: 4.0000 mg | Freq: Four times a day (QID) | INTRAMUSCULAR | Status: DC | PRN

## 2015-05-26 MED ORDER — FENTANYL 2.5 MCG/ML BUPIVACAINE 1/10 % EPIDURAL INFUSION (WH - ANES)
14.0000 mL/h | INTRAMUSCULAR | Status: DC | PRN
  Administered 2015-05-26: 14 mL/h via EPIDURAL

## 2015-05-26 MED ORDER — LACTATED RINGERS IV SOLN
500.0000 mL | INTRAVENOUS | Status: DC | PRN
  Administered 2015-05-26 (×2): 500 mL via INTRAVENOUS

## 2015-05-26 MED ORDER — TETANUS-DIPHTH-ACELL PERTUSSIS 5-2.5-18.5 LF-MCG/0.5 IM SUSP
0.5000 mL | Freq: Once | INTRAMUSCULAR | Status: AC
  Administered 2015-05-26: 0.5 mL via INTRAMUSCULAR
  Filled 2015-05-26: qty 0.5

## 2015-05-26 MED ORDER — WITCH HAZEL-GLYCERIN EX PADS: 1.0000 "application " | MEDICATED_PAD | CUTANEOUS | Status: DC | PRN

## 2015-05-26 MED ORDER — DIPHENHYDRAMINE HCL 25 MG PO CAPS: 25.0000 mg | ORAL_CAPSULE | Freq: Four times a day (QID) | ORAL | Status: DC | PRN

## 2015-05-26 MED ORDER — LIDOCAINE HCL (PF) 1 % IJ SOLN
30.0000 mL | INTRAMUSCULAR | Status: DC | PRN
  Filled 2015-05-26: qty 30

## 2015-05-26 MED ORDER — CITRIC ACID-SODIUM CITRATE 334-500 MG/5ML PO SOLN: 30.0000 mL | ORAL | Status: DC | PRN

## 2015-05-26 MED ORDER — IBUPROFEN 600 MG PO TABS
600.0000 mg | ORAL_TABLET | Freq: Four times a day (QID) | ORAL | Status: DC
  Administered 2015-05-26 – 2015-05-28 (×8): 600 mg via ORAL
  Filled 2015-05-26 (×8): qty 1

## 2015-05-26 MED ORDER — LIDOCAINE HCL (PF) 1 % IJ SOLN
INTRAMUSCULAR | Status: DC | PRN
  Administered 2015-05-26 (×2): 8 mL via EPIDURAL

## 2015-05-26 MED ORDER — ONDANSETRON HCL 4 MG/2ML IJ SOLN
4.0000 mg | INTRAMUSCULAR | Status: DC | PRN
Start: 2015-05-26 — End: 2015-05-28

## 2015-05-26 MED ORDER — FENTANYL 2.5 MCG/ML BUPIVACAINE 1/10 % EPIDURAL INFUSION (WH - ANES)
INTRAMUSCULAR | Status: AC
  Filled 2015-05-26: qty 125

## 2015-05-26 NOTE — Lactation Note (Addendum)
This note was copied from the chart of Joyce [name]. Lactation Consultation Note  Assisted this experienced BF with attaching the baby using a NS. When baby detached there was colostrum in the shield.   Mom has inverted nipples and has previously used shells to help evert the nipple.  The baby was trying to attach and could not grasp the breast so the NS was started.  Mother liked the nipple shield.  Baby is 110 hours old so she was informed that for now she could use the hand pump post feeding to stimulate the MS.  Follow-up planned for tomorrow. Information given on support groups and outpatient services. Patient Name: Joyce [name] [id number] Date: 05/26/2015 Reason for consult: Initial assessment   Maternal Data Does the patient have breastfeeding experience prior to this delivery?: Yes  Feeding Feeding Type: Breast Fed Length of feed: 10 min  LATCH Score/Interventions Latch: Repeated attempts needed to sustain latch, nipple held in mouth throughout feeding, stimulation needed to elicit sucking reflex.  Audible Swallowing: A few with stimulation  Type of Nipple: Inverted (Nipple shield) Intervention(s): Hand pump  Comfort (Breast/Nipple): Soft / non-tender     Hold (Positioning): Assistance needed to correctly position infant at breast and maintain latch.  LATCH Score: 5  Lactation Tools Discussed/Used Tools: Nipple Shields   Consult Status Consult Status: Follow-up Date: 05/27/15 Follow-up type: In-patient    Van Clines 05/26/2015, 4:56 PM

## 2015-05-26 NOTE — Anesthesia Preprocedure Evaluation (Signed)
Anesthesia Evaluation  Patient identified by MRN, date of birth, ID band Patient awake    Reviewed: Allergy & Precautions, H&P , NPO status , Patient's Chart, lab work & pertinent test results  Airway Mallampati: I  TM Distance: >3 FB Neck ROM: full    Dental no notable dental hx.    Pulmonary neg pulmonary ROS,    Pulmonary exam normal        Cardiovascular negative cardio ROS Normal cardiovascular exam     Neuro/Psych negative psych ROS   GI/Hepatic negative GI ROS, Neg liver ROS,   Endo/Other  negative endocrine ROS  Renal/GU negative Renal ROS     Musculoskeletal   Abdominal (+) + obese,   Peds  Hematology negative hematology ROS (+)   Anesthesia Other Findings   Reproductive/Obstetrics (+) Pregnancy                             Anesthesia Physical Anesthesia Plan  ASA: II  Anesthesia Plan: Epidural   Post-op Pain Management:    Induction:   Airway Management Planned:   Additional Equipment:   Intra-op Plan:   Post-operative Plan:   Informed Consent: I have reviewed the patients History and Physical, chart, labs and discussed the procedure including the risks, benefits and alternatives for the proposed anesthesia with the patient or authorized representative who has indicated his/her understanding and acceptance.     Plan Discussed with:   Anesthesia Plan Comments:         Anesthesia Quick Evaluation

## 2015-05-26 NOTE — MAU Note (Signed)
Pt reports ROM 2330, clear fluid.

## 2015-05-26 NOTE — H&P (Signed)
LABOR ADMISSION HISTORY AND PHYSICAL  Joyce [name] is a 39 y.o. female [phone] with IUP at 75w1dpresenting for SROM @ 11:30 PM. She reports +FM, No LOF, no VB, no blurry vision, or peripheral edema, and RUQ pain.  She plans on Breast and Bottle feeding. She request  BTL for birth control. Patient indicates having headaches, has a hx of migraines.   Dating: By LMP and 10 wk u/s--->  Estimated Date of Delivery: 06/08/15  Sono:    '@[redacted]w[redacted]d' , CWD, normal anatomy, cephalic presentation, 20814g, 56 % EFW  Prenatal History/Complications:  [place] [facility]  [facility] Dating By LMP c/w 10wk u/s  Pap 11/26/14: neg w/ +HRHPV   Repeat 127yrGC/CT Initial:   -/-             36+wks:  Genetic Screen NT/IT: normal  CF screen neg  Anatomic [place]  Flu vaccine declined  Tdap Recommended ~ 28wks  Glucose Screen  2 hr  81/113/85  GBS gbs neg  Feed Preference Breast and bottle  Contraception   [facility]umc[facility] Yes at [facility]   Past Medical History: Past Medical History  Diagnosis Date  . Migraine   . Pregnant 11/12/2014  . AMA (advanced maternal age) multigravida 35+ 11/12/2014  . Round ligament pain 11/12/2014  . Short of breath on exertion 11/12/2014  . Trichomonas infection     Past Surgical History: Past Surgical History  Procedure Laterality Date  . Cholecystectomy      Obstetrical History: OB History    Gravida Para Term Preterm AB TAB SAB Ectopic Multiple Living   '10 6 6 ' 0 '3  1   6      ' Social History: Social History   Social History  . Marital Status: Single    Spouse Name: N/A  . Number of Children: N/A  . Years of Education: N/A   Social History Main Topics  . Smoking status: Never Smoker   . Smokeless tobacco: Never Used  . Alcohol Use: No  . Drug Use: No  . Sexual Activity: Yes    Birth Control/ Protection: None    Other Topics Concern  . None   Social History Narrative    Family History: Family History  Problem Relation Age of Onset  . Diabetes Mother   . Hypertension Mother   . Kidney disease Mother   . Alzheimer's disease Father   . Kidney disease Sister   . Asthma Son   . Diabetes Sister   . Asthma Son   . Heart murmur Son     Allergies: No Known Allergies  Prescriptions prior to admission  Medication Sig Dispense Refill Last Dose  . acetaminophen (TYLENOL) 325 MG tablet Take 650 mg by mouth every 6 (six) hours as needed for headache.   Past Week at Unknown time  . Iron-FA-B Cmp-C-Biot-Probiotic (FUSION PLUS) CAPS Take 1 capsule by mouth See admin instructions. 1 time daily between meals 30 capsule 6 05/25/2015 at Unknown time  . prenatal vitamin w/FE, FA (PRENATAL 1 + 1) 27-1 MG TABS tablet Take 1 tablet by mouth daily at 12 noon. 30 each 11 05/25/2015 at Unknown time     Review of Systems   All systems reviewed and negative except as stated in HPI  Pulse 70  Temp(Src) 98.3 F (36.8 C) (Oral)  Resp  16  Ht '5\' 6"'  (1.676 m)  Wt 198 lb (89.812 kg)  BMI 31.97 kg/m2  SpO2 100%  LMP 09/01/2014 General appearance: alert and cooperative Lungs: clear to auscultation bilaterally Heart: regular rate and rhythm Abdomen: soft, non-tender; bowel sounds normal Extremities: Homans sign is negative, no sign of DVT, edema Presentation: cephalic, by u/s Fetal monitoringBaseline: 135 bpm, Variability: Good {> 6 bpm), Accelerations: Reactive and Decelerations: Occurs rare, late deceleration in MAU  Uterine activity: Irregular contractions  Dilation: 3 Effacement (%): 50 Exam by:: Dr Emmaline Life   Prenatal labs: ABO, Rh: A/Positive/-- (06/29 4446) Antibody: Negative (10/07 0903) Rubella:Non-immune RPR: Non Reactive (10/07 0903)  HBsAg: Negative (06/29 0939)  HIV: Non Reactive (10/07 0903)  GBS: Negative (12/16 1200)  1 hr Glucola 81/113/85 Genetic screening  Normal  Anatomy US  Normal   Prenatal Transfer Tool  Maternal Diabetes: No Genetic Screening: Normal Maternal Ultrasounds/Referrals: Normal Fetal Ultrasounds or other Referrals:  None Maternal Substance Abuse:  No Significant Maternal Medications:  None Significant Maternal Lab Results: Lab values include: Group B Strep negative  Results for orders placed or performed during the hospital encounter of 05/26/15 (from the past 24 hour(s))  Fern Test   Collection Time: 05/26/15  1:49 AM  Result Value Ref Range   POCT Fern Test Positive = ruptured amniotic membanes     Patient Active Problem List   Diagnosis Date Noted  . Rubella non-immune status, antepartum 12/08/2014  . 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 [place] multiparity 11/26/2014  . Trichomonas infection 11/26/2014  . Supervision of normal pregnancy 11/12/2014  . AMA (advanced maternal age) multigravida 35+ 11/12/2014  . Round ligament pain 11/12/2014  . Short of breath on exertion 11/12/2014    Assessment: Joyce [name] is a 39 y.o. [id number] at 66w1dhere for SROM.   #Labor: Watch FHT, then consider starting  Pitocin  #Pain: Epidural  #FWB: Category 2 #ID:  GBS negative  #MOF: Breast  #MOC: BTL #Circ:  Outpt circ  # Rubella Non-immune : MMR shot after delivery   AKerrin Mo MD

## 2015-05-26 NOTE — Anesthesia Postprocedure Evaluation (Signed)
Anesthesia Post Note  Patient: Joyce [name]  Procedure(s) Performed: * No procedures listed *  Patient location during evaluation: Mother Baby Anesthesia Type: Epidural Level of consciousness: awake and alert and oriented Pain management: satisfactory to patient Vital Signs Assessment: post-procedure vital signs reviewed and stable Respiratory status: spontaneous breathing Cardiovascular status: blood pressure returned to baseline Postop Assessment: no headache, no backache, patient able to bend at knees, no signs of nausea or vomiting and adequate PO intake Anesthetic complications: no    Last Vitals:  Filed Vitals:   05/26/15 1223 05/26/15 1330  BP: 110/64 122/72  Pulse: 75 72  Temp: 36.5 C 36.7 C  Resp: 20 18    Last Pain:  Filed Vitals:   05/26/15 1720  PainSc: 0-No pain                 Hubbard Robinson

## 2015-05-26 NOTE — Anesthesia Procedure Notes (Signed)
Epidural Patient location during procedure: OB Start time: 05/26/2015 5:16 AM End time: 05/26/2015 5:20 AM  Staffing Anesthesiologist: Lyn Hollingshead  Preanesthetic Checklist Completed: patient identified, surgical consent, pre-op evaluation, timeout performed, IV checked, risks and benefits discussed and monitors and equipment checked  Epidural Patient position: sitting Prep: site prepped and draped and DuraPrep Patient monitoring: continuous pulse ox and blood pressure Approach: midline Location: L3-L4 Injection technique: LOR air  Needle:  Needle type: Tuohy  Needle gauge: 17 G Needle length: 9 cm and 9 Needle insertion depth: 7 cm Catheter type: closed end flexible Catheter size: 19 Gauge Catheter at skin depth: 12 cm Test dose: negative and Other  Assessment Sensory level: T9 Events: blood not aspirated, injection not painful, no injection resistance, negative IV test and no paresthesia  Additional Notes Reason for block:procedure for pain

## 2015-05-27 NOTE — Lactation Note (Signed)
This note was copied from the chart of Joyce [name]. Lactation Consultation Note  Patient Name: Joyce Davon Seydel [id number] Date: 05/27/2015 Reason for consult: Follow-up assessment Mom reports to [facility] that she is breast feeding for about 15 minutes before giving bottles. She does not report observing any colostrum in the nipple shield. Baby asleep at this visit so LC encouraged Mom to call with next feeding to observe latch and see if nipple shield the appropriate size. [place] left phone number to call. Mom reports she is not sure if she plans to continue to BF, but did agree to call. Mom not pumping consistently. If baby not d/c today and Mom wants to continue to BF, will set up DEBP for Mom to use.   Maternal Data    Feeding Feeding Type: Bottle Fed - Formula Nipple Type: Slow - flow  LATCH Score/Interventions                      Lactation Tools Discussed/Used Tools: Pump;Nipple Shields Breast pump type: Manual   Consult Status Consult Status: Follow-up Date: 05/27/15 Follow-up type: In-patient    Katrine Coho 05/27/2015, 8:35 AM

## 2015-05-27 NOTE — Progress Notes (Signed)
Post Partum Day 1 Subjective: no complaints, up ad lib, voiding and tolerating PO  Objective: Blood pressure 124/70, pulse 60, temperature 97.9 F (36.6 C), temperature source Oral, resp. rate 20, height 5\' 6"  (1.676 m), weight 198 lb (89.812 kg), last menstrual period 09/01/2014, SpO2 100 %, unknown if currently breastfeeding.  Physical Exam:  General: alert, cooperative and no distress Lochia: appropriate Uterine Fundus: firm Incision: healing well DVT Evaluation: No evidence of DVT seen on physical exam.   Recent Labs  05/26/15 0210  HGB 13.5  HCT 39.4    Assessment/Plan: Plan for discharge tomorrow, Breastfeeding and Circumcision prior to discharge   LOS: 1 day   [facility] 05/27/2015, 9:21 AM

## 2015-05-28 ENCOUNTER — Encounter: Payer: 59 | Admitting: Obstetrics and Gynecology

## 2015-05-28 MED ORDER — IBUPROFEN 600 MG PO TABS: 600.0000 mg | ORAL_TABLET | Freq: Four times a day (QID) | ORAL | Status: DC | PRN

## 2015-05-28 NOTE — Discharge Summary (Signed)
OB Discharge Summary     Patient Name: Joyce [name] DOB: [birth year] MRN: [id number]  Date of admission: 05/26/2015 Delivering MD: Silas [place] H   Date of discharge: 05/28/2015  Admitting diagnosis: 38 WEEKS CTX Intrauterine pregnancy: [redacted]w[redacted]d    Secondary diagnosis:  Active Problems:   Supervision of normal pregnancy   AMA (advanced maternal age) multigravida 38+   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   Rubella non-immune status, antepartum   Cervical high risk HPV (human papillomavirus) test positive   PROM (premature rupture of membranes)  Additional problems: none     Discharge diagnosis: Term Pregnancy Delivered                                                                                                Post partum procedures:none  Augmentation: Pitocin  Complications: None  Hospital course:  Onset of Labor With Vaginal Delivery     39y.o. yo [id number] 334w1das admitted w/ ROM in Latent Laboron 05/26/2015. Patient had an uncomplicated labor course as follows:  Membrane Rupture Time/Date: 11:30 PM ,05/25/2015   Intrapartum Procedures: Episiotomy: None [1]                                         Lacerations:  None [1]  Patient had a delivery of a Viable infant. 05/26/2015  Information for the patient's newborn:  GaAnniemae, Haberkorn0[[id number]]Delivery Method: Vaginal, Spontaneous Delivery (Filed from Delivery Summary)    Pateint had an uncomplicated postpartum course.  She is ambulating, tolerating a regular diet, passing flatus, and urinating well. Patient is discharged home in stable condition on 05/28/2015. Will get MMR prior to d/c.     Physical exam  Filed Vitals:   05/26/15 1801 05/27/15 0133 05/27/15 0625 05/27/15 1803  BP: 116/64 109/58 124/70 119/78  Pulse: 74 80 60 82  Temp: 98.4 F (36.9 C) 98.5 F (36.9 C) 97.9 F (36.6 C) 98.3 F (36.8 C)  TempSrc: Oral Oral Oral Oral  Resp: _0 Height:      Weight:      SpO2: 100% 100%      General: alert and cooperative Lochia: appropriate Uterine Fundus: firm Incision: N/A DVT Evaluation: No evidence of DVT seen on physical exam. Labs: Lab Results  Component Value Date   WBC 12.0* 05/26/2015   HGB 13.5 05/26/2015   HCT 39.4 05/26/2015   MCV 79.9 05/26/2015   PLT 195 05/26/2015   CMP Latest Ref Rng 04/07/2015  Glucose 65 - 99 mg/dL 87  BUN 6 - 20 mg/dL <5(L)  Creatinine 0.44 - 1.00 mg/dL 0.45  Sodium 135 - 145 mmol/L 135  Potassium 3.5 - 5.1 mmol/L 3.5  Chloride 101 - 111 mmol/L 107  CO2 22 - 32 mmol/L 20(L)  Calcium 8.9 - 10.3 mg/dL 8.9  Total Protein 6.5 - 8.1 g/dL 6.9  Total Bilirubin 0.3 - 1.2 mg/dL 0.5  Alkaline Phos 38 - 126 U/L 62  AST 15 - 41 U/L  20  ALT 14 - 54 U/L 16    Discharge instruction: per After Visit Summary and "Baby and Me Booklet".  After visit meds:    Medication List    STOP taking these medications        acetaminophen 325 MG tablet  Commonly known as:  TYLENOL      TAKE these medications        FUSION PLUS Caps  Take 1 capsule by mouth See admin instructions. 1 time daily between meals     ibuprofen 600 MG tablet  Commonly known as:  ADVIL,MOTRIN  Take 1 tablet (600 mg total) by mouth every 6 (six) hours as needed.     prenatal vitamin w/FE, FA 27-1 MG Tabs tablet  Take 1 tablet by mouth daily at 12 noon.        Diet: routine diet  Activity: Advance as tolerated. Pelvic rest for 6 weeks.   Outpatient follow up:6 weeks Follow up Appt:No future appointments. Follow up Visit:No Follow-up on file.  Postpartum contraception: desires BTL  Newborn Data: Live born female  Birth Weight: 6 lb 3.8 oz (2830 g) APGAR: 8, 9  Baby Feeding: Bottle and Breast Disposition:home with mother   05/28/2015 Serita Grammes, CNM  6:24 AM

## 2015-05-28 NOTE — Discharge Instructions (Signed)

## 2015-06-18 DIAGNOSIS — Z029 Encounter for administrative examinations, unspecified: Secondary | ICD-10-CM

## 2015-07-09 ENCOUNTER — Ambulatory Visit (INDEPENDENT_AMBULATORY_CARE_PROVIDER_SITE_OTHER): Payer: 59 | Admitting: Advanced Practice Midwife

## 2015-07-09 ENCOUNTER — Encounter: Payer: Self-pay | Admitting: Advanced Practice Midwife

## 2015-07-09 VITALS — BP 144/92 | HR 70 | Ht 66.0 in | Wt 204.0 lb

## 2015-07-09 DIAGNOSIS — Z3202 Encounter for pregnancy test, result negative: Secondary | ICD-10-CM | POA: Diagnosis not present

## 2015-07-09 LAB — POCT URINE PREGNANCY: Preg Test, Ur: NEGATIVE

## 2015-07-09 MED ORDER — NORETHINDRONE 0.35 MG PO TABS: 1.0000 | ORAL_TABLET | Freq: Every day | ORAL | Status: DC

## 2015-07-09 NOTE — Progress Notes (Signed)
Joyce [name] is a 40 y.o. who presents for a postpartum visit. She is 6 weeks postpartum following a spontaneous vaginal delivery. I have fully reviewed the prenatal and intrapartum course. The delivery was at 38.1 gestational weeks.  Anesthesia: epidural. Postpartum course has been uncomplicated. Baby's course has been uneventful. Baby is feeding by bottle. Bleeding: no bleeding. Bowel function is normal. Bladder function is normal. Patient is sexually active. Contraception method is condoms. wants OC's for 6 months, then plans BTL.  Has never been on [facility] Postpartum depression screening: positive.  [place] SI/HI.   Already has an appt to a therapist (was scheduled even before baby came).   Current outpatient prescriptions:  .  prenatal vitamin w/FE, FA (PRENATAL 1 + 1) 27-1 MG TABS tablet, Take 1 tablet by mouth daily at 12 noon., Disp: 30 each, Rfl: 11 .  norethindrone (MICRONOR,CAMILA,ERRIN) 0.35 MG tablet, Take 1 tablet (0.35 mg total) by mouth daily., Disp: 1 Package, Rfl: 11  Review of Systems   Constitutional: Negative for fever and chills Eyes: Negative for visual disturbances Respiratory: Negative for shortness of breath, dyspnea Cardiovascular: Negative for chest pain or palpitations  Gastrointestinal: Negative for vomiting, diarrhea and constipation Genitourinary: Negative for dysuria and urgency Musculoskeletal: Negative for back pain, joint pain, myalgias  Neurological: Negative for dizziness and headaches   Objective:     Filed Vitals:   07/09/15 1103  BP: 144/92  Pulse: 70   General:  alert, cooperative and no distress   Breasts:  negative  Lungs: clear to auscultation bilaterally  Heart:  regular rate and rhythm  Abdomen: Soft, nontender   Vulva:  normal  Vagina: normal vagina  Cervix:  closed  Corpus: Well involuted     Rectal Exam: no hemorrhoids        Assessment:    normal postpartum exam.  Plan:    1. Contraception: oral progesterone-only  contraceptive d/t BP being eleated today 2. Follow up in: 6 weeks for BP check--may change to COC if BP normal and having too uch BTB

## 2015-08-02 ENCOUNTER — Emergency Department (HOSPITAL_COMMUNITY): Payer: 59

## 2015-08-02 ENCOUNTER — Emergency Department (HOSPITAL_COMMUNITY)
Admission: EM | Admit: 2015-08-02 | Discharge: 2015-08-02 | Disposition: A | Discharge status: Home / Self Care | Payer: 59 | Attending: Emergency Medicine | Admitting: Emergency Medicine

## 2015-08-02 ENCOUNTER — Encounter (HOSPITAL_COMMUNITY): Payer: Self-pay | Admitting: *Deleted

## 2015-08-02 DIAGNOSIS — Z9049 Acquired absence of other specified parts of digestive tract: Secondary | ICD-10-CM | POA: Diagnosis not present

## 2015-08-02 DIAGNOSIS — E876 Hypokalemia: Secondary | ICD-10-CM | POA: Diagnosis not present

## 2015-08-02 DIAGNOSIS — R102 Pelvic and perineal pain: Secondary | ICD-10-CM | POA: Diagnosis not present

## 2015-08-02 DIAGNOSIS — R1030 Lower abdominal pain, unspecified: Secondary | ICD-10-CM | POA: Diagnosis present

## 2015-08-02 LAB — URINALYSIS, ROUTINE W REFLEX MICROSCOPIC
BILIRUBIN URINE: NEGATIVE
Glucose, UA: NEGATIVE mg/dL
KETONES UR: NEGATIVE mg/dL
Leukocytes, UA: NEGATIVE
NITRITE: NEGATIVE
PH: 6 (ref 5.0–8.0)
Protein, ur: NEGATIVE mg/dL
Specific Gravity, Urine: 1.02 (ref 1.005–1.030)

## 2015-08-02 LAB — CBC WITH DIFFERENTIAL/PLATELET
BASOS ABS: 0 10*3/uL (ref 0.0–0.1)
BASOS PCT: 0 %
EOS ABS: 0.1 10*3/uL (ref 0.0–0.7)
EOS PCT: 1 %
HCT: 44.5 % (ref 36.0–46.0)
Hemoglobin: 14.6 g/dL (ref 12.0–15.0)
Lymphocytes Relative: 30 %
Lymphs Abs: 2.9 10*3/uL (ref 0.7–4.0)
MCH: 26.7 pg (ref 26.0–34.0)
MCHC: 32.8 g/dL (ref 30.0–36.0)
MCV: 81.4 fL (ref 78.0–100.0)
MONO ABS: 0.4 10*3/uL (ref 0.1–1.0)
Monocytes Relative: 5 %
Neutro Abs: 6.4 10*3/uL (ref 1.7–7.7)
Neutrophils Relative %: 64 %
PLATELETS: 238 10*3/uL (ref 150–400)
RBC: 5.47 MIL/uL — ABNORMAL HIGH (ref 3.87–5.11)
RDW: 13.8 % (ref 11.5–15.5)
WBC: 9.8 10*3/uL (ref 4.0–10.5)

## 2015-08-02 LAB — COMPREHENSIVE METABOLIC PANEL
ALT: 16 U/L (ref 14–54)
AST: 19 U/L (ref 15–41)
Albumin: 3.5 g/dL (ref 3.5–5.0)
Alkaline Phosphatase: 48 U/L (ref 38–126)
Anion gap: 4 — ABNORMAL LOW (ref 5–15)
BILIRUBIN TOTAL: 0.5 mg/dL (ref 0.3–1.2)
BUN: 4 mg/dL — AB (ref 6–20)
CHLORIDE: 106 mmol/L (ref 101–111)
CO2: 26 mmol/L (ref 22–32)
Calcium: 8.5 mg/dL — ABNORMAL LOW (ref 8.9–10.3)
Creatinine, Ser: 0.57 mg/dL (ref 0.44–1.00)
Glucose, Bld: 91 mg/dL (ref 65–99)
POTASSIUM: 3.2 mmol/L — AB (ref 3.5–5.1)
Sodium: 136 mmol/L (ref 135–145)
TOTAL PROTEIN: 7.2 g/dL (ref 6.5–8.1)

## 2015-08-02 LAB — URINE MICROSCOPIC-ADD ON
Bacteria, UA: NONE SEEN
WBC UA: NONE SEEN WBC/hpf (ref 0–5)

## 2015-08-02 LAB — POC URINE PREG, ED: PREG TEST UR: NEGATIVE

## 2015-08-02 LAB — LIPASE, BLOOD: LIPASE: 22 U/L (ref 11–51)

## 2015-08-02 MED ORDER — KETOROLAC TROMETHAMINE 30 MG/ML IJ SOLN
30.0000 mg | Freq: Once | INTRAMUSCULAR | Status: AC
  Administered 2015-08-02: 30 mg via INTRAVENOUS
  Filled 2015-08-02: qty 1

## 2015-08-02 MED ORDER — METOCLOPRAMIDE HCL 5 MG/ML IJ SOLN
5.0000 mg | Freq: Once | INTRAMUSCULAR | Status: AC
  Administered 2015-08-02: 5 mg via INTRAVENOUS
  Filled 2015-08-02: qty 2

## 2015-08-02 MED ORDER — DIPHENHYDRAMINE HCL 50 MG/ML IJ SOLN
12.5000 mg | Freq: Once | INTRAMUSCULAR | Status: AC
  Administered 2015-08-02: 12.5 mg via INTRAVENOUS
  Filled 2015-08-02: qty 1

## 2015-08-02 MED ORDER — POTASSIUM CHLORIDE CRYS ER 20 MEQ PO TBCR: 20.0000 meq | EXTENDED_RELEASE_TABLET | Freq: Two times a day (BID) | ORAL | Status: DC

## 2015-08-02 MED ORDER — SODIUM CHLORIDE 0.9 % IV BOLUS (SEPSIS)
1000.0000 mL | Freq: Once | INTRAVENOUS | Status: AC
  Administered 2015-08-02: 1000 mL via INTRAVENOUS

## 2015-08-02 MED ORDER — NAPROXEN 500 MG PO TABS: ORAL_TABLET | ORAL | Status: DC

## 2015-08-02 MED ORDER — DICYCLOMINE HCL 10 MG/ML IM SOLN
20.0000 mg | Freq: Once | INTRAMUSCULAR | Status: AC
Start: 2015-08-02 — End: 2015-08-02
  Administered 2015-08-02: 20 mg via INTRAMUSCULAR
  Filled 2015-08-02: qty 2

## 2015-08-02 NOTE — Discharge Instructions (Signed)
Take the medication for pain as directed. Your potassium was mildly low take the potassium pills until gone. Recheck if you get fever, vomiting, diarrhea, or worsening pain.   Hypokalemia Hypokalemia means that the amount of potassium in the blood is lower than normal.Potassium is a chemical, called an electrolyte, that helps regulate the amount of fluid in the body. It also stimulates muscle contraction and helps nerves function properly.Most of the body's potassium is inside of cells, and only a very small amount is in the blood. Because the amount in the blood is so small, minor changes can be life-threatening. CAUSES  Antibiotics.  Diarrhea or vomiting.  Using laxatives too much, which can cause diarrhea.  Chronic kidney disease.  Water pills (diuretics).  Eating disorders (bulimia).  Low magnesium level.  Sweating a lot. SIGNS AND SYMPTOMS  Weakness.  Constipation.  Fatigue.  Muscle cramps.  Mental confusion.  Skipped heartbeats or irregular heartbeat (palpitations).  Tingling or numbness. DIAGNOSIS  Your health care provider can diagnose hypokalemia with blood tests. In addition to checking your potassium level, your health care provider may also check other lab tests. TREATMENT Hypokalemia can be treated with potassium supplements taken by mouth or adjustments in your current medicines. If your potassium level is very low, you may need to get potassium through a vein (IV) and be monitored in the hospital. A diet high in potassium is also helpful. Foods high in potassium are:  Nuts, such as peanuts and pistachios.  Seeds, such as sunflower seeds and pumpkin seeds.  Peas, lentils, and lima beans.  Whole grain and bran cereals and breads.  Fresh fruit and vegetables, such as apricots, avocado, bananas, cantaloupe, kiwi, oranges, tomatoes, asparagus, and potatoes.  Orange and tomato juices.  Red meats.  Fruit yogurt. HOME CARE INSTRUCTIONS  Take all  medicines as prescribed by your health care provider.  Maintain a healthy diet by including nutritious food, such as fruits, vegetables, nuts, whole grains, and lean meats.  If you are taking a laxative, be sure to follow the directions on the label. SEEK MEDICAL CARE IF:  Your weakness gets worse.  You feel your heart pounding or racing.  You are vomiting or having diarrhea.  You are diabetic and having trouble keeping your blood glucose in the normal range. SEEK IMMEDIATE MEDICAL CARE IF:  You have chest pain, shortness of breath, or dizziness.  You are vomiting or having diarrhea for more than 2 days.  You faint. MAKE SURE YOU:   Understand these instructions.  Will watch your condition.  Will get help right away if you are not doing well or get worse.   This information is not intended to replace advice given to you by your health care provider. Make sure you discuss any questions you have with your health care provider.   Document Released: 05/16/2005 Document Revised: 06/06/2014 Document Reviewed: 11/16/2012 Elsevier Interactive Patient Education 2016 Elsevier Inc.  Pelvic Pain, Female Female pelvic pain can be caused by many different things and start from a variety of places. Pelvic pain refers to pain that is located in the lower half of the abdomen and between your hips. The pain may occur over a short period of time (acute) or may be reoccurring (chronic). The cause of pelvic pain may be related to disorders affecting the female reproductive organs (gynecologic), but it may also be related to the bladder, kidney stones, an intestinal complication, or muscle or skeletal problems. Getting help right away for pelvic pain is  important, especially if there has been severe, sharp, or a sudden onset of unusual pain. It is also important to get help right away because some types of pelvic pain can be life threatening.  CAUSES  Below are only some of the causes of pelvic pain.  The causes of pelvic pain can be in one of several categories.   Gynecologic.  Pelvic inflammatory disease.  Sexually transmitted infection.  Ovarian cyst or a twisted ovarian ligament (ovarian torsion).  Uterine lining that grows outside the uterus (endometriosis).  Fibroids, cysts, or tumors.  Ovulation.  Pregnancy.  Pregnancy that occurs outside the uterus (ectopic pregnancy).  Miscarriage.  Labor.  Abruption of the placenta or ruptured uterus.  Infection.  Uterine infection (endometritis).  Bladder infection.  Diverticulitis.  Miscarriage related to a uterine infection (septic abortion).  Bladder.  Inflammation of the bladder (cystitis).  Kidney stone(s).  Gastrointestinal.  Constipation.  Diverticulitis.  Neurologic.  Trauma.  Feeling pelvic pain because of mental or emotional causes (psychosomatic).  Cancers of the bowel or pelvis. EVALUATION  Your caregiver will want to take a careful history of your concerns. This includes recent changes in your health, a careful gynecologic history of your periods (menses), and a sexual history. Obtaining your family history and medical history is also important. Your caregiver may suggest a pelvic exam. A pelvic exam will help identify the location and severity of the pain. It also helps in the evaluation of which organ system may be involved. In order to identify the cause of the pelvic pain and be properly treated, your caregiver may order tests. These tests may include:   A pregnancy test.  Pelvic ultrasonography.  An X-ray exam of the abdomen.  A urinalysis or evaluation of vaginal discharge.  Blood tests. HOME CARE INSTRUCTIONS   Only take over-the-counter or prescription medicines for pain, discomfort, or fever as directed by your caregiver.   Rest as directed by your caregiver.   Eat a balanced diet.   Drink enough fluids to make your urine clear or pale yellow, or as directed.   Avoid  sexual intercourse if it causes pain.   Apply warm or cold compresses to the lower abdomen depending on which one helps the pain.   Avoid stressful situations.   Keep a journal of your pelvic pain. Write down when it started, where the pain is located, and if there are things that seem to be associated with the pain, such as food or your menstrual cycle.  Follow up with your caregiver as directed.  SEEK MEDICAL CARE IF:  Your medicine does not help your pain.  You have abnormal vaginal discharge. SEEK IMMEDIATE MEDICAL CARE IF:   You have heavy bleeding from the vagina.   Your pelvic pain increases.   You feel light-headed or faint.   You have chills.   You have pain with urination or blood in your urine.   You have uncontrolled diarrhea or vomiting.   You have a fever or persistent symptoms for more than 3 days.  You have a fever and your symptoms suddenly get worse.   You are being physically or sexually abused.   This information is not intended to replace advice given to you by your health care provider. Make sure you discuss any questions you have with your health care provider.   Document Released: 04/12/2004 Document Revised: 02/04/2015 Document Reviewed: 09/05/2011 Elsevier Interactive Patient Education [employer].

## 2015-08-02 NOTE — ED Provider Notes (Signed)
CSN: [id number]     Arrival date & time 08/02/15  0050 History   First MD Initiated Contact with Patient 08/02/15 0315   Chief Complaint  Patient presents with  . Abdominal Cramping     (Consider location/radiation/quality/duration/timing/severity/associated sxs/prior Treatment) HPI patient states on March 3 she started having lower abdominal pain bilaterally that goes up to her navel. She describes the pain as constant and cramping. Nothing she does makes it hurt more, nothing she does makes it feel better. She denies nausea, vomiting, diarrhea, or dysuria. She has had frequency for about a month. She states she is only having a small amount of normal discharge. She is 2 months postpartum. She reports she has not had a period since the birth of her child. She states his pain is very similar to when she had her gallstones and had to have her gallbladder removed about 19 years ago.  PCP None GYN Dr Elonda Husky  Past Medical History  Diagnosis Date  . Migraine   . Pregnant 11/12/2014  . AMA (advanced maternal age) multigravida 35+ 11/12/2014  . Round ligament pain 11/12/2014  . Short of breath on exertion 11/12/2014  . Trichomonas infection    Past Surgical History  Procedure Laterality Date  . Cholecystectomy     Family History  Problem Relation Age of Onset  . Diabetes Mother   . Hypertension Mother   . Kidney disease Mother   . Alzheimer's disease Father   . Kidney disease Sister   . Asthma Son   . Diabetes Sister   . Asthma Son   . Heart murmur Son    Social History  Substance Use Topics  . Smoking status: Never Smoker   . Smokeless tobacco: Never Used  . Alcohol Use: No   Employed  OB History    Gravida Para Term Preterm AB TAB SAB Ectopic Multiple Living   10 7 7  0 3  1  0 7     Review of Systems  All other systems reviewed and are negative.     Allergies  Review of patient's allergies indicates no known allergies.  Home Medications   Prior to Admission  medications   Medication Sig Start Date End Date Taking? Authorizing Provider  norethindrone (MICRONOR,CAMILA,ERRIN) 0.35 MG tablet Take 1 tablet (0.35 mg total) by mouth daily. 07/09/15  Yes Christin Fudge, CNM  prenatal vitamin w/FE, FA (PRENATAL 1 + 1) 27-1 MG TABS tablet Take 1 tablet by mouth daily at 12 noon. 11/12/14  Yes Estill Dooms, NP  naproxen (NAPROSYN) 500 MG tablet Take 1 po BID with food prn pain 08/02/15   Rolland Porter, MD  potassium chloride SA (K-DUR,KLOR-CON) 20 MEQ tablet Take 1 tablet (20 mEq total) by mouth 2 (two) times daily. 08/02/15   Rolland Porter, MD   BP 124/74 mmHg  Pulse 76  Temp(Src) 98.1 F (36.7 C) (Oral)  Resp 20  Ht 5\' 6"  (1.676 m)  Wt 205 lb (92.987 kg)  BMI 33.10 kg/m2  SpO2 99% Physical Exam  Constitutional: She is oriented to person, place, and time. She appears well-developed and well-nourished.  Non-toxic appearance. She does not appear ill. No distress.  HENT:  Head: Normocephalic and atraumatic.  Right Ear: External ear normal.  Left Ear: External ear normal.  Nose: Nose normal. No mucosal edema or rhinorrhea.  Mouth/Throat: Oropharynx is clear and moist and mucous membranes are normal. No dental abscesses or uvula swelling.  Eyes: Conjunctivae and EOM are normal. Pupils are equal,  round, and reactive to light.  Neck: Normal range of motion and full passive range of motion without pain. Neck supple.  Cardiovascular: Normal rate, regular rhythm and normal heart sounds.  Exam reveals no gallop and no friction rub.   No murmur heard. Pulmonary/Chest: Effort normal and breath sounds normal. No respiratory distress. She has no wheezes. She has no rhonchi. She has no rales. She exhibits no tenderness and no crepitus.  Abdominal: Soft. Normal appearance and bowel sounds are normal. She exhibits no distension. There is tenderness. There is no rebound and no guarding.    She has diffuse lower abdominal tenderness and states it's most painful  around her umbilicus.  Musculoskeletal: Normal range of motion. She exhibits no edema or tenderness.  Moves all extremities well.   Neurological: She is alert and oriented to person, place, and time. She has normal strength. No cranial nerve deficit.  Skin: Skin is warm, dry and intact. No rash noted. No erythema. No pallor.  Psychiatric: She has a normal mood and affect. Her speech is normal and behavior is normal. Her mood appears not anxious.  Nursing note and vitals reviewed.   ED Course  Procedures (including critical care time)  Medications  sodium chloride 0.9 % bolus 1,000 mL (0 mLs Intravenous Stopped 08/02/15 0518)  metoCLOPramide (REGLAN) injection 5 mg (5 mg Intravenous Given 08/02/15 0409)  diphenhydrAMINE (BENADRYL) injection 12.5 mg (12.5 mg Intravenous Given 08/02/15 0409)  dicyclomine (BENTYL) injection 20 mg (20 mg Intramuscular Given 08/02/15 0410)  ketorolac (TORADOL) 30 MG/ML injection 30 mg (30 mg Intravenous Given 08/02/15 0409)    Patient had IV inserted. She was given IV Reglan and Benadryl reduced dose, Bentyl IM. An IV Toradol.  Recheck at 5:30 AM patient states her pain is improved but still present. We discussed waiting until ultrasound comes in in the morning to do a pelvic ultrasound. Patient is agreeable.  PT was left at change of shift with Dr Roderic Palau to get her pelvic US.    Labs Review Results for orders placed or performed during the hospital encounter of 08/02/15  Comprehensive metabolic panel  Result Value Ref Range   Sodium 136 135 - 145 mmol/L   Potassium 3.2 (L) 3.5 - 5.1 mmol/L   Chloride 106 101 - 111 mmol/L   CO2 26 22 - 32 mmol/L   Glucose, Bld 91 65 - 99 mg/dL   BUN 4 (L) 6 - 20 mg/dL   Creatinine, Ser 0.57 0.44 - 1.00 mg/dL   Calcium 8.5 (L) 8.9 - 10.3 mg/dL   Total Protein 7.2 6.5 - 8.1 g/dL   Albumin 3.5 3.5 - 5.0 g/dL   AST 19 15 - 41 U/L   ALT 16 14 - 54 U/L   Alkaline Phosphatase 48 38 - 126 U/L   Total Bilirubin 0.5 0.3 - 1.2 mg/dL    GFR calc non Af Amer >60 >60 mL/min   GFR calc Af Amer >60 >60 mL/min   Anion gap 4 (L) 5 - 15  CBC with Differential  Result Value Ref Range   WBC 9.8 4.0 - 10.5 K/uL   RBC 5.47 (H) 3.87 - 5.11 MIL/uL   Hemoglobin 14.6 12.0 - 15.0 g/dL   HCT 44.5 36.0 - 46.0 %   MCV 81.4 78.0 - 100.0 fL   MCH 26.7 26.0 - 34.0 pg   MCHC 32.8 30.0 - 36.0 g/dL   RDW 13.8 11.5 - 15.5 %   Platelets 238 150 - 400 K/uL  Neutrophils Relative % 64 %   Neutro Abs 6.4 1.7 - 7.7 K/uL   Lymphocytes Relative 30 %   Lymphs Abs 2.9 0.7 - 4.0 K/uL   Monocytes Relative 5 %   Monocytes Absolute 0.4 0.1 - 1.0 K/uL   Eosinophils Relative 1 %   Eosinophils Absolute 0.1 0.0 - 0.7 K/uL   Basophils Relative 0 %   Basophils Absolute 0.0 0.0 - 0.1 K/uL  Lipase, blood  Result Value Ref Range   Lipase 22 11 - 51 U/L  POC Urine Pregnancy, ED (do NOT order at [facility])  Result Value Ref Range   Preg Test, Ur NEGATIVE NEGATIVE   Laboratory interpretation all normal except hypoklaemia     Imaging Review No results found. I have personally reviewed and evaluated these images and lab results as part of my medical decision-making.   EKG Interpretation None      MDM   Final diagnoses:  Pelvic pain in female  Hypokalemia   New Prescriptions   NAPROXEN (NAPROSYN) 500 MG TABLET    Take 1 po BID with food prn pain   POTASSIUM CHLORIDE SA (K-DUR,KLOR-CON) 20 MEQ TABLET    Take 1 tablet (20 mEq total) by mouth 2 (two) times daily.    Disposition pending [place] results   Rolland Porter, MD, Barbette Or, MD 08/02/15 [phone]

## 2015-08-02 NOTE — ED Notes (Signed)
Pt states that she delivered a baby on May 26 2015, has not started her regular menstrual cycles yet, started having lower abd cramping yesterday, denies any discharge,

## 2015-08-02 NOTE — ED Notes (Signed)
Pt aware that [place] will no be here until 9am. Pt states she is willing to stay here until then.

## 2015-08-20 ENCOUNTER — Encounter: Payer: Self-pay | Admitting: *Deleted

## 2015-08-20 ENCOUNTER — Ambulatory Visit: Payer: 59 | Admitting: Advanced Practice Midwife

## 2015-10-16 ENCOUNTER — Encounter (HOSPITAL_COMMUNITY): Payer: Self-pay | Admitting: Emergency Medicine

## 2015-10-16 ENCOUNTER — Emergency Department (HOSPITAL_COMMUNITY)
Admission: EM | Admit: 2015-10-16 | Discharge: 2015-10-17 | Disposition: A | Discharge status: Home / Self Care | Payer: Medicaid Other | Attending: Emergency Medicine | Admitting: Emergency Medicine

## 2015-10-16 DIAGNOSIS — N72 Inflammatory disease of cervix uteri: Secondary | ICD-10-CM

## 2015-10-16 DIAGNOSIS — R42 Dizziness and giddiness: Secondary | ICD-10-CM | POA: Diagnosis present

## 2015-10-16 DIAGNOSIS — Z79899 Other long term (current) drug therapy: Secondary | ICD-10-CM | POA: Diagnosis not present

## 2015-10-16 DIAGNOSIS — B9689 Other specified bacterial agents as the cause of diseases classified elsewhere: Secondary | ICD-10-CM

## 2015-10-16 DIAGNOSIS — N760 Acute vaginitis: Secondary | ICD-10-CM | POA: Insufficient documentation

## 2015-10-16 LAB — URINALYSIS, ROUTINE W REFLEX MICROSCOPIC
Bilirubin Urine: NEGATIVE
Glucose, UA: NEGATIVE mg/dL
KETONES UR: NEGATIVE mg/dL
LEUKOCYTES UA: NEGATIVE
NITRITE: NEGATIVE
PROTEIN: NEGATIVE mg/dL
Specific Gravity, Urine: 1.02 (ref 1.005–1.030)
pH: 6 (ref 5.0–8.0)

## 2015-10-16 LAB — CBC WITH DIFFERENTIAL/PLATELET
BASOS ABS: 0 10*3/uL (ref 0.0–0.1)
Basophils Relative: 0 %
EOS ABS: 0.1 10*3/uL (ref 0.0–0.7)
EOS PCT: 1 %
HCT: 42.9 % (ref 36.0–46.0)
HEMOGLOBIN: 13.7 g/dL (ref 12.0–15.0)
LYMPHS PCT: 23 %
Lymphs Abs: 2.4 10*3/uL (ref 0.7–4.0)
MCH: 25.5 pg — ABNORMAL LOW (ref 26.0–34.0)
MCHC: 31.9 g/dL (ref 30.0–36.0)
MCV: 79.7 fL (ref 78.0–100.0)
Monocytes Absolute: 0.3 10*3/uL (ref 0.1–1.0)
Monocytes Relative: 3 %
NEUTROS PCT: 73 %
Neutro Abs: 7.5 10*3/uL (ref 1.7–7.7)
PLATELETS: 329 10*3/uL (ref 150–400)
RBC: 5.38 MIL/uL — AB (ref 3.87–5.11)
RDW: 13.9 % (ref 11.5–15.5)
WBC: 10.3 10*3/uL (ref 4.0–10.5)

## 2015-10-16 LAB — BASIC METABOLIC PANEL
ANION GAP: 6 (ref 5–15)
BUN: 6 mg/dL (ref 6–20)
CHLORIDE: 104 mmol/L (ref 101–111)
CO2: 28 mmol/L (ref 22–32)
Calcium: 8.8 mg/dL — ABNORMAL LOW (ref 8.9–10.3)
Creatinine, Ser: 0.7 mg/dL (ref 0.44–1.00)
GFR calc Af Amer: >60 mL/min (ref >60)
Glucose, Bld: 97 mg/dL (ref 65–99)
POTASSIUM: 3.7 mmol/L (ref 3.5–5.1)
SODIUM: 138 mmol/L (ref 135–145)

## 2015-10-16 LAB — URINE MICROSCOPIC-ADD ON

## 2015-10-16 LAB — PREGNANCY, URINE: PREG TEST UR: NEGATIVE

## 2015-10-16 MED ORDER — MECLIZINE HCL 12.5 MG PO TABS
25.0000 mg | ORAL_TABLET | Freq: Once | ORAL | Status: AC
  Administered 2015-10-16: 25 mg via ORAL
  Filled 2015-10-16: qty 2

## 2015-10-16 MED ORDER — DIAZEPAM 2 MG PO TABS
2.0000 mg | ORAL_TABLET | Freq: Once | ORAL | Status: AC
  Administered 2015-10-16: 2 mg via ORAL
  Filled 2015-10-16: qty 1

## 2015-10-16 NOTE — ED Notes (Addendum)
Patient complaining of dizziness since yesterday and abdominal pain starting today. Patient ambulatory with steady gait at triage.

## 2015-10-16 NOTE — ED Provider Notes (Signed)
CSN: [id number]     Arrival date & time 10/16/15  1918 History   First MD Initiated Contact with Patient 10/16/15 2316     Chief Complaint  Patient presents with  . Dizziness     (Consider location/radiation/quality/duration/timing/severity/associated sxs/prior Treatment) The history is provided by the patient.   Joyce [name] is a 40 y.o. female with several complaints, the first being dizziness described as "the room spinning" intermittently with positional changes which started yesterday. She denies headache, vision changes, ear pain or loss of hearing acuity and denies focal weakness or numbness.  She denies head injury or prior history of vertigo.  Additionally, also starting yesterday she has noted increased vaginal discharge with a "fishy" odor along with midline low pelvic cramping pain.  She denies fevers, chills, nausea, vomiting,dysuria, diarrhea or rectal bleeding, hematuria and back pain.  She has had unprotected sex with a new partner within the past 2 months and notes he had a discharge too.     Past Medical History  Diagnosis Date  . Migraine   . Pregnant 11/12/2014  . AMA (advanced maternal age) multigravida 35+ 11/12/2014  . Round ligament pain 11/12/2014  . Short of breath on exertion 11/12/2014  . Trichomonas infection    Past Surgical History  Procedure Laterality Date  . Cholecystectomy     Family History  Problem Relation Age of Onset  . Diabetes Mother   . Hypertension Mother   . Kidney disease Mother   . Alzheimer's disease Father   . Kidney disease Sister   . Asthma Son   . Diabetes Sister   . Asthma Son   . Heart murmur Son    Social History  Substance Use Topics  . Smoking status: Never Smoker   . Smokeless tobacco: Never Used  . Alcohol Use: No   OB History    Gravida Para Term Preterm AB TAB SAB Ectopic Multiple Living   10 7 7  0 3  1  0 7     Review of Systems  Constitutional: Negative for fever and chills.  HENT: Negative for  congestion and sore throat.   Eyes: Negative.   Respiratory: Negative for chest tightness and shortness of breath.   Cardiovascular: Negative for chest pain.  Gastrointestinal: Negative for nausea and abdominal pain.  Genitourinary: Positive for vaginal discharge and pelvic pain. Negative for dysuria.  Musculoskeletal: Negative for joint swelling, arthralgias and neck pain.  Skin: Negative.  Negative for rash and wound.  Neurological: Positive for dizziness. Negative for weakness, light-headedness, numbness and headaches.  Psychiatric/Behavioral: Negative.       Allergies  Review of patient's allergies indicates no known allergies.  Home Medications   Prior to Admission medications   Medication Sig Start Date End Date Taking? Authorizing Provider  norethindrone (MICRONOR,CAMILA,ERRIN) 0.35 MG tablet Take 1 tablet (0.35 mg total) by mouth daily. 07/09/15  Yes Joaquim Lai Cresenzo-Dishmon, CNM  potassium chloride SA (K-DUR,KLOR-CON) 20 MEQ tablet Take 1 tablet (20 mEq total) by mouth 2 (two) times daily. 08/02/15  Yes Rolland Porter, MD  prenatal vitamin w/FE, FA (PRENATAL 1 + 1) 27-1 MG TABS tablet Take 1 tablet by mouth daily at 12 noon. 11/12/14  Yes Estill Dooms, NP  diazepam (VALIUM) 2 MG tablet Take 1 tablet (2 mg total) by mouth every 12 (twelve) hours as needed (dizziness). 10/17/15   Evalee Jefferson, PA-C  meclizine (ANTIVERT) 25 MG tablet Take 1 tablet (25 mg total) by mouth 3 (three) times daily as needed  for dizziness. 10/17/15   Evalee Jefferson, PA-C  metroNIDAZOLE (FLAGYL) 500 MG tablet Take 1 tablet (500 mg total) by mouth 2 (two) times daily. 10/17/15   Evalee Jefferson, PA-C  naproxen (NAPROSYN) 500 MG tablet Take 1 po BID with food prn pain Patient not taking: Reported on 10/16/2015 08/02/15   Rolland Porter, MD   BP 140/73 mmHg  Pulse 73  Temp(Src) 98.8 F (37.1 C) (Oral)  Resp 20  Ht 5\' 5"  (1.651 m)  Wt 97.523 kg  BMI 35.78 kg/m2  SpO2 98%  LMP 09/13/2015 Physical Exam  Constitutional: She  appears well-developed and well-nourished.  HENT:  Head: Normocephalic and atraumatic.  Eyes: Conjunctivae and EOM are normal. Pupils are equal, round, and reactive to light. Right eye exhibits no nystagmus. Left eye exhibits no nystagmus.  Neck: Normal range of motion.  Cardiovascular: Normal rate, regular rhythm, normal heart sounds and intact distal pulses.   Pulmonary/Chest: Effort normal and breath sounds normal. She has no wheezes.  Abdominal: Soft. Bowel sounds are normal. There is no tenderness.  Genitourinary: Uterus is tender. Cervix exhibits motion tenderness and discharge. Right adnexum displays no mass, no tenderness and no fullness. Left adnexum displays no mass, no tenderness and no fullness. No erythema in the vagina.  Mild midline ttp with bimanual exam.  Musculoskeletal: Normal range of motion.  Neurological: She is alert. She has normal strength. No cranial nerve deficit or sensory deficit. Coordination and gait normal. GCS eye subscore is 4. GCS verbal subscore is 5. GCS motor subscore is 6.  Skin: Skin is warm and dry.  Psychiatric: She has a normal mood and affect.  Nursing note and vitals reviewed.   ED Course  Procedures (including critical care time) Labs Review Labs Reviewed  WET PREP, GENITAL - Abnormal; Notable for the following:    Clue Cells Wet Prep HPF POC PRESENT (*)    WBC, Wet Prep HPF POC MANY (*)    All other components within normal limits  CBC WITH DIFFERENTIAL/PLATELET - Abnormal; Notable for the following:    RBC 5.38 (*)    MCH 25.5 (*)    All other components within normal limits  BASIC METABOLIC PANEL - Abnormal; Notable for the following:    Calcium 8.8 (*)    All other components within normal limits  URINALYSIS, ROUTINE W REFLEX MICROSCOPIC (NOT AT [facility]) - Abnormal; Notable for the following:    Hgb urine dipstick TRACE (*)    All other components within normal limits  URINE MICROSCOPIC-ADD ON - Abnormal; Notable for the following:     Squamous Epithelial / LPF 6-30 (*)    Bacteria, UA RARE (*)    All other components within normal limits  PREGNANCY, URINE  RPR  HIV ANTIBODY (ROUTINE TESTING)  GC/CHLAMYDIA PROBE AMP ([place]) NOT AT [facility]    Imaging Review No results found. I have personally reviewed and evaluated these images and lab results as part of my medical decision-making.   EKG Interpretation None      MDM   Final diagnoses:  Cervicitis  Vertigo  Bacterial vaginosis    Pt was given meclizine and valium with much improved vertigo sx.  No exam findings to suggest central source of dizziness. She was given rocephin, zithromax, aware cx pending. Advised no sex until partner tx and sx resolved assuming cx positive which I suspect will be the case.  She was given referrals for establishing pcp. Advised recheck in one week if vertigo remains. Flagyl prescribed.  The patient appears reasonably screened and/or stabilized for discharge and I doubt any other medical condition or other [facility] requiring further screening, evaluation, or treatment in the ED at this time prior to discharge.     Evalee Jefferson, PA-C 10/17/15 [place], DO 10/20/15 [phone]

## 2015-10-17 LAB — WET PREP, GENITAL
Sperm: NONE SEEN
TRICH WET PREP: NONE SEEN
YEAST WET PREP: NONE SEEN

## 2015-10-17 MED ORDER — DIAZEPAM 2 MG PO TABS: 2.0000 mg | ORAL_TABLET | Freq: Two times a day (BID) | ORAL | Status: DC | PRN

## 2015-10-17 MED ORDER — CEFTRIAXONE SODIUM 250 MG IJ SOLR
250.0000 mg | Freq: Once | INTRAMUSCULAR | Status: AC
  Administered 2015-10-17: 250 mg via INTRAMUSCULAR
  Filled 2015-10-17: qty 250

## 2015-10-17 MED ORDER — LIDOCAINE HCL (PF) 1 % IJ SOLN
INTRAMUSCULAR | Status: AC
  Administered 2015-10-17: 5 mL
  Filled 2015-10-17: qty 5

## 2015-10-17 MED ORDER — AZITHROMYCIN 250 MG PO TABS
1000.0000 mg | ORAL_TABLET | Freq: Once | ORAL | Status: AC
  Administered 2015-10-17: 1000 mg via ORAL
  Filled 2015-10-17: qty 4

## 2015-10-17 MED ORDER — MECLIZINE HCL 25 MG PO TABS: 25.0000 mg | ORAL_TABLET | Freq: Three times a day (TID) | ORAL | Status: DC | PRN

## 2015-10-17 MED ORDER — METRONIDAZOLE 500 MG PO TABS: 500.0000 mg | ORAL_TABLET | Freq: Two times a day (BID) | ORAL | Status: DC

## 2015-10-17 NOTE — Discharge Instructions (Signed)
Bacterial Vaginosis Bacterial vaginosis is an infection of the vagina. It happens when too many germs (bacteria) grow in the vagina. Having this infection puts you at risk for getting other infections from sex. Treating this infection can help lower your risk for other infections, such as:   Chlamydia.  Gonorrhea.  HIV.  Herpes. HOME CARE  Take your medicine as told by your doctor.  Finish your medicine even if you start to feel better.  Tell your sex partner that you have an infection. They should see their doctor for treatment.  During treatment:  Avoid sex or use condoms correctly.  Do not douche.  Do not drink alcohol unless your doctor tells you it is ok.  Do not breastfeed unless your doctor tells you it is ok. GET HELP IF:  You are not getting better after 3 days of treatment.  You have more grey fluid (discharge) coming from your vagina than before.  You have more pain than before.  You have a fever. MAKE SURE YOU:   Understand these instructions.  Will watch your condition.  Will get help right away if you are not doing well or get worse.   This information is not intended to replace advice given to you by your health care provider. Make sure you discuss any questions you have with your health care provider.   Document Released: 02/23/2008 Document Revised: 06/06/2014 Document Reviewed: 12/26/2012 Elsevier Interactive Patient Education 2016 [employer]vier Inc[employer]  Cervicitis Cervicitis is a soreness and puffiness (inflammation) of the cervix.  HOME CARE  Do not have sex (intercourse) until your doctor says it is okay.  Do not have sex until your partner is treated or as told by your doctor.  Take your antibiotic medicine as told. Finish it even if you start to feel better. GET HELP IF:   Your symptoms that brought you to the doctor come back.  You have a fever. MAKE SURE YOU:   Understand these instructions.  Will watch your condition.  Will get  help right away if you are not doing well or get worse.   This information is not intended to replace advice given to you by your health care provider. Make sure you discuss any questions you have with your health care provider.   Document Released: 02/23/2008 Document Revised: 05/21/2013 Document Reviewed: 11/07/2012 Elsevier Interactive Patient Education 2016 [employer].  Vertigo Vertigo means you feel like you or your surroundings are moving when they are not. Vertigo can be dangerous if it occurs when you are at work, driving, or performing difficult activities.  CAUSES  Vertigo occurs when there is a conflict of signals sent to your brain from the visual and sensory systems in your body. There are many different causes of vertigo, including:  Infections, especially in the inner ear.  A bad reaction to a drug or misuse of alcohol and medicines.  Withdrawal from drugs or alcohol.  Rapidly changing positions, such as lying down or rolling over in bed.  A migraine headache.  Decreased blood flow to the brain.  Increased pressure in the brain from a head injury, infection, tumor, or bleeding. SYMPTOMS  You may feel as though the world is spinning around or you are falling to the ground. Because your balance is upset, vertigo can cause nausea and vomiting. You may have involuntary eye movements (nystagmus). DIAGNOSIS  Vertigo is usually diagnosed by physical exam. If the cause of your vertigo is unknown, your caregiver may perform imaging tests, such  as an MRI scan (magnetic resonance imaging). TREATMENT  Most cases of vertigo resolve on their own, without treatment. Depending on the cause, your caregiver may prescribe certain medicines. If your vertigo is related to body position issues, your caregiver may recommend movements or procedures to correct the problem. In rare cases, if your vertigo is caused by certain inner ear problems, you may need surgery. HOME CARE INSTRUCTIONS    Follow your caregiver's instructions.  Avoid driving.  Avoid operating heavy machinery.  Avoid performing any tasks that would be dangerous to you or others during a vertigo episode.  Tell your caregiver if you notice that certain medicines seem to be causing your vertigo. Some of the medicines used to treat vertigo episodes can actually make them worse in some people. SEEK IMMEDIATE MEDICAL CARE IF:   Your medicines do not relieve your vertigo or are making it worse.  You develop problems with talking, walking, weakness, or using your arms, hands, or legs.  You develop severe headaches.  Your nausea or vomiting continues or gets worse.  You develop visual changes.  A family member notices behavioral changes.  Your condition gets worse. MAKE SURE YOU:  Understand these instructions.  Will watch your condition.  Will get help right away if you are not doing well or get worse.   This information is not intended to replace advice given to you by your health care provider. Make sure you discuss any questions you have with your health care provider.   Document Released: 02/23/2005 Document Revised: 08/08/2011 Document Reviewed: 09/08/2014 Elsevier Interactive Patient Education [employer].

## 2015-10-18 LAB — HIV ANTIBODY (ROUTINE TESTING W REFLEX): HIV SCREEN 4TH GENERATION: NONREACTIVE

## 2015-10-19 LAB — GC/CHLAMYDIA PROBE AMP (~~LOC~~) NOT AT ARMC
CHLAMYDIA, DNA PROBE: NEGATIVE
NEISSERIA GONORRHEA: NEGATIVE

## 2015-10-20 LAB — RPR: RPR: NONREACTIVE

## 2015-12-23 ENCOUNTER — Other Ambulatory Visit: Payer: Self-pay | Admitting: Adult Health

## 2015-12-31 ENCOUNTER — Emergency Department (HOSPITAL_COMMUNITY)
Admission: EM | Admit: 2015-12-31 | Discharge: 2016-01-01 | Disposition: A | Discharge status: Home / Self Care | Payer: Medicaid Other | Attending: Emergency Medicine | Admitting: Emergency Medicine

## 2015-12-31 ENCOUNTER — Emergency Department (HOSPITAL_COMMUNITY): Payer: Medicaid Other

## 2015-12-31 ENCOUNTER — Encounter (HOSPITAL_COMMUNITY): Payer: Self-pay | Admitting: Emergency Medicine

## 2015-12-31 DIAGNOSIS — Z3A09 9 weeks gestation of pregnancy: Secondary | ICD-10-CM | POA: Diagnosis not present

## 2015-12-31 DIAGNOSIS — Z3491 Encounter for supervision of normal pregnancy, unspecified, first trimester: Secondary | ICD-10-CM

## 2015-12-31 DIAGNOSIS — R079 Chest pain, unspecified: Secondary | ICD-10-CM | POA: Insufficient documentation

## 2015-12-31 DIAGNOSIS — O26891 Other specified pregnancy related conditions, first trimester: Secondary | ICD-10-CM | POA: Diagnosis present

## 2015-12-31 LAB — URINALYSIS, ROUTINE W REFLEX MICROSCOPIC
Bilirubin Urine: NEGATIVE
Glucose, UA: NEGATIVE mg/dL
Ketones, ur: NEGATIVE mg/dL
Nitrite: NEGATIVE
Protein, ur: NEGATIVE mg/dL
SPECIFIC GRAVITY, URINE: 1.015 (ref 1.005–1.030)
pH: 6 (ref 5.0–8.0)

## 2015-12-31 LAB — URINE MICROSCOPIC-ADD ON

## 2015-12-31 LAB — BASIC METABOLIC PANEL
Anion gap: 7 (ref 5–15)
BUN: 7 mg/dL (ref 6–20)
CHLORIDE: 101 mmol/L (ref 101–111)
CO2: 24 mmol/L (ref 22–32)
Calcium: 8.9 mg/dL (ref 8.9–10.3)
Creatinine, Ser: 0.54 mg/dL (ref 0.44–1.00)
GFR calc Af Amer: >60 mL/min (ref >60)
GFR calc non Af Amer: >60 mL/min (ref >60)
GLUCOSE: 94 mg/dL (ref 65–99)
POTASSIUM: 3.1 mmol/L — AB (ref 3.5–5.1)
SODIUM: 132 mmol/L — AB (ref 135–145)

## 2015-12-31 LAB — CBC
HEMATOCRIT: 41.2 % (ref 36.0–46.0)
Hemoglobin: 13.7 g/dL (ref 12.0–15.0)
MCH: 25.7 pg — ABNORMAL LOW (ref 26.0–34.0)
MCHC: 33.3 g/dL (ref 30.0–36.0)
MCV: 77.3 fL — AB (ref 78.0–100.0)
Platelets: 250 10*3/uL (ref 150–400)
RBC: 5.33 MIL/uL — ABNORMAL HIGH (ref 3.87–5.11)
RDW: 14.9 % (ref 11.5–15.5)
WBC: 11.7 10*3/uL — AB (ref 4.0–10.5)

## 2015-12-31 LAB — I-STAT BETA HCG BLOOD, ED (MC, WL, AP ONLY): I-stat hCG, quantitative: >2000 m[IU]/mL — ABNORMAL HIGH (ref <5)

## 2015-12-31 LAB — I-STAT TROPONIN, ED: Troponin i, poc: 0 ng/mL (ref 0.00–0.08)

## 2015-12-31 LAB — POC URINE PREG, ED: PREG TEST UR: POSITIVE — AB

## 2015-12-31 LAB — D-DIMER, QUANTITATIVE: D-Dimer, Quant: 0.37 ug/mL-FEU (ref 0.00–0.50)

## 2015-12-31 IMAGING — DX DG CHEST 2V
2 series · 2 of 2 positions shown · non-contrast
Comparison: None.

CLINICAL DATA: Initial evaluation for acute mid chest pain for 4
days, shortness of breath.

EXAM:
CHEST  2 VIEW

[chest pa]
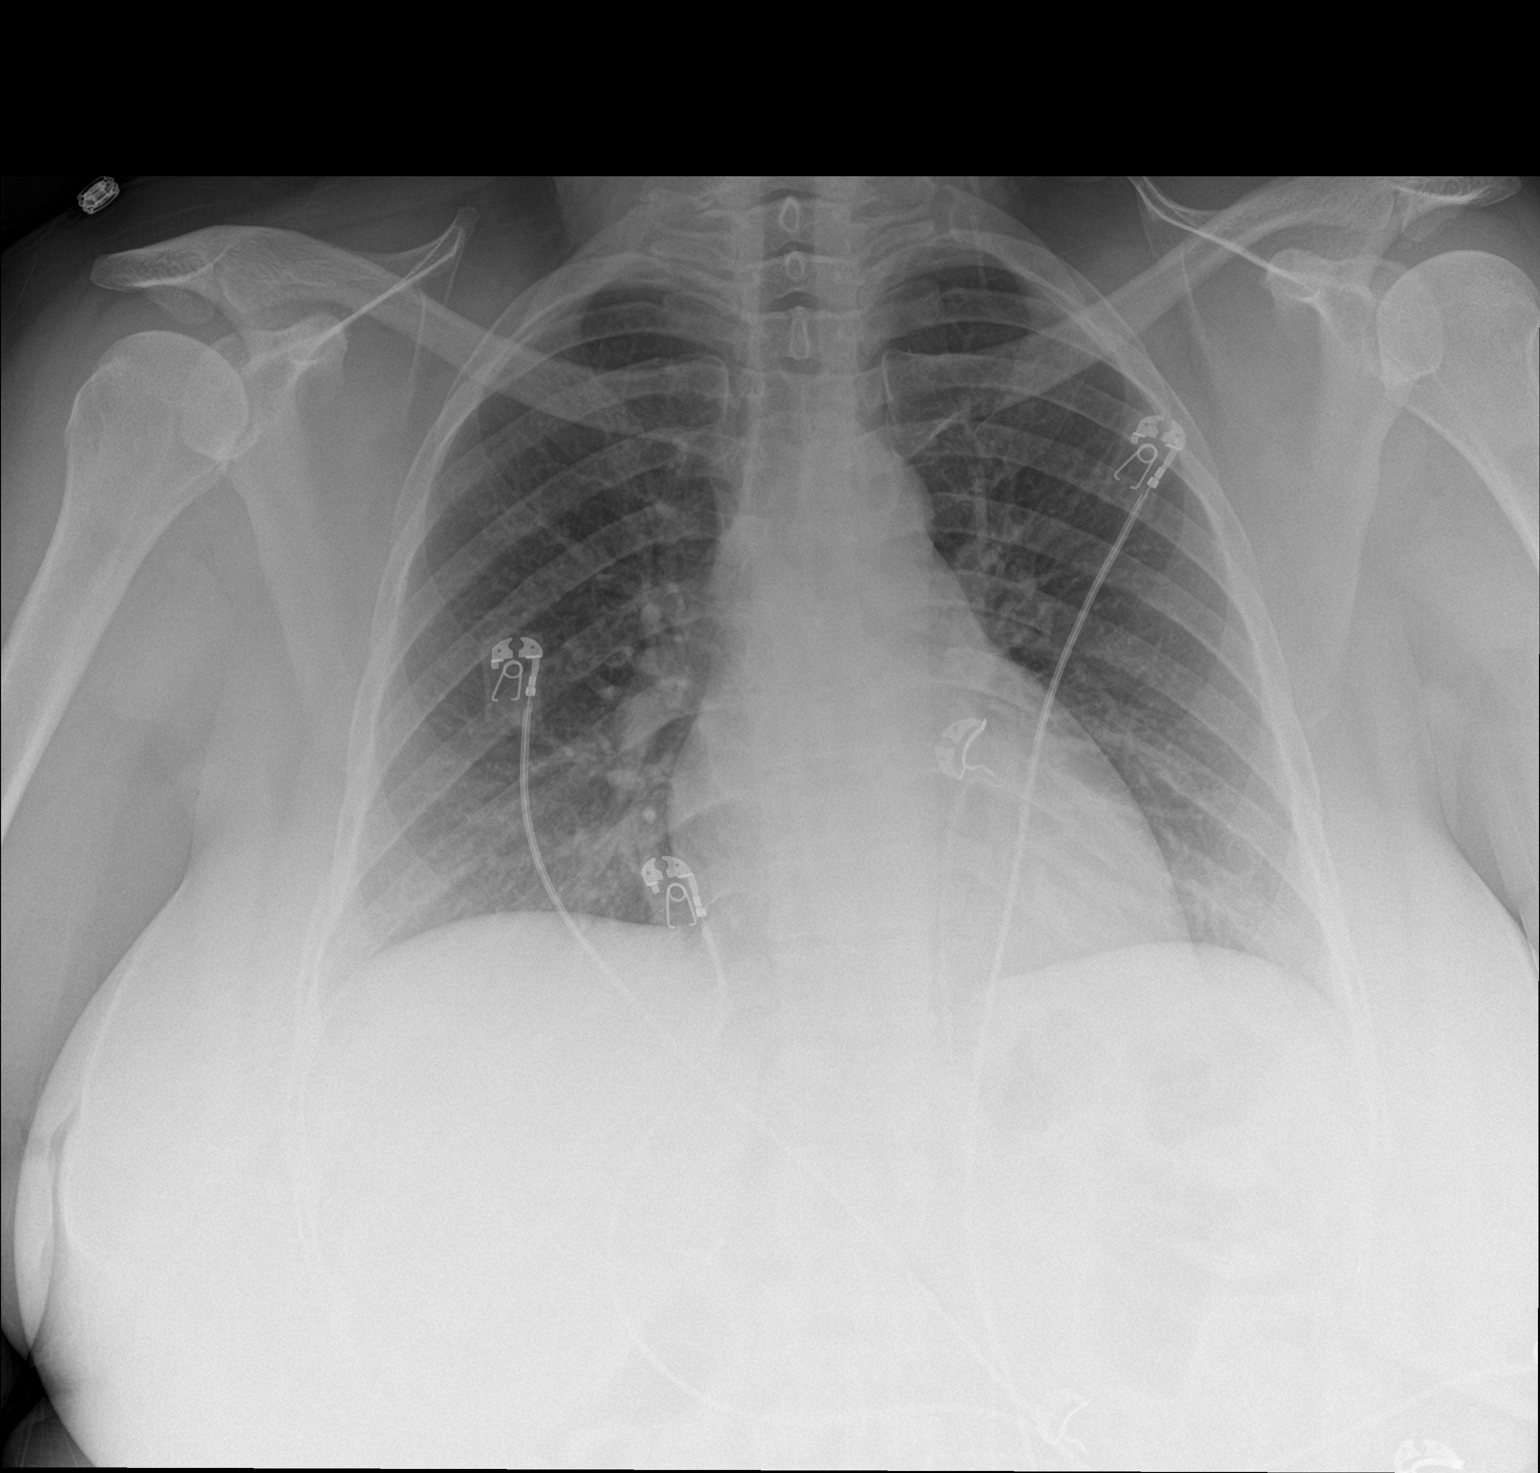

[chest lat]
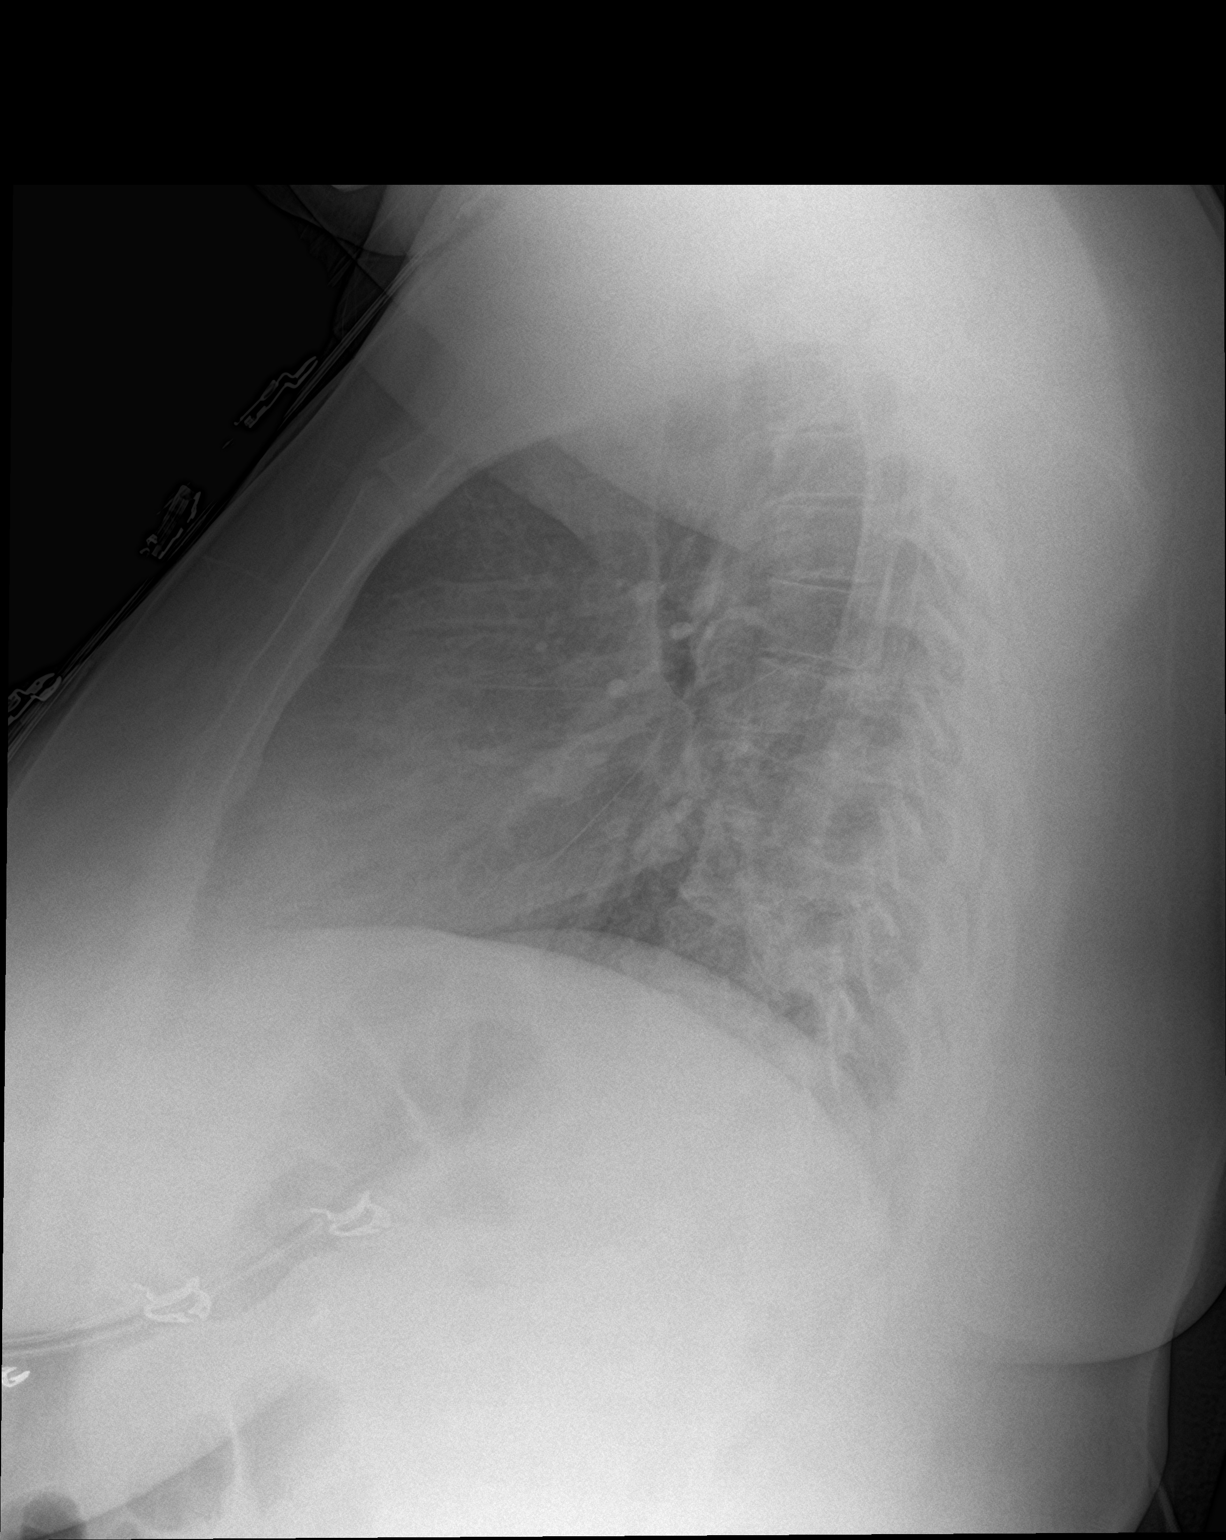

[2 of 2 positions shown; findings below may reference images not displayed]

FINDINGS: The heart size and mediastinal contours are within normal limits.
Both lungs are clear. The visualized skeletal structures are
unremarkable.
IMPRESSION: No active cardiopulmonary disease.

## 2015-12-31 MED ORDER — FAMOTIDINE IN NACL 20-0.9 MG/50ML-% IV SOLN
20.0000 mg | Freq: Once | INTRAVENOUS | Status: AC
  Administered 2015-12-31: 20 mg via INTRAVENOUS
  Filled 2015-12-31: qty 50

## 2015-12-31 MED ORDER — SODIUM CHLORIDE 0.9 % IV BOLUS (SEPSIS)
500.0000 mL | Freq: Once | INTRAVENOUS | Status: AC
  Administered 2015-12-31: 500 mL via INTRAVENOUS

## 2015-12-31 MED ORDER — ALUM & MAG HYDROXIDE-SIMETH 200-200-20 MG/5ML PO SUSP
30.0000 mL | Freq: Once | ORAL | Status: AC
  Administered 2015-12-31: 30 mL via ORAL
  Filled 2015-12-31: qty 30

## 2015-12-31 MED ORDER — ONDANSETRON HCL 4 MG/2ML IJ SOLN
4.0000 mg | Freq: Once | INTRAMUSCULAR | Status: AC
  Administered 2015-12-31: 4 mg via INTRAVENOUS
  Filled 2015-12-31: qty 2

## 2015-12-31 NOTE — ED Provider Notes (Signed)
[facility] DEPT Provider Note   CSN: [id number] Arrival date & time: 12/31/15  2208  First Provider Contact:  First MD Initiated Contact with Patient 12/31/15 2309     By signing my name below, I, Dora Sims, attest that this documentation has been prepared under the direction and in the presence of physician practitioner, Orpah Greek, MD. Electronically Signed: Dora Sims, Scribe. 12/31/2015. 11:09 PM.  History   Chief Complaint Chief Complaint  Patient presents with  . Chest Pain    The history is provided by the patient. No language interpreter was used.     HPI Comments: Joyce [name] is a 40 y.o. female who presents to the Emergency Department complaining of sudden onset, intermittent, sharp, chest pain beginning two days ago. She states her chest pain became constant today. No alleviating factors noted. She endorses pain exacerbation with breathing, laughing, and eating. She denies h/o ulcer, h/o heart problems, or FHx of heart problems. She notes a h/o cholecystectomy. Pt has concern for acid reflux but denies ever being diagnosed with acid reflux. LMP June 5th. She denies SOB, abdominal pain, vaginal bleeding, dysuria, difficulty urinating, or any other associated symptoms.  Past Medical History:  Diagnosis Date  . AMA (advanced maternal age) multigravida 35+ 11/12/2014  . Migraine   . Pregnant 11/12/2014  . Round ligament pain 11/12/2014  . Short of breath on exertion 11/12/2014  . Trichomonas infection     Patient Active Problem List   Diagnosis Date Noted  . Rubella non-immune status, antepartum 12/08/2014  . 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 [place] multiparity 11/26/2014  . Trichomonas infection 11/26/2014  . Supervision of normal pregnancy 11/12/2014  . Round ligament pain 11/12/2014  . Short of breath on exertion 11/12/2014    Past Surgical History:  Procedure Laterality Date  . CHOLECYSTECTOMY       OB History    Gravida Para Term Preterm AB Living   11 7 7  0 3 7   SAB TAB Ectopic Multiple Live Births   1     0 7       Home Medications    Prior to Admission medications   Medication Sig Start Date End Date Taking? Authorizing Provider  diazepam (VALIUM) 2 MG tablet Take 1 tablet (2 mg total) by mouth every 12 (twelve) hours as needed (dizziness). 10/17/15   Evalee Jefferson, PA-C  meclizine (ANTIVERT) 25 MG tablet Take 1 tablet (25 mg total) by mouth 3 (three) times daily as needed for dizziness. 10/17/15   Evalee Jefferson, PA-C  metroNIDAZOLE (FLAGYL) 500 MG tablet Take 1 tablet (500 mg total) by mouth 2 (two) times daily. 10/17/15   Evalee Jefferson, PA-C  naproxen (NAPROSYN) 500 MG tablet Take 1 po BID with food prn pain Patient not taking: Reported on 10/16/2015 08/02/15   Rolland Porter, MD  norethindrone (MICRONOR,CAMILA,ERRIN) 0.35 MG tablet Take 1 tablet (0.35 mg total) by mouth daily. 07/09/15   Christin Fudge, CNM  potassium chloride SA (K-DUR,KLOR-CON) 20 MEQ tablet Take 1 tablet (20 mEq total) by mouth 2 (two) times daily. 08/02/15   Rolland Porter, MD  Prenatal Vit-Fe Fumarate-FA (PRENATAL VITAMIN PLUS LOW IRON) 27-1 MG TABS TAKE ONE TABLET BY MOUTH DAILY AT 12 NOON 12/24/15   Estill Dooms, NP    Family History Family History  Problem Relation Age of Onset  . Diabetes Mother   . Hypertension Mother   . Kidney disease Mother   . Alzheimer's disease  Father   . Kidney disease Sister   . Asthma Son   . Diabetes Sister   . Asthma Son   . Heart murmur Son     Social History Social History  Substance Use Topics  . Smoking status: Never Smoker  . Smokeless tobacco: Never Used  . Alcohol use No     Allergies   Review of patient's allergies indicates no known allergies.   Review of Systems Review of Systems  Respiratory: Negative for shortness of breath.   Cardiovascular: Positive for chest pain.  Gastrointestinal: Negative for abdominal pain.  Genitourinary: Negative  for difficulty urinating, dysuria and vaginal bleeding.  All other systems reviewed and are negative.   Physical Exam Updated Vital Signs LMP 11/02/2015   Physical Exam  Constitutional: She is oriented to person, place, and time. She appears well-developed and well-nourished. No distress.  HENT:  Head: Normocephalic and atraumatic.  Right Ear: Hearing normal.  Left Ear: Hearing normal.  Nose: Nose normal.  Mouth/Throat: Oropharynx is clear and moist and mucous membranes are normal.  Eyes: Conjunctivae and EOM are normal. Pupils are equal, round, and reactive to light.  Neck: Normal range of motion. Neck supple.  Cardiovascular: Regular rhythm, S1 normal and S2 normal.  Exam reveals no gallop and no friction rub.   No murmur heard. Pulmonary/Chest: Effort normal and breath sounds normal. No respiratory distress. She exhibits no tenderness.  Abdominal: Soft. Normal appearance and bowel sounds are normal. There is no hepatosplenomegaly. There is no tenderness. There is no rebound, no guarding, no tenderness at McBurney's point and negative Murphy's sign. No hernia.  Musculoskeletal: Normal range of motion.  Neurological: She is alert and oriented to person, place, and time. She has normal strength. No cranial nerve deficit or sensory deficit. Coordination normal. GCS eye subscore is 4. GCS verbal subscore is 5. GCS motor subscore is 6.  Skin: Skin is warm, dry and intact. No rash noted. No cyanosis.  Psychiatric: She has a normal mood and affect. Her speech is normal and behavior is normal. Thought content normal.  Nursing note and vitals reviewed.    ED Treatments / Results  Labs (all labs ordered are listed, but only abnormal results are displayed) Labs Reviewed  BASIC METABOLIC PANEL - Abnormal; Notable for the following:       Result Value   Sodium 132 (*)    Potassium 3.1 (*)    All other components within normal limits  CBC - Abnormal; Notable for the following:    WBC 11.7  (*)    RBC 5.33 (*)    MCV 77.3 (*)    MCH 25.7 (*)    All other components within normal limits  URINALYSIS, ROUTINE W REFLEX MICROSCOPIC (NOT AT [facility]) - Abnormal; Notable for the following:    Hgb urine dipstick TRACE (*)    Leukocytes, UA TRACE (*)    All other components within normal limits  URINE MICROSCOPIC-ADD ON - Abnormal; Notable for the following:    Squamous Epithelial / LPF 0-5 (*)    Bacteria, UA RARE (*)    All other components within normal limits  POC URINE PREG, ED - Abnormal; Notable for the following:    Preg Test, Ur POSITIVE (*)    All other components within normal limits  POC URINE PREG, ED  I-STAT TROPOININ, ED    EKG  EKG Interpretation None       Radiology Dg Chest 2 View  Result Date: 12/31/2015 CLINICAL DATA:  Initial evaluation for acute mid chest pain for 4 days, shortness of breath. EXAM: CHEST  2 VIEW COMPARISON:  None. FINDINGS: The heart size and mediastinal contours are within normal limits. Both lungs are clear. The visualized skeletal structures are unremarkable. IMPRESSION: No active cardiopulmonary disease. Electronically Signed   By: Jeannine Boga M.D.   On: 12/31/2015 23:03    Procedures Procedures (including critical care time)  DIAGNOSTIC STUDIES: Oxygen Saturation is 100% on RA, normal by my interpretation.    COORDINATION OF CARE: 11:13 PM Discussed treatment plan with pt at bedside and pt agreed to plan.   Medications Ordered in ED Medications - No data to display   Initial Impression / Assessment and Plan / ED Course  I have reviewed the triage vital signs and the nursing notes.  Pertinent labs & imaging results that were available during my care of the patient were reviewed by me and considered in my medical decision making (see chart for details).  Clinical Course   Complains of chest pain that started 2 days ago. Patient reports a constant dull pain in the center of her chest with intermittent episodes of  sharp stabbing pains. She denies any injury. Patient not experiencing abdominal discomfort. She does not have a history of GERD or peptic ulcer disease. Lab work is unremarkable, although she has incidentally found to be pregnant. She does not have any pelvic complaints, no discharge, urinary symptoms, vaginal bleeding. Last menstrual period June 5. No concern for obstetric complications. EKG normal. Troponin negative, which is very reassuring after 2 days of continuous pain. Patient has normal vital signs including normal oxygenation. D-dimer is negative. Patient now feeling some pain with movement of her torso, possibly musculoskeletal in nature, although reflux cannot be ruled out. Will treat for both. Follow-up with OB/GYN. Return if symptoms worsen.  I personally performed the services described in this documentation, which was scribed in my presence. The recorded information has been reviewed and is accurate.   Final Clinical Impressions(s) / ED Diagnoses   Final diagnoses:  None  Chest pain Early pregnancy, incidental finding  New Prescriptions New Prescriptions   No medications on file     Orpah Greek, MD 01/01/16 0011

## 2015-12-31 NOTE — ED Triage Notes (Signed)
Pt states she has been having intermittent chest pain since Tuesday but it has been constant today.

## 2016-01-01 LAB — HCG, QUANTITATIVE, PREGNANCY: hCG, Beta Chain, Quant, S: 135299 m[IU]/mL — ABNORMAL HIGH (ref <5)

## 2016-01-01 MED ORDER — MORPHINE SULFATE (PF) 2 MG/ML IV SOLN
2.0000 mg | Freq: Once | INTRAVENOUS | Status: AC
Start: 2016-01-01 — End: 2016-01-01
  Administered 2016-01-01: 2 mg via INTRAVENOUS
  Filled 2016-01-01: qty 1

## 2016-01-01 MED ORDER — RANITIDINE HCL 150 MG PO TABS: 150.0000 mg | ORAL_TABLET | Freq: Two times a day (BID) | ORAL | 0 refills | Status: DC

## 2016-01-01 MED ORDER — ONDANSETRON HCL 4 MG PO TABS: 4.0000 mg | ORAL_TABLET | Freq: Four times a day (QID) | ORAL | 0 refills | Status: DC | PRN

## 2016-01-01 NOTE — ED Notes (Signed)
Patient verbalizes understanding of discharge instructions, prescriptions, home care and follow up care. Patient out of department at this time. 

## 2016-01-10 ENCOUNTER — Emergency Department (HOSPITAL_COMMUNITY)
Admission: EM | Admit: 2016-01-10 | Discharge: 2016-01-11 | Disposition: A | Discharge status: Home / Self Care | Payer: Medicaid Other | Attending: Emergency Medicine | Admitting: Emergency Medicine

## 2016-01-10 ENCOUNTER — Encounter (HOSPITAL_COMMUNITY): Payer: Self-pay | Admitting: *Deleted

## 2016-01-10 DIAGNOSIS — W57XXXA Bitten or stung by nonvenomous insect and other nonvenomous arthropods, initial encounter: Secondary | ICD-10-CM | POA: Diagnosis not present

## 2016-01-10 DIAGNOSIS — Y939 Activity, unspecified: Secondary | ICD-10-CM | POA: Insufficient documentation

## 2016-01-10 DIAGNOSIS — S70362A Insect bite (nonvenomous), left thigh, initial encounter: Secondary | ICD-10-CM | POA: Diagnosis present

## 2016-01-10 DIAGNOSIS — Y929 Unspecified place or not applicable: Secondary | ICD-10-CM | POA: Insufficient documentation

## 2016-01-10 DIAGNOSIS — S80262A Insect bite (nonvenomous), left knee, initial encounter: Secondary | ICD-10-CM | POA: Diagnosis not present

## 2016-01-10 DIAGNOSIS — Y999 Unspecified external cause status: Secondary | ICD-10-CM | POA: Insufficient documentation

## 2016-01-10 MED ORDER — KETOROLAC TROMETHAMINE 10 MG PO TABS
10.0000 mg | ORAL_TABLET | Freq: Once | ORAL | Status: AC
  Administered 2016-01-10: 10 mg via ORAL
  Filled 2016-01-10: qty 1

## 2016-01-10 MED ORDER — PREDNISONE 20 MG PO TABS
40.0000 mg | ORAL_TABLET | Freq: Once | ORAL | Status: AC
  Administered 2016-01-10: 40 mg via ORAL
  Filled 2016-01-10: qty 2

## 2016-01-10 MED ORDER — HYDROXYZINE HCL 25 MG PO TABS
25.0000 mg | ORAL_TABLET | Freq: Once | ORAL | Status: AC
  Administered 2016-01-10: 25 mg via ORAL
  Filled 2016-01-10: qty 1

## 2016-01-10 NOTE — ED Notes (Signed)
Pt updated on plan of care, warm blanket provided,  

## 2016-01-10 NOTE — ED Triage Notes (Signed)
Pt c/o red area with itching noted to left inner knee area that started today,

## 2016-01-10 NOTE — ED Provider Notes (Signed)
[facility] DEPT Provider Note   CSN: [id number] Arrival date & time: 01/10/16  2204  First Provider Contact:  First MD Initiated Contact with Patient 01/10/16 2239     By signing my name below, I, Ephriam Jenkins, attest that this documentation has been prepared under the direction and in the presence of [employer] .  Electronically Signed: Ephriam Jenkins, ED Scribe. 01/10/16. 10:54 PM.   History   Chief Complaint Chief Complaint  Patient presents with  . Leg Pain    HPI HPI Comments: Joyce [name] is a 40 y.o. female who presents to the Emergency Department complaining of a painful, pruritic, gradually worsening insect bite to the inner left thigh; that started today. Pt reports a "burning" sensation at the bite site. Pt reports increased redness in the area. Pt also reports that she has exacerbating pain during ambulation. Pt denies LOC, shortness of breath, facial swelling, difficulty breathing or swallowing. Denies hives or other symptoms.   The history is provided by the patient. No language interpreter was used.    Past Medical History:  Diagnosis Date  . AMA (advanced maternal age) multigravida 35+ 11/12/2014  . Migraine   . Pregnant 11/12/2014  . Round ligament pain 11/12/2014  . Short of breath on exertion 11/12/2014  . Trichomonas infection     Patient Active Problem List   Diagnosis Date Noted  . Rubella non-immune status, antepartum 12/08/2014  . 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 [place] multiparity 11/26/2014  . Trichomonas infection 11/26/2014  . Supervision of normal pregnancy 11/12/2014  . Round ligament pain 11/12/2014  . Short of breath on exertion 11/12/2014    Past Surgical History:  Procedure Laterality Date  . CHOLECYSTECTOMY      OB History    Gravida Para Term Preterm AB Living   11 7 7  0 3 7   SAB TAB Ectopic Multiple Live Births   1     0 7       Home Medications    Prior to Admission  medications   Medication Sig Start Date End Date Taking? Authorizing Provider  diazepam (VALIUM) 2 MG tablet Take 1 tablet (2 mg total) by mouth every 12 (twelve) hours as needed (dizziness). 10/17/15   Evalee Jefferson, PA-C  meclizine (ANTIVERT) 25 MG tablet Take 1 tablet (25 mg total) by mouth 3 (three) times daily as needed for dizziness. 10/17/15   Evalee Jefferson, PA-C  metroNIDAZOLE (FLAGYL) 500 MG tablet Take 1 tablet (500 mg total) by mouth 2 (two) times daily. 10/17/15   Evalee Jefferson, PA-C  naproxen (NAPROSYN) 500 MG tablet Take 1 po BID with food prn pain Patient not taking: Reported on 10/16/2015 08/02/15   Rolland Porter, MD  norethindrone (MICRONOR,CAMILA,ERRIN) 0.35 MG tablet Take 1 tablet (0.35 mg total) by mouth daily. 07/09/15   Christin Fudge, CNM  ondansetron (ZOFRAN) 4 MG tablet Take 1 tablet (4 mg total) by mouth every 6 (six) hours as needed for nausea or vomiting. 01/01/16   Orpah Greek, MD  potassium chloride SA (K-DUR,KLOR-CON) 20 MEQ tablet Take 1 tablet (20 mEq total) by mouth 2 (two) times daily. 08/02/15   Rolland Porter, MD  Prenatal Vit-Fe Fumarate-FA (PRENATAL VITAMIN PLUS LOW IRON) 27-1 MG TABS TAKE ONE TABLET BY MOUTH DAILY AT 12 NOON 12/24/15   Estill Dooms, NP  ranitidine (ZANTAC) 150 MG tablet Take 1 tablet (150 mg total) by mouth 2 (two) times daily. 01/01/16   Gwenyth Allegra  Betsey Holiday, MD    Family History Family History  Problem Relation Age of Onset  . Diabetes Mother   . Hypertension Mother   . Kidney disease Mother   . Alzheimer's disease Father   . Kidney disease Sister   . Asthma Son   . Diabetes Sister   . Asthma Son   . Heart murmur Son     Social History Social History  Substance Use Topics  . Smoking status: Never Smoker  . Smokeless tobacco: Never Used  . Alcohol use No     Allergies   Review of patient's allergies indicates no known allergies.   Review of Systems Review of Systems  Skin: Positive for color change (red) and wound (insect  bite left inner thigh).  All other systems reviewed and are negative.    Physical Exam Updated Vital Signs BP 132/84 (BP Location: Left Arm)   Pulse 119   Temp 97.8 F (36.6 C) (Oral)   Resp 20   Ht 5\' 5"  (1.651 m)   Wt 220 lb (99.8 kg)   LMP 11/02/2015   SpO2 98%   BMI 36.61 kg/m   Physical Exam  Constitutional: She is oriented to person, place, and time. She appears well-developed and well-nourished.  HENT:  Head: Normocephalic and atraumatic.  No facial swelling. Airway patent. No swelling of tongue.   Eyes: Conjunctivae are normal.  Neck: Neck supple.  Cardiovascular: Normal rate and regular rhythm.   Pulmonary/Chest: Effort normal and breath sounds normal.  Symmetrical rise and fall of chest. Speaks in complete sentences  Abdominal: Soft. Bowel sounds are normal.  Musculoskeletal: Normal range of motion.  Neurological: She is alert and oriented to person, place, and time.  Skin: Skin is warm and dry. There is erythema.  Red macular area on the inner aspect of the left thigh. There is a small raised red pimple in the center of the reddened area. She has full ROM of left hip, knee ankle and toes.   Psychiatric: She has a normal mood and affect. Her behavior is normal.  Nursing note and vitals reviewed.  ED Treatments / Results  DIAGNOSTIC STUDIES: Oxygen Saturation is 98% on RA, normal by my interpretation.  COORDINATION OF CARE: 10:40 PM-Will order Benadryl. Discussed treatment plan with pt at bedside and pt agreed to plan.   Labs (all labs ordered are listed, but only abnormal results are displayed) Labs Reviewed - No data to display  EKG  EKG Interpretation None       Radiology No results found.  Procedures Procedures (including critical care time)  Medications Ordered in ED Medications - No data to display   Initial Impression / Assessment and Plan / ED Course  I have reviewed the triage vital signs and the nursing notes.  Pertinent labs &  imaging results that were available during my care of the patient were reviewed by me and considered in my medical decision making (see chart for details).  Clinical Course    **I have reviewed nursing notes, vital signs, and all appropriate lab and imaging results for this patient.*  Final Clinical Impressions(s) / ED Diagnoses  I suspect the patient has an insect bite involving the inner aspect of the knee on the left. There no red streaks appreciated. The vital signs within normal limits. The patient will be treated with Vistaril, Decadron, and Modic. The patient will see the primary physician, or return to the emergency department if any changes in general condition.    Final  diagnoses:  Insect bite    New Prescriptions New Prescriptions   No medications on file  **I personally performed the services described in this documentation, which was scribed in my presence. The recorded information has been reviewed and is accurate.    Lily Kocher, PA-C 01/11/16 0010    Nat Christen, MD 01/11/16 [phone]

## 2016-01-11 MED ORDER — MELOXICAM 15 MG PO TABS: 15.0000 mg | ORAL_TABLET | Freq: Every day | ORAL | 0 refills | Status: DC

## 2016-01-11 MED ORDER — HYDROXYZINE PAMOATE 25 MG PO CAPS: 25.0000 mg | ORAL_CAPSULE | Freq: Four times a day (QID) | ORAL | 0 refills | Status: DC | PRN

## 2016-01-11 MED ORDER — DEXAMETHASONE 4 MG PO TABS: 4.0000 mg | ORAL_TABLET | Freq: Two times a day (BID) | ORAL | 0 refills | Status: DC

## 2016-01-11 NOTE — Discharge Instructions (Signed)
Your vital signs are within normal limits. Please use vistaril for itching and burning. Use decadron and mobic daily with food. Please see your MD or return to the ED if any changes or worsening of your condition.Please soak the affected area of the left leg in warm Epsom Salt bath daily until resolved.

## 2016-01-16 ENCOUNTER — Encounter (HOSPITAL_COMMUNITY): Payer: Self-pay | Admitting: *Deleted

## 2016-01-16 ENCOUNTER — Emergency Department (HOSPITAL_COMMUNITY): Payer: Medicaid Other

## 2016-01-16 ENCOUNTER — Emergency Department (HOSPITAL_COMMUNITY)
Admission: EM | Admit: 2016-01-16 | Discharge: 2016-01-16 | Disposition: A | Discharge status: Home / Self Care | Payer: Medicaid Other | Attending: Emergency Medicine | Admitting: Emergency Medicine

## 2016-01-16 DIAGNOSIS — Z79899 Other long term (current) drug therapy: Secondary | ICD-10-CM | POA: Diagnosis not present

## 2016-01-16 DIAGNOSIS — R1084 Generalized abdominal pain: Secondary | ICD-10-CM | POA: Insufficient documentation

## 2016-01-16 DIAGNOSIS — Z3A14 14 weeks gestation of pregnancy: Secondary | ICD-10-CM | POA: Insufficient documentation

## 2016-01-16 DIAGNOSIS — O26899 Other specified pregnancy related conditions, unspecified trimester: Secondary | ICD-10-CM

## 2016-01-16 DIAGNOSIS — Z791 Long term (current) use of non-steroidal anti-inflammatories (NSAID): Secondary | ICD-10-CM | POA: Diagnosis not present

## 2016-01-16 DIAGNOSIS — O2342 Unspecified infection of urinary tract in pregnancy, second trimester: Secondary | ICD-10-CM | POA: Insufficient documentation

## 2016-01-16 DIAGNOSIS — Z3490 Encounter for supervision of normal pregnancy, unspecified, unspecified trimester: Secondary | ICD-10-CM

## 2016-01-16 DIAGNOSIS — R102 Pelvic and perineal pain: Secondary | ICD-10-CM

## 2016-01-16 DIAGNOSIS — N390 Urinary tract infection, site not specified: Secondary | ICD-10-CM

## 2016-01-16 DIAGNOSIS — O26892 Other specified pregnancy related conditions, second trimester: Secondary | ICD-10-CM | POA: Diagnosis present

## 2016-01-16 LAB — COMPREHENSIVE METABOLIC PANEL
ALT: 22 U/L (ref 14–54)
ANION GAP: 4 — AB (ref 5–15)
AST: 18 U/L (ref 15–41)
Albumin: 3.1 g/dL — ABNORMAL LOW (ref 3.5–5.0)
Alkaline Phosphatase: 38 U/L (ref 38–126)
BUN: 6 mg/dL (ref 6–20)
CHLORIDE: 106 mmol/L (ref 101–111)
CO2: 26 mmol/L (ref 22–32)
Calcium: 8.9 mg/dL (ref 8.9–10.3)
Creatinine, Ser: 0.49 mg/dL (ref 0.44–1.00)
Glucose, Bld: 103 mg/dL — ABNORMAL HIGH (ref 65–99)
POTASSIUM: 4.2 mmol/L (ref 3.5–5.1)
SODIUM: 136 mmol/L (ref 135–145)
Total Bilirubin: 0.4 mg/dL (ref 0.3–1.2)
Total Protein: 6.8 g/dL (ref 6.5–8.1)

## 2016-01-16 LAB — CBC WITH DIFFERENTIAL/PLATELET
Basophils Absolute: 0 10*3/uL (ref 0.0–0.1)
Basophils Relative: 0 %
EOS PCT: 1 %
Eosinophils Absolute: 0.1 10*3/uL (ref 0.0–0.7)
HCT: 39.1 % (ref 36.0–46.0)
HEMOGLOBIN: 13.4 g/dL (ref 12.0–15.0)
LYMPHS ABS: 1.6 10*3/uL (ref 0.7–4.0)
LYMPHS PCT: 17 %
MCH: 26.5 pg (ref 26.0–34.0)
MCHC: 34.3 g/dL (ref 30.0–36.0)
MCV: 77.4 fL — AB (ref 78.0–100.0)
Monocytes Absolute: 0.5 10*3/uL (ref 0.1–1.0)
Monocytes Relative: 5 %
NEUTROS ABS: 7.6 10*3/uL (ref 1.7–7.7)
NEUTROS PCT: 77 %
Platelets: 231 10*3/uL (ref 150–400)
RBC: 5.05 MIL/uL (ref 3.87–5.11)
RDW: 15.2 % (ref 11.5–15.5)
WBC: 9.8 10*3/uL (ref 4.0–10.5)

## 2016-01-16 LAB — HCG, QUANTITATIVE, PREGNANCY: HCG, BETA CHAIN, QUANT, S: 99471 m[IU]/mL — AB (ref <5)

## 2016-01-16 LAB — URINALYSIS, ROUTINE W REFLEX MICROSCOPIC
BILIRUBIN URINE: NEGATIVE
Glucose, UA: NEGATIVE mg/dL
KETONES UR: NEGATIVE mg/dL
NITRITE: NEGATIVE
PROTEIN: NEGATIVE mg/dL
SPECIFIC GRAVITY, URINE: 1.01 (ref 1.005–1.030)
pH: 7.5 (ref 5.0–8.0)

## 2016-01-16 LAB — URINE MICROSCOPIC-ADD ON

## 2016-01-16 LAB — LIPASE, BLOOD: LIPASE: 16 U/L (ref 11–51)

## 2016-01-16 MED ORDER — ONDANSETRON 4 MG PO TBDP
4.0000 mg | ORAL_TABLET | Freq: Once | ORAL | Status: AC
  Administered 2016-01-16: 4 mg via ORAL
  Filled 2016-01-16: qty 1

## 2016-01-16 MED ORDER — ONDANSETRON HCL 4 MG/2ML IJ SOLN: 4.0000 mg | Freq: Once | INTRAMUSCULAR | Status: DC

## 2016-01-16 MED ORDER — CEPHALEXIN 500 MG PO CAPS: 500.0000 mg | ORAL_CAPSULE | Freq: Four times a day (QID) | ORAL | 0 refills | Status: DC

## 2016-01-16 MED ORDER — ACETAMINOPHEN 500 MG PO TABS
1000.0000 mg | ORAL_TABLET | Freq: Once | ORAL | Status: AC
  Administered 2016-01-16: 1000 mg via ORAL
  Filled 2016-01-16: qty 2

## 2016-01-16 NOTE — ED Triage Notes (Signed)
Pt comes in with generalized abdominal pain starting this morning when she got up. Pt states she is having nausea; denies vomiting or diarrhea. Also, pt is pregnant but states she hasn't gone to a doctor. Pt  LMP was on 6/5

## 2016-01-16 NOTE — ED Notes (Signed)
Asked pt if she could give [place] a urine sample. Pt stated she would need something to drink first before she could give [place] a sample.

## 2016-01-16 NOTE — ED Notes (Signed)
Instructed pt to take all of antibiotics as prescribed. 

## 2016-01-16 NOTE — ED Provider Notes (Signed)
[place] DEPT Provider Note   CSN: [id number] Arrival date & time: 01/16/16  0840     History   Chief Complaint Chief Complaint  Patient presents with  . Abdominal Pain    HPI Joyce [name] is a 40 y.o. female.  Patient is a 41 year old female who presents to the emergency department with complaint of diffuse abdominal pain.  The patient states that this problem started earlier this morning. It awakened her from her sleep on. She describes a sharp steady pain that is in her upper and lower abdomen on. It is accompanied by nausea, but no actual vomiting. The patient states she has not had any recent fall or accident or injury to her abdomen. It is of note that her last menstrual cycle was in June 2017, and that she gave delivery to a live birth in December 2016. There's been no dysuria, constipation, or diarrhea. The patient states she has some chills from time to time, but has not measured a temperature elevation. She's had her gallbladder removed, but no other abdominal surgeries to be reported. There has been no recent changes in medication. Patient denies excessive use of aspirin or NSAID products.   The history is provided by the patient.  Abdominal Pain   Associated symptoms include nausea. Pertinent negatives include diarrhea, vomiting, constipation and dysuria.    Past Medical History:  Diagnosis Date  . AMA (advanced maternal age) multigravida 35+ 11/12/2014  . Migraine   . Pregnant 11/12/2014  . Round ligament pain 11/12/2014  . Short of breath on exertion 11/12/2014  . Trichomonas infection     Patient Active Problem List   Diagnosis Date Noted  . Rubella non-immune status, antepartum 12/08/2014  . 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 [place] multiparity 11/26/2014  . Trichomonas infection 11/26/2014  . Supervision of normal pregnancy 11/12/2014  . Round ligament pain 11/12/2014  . Short of breath on exertion 11/12/2014     Past Surgical History:  Procedure Laterality Date  . CHOLECYSTECTOMY      OB History    Gravida Para Term Preterm AB Living   11 7 7  0 3 7   SAB TAB Ectopic Multiple Live Births   1     0 7       Home Medications    Prior to Admission medications   Medication Sig Start Date End Date Taking? Authorizing Provider  dexamethasone (DECADRON) 4 MG tablet Take 1 tablet (4 mg total) by mouth 2 (two) times daily with a meal. 01/11/16   Lily Kocher, PA-C  diazepam (VALIUM) 2 MG tablet Take 1 tablet (2 mg total) by mouth every 12 (twelve) hours as needed (dizziness). 10/17/15   Evalee Jefferson, PA-C  hydrOXYzine (VISTARIL) 25 MG capsule Take 1 capsule (25 mg total) by mouth every 6 (six) hours as needed for itching. 01/11/16   Lily Kocher, PA-C  meclizine (ANTIVERT) 25 MG tablet Take 1 tablet (25 mg total) by mouth 3 (three) times daily as needed for dizziness. 10/17/15   Evalee Jefferson, PA-C  meloxicam (MOBIC) 15 MG tablet Take 1 tablet (15 mg total) by mouth daily. 01/11/16   Lily Kocher, PA-C  metroNIDAZOLE (FLAGYL) 500 MG tablet Take 1 tablet (500 mg total) by mouth 2 (two) times daily. 10/17/15   Evalee Jefferson, PA-C  naproxen (NAPROSYN) 500 MG tablet Take 1 po BID with food prn pain Patient not taking: Reported on 10/16/2015 08/02/15   Rolland Porter, MD  norethindrone [facility])  0.35 MG tablet Take 1 tablet (0.35 mg total) by mouth daily. 07/09/15   Christin Fudge, CNM  ondansetron (ZOFRAN) 4 MG tablet Take 1 tablet (4 mg total) by mouth every 6 (six) hours as needed for nausea or vomiting. 01/01/16   Orpah Greek, MD  potassium chloride SA (K-DUR,KLOR-CON) 20 MEQ tablet Take 1 tablet (20 mEq total) by mouth 2 (two) times daily. 08/02/15   Rolland Porter, MD  Prenatal Vit-Fe Fumarate-FA (PRENATAL VITAMIN PLUS LOW IRON) 27-1 MG TABS TAKE ONE TABLET BY MOUTH DAILY AT 12 NOON 12/24/15   Estill Dooms, NP  ranitidine (ZANTAC) 150 MG tablet Take 1 tablet (150 mg total) by mouth 2 (two)  times daily. 01/01/16   Orpah Greek, MD    Family History Family History  Problem Relation Age of Onset  . Diabetes Mother   . Hypertension Mother   . Kidney disease Mother   . Alzheimer's disease Father   . Kidney disease Sister   . Asthma Son   . Diabetes Sister   . Asthma Son   . Heart murmur Son     Social History Social History  Substance Use Topics  . Smoking status: Never Smoker  . Smokeless tobacco: Never Used  . Alcohol use No     Allergies   Review of patient's allergies indicates no known allergies.   Review of Systems Review of Systems  Gastrointestinal: Positive for abdominal pain and nausea. Negative for constipation, diarrhea and vomiting.  Genitourinary: Negative for dysuria, vaginal bleeding, vaginal discharge and vaginal pain.  All other systems reviewed and are negative.    Physical Exam Updated Vital Signs BP 131/69 (BP Location: Right Arm)   Pulse 88   Temp 98.3 F (36.8 C) (Oral)   Resp 16   Ht 5\' 6"  (1.676 m)   Wt 99.8 kg   LMP 11/02/2015   SpO2 97%   BMI 35.51 kg/m   Physical Exam  Constitutional: Vital signs are normal. She appears well-developed and well-nourished. She is active.  HENT:  Head: Normocephalic and atraumatic.  Right Ear: Tympanic membrane, external ear and ear canal normal.  Left Ear: Tympanic membrane, external ear and ear canal normal.  Nose: Nose normal.  Mouth/Throat: Uvula is midline, oropharynx is clear and moist and mucous membranes are normal.  Eyes: Conjunctivae, EOM and lids are normal. Pupils are equal, round, and reactive to light. No scleral icterus.  Neck: Trachea normal, normal range of motion and phonation normal. Neck supple. Carotid bruit is not present.  Cardiovascular: Normal rate, regular rhythm and normal pulses.   Abdominal: Soft. Normal appearance and bowel sounds are normal. She exhibits no distension and no mass. There is tenderness. There is guarding.  Diffuse abdominal pain to  palpation on. No splenomegaly, no hepatomegaly appreciated. No mass appreciated. No pulsatile mass noted.  Lymphadenopathy:       Head (right side): No submental, no preauricular and no posterior auricular adenopathy present.       Head (left side): No submental, no preauricular and no posterior auricular adenopathy present.    She has no cervical adenopathy.  Neurological: She is alert. She has normal strength. No cranial nerve deficit or sensory deficit. GCS eye subscore is 4. GCS verbal subscore is 5. GCS motor subscore is 6.  Skin: Skin is warm and dry.  Psychiatric: Her speech is normal.     ED Treatments / Results  Labs (all labs ordered are listed, but only abnormal results are displayed)  Labs Reviewed  COMPREHENSIVE METABOLIC PANEL  LIPASE, BLOOD  CBC WITH DIFFERENTIAL/PLATELET  URINALYSIS, ROUTINE W REFLEX MICROSCOPIC (NOT AT [facility])  HCG, QUANTITATIVE, PREGNANCY    EKG  EKG Interpretation None       Radiology No results found.  Procedures Procedures (including critical care time)  Medications Ordered in ED Medications - No data to display   Initial Impression / Assessment and Plan / ED Course  I have reviewed the triage vital signs and the nursing notes.  Pertinent labs & imaging results that were available during my care of the patient were reviewed by me and considered in my medical decision making (see chart for details).  Clinical Course  Value Comment By Time  HCG, Beta Chain, Quant, S: (!) [phone] (Reviewed) Lily Kocher, PA-C 08/19 1309    **I have reviewed nursing notes, vital signs, and all appropriate lab and imaging results for this patient.  Final Clinical Impressions(s) / ED Diagnoses  Vital signs within normal limits. Pulse oximetry is 100% on, within normal limits by my interpretation. Urinalysis reveals a clear yellow specimen with a specific gravity 1.010, there is a trace of hemoglobin, there is a small leukocyte esterase, there are 60-30  white blood cells per high-powered field. A culture of the urine will be sent to the lab on. Patient will be placed on not Keflex. The quantitative hCG is 99,000 1471. It is of note that this is lower than the one done 13 days ago at which time it was 135,000. The lipase is normal at 16. The complete blood count is normal.  You abdominal ultrasound shows the gallbladder to be absent. Portions of the pancreas are noted be obscured by gas. The visualized portion of the pancreas appear normal. The remainder the study is unremarkable. The OB comprehensive ultrasound OB complete ultrasound reveals a fetal heart rate of 171. There is a single live intrauterine on fetus present the gestational age is 89 weeks 5 days by ultrasound.  I discussed with the patient the findings of today's studies and examination. I've asked the patient to see the GYN specialist as sone as possible concerning the change in the quantitative hCG. Patient is to follow-up at the [facility] hospital, or return to the emergency department if any emergent changes, problems, or concerns before she is seen by the OBGYN specialist.  Patient was ambulatory at the time of discharge. The pain and nausea have improved on. Patient is in agreement with the discharge plan.    Final diagnoses:  None    New Prescriptions New Prescriptions   No medications on file     Lily Kocher, PA-C 01/16/16 [place], DO 01/19/16 1515

## 2016-01-16 NOTE — Discharge Instructions (Signed)
Your quantitative pregnancy tests is lower today than it was about 13 days ago. Please have Dr.Eure or a member of his team check this as soon as possible. Your urine test questions a urinary tract infection. A culture has been sent to the lab, please use Keflex daily until all taken.

## 2016-01-16 NOTE — ED Notes (Signed)
Pt to ultrasound

## 2016-01-16 NOTE — ED Notes (Signed)
Unable to urinate at this time.  

## 2016-01-18 LAB — URINE CULTURE: SPECIAL REQUESTS: NORMAL

## 2016-01-19 ENCOUNTER — Ambulatory Visit (INDEPENDENT_AMBULATORY_CARE_PROVIDER_SITE_OTHER): Payer: Medicaid Other | Admitting: Women's Health

## 2016-01-19 ENCOUNTER — Encounter: Payer: Self-pay | Admitting: Women's Health

## 2016-01-19 DIAGNOSIS — R1084 Generalized abdominal pain: Secondary | ICD-10-CM

## 2016-01-19 DIAGNOSIS — Z3490 Encounter for supervision of normal pregnancy, unspecified, unspecified trimester: Secondary | ICD-10-CM

## 2016-01-19 DIAGNOSIS — R110 Nausea: Secondary | ICD-10-CM | POA: Diagnosis not present

## 2016-01-19 NOTE — Progress Notes (Signed)
   [facility] Visit  Patient name: Joyce [name] MRN [id number]  Date of birth: [birth year]  CC & HPI:  Joyce [name] is a 40 y.o. [id number] African American female presenting today for f/u from ED. Was recently told she was pregnant- was not planning pregnancy, missed a few pills.Had been to ED on 8/3 w/ + HCG but said no one told her she was pregnant at that visit. Returned on  8/19 and HCG had dropped from last visit, but had normal u/s which revealed viable 14wk size fetus w/ EDC of 07/14/15. No n/v, cramping, vb. Taking pnv.   Patient's last menstrual period was 11/02/2015. Pertinent History Reviewed:  Medical & Surgical Hx:   Past medical, surgical, family, and social history reviewed in electronic medical record Medications: Reviewed & Updated - see associated section Allergies: Reviewed in electronic medical record  Objective Findings:  Vitals: BP 124/78 (BP Location: Right Arm, Patient Position: Sitting, Cuff Size: Large)   Pulse 92   Ht 5\' 5"  (1.651 m)   Wt 228 lb (103.4 kg)   LMP 11/02/2015   BMI 37.94 kg/m  Body mass index is 37.94 kg/m.  Physical Examination: General appearance - alert, well appearing, and in no distress  No results found for this or any previous visit (from the past 24 hour(s)).   Assessment & Plan:  A:   [redacted]w[redacted]d SIUP  P:  Continue pnv  Return in about 1 week (around 01/26/2016) for new ob visit.  Tawnya Crook CNM, [facility] 01/19/2016 5:10 PM

## 2016-01-19 NOTE — Patient Instructions (Signed)

## 2016-01-27 ENCOUNTER — Encounter: Payer: Self-pay | Admitting: Women's Health

## 2016-01-27 ENCOUNTER — Other Ambulatory Visit (HOSPITAL_COMMUNITY)
Admission: RE | Admit: 2016-01-27 | Discharge: 2016-01-27 | Disposition: A | Discharge status: Home / Self Care | Payer: Medicaid Other | Source: Ambulatory Visit | Attending: Obstetrics & Gynecology | Admitting: Obstetrics & Gynecology

## 2016-01-27 ENCOUNTER — Ambulatory Visit (INDEPENDENT_AMBULATORY_CARE_PROVIDER_SITE_OTHER): Payer: Medicaid Other | Admitting: Women's Health

## 2016-01-27 VITALS — BP 130/78 | HR 64 | Wt 229.0 lb

## 2016-01-27 DIAGNOSIS — O3410 Maternal care for benign tumor of corpus uteri, unspecified trimester: Secondary | ICD-10-CM

## 2016-01-27 DIAGNOSIS — D259 Leiomyoma of uterus, unspecified: Secondary | ICD-10-CM

## 2016-01-27 DIAGNOSIS — Z01419 Encounter for gynecological examination (general) (routine) without abnormal findings: Secondary | ICD-10-CM | POA: Diagnosis present

## 2016-01-27 DIAGNOSIS — Z0283 Encounter for blood-alcohol and blood-drug test: Secondary | ICD-10-CM

## 2016-01-27 DIAGNOSIS — Z1389 Encounter for screening for other disorder: Secondary | ICD-10-CM | POA: Diagnosis not present

## 2016-01-27 DIAGNOSIS — Z1151 Encounter for screening for human papillomavirus (HPV): Secondary | ICD-10-CM | POA: Insufficient documentation

## 2016-01-27 DIAGNOSIS — O0942 Supervision of pregnancy with grand multiparity, second trimester: Secondary | ICD-10-CM

## 2016-01-27 DIAGNOSIS — Z3482 Encounter for supervision of other normal pregnancy, second trimester: Secondary | ICD-10-CM

## 2016-01-27 DIAGNOSIS — Z363 Encounter for antenatal screening for malformations: Secondary | ICD-10-CM

## 2016-01-27 DIAGNOSIS — Z3A16 16 weeks gestation of pregnancy: Secondary | ICD-10-CM | POA: Diagnosis not present

## 2016-01-27 DIAGNOSIS — Z6838 Body mass index (BMI) 38.0-38.9, adult: Secondary | ICD-10-CM

## 2016-01-27 DIAGNOSIS — O09522 Supervision of elderly multigravida, second trimester: Secondary | ICD-10-CM | POA: Diagnosis not present

## 2016-01-27 DIAGNOSIS — Z3490 Encounter for supervision of normal pregnancy, unspecified, unspecified trimester: Secondary | ICD-10-CM | POA: Insufficient documentation

## 2016-01-27 DIAGNOSIS — Z113 Encounter for screening for infections with a predominantly sexual mode of transmission: Secondary | ICD-10-CM | POA: Insufficient documentation

## 2016-01-27 DIAGNOSIS — O99212 Obesity complicating pregnancy, second trimester: Secondary | ICD-10-CM | POA: Diagnosis not present

## 2016-01-27 DIAGNOSIS — Z331 Pregnant state, incidental: Secondary | ICD-10-CM | POA: Diagnosis not present

## 2016-01-27 DIAGNOSIS — Z3492 Encounter for supervision of normal pregnancy, unspecified, second trimester: Secondary | ICD-10-CM

## 2016-01-27 DIAGNOSIS — Z369 Encounter for antenatal screening, unspecified: Secondary | ICD-10-CM

## 2016-01-27 LAB — POCT URINALYSIS DIPSTICK
Glucose, UA: NEGATIVE
KETONES UA: NEGATIVE
Leukocytes, UA: NEGATIVE
Nitrite, UA: NEGATIVE
PROTEIN UA: NEGATIVE
RBC UA: NEGATIVE

## 2016-01-27 NOTE — Progress Notes (Signed)
Subjective:  Joyce [name] is a 40 y.o. [id number] African American female at [redacted]w[redacted]d by 14wk u/s in ED on 8/19, being seen today for her first obstetrical visit.  Her obstetrical history is significant for grand multiparity, AMA, reports bp up & down last pregnancy- no mention of this on H&P or D/C summary for last delivery. On ED u/s they noted 6.1x4.3x5.1 uterine fibroid. Pregnancy history fully reviewed.  Patient reports no complaints. Denies vb, cramping, uti s/s, abnormal/malodorous vag d/c, or vulvovaginal itching/irritation.  BP 130/78   Pulse 64   Wt 229 lb (103.9 kg)   LMP 11/02/2015   BMI 38.11 kg/m   HISTORY: OB History  Gravida Para Term Preterm AB Living  11 7 7  0 3 7  SAB TAB Ectopic Multiple Live Births  1 2   0 7    # Outcome Date GA Lbr Len/2nd Weight Sex Delivery Anes PTL Lv  11 Current           10 Term 05/26/15 [redacted]w[redacted]d 10:29 / 00:07 6 lb 2 oz (2.778 kg) M Vag-Spont EPI N LIV  9 Term 05/25/06 [redacted]w[redacted]d  6 lb 2 oz (2.778 kg) M Vag-Spont EPI N LIV  8 Term 01/24/99 [redacted]w[redacted]d  6 lb 6 oz (2.892 kg) M Vag-Spont None N LIV  7 Term 08/05/97 [redacted]w[redacted]d  6 lb (2.722 kg) M Vag-Spont None N LIV  6 Term 12/04/95 [redacted]w[redacted]d  6 lb 2 oz (2.778 kg) F Vag-Spont EPI N LIV  5 Term 11/23/93 [redacted]w[redacted]d  6 lb 4 oz (2.835 kg) F Vag-Spont None N LIV  4 Term 10/07/92 [redacted]w[redacted]d  6 lb 2 oz (2.778 kg) M Vag-Spont EPI N LIV  3 TAB           2 SAB           1 TAB              Past Medical History:  Diagnosis Date  . AMA (advanced maternal age) multigravida 35+ 11/12/2014  . Migraine   . Pregnant 11/12/2014  . Round ligament pain 11/12/2014  . Short of breath on exertion 11/12/2014  . Trichomonas infection    Past Surgical History:  Procedure Laterality Date  . CHOLECYSTECTOMY     Family History  Problem Relation Age of Onset  . Diabetes Mother   . Hypertension Mother   . Kidney disease Mother   . Alzheimer's disease Father   . Kidney disease Sister   . Asthma Son   . Diabetes Sister   . Asthma Son   .  Heart murmur Son   . Asthma Son     Exam   System:     General: Well developed & nourished, no acute distress   Skin: Warm & dry, normal coloration and turgor, no rashes   Neurologic: Alert & oriented, normal mood   Cardiovascular: Regular rate & rhythm   Respiratory: Effort & rate normal, LCTAB, acyanotic   Abdomen: Soft, non tender   Extremities: normal strength, tone   Pelvic Exam:    Perineum: Normal perineum   Vulva: Normal, no lesions   Vagina:  Normal mucosa, normal discharge   Cervix: Normal, bulbous, appears closed   Uterus: Normal size/shape/contour for GA   Thin prep pap smear obtained w/ high risk HPV cotesting FHR: 145 via doppler   Assessment:   Pregnancy: [id number] Patient Active Problem List   Diagnosis Date Noted  . Supervision of normal pregnancy 11/12/2014    Priority: High  .  Pregnant 01/19/2016  . Rubella non-immune status, antepartum 12/08/2014  . 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 [place] multiparity 11/26/2014  . Trichomonas infection 11/26/2014  . Round ligament pain 11/12/2014  . Short of breath on exertion 11/12/2014    Unknown [id number] New OB visit Grand multip AMA H/O +HRHPV pap Uterine fibroid  Plan:  Initial labs drawn Continue prenatal vitamins Problem list reviewed and updated Reviewed n/v relief measures and warning s/s to report Reviewed recommended weight gain based on pre-gravid BMI Encouraged well-balanced diet Genetic Screening discussed Quad Screen: requested Cystic fibrosis screening discussed results reviewed Ultrasound discussed; fetal survey: requested Follow up in 4 weeks for visit and anatomy u/s CCNC completed Baby ASA daily (obese, AMA, poss elevated bp last pregnancy?)  Tawnya Crook CNM, [facility] 01/27/2016 4:01 PM

## 2016-01-27 NOTE — Patient Instructions (Addendum)
Begin taking a 81mg baby aspirin daily to decrease risk of preeclampsia during pregnancy   Second Trimester of Pregnancy The second trimester is from week 13 through week 28, months 4 through 6. The second trimester is often a time when you feel your best. Your body has also adjusted to being pregnant, and you begin to feel better physically. Usually, morning sickness has lessened or quit completely, you may have more energy, and you may have an increase in appetite. The second trimester is also a time when the fetus is growing rapidly. At the end of the sixth month, the fetus is about 9 inches long and weighs about 1 pounds. You will likely begin to feel the baby move (quickening) between 18 and 20 weeks of the pregnancy. BODY CHANGES Your body goes through many changes during pregnancy. The changes vary from woman to woman.   Your weight will continue to increase. You will notice your lower abdomen bulging out.  You may begin to get stretch marks on your hips, abdomen, and breasts.  You may develop headaches that can be relieved by medicines approved by your health care provider.  You may urinate more often because the fetus is pressing on your bladder.  You may develop or continue to have heartburn as a result of your pregnancy.  You may develop constipation because certain hormones are causing the muscles that push waste through your intestines to slow down.  You may develop hemorrhoids or swollen, bulging veins (varicose veins).  You may have back pain because of the weight gain and pregnancy hormones relaxing your joints between the bones in your pelvis and as a result of a shift in weight and the muscles that support your balance.  Your breasts will continue to grow and be tender.  Your gums may bleed and may be sensitive to brushing and flossing.  Dark spots or blotches (chloasma, mask of pregnancy) may develop on your face. This will likely fade after the baby is born.  A dark  line from your belly button to the pubic area (linea nigra) may appear. This will likely fade after the baby is born.  You may have changes in your hair. These can include thickening of your hair, rapid growth, and changes in texture. Some women also have hair loss during or after pregnancy, or hair that feels dry or thin. Your hair will most likely return to normal after your baby is born. WHAT TO EXPECT AT YOUR PRENATAL VISITS During a routine prenatal visit:  You will be weighed to make sure you and the fetus are growing normally.  Your blood pressure will be taken.  Your abdomen will be measured to track your baby's growth.  The fetal heartbeat will be listened to.  Any test results from the previous visit will be discussed. Your health care provider may ask you:  How you are feeling.  If you are feeling the baby move.  If you have had any abnormal symptoms, such as leaking fluid, bleeding, severe headaches, or abdominal cramping.  If you are using any tobacco products, including cigarettes, chewing tobacco, and electronic cigarettes.  If you have any questions. Other tests that may be performed during your second trimester include:  Blood tests that check for:  Low iron levels (anemia).  Gestational diabetes (between 24 and 28 weeks).  Rh antibodies.  Urine tests to check for infections, diabetes, or protein in the urine.  An ultrasound to confirm the proper growth and development of the   baby.  An amniocentesis to check for possible genetic problems.  Fetal screens for spina bifida and Down syndrome.  HIV (human immunodeficiency virus) testing. Routine prenatal testing includes screening for HIV, unless you choose not to have this test. HOME CARE INSTRUCTIONS   Avoid all smoking, herbs, alcohol, and unprescribed drugs. These chemicals affect the formation and growth of the baby.  Do not use any tobacco products, including cigarettes, chewing tobacco, and  electronic cigarettes. If you need help quitting, ask your health care provider. You may receive counseling support and other resources to help you quit.  Follow your health care provider's instructions regarding medicine use. There are medicines that are either safe or unsafe to take during pregnancy.  Exercise only as directed by your health care provider. Experiencing uterine cramps is a good sign to stop exercising.  Continue to eat regular, healthy meals.  Wear a good support bra for breast tenderness.  Do not use hot tubs, steam rooms, or saunas.  Wear your seat belt at all times when driving.  Avoid raw meat, uncooked cheese, cat litter boxes, and soil used by cats. These carry germs that can cause birth defects in the baby.  Take your prenatal vitamins.  Take 1500-2000 mg of calcium daily starting at the 20th week of pregnancy until you deliver your baby.  Try taking a stool softener (if your health care provider approves) if you develop constipation. Eat more high-fiber foods, such as fresh vegetables or fruit and whole grains. Drink plenty of fluids to keep your urine clear or pale yellow.  Take warm sitz baths to soothe any pain or discomfort caused by hemorrhoids. Use hemorrhoid cream if your health care provider approves.  If you develop varicose veins, wear support hose. Elevate your feet for 15 minutes, 3-4 times a day. Limit salt in your diet.  Avoid heavy lifting, wear low heel shoes, and practice good posture.  Rest with your legs elevated if you have leg cramps or low back pain.  Visit your dentist if you have not gone yet during your pregnancy. Use a soft toothbrush to brush your teeth and be gentle when you floss.  A sexual relationship may be continued unless your health care provider directs you otherwise.  Continue to go to all your prenatal visits as directed by your health care provider. SEEK MEDICAL CARE IF:   You have dizziness.  You have mild pelvic  cramps, pelvic pressure, or nagging pain in the abdominal area.  You have persistent nausea, vomiting, or diarrhea.  You have a bad smelling vaginal discharge.  You have pain with urination. SEEK IMMEDIATE MEDICAL CARE IF:   You have a fever.  You are leaking fluid from your vagina.  You have spotting or bleeding from your vagina.  You have severe abdominal cramping or pain.  You have rapid weight gain or loss.  You have shortness of breath with chest pain.  You notice sudden or extreme swelling of your face, hands, ankles, feet, or legs.  You have not felt your baby move in over an hour.  You have severe headaches that do not go away with medicine.  You have vision changes.   This information is not intended to replace advice given to you by your health care provider. Make sure you discuss any questions you have with your health care provider.   Document Released: 05/10/2001 Document Revised: 06/06/2014 Document Reviewed: 07/17/2012 Elsevier Interactive Patient Education 2016 Elsevier Inc.   

## 2016-01-28 LAB — URINE CULTURE: Organism ID, Bacteria: NO GROWTH

## 2016-01-29 LAB — URINALYSIS, ROUTINE W REFLEX MICROSCOPIC
Bilirubin, UA: NEGATIVE
GLUCOSE, UA: NEGATIVE
KETONES UA: NEGATIVE
Nitrite, UA: NEGATIVE
Protein, UA: NEGATIVE
RBC, UA: NEGATIVE
SPEC GRAV UA: 1.014 (ref 1.005–1.030)
Urobilinogen, Ur: 0.2 mg/dL (ref 0.2–1.0)
pH, UA: 7 (ref 5.0–7.5)

## 2016-01-29 LAB — ABO/RH: RH TYPE: POSITIVE

## 2016-01-29 LAB — PMP SCREEN PROFILE (10S), URINE
Amphetamine Screen, Ur: NEGATIVE ng/mL
Barbiturate Screen, Ur: NEGATIVE ng/mL
Benzodiazepine Screen, Urine: NEGATIVE ng/mL
COCAINE(METAB.) SCREEN, URINE: NEGATIVE ng/mL
CREATININE(CRT), U: 88.1 mg/dL (ref 20.0–300.0)
Cannabinoids Ur Ql Scn: NEGATIVE ng/mL
METHADONE SCREEN, URINE: NEGATIVE ng/mL
OPIATE SCRN UR: NEGATIVE ng/mL
OXYCODONE+OXYMORPHONE UR QL SCN: NEGATIVE ng/mL
PCP Scrn, Ur: NEGATIVE ng/mL
PROPOXYPHENE SCREEN: NEGATIVE ng/mL
Ph of Urine: 6.7 (ref 4.5–8.9)

## 2016-01-29 LAB — MICROSCOPIC EXAMINATION: Casts: NONE SEEN /lpf

## 2016-01-29 LAB — CBC
HEMATOCRIT: 40 % (ref 34.0–46.6)
HEMOGLOBIN: 13.5 g/dL (ref 11.1–15.9)
MCH: 26 pg — AB (ref 26.6–33.0)
MCHC: 33.8 g/dL (ref 31.5–35.7)
MCV: 77 fL — AB (ref 79–97)
Platelets: 246 10*3/uL (ref 150–379)
RBC: 5.2 x10E6/uL (ref 3.77–5.28)
RDW: 15.8 % — ABNORMAL HIGH (ref 12.3–15.4)
WBC: 10.3 10*3/uL (ref 3.4–10.8)

## 2016-01-29 LAB — RPR: RPR: NONREACTIVE

## 2016-01-29 LAB — HIV ANTIBODY (ROUTINE TESTING W REFLEX): HIV Screen 4th Generation wRfx: NONREACTIVE

## 2016-01-29 LAB — HEPATITIS B SURFACE ANTIGEN: Hepatitis B Surface Ag: NEGATIVE

## 2016-01-29 LAB — VARICELLA ZOSTER ANTIBODY, IGG: Varicella zoster IgG: 712 index (ref >165)

## 2016-01-29 LAB — RUBELLA SCREEN: RUBELLA: 2.4 {index} (ref >0.99)

## 2016-01-29 LAB — CYTOLOGY - PAP

## 2016-01-29 LAB — ANTIBODY SCREEN: ANTIBODY SCREEN: NEGATIVE

## 2016-02-02 ENCOUNTER — Telehealth: Payer: Self-pay | Admitting: *Deleted

## 2016-02-02 ENCOUNTER — Telehealth: Payer: Self-pay | Admitting: Women's Health

## 2016-02-02 DIAGNOSIS — A599 Trichomoniasis, unspecified: Secondary | ICD-10-CM

## 2016-02-02 MED ORDER — METRONIDAZOLE 500 MG PO TABS: 500.0000 mg | ORAL_TABLET | Freq: Two times a day (BID) | ORAL | 0 refills | Status: DC

## 2016-02-02 NOTE — Telephone Encounter (Signed)
Pt notified of results and reccomedations from [facility].  Pt's partner's name is Fransisco Beau DOB: 08-09-1976.  He uses [facility] in [place].  He does not have any allergies (per [facility]).  02-02-16  AS

## 2016-02-02 NOTE — Telephone Encounter (Signed)
LM for pt to return call. Need to notify of +trich, rx for metronidazole 500mg  BID x 7d sent to pharmacy. Need partners info (name, dob, allergies, pharmacy) to treat him. No sex until both finished w/ meds.  Roma Schanz, CNM, WHNP-BC 02/02/2016 1:22 PM

## 2016-02-03 NOTE — Assessment & Plan Note (Signed)
8

## 2016-02-03 NOTE — Telephone Encounter (Signed)
Pt notified of +trich, rx metronidazole 500mg  BID x 7d for pt and partner Fransisco Beau, dob: [birth year], nkda to [facility] [place]. No sex/etoh while taking med. Then no sex x 1wk after both finished w/ tx. POC for pt at next visit.  Roma Schanz, CNM, [facility] 02/03/2016 9:29 AM

## 2016-02-24 ENCOUNTER — Encounter: Payer: Self-pay | Admitting: Advanced Practice Midwife

## 2016-02-24 ENCOUNTER — Ambulatory Visit (INDEPENDENT_AMBULATORY_CARE_PROVIDER_SITE_OTHER): Payer: Medicaid Other

## 2016-02-24 ENCOUNTER — Ambulatory Visit (INDEPENDENT_AMBULATORY_CARE_PROVIDER_SITE_OTHER): Payer: Medicaid Other | Admitting: Advanced Practice Midwife

## 2016-02-24 VITALS — BP 124/78 | HR 86 | Wt 228.0 lb

## 2016-02-24 DIAGNOSIS — Z3492 Encounter for supervision of normal pregnancy, unspecified, second trimester: Secondary | ICD-10-CM | POA: Diagnosis not present

## 2016-02-24 DIAGNOSIS — Z331 Pregnant state, incidental: Secondary | ICD-10-CM | POA: Diagnosis not present

## 2016-02-24 DIAGNOSIS — D259 Leiomyoma of uterus, unspecified: Secondary | ICD-10-CM

## 2016-02-24 DIAGNOSIS — O23592 Infection of other part of genital tract in pregnancy, second trimester: Secondary | ICD-10-CM

## 2016-02-24 DIAGNOSIS — O09522 Supervision of elderly multigravida, second trimester: Secondary | ICD-10-CM

## 2016-02-24 DIAGNOSIS — A599 Trichomoniasis, unspecified: Secondary | ICD-10-CM | POA: Diagnosis not present

## 2016-02-24 DIAGNOSIS — O359XX1 Maternal care for (suspected) fetal abnormality and damage, unspecified, fetus 1: Secondary | ICD-10-CM | POA: Diagnosis not present

## 2016-02-24 DIAGNOSIS — Z3A20 20 weeks gestation of pregnancy: Secondary | ICD-10-CM

## 2016-02-24 DIAGNOSIS — Z363 Encounter for antenatal screening for malformations: Secondary | ICD-10-CM

## 2016-02-24 DIAGNOSIS — Z3682 Encounter for antenatal screening for nuchal translucency: Secondary | ICD-10-CM

## 2016-02-24 DIAGNOSIS — Z1389 Encounter for screening for other disorder: Secondary | ICD-10-CM | POA: Diagnosis not present

## 2016-02-24 DIAGNOSIS — Z3A28 28 weeks gestation of pregnancy: Secondary | ICD-10-CM

## 2016-02-24 DIAGNOSIS — O3410 Maternal care for benign tumor of corpus uteri, unspecified trimester: Secondary | ICD-10-CM

## 2016-02-24 LAB — POCT URINALYSIS DIPSTICK
Blood, UA: NEGATIVE
Glucose, UA: NEGATIVE
Ketones, UA: NEGATIVE
Leukocytes, UA: NEGATIVE
Nitrite, UA: NEGATIVE

## 2016-02-24 MED ORDER — OMEPRAZOLE 20 MG PO CPDR: 20.0000 mg | DELAYED_RELEASE_CAPSULE | Freq: Every day | ORAL | 6 refills | Status: DC

## 2016-02-24 NOTE — Progress Notes (Addendum)
[place] 20 wks,cephalic,ant pl gr 0, svp of fluid 5.2 cm,cx 5.2 cm,fhr 147 bpm,two LV EICF ( #1)1.8 mm,(#2) 2.2 mm,efw 336 g,anatomy complete,fibroid was not visualized

## 2016-02-24 NOTE — Progress Notes (Signed)
[id number] [redacted]w[redacted]d Estimated Date of Delivery: 07/13/16  Blood pressure 124/78, pulse 86, weight 228 lb (103.4 kg), last menstrual period 11/02/2015, not currently breastfeeding.   BP weight and urine results all reviewed and noted.  Please refer to the obstetrical flow sheet for the fundal height and fetal heart rate documentation:  Patient reports good fetal movement, denies any bleeding and no rupture of membranes symptoms or regular contractions. Patient has a lot of GERD.  Stress incontinence. Discussed Kegals All questions were answered.  Orders Placed This Encounter  Procedures  . Trichomonas vaginalis, RNA  . AFP, Quad Screen  . POCT urinalysis dipstick    Plan:  Continued routine obstetrical care, POC Trich.  Rx prilosec. AFP toay  Return in about 4 weeks (around 03/23/2016) for LROB.

## 2016-02-27 LAB — AFP, QUAD SCREEN
DIA MOM VALUE: 0.94
DIA Value (EIA): 148.65 pg/mL
DSR (BY AGE) 1 IN: 97
DSR (SECOND TRIMESTER) 1 IN: 75
Gestational Age: 20 WEEKS
MATERNAL AGE AT EDD: 39.6 a
MSAFP Mom: 0.82
MSAFP: 39.7 ng/mL
MSHCG Mom: 1.55
MSHCG: 28248 m[IU]/mL
OSB RISK: 10000
T18 (By Age): 1:380 {titer}
TEST RESULTS AFP: POSITIVE — AB
UE3 VALUE: 0.97 ng/mL
Weight: 228 [lb_av]
uE3 Mom: 0.58

## 2016-02-29 LAB — TRICHOMONAS VAGINALIS, PROBE AMP: TRICH VAG BY NAA: NEGATIVE

## 2016-03-02 ENCOUNTER — Telehealth: Payer: Self-pay | Admitting: *Deleted

## 2016-03-02 ENCOUNTER — Other Ambulatory Visit: Payer: Self-pay | Admitting: Advanced Practice Midwife

## 2016-03-02 DIAGNOSIS — O280 Abnormal hematological finding on antenatal screening of mother: Secondary | ICD-10-CM

## 2016-03-02 NOTE — Telephone Encounter (Signed)
Labs results received from [facility]. Please review

## 2016-03-02 NOTE — Progress Notes (Signed)
Pt informed of 1:75 DSR.  Informasique ordered.

## 2016-03-23 ENCOUNTER — Encounter: Payer: Self-pay | Admitting: *Deleted

## 2016-03-23 ENCOUNTER — Encounter: Payer: Medicaid Other | Admitting: Advanced Practice Midwife

## 2016-03-30 ENCOUNTER — Ambulatory Visit (INDEPENDENT_AMBULATORY_CARE_PROVIDER_SITE_OTHER): Payer: Medicaid Other | Admitting: Obstetrics and Gynecology

## 2016-03-30 VITALS — BP 136/72 | HR 88 | Wt 217.8 lb

## 2016-03-30 DIAGNOSIS — Z3493 Encounter for supervision of normal pregnancy, unspecified, third trimester: Secondary | ICD-10-CM

## 2016-03-30 DIAGNOSIS — Z3482 Encounter for supervision of other normal pregnancy, second trimester: Secondary | ICD-10-CM | POA: Diagnosis not present

## 2016-03-30 DIAGNOSIS — O280 Abnormal hematological finding on antenatal screening of mother: Secondary | ICD-10-CM

## 2016-03-30 DIAGNOSIS — Z3A25 25 weeks gestation of pregnancy: Secondary | ICD-10-CM | POA: Diagnosis not present

## 2016-03-30 DIAGNOSIS — Z331 Pregnant state, incidental: Secondary | ICD-10-CM

## 2016-03-30 DIAGNOSIS — O09523 Supervision of elderly multigravida, third trimester: Secondary | ICD-10-CM

## 2016-03-30 DIAGNOSIS — Z1389 Encounter for screening for other disorder: Secondary | ICD-10-CM

## 2016-03-30 DIAGNOSIS — A599 Trichomoniasis, unspecified: Secondary | ICD-10-CM | POA: Diagnosis not present

## 2016-03-30 LAB — POCT URINALYSIS DIPSTICK
Glucose, UA: NEGATIVE
Leukocytes, UA: NEGATIVE
Nitrite, UA: NEGATIVE
RBC UA: NEGATIVE

## 2016-03-30 NOTE — Progress Notes (Signed)
[id number]  Estimated Date of Delivery: 07/13/16 LROB [redacted]w[redacted]d  Blood pressure 136/72, pulse 88, weight 217 lb 12.8 oz (98.8 kg), last menstrual period 11/02/2015, not currently breastfeeding.   Urine results:notable for large ketones, trace protein  Chief Complaint  Patient presents with  . Routine Prenatal Visit    c/o vaginal irritation and discharge    Patient complaints: vaginal irritation and slight discharge. She had trich on pap smear from 01/27/16; she and partner were treated. She has not been seen for proof of care yet.  Patient reports   good fetal movement,                           denies any bleeding , rupture of membranes,or regular contractions.   refer to the ob flow sheet for FH and FHR, ,                          Physical Examination: General appearance - alert, well appearing, and in no distress                                      Abdomen - FH 28 ,                                                         -FHR 145                                                         soft, nontender, nondistended, no masses or organomegaly                                      Pelvic - VULVA: normal appearing vulva with no masses, tenderness or lesions, VAGINA: normal appearing vagina with normal color and discharge, no lesions  Patient given order form informasique                                           Questions were answered. Assessment: LROB [id number] @ [redacted]w[redacted]d, negative for trich.  Estimated Date of Delivery: 07/13/16   Plan:  Continued routine obstetrical care,   F/u in 3 weeks for prenatal 2 labs    By signing my name below, I, Joyce [name], attest that this documentation has been prepared under the direction and in the presence of Mallory Shirk, MD. Electronically Signed: Sonum [name], Education administrator. 03/30/16. 4:16 PM.  I personally performed the services described in this documentation, which was SCRIBED in my presence. The recorded information has been reviewed and considered  accurate. It has been edited as necessary during review. Jonnie Kind, MD

## 2016-04-05 LAB — INFORMASEQ(SM) PRENATAL TEST
FETAL FRACTION (%): 20.5
FETAL NUMBER: 1
GESTATIONAL AGE AT COLLECTION: 25 wk
Weight: 217 [lb_av]

## 2016-04-06 ENCOUNTER — Telehealth: Payer: Self-pay | Admitting: *Deleted

## 2016-04-07 NOTE — Telephone Encounter (Signed)
Spoke with patient to let her know genetic testing was normal.No other questions.

## 2016-04-20 ENCOUNTER — Ambulatory Visit (INDEPENDENT_AMBULATORY_CARE_PROVIDER_SITE_OTHER): Payer: Medicaid Other | Admitting: Obstetrics and Gynecology

## 2016-04-20 ENCOUNTER — Other Ambulatory Visit: Payer: Medicaid Other

## 2016-04-20 ENCOUNTER — Encounter: Payer: Self-pay | Admitting: Obstetrics and Gynecology

## 2016-04-20 VITALS — BP 142/82 | HR 76 | Wt 222.8 lb

## 2016-04-20 DIAGNOSIS — Z1389 Encounter for screening for other disorder: Secondary | ICD-10-CM

## 2016-04-20 DIAGNOSIS — O3412 Maternal care for benign tumor of corpus uteri, second trimester: Secondary | ICD-10-CM

## 2016-04-20 DIAGNOSIS — Z3A28 28 weeks gestation of pregnancy: Secondary | ICD-10-CM | POA: Diagnosis not present

## 2016-04-20 DIAGNOSIS — A599 Trichomoniasis, unspecified: Secondary | ICD-10-CM

## 2016-04-20 DIAGNOSIS — Z3402 Encounter for supervision of normal first pregnancy, second trimester: Secondary | ICD-10-CM

## 2016-04-20 DIAGNOSIS — Z3482 Encounter for supervision of other normal pregnancy, second trimester: Secondary | ICD-10-CM

## 2016-04-20 DIAGNOSIS — O09522 Supervision of elderly multigravida, second trimester: Secondary | ICD-10-CM

## 2016-04-20 DIAGNOSIS — Z331 Pregnant state, incidental: Secondary | ICD-10-CM

## 2016-04-20 DIAGNOSIS — Z3009 Encounter for other general counseling and advice on contraception: Secondary | ICD-10-CM | POA: Insufficient documentation

## 2016-04-20 DIAGNOSIS — D251 Intramural leiomyoma of uterus: Secondary | ICD-10-CM

## 2016-04-20 LAB — POCT URINALYSIS DIPSTICK
Blood, UA: NEGATIVE
Glucose, UA: NEGATIVE
KETONES UA: NEGATIVE
Leukocytes, UA: NEGATIVE
Nitrite, UA: NEGATIVE
PROTEIN UA: NEGATIVE

## 2016-04-20 NOTE — Progress Notes (Signed)
Patient ID: Joyce [name], female   DOB: 1975/07/30, 40 y.o.   MRN: [id number]  [id number]  Estimated Date of Delivery: 07/13/16 LROB [redacted]w[redacted]d  Blood pressure (!) 142/82, pulse 76, weight 222 lb 12.8 oz (101.1 kg), last menstrual period 11/02/2015, not currently breastfeeding.    Urine results: notable for none  Chief Complaint  Patient presents with  . Routine Prenatal Visit    PN2    Patient complaints: none. Pt states she is interested in permanent sterilization after delivery.   Patient reports good fetal movement. She denies any bleeding, rupture of membranes, or regular contractions.   Refer to the ob flow sheet for FH and FHR.    Physical Examination: General appearance - alert, well appearing, and in no distress                                      Abdomen - FH 30 cm,                                                         -FHR 146 bpm                                                         soft, nontender, nondistended, no masses or organomegaly  Discussion: 1.Discussed with pt risks and benefits of permanent sterilization. Advised pt of the 3 time frames in which she could have the procedure: in the hospital after delivery, 4 wks post partum prior to becoming sexually active and 6 months post partum after risk of crib death has passed.   2. Discussed risks vs benefits of using clips only vs bilateral salpingectomy. Advised pt that bilateral salpingectomy is a permanent procedure, but reduces the risk of cancer by 2/3. Pt informed that bilateral salpingectomy would be an option at 4 weeks and 6 months post partum, but not 1 day post partum.   At end of discussion, pt had opportunity to ask questions and has no further questions at this time. Pt states she is interested in delayed interval tubal.   Specific discussion of permanent sterilization as noted above. Greater than 50% was spent in counseling and coordination of care with the patient.   Total time greater than: 25  minutes.                                                Questions were answered. Assessment: LROB [id number] @ [redacted]w[redacted]d PN2 today   Tubal ligation discussed   Plan:   1. Continued routine obstetrical care 2. Delayed interval tubal  3. Continue daily 81 mg ASA, recheck BP in 4 wks   F/u in 2 weeks for routine prenatal care   By signing my name below, I, Hansel Feinstein, attest that this documentation has been prepared under the direction and in the presence of Jonnie Kind, MD. Electronically Signed: Hansel Feinstein, ED Scribe. 04/20/16. 9:35 AM.  I  personally performed the services described in this documentation, which was SCRIBED in my presence. The recorded information has been reviewed and considered accurate. It has been edited as necessary during review. Jonnie Kind, MD

## 2016-04-21 LAB — CBC
HEMOGLOBIN: 12 g/dL (ref 11.1–15.9)
Hematocrit: 36 % (ref 34.0–46.6)
MCH: 26.8 pg (ref 26.6–33.0)
MCHC: 33.3 g/dL (ref 31.5–35.7)
MCV: 80 fL (ref 79–97)
PLATELETS: 203 10*3/uL (ref 150–379)
RBC: 4.48 x10E6/uL (ref 3.77–5.28)
RDW: 13.8 % (ref 12.3–15.4)
WBC: 8.1 10*3/uL (ref 3.4–10.8)

## 2016-04-21 LAB — GLUCOSE TOLERANCE, 2 HOURS W/ 1HR
GLUCOSE, 2 HOUR: 89 mg/dL (ref 65–152)
Glucose, 1 hour: 103 mg/dL (ref 65–179)
Glucose, Fasting: 87 mg/dL (ref 65–91)

## 2016-04-21 LAB — HIV ANTIBODY (ROUTINE TESTING W REFLEX): HIV SCREEN 4TH GENERATION: NONREACTIVE

## 2016-04-21 LAB — ANTIBODY SCREEN: Antibody Screen: NEGATIVE

## 2016-04-21 LAB — RPR: RPR: NONREACTIVE

## 2016-05-18 ENCOUNTER — Ambulatory Visit (INDEPENDENT_AMBULATORY_CARE_PROVIDER_SITE_OTHER): Payer: Medicaid Other | Admitting: Obstetrics and Gynecology

## 2016-05-18 ENCOUNTER — Encounter: Payer: Self-pay | Admitting: Obstetrics and Gynecology

## 2016-05-18 VITALS — BP 120/90 | HR 80 | Wt 218.0 lb

## 2016-05-18 DIAGNOSIS — Z331 Pregnant state, incidental: Secondary | ICD-10-CM | POA: Diagnosis not present

## 2016-05-18 DIAGNOSIS — Z3403 Encounter for supervision of normal first pregnancy, third trimester: Secondary | ICD-10-CM

## 2016-05-18 DIAGNOSIS — D251 Intramural leiomyoma of uterus: Secondary | ICD-10-CM

## 2016-05-18 DIAGNOSIS — O09523 Supervision of elderly multigravida, third trimester: Secondary | ICD-10-CM | POA: Diagnosis not present

## 2016-05-18 DIAGNOSIS — Z3A32 32 weeks gestation of pregnancy: Secondary | ICD-10-CM | POA: Diagnosis not present

## 2016-05-18 DIAGNOSIS — O3412 Maternal care for benign tumor of corpus uteri, second trimester: Secondary | ICD-10-CM

## 2016-05-18 DIAGNOSIS — Z3483 Encounter for supervision of other normal pregnancy, third trimester: Secondary | ICD-10-CM

## 2016-05-18 DIAGNOSIS — Z1389 Encounter for screening for other disorder: Secondary | ICD-10-CM

## 2016-05-18 DIAGNOSIS — O0993 Supervision of high risk pregnancy, unspecified, third trimester: Secondary | ICD-10-CM | POA: Diagnosis not present

## 2016-05-18 LAB — POCT URINALYSIS DIPSTICK
GLUCOSE UA: NEGATIVE
Leukocytes, UA: NEGATIVE
NITRITE UA: NEGATIVE
RBC UA: NEGATIVE

## 2016-05-18 NOTE — Progress Notes (Signed)
[id number]  Estimated Date of Delivery: 07/13/16 LROB [redacted]w[redacted]d  Blood pressure 120/90, pulse 80, weight 218 lb (98.9 kg), last menstrual period 11/02/2015, not currently breastfeeding.   Urine results:notable for trace ketones and protein  Chief Complaint  Patient presents with  . Routine Prenatal Visit    Refill Zantac    Patient complaints: continues to have intermittent heartburn, nausea, and vomiting. She cannot remember which medication she takes her these symptoms.  Patient reports   good fetal movement,                           denies any bleeding , rupture of membranes,or regular contractions.   refer to the ob flow sheet for FH and FHR, ,                          Physical Examination: General appearance - alert, well appearing, and in no distress                                      Abdomen - FH 32,                                                         -FHR 137                                                         soft, nontender, nondistended, no masses or organomegaly                                      Pelvic - examination not indicated                                            Questions were answered. Assessment: LROB [id number] @ [redacted]w[redacted]d , Estimated Date of Delivery: 07/13/16   Plan:  Continued routine obstetrical care,   F/u in 2 weeks for LROB   By signing my name below, I, Sonum Patel, attest that this documentation has been prepared under the direction and in the presence of Mallory Shirk, MD. Electronically Signed: Sonum Patel, Education administrator. 05/18/16. 10:53 AM.  I personally performed the services described in this documentation, which was SCRIBED in my presence. The recorded information has been reviewed and considered accurate. It has been edited as necessary during review. Jonnie Kind, MD

## 2016-05-30 NOTE — L&D Delivery Note (Signed)
41 y.o. [id number] at [redacted]w[redacted]d delivered a viable female infant in cephalic, ROA position. No nuchal cord. Left anterior shoulder delivered with ease. 60 sec delayed cord clamping. Cord clamped x2 and cut. Placenta delivered spontaneously intact, with SHORT 3VC, small looking placenta. Fundus firm on exam with massage and pitocin. Good hemostasis noted.  Anesthesia: Epidural Laceration: None Suture: N/A Good hemostasis noted. EBL: 200 cc  Mom and baby recovering in LDR.    Apgars: APGAR (1 MIN): 8   APGAR (5 MINS): 9   Weight: Pending skin to skin; Baby appeared SGA  Placenta sent to pathology due to SGA appearing baby, short cord, and small appearing placenta  Joyce [name], [place] for [employer], [place] 06/28/2016, 3:58 PM

## 2016-06-01 ENCOUNTER — Encounter: Payer: Self-pay | Admitting: Advanced Practice Midwife

## 2016-06-01 ENCOUNTER — Ambulatory Visit (INDEPENDENT_AMBULATORY_CARE_PROVIDER_SITE_OTHER): Payer: Medicaid Other | Admitting: Advanced Practice Midwife

## 2016-06-01 VITALS — BP 117/76 | HR 83 | Wt 217.0 lb

## 2016-06-01 DIAGNOSIS — O99213 Obesity complicating pregnancy, third trimester: Secondary | ICD-10-CM | POA: Diagnosis not present

## 2016-06-01 DIAGNOSIS — O09523 Supervision of elderly multigravida, third trimester: Secondary | ICD-10-CM

## 2016-06-01 DIAGNOSIS — Z3A34 34 weeks gestation of pregnancy: Secondary | ICD-10-CM

## 2016-06-01 DIAGNOSIS — Z3483 Encounter for supervision of other normal pregnancy, third trimester: Secondary | ICD-10-CM

## 2016-06-01 DIAGNOSIS — Z331 Pregnant state, incidental: Secondary | ICD-10-CM

## 2016-06-01 DIAGNOSIS — Z1389 Encounter for screening for other disorder: Secondary | ICD-10-CM

## 2016-06-01 LAB — POCT URINALYSIS DIPSTICK
Blood, UA: NEGATIVE
GLUCOSE UA: NEGATIVE
LEUKOCYTES UA: NEGATIVE
NITRITE UA: NEGATIVE

## 2016-06-01 NOTE — Progress Notes (Signed)
[id number] [redacted]w[redacted]d Estimated Date of Delivery: 07/13/16  Blood pressure 117/76, pulse 83, weight 217 lb (98.4 kg), last menstrual period 11/02/2015, not currently breastfeeding.   BP weight and urine results all reviewed and noted.  Please refer to the obstetrical flow sheet for the fundal height and fetal heart rate documentation:  Patient reports good fetal movement, denies any bleeding and no rupture of membranes symptoms or regular contractions. Patient is without complaints. All questions were answered.  Orders Placed This Encounter  Procedures  . POCT urinalysis dipstick    Plan:  Continued routine obstetrical care,   No Follow-up on file.

## 2016-06-06 ENCOUNTER — Telehealth: Payer: Self-pay | Admitting: *Deleted

## 2016-06-06 NOTE — Telephone Encounter (Signed)
Pt states she is having a cough and sore throat.  Advised pt to push fluids, use Tylenol, Mucinex, Robitussin or Sudafed, can use throat lozenges and sleep with humidifier.  Pt reports good FM, no cramping or bleeding and no loss of fluids.  Pt verbalized understanding and is call back if symptoms get worse or starts running a fever.

## 2016-06-14 ENCOUNTER — Ambulatory Visit (INDEPENDENT_AMBULATORY_CARE_PROVIDER_SITE_OTHER): Payer: Medicaid Other | Admitting: Advanced Practice Midwife

## 2016-06-14 ENCOUNTER — Encounter (HOSPITAL_COMMUNITY): Payer: Self-pay | Admitting: Emergency Medicine

## 2016-06-14 ENCOUNTER — Emergency Department (HOSPITAL_COMMUNITY)
Admission: EM | Admit: 2016-06-14 | Discharge: 2016-06-14 | Disposition: A | Discharge status: Home / Self Care | Payer: Medicaid Other | Attending: Emergency Medicine | Admitting: Emergency Medicine

## 2016-06-14 ENCOUNTER — Encounter: Payer: Self-pay | Admitting: Advanced Practice Midwife

## 2016-06-14 VITALS — BP 133/66 | HR 73 | Wt 218.0 lb

## 2016-06-14 DIAGNOSIS — Z3A36 36 weeks gestation of pregnancy: Secondary | ICD-10-CM

## 2016-06-14 DIAGNOSIS — O09523 Supervision of elderly multigravida, third trimester: Secondary | ICD-10-CM | POA: Diagnosis not present

## 2016-06-14 DIAGNOSIS — Z3A35 35 weeks gestation of pregnancy: Secondary | ICD-10-CM | POA: Diagnosis not present

## 2016-06-14 DIAGNOSIS — R05 Cough: Secondary | ICD-10-CM | POA: Insufficient documentation

## 2016-06-14 DIAGNOSIS — Z79899 Other long term (current) drug therapy: Secondary | ICD-10-CM | POA: Diagnosis not present

## 2016-06-14 DIAGNOSIS — Z1389 Encounter for screening for other disorder: Secondary | ICD-10-CM | POA: Diagnosis not present

## 2016-06-14 DIAGNOSIS — O99713 Diseases of the skin and subcutaneous tissue complicating pregnancy, third trimester: Secondary | ICD-10-CM | POA: Diagnosis present

## 2016-06-14 DIAGNOSIS — L729 Follicular cyst of the skin and subcutaneous tissue, unspecified: Secondary | ICD-10-CM

## 2016-06-14 DIAGNOSIS — O99213 Obesity complicating pregnancy, third trimester: Secondary | ICD-10-CM

## 2016-06-14 DIAGNOSIS — Z3483 Encounter for supervision of other normal pregnancy, third trimester: Secondary | ICD-10-CM

## 2016-06-14 DIAGNOSIS — Z331 Pregnant state, incidental: Secondary | ICD-10-CM

## 2016-06-14 DIAGNOSIS — M549 Dorsalgia, unspecified: Secondary | ICD-10-CM | POA: Diagnosis not present

## 2016-06-14 DIAGNOSIS — L089 Local infection of the skin and subcutaneous tissue, unspecified: Secondary | ICD-10-CM | POA: Diagnosis not present

## 2016-06-14 DIAGNOSIS — M7989 Other specified soft tissue disorders: Secondary | ICD-10-CM | POA: Diagnosis not present

## 2016-06-14 DIAGNOSIS — Z7982 Long term (current) use of aspirin: Secondary | ICD-10-CM | POA: Insufficient documentation

## 2016-06-14 LAB — POCT URINALYSIS DIPSTICK
GLUCOSE UA: NEGATIVE
KETONES UA: NEGATIVE
NITRITE UA: NEGATIVE
Protein, UA: NEGATIVE
RBC UA: NEGATIVE

## 2016-06-14 MED ORDER — LIDOCAINE VISCOUS 2 % MT SOLN: 5.0000 mL | OROMUCOSAL | 0 refills | Status: DC | PRN

## 2016-06-14 MED ORDER — AMOXICILLIN 500 MG PO CAPS: 500.0000 mg | ORAL_CAPSULE | Freq: Three times a day (TID) | ORAL | 0 refills | Status: DC

## 2016-06-14 NOTE — ED Triage Notes (Addendum)
Pt is [redacted] weeks pregnantreports abscess to right posterior neck x1 week, no drainage.

## 2016-06-14 NOTE — Patient Instructions (Signed)

## 2016-06-14 NOTE — Progress Notes (Signed)
[id number] [redacted]w[redacted]d Estimated Date of Delivery: 07/13/16  Blood pressure 133/66, pulse 73, weight 218 lb (98.9 kg), last menstrual period 11/02/2015, not currently breastfeeding.   BP weight and urine results all reviewed and noted.  Please refer to the obstetrical flow sheet for the fundal height and fetal heart rate documentation:  Patient reports good fetal movement, denies any bleeding and no rupture of membranes symptoms or regular contractions. Patient has had a sore throat/cough for > 1 week. Some sinus stuff, no fever.  Throat red, no exudate.   All questions were answered.  Orders Placed This Encounter  Procedures  . POCT urinalysis dipstick    Plan:  Continued routine obstetrical care,  Meds ordered this encounter  Medications  . DISCONTD: amoxicillin (AMOXIL) 500 MG capsule    Sig: Take 1 capsule (500 mg total) by mouth 3 (three) times daily.    Dispense:  21 capsule    Refill:  0    Order Specific Question:   Supervising Provider    Answer:   Elonda Husky, LUTHER H [[id number]]  . DISCONTD: lidocaine (XYLOCAINE) 2 % solution    Sig: Use as directed 5 mLs in the mouth or throat as needed for mouth pain.    Dispense:  100 mL    Refill:  0    Order Specific Question:   Supervising Provider    Answer:   EURE, LUTHER H [[id number]]  . amoxicillin (AMOXIL) 500 MG capsule    Sig: Take 1 capsule (500 mg total) by mouth 3 (three) times daily.    Dispense:  21 capsule    Refill:  0    Order Specific Question:   Supervising Provider    Answer:   Elonda Husky, LUTHER H [[id number]]  . lidocaine (XYLOCAINE) 2 % solution    Sig: Use as directed 5 mLs in the mouth or throat as needed for mouth pain.    Dispense:  100 mL    Refill:  0    Order Specific Question:   Supervising Provider    Answer:   Florian Buff [[id number]]     Return in about 1 week (around 06/21/2016) for [place]. OFFER FLU SHOT

## 2016-06-14 NOTE — Discharge Instructions (Signed)
Follow-up with OB/GYN as scheduled. Continue warm compresses to the area of the skin cyst. Nothing to open and drain today. Continue Tylenol. Return for any new or worse symptoms. Continue antibiotic amoxicillin.

## 2016-06-14 NOTE — ED Provider Notes (Signed)
[facility] DEPT Provider Note   CSN: [id number] Arrival date & time: 06/14/16  [id number]  By signing my name below, I, Collene Leyden, attest that this documentation has been prepared under the direction and in the presence of Fredia Sorrow, MD. Electronically Signed: Collene Leyden, Scribe. 06/14/16. 10:04 AM.   History   Chief Complaint Chief Complaint  Patient presents with  . Recurrent Skin Infections    HPI Comments: Joyce [name] is a 41 y.o. female who presents to the Emergency Department complaining of a gradual onset, skin infection at the hairline that began 6 days ago. This is a recurrent problem. Patient reports associated dizziness, cough, and ankle swelling. Patient is [redacted] weeks pregnant in which her due date is february 14th. Patient report using warm compresses with no improvement. She denies any fever, vomiting, nausea, vomiting, or chills.    The history is provided by the patient. No language interpreter was used.    Past Medical History:  Diagnosis Date  . AMA (advanced maternal age) multigravida 35+ 11/12/2014  . Migraine   . Pregnant 11/12/2014  . Round ligament pain 11/12/2014  . Short of breath on exertion 11/12/2014  . Trichomonas infection     Patient Active Problem List   Diagnosis Date Noted  . General counseling and advice on female contraception postpartum salpingectomy at 4 wk 04/20/2016  . Supervision of normal pregnancy 01/27/2016  . Uterine fibroid 01/27/2016  . Rubella non-immune status, antepartum 12/08/2014  . 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 [place] multiparity 11/26/2014  . AMA (advanced maternal age) multigravida 35+ 11/12/2014    Past Surgical History:  Procedure Laterality Date  . CHOLECYSTECTOMY      OB History    Gravida Para Term Preterm AB Living   11 7 7  0 3 7   SAB TAB Ectopic Multiple Live Births   1 2   0 7       Home Medications    Prior to Admission medications     Medication Sig Start Date End Date Taking? Authorizing Provider  aspirin EC 81 MG tablet Take 81 mg by mouth daily.   Yes Historical Provider, MD  hydrOXYzine (VISTARIL) 25 MG capsule Take 1 capsule (25 mg total) by mouth every 6 (six) hours as needed for itching. 01/11/16  Yes Lily Kocher, PA-C  meloxicam (MOBIC) 15 MG tablet Take 1 tablet (15 mg total) by mouth daily. 01/11/16  Yes Lily Kocher, PA-C  omeprazole (PRILOSEC) 20 MG capsule Take 1 capsule (20 mg total) by mouth daily. 02/24/16 02/23/17 Yes Frances Cresenzo-Dishmon, CNM  ondansetron (ZOFRAN) 4 MG tablet Take 1 tablet (4 mg total) by mouth every 6 (six) hours as needed for nausea or vomiting. 01/01/16  Yes Orpah Greek, MD  Prenatal Vit-Fe Fumarate-FA (PRENATAL VITAMIN PLUS LOW IRON) 27-1 MG TABS TAKE ONE TABLET BY MOUTH DAILY AT 12 NOON 12/24/15  Yes Estill Dooms, NP  ranitidine (ZANTAC) 150 MG tablet Take 1 tablet (150 mg total) by mouth 2 (two) times daily. 01/01/16  Yes Orpah Greek, MD  amoxicillin (AMOXIL) 500 MG capsule Take 1 capsule (500 mg total) by mouth 3 (three) times daily. 06/14/16   Christin Fudge, CNM  lidocaine (XYLOCAINE) 2 % solution Use as directed 5 mLs in the mouth or throat as needed for mouth pain. 06/14/16   Christin Fudge, CNM    Family History Family History  Problem Relation Age of Onset  . Diabetes Mother   .  Hypertension Mother   . Kidney disease Mother   . Alzheimer's disease Father   . Kidney disease Sister   . Asthma Son   . Diabetes Sister   . Asthma Son   . Heart murmur Son   . Asthma Son     Social History Social History  Substance Use Topics  . Smoking status: Never Smoker  . Smokeless tobacco: Never Used  . Alcohol use No     Allergies   Patient has no known allergies.   Review of Systems Review of Systems  Constitutional: Negative for chills and fever.  HENT: Negative for rhinorrhea and sore throat.   Eyes: Negative for visual  disturbance.  Respiratory: Positive for cough. Negative for shortness of breath.   Cardiovascular: Positive for leg swelling. Negative for chest pain.  Gastrointestinal: Negative for abdominal pain, diarrhea, nausea and vomiting.  Genitourinary: Negative for dysuria and vaginal bleeding.  Musculoskeletal: Positive for back pain.  Skin: Negative for rash.  Neurological: Negative for headaches.  Psychiatric/Behavioral: Negative for confusion.     Physical Exam Updated Vital Signs BP 128/85 (BP Location: Left Arm)   Pulse 78   Temp 97.5 F (36.4 C) (Oral)   Resp 16   Ht 5\' 5"  (1.651 m)   Wt 98.9 kg   LMP 11/02/2015   SpO2 99%   BMI 36.28 kg/m   Physical Exam  Constitutional: She is oriented to person, place, and time. She appears well-developed.  HENT:  Head: Normocephalic and atraumatic.  Mouth/Throat: Oropharynx is clear and moist.  Eyes track.   Eyes: Conjunctivae are normal. Pupils are equal, round, and reactive to light.  Neck: Normal range of motion. Neck supple.  Cardiovascular: Normal rate.   Pulmonary/Chest: Effort normal.  Abdominal: Soft. Bowel sounds are normal.  Genitourinary:  Genitourinary Comments: Uterus high in the abdomen.   Musculoskeletal: Normal range of motion.  Lymphadenopathy:    She has no cervical adenopathy.  Neurological: She is alert and oriented to person, place, and time.  Skin: Skin is warm and dry.  There is an area that is pointing with infected follicle 5 mm and an area of induration measuring at 2 cm. No lymph adenopathy. No erythema.      ED Treatments / Results  DIAGNOSTIC STUDIES: Oxygen Saturation is 99% on RA, normal by my interpretation.    COORDINATION OF CARE: 10:10 AM Discussed treatment plan with pt at bedside and pt agreed to plan.  Labs (all labs ordered are listed, but only abnormal results are displayed) Labs Reviewed - No data to display  EKG  EKG Interpretation None       Radiology No results  found.  Procedures Procedures (including critical care time)  Medications Ordered in ED Medications - No data to display   Initial Impression / Assessment and Plan / ED Course  I have reviewed the triage vital signs and the nursing notes.  Pertinent labs & imaging results that were available during my care of the patient were reviewed by me and considered in my medical decision making (see chart for details).  Clinical Course     Patient with a skin cyst at the base of the hairline on the right side. Nothing to incise and drain at this time. Patient had amoxicillin phoned in by her OB/GYN doctor to start taking today. Recommend that she take that. Continue warm compresses. Follow-up with OB/GYN. Return for any new or worse symptoms.  Final Clinical Impressions(s) / ED Diagnoses   Final  diagnoses:  Infected cyst of skin    New Prescriptions New Prescriptions   No medications on file   I personally performed the services described in this documentation, which was scribed in my presence. The recorded information has been reviewed and is accurate.       Fredia Sorrow, MD 06/14/16 1048

## 2016-06-21 ENCOUNTER — Encounter: Payer: Self-pay | Admitting: Women's Health

## 2016-06-21 ENCOUNTER — Ambulatory Visit (INDEPENDENT_AMBULATORY_CARE_PROVIDER_SITE_OTHER): Payer: Medicaid Other | Admitting: Women's Health

## 2016-06-21 VITALS — BP 138/66 | HR 69 | Wt 215.0 lb

## 2016-06-21 DIAGNOSIS — Z23 Encounter for immunization: Secondary | ICD-10-CM | POA: Diagnosis not present

## 2016-06-21 DIAGNOSIS — O99213 Obesity complicating pregnancy, third trimester: Secondary | ICD-10-CM | POA: Diagnosis not present

## 2016-06-21 DIAGNOSIS — Z3483 Encounter for supervision of other normal pregnancy, third trimester: Secondary | ICD-10-CM | POA: Diagnosis not present

## 2016-06-21 DIAGNOSIS — Z3A37 37 weeks gestation of pregnancy: Secondary | ICD-10-CM | POA: Diagnosis not present

## 2016-06-21 DIAGNOSIS — Z1389 Encounter for screening for other disorder: Secondary | ICD-10-CM

## 2016-06-21 DIAGNOSIS — Z331 Pregnant state, incidental: Secondary | ICD-10-CM | POA: Diagnosis not present

## 2016-06-21 LAB — POCT URINALYSIS DIPSTICK
Glucose, UA: NEGATIVE
Ketones, UA: NEGATIVE
Nitrite, UA: NEGATIVE
RBC UA: NEGATIVE

## 2016-06-21 LAB — OB RESULTS CONSOLE GBS: GBS: NEGATIVE

## 2016-06-21 NOTE — Progress Notes (Signed)
Low-risk OB appointment [id number] [redacted]w[redacted]d Estimated Date of Delivery: 07/13/16 BP 138/66   Pulse 69   Wt 215 lb (97.5 kg)   LMP 11/02/2015   BMI 35.78 kg/m   BP, weight, and urine reviewed.  Refer to obstetrical flow sheet for FH & FHR.  Reports good fm.  Denies regular uc's, lof, vb, or uti s/s. No complaints. Wants interval salpingectomy, reviewed risks/benefits, consent signed today Gbs, gc/ct collected SVE per request: tight 3/th/-3, vtx Reviewed labor s/s, fkc. Plan:  Continue routine obstetrical care  F/U in 1wk for OB appointment  Flu shot today

## 2016-06-21 NOTE — Patient Instructions (Signed)
Call the office ([phone]) or go to [facility] if:  You begin to have strong, frequent contractions  Your water breaks.  Sometimes it is a big gush of fluid, sometimes it is just a trickle that keeps getting your panties wet or running down your legs  You have vaginal bleeding.  It is normal to have a small amount of spotting if your cervix was checked.   You don't feel your baby moving like normal.  If you don't, get you something to eat and drink and lay down and focus on feeling your baby move.  You should feel at least 10 movements in 2 hours.  If you don't, you should call the office or go to [facility].     Braxton Hicks Contractions Contractions of the uterus can occur throughout pregnancy. Contractions are not always a sign that you are in labor.  WHAT ARE BRAXTON HICKS CONTRACTIONS?  Contractions that occur before labor are called Braxton Hicks contractions, or false labor. Toward the end of pregnancy (32-34 weeks), these contractions can develop more often and may become more forceful. This is not true labor because these contractions do not result in opening (dilatation) and thinning of the cervix. They are sometimes difficult to tell apart from true labor because these contractions can be forceful and people have different pain tolerances. You should not feel embarrassed if you go to the hospital with false labor. Sometimes, the only way to tell if you are in true labor is for your health care provider to look for changes in the cervix. If there are no prenatal problems or other health problems associated with the pregnancy, it is completely safe to be sent home with false labor and await the onset of true labor. HOW CAN YOU TELL THE DIFFERENCE BETWEEN TRUE AND FALSE LABOR? False Labor   The contractions of false labor are usually shorter and not as hard as those of true labor.   The contractions are usually irregular.   The contractions are often felt in the front of  the lower abdomen and in the groin.   The contractions may go away when you walk around or change positions while lying down.   The contractions get weaker and are shorter lasting as time goes on.   The contractions do not usually become progressively stronger, regular, and closer together as with true labor.  True Labor   Contractions in true labor last 30-70 seconds, become very regular, usually become more intense, and increase in frequency.   The contractions do not go away with walking.   The discomfort is usually felt in the top of the uterus and spreads to the lower abdomen and low back.   True labor can be determined by your health care provider with an exam. This will show that the cervix is dilating and getting thinner.  WHAT TO REMEMBER  Keep up with your usual exercises and follow other instructions given by your health care provider.   Take medicines as directed by your health care provider.   Keep your regular prenatal appointments.   Eat and drink lightly if you think you are going into labor.   If Braxton Hicks contractions are making you uncomfortable:   Change your position from lying down or resting to walking, or from walking to resting.   Sit and rest in a tub of warm water.   Drink 2-3 glasses of water. Dehydration may cause these contractions.   Do slow and deep breathing several   times an hour.  WHEN SHOULD I SEEK IMMEDIATE MEDICAL CARE? Seek immediate medical care if:  Your contractions become stronger, more regular, and closer together.   You have fluid leaking or gushing from your vagina.   You have a fever.   You pass blood-tinged mucus.   You have vaginal bleeding.   You have continuous abdominal pain.   You have low back pain that you never had before.   You feel your baby's head pushing down and causing pelvic pressure.   Your baby is not moving as much as it used to.  This information is not intended to  replace advice given to you by your health care provider. Make sure you discuss any questions you have with your health care provider. Document Released: 05/16/2005 Document Revised: 09/07/2015 Document Reviewed: 02/25/2013 Elsevier Interactive Patient Education  2017 [employer]  

## 2016-06-23 LAB — GC/CHLAMYDIA PROBE AMP
Chlamydia trachomatis, NAA: NEGATIVE
Neisseria gonorrhoeae by PCR: NEGATIVE

## 2016-06-23 LAB — STREP GP B NAA: Strep Gp B NAA: NEGATIVE

## 2016-06-27 ENCOUNTER — Encounter: Payer: Self-pay | Admitting: Obstetrics and Gynecology

## 2016-06-27 ENCOUNTER — Ambulatory Visit (INDEPENDENT_AMBULATORY_CARE_PROVIDER_SITE_OTHER): Payer: Medicaid Other | Admitting: Obstetrics and Gynecology

## 2016-06-27 VITALS — BP 130/78 | HR 76 | Wt 216.0 lb

## 2016-06-27 DIAGNOSIS — Z3A38 38 weeks gestation of pregnancy: Secondary | ICD-10-CM | POA: Diagnosis not present

## 2016-06-27 DIAGNOSIS — Z1389 Encounter for screening for other disorder: Secondary | ICD-10-CM

## 2016-06-27 DIAGNOSIS — O2693 Pregnancy related conditions, unspecified, third trimester: Secondary | ICD-10-CM | POA: Diagnosis not present

## 2016-06-27 DIAGNOSIS — Z118 Encounter for screening for other infectious and parasitic diseases: Secondary | ICD-10-CM

## 2016-06-27 DIAGNOSIS — Z331 Pregnant state, incidental: Secondary | ICD-10-CM

## 2016-06-27 DIAGNOSIS — Z3483 Encounter for supervision of other normal pregnancy, third trimester: Secondary | ICD-10-CM

## 2016-06-27 DIAGNOSIS — O4693 Antepartum hemorrhage, unspecified, third trimester: Secondary | ICD-10-CM | POA: Diagnosis not present

## 2016-06-27 DIAGNOSIS — Z1159 Encounter for screening for other viral diseases: Secondary | ICD-10-CM

## 2016-06-27 LAB — POCT URINALYSIS DIPSTICK
Glucose, UA: NEGATIVE
KETONES UA: NEGATIVE
Nitrite, UA: NEGATIVE
Protein, UA: NEGATIVE

## 2016-06-27 NOTE — Progress Notes (Signed)
Dictation #1 [id number]  [id number]  Estimated Date of Delivery: 07/13/16 LROB [redacted]w[redacted]d  Chief Complaint  Patient presents with  . w/i    dark vaginal bleeding, cramping  ____  Patient complaints: vaginal bleeding for 2 days. She states she has been saturating several pads with the bleeding.  Patient reports   good fetal movement,                           denies rupture of membranes or regular contractions.  Blood pressure 130/78, pulse 76, weight 216 lb (98 kg), last menstrual period 11/02/2015, not currently breastfeeding.   Urine results:notable for blood  refer to the ob flow sheet for FH and FHR, ,                          Physical Examination: General appearance - alert, well appearing, and in no distress                                      Abdomen - FH 38 ,                                                         -FHR 120                                                         soft, nontender, nondistended, no masses or organomegaly                                      Pelvic - dark mucoid discharge with blood  Patient was put on the monitor and was noted to have no uterine activity.                                             Questions were answered. Assessment: LROB [id number] @ [redacted]w[redacted]d, EDD: 07/13/16   Plan:  Continued routine obstetrical care,   F/u in 1 week for LROB  By signing my name below, I, Sonum Patel, attest that this documentation has been prepared under the direction and in the presence of Jonnie Kind, MD. Electronically Signed: Sonum Patel, Education administrator. 06/27/16. 11:55 AM.  I personally performed the services described in this documentation, which was SCRIBED in my presence. The recorded information has been reviewed and considered accurate. It has been edited as necessary during review. Jonnie Kind, MD

## 2016-06-28 ENCOUNTER — Inpatient Hospital Stay (HOSPITAL_COMMUNITY): Payer: Medicaid Other | Admitting: Anesthesiology

## 2016-06-28 ENCOUNTER — Encounter: Payer: Medicaid Other | Admitting: Advanced Practice Midwife

## 2016-06-28 ENCOUNTER — Encounter (HOSPITAL_COMMUNITY): Payer: Self-pay

## 2016-06-28 ENCOUNTER — Inpatient Hospital Stay (HOSPITAL_COMMUNITY)
Admission: AD | Admit: 2016-06-28 | Discharge: 2016-06-30 | DRG: 774 | Disposition: A | Discharge status: Home / Self Care | Payer: Medicaid Other | Source: Ambulatory Visit | Attending: Obstetrics and Gynecology | Admitting: Obstetrics and Gynecology

## 2016-06-28 DIAGNOSIS — E669 Obesity, unspecified: Secondary | ICD-10-CM | POA: Diagnosis present

## 2016-06-28 DIAGNOSIS — Z3A37 37 weeks gestation of pregnancy: Secondary | ICD-10-CM

## 2016-06-28 DIAGNOSIS — Z8249 Family history of ischemic heart disease and other diseases of the circulatory system: Secondary | ICD-10-CM

## 2016-06-28 DIAGNOSIS — O99214 Obesity complicating childbirth: Secondary | ICD-10-CM | POA: Diagnosis present

## 2016-06-28 DIAGNOSIS — Z6835 Body mass index (BMI) 35.0-35.9, adult: Secondary | ICD-10-CM

## 2016-06-28 DIAGNOSIS — Z833 Family history of diabetes mellitus: Secondary | ICD-10-CM | POA: Diagnosis not present

## 2016-06-28 DIAGNOSIS — O1002 Pre-existing essential hypertension complicating childbirth: Secondary | ICD-10-CM | POA: Diagnosis present

## 2016-06-28 DIAGNOSIS — Z3493 Encounter for supervision of normal pregnancy, unspecified, third trimester: Secondary | ICD-10-CM | POA: Diagnosis present

## 2016-06-28 DIAGNOSIS — O9962 Diseases of the digestive system complicating childbirth: Secondary | ICD-10-CM | POA: Diagnosis present

## 2016-06-28 DIAGNOSIS — K219 Gastro-esophageal reflux disease without esophagitis: Secondary | ICD-10-CM | POA: Diagnosis present

## 2016-06-28 DIAGNOSIS — Z3483 Encounter for supervision of other normal pregnancy, third trimester: Secondary | ICD-10-CM

## 2016-06-28 LAB — COMPREHENSIVE METABOLIC PANEL
ALBUMIN: 2.7 g/dL — AB (ref 3.5–5.0)
ALK PHOS: 88 U/L (ref 38–126)
ALT: 13 U/L — ABNORMAL LOW (ref 14–54)
ANION GAP: 6 (ref 5–15)
AST: 17 U/L (ref 15–41)
BILIRUBIN TOTAL: 0.8 mg/dL (ref 0.3–1.2)
BUN: 7 mg/dL (ref 6–20)
CALCIUM: 8.7 mg/dL — AB (ref 8.9–10.3)
CO2: 23 mmol/L (ref 22–32)
CREATININE: 0.53 mg/dL (ref 0.44–1.00)
Chloride: 106 mmol/L (ref 101–111)
GFR calc Af Amer: >60 mL/min (ref >60)
GFR calc non Af Amer: >60 mL/min (ref >60)
Glucose, Bld: 83 mg/dL (ref 65–99)
Potassium: 4.1 mmol/L (ref 3.5–5.1)
Sodium: 135 mmol/L (ref 135–145)
TOTAL PROTEIN: 6.6 g/dL (ref 6.5–8.1)

## 2016-06-28 LAB — CBC
HCT: 37.6 % (ref 36.0–46.0)
Hemoglobin: 12.7 g/dL (ref 12.0–15.0)
MCH: 26.6 pg (ref 26.0–34.0)
MCHC: 33.8 g/dL (ref 30.0–36.0)
MCV: 78.8 fL (ref 78.0–100.0)
PLATELETS: 173 10*3/uL (ref 150–400)
RBC: 4.77 MIL/uL (ref 3.87–5.11)
RDW: 14.5 % (ref 11.5–15.5)
WBC: 10.8 10*3/uL — ABNORMAL HIGH (ref 4.0–10.5)

## 2016-06-28 LAB — PROTEIN / CREATININE RATIO, URINE
Creatinine, Urine: 278 mg/dL
PROTEIN CREATININE RATIO: 0.1 mg/mg{creat} (ref 0.00–0.15)
Total Protein, Urine: 29 mg/dL

## 2016-06-28 LAB — TYPE AND SCREEN
ABO/RH(D): A POS
Antibody Screen: NEGATIVE

## 2016-06-28 MED ORDER — SODIUM CHLORIDE 0.9% FLUSH: 3.0000 mL | INTRAVENOUS | Status: DC | PRN

## 2016-06-28 MED ORDER — LACTATED RINGERS IV SOLN: 500.0000 mL | Freq: Once | INTRAVENOUS | Status: DC

## 2016-06-28 MED ORDER — COCONUT OIL OIL
1.0000 "application " | TOPICAL_OIL | Status: DC | PRN
  Filled 2016-06-28: qty 120

## 2016-06-28 MED ORDER — OXYCODONE-ACETAMINOPHEN 5-325 MG PO TABS: 1.0000 | ORAL_TABLET | ORAL | Status: DC | PRN

## 2016-06-28 MED ORDER — OXYTOCIN 40 UNITS IN LACTATED RINGERS INFUSION - SIMPLE MED
2.5000 [IU]/h | INTRAVENOUS | Status: DC
  Filled 2016-06-28: qty 1000

## 2016-06-28 MED ORDER — MEASLES, MUMPS & RUBELLA VAC ~~LOC~~ INJ: 0.5000 mL | INJECTION | Freq: Once | SUBCUTANEOUS | Status: DC

## 2016-06-28 MED ORDER — TERBUTALINE SULFATE 1 MG/ML IJ SOLN: 0.2500 mg | Freq: Once | INTRAMUSCULAR | Status: DC | PRN

## 2016-06-28 MED ORDER — ONDANSETRON HCL 4 MG/2ML IJ SOLN
4.0000 mg | Freq: Four times a day (QID) | INTRAMUSCULAR | Status: DC | PRN
  Administered 2016-06-28: 4 mg via INTRAVENOUS
  Filled 2016-06-28: qty 2

## 2016-06-28 MED ORDER — SODIUM CHLORIDE 0.9 % IV SOLN: 250.0000 mL | INTRAVENOUS | Status: DC | PRN

## 2016-06-28 MED ORDER — DIBUCAINE 1 % RE OINT: 1.0000 "application " | TOPICAL_OINTMENT | RECTAL | Status: DC | PRN

## 2016-06-28 MED ORDER — OXYTOCIN BOLUS FROM INFUSION
500.0000 mL | Freq: Once | INTRAVENOUS | Status: AC
  Administered 2016-06-28: 500 mL via INTRAVENOUS

## 2016-06-28 MED ORDER — OXYTOCIN 40 UNITS IN LACTATED RINGERS INFUSION - SIMPLE MED
1.0000 m[IU]/min | INTRAVENOUS | Status: DC
  Administered 2016-06-28: 2 m[IU]/min via INTRAVENOUS

## 2016-06-28 MED ORDER — OXYCODONE-ACETAMINOPHEN 5-325 MG PO TABS: 2.0000 | ORAL_TABLET | ORAL | Status: DC | PRN

## 2016-06-28 MED ORDER — LIDOCAINE HCL (PF) 1 % IJ SOLN
30.0000 mL | INTRAMUSCULAR | Status: DC | PRN
  Filled 2016-06-28: qty 30

## 2016-06-28 MED ORDER — EPHEDRINE 5 MG/ML INJ
10.0000 mg | INTRAVENOUS | Status: DC | PRN
  Filled 2016-06-28: qty 2

## 2016-06-28 MED ORDER — PRENATAL MULTIVITAMIN CH
1.0000 | ORAL_TABLET | Freq: Every day | ORAL | Status: DC
  Administered 2016-06-29: 1 via ORAL
  Filled 2016-06-28 (×2): qty 1

## 2016-06-28 MED ORDER — LACTATED RINGERS IV SOLN
INTRAVENOUS | Status: DC
  Administered 2016-06-28 (×2): via INTRAVENOUS

## 2016-06-28 MED ORDER — BENZOCAINE-MENTHOL 20-0.5 % EX AERO: 1.0000 "application " | INHALATION_SPRAY | CUTANEOUS | Status: DC | PRN

## 2016-06-28 MED ORDER — SENNOSIDES-DOCUSATE SODIUM 8.6-50 MG PO TABS
2.0000 | ORAL_TABLET | ORAL | Status: DC
  Administered 2016-06-29 – 2016-06-30 (×2): 2 via ORAL
  Filled 2016-06-28 (×2): qty 2

## 2016-06-28 MED ORDER — LACTATED RINGERS IV SOLN
500.0000 mL | INTRAVENOUS | Status: DC | PRN
  Administered 2016-06-28: 500 mL via INTRAVENOUS

## 2016-06-28 MED ORDER — OXYTOCIN 40 UNITS IN LACTATED RINGERS INFUSION - SIMPLE MED: 2.5000 [IU]/h | INTRAVENOUS | Status: DC | PRN

## 2016-06-28 MED ORDER — SIMETHICONE 80 MG PO CHEW
80.0000 mg | CHEWABLE_TABLET | ORAL | Status: DC | PRN
Start: 2016-06-28 — End: 2016-06-30

## 2016-06-28 MED ORDER — PHENYLEPHRINE 40 MCG/ML (10ML) SYRINGE FOR IV PUSH (FOR BLOOD PRESSURE SUPPORT)
80.0000 ug | PREFILLED_SYRINGE | INTRAVENOUS | Status: DC | PRN
  Filled 2016-06-28: qty 5

## 2016-06-28 MED ORDER — FENTANYL 2.5 MCG/ML BUPIVACAINE 1/10 % EPIDURAL INFUSION (WH - ANES)
14.0000 mL/h | INTRAMUSCULAR | Status: DC | PRN
  Administered 2016-06-28: 14 mL/h via EPIDURAL
  Filled 2016-06-28: qty 100

## 2016-06-28 MED ORDER — ACETAMINOPHEN 325 MG PO TABS
650.0000 mg | ORAL_TABLET | ORAL | Status: DC | PRN
  Administered 2016-06-28: 650 mg via ORAL
  Filled 2016-06-28: qty 2

## 2016-06-28 MED ORDER — ONDANSETRON HCL 4 MG/2ML IJ SOLN: 4.0000 mg | INTRAMUSCULAR | Status: DC | PRN

## 2016-06-28 MED ORDER — SOD CITRATE-CITRIC ACID 500-334 MG/5ML PO SOLN: 30.0000 mL | ORAL | Status: DC | PRN

## 2016-06-28 MED ORDER — TETANUS-DIPHTH-ACELL PERTUSSIS 5-2.5-18.5 LF-MCG/0.5 IM SUSP: 0.5000 mL | Freq: Once | INTRAMUSCULAR | Status: DC

## 2016-06-28 MED ORDER — DIPHENHYDRAMINE HCL 50 MG/ML IJ SOLN: 12.5000 mg | INTRAMUSCULAR | Status: DC | PRN

## 2016-06-28 MED ORDER — WITCH HAZEL-GLYCERIN EX PADS: 1.0000 "application " | MEDICATED_PAD | CUTANEOUS | Status: DC | PRN

## 2016-06-28 MED ORDER — PHENYLEPHRINE 40 MCG/ML (10ML) SYRINGE FOR IV PUSH (FOR BLOOD PRESSURE SUPPORT)
80.0000 ug | PREFILLED_SYRINGE | INTRAVENOUS | Status: DC | PRN
  Filled 2016-06-28: qty 5
  Filled 2016-06-28: qty 10

## 2016-06-28 MED ORDER — SODIUM CHLORIDE 0.9% FLUSH: 3.0000 mL | Freq: Two times a day (BID) | INTRAVENOUS | Status: DC

## 2016-06-28 MED ORDER — DIPHENHYDRAMINE HCL 25 MG PO CAPS
25.0000 mg | ORAL_CAPSULE | Freq: Four times a day (QID) | ORAL | Status: DC | PRN
  Administered 2016-06-28: 25 mg via ORAL
  Filled 2016-06-28: qty 1

## 2016-06-28 MED ORDER — ACETAMINOPHEN 325 MG PO TABS: 650.0000 mg | ORAL_TABLET | ORAL | Status: DC | PRN

## 2016-06-28 MED ORDER — FLEET ENEMA 7-19 GM/118ML RE ENEM: 1.0000 | ENEMA | RECTAL | Status: DC | PRN

## 2016-06-28 MED ORDER — ZOLPIDEM TARTRATE 5 MG PO TABS: 5.0000 mg | ORAL_TABLET | Freq: Every evening | ORAL | Status: DC | PRN

## 2016-06-28 MED ORDER — LIDOCAINE HCL (PF) 1 % IJ SOLN
INTRAMUSCULAR | Status: DC | PRN
  Administered 2016-06-28 (×2): 4 mL via EPIDURAL

## 2016-06-28 MED ORDER — IBUPROFEN 600 MG PO TABS
600.0000 mg | ORAL_TABLET | Freq: Four times a day (QID) | ORAL | Status: DC
  Administered 2016-06-28 – 2016-06-30 (×7): 600 mg via ORAL
  Filled 2016-06-28 (×9): qty 1

## 2016-06-28 MED ORDER — ONDANSETRON HCL 4 MG PO TABS
4.0000 mg | ORAL_TABLET | ORAL | Status: DC | PRN
Start: 2016-06-28 — End: 2016-06-30

## 2016-06-28 NOTE — Progress Notes (Signed)
OB Note CTSP by Dr. Vanetta Shawl for concern for fetal presentation.   On her SVE, she thought she felt something presenting before the head and ?abnormal u/s.  I scanned her and she's cephalic, with nothing presenting in front of it. Doppler flow checked across the cervix/LUS and nothing seen. Subjectively normal AFI and cord seen at the fundus.   Fetus category I. Will recheck SVE in a few hours and if no change by then, d/w her re: AROM  Durene Romans MD [facility]ending [facility]) 06/28/2016 Time: [phone]

## 2016-06-28 NOTE — H&P (Signed)
LABOR AND DELIVERY ADMISSION HISTORY AND PHYSICAL NOTE  Joyce [name] is a 41 y.o. female [phone] with IUP at 69w6dby 14 wk UKoreapresenting for bleeding, found to be in active labor.   Patient states for the past week she has noticed some contractions, starting last night the contractions became stronger and closer together, today were 5 min apart, but states are not very strong. She noted yesterday some VB, went to her OB, who stated if gets worse to come in to MAU, which is why she is here.   She reports positive fetal movement. She denies leakage of fluid. She denies HAs, changes in her vision, RUQ/epigastric pain. She states she has had borderline elevated BPs in office 1 or 2 times during her prenatal care, but was never diagnosed with CHTN or GHTN or preE.   Prenatal History/Complications: A few elevated BPs during pregnancy but no diagnosis of GHTN or preE.   Past Medical History: Past Medical History:  Diagnosis Date  . AMA (advanced maternal age) multigravida 35+ 11/12/2014  . Migraine   . Pregnant 11/12/2014  . Round ligament pain 11/12/2014  . Short of breath on exertion 11/12/2014  . Trichomonas infection     Past Surgical History: Past Surgical History:  Procedure Laterality Date  . CHOLECYSTECTOMY      Obstetrical History: OB History    Gravida Para Term Preterm AB Living   _0 0 3 7   SAB TAB Ectopic Multiple Live Births   1 2   0 7      Social History: Social History   Social History  . Marital status: Single    Spouse name: N/A  . Number of children: N/A  . Years of education: N/A   Social History Main Topics  . Smoking status: Never Smoker  . Smokeless tobacco: Never Used  . Alcohol use No  . Drug use: No  . Sexual activity: Yes    Birth control/ protection: None   Other Topics Concern  . Not on file   Social History Narrative  . No narrative on file    Family History: Family History  Problem Relation Age of Onset  . Diabetes Mother    . Hypertension Mother   . Kidney disease Mother   . Alzheimer's disease Father   . Kidney disease Sister   . Asthma Son   . Diabetes Sister   . Asthma Son   . Heart murmur Son   . Asthma Son     Allergies: No Known Allergies  Prescriptions Prior to Admission  Medication Sig Dispense Refill Last Dose  . aspirin EC 81 MG tablet Take 81 mg by mouth daily.   Taking  . hydrOXYzine (VISTARIL) 25 MG capsule Take 1 capsule (25 mg total) by mouth every 6 (six) hours as needed for itching. 15 capsule 0 Taking  . lidocaine (XYLOCAINE) 2 % solution Use as directed 5 mLs in the mouth or throat as needed for mouth pain. 100 mL 0 Taking  . meloxicam (MOBIC) 15 MG tablet Take 1 tablet (15 mg total) by mouth daily. 7 tablet 0 Taking  . omeprazole (PRILOSEC) 20 MG capsule Take 1 capsule (20 mg total) by mouth daily. 30 capsule 6 Taking  . ondansetron (ZOFRAN) 4 MG tablet Take 1 tablet (4 mg total) by mouth every 6 (six) hours as needed for nausea or vomiting. 20 tablet 0 Taking  . Prenatal Vit-Fe Fumarate-FA (PRENATAL VITAMIN PLUS LOW IRON) 27-1 MG TABS  TAKE ONE TABLET BY MOUTH DAILY AT 12 NOON 267 tablet 2 Taking  . ranitidine (ZANTAC) 150 MG tablet Take 1 tablet (150 mg total) by mouth 2 (two) times daily. 60 tablet 0 Taking     Review of Systems   All systems reviewed and negative except as stated in HPI  Last menstrual period 11/02/2015, not currently breastfeeding. General appearance: alert, cooperative, appears stated age and no distress Lungs: clear to auscultation bilaterally Heart: regular rate and rhythm Abdomen: soft, non-tender; bowel sounds normal Extremities: No calf swelling or tenderness Presentation: cephalic Fetal monitoring: 130 bpm, mod var, +accels, no decels Uterine activity: Could not assess, maybe one contraction then taken off monitor to be taken to L&D.     Prenatal labs: ABO, Rh: A/Positive/-- (08/30 1625) Antibody: Negative (11/22 0932) Rubella: !Error!  non-immune RPR: Non Reactive (11/22 0932)  HBsAg: Negative (08/30 1625)  HIV: Non Reactive (11/22 0932)  GBS: Negative (01/23 1200)  2 hr Glucola: 87/103/89 - WNL Genetic screening:  AFP increased DSR, cfDNA normal Anatomy US: 2 Lt EICF  Prenatal Transfer Tool  Maternal Diabetes: No Genetic Screening: Abnormal:  Results: Elevated AFP, Other: cfDNA normal Maternal Ultrasounds/Referrals: Abnormal:  Findings:   Isolated EIF (echogenic intracardiac focus) LV EICF x2, small Fetal Ultrasounds or other Referrals:  None Maternal Substance Abuse:  No Significant Maternal Medications:  None Significant Maternal Lab Results: Lab values include: Group B Strep negative, Other:  Rubella non-immune  Results for orders placed or performed in visit on 06/27/16 (from the past 24 hour(s))  POCT urinalysis dipstick   Collection Time: 06/27/16  1:50 PM  Result Value Ref Range   Color, UA     Clarity, UA     Glucose, UA neg    Bilirubin, UA     Ketones, UA neg    Spec Grav, UA     Blood, UA large    pH, UA     Protein, UA neg    Urobilinogen, UA     Nitrite, UA neg    Leukocytes, UA moderate (2+) (A) Negative    Patient Active Problem List   Diagnosis Date Noted  . General counseling and advice on female contraception postpartum salpingectomy at 4 wk 04/20/2016  . Supervision of normal pregnancy 01/27/2016  . Uterine fibroid 01/27/2016  . Rubella non-immune status, antepartum 12/08/2014  . 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 [place] multiparity 11/26/2014  . AMA (advanced maternal age) multigravida 35+ 11/12/2014    Assessment: Joyce [name] is a 41 y.o. [id number] at 73w6dhere for active labor.   #Labor: SOL #Pain:  Epidural if possible #FWB:  Cat I #ID:   GBS Neg #MOF:  Both #MOC: Interval salpingectomy #Circ:   N/A #Elev BP: PreE labs to be performed, no signs/symptoms #Rubella non-immune: MMR postpartum #Two LV EICF   EKatherine Basset DO OB Fellow [facility]1/30/2018, 10:20 AM

## 2016-06-28 NOTE — Anesthesia Procedure Notes (Signed)
Epidural Patient location during procedure: OB Start time: 06/28/2016 11:31 AM  Staffing Anesthesiologist: Josephine Igo Performed: anesthesiologist   Preanesthetic Checklist Completed: patient identified, site marked, surgical consent, pre-op evaluation, timeout performed, IV checked, risks and benefits discussed and monitors and equipment checked  Epidural Patient position: sitting Prep: site prepped and draped and DuraPrep Patient monitoring: continuous pulse ox and blood pressure Approach: midline Location: L3-L4 Injection technique: LOR air  Needle:  Needle type: Tuohy  Needle gauge: 17 G Needle length: 9 cm and 9 Needle insertion depth: 7 cm Catheter type: closed end flexible Catheter size: 19 Gauge Catheter at skin depth: 12 cm Test dose: negative and Other  Assessment Events: blood not aspirated, injection not painful, no injection resistance, negative IV test and no paresthesia  Additional Notes Patient identified. Risks and benefits discussed including failed block, incomplete  Pain control, post dural puncture headache, nerve damage, paralysis, blood pressure Changes, nausea, vomiting, reactions to medications-both toxic and allergic and post Partum back pain. All questions were answered. Patient expressed understanding and wished to proceed. Sterile technique was used throughout procedure. Epidural site was Dressed with sterile barrier dressing. No paresthesias, signs of intravascular injection Or signs of intrathecal spread were encountered.  Patient was more comfortable after the epidural was dosed. Please see RN's note for documentation of vital signs and FHR which are stable.

## 2016-06-28 NOTE — MAU Note (Signed)
Pt arrived by EMS.  States bleeding started on Sat, was brownish.  Became red today. Denies hx of previa or low lying placenta, no prior bleeding this preg. Was seen in office yesterday.  Started having irreg ctx's last night, are now every 6 min.

## 2016-06-28 NOTE — Lactation Note (Addendum)
This note was copied from a baby's chart. Lactation Consultation Note  Patient Name: Joyce [name] [id number] Date: 06/28/2016 Reason for consult: Initial assessment Baby at 3 hr of life. Upon entry baby was sleeping sts on mom. Mom desires to offer breast and formula bottles. Reviewed the risks of formula and artificial nipples, she stated understanding. She plans to "mostly formula feed". She reports bf her older childern "some".  She declined latch help at this visit because "she just ate". She stated she can manually express and has spoon in room. Given lactation and LPT infant handouts. Aware of OP services and support group. Discussed LPT infant feeding volume guidelines.     Maternal Data Formula Feeding for Exclusion: Yes Reason for exclusion: Mother's choice to formula and breast feed on admission Has patient been taught Hand Expression?: Yes Does the patient have breastfeeding experience prior to this delivery?: Yes  Feeding Feeding Type: Bottle Fed - Formula Nipple Type: Slow - flow Length of feed: 1 min  LATCH Score/Interventions Latch: Repeated attempts needed to sustain latch, nipple held in mouth throughout feeding, stimulation needed to elicit sucking reflex. Intervention(s): Adjust position;Assist with latch;Breast massage;Breast compression  Audible Swallowing: None Intervention(s): Skin to skin;Hand expression  Type of Nipple: Inverted Intervention(s): Reverse pressure  Comfort (Breast/Nipple): Soft / non-tender     Hold (Positioning): Assistance needed to correctly position infant at breast and maintain latch. Intervention(s): Breastfeeding basics reviewed;Support Pillows;Position options;Skin to skin  LATCH Score: 4  Lactation Tools Discussed/Used WIC Program: Yes   Consult Status Consult Status: Follow-up Date: 06/29/16 Follow-up type: In-patient    Joyce [name] 06/28/2016, 6:59 PM

## 2016-06-28 NOTE — Anesthesia Preprocedure Evaluation (Signed)
Anesthesia Evaluation  Patient identified by MRN, date of birth, ID band Patient awake    Reviewed: Allergy & Precautions, Patient's Chart, lab work & pertinent test results  Airway Mallampati: III       Dental no notable dental hx. (+) Teeth Intact   Pulmonary neg pulmonary ROS,    Pulmonary exam normal breath sounds clear to auscultation       Cardiovascular negative cardio ROS Normal cardiovascular exam Rhythm:Regular Rate:Normal     Neuro/Psych  Headaches, negative psych ROS   GI/Hepatic Neg liver ROS, GERD  Medicated and Controlled,  Endo/Other  Obesity  Renal/GU negative Renal ROS  negative genitourinary   Musculoskeletal   Abdominal (+) + obese,   Peds  Hematology negative hematology ROS (+)   Anesthesia Other Findings   Reproductive/Obstetrics (+) Pregnancy AMA [place] Multiparous                             Anesthesia Physical Anesthesia Plan  ASA: II  Anesthesia Plan: Epidural   Post-op Pain Management:    Induction:   Airway Management Planned: Natural Airway  Additional Equipment:   Intra-op Plan:   Post-operative Plan:   Informed Consent: I have reviewed the patients History and Physical, chart, labs and discussed the procedure including the risks, benefits and alternatives for the proposed anesthesia with the patient or authorized representative who has indicated his/her understanding and acceptance.     Plan Discussed with: Anesthesiologist  Anesthesia Plan Comments:         Anesthesia Quick Evaluation

## 2016-06-29 LAB — CBC
HEMATOCRIT: 33 % — AB (ref 36.0–46.0)
Hemoglobin: 11.5 g/dL — ABNORMAL LOW (ref 12.0–15.0)
MCH: 27.6 pg (ref 26.0–34.0)
MCHC: 34.8 g/dL (ref 30.0–36.0)
MCV: 79.1 fL (ref 78.0–100.0)
PLATELETS: 144 10*3/uL — AB (ref 150–400)
RBC: 4.17 MIL/uL (ref 3.87–5.11)
RDW: 14.6 % (ref 11.5–15.5)
WBC: 11.7 10*3/uL — ABNORMAL HIGH (ref 4.0–10.5)

## 2016-06-29 LAB — RPR: RPR: NONREACTIVE

## 2016-06-29 LAB — GC/CHLAMYDIA PROBE AMP
CHLAMYDIA, DNA PROBE: NEGATIVE
NEISSERIA GONORRHOEAE BY PCR: NEGATIVE

## 2016-06-29 NOTE — Progress Notes (Signed)
UR chart review completed.  

## 2016-06-29 NOTE — Progress Notes (Signed)
CSW acknowledged consult and met with MOB at MOB's bedside.  When CSW arrived MOB was engaging in skin to skin with infant.  MOB was polite, inviting, and interested in meeting with CSW.  MOB requested transportation assistance and a car seat from CSW.  MOB communicated that [facility] could arrange transportation from the hospital if CSW could call and make a request.  CSW agreed to call [facility] to arrange for transporation assistance at d/c.    MOB requested assistance with a car seat.   MOB expressed that MOB has a car seat at home, however, MOB does not have anyone to bring the car seat to the hospital.  CSW shared the cost of purchasing a car seat through [employer] and MOB was interested. CSW processed with MOB other options of arranging to get car seat to hospital prior to infant's d/c.  MOB communicated that MOB will continue to ask friends and church members for assistance with transporting the car seat.  CSW will follow-up with MOB at a later time.    MOB contacted [facility] and was informed to call agency the day of d/c for infant and MOB.  [facility] will provide transportation from the hospital at d/c.   Joyce [name], MSW, LCSW Clinical Social Work [phone] 

## 2016-06-29 NOTE — Progress Notes (Signed)
CSW spoke with MOB via telephone.  MOB called CSW and informed CSW that MOB was able to secure a car seat and will only need transportation assistance at d/c. CSW thanked MOB for the update and informed MOB that CSW will contact [facility] when MOB and infant are ready for d/c.    Laurey Arrow, MSW, LCSW Clinical Social Work [phone]

## 2016-06-29 NOTE — Progress Notes (Signed)
Post Partum Day 1 Subjective: no complaints, up ad lib, voiding and tolerating PO  Objective: Blood pressure 120/73, pulse 78, temperature 98.3 F (36.8 C), temperature source Oral, resp. rate 18, height 5\' 5"  (1.651 m), weight 214 lb (97.1 kg), last menstrual period 11/02/2015, SpO2 100 %, not currently breastfeeding.  Physical Exam:  General: alert, cooperative and no distress Lochia: appropriate Uterine Fundus: firm Incision: healing well DVT Evaluation: No evidence of DVT seen on physical exam.   Recent Labs  06/28/16 1038 06/29/16 0542  HGB 12.7 11.5*  HCT 37.6 33.0*    Assessment/Plan: Plan for discharge tomorrow and Breastfeeding   LOS: 1 day   Hansel Feinstein 06/29/2016, 6:55 AM

## 2016-06-29 NOTE — Lactation Note (Signed)
This note was copied from a baby's chart. Lactation Consultation Note  Patient Name: Joyce [name] [id number] Date: 06/29/2016 Reason for consult: Follow-up assessment   With this mom of an early term baby, now 38 weeks CGA, but small, weight 5 lbs 8.7 oz. Mom is formula feeding, but wants to pump and bottle feed EBM. I set up DEP for mom, and she will ask for her nurse or lactation to assist her with pumping. At this time, mom is getting a massage. Mom has inverted nipples, and flange size will need to be determined. I set up with 24 flanges for now. Mom knows to call for questions/conerns.   Maternal Data    Feeding Feeding Type: Bottle Fed - Formula Nipple Type: Regular  LATCH Score/Interventions                      Lactation Tools Discussed/Used Initiated by:: Bedside RN, and Rondell Reams, IBCLC Date initiated:: 06/29/16   Consult Status Consult Status: Follow-up Date: 06/30/16 Follow-up type: In-patient    Tonna Corner 06/29/2016, 3:26 PM

## 2016-06-29 NOTE — Anesthesia Postprocedure Evaluation (Signed)
Anesthesia Post Note  Patient: Joyce [name]  Procedure(s) Performed: * No procedures listed *  Patient location during evaluation: Mother Baby Anesthesia Type: Epidural Level of consciousness: awake and alert Pain management: pain level controlled Vital Signs Assessment: post-procedure vital signs reviewed and stable Respiratory status: spontaneous breathing and nonlabored ventilation Cardiovascular status: stable Postop Assessment: no headache, no backache, epidural receding, patient able to bend at knees, no signs of nausea or vomiting and adequate PO intake Anesthetic complications: no        Last Vitals:  Vitals:   06/29/16 0009 06/29/16 0545  BP: 121/63 120/73  Pulse: 67 78  Resp: 18 18  Temp: 36.8 C 36.8 C    Last Pain:  Vitals:   06/29/16 0545  TempSrc: Oral  PainSc: 5    Pain Goal: Patients Stated Pain Goal: 2 (06/28/16 2121)               Jabier Mutton

## 2016-06-30 ENCOUNTER — Encounter (HOSPITAL_COMMUNITY): Payer: Self-pay

## 2016-06-30 DIAGNOSIS — Z3A37 37 weeks gestation of pregnancy: Secondary | ICD-10-CM

## 2016-06-30 MED ORDER — SENNOSIDES-DOCUSATE SODIUM 8.6-50 MG PO TABS: 2.0000 | ORAL_TABLET | ORAL | 0 refills | Status: DC

## 2016-06-30 MED ORDER — IBUPROFEN 600 MG PO TABS: 600.0000 mg | ORAL_TABLET | Freq: Four times a day (QID) | ORAL | 0 refills | Status: DC

## 2016-06-30 NOTE — Discharge Instructions (Signed)

## 2016-06-30 NOTE — Lactation Note (Signed)
This note was copied from a baby's chart. Lactation Consultation Note: Mother is mostly bottle feeding. She has a DEBP sat up at the bedside. Mother is active with [facility]. Advised to phone [facility] today after discharge. Mother states she was told that she would be able to get a pump from [facility]. Mother informed to continue to offer breast. Mother states she will breastfeed more when her milk comes in. Mother is aware of available Lactation services . She denies having any questions or concerns.  Patient Name: Joyce [name] [id number] Date: 06/30/2016 Reason for consult: Follow-up assessment   Maternal Data    Feeding    LATCH Score/Interventions                      Lactation Tools Discussed/Used     Consult Status      [name] 06/30/2016, 9:12 AM

## 2016-06-30 NOTE — Discharge Summary (Signed)
OB Discharge Summary     Patient Name: Joyce [name] DOB: 04-14-1976 MRN: [id number]  Date of admission: 06/28/2016 Delivering MD: Joyce [name] [facility]   Date of discharge: 06/30/2016  Admitting diagnosis: 38WKS,BLEEDING Intrauterine pregnancy: [redacted]w[redacted]d     Secondary diagnosis:  Principal Problem:   SVD (spontaneous vaginal delivery) Active Problems:   Normal labor  Additional problems:  Possible chronic hypertension with elevated blood pressures persisting from delivery of her previous child to pregnancy with this one.  AMA     Discharge diagnosis: Term Pregnancy Delivered                                                                                                Post partum procedures:None  Augmentation: Pitocin  Complications: None  Hospital course:  Onset of Labor With Vaginal Delivery     41 y.o. yo [id number] at [redacted]w[redacted]d was admitted in Active Labor on 06/28/2016. Patient had an uncomplicated labor course as follows:  Membrane Rupture Time/Date: 3:20 PM ,06/28/2016   Intrapartum Procedures: Episiotomy: None [1]                                         Lacerations:  None [1]  Patient had a delivery of a Viable infant. 06/28/2016  Information for the patient's newborn:  Joyce, [name] [id number]  Delivery Method: Vag-Spont    Pateint had an uncomplicated postpartum course.  She is ambulating, tolerating a regular diet, passing flatus, and urinating well. Patient is discharged home in stable condition on 06/30/16.   Physical exam  Vitals:   06/29/16 0009 06/29/16 0545 06/29/16 1805 06/30/16 0517  BP: 121/63 120/73 131/69 120/66  Pulse: 67 78 68 64  Resp: 18 18 18 18   Temp: 98.3 F (36.8 C) 98.3 F (36.8 C) 98.1 F (36.7 C) 98.2 F (36.8 C)  TempSrc: Oral Oral Oral   SpO2: 100% 100%    Weight:      Height:       General: alert, cooperative and no distress Lochia: appropriate Uterine Fundus: firm Incision: N/A DVT Evaluation: No evidence of  DVT seen on physical exam. No significant calf/ankle edema. Labs: Lab Results  Component Value Date   WBC 11.7 (H) 06/29/2016   HGB 11.5 (L) 06/29/2016   HCT 33.0 (L) 06/29/2016   MCV 79.1 06/29/2016   PLT 144 (L) 06/29/2016   CMP Latest Ref Rng & Units 06/28/2016  Glucose 65 - 99 mg/dL 83  BUN 6 - 20 mg/dL 7  Creatinine 0.44 - 1.00 mg/dL 0.53  Sodium 135 - 145 mmol/L 135  Potassium 3.5 - 5.1 mmol/L 4.1  Chloride 101 - 111 mmol/L 106  CO2 22 - 32 mmol/L 23  Calcium 8.9 - 10.3 mg/dL 8.7(L)  Total Protein 6.5 - 8.1 g/dL 6.6  Total Bilirubin 0.3 - 1.2 mg/dL 0.8  Alkaline Phos 38 - 126 U/L 88  AST 15 - 41 U/L 17  ALT 14 - 54 U/L 13(L)    Discharge instruction: per  After Visit Summary and "Baby and Me Booklet".  After visit meds:  Allergies as of 06/30/2016   No Known Allergies     Medication List    TAKE these medications   ibuprofen 600 MG tablet Commonly known as:  ADVIL,MOTRIN Take 1 tablet (600 mg total) by mouth every 6 (six) hours.   PRENATAL VITAMIN PLUS LOW IRON 27-1 MG Tabs TAKE ONE TABLET BY MOUTH DAILY AT 12 NOON   senna-docusate 8.6-50 MG tablet Commonly known as:  Senokot-S Take 2 tablets by mouth daily. Start taking on:  07/01/2016      Of note, did not discharge on BP medications as BP was well-controlled while in the hospital. Will require close monitoring post partum after discharge.   Diet: routine diet  Activity: Advance as tolerated. Pelvic rest for 6 weeks.   Outpatient follow up:2 weeks- for pre-op for BTL as well as blood pressure check Follow up Appt:Future Appointments Date Time Provider [place]  07/05/2016 9:15 AM Joyce [name], CNM [facility]   Follow up Visit:No Follow-up on file.  Postpartum contraception: Tubal Ligation- interval  Newborn Data: Live born female  Birth Weight: 5 lb 10.1 oz (2554 g) APGAR: 8, 9  Baby Feeding: Breast and bottle feeding Disposition:home with mother   06/30/2016 Joyce Sickles, MD   CNM attestation I have seen and examined this patient and agree with above documentation in the resident's note.   Joyce [name] is a 41 y.o. [id number] s/p SVD.   Pain is well controlled.  Plan for birth control is bilateral tubal ligation.  Method of Feeding: both  PE:  BP 120/66   Pulse 64   Temp 98.2 F (36.8 C)   Resp 18   Ht 5\' 5"  (1.651 m)   Wt 97.1 kg (214 lb)   LMP 11/02/2015   SpO2 100%   BMI 35.61 kg/m  Fundus firm   Recent Labs  06/28/16 1038 06/29/16 0542  HGB 12.7 11.5*  HCT 37.6 33.0*     Plan: discharge today - postpartum care discussed - f/u clinic in 1 weeks for BP check and pre-op BTL visit   Joyce [name], CNM 9:32 AM  06/30/2016

## 2016-07-05 ENCOUNTER — Encounter: Payer: Medicaid Other | Admitting: Women's Health

## 2016-07-12 ENCOUNTER — Encounter: Payer: Self-pay | Admitting: Advanced Practice Midwife

## 2016-07-12 ENCOUNTER — Ambulatory Visit (INDEPENDENT_AMBULATORY_CARE_PROVIDER_SITE_OTHER): Payer: Medicaid Other | Admitting: Advanced Practice Midwife

## 2016-07-12 VITALS — BP 142/82 | HR 72 | Ht 65.0 in | Wt 205.0 lb

## 2016-07-12 DIAGNOSIS — Z0131 Encounter for examination of blood pressure with abnormal findings: Secondary | ICD-10-CM | POA: Diagnosis not present

## 2016-07-12 DIAGNOSIS — O1002 Pre-existing essential hypertension complicating childbirth: Secondary | ICD-10-CM

## 2016-07-12 DIAGNOSIS — Z3009 Encounter for other general counseling and advice on contraception: Secondary | ICD-10-CM | POA: Diagnosis not present

## 2016-07-12 MED ORDER — MEDROXYPROGESTERONE ACETATE 150 MG/ML IM SUSP: 150.0000 mg | INTRAMUSCULAR | 3 refills | Status: DC

## 2016-07-12 MED ORDER — AMLODIPINE BESYLATE 5 MG PO TABS
5.0000 mg | ORAL_TABLET | Freq: Every day | ORAL | 6 refills | Status: DC
Start: 2016-07-12 — End: 2016-12-07

## 2016-07-12 NOTE — Progress Notes (Signed)
[facility] Visit  Patient name: Joyce [name] MRN [id number]  Date of birth: 1975-11-26  CC & HPI:  Joyce [name] is a 41 y.o. African American female presenting today for BP check. Also wants to go ahead and start depo Pt has not been on BP meds outside of pregnanty, but states "I had HTN w/last baby that never went way"  So of course, this is probably [facility], but will need to prove it.  BP normalized right after deliver on 2/1, not sent home on meds  Pertinent History Reviewed:  Medical & Surgical Hx:   Past Medical History:  Diagnosis Date  . AMA (advanced maternal age) multigravida 35+ 11/12/2014  . Migraine   . Pregnant 11/12/2014  . Round ligament pain 11/12/2014  . Short of breath on exertion 11/12/2014  . Trichomonas infection    Past Surgical History:  Procedure Laterality Date  . CHOLECYSTECTOMY     Family History  Problem Relation Age of Onset  . Diabetes Mother   . Hypertension Mother   . Kidney disease Mother   . Alzheimer's disease Father   . Kidney disease Sister   . Asthma Son   . Diabetes Sister   . Asthma Son   . Heart murmur Son   . Asthma Son     Current Outpatient Prescriptions:  .  ibuprofen (ADVIL,MOTRIN) 600 MG tablet, Take 1 tablet (600 mg total) by mouth every 6 (six) hours., Disp: 30 tablet, Rfl: 0 .  Prenatal Vit-Fe Fumarate-FA (PRENATAL VITAMIN PLUS LOW IRON) 27-1 MG TABS, TAKE ONE TABLET BY MOUTH DAILY AT 12 NOON, Disp: 267 tablet, Rfl: 2 .  amLODipine (NORVASC) 5 MG tablet, Take 1 tablet (5 mg total) by mouth daily., Disp: 30 tablet, Rfl: 6 .  medroxyPROGESTERone (DEPO-PROVERA) 150 MG/ML injection, Inject 1 mL (150 mg total) into the muscle every 3 (three) months., Disp: 1 mL, Rfl: 3 .  senna-docusate (SENOKOT-S) 8.6-50 MG tablet, Take 2 tablets by mouth daily. (Patient not taking: Reported on 07/12/2016), Disp: 30 tablet, Rfl: 0 Social History: Reviewed -  reports that she has never smoked. She has never used smokeless  tobacco.  Review of Systems:   Constitutional: Negative for fever and chills Eyes: Negative for visual disturbances Respiratory: Negative for shortness of breath, dyspnea Cardiovascular: Negative for chest pain or palpitations  Gastrointestinal: Negative for vomiting, diarrhea and constipation; no abdominal pain Genitourinary: Negative for dysuria and urgency, vaginal irritation or itching Musculoskeletal: Negative for back pain, joint pain, myalgias  Neurological: Negative for dizziness and headaches    Objective Findings:    Physical Examination: General appearance - well appearing, and in no distress Mental status - alert, oriented to person, place, and time Chest:  Normal respiratory effort Heart - normal rate and regular rhythm Abdomen:  Soft, nontender Pelvic: deferred Musculoskeletal:  Normal range of motion without pain Extremities:  No edema    Results for orders placed or performed in visit on 07/13/16 (from the past 24 hour(s))  POCT urine pregnancy   Collection Time: 07/13/16 12:07 PM  Result Value Ref Range   Preg Test, Ur Negative Negative      Assessment & Plan:  A:    Meds ordered this encounter  Medications  . medroxyPROGESTERone (DEPO-PROVERA) 150 MG/ML injection    Sig: Inject 1 mL (150 mg total) into the muscle every 3 (three) months.    Dispense:  1 mL    Refill:  3    Order Specific Question:  Supervising Provider    Answer:   Tania Ade H [[id number]]  . amLODipine (NORVASC) 5 MG tablet    Sig: Take 1 tablet (5 mg total) by mouth daily.    Dispense:  30 tablet    Refill:  6    Order Specific Question:   Supervising Provider    Answer:   Tania Ade H [[id number]]    P   Stop meds 3 days before pp check up     Return for will come back later for depo injection; 1 week for BP check.  CRESENZO-DISHMAN,Dayshia Ballinas CNM 07/13/2016 12:15 PM

## 2016-07-13 ENCOUNTER — Ambulatory Visit (INDEPENDENT_AMBULATORY_CARE_PROVIDER_SITE_OTHER): Payer: Medicaid Other

## 2016-07-13 VITALS — Wt 202.0 lb

## 2016-07-13 DIAGNOSIS — Z3202 Encounter for pregnancy test, result negative: Secondary | ICD-10-CM

## 2016-07-13 DIAGNOSIS — Z3042 Encounter for surveillance of injectable contraceptive: Secondary | ICD-10-CM | POA: Diagnosis not present

## 2016-07-13 LAB — POCT URINE PREGNANCY: Preg Test, Ur: NEGATIVE

## 2016-07-13 MED ORDER — MEDROXYPROGESTERONE ACETATE 150 MG/ML IM SUSP
150.0000 mg | Freq: Once | INTRAMUSCULAR | Status: AC
  Administered 2016-07-13: 150 mg via INTRAMUSCULAR

## 2016-07-13 NOTE — Progress Notes (Signed)
Pt here today for Depo Shot 150 mg IM Left Deltoid.Tolerated well.Return 12 weeks for next shot.P [name] CMA

## 2016-07-20 ENCOUNTER — Encounter: Payer: Self-pay | Admitting: Adult Health

## 2016-07-20 ENCOUNTER — Ambulatory Visit (INDEPENDENT_AMBULATORY_CARE_PROVIDER_SITE_OTHER): Payer: Medicaid Other | Admitting: Adult Health

## 2016-07-20 VITALS — BP 136/84 | HR 82 | Ht 65.0 in | Wt 203.5 lb

## 2016-07-20 DIAGNOSIS — F53 Postpartum depression: Secondary | ICD-10-CM

## 2016-07-20 DIAGNOSIS — Z0131 Encounter for examination of blood pressure with abnormal findings: Secondary | ICD-10-CM | POA: Diagnosis not present

## 2016-07-20 DIAGNOSIS — Z1389 Encounter for screening for other disorder: Secondary | ICD-10-CM

## 2016-07-20 DIAGNOSIS — I10 Essential (primary) hypertension: Secondary | ICD-10-CM | POA: Insufficient documentation

## 2016-07-20 DIAGNOSIS — O99345 Other mental disorders complicating the puerperium: Secondary | ICD-10-CM

## 2016-07-20 MED ORDER — HYDROCHLOROTHIAZIDE 12.5 MG PO CAPS: 12.5000 mg | ORAL_CAPSULE | Freq: Every day | ORAL | 6 refills | Status: DC

## 2016-07-20 MED ORDER — ESCITALOPRAM OXALATE 10 MG PO TABS: 10.0000 mg | ORAL_TABLET | Freq: Every day | ORAL | 6 refills | Status: DC

## 2016-07-20 NOTE — Patient Instructions (Signed)
DASH Eating Plan DASH stands for "Dietary Approaches to Stop Hypertension." The DASH eating plan is a healthy eating plan that has been shown to reduce high blood pressure (hypertension). Additional health benefits may include reducing the risk of type 2 diabetes mellitus, heart disease, and stroke. The DASH eating plan may also help with weight loss. What do I need to know about the DASH eating plan? For the DASH eating plan, you will follow these general guidelines:  Choose foods with less than 150 milligrams of sodium per serving (as listed on the food label).  Use salt-free seasonings or herbs instead of table salt or sea salt.  Check with your health care provider or pharmacist before using salt substitutes.  Eat lower-sodium products. These are often labeled as "low-sodium" or "no salt added."  Eat fresh foods. Avoid eating a lot of canned foods.  Eat more vegetables, fruits, and low-fat dairy products.  Choose whole grains. Look for the word "whole" as the first word in the ingredient list.  Choose fish and skinless chicken or turkey more often than red meat. Limit fish, poultry, and meat to 6 oz (170 g) each day.  Limit sweets, desserts, sugars, and sugary drinks.  Choose heart-healthy fats.  Eat more home-cooked food and less restaurant, buffet, and fast food.  Limit fried foods.  Do not fry foods. Cook foods using methods such as baking, boiling, grilling, and broiling instead.  When eating at a restaurant, ask that your food be prepared with less salt, or no salt if possible. What foods can I eat? Seek help from a dietitian for individual calorie needs. Grains  Whole grain or whole wheat bread. Brown rice. Whole grain or whole wheat pasta. Quinoa, bulgur, and whole grain cereals. Low-sodium cereals. Corn or whole wheat flour tortillas. Whole grain cornbread. Whole grain crackers. Low-sodium crackers. Vegetables  Fresh or frozen vegetables (raw, steamed, roasted, or  grilled). Low-sodium or reduced-sodium tomato and vegetable juices. Low-sodium or reduced-sodium tomato sauce and paste. Low-sodium or reduced-sodium canned vegetables. Fruits  All fresh, canned (in natural juice), or frozen fruits. Meat and Other Protein Products  Ground beef (85% or leaner), grass-fed beef, or beef trimmed of fat. Skinless chicken or turkey. Ground chicken or turkey. Pork trimmed of fat. All fish and seafood. Eggs. Dried beans, peas, or lentils. Unsalted nuts and seeds. Unsalted canned beans. Dairy  Low-fat dairy products, such as skim or 1% milk, 2% or reduced-fat cheeses, low-fat ricotta or cottage cheese, or plain low-fat yogurt. Low-sodium or reduced-sodium cheeses. Fats and Oils  Tub margarines without trans fats. Light or reduced-fat mayonnaise and salad dressings (reduced sodium). Avocado. Safflower, olive, or canola oils. Natural peanut or almond butter. Other  Unsalted popcorn and pretzels. The items listed above may not be a complete list of recommended foods or beverages. Contact your dietitian for more options.  What foods are not recommended? Grains  White bread. White pasta. White rice. Refined cornbread. Bagels and croissants. Crackers that contain trans fat. Vegetables  Creamed or fried vegetables. Vegetables in a cheese sauce. Regular canned vegetables. Regular canned tomato sauce and paste. Regular tomato and vegetable juices. Fruits  Canned fruit in light or heavy syrup. Fruit juice. Meat and Other Protein Products  Fatty cuts of meat. Ribs, chicken wings, bacon, sausage, bologna, salami, chitterlings, fatback, hot dogs, bratwurst, and packaged luncheon meats. Salted nuts and seeds. Canned beans with salt. Dairy  Whole or 2% milk, cream, half-and-half, and cream cheese. Whole-fat or sweetened yogurt. Full-fat cheeses   or blue cheese. Nondairy creamers and whipped toppings. Processed cheese, cheese spreads, or cheese curds. Condiments  Onion and garlic  salt, seasoned salt, table salt, and sea salt. Canned and packaged gravies. Worcestershire sauce. Tartar sauce. Barbecue sauce. Teriyaki sauce. Soy sauce, including reduced sodium. Steak sauce. Fish sauce. Oyster sauce. Cocktail sauce. Horseradish. Ketchup and mustard. Meat flavorings and tenderizers. Bouillon cubes. Hot sauce. Tabasco sauce. Marinades. Taco seasonings. Relishes. Fats and Oils  Butter, stick margarine, lard, shortening, ghee, and bacon fat. Coconut, palm kernel, or palm oils. Regular salad dressings. Other  Pickles and olives. Salted popcorn and pretzels. The items listed above may not be a complete list of foods and beverages to avoid. Contact your dietitian for more information.  Where can I find more information? National Heart, Lung, and Blood Institute: www.nhlbi.nih.gov/health/health-topics/topics/dash/ This information is not intended to replace advice given to you by your health care provider. Make sure you discuss any questions you have with your health care provider. Document Released: 05/05/2011 Document Revised: 10/22/2015 Document Reviewed: 03/20/2013 Elsevier Interactive Patient Education  2017 Elsevier Inc.  

## 2016-07-20 NOTE — Progress Notes (Signed)
Subjective:     Patient ID: Joyce [name], female   DOB: 1975/09/27, 41 y.o.   MRN: [id number]  HPI Joyce [name] is a 41 year old black female, who had delivery 06/28/16 and was placed on Norvasc for BP 2/13, and then she received depo 2/14, in for BP check.   Review of Systems Patient denies any headaches, hearing loss, fatigue, blurred vision, shortness of breath, chest pain, abdominal pain, problems with bowel movements, urination, or intercourse. No joint pain, +depressed. Reviewed past medical,surgical, social and family history. Reviewed medications and allergies.     Objective:   Physical Exam BP 136/84 (BP Location: Left Arm, Patient Position: Sitting, Cuff Size: Large)   Pulse 82   Ht 5\' 5"  (1.651 m)   Wt 203 lb 8 oz (92.3 kg)   Breastfeeding? No   BMI 33.86 kg/m  Skin warm and dry.  Lungs: clear to ausculation bilaterally. Cardiovascular: regular rate and rhythm.+ swelling in hands,fingers and ankles, can see indents from socks. PHQ 9 score 13, says she is depressed, and teary when asked about it, denies any suicidal ideations, and is open to taking meds. Will add Microzide to Norvasc and will Rx lexapro for postpartum depression.Also discused decreasing salt and sugar from diet. Take time for self and ask family to help. Face time 15 minutes with 50 % counseling as above.    Assessment:     1. Essential hypertension   2. Postpartum depression       Plan:     Meds ordered this encounter  Medications  . hydrochlorothiazide (MICROZIDE) 12.5 MG capsule    Sig: Take 1 capsule (12.5 mg total) by mouth daily.    Dispense:  30 capsule    Refill:  6    Order Specific Question:   Supervising Provider    Answer:   Elonda Husky, LUTHER H [[id number]]  . escitalopram (LEXAPRO) 10 MG tablet    Sig: Take 1 tablet (10 mg total) by mouth daily.    Dispense:  30 tablet    Refill:  6    Order Specific Question:   Supervising Provider    Answer:   Florian Buff [[id number]]  Follow up as scheduled  3/14 with Manus Gunning for postpartum visit Take time for self Decrease salt and sugar  Review handout on DASH diet  Call me with any questions or concerns

## 2016-07-25 ENCOUNTER — Encounter (HOSPITAL_COMMUNITY): Payer: Self-pay | Admitting: Emergency Medicine

## 2016-07-25 ENCOUNTER — Emergency Department (HOSPITAL_COMMUNITY)
Admission: EM | Admit: 2016-07-25 | Discharge: 2016-07-25 | Disposition: A | Discharge status: Home / Self Care | Payer: Medicaid Other | Attending: Emergency Medicine | Admitting: Emergency Medicine

## 2016-07-25 DIAGNOSIS — T2017XA Burn of first degree of neck, initial encounter: Secondary | ICD-10-CM | POA: Insufficient documentation

## 2016-07-25 DIAGNOSIS — T300 Burn of unspecified body region, unspecified degree: Secondary | ICD-10-CM

## 2016-07-25 DIAGNOSIS — T24111A Burn of first degree of right thigh, initial encounter: Secondary | ICD-10-CM | POA: Diagnosis not present

## 2016-07-25 DIAGNOSIS — T23132A Burn of first degree of multiple left fingers (nail), not including thumb, initial encounter: Secondary | ICD-10-CM | POA: Diagnosis not present

## 2016-07-25 DIAGNOSIS — Y9389 Activity, other specified: Secondary | ICD-10-CM | POA: Insufficient documentation

## 2016-07-25 DIAGNOSIS — X118XXA Contact with other hot tap-water, initial encounter: Secondary | ICD-10-CM | POA: Insufficient documentation

## 2016-07-25 DIAGNOSIS — Y999 Unspecified external cause status: Secondary | ICD-10-CM | POA: Insufficient documentation

## 2016-07-25 DIAGNOSIS — Y92009 Unspecified place in unspecified non-institutional (private) residence as the place of occurrence of the external cause: Secondary | ICD-10-CM | POA: Diagnosis not present

## 2016-07-25 DIAGNOSIS — T2121XA Burn of second degree of chest wall, initial encounter: Secondary | ICD-10-CM | POA: Insufficient documentation

## 2016-07-25 DIAGNOSIS — Z79899 Other long term (current) drug therapy: Secondary | ICD-10-CM | POA: Diagnosis not present

## 2016-07-25 DIAGNOSIS — S299XXA Unspecified injury of thorax, initial encounter: Secondary | ICD-10-CM | POA: Diagnosis present

## 2016-07-25 MED ORDER — IBUPROFEN 600 MG PO TABS: 600.0000 mg | ORAL_TABLET | Freq: Four times a day (QID) | ORAL | 0 refills | Status: DC

## 2016-07-25 MED ORDER — HYDROCODONE-ACETAMINOPHEN 5-325 MG PO TABS: 1.0000 | ORAL_TABLET | ORAL | 0 refills | Status: DC | PRN

## 2016-07-25 MED ORDER — HYDROCODONE-ACETAMINOPHEN 5-325 MG PO TABS
2.0000 | ORAL_TABLET | Freq: Once | ORAL | Status: AC
  Administered 2016-07-25: 2 via ORAL
  Filled 2016-07-25: qty 2

## 2016-07-25 MED ORDER — IBUPROFEN 800 MG PO TABS
800.0000 mg | ORAL_TABLET | Freq: Once | ORAL | Status: AC
  Administered 2016-07-25: 800 mg via ORAL
  Filled 2016-07-25: qty 1

## 2016-07-25 MED ORDER — SILVER SULFADIAZINE 1 % EX CREA
TOPICAL_CREAM | Freq: Once | CUTANEOUS | Status: AC
  Administered 2016-07-25: 1 via TOPICAL
  Filled 2016-07-25: qty 50

## 2016-07-25 MED ORDER — ONDANSETRON HCL 4 MG PO TABS
4.0000 mg | ORAL_TABLET | Freq: Once | ORAL | Status: AC
  Administered 2016-07-25: 4 mg via ORAL
  Filled 2016-07-25: qty 1
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
## 2016-07-29 ENCOUNTER — Encounter (HOSPITAL_COMMUNITY): Payer: Self-pay

## 2016-07-29 ENCOUNTER — Emergency Department (HOSPITAL_COMMUNITY)
Admission: EM | Admit: 2016-07-29 | Discharge: 2016-07-29 | Disposition: A | Discharge status: Home / Self Care | Payer: Medicaid Other | Attending: Emergency Medicine | Admitting: Emergency Medicine

## 2016-07-29 DIAGNOSIS — I10 Essential (primary) hypertension: Secondary | ICD-10-CM | POA: Insufficient documentation

## 2016-07-29 DIAGNOSIS — Z79899 Other long term (current) drug therapy: Secondary | ICD-10-CM | POA: Insufficient documentation

## 2016-07-29 DIAGNOSIS — Z7982 Long term (current) use of aspirin: Secondary | ICD-10-CM | POA: Insufficient documentation

## 2016-07-29 DIAGNOSIS — Z4800 Encounter for change or removal of nonsurgical wound dressing: Secondary | ICD-10-CM | POA: Diagnosis not present

## 2016-07-29 DIAGNOSIS — T300 Burn of unspecified body region, unspecified degree: Secondary | ICD-10-CM

## 2016-07-29 MED ORDER — OXYCODONE-ACETAMINOPHEN 5-325 MG PO TABS
1.0000 | ORAL_TABLET | Freq: Once | ORAL | Status: AC
Start: 2016-07-29 — End: 2016-07-29
  Administered 2016-07-29: 1 via ORAL
  Filled 2016-07-29: qty 1

## 2016-07-29 MED ORDER — SILVER SULFADIAZINE 1 % EX CREA
TOPICAL_CREAM | Freq: Once | CUTANEOUS | Status: AC
Start: 2016-07-29 — End: 2016-07-29
  Administered 2016-07-29: 1 via TOPICAL
  Filled 2016-07-29: qty 50

## 2016-07-29 MED ORDER — OXYCODONE-ACETAMINOPHEN 5-325 MG PO TABS: 1.0000 | ORAL_TABLET | Freq: Four times a day (QID) | ORAL | 0 refills | Status: DC | PRN

## 2016-07-29 NOTE — ED Notes (Signed)
Pt verbalized understanding of no driving and to use caution within 4 hours of taking pain meds due to meds cause drowsiness 

## 2016-07-29 NOTE — Discharge Instructions (Signed)
Follow up with dr. Wolfgang Phoenix next week for recheck. Take Percocet for pain and usually her Silvadene daily to your
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
## 2016-07-29 NOTE — ED Notes (Signed)
This nurse present during EDP's exam of pt

## 2016-08-10 ENCOUNTER — Encounter: Payer: Self-pay | Admitting: Advanced Practice Midwife

## 2016-08-10 ENCOUNTER — Ambulatory Visit (INDEPENDENT_AMBULATORY_CARE_PROVIDER_SITE_OTHER): Payer: Medicaid Other | Admitting: Advanced Practice Midwife

## 2016-08-10 MED ORDER — SENNOSIDES-DOCUSATE SODIUM 8.6-50 MG PO TABS: 2.0000 | ORAL_TABLET | ORAL | 0 refills | Status: DC

## 2016-08-10 MED ORDER — MEGESTROL ACETATE 40 MG PO TABS: ORAL_TABLET | ORAL | 3 refills | Status: DC
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
## 2016-08-25 ENCOUNTER — Telehealth: Payer: Self-pay | Admitting: *Deleted

## 2016-10-05 ENCOUNTER — Emergency Department (HOSPITAL_COMMUNITY)
Admission: EM | Admit: 2016-10-05 | Discharge: 2016-10-05 | Disposition: A | Discharge status: Home / Self Care | Payer: Medicaid Other | Attending: Emergency Medicine | Admitting: Emergency Medicine

## 2016-10-05 ENCOUNTER — Ambulatory Visit (INDEPENDENT_AMBULATORY_CARE_PROVIDER_SITE_OTHER): Payer: Medicaid Other

## 2016-10-05 ENCOUNTER — Encounter (HOSPITAL_COMMUNITY): Payer: Self-pay | Admitting: *Deleted

## 2016-10-05 ENCOUNTER — Emergency Department (HOSPITAL_COMMUNITY): Payer: Medicaid Other

## 2016-10-05 VITALS — Wt 221.0 lb

## 2016-10-05 DIAGNOSIS — Z79899 Other long term (current) drug therapy: Secondary | ICD-10-CM | POA: Diagnosis not present

## 2016-10-05 DIAGNOSIS — Z3042 Encounter for surveillance of injectable contraceptive: Secondary | ICD-10-CM | POA: Diagnosis not present

## 2016-10-05 DIAGNOSIS — Z3202 Encounter for pregnancy test, result negative: Secondary | ICD-10-CM | POA: Diagnosis not present

## 2016-10-05 DIAGNOSIS — I10 Essential (primary) hypertension: Secondary | ICD-10-CM | POA: Insufficient documentation

## 2016-10-05 DIAGNOSIS — M25562 Pain in left knee: Secondary | ICD-10-CM | POA: Insufficient documentation

## 2016-10-05 LAB — POCT URINE PREGNANCY: PREG TEST UR: NEGATIVE

## 2016-10-05 IMAGING — DX DG KNEE COMPLETE 4+V*L*
4 series · 5 of 5 positions shown · non-contrast
Comparison: None.

CLINICAL DATA: Left knee pain and inability to bear weight for 2
days. No known injury.

EXAM:
LEFT KNEE - COMPLETE 4+ VIEW

[knee ap]
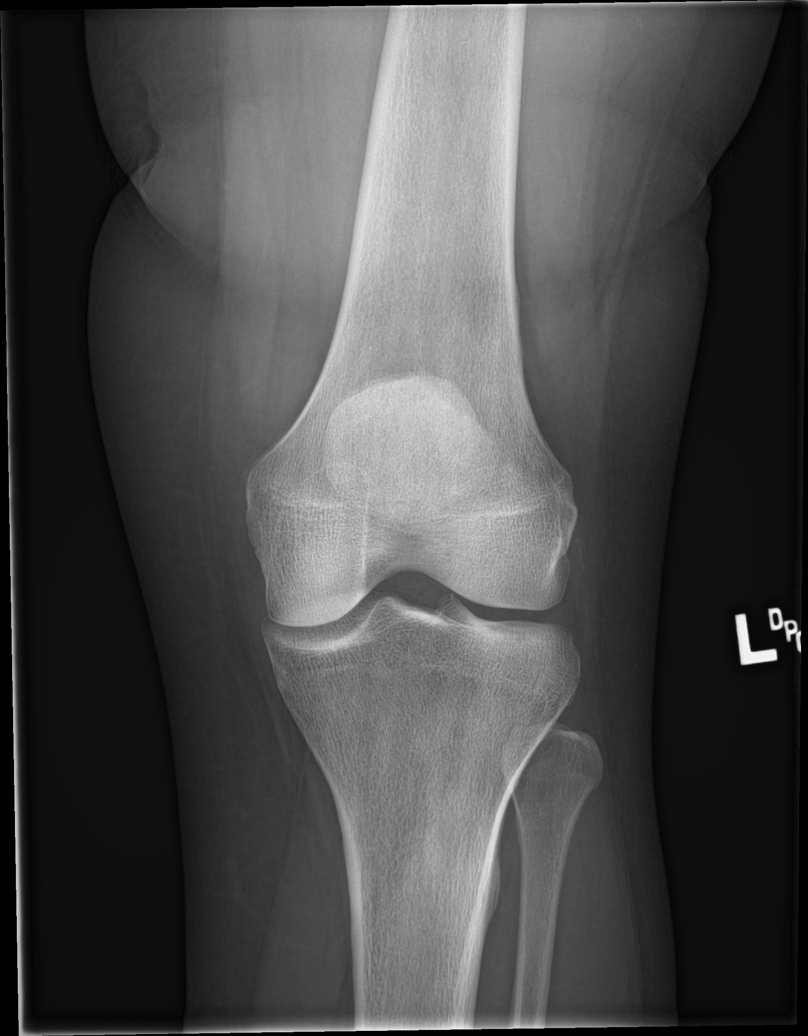

[knee obl (1 of 2)]
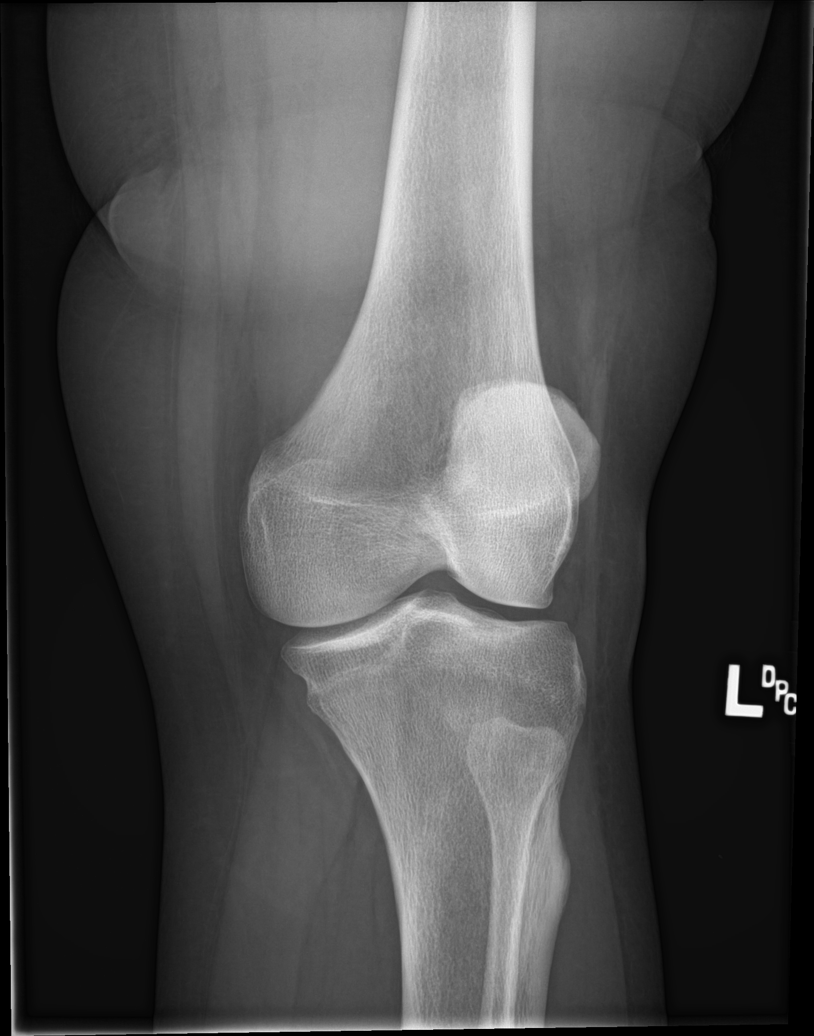

[knee obl (2 of 2)]
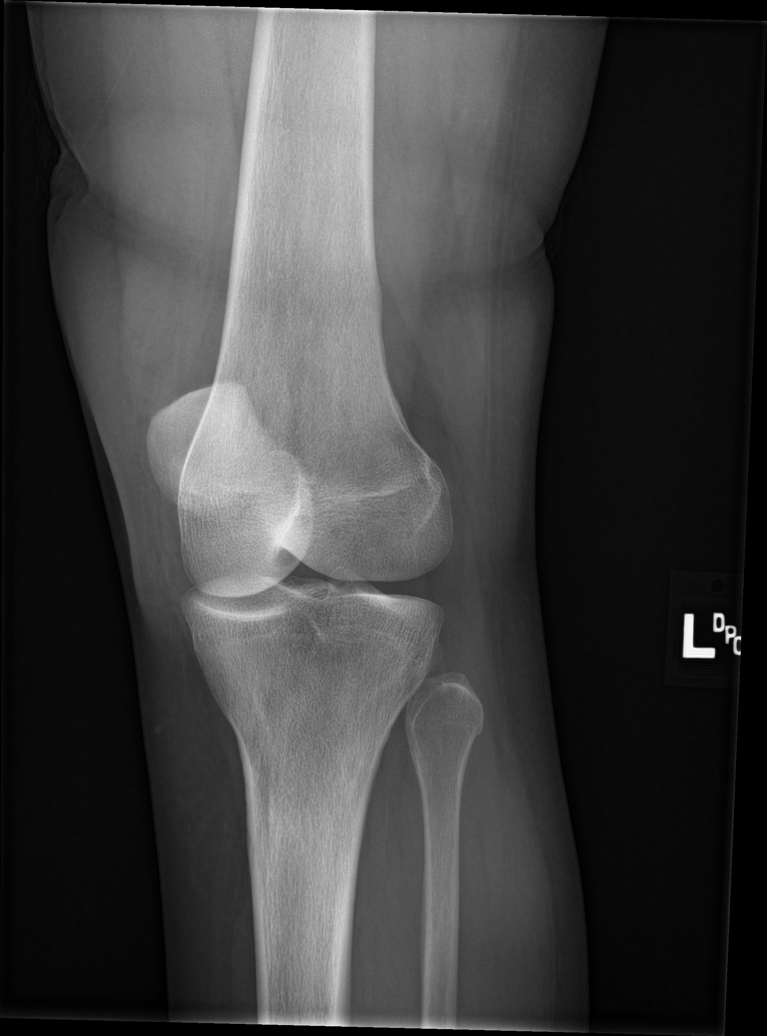

[Series 4: knee lat · 0.14mm/px · 2 of 2 slices shown]
[im 1/2]
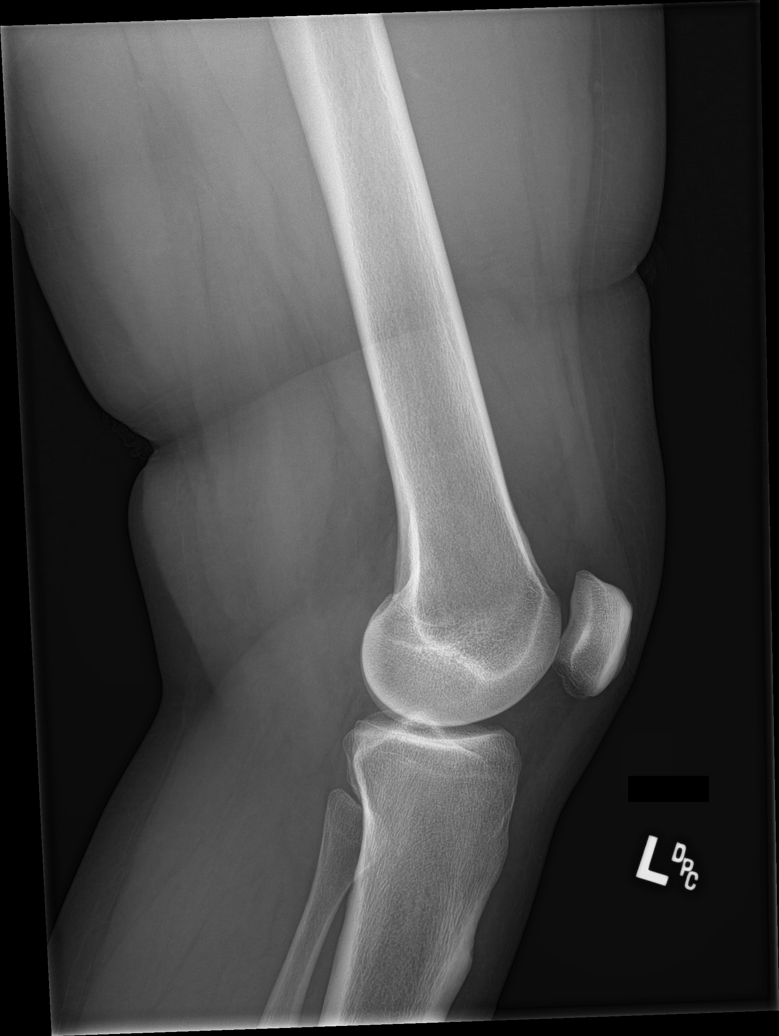
[im 2/2]
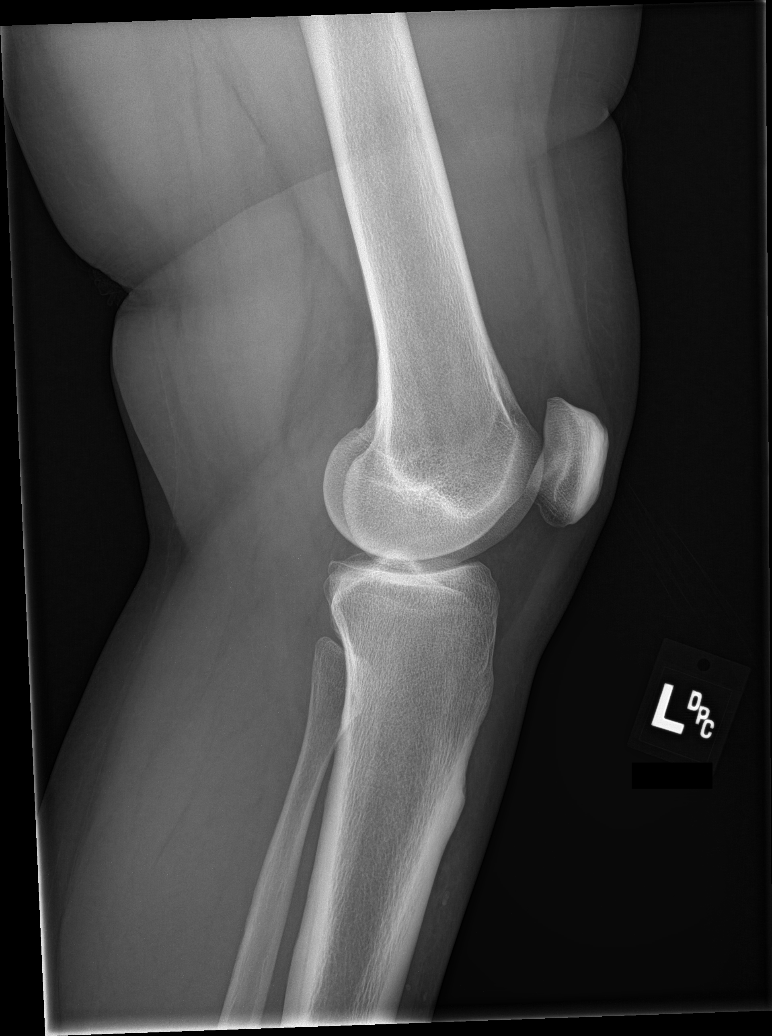

[5 of 5 positions shown; findings below may reference images not displayed]

FINDINGS: No evidence of fracture, dislocation, or joint effusion. No evidence
of arthropathy or other focal bone abnormality. Soft tissues are
unremarkable.
IMPRESSION: Negative.

## 2016-10-05 MED ORDER — HYDROCODONE-ACETAMINOPHEN 5-325 MG PO TABS
2.0000 | ORAL_TABLET | Freq: Once | ORAL | Status: AC
  Administered 2016-10-05: 2 via ORAL
  Filled 2016-10-05: qty 2

## 2016-10-05 MED ORDER — MEDROXYPROGESTERONE ACETATE 150 MG/ML IM SUSP
150.0000 mg | Freq: Once | INTRAMUSCULAR | Status: AC
  Administered 2016-10-05: 150 mg via INTRAMUSCULAR

## 2016-10-05 MED ORDER — HYDROCODONE-ACETAMINOPHEN 5-325 MG PO TABS: 1.0000 | ORAL_TABLET | ORAL | 0 refills | Status: DC | PRN

## 2016-10-05 MED ORDER — KETOROLAC TROMETHAMINE 10 MG PO TABS
10.0000 mg | ORAL_TABLET | Freq: Once | ORAL | Status: AC
  Administered 2016-10-05: 10 mg via ORAL
  Filled 2016-10-05: qty 1

## 2016-10-05 MED ORDER — ONDANSETRON HCL 4 MG PO TABS
4.0000 mg | ORAL_TABLET | Freq: Once | ORAL | Status: AC
Start: 2016-10-05 — End: 2016-10-05
  Administered 2016-10-05: 4 mg via ORAL
  Filled 2016-10-05: qty 1

## 2016-10-05 MED ORDER — IBUPROFEN 600 MG PO TABS: 600.0000 mg | ORAL_TABLET | Freq: Four times a day (QID) | ORAL | 0 refills | Status: DC

## 2016-10-05 NOTE — Discharge Instructions (Signed)
A plain x-ray of your knee is negative for fracture or dislocation or fluid in the joint. Please see Dr. Aline Brochure, or the orthopedic specialist of your choice for orthopedic management of this pain  problem. Please use your knee immobilizer when up and about. You do not need to sleep in this device. Please use your crutches when up and about to minimize the weight on the knee until you're seen by orthopedics. Please use ibuprofen with breakfast, lunch, dinner, and at bedtime. May use Norco for more severe pain. Norco may cause drowsiness, please use this medication with caution.

## 2016-10-05 NOTE — ED Provider Notes (Signed)
[facility] DEPT Provider Note   CSN: [id number] Arrival date & time: 10/05/16  1758     History   Chief Complaint Chief Complaint  Patient presents with  . Knee Pain    HPI Joyce [name] is a 41 y.o. female.  The history is provided by the patient.  Knee Pain   The current episode started 2 days ago. The problem occurs hourly. The problem has been gradually worsening. The pain is present in the left knee. The pain is moderate. Associated symptoms include limited range of motion and stiffness. Pertinent negatives include no numbness. The symptoms are aggravated by standing. She has tried nothing for the symptoms. There has been no history of extremity trauma.    Past Medical History:  Diagnosis Date  . AMA (advanced maternal age) multigravida 35+ 11/12/2014  . Migraine   . Pregnant 11/12/2014  . Round ligament pain 11/12/2014  . Short of breath on exertion 11/12/2014  . Trichomonas infection     Patient Active Problem List   Diagnosis Date Noted  . Essential hypertension 07/20/2016  . SVD (spontaneous vaginal delivery) 06/30/2016  . Normal labor 06/29/2016  . General counseling and advice on female contraception postpartum salpingectomy at 4 wk 04/20/2016  . Supervision of normal pregnancy 01/27/2016  . Uterine fibroid 01/27/2016  . Rubella non-immune status, antepartum 12/08/2014  . 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 [place] multiparity 11/26/2014  . AMA (advanced maternal age) multigravida 35+ 11/12/2014    Past Surgical History:  Procedure Laterality Date  . CHOLECYSTECTOMY      OB History    Gravida Para Term Preterm AB Living   11 8 8  0 3 8   SAB TAB Ectopic Multiple Live Births   1 2   0 8       Home Medications    Prior to Admission medications   Medication Sig Start Date End Date Taking? Authorizing Provider  amLODipine (NORVASC) 5 MG tablet Take 1 tablet (5 mg total) by mouth daily. 07/12/16    Cresenzo-Dishmon, Joaquim Lai, CNM  escitalopram (LEXAPRO) 10 MG tablet Take 1 tablet (10 mg total) by mouth daily. 07/20/16   Estill Dooms, NP  hydrochlorothiazide (MICROZIDE) 12.5 MG capsule Take 1 capsule (12.5 mg total) by mouth daily. 07/20/16   Estill Dooms, NP  HYDROcodone-acetaminophen (NORCO/VICODIN) 5-325 MG tablet Take 1 tablet by mouth every 4 (four) hours as needed. 07/25/16   Lily Kocher, PA-C  ibuprofen (ADVIL,MOTRIN) 600 MG tablet Take 1 tablet (600 mg total) by mouth 4 (four) times daily. 07/25/16   Lily Kocher, PA-C  medroxyPROGESTERone (DEPO-PROVERA) 150 MG/ML injection Inject 1 mL (150 mg total) into the muscle every 3 (three) months. 07/12/16   Cresenzo-Dishmon, Joaquim Lai, CNM  megestrol (MEGACE) 40 MG tablet Take 3/day (at the same time) for 5 days; 2/day for 5 days, then 1/day PO prn bleeding 08/10/16   Cresenzo-Dishmon, Joaquim Lai, CNM  Prenatal Vit-Fe Fumarate-FA (PRENATAL VITAMIN PLUS LOW IRON) 27-1 MG TABS TAKE ONE TABLET BY MOUTH DAILY AT 12 NOON 12/24/15   Derrek Monaco A, NP  senna-docusate (SENOKOT-S) 8.6-50 MG tablet Take 2 tablets by mouth daily. 08/10/16   Christin Fudge, CNM    Family History Family History  Problem Relation Age of Onset  . Diabetes Mother   . Hypertension Mother   . Kidney disease Mother   . Alzheimer's disease Father   . Kidney disease Sister   . Asthma Son   . Diabetes Sister   .  Asthma Son   . Heart murmur Son   . Asthma Son     Social History Social History  Substance Use Topics  . Smoking status: Never Smoker  . Smokeless tobacco: Never Used  . Alcohol use No     Allergies   Patient has no known allergies.   Review of Systems Review of Systems  Constitutional: Negative for activity change.       All ROS Neg except as noted in HPI  HENT: Negative for nosebleeds.   Eyes: Negative for photophobia and discharge.  Respiratory: Negative for cough, shortness of breath and wheezing.   Cardiovascular:  Negative for chest pain and palpitations.  Gastrointestinal: Negative for abdominal pain and blood in stool.  Genitourinary: Negative for dysuria, frequency and hematuria.  Musculoskeletal: Positive for arthralgias and stiffness. Negative for back pain and neck pain.       Knee pain  Skin: Negative.   Neurological: Negative for dizziness, seizures, speech difficulty and numbness.  Psychiatric/Behavioral: Negative for confusion and hallucinations.     Physical Exam Updated Vital Signs BP (!) 150/90 (BP Location: Right Arm)   Pulse 89   Temp 97.8 F (36.6 C) (Temporal)   Resp 18   Ht 5\' 5"  (1.651 m)   Wt 99.8 kg   SpO2 100%   BMI 36.61 kg/m   Physical Exam  Constitutional: She is oriented to person, place, and time. She appears well-developed and well-nourished.  Non-toxic appearance.  HENT:  Head: Normocephalic.  Right Ear: Tympanic membrane and external ear normal.  Left Ear: Tympanic membrane and external ear normal.  Eyes: EOM and lids are normal. Pupils are equal, round, and reactive to light.  Neck: Normal range of motion. Neck supple. Carotid bruit is not present.  Cardiovascular: Normal rate, regular rhythm, normal heart sounds, intact distal pulses and normal pulses.   Pulmonary/Chest: Breath sounds normal. No respiratory distress.  Abdominal: Soft. Bowel sounds are normal. There is no tenderness. There is no guarding.  Musculoskeletal:       Left knee: She exhibits decreased range of motion. Tenderness found. Patellar tendon tenderness noted.  Lymphadenopathy:       Head (right side): No submandibular adenopathy present.       Head (left side): No submandibular adenopathy present.    She has no cervical adenopathy.  Neurological: She is alert and oriented to person, place, and time. She has normal strength. No cranial nerve deficit or sensory deficit.  Skin: Skin is warm and dry.  Psychiatric: She has a normal mood and affect. Her speech is normal.  Nursing note  and vitals reviewed.    ED Treatments / Results  Labs (all labs ordered are listed, but only abnormal results are displayed) Labs Reviewed - No data to display  EKG  EKG Interpretation None       Radiology Dg Knee Complete 4 Views Left  Result Date: 10/05/2016 CLINICAL DATA:  Left knee pain and inability to bear weight for 2 days. No known injury. EXAM: LEFT KNEE - COMPLETE 4+ VIEW COMPARISON:  None. FINDINGS: No evidence of fracture, dislocation, or joint effusion. No evidence of arthropathy or other focal bone abnormality. Soft tissues are unremarkable. IMPRESSION: Negative. Electronically Signed   By: Earle Gell M.D.   On: 10/05/2016 18:39    Procedures Procedures (including critical care time) Knee immobilizer applied by nursing staff. After application, there is good cap refill. DP pulse 2+. Medications Ordered in ED Medications  HYDROcodone-acetaminophen (NORCO/VICODIN) 5-325 MG per  tablet 2 tablet (2 tablets Oral Given 10/05/16 1924)  ketorolac (TORADOL) tablet 10 mg (10 mg Oral Given 10/05/16 1924)  ondansetron (ZOFRAN) tablet 4 mg (4 mg Oral Given 10/05/16 1924)     Initial Impression / Assessment and Plan / ED Course  I have reviewed the triage vital signs and the nursing notes.  Pertinent labs & imaging results that were available during my care of the patient were reviewed by me and considered in my medical decision making (see chart for details).       Final Clinical Impressions(s) / ED Diagnoses MDM Vital signs stable. Xray of the knee is negative for fracture or effusion.  No gross neurovascular deficit.  No hx of trauma. Pt referred to orthopedics. Pt to receive knee immobilizer and crutches. Questions answered.   Final diagnoses:  Left anterior knee pain    New Prescriptions Discharge Medication List as of 10/05/2016  7:46 PM       Lily Kocher, PA-C 10/07/16 Kellogg, Greer, DO 10/09/16 [phone]

## 2016-10-05 NOTE — Progress Notes (Signed)
PT here for depo shot 150 mg IM given RT Deltoid. Tolerated well. Return 12 weeks for next shot. Pad CMA

## 2016-10-05 NOTE — ED Triage Notes (Signed)
Pt comes in with left knee pain starting 2 days ago. Denies any injury.

## 2016-12-07 ENCOUNTER — Ambulatory Visit (INDEPENDENT_AMBULATORY_CARE_PROVIDER_SITE_OTHER): Payer: Medicaid Other | Admitting: Family Medicine

## 2016-12-07 ENCOUNTER — Encounter: Payer: Self-pay | Admitting: Family Medicine

## 2016-12-07 ENCOUNTER — Other Ambulatory Visit: Payer: Self-pay | Admitting: Family Medicine

## 2016-12-07 VITALS — BP 146/86 | HR 80 | Temp 98.1°F | Resp 16 | Ht 65.0 in | Wt 223.1 lb

## 2016-12-07 DIAGNOSIS — M25562 Pain in left knee: Secondary | ICD-10-CM

## 2016-12-07 DIAGNOSIS — E663 Overweight: Secondary | ICD-10-CM

## 2016-12-07 DIAGNOSIS — F339 Major depressive disorder, recurrent, unspecified: Secondary | ICD-10-CM | POA: Insufficient documentation

## 2016-12-07 DIAGNOSIS — I10 Essential (primary) hypertension: Secondary | ICD-10-CM | POA: Diagnosis not present

## 2016-12-07 MED ORDER — LISINOPRIL-HYDROCHLOROTHIAZIDE 20-25 MG PO TABS: 1.0000 | ORAL_TABLET | Freq: Every day | ORAL | 3 refills | Status: DC

## 2016-12-07 MED ORDER — IBUPROFEN 600 MG PO TABS: 600.0000 mg | ORAL_TABLET | Freq: Four times a day (QID) | ORAL | 0 refills | Status: DC

## 2016-12-07 NOTE — Patient Instructions (Addendum)
Try to eat a heart healthy diet Stop the amlodipine Start the lisinopril daily Take the ibuprofen as needed knee pain Do the knee exercises  Need lab testing  See me in one month   Patellofemoral Pain Syndrome Patellofemoral pain syndrome is a condition that involves a softening or breakdown of the tissue (cartilage) on the underside of your kneecap (patella). This causes pain in the front of the knee. The condition is also called runner's knee or chondromalacia patella. Patellofemoral pain syndrome is most common in young adults who are active in sports. Your knee is the largest joint in your body. The patella covers the front of your knee and is attached to muscles above and below your knee. The underside of the patella is covered with a smooth type of cartilage (synovium). The smooth surface helps the patella glide easily when you move your knee. Patellofemoral pain syndrome causes swelling in the joint linings and bone surfaces in your knee. What are the causes? Patellofemoral pain syndrome can be caused by:  Overuse.  Poor alignment of your knee joints.  Weak leg muscles.  A direct blow to your kneecap.  What increases the risk? You may be at risk for patellofemoral pain syndrome if you:  Do a lot of activities that can wear down your kneecap. These include: ? Running. ? Squatting. ? Climbing stairs.  Start a new physical activity or exercise program.  Wear shoes that do not fit well.  Do not have good leg strength.  Are overweight.  What are the signs or symptoms? Knee pain is the most common symptom of patellofemoral pain syndrome. This may feel like a dull, aching pain underneath your patella, in the front of your knee. There may be a popping or cracking sound when you move your knee. Pain may get worse with:  Exercise.  Climbing stairs.  Running.  Jumping.  Squatting.  Kneeling.  Sitting for a long time.  Moving or pushing on your patella.  How is  this diagnosed? Your health care provider may be able to diagnose patellofemoral pain syndrome from your symptoms and medical history. You may be asked about your recent physical activities and which ones cause knee pain. Your health care provider may do a physical exam with certain tests to confirm the diagnosis. These may include:  Moving your patella back and forth.  Checking your range of knee motion.  Having you squat or jump to see if you have pain.  Checking the strength of your leg muscles.  An MRI of the knee may also be done. How is this treated? Patellofemoral pain syndrome can usually be treated at home with rest, ice, compression, and elevation (RICE). Other treatments may include:  Nonsteroidal anti-inflammatory drugs (NSAIDs).  Physical therapy to stretch and strengthen your leg muscles.  Shoe inserts (orthotics) to take stress off your knee.  A knee brace or knee support.  Surgery to remove damaged cartilage or move the patella to a better position. The need for surgery is rare.  Follow these instructions at home:  Take medicines only as directed by your health care provider.  Rest your knee. ? When resting, keep your knee raised above the level of your heart. ? Avoid activities that cause knee pain.  Apply ice to the injured area: ? Put ice in a plastic bag. ? Place a towel between your skin and the bag. ? Leave the ice on for 20 minutes, 2-3 times a day.  Use splints, braces, knee supports,  or walking aids as directed by your health care provider.  Perform stretching and strengthening exercises as directed by your health care provider or physical therapist.  Keep all follow-up visits as directed by your health care provider. This is important. Contact a health care provider if:  Your symptoms get worse.  You are not improving with home care. This information is not intended to replace advice given to you by your health care provider. Make sure you  discuss any questions you have with your health care provider. Document Released: 05/04/2009 Document Revised: 10/22/2015 Document Reviewed: 08/05/2013 Elsevier Interactive Patient Education  2018 [employer].

## 2016-12-07 NOTE — Telephone Encounter (Signed)
Patient is requesting megestrol 40mg  and a water pill. She states that she was getting these through her obgyn,  Please call in @ [place]

## 2016-12-07 NOTE — Progress Notes (Signed)
Chief Complaint  Patient presents with  . Hypertension  had 8th baby 6 months ago Still with some post partum depression Is on Lexapro and would like counseling. Is not working  Has knee pain.  Went to the ER.  Not getting better.  Anterior knee pain is described to patient and treatment including weight loss, exercise and NSAIDs She has hypertension.  Not well controlled.  Edema on the amlodipine.  Will try lisinopril HCTZ Recent chart and labs reviewed No other acute complaints   Patient Active Problem List   Diagnosis Date Noted  . Depression, recurrent ([place]) 12/07/2016  . Essential hypertension 07/20/2016  . Uterine fibroid 01/27/2016  . 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 [place] multiparity 11/26/2014    Outpatient Encounter Prescriptions as of 12/07/2016  Medication Sig  . escitalopram (LEXAPRO) 10 MG tablet Take 1 tablet (10 mg total) by mouth daily.  [name] ibuprofen (ADVIL,MOTRIN) 600 MG tablet Take 1 tablet (600 mg total) by mouth 4 (four) times daily.  . medroxyPROGESTERone (DEPO-PROVERA) 150 MG/ML injection Inject 1 mL (150 mg total) into the muscle every 3 (three) months.  . Prenatal Vit-Fe Fumarate-FA (PRENATAL VITAMIN PLUS LOW IRON) 27-1 MG TABS TAKE ONE TABLET BY MOUTH DAILY AT 12 NOON   No facility-administered encounter medications on file as of 12/07/2016.     Past Medical History:  Diagnosis Date  . AMA (advanced maternal age) multigravida 35+ 11/12/2014  . Anemia   . Depression   . Hypertension   . Migraine   . Pregnant 11/12/2014  . Round ligament pain 11/12/2014  . Short of breath on exertion 11/12/2014  . Trichomonas infection     Past Surgical History:  Procedure Laterality Date  . CHOLECYSTECTOMY      Social History   Social History  . Marital status: Significant Other    Spouse name: [name]  . Number of children: 8  . Years of education: 9   Occupational History  . unemployed    Social History Main  Topics  . Smoking status: Never Smoker  . Smokeless tobacco: Never Used  . Alcohol use No  . Drug use: No  . Sexual activity: Yes    Birth control/ protection: Injection   Other Topics Concern  . Not on file   Social History Narrative   Lives with 5 children   [name], his mother   Fiancee's two children    Family History  Problem Relation Age of Onset  . Diabetes Mother   . Hypertension Mother   . Kidney disease Mother   . Asthma Mother   . Hyperlipidemia Mother   . Alzheimer's disease Father   . Diabetes Father   . Kidney disease Sister   . Asthma Son   . Diabetes Sister   . Asthma Son   . Heart murmur Son   . Asthma Son     Review of Systems  Constitutional: Negative for chills, fever and weight loss.  HENT: Negative for congestion and hearing loss.   Eyes: Negative for blurred vision and pain.  Respiratory: Negative for cough and shortness of breath.   Cardiovascular: Negative for chest pain and leg swelling.  Gastrointestinal: Negative for abdominal pain, constipation, diarrhea and heartburn.  Genitourinary: Negative for dysuria and frequency.  Musculoskeletal: Positive for joint pain. Negative for falls and myalgias.  Neurological: Negative for dizziness, seizures and headaches.  Psychiatric/Behavioral: Positive for depression. The patient is not nervous/anxious and does not have insomnia.  BP (!) 146/86 (BP Location: Right Arm, Patient Position: Sitting, Cuff Size: Normal)   Pulse 80   Temp 98.1 F (36.7 C) (Temporal)   Resp 16   Ht 5\' 5"  (1.651 m)   Wt 223 lb 1.3 oz (101.2 kg)   SpO2 98%   BMI 37.12 kg/m   Physical Exam  Constitutional: She is oriented to person, place, and time. She appears well-developed and well-nourished.  HENT:  Head: Normocephalic and atraumatic.  Mouth/Throat: Oropharynx is clear and moist.  Eyes: Conjunctivae are normal. Pupils are equal, round, and reactive to light.  Neck: Normal range of motion. Neck supple. No  thyromegaly present.  Cardiovascular: Normal rate, regular rhythm and normal heart sounds.   Pulmonary/Chest: Effort normal and breath sounds normal. No respiratory distress.  Abdominal: Soft. Bowel sounds are normal.  Musculoskeletal: Normal range of motion. She exhibits no edema.  Left knee demonstrates patellofemoral crepitus and pain  Lymphadenopathy:    She has no cervical adenopathy.  Neurological: She is alert and oriented to person, place, and time.  Gait normal  Skin: Skin is warm and dry.  Psychiatric: She has a normal mood and affect. Her behavior is normal. Thought content normal.  Nursing note and vitals reviewed. ASSESSMENT/PLAN:   1. Depression, recurrent ([place])  - Ambulatory referral to Psychology  2. Anterior knee pain, left Ibuprofen Rx  3. Patient overweight  4. Essential hypertension  - CBC - COMPLETE METABOLIC PANEL WITH GFR - Hemoglobin A1c - Lipid panel - VITAMIN D 25 Hydroxy (Vit-D Deficiency, Fractures) - Urinalysis, Routine w reflex microscopic   Patient Instructions  Try to eat a heart healthy diet Stop the amlodipine Start the lisinopril daily Take the ibuprofen as needed knee pain Do the knee exercises  Need lab testing  See me in one month   Patellofemoral Pain Syndrome Patellofemoral pain syndrome is a condition that involves a softening or breakdown of the tissue (cartilage) on the underside of your kneecap (patella). This causes pain in the front of the knee. The condition is also called runner's knee or chondromalacia patella. Patellofemoral pain syndrome is most common in young adults who are active in sports. Your knee is the largest joint in your body. The patella covers the front of your knee and is attached to muscles above and below your knee. The underside of the patella is covered with a smooth type of cartilage (synovium). The smooth surface helps the patella glide easily when you move your knee. Patellofemoral pain syndrome  causes swelling in the joint linings and bone surfaces in your knee. What are the causes? Patellofemoral pain syndrome can be caused by:  Overuse.  Poor alignment of your knee joints.  Weak leg muscles.  A direct blow to your kneecap.  What increases the risk? You may be at risk for patellofemoral pain syndrome if you:  Do a lot of activities that can wear down your kneecap. These include: ? Running. ? Squatting. ? Climbing stairs.  Start a new physical activity or exercise program.  Wear shoes that do not fit well.  Do not have good leg strength.  Are overweight.  What are the signs or symptoms? Knee pain is the most common symptom of patellofemoral pain syndrome. This may feel like a dull, aching pain underneath your patella, in the front of your knee. There may be a popping or cracking sound when you move your knee. Pain may get worse with:  Exercise.  Climbing stairs.  Running.  Jumping.  Squatting.  Kneeling.  Sitting for a long time.  Moving or pushing on your patella.  How is this diagnosed? Your health care provider may be able to diagnose patellofemoral pain syndrome from your symptoms and medical history. You may be asked about your recent physical activities and which ones cause knee pain. Your health care provider may do a physical exam with certain tests to confirm the diagnosis. These may include:  Moving your patella back and forth.  Checking your range of knee motion.  Having you squat or jump to see if you have pain.  Checking the strength of your leg muscles.  An MRI of the knee may also be done. How is this treated? Patellofemoral pain syndrome can usually be treated at home with rest, ice, compression, and elevation (RICE). Other treatments may include:  Nonsteroidal anti-inflammatory drugs (NSAIDs).  Physical therapy to stretch and strengthen your leg muscles.  Shoe inserts (orthotics) to take stress off your knee.  A knee brace  or knee support.  Surgery to remove damaged cartilage or move the patella to a better position. The need for surgery is rare.  Follow these instructions at home:  Take medicines only as directed by your health care provider.  Rest your knee. ? When resting, keep your knee raised above the level of your heart. ? Avoid activities that cause knee pain.  Apply ice to the injured area: ? Put ice in a plastic bag. ? Place a towel between your skin and the bag. ? Leave the ice on for 20 minutes, 2-3 times a day.  Use splints, braces, knee supports, or walking aids as directed by your health care provider.  Perform stretching and strengthening exercises as directed by your health care provider or physical therapist.  Keep all follow-up visits as directed by your health care provider. This is important. Contact a health care provider if:  Your symptoms get worse.  You are not improving with home care. This information is not intended to replace advice given to you by your health care provider. Make sure you discuss any questions you have with your health care provider. Document Released: 05/04/2009 Document Revised: 10/22/2015 Document Reviewed: 08/05/2013 Elsevier Interactive Patient Education  2018 Elsevier Inc.    Raylene Everts, MD

## 2016-12-08 ENCOUNTER — Other Ambulatory Visit: Payer: Self-pay | Admitting: Adult Health

## 2016-12-13 ENCOUNTER — Encounter (HOSPITAL_COMMUNITY): Payer: Self-pay | Admitting: Emergency Medicine

## 2016-12-13 ENCOUNTER — Emergency Department (HOSPITAL_COMMUNITY)
Admission: EM | Admit: 2016-12-13 | Discharge: 2016-12-13 | Disposition: A | Discharge status: Home / Self Care | Payer: Medicaid Other | Attending: Emergency Medicine | Admitting: Emergency Medicine

## 2016-12-13 DIAGNOSIS — Z79899 Other long term (current) drug therapy: Secondary | ICD-10-CM | POA: Insufficient documentation

## 2016-12-13 DIAGNOSIS — I10 Essential (primary) hypertension: Secondary | ICD-10-CM | POA: Diagnosis not present

## 2016-12-13 DIAGNOSIS — K0889 Other specified disorders of teeth and supporting structures: Secondary | ICD-10-CM

## 2016-12-13 MED ORDER — DICLOFENAC SODIUM 75 MG PO TBEC: 75.0000 mg | DELAYED_RELEASE_TABLET | Freq: Two times a day (BID) | ORAL | 0 refills | Status: DC

## 2016-12-13 MED ORDER — PENICILLIN V POTASSIUM 500 MG PO TABS: 500.0000 mg | ORAL_TABLET | Freq: Four times a day (QID) | ORAL | 0 refills | Status: AC

## 2016-12-13 NOTE — ED Provider Notes (Signed)
[facility] DEPT Provider Note   CSN: [id number] Arrival date & time: 12/13/16  1718     History   Chief Complaint Chief Complaint  Patient presents with  . broken sharp tooth    HPI [name] is a 41 y.o. female.  HPI  Joyce [name] is a 41 y.o. female who presents to the Emergency Department complaining of right lower dental pain. She states that one of her right lower teeth has broken off while eating and she now has a sharp edge on the tooth that is irritating the side of her tongue.  She complains of pain to the tooth associated with chewing and constant pain to her tongue.  She denies fever, facial swelling, neck pain,  Difficulty swallowing.   Past Medical History:  Diagnosis Date  . AMA (advanced maternal age) multigravida 35+ 11/12/2014  . Anemia   . Depression   . Hypertension   . Migraine   . Pregnant 11/12/2014  . Round ligament pain 11/12/2014  . Short of breath on exertion 11/12/2014  . Trichomonas infection     Patient Active Problem List   Diagnosis Date Noted  . Depression, recurrent ([place]) 12/07/2016  . Essential hypertension 07/20/2016  . Uterine fibroid 01/27/2016  . 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 [place] multiparity 11/26/2014    Past Surgical History:  Procedure Laterality Date  . CHOLECYSTECTOMY      OB History    Gravida Para Term Preterm AB Living   11 8 8  0 3 8   SAB TAB Ectopic Multiple Live Births   1 2   0 8       Home Medications    Prior to Admission medications   Medication Sig Start Date End Date Taking? Authorizing Provider  escitalopram (LEXAPRO) 10 MG tablet Take 1 tablet (10 mg total) by mouth daily. 07/20/16   Estill Dooms, NP  hydrochlorothiazide (MICROZIDE) 12.5 MG capsule TAKE ONE CAPSULE BY MOUTH EVERY DAY 12/08/16   Derrek Monaco A, NP  ibuprofen (ADVIL,MOTRIN) 600 MG tablet Take 1 tablet (600 mg total) by mouth 4 (four) times daily. 12/07/16    Raylene Everts, MD  lisinopril-hydrochlorothiazide (PRINZIDE,ZESTORETIC) 20-25 MG tablet Take 1 tablet by mouth daily. 12/07/16   Raylene Everts, MD  medroxyPROGESTERone (DEPO-PROVERA) 150 MG/ML injection Inject 1 mL (150 mg total) into the muscle every 3 (three) months. 07/12/16   Christin Fudge, CNM  Prenatal Vit-Fe Fumarate-FA (PRENATAL VITAMIN PLUS LOW IRON) 27-1 MG TABS TAKE ONE TABLET BY MOUTH DAILY AT 12 NOON 12/24/15   Estill Dooms, NP    Family History Family History  Problem Relation Age of Onset  . Diabetes Mother   . Hypertension Mother   . Kidney disease Mother   . Asthma Mother   . Hyperlipidemia Mother   . Alzheimer's disease Father   . Diabetes Father   . Kidney disease Sister   . Asthma Son   . Diabetes Sister   . Asthma Son   . Heart murmur Son   . Asthma Son     Social History Social History  Substance Use Topics  . Smoking status: Never Smoker  . Smokeless tobacco: Never Used  . Alcohol use No     Allergies   Patient has no known allergies.   Review of Systems Review of Systems  Constitutional: Negative for appetite change and fever.  HENT: Positive for dental problem. Negative for congestion, facial swelling, sore throat  and trouble swallowing.   Eyes: Negative for pain and visual disturbance.  Musculoskeletal: Negative for neck pain and neck stiffness.  Neurological: Negative for dizziness, facial asymmetry and headaches.  Hematological: Negative for adenopathy.  All other systems reviewed and are negative.    Physical Exam Updated Vital Signs BP 113/66 (BP Location: Right Arm)   Pulse 84   Temp (!) 97.3 F (36.3 C) (Oral)   Resp 16   Ht 5\' 5"  (1.651 m)   Wt 101.2 kg (223 lb)   SpO2 100%   BMI 37.11 kg/m   Physical Exam  Constitutional: She is oriented to person, place, and time. She appears well-developed and well-nourished. No distress.  HENT:  Head: Normocephalic and atraumatic.  Right Ear: Tympanic  membrane and ear canal normal.  Left Ear: Tympanic membrane and ear canal normal.  Mouth/Throat: Uvula is midline, oropharynx is clear and moist and mucous membranes are normal. No trismus in the jaw. Dental caries present. No dental abscesses or uvula swelling.  Tenderness and dental caries of the right second molar.  Abrasion to right lateral tongue.  No facial swelling, obvious dental abscess, trismus, or sublingual abnml.    Neck: Normal range of motion. Neck supple.  Cardiovascular: Normal rate, regular rhythm and normal heart sounds.   No murmur heard. Pulmonary/Chest: Effort normal and breath sounds normal.  Musculoskeletal: Normal range of motion.  Lymphadenopathy:    She has no cervical adenopathy.  Neurological: She is alert and oriented to person, place, and time. She exhibits normal muscle tone. Coordination normal.  Skin: Skin is warm and dry.  Nursing note and vitals reviewed.    ED Treatments / Results  Labs (all labs ordered are listed, but only abnormal results are displayed) Labs Reviewed - No data to display  EKG  EKG Interpretation None       Radiology No results found.  Procedures Procedures (including critical care time)  Medications Ordered in ED Medications - No data to display   Initial Impression / Assessment and Plan / ED Course  I have reviewed the triage vital signs and the nursing notes.  Pertinent labs & imaging results that were available during my care of the patient were reviewed by me and considered in my medical decision making (see chart for details).     Pt is well appearing.  Airway patent.  No concerning sx's for periapical abscess and Ludwig's angina. Referral info given for dentistry.    Final Clinical Impressions(s) / ED Diagnoses   Final diagnoses:  Pain, dental    New Prescriptions New Prescriptions   No medications on file     Bufford Lope 12/16/16 2309    Daleen Bo, MD 12/17/16 [phone]

## 2016-12-13 NOTE — ED Triage Notes (Signed)
Pt broken right lower tooth, states the sharp edge is hurting her tongue

## 2016-12-13 NOTE — Discharge Instructions (Signed)
Call one of the dentists on the list provided to arrange a follow-up appt.   °

## 2016-12-15 ENCOUNTER — Other Ambulatory Visit: Payer: Self-pay | Admitting: Family Medicine

## 2016-12-28 ENCOUNTER — Ambulatory Visit (INDEPENDENT_AMBULATORY_CARE_PROVIDER_SITE_OTHER): Payer: Medicaid Other | Admitting: *Deleted

## 2016-12-28 ENCOUNTER — Encounter: Payer: Self-pay | Admitting: *Deleted

## 2016-12-28 DIAGNOSIS — Z3042 Encounter for surveillance of injectable contraceptive: Secondary | ICD-10-CM

## 2016-12-28 DIAGNOSIS — Z3202 Encounter for pregnancy test, result negative: Secondary | ICD-10-CM

## 2016-12-28 LAB — POCT URINE PREGNANCY: PREG TEST UR: NEGATIVE

## 2016-12-28 MED ORDER — MEDROXYPROGESTERONE ACETATE 150 MG/ML IM SUSP
150.0000 mg | Freq: Once | INTRAMUSCULAR | Status: AC
  Administered 2016-12-28: 150 mg via INTRAMUSCULAR

## 2016-12-28 NOTE — Progress Notes (Signed)
Pt given DepoProvera 150mg IM left deltoid without complications. Advised pt to return in 12 weeks for next injection.  

## 2017-01-09 ENCOUNTER — Ambulatory Visit: Payer: Medicaid Other | Admitting: Family Medicine

## 2017-01-25 ENCOUNTER — Ambulatory Visit (HOSPITAL_COMMUNITY): Payer: Medicaid Other | Admitting: Psychiatry

## 2017-02-10 ENCOUNTER — Ambulatory Visit: Payer: Self-pay | Admitting: Family Medicine

## 2017-02-15 ENCOUNTER — Ambulatory Visit (INDEPENDENT_AMBULATORY_CARE_PROVIDER_SITE_OTHER): Payer: Medicaid Other | Admitting: Family Medicine

## 2017-02-15 ENCOUNTER — Encounter: Payer: Self-pay | Admitting: Family Medicine

## 2017-02-15 VITALS — BP 146/84 | HR 84 | Temp 97.6°F | Resp 16 | Ht 65.0 in | Wt 226.0 lb

## 2017-02-15 DIAGNOSIS — I10 Essential (primary) hypertension: Secondary | ICD-10-CM

## 2017-02-15 DIAGNOSIS — Z23 Encounter for immunization: Secondary | ICD-10-CM

## 2017-02-15 DIAGNOSIS — G43709 Chronic migraine without aura, not intractable, without status migrainosus: Secondary | ICD-10-CM

## 2017-02-15 MED ORDER — SUMATRIPTAN SUCCINATE 50 MG PO TABS: 50.0000 mg | ORAL_TABLET | Freq: Once | ORAL | 10 refills | Status: DC

## 2017-02-15 MED ORDER — LISINOPRIL-HYDROCHLOROTHIAZIDE 10-12.5 MG PO TABS: 1.0000 | ORAL_TABLET | Freq: Every day | ORAL | 3 refills | Status: DC

## 2017-02-15 NOTE — Progress Notes (Signed)
Chief Complaint  Patient presents with  . Follow-up    1 month   Doing well on lexapro.  Still seeing counselor. Out of BP medicine.  Will refill lisinopril/HCTZ Has history migraines.  Used to take Imitrex. Would like refill.   Due for lab testing otherwise is well.  has gained a couple of pounds - discussed need to change lifestyle, walk daily  Patient Active Problem List   Diagnosis Date Noted  . Depression, recurrent ([place]) 12/07/2016  . Essential hypertension 07/20/2016  . Uterine fibroid 01/27/2016  . 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 [place] multiparity 11/26/2014    Outpatient Encounter Prescriptions as of 02/15/2017  Medication Sig  . escitalopram (LEXAPRO) 10 MG tablet Take 1 tablet (10 mg total) by mouth daily.  [name] ibuprofen (ADVIL,MOTRIN) 600 MG tablet Take 1 tablet (600 mg total) by mouth 4 (four) times daily.  . medroxyPROGESTERone (DEPO-PROVERA) 150 MG/ML injection Inject 1 mL (150 mg total) into the muscle every 3 (three) months.  . Prenatal Vit-Fe Fumarate-FA (PRENATAL VITAMIN PLUS LOW IRON) 27-1 MG TABS TAKE ONE TABLET BY MOUTH DAILY AT 12 NOON  . lisinopril-hydrochlorothiazide (PRINZIDE,ZESTORETIC) 10-12.5 MG tablet Take 1 tablet by mouth daily.  . SUMAtriptan (IMITREX) 50 MG tablet Take 1 tablet (50 mg total) by mouth once. May repeat in 2 hours if headache persists or recurs.   No facility-administered encounter medications on file as of 02/15/2017.     No Known Allergies  Review of Systems  Constitutional: Negative for activity change, appetite change and unexpected weight change.  HENT: Negative for congestion, dental problem, postnasal drip and rhinorrhea.   Eyes: Negative for redness and visual disturbance.  Respiratory: Negative for cough and shortness of breath.   Cardiovascular: Negative for chest pain, palpitations and leg swelling.  Gastrointestinal: Negative for abdominal pain, constipation and diarrhea.    Genitourinary: Negative for difficulty urinating, frequency and menstrual problem.  Musculoskeletal: Negative for arthralgias and back pain.  Neurological: Positive for headaches. Negative for dizziness.       Sound and light sensitive with HA, no nausea or vision changes  Psychiatric/Behavioral: Negative for dysphoric mood and sleep disturbance. The patient is not nervous/anxious.     BP (!) 146/84 (BP Location: Right Arm, Patient Position: Sitting, Cuff Size: Normal)   Pulse 84   Temp 97.6 F (36.4 C) (Temporal)   Resp 16   Ht 5\' 5"  (1.651 m)   Wt 226 lb 0.6 oz (102.5 kg)   SpO2 99%   BMI 37.62 kg/m   Physical Exam  Constitutional: She is oriented to person, place, and time. She appears well-developed and well-nourished.  HENT:  Head: Normocephalic and atraumatic.  Mouth/Throat: Oropharynx is clear and moist.  Eyes: Pupils are equal, round, and reactive to light. Conjunctivae are normal.  Neck: Normal range of motion. Neck supple. No thyromegaly present.  Cardiovascular: Normal rate, regular rhythm and normal heart sounds.   Pulmonary/Chest: Effort normal and breath sounds normal. No respiratory distress.  Abdominal: Soft. Bowel sounds are normal.  Musculoskeletal: Normal range of motion. She exhibits no edema.  Lymphadenopathy:    She has no cervical adenopathy.  Neurological: She is alert and oriented to person, place, and time.  Gait normal  Skin: Skin is warm and dry.  Psychiatric: She has a normal mood and affect. Her behavior is normal. Thought content normal.  Pleasant, cooperative  Nursing note and vitals reviewed.   ASSESSMENT/PLAN:  1. Need for influenza  vaccination - Flu Vaccine QUAD 36+ mos IM  2. Chronic migraine without aura without status migrainosus, not intractable Refill Imitrex  3. Essential hypertension Refill Lisinopril 10/HCTZ 12.5.  Her chart indicates higher dose, but her BP isn't that high today.  She agrees to come back for a BP check next  week   Patient Instructions  See me in 3 months Take blood pressure medicine every morning Take the Imitrix if needed migraine Call for problems See me in 6 months   Raylene Everts, MD

## 2017-02-15 NOTE — Patient Instructions (Signed)
See me in 3 months Take blood pressure medicine every morning Take the Imitrix if needed migraine Call for problems See me in 6 months

## 2017-02-16 LAB — URINALYSIS, ROUTINE W REFLEX MICROSCOPIC
BACTERIA UA: NONE SEEN /HPF
Bilirubin Urine: NEGATIVE
Glucose, UA: NEGATIVE
Hyaline Cast: NONE SEEN /LPF
Ketones, ur: NEGATIVE
Leukocytes, UA: NEGATIVE
Nitrite: NEGATIVE
PH: 6.5 (ref 5.0–8.0)
Protein, ur: NEGATIVE
RBC / HPF: NONE SEEN /HPF (ref 0–2)
Specific Gravity, Urine: 1.01 (ref 1.001–1.03)

## 2017-02-16 LAB — LIPID PANEL
Cholesterol: 136 mg/dL (ref <200)
HDL: 35 mg/dL — AB (ref >50)
LDL Cholesterol (Calc): 85 mg/dL (calc)
Non-HDL Cholesterol (Calc): 101 mg/dL (calc) (ref <130)
TRIGLYCERIDES: 74 mg/dL (ref <150)
Total CHOL/HDL Ratio: 3.9 (calc) (ref <5.0)

## 2017-02-16 LAB — CBC
HCT: 41.4 % (ref 35.0–45.0)
Hemoglobin: 13.8 g/dL (ref 11.7–15.5)
MCH: 26.4 pg — AB (ref 27.0–33.0)
MCHC: 33.3 g/dL (ref 32.0–36.0)
MCV: 79.2 fL — AB (ref 80.0–100.0)
MPV: 10.6 fL (ref 7.5–12.5)
PLATELETS: 307 10*3/uL (ref 140–400)
RBC: 5.23 10*6/uL — ABNORMAL HIGH (ref 3.80–5.10)
RDW: 13.6 % (ref 11.0–15.0)
WBC: 8.8 10*3/uL (ref 3.8–10.8)

## 2017-02-16 LAB — COMPLETE METABOLIC PANEL WITH GFR
AG RATIO: 1.3 (calc) (ref 1.0–2.5)
ALT: 14 U/L (ref 6–29)
AST: 18 U/L (ref 10–30)
Albumin: 4 g/dL (ref 3.6–5.1)
Alkaline phosphatase (APISO): 52 U/L (ref 33–115)
BILIRUBIN TOTAL: 0.7 mg/dL (ref 0.2–1.2)
BUN/Creatinine Ratio: 6 (calc) (ref 6–22)
BUN: 5 mg/dL — ABNORMAL LOW (ref 7–25)
CO2: 27 mmol/L (ref 20–32)
Calcium: 9.5 mg/dL (ref 8.6–10.2)
Chloride: 104 mmol/L (ref 98–110)
Creat: 0.8 mg/dL (ref 0.50–1.10)
GFR, EST AFRICAN AMERICAN: 107 mL/min/{1.73_m2} (ref >=60)
GFR, Est Non African American: 92 mL/min/{1.73_m2} (ref >=60)
Globulin: 3.2 g/dL (calc) (ref 1.9–3.7)
Glucose, Bld: 75 mg/dL (ref 65–139)
POTASSIUM: 4.2 mmol/L (ref 3.5–5.3)
Sodium: 139 mmol/L (ref 135–146)
Total Protein: 7.2 g/dL (ref 6.1–8.1)

## 2017-02-16 LAB — HEMOGLOBIN A1C
Hgb A1c MFr Bld: 5.3 % of total Hgb (ref <5.7)
MEAN PLASMA GLUCOSE: 105 (calc)
eAG (mmol/L): 5.8 (calc)

## 2017-02-16 LAB — VITAMIN D 25 HYDROXY (VIT D DEFICIENCY, FRACTURES): Vit D, 25-Hydroxy: 33 ng/mL (ref 30–100)

## 2017-02-27 ENCOUNTER — Ambulatory Visit (HOSPITAL_COMMUNITY): Payer: Self-pay | Admitting: Psychiatry

## 2017-03-15 ENCOUNTER — Ambulatory Visit (INDEPENDENT_AMBULATORY_CARE_PROVIDER_SITE_OTHER): Payer: Medicaid Other | Admitting: Psychiatry

## 2017-03-15 ENCOUNTER — Encounter (HOSPITAL_COMMUNITY): Payer: Self-pay | Admitting: Psychiatry

## 2017-03-15 DIAGNOSIS — F331 Major depressive disorder, recurrent, moderate: Secondary | ICD-10-CM | POA: Diagnosis not present
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
## 2017-03-22 ENCOUNTER — Ambulatory Visit (INDEPENDENT_AMBULATORY_CARE_PROVIDER_SITE_OTHER): Payer: Medicaid Other | Admitting: *Deleted

## 2017-03-22 ENCOUNTER — Encounter: Payer: Self-pay | Admitting: *Deleted

## 2017-03-22 DIAGNOSIS — Z3202 Encounter for pregnancy test, result negative: Secondary | ICD-10-CM | POA: Diagnosis not present

## 2017-03-22 DIAGNOSIS — Z3042 Encounter for surveillance of injectable contraceptive: Secondary | ICD-10-CM | POA: Diagnosis not present

## 2017-03-22 LAB — POCT URINE PREGNANCY: PREG TEST UR: NEGATIVE

## 2017-03-22 MED ORDER — MEDROXYPROGESTERONE ACETATE 150 MG/ML IM SUSP
150.0000 mg | Freq: Once | INTRAMUSCULAR | Status: AC
  Administered 2017-03-22: 150 mg via INTRAMUSCULAR

## 2017-03-22 NOTE — Progress Notes (Signed)
Pt given DepoProvera 150mg  IM right deltoid without complications. Advised pt to return in 12 weeks for next injection.

## 2017-03-27 NOTE — Progress Notes (Deleted)
Psychiatric Initial Adult Assessment   Patient Identification: Joyce [name] MRN:  [id number] Date of Evaluation:  03/27/2017 Referral Source: *** Chief Complaint:   Visit Diagnosis: No diagnosis found.  History of Present Illness:   Joyce [name] is a 41 year old female with depression, migraine, who is referred for depression.    Associated Signs/Symptoms: Depression Symptoms:  {DEPRESSION SYMPTOMS:20000} (Hypo) Manic Symptoms:  {BHH MANIC SYMPTOMS:22872} Anxiety Symptoms:  {BHH ANXIETY SYMPTOMS:22873} Psychotic Symptoms:  {BHH PSYCHOTIC SYMPTOMS:22874} PTSD Symptoms: {BHH PTSD SYMPTOMS:22875}  Past Psychiatric History:  Outpatient:  Psychiatry admission:  Previous suicide attempt:  Past trials of medication:  History of violence:   Previous Psychotropic Medications: {YES/NO:21197}  Substance Abuse History in the last 12 months:  {yes no:314532}  Consequences of Substance Abuse: {BHH CONSEQUENCES OF SUBSTANCE ABUSE:22880}  Past Medical History:  Past Medical History:  Diagnosis Date  . AMA (advanced maternal age) multigravida 35+ 11/12/2014  . Anemia   . Depression   . Hypertension   . Migraine   . Pregnant 11/12/2014  . Round ligament pain 11/12/2014  . Short of breath on exertion 11/12/2014  . Trichomonas infection     Past Surgical History:  Procedure Laterality Date  . CHOLECYSTECTOMY      Family Psychiatric History: ***  Family History:  Family History  Problem Relation Age of Onset  . Diabetes Mother   . Hypertension Mother   . Kidney disease Mother   . Asthma Mother   . Hyperlipidemia Mother   . Alzheimer's disease Father   . Diabetes Father   . Kidney disease Sister   . Asthma Son   . Diabetes Sister   . Asthma Son   . ADD / ADHD Son   . Heart murmur Son   . Asthma Son     Social History:   Social History   Social History  . Marital status: Significant Other    Spouse name: [name]  . Number of children: 8  . Years of  education: 9   Occupational History  . unemployed    Social History Main Topics  . Smoking status: Never Smoker  . Smokeless tobacco: Never Used  . Alcohol use No  . Drug use: No  . Sexual activity: Yes    Birth control/ protection: Injection   Other Topics Concern  . Not on file   Social History Narrative   Lives with 5 children   [name], his mother   Fiancee's two children    Additional Social History: ***  Allergies:  No Known Allergies  Metabolic Disorder Labs: Lab Results  Component Value Date   HGBA1C 5.3 02/15/2017   MPG 105 02/15/2017   No results found for: PROLACTIN Lab Results  Component Value Date   CHOL 136 02/15/2017   TRIG 74 02/15/2017   HDL 35 (L) 02/15/2017   CHOLHDL 3.9 02/15/2017     Current Medications: Current Outpatient Prescriptions  Medication Sig Dispense Refill  . escitalopram (LEXAPRO) 10 MG tablet Take 1 tablet (10 mg total) by mouth daily. 30 tablet 6  . ibuprofen (ADVIL,MOTRIN) 600 MG tablet Take 1 tablet (600 mg total) by mouth 4 (four) times daily. 30 tablet 0  . lisinopril-hydrochlorothiazide (PRINZIDE,ZESTORETIC) 10-12.5 MG tablet Take 1 tablet by mouth daily. 90 tablet 3  . medroxyPROGESTERone (DEPO-PROVERA) 150 MG/ML injection Inject 1 mL (150 mg total) into the muscle every 3 (three) months. 1 mL 3  . Prenatal Vit-Fe Fumarate-FA (PRENATAL VITAMIN PLUS LOW IRON) 27-1 MG TABS TAKE  ONE TABLET BY MOUTH DAILY AT 12 NOON 267 tablet 2  . SUMAtriptan (IMITREX) 50 MG tablet Take 1 tablet (50 mg total) by mouth once. May repeat in 2 hours if headache persists or recurs. 20 tablet 10   No current facility-administered medications for this visit.     Neurologic: Headache: No Seizure: No Paresthesias:No  Musculoskeletal: Strength & Muscle Tone: within normal limits Gait & Station: normal Patient leans: N/A  Psychiatric Specialty Exam: ROS  not currently breastfeeding.There is no height or weight on file to calculate BMI.   General Appearance: Fairly Groomed  Eye Contact:  Good  Speech:  Clear and Coherent  Volume:  Normal  Mood:  {BHH MOOD:22306}  Affect:  {Affect (PAA):22687}  Thought Process:  Coherent and Goal Directed  Orientation:  Full (Time, Place, and Person)  Thought Content:  Logical  Suicidal Thoughts:  {ST/HT (PAA):22692}  Homicidal Thoughts:  {ST/HT (PAA):22692}  Memory:  Immediate;   Good Recent;   Good Remote;   Good  Judgement:  {Judgement (PAA):22694}  Insight:  {Insight (PAA):22695}  Psychomotor Activity:  Normal  Concentration:  Concentration: Good and Attention Span: Good  Recall:  Good  Fund of Knowledge:Good  Language: Good  Akathisia:  No  Handed:  Right  AIMS (if indicated):  N/A  Assets:  Communication Skills Desire for Improvement  ADL's:  Intact  Cognition: WNL  Sleep:  ***   Assessment  Plan  The patient demonstrates the following risk factors for suicide: Chronic risk factors for suicide include: {Chronic Risk Factors for EYCXKGY:18563149}. Acute risk factors for suicide include: {Acute Risk Factors for FWYOVZC:58850277}. Protective factors for this patient include: {Protective Factors for Suicide AJOI:78676720}. Considering these factors, the overall suicide risk at this point appears to be {Desc; low/moderate/high:110033}. Patient {ACTION; IS/IS NOB:09628366} appropriate for outpatient follow up.   Treatment Plan Summary: Plan as above   Norman Clay, MD 10/29/20181:08 PM

## 2017-03-29 ENCOUNTER — Ambulatory Visit (HOSPITAL_COMMUNITY): Payer: Medicaid Other | Admitting: Psychiatry

## 2017-03-30 ENCOUNTER — Ambulatory Visit (HOSPITAL_COMMUNITY): Payer: Medicaid Other | Admitting: Psychiatry

## 2017-04-04 ENCOUNTER — Other Ambulatory Visit: Payer: Self-pay | Admitting: Adult Health

## 2017-04-12 ENCOUNTER — Ambulatory Visit (INDEPENDENT_AMBULATORY_CARE_PROVIDER_SITE_OTHER): Payer: Medicaid Other | Admitting: Psychiatry

## 2017-04-12 ENCOUNTER — Encounter (HOSPITAL_COMMUNITY): Payer: Self-pay | Admitting: Psychiatry

## 2017-04-12 DIAGNOSIS — F331 Major depressive disorder, recurrent, moderate: Secondary | ICD-10-CM

## 2017-04-12 NOTE — Progress Notes (Signed)
   THERAPIST PROGRESS NOTE  Session Time: Wednesday 04/12/2017 4:12 PM - 5:00 PM  Participation Level: Active  Behavioral Response: CasualAlertAnxious and Depressed  Type of Therapy: Individual Therapy  Treatment Goals addressed: Establish rapport, learn and implement calming techniques to manage stress and anxiety  Interventions: CBT and Supportive  Summary: Joyce [name] is a 41 y.o. female who is  referred for services by PCP Dr. Meda Coffee due to experiencing symptoms of depression and anxiety.  Patient reports beginning to experience symptoms  of depression and anxiety when a young child but never receiving any treatment. She reports sleep difficulty and states being up all night due to worry about a variety of issues. She reports stress related to problems in the relationship with her fiance as he does not help out with the bills like he is supposed to per her report. She also reports conflict related to his ex-girlfriends calling him. She reports taking care of six children and 2 grandchildren as well as fiancee's two children. She also reports having to deal with fiance's mother who also resides with the couple. Patient states feeling down, not wanting to interact with her children, not wanting to do anything, and having anxiety attacks. She reports no previous psychiatric hospitalizations or participation in outpatient therapy. She was prescribed lexapro by PCP about 6 months ago but says she takes it every blue moon.  Patient reports feeling a little better since last session as she has obtained a job, is getting out of the house, and is less worried about financial issues. She reports continued stress regarding relationship with fiance as they continue to have conflict. His 3 year old daughter recently moved in and is oppositional and defiant per patient's report. She expresses frustration as fiance becomes upset and defensive when patient tries to discuss issues with him and he  also allows daughter to do things patient has told her not to do. She also continues to express frustration with fiance as he still does not help out financially on a consistent basis and does not help with other household responsibilities. Patient reports being in a little less depressed but still experiencing anxiety, excessive worry, sleep difficulty, tearfulness, and muscle tension. She had an appointment to see psychiatrist Dr. Modesta Messing for medication evaluation but rescheduled appointment due to conflict with work schedule. Her appointment is 05/01/2017.  Suicidal/Homicidal: Nowithout intent/plan  Therapist Response: Established rapport, reviewed symptoms, discussed stressors, facilitated expression of thoughts and feelings, validated feelings, examined patient's interactions and expectations regarding her relationship with her fianc, assisted patient identify realistic expectations of the relationship, assisted patient identify areas she can change, assisted patient identify the way her body experiences anxiety, provided psychoeducation regarding anxiety and the stress response, discussed ways to trigger a relaxation response, discuss rationale for and practice deep breathing, assigned patient to practice 5-10 minutes 2 times per day, discussed patient spirituality and ways to use spirituality to develop coping statements.   Plan: Return again in 2 weeks.  Diagnosis: Axis I: Major Depressive Disorder, Recurrent, Moderate    Axis II: No diagnosis    [name], LCSW 04/12/2017

## 2017-04-25 ENCOUNTER — Telehealth (HOSPITAL_COMMUNITY): Payer: Self-pay | Admitting: *Deleted
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
## 2017-04-25 NOTE — Telephone Encounter (Signed)
left voice message, provider out of office 04/26/17. 

## 2017-04-26 ENCOUNTER — Ambulatory Visit (HOSPITAL_COMMUNITY): Payer: Self-pay | Admitting: Psychiatry

## 2017-05-01 ENCOUNTER — Ambulatory Visit (INDEPENDENT_AMBULATORY_CARE_PROVIDER_SITE_OTHER): Payer: Medicaid Other | Admitting: Psychiatry

## 2017-05-01 ENCOUNTER — Encounter (HOSPITAL_COMMUNITY): Payer: Self-pay | Admitting: Psychiatry

## 2017-05-01 ENCOUNTER — Telehealth (HOSPITAL_COMMUNITY): Payer: Self-pay | Admitting: Psychiatry

## 2017-05-01 VITALS — BP 138/84 | HR 78 | Ht 65.0 in | Wt 226.0 lb

## 2017-05-01 DIAGNOSIS — F419 Anxiety disorder, unspecified: Secondary | ICD-10-CM | POA: Diagnosis not present

## 2017-05-01 DIAGNOSIS — Z62811 Personal history of psychological abuse in childhood: Secondary | ICD-10-CM

## 2017-05-01 DIAGNOSIS — R454 Irritability and anger: Secondary | ICD-10-CM | POA: Diagnosis not present

## 2017-05-01 DIAGNOSIS — F410 Panic disorder [episodic paroxysmal anxiety] without agoraphobia: Secondary | ICD-10-CM | POA: Diagnosis not present

## 2017-05-01 DIAGNOSIS — F4310 Post-traumatic stress disorder, unspecified: Secondary | ICD-10-CM

## 2017-05-01 DIAGNOSIS — Z560 Unemployment, unspecified: Secondary | ICD-10-CM

## 2017-05-01 DIAGNOSIS — G4700 Insomnia, unspecified: Secondary | ICD-10-CM | POA: Diagnosis not present

## 2017-05-01 DIAGNOSIS — F4323 Adjustment disorder with mixed anxiety and depressed mood: Secondary | ICD-10-CM | POA: Insufficient documentation

## 2017-05-01 DIAGNOSIS — F331 Major depressive disorder, recurrent, moderate: Secondary | ICD-10-CM | POA: Diagnosis not present

## 2017-05-01 DIAGNOSIS — Z810 Family history of intellectual disabilities: Secondary | ICD-10-CM | POA: Diagnosis not present

## 2017-05-01 DIAGNOSIS — R450 Nervousness: Secondary | ICD-10-CM

## 2017-05-01 DIAGNOSIS — F515 Nightmare disorder: Secondary | ICD-10-CM | POA: Diagnosis not present

## 2017-05-01 HISTORY — DX: Post-traumatic stress disorder, unspecified: F43.10

## 2017-05-01 MED ORDER — ESCITALOPRAM OXALATE 20 MG PO TABS: 20.0000 mg | ORAL_TABLET | Freq: Every day | ORAL | 0 refills | Status: DC

## 2017-05-01 MED ORDER — TRAZODONE HCL 50 MG PO TABS: ORAL_TABLET | ORAL | 0 refills | Status: DC

## 2017-05-01 NOTE — Addendum Note (Signed)
Addended by: Norman Clay on: 05/01/2017 04:43 PM   Modules accepted: Orders

## 2017-05-01 NOTE — Patient Instructions (Signed)
1. Increase lexapro 20 mg daily  2. Start Trazodone 25-50 mg at night as needed for sleep 3. Referral to IOP (intensive outpatient program) 4. Obtain blood test (TSH) 5. Return to clinic in one month for 30 mins

## 2017-05-01 NOTE — Telephone Encounter (Signed)
D:  Pt referred per Dr. Modesta Messing to [facility].  A:  Placed call to pt to orient her.  Pt states she would like to call her insurance company first to find out about her benefits.  Encouraged pt to call writer whenever she's ready to start.  R:  Pt receptive.

## 2017-05-11 ENCOUNTER — Ambulatory Visit (INDEPENDENT_AMBULATORY_CARE_PROVIDER_SITE_OTHER): Payer: Medicaid Other | Admitting: Psychiatry

## 2017-05-11 DIAGNOSIS — F331 Major depressive disorder, recurrent, moderate: Secondary | ICD-10-CM | POA: Diagnosis not present

## 2017-05-11 DIAGNOSIS — F4310 Post-traumatic stress disorder, unspecified: Secondary | ICD-10-CM | POA: Diagnosis not present

## 2017-05-11 NOTE — Progress Notes (Signed)
   THERAPIST PROGRESS NOTE  Session Time:  Thursday 05/12/2107 4:20 PM - 5:00 PM    Participation Level: Active  Behavioral Response: CasualAlertAnxious and Depressed/tearful  Type of Therapy: Individual Therapy  Treatment Goals addressed:  Learn and implement calming techniques to manage stress and anxiety  Interventions: CBT and Supportive  Summary: Joyce [name] is a 41 y.o. female who is  referred for services by PCP Dr. Meda Coffee due to experiencing symptoms of depression and anxiety.  Patient reports beginning to experience symptoms  of depression and anxiety when a young child but never receiving any treatment. She reports sleep difficulty and states being up all night due to worry about a variety of issues. She reports stress related to problems in the relationship with her fiance as he does not help out with the bills like he is supposed to per her report. She also reports conflict related to his ex-girlfriends calling him. She reports taking care of six children and 2 grandchildren as well as fiancee's two children. She also reports having to deal with fiance's mother who also resides with the couple. Patient states feeling down, not wanting to interact with her children, not wanting to do anything, and having anxiety attacks. She reports no previous psychiatric hospitalizations or participation in outpatient therapy. She was prescribed lexapro by PCP about 6 months ago but says she takes it every blue moon.  Patient reports she has begun taking increased dosage of Lexapro as prescribed by psychiatrist Dr. Modesta Messing. She says her medication seems to be helping a little but  reports continued stress regarding the relationship with her boyfriend. He remains in contact with ex-girlfriends and still fails to contribute to the household in a responsible manner. She expresses frustration and anger. Per her report, she and boyfriend had an argument earlier this week. She says she has given him a  deadline of the end of January or the  first of February to move out of her home. However, she expresses worry about how a move may affect his 2 youngest children whom she has raised for the past 2 years. She has set boundaries with his 45 year old daughter and is pleased with positive response from the daughter. Patient continues to work and says this has been helpful. She also has been practicing the deep breathing and says it has been helpful as well.   Suicidal/Homicidal: Nowithout intent/plan  Therapist Response:  reviewed symptoms, discussed stressors, facilitated expression of thoughts and feelings, validated feelings, reviewed realistic expectations of the relationship with boyfriend, praised and reinforced patient's use of deep breathing, assisted patient identify other coping and relaxation techniques including physical exercise such as walking and scheduling time for self,  assisted patient identify, challenge, and replace thoughts that promote inappropriate guilt and excessive worry   Plan: Return again in 2 weeks.  Diagnosis: Axis I: Major Depressive Disorder, Recurrent, Moderate    Axis II: No diagnosis    Briannah Lona, LCSW 05/11/2017

## 2017-05-25 ENCOUNTER — Telehealth (HOSPITAL_COMMUNITY): Payer: Self-pay | Admitting: *Deleted

## 2017-05-25 ENCOUNTER — Ambulatory Visit (HOSPITAL_COMMUNITY): Payer: Medicaid Other | Admitting: Psychiatry

## 2017-05-25 NOTE — Telephone Encounter (Signed)
left voice message, provider out of office 05/25/17

## 2017-05-31 ENCOUNTER — Encounter (HOSPITAL_COMMUNITY): Payer: Self-pay | Admitting: Emergency Medicine

## 2017-05-31 ENCOUNTER — Other Ambulatory Visit: Payer: Self-pay

## 2017-05-31 ENCOUNTER — Emergency Department (HOSPITAL_COMMUNITY): Payer: Medicaid Other

## 2017-05-31 ENCOUNTER — Emergency Department (HOSPITAL_COMMUNITY)
Admission: EM | Admit: 2017-05-31 | Discharge: 2017-06-01 | Disposition: A | Discharge status: Home / Self Care | Payer: Medicaid Other | Attending: Emergency Medicine | Admitting: Emergency Medicine

## 2017-05-31 DIAGNOSIS — I10 Essential (primary) hypertension: Secondary | ICD-10-CM | POA: Insufficient documentation

## 2017-05-31 DIAGNOSIS — K529 Noninfective gastroenteritis and colitis, unspecified: Secondary | ICD-10-CM

## 2017-05-31 DIAGNOSIS — Z79899 Other long term (current) drug therapy: Secondary | ICD-10-CM | POA: Diagnosis not present

## 2017-05-31 DIAGNOSIS — R1030 Lower abdominal pain, unspecified: Secondary | ICD-10-CM | POA: Diagnosis present

## 2017-05-31 LAB — COMPREHENSIVE METABOLIC PANEL
ALK PHOS: 47 U/L (ref 38–126)
ALT: 28 U/L (ref 14–54)
ANION GAP: 9 (ref 5–15)
AST: 24 U/L (ref 15–41)
Albumin: 3.8 g/dL (ref 3.5–5.0)
BUN: 9 mg/dL (ref 6–20)
CALCIUM: 9.2 mg/dL (ref 8.9–10.3)
CHLORIDE: 107 mmol/L (ref 101–111)
CO2: 25 mmol/L (ref 22–32)
Creatinine, Ser: 0.76 mg/dL (ref 0.44–1.00)
GFR calc non Af Amer: >60 mL/min (ref >60)
Glucose, Bld: 92 mg/dL (ref 65–99)
POTASSIUM: 3.5 mmol/L (ref 3.5–5.1)
SODIUM: 141 mmol/L (ref 135–145)
Total Bilirubin: 0.5 mg/dL (ref 0.3–1.2)
Total Protein: 7.9 g/dL (ref 6.5–8.1)

## 2017-05-31 LAB — CBC
HEMATOCRIT: 45.5 % (ref 36.0–46.0)
HEMOGLOBIN: 14.5 g/dL (ref 12.0–15.0)
MCH: 26.2 pg (ref 26.0–34.0)
MCHC: 31.9 g/dL (ref 30.0–36.0)
MCV: 82.1 fL (ref 78.0–100.0)
Platelets: 270 10*3/uL (ref 150–400)
RBC: 5.54 MIL/uL — AB (ref 3.87–5.11)
RDW: 14.7 % (ref 11.5–15.5)
WBC: 7.5 10*3/uL (ref 4.0–10.5)

## 2017-05-31 LAB — URINALYSIS, ROUTINE W REFLEX MICROSCOPIC
Bacteria, UA: NONE SEEN
Bilirubin Urine: NEGATIVE
GLUCOSE, UA: NEGATIVE mg/dL
KETONES UR: NEGATIVE mg/dL
Leukocytes, UA: NEGATIVE
NITRITE: NEGATIVE
PROTEIN: NEGATIVE mg/dL
Specific Gravity, Urine: 1.026 (ref 1.005–1.030)
pH: 5 (ref 5.0–8.0)

## 2017-05-31 LAB — PREGNANCY, URINE: Preg Test, Ur: NEGATIVE

## 2017-05-31 LAB — LIPASE, BLOOD: LIPASE: 27 U/L (ref 11–51)

## 2017-05-31 IMAGING — CT CT ABD-PELV W/ CM
2 of 5 series · 16 of 46 positions shown, 18 images · IV contrast (Isovue)
Comparison: [DATE]

CLINICAL DATA: Abdominal pain. Nausea and diarrhea. Diverticulitis
suspected.

EXAM:
CT ABDOMEN AND PELVIS WITH CONTRAST
TECHNIQUE: Multidetector CT imaging of the abdomen and pelvis was performed
using the standard protocol following bolus administration of
intravenous contrast.
CONTRAST:  100mL [DJ] IOPAMIDOL ([DJ]) INJECTION 61%

[Series 2: axial st · axial · 0.98mm/px · z∈[+982,+1367]mm · 13 of 87 slices shown, 15 images]
[im 5/87  soft-tissue]
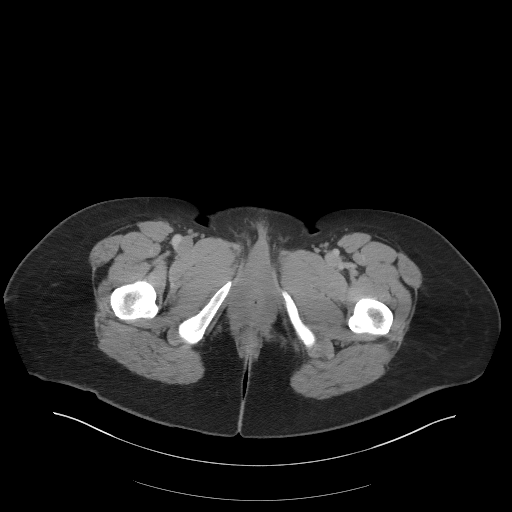
[im 5/87  bone]
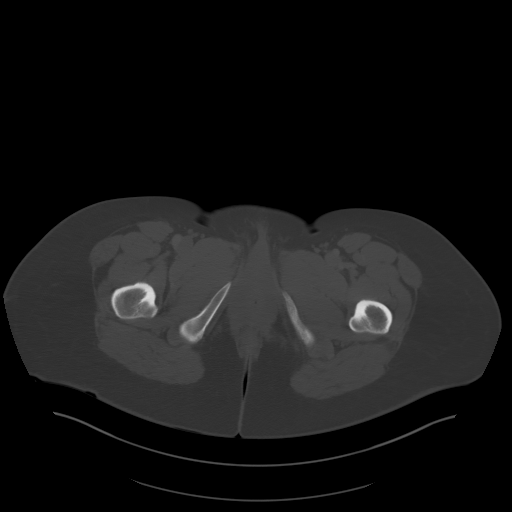
[im 10/87  soft-tissue]
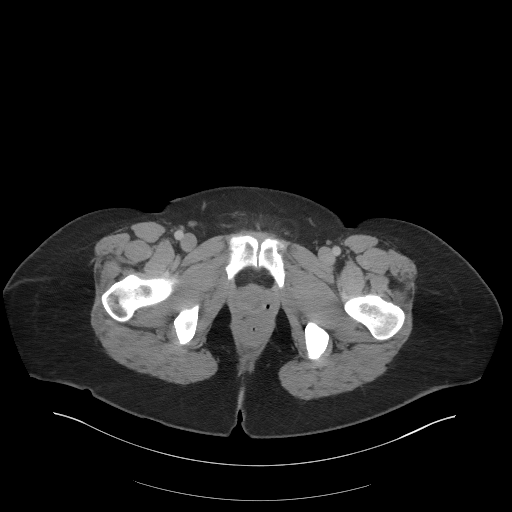
[im 20/87  soft-tissue]
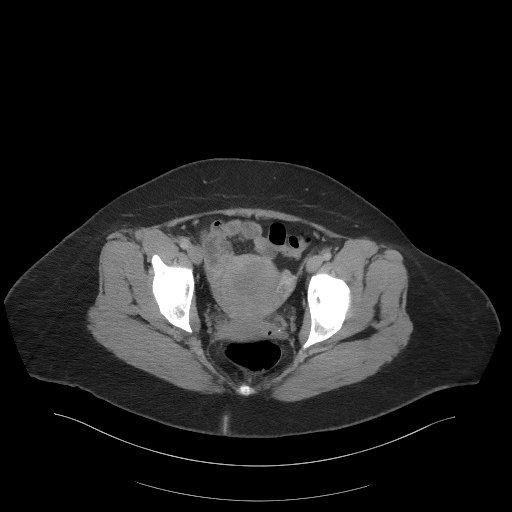
[im 24/87  soft-tissue]
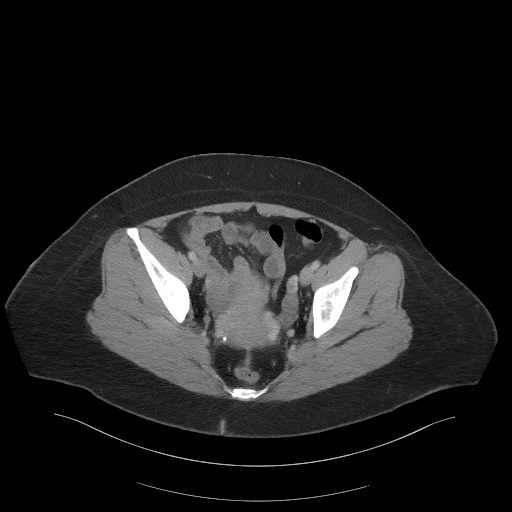
[im 29/87  soft-tissue]
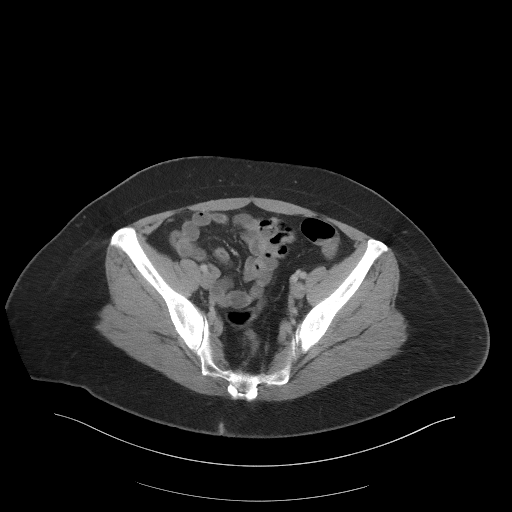
[im 39/87  soft-tissue]
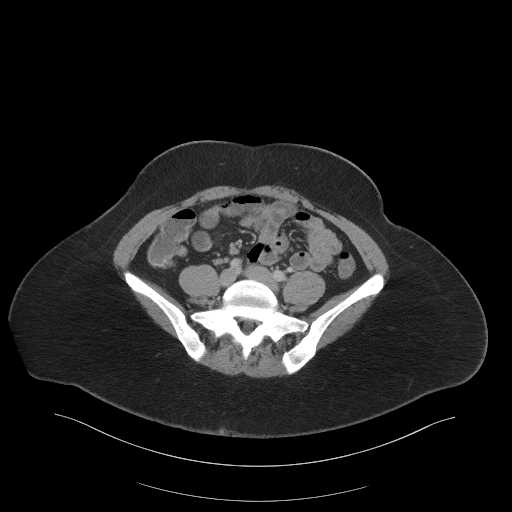
[im 44/87  soft-tissue]
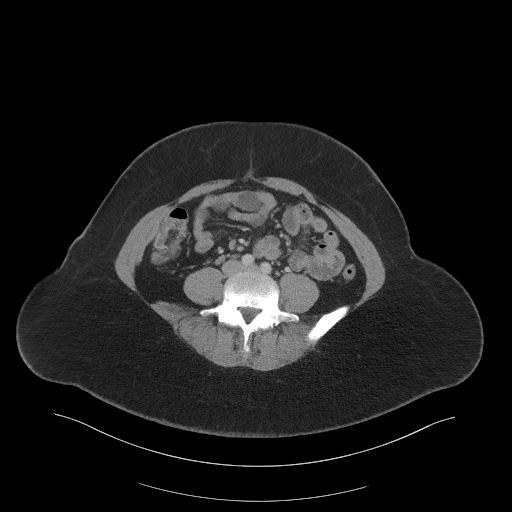
[im 48/87  soft-tissue]
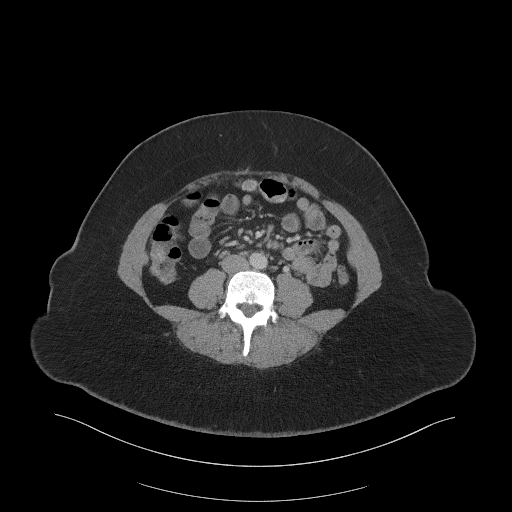
[im 58/87  soft-tissue]
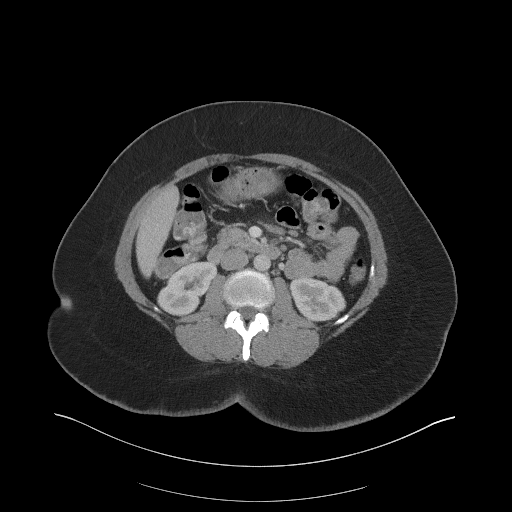
[im 58/87  bone]
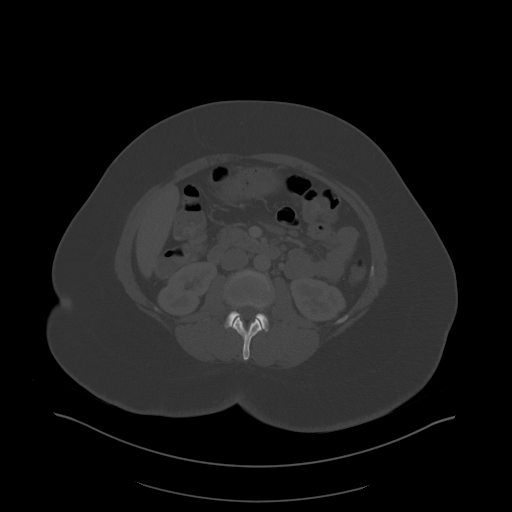
[im 63/87  soft-tissue]
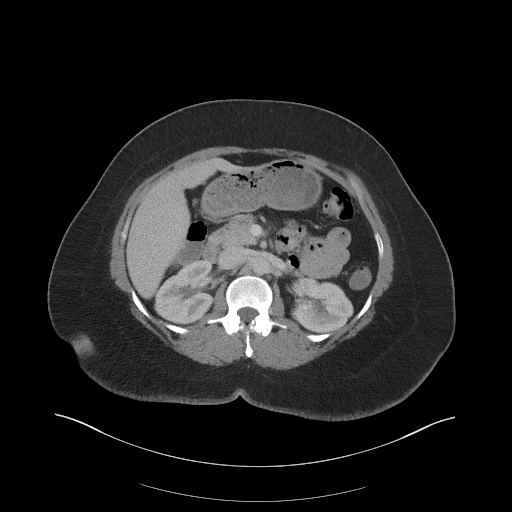
[im 67/87  soft-tissue]
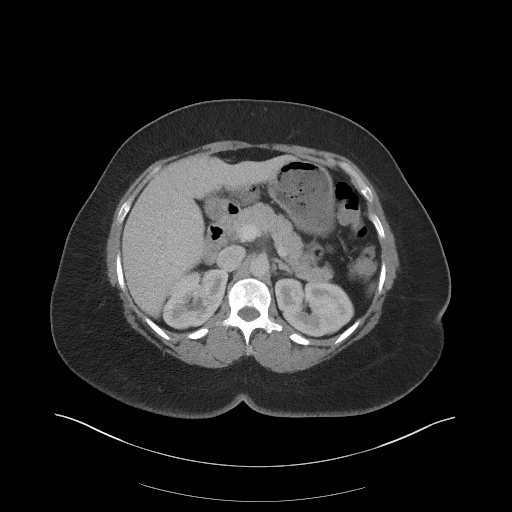
[im 77/87  soft-tissue]
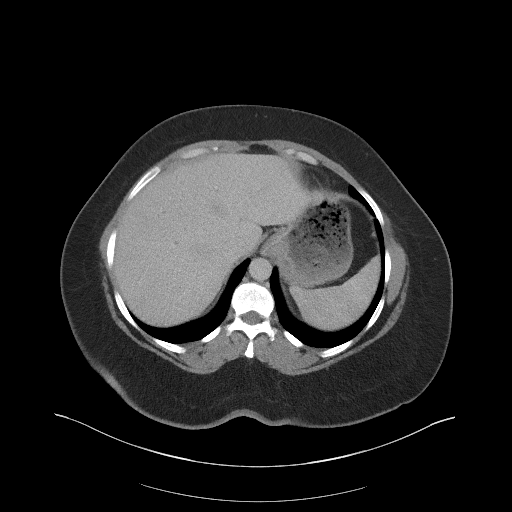
[im 82/87  soft-tissue]
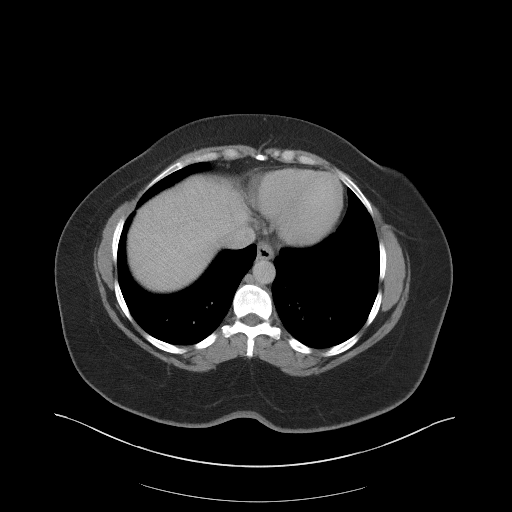

[Series 5: coronal st · coronal · 0.76mm/px · 3 of 86 slices shown]
[im 29/86  soft-tissue]
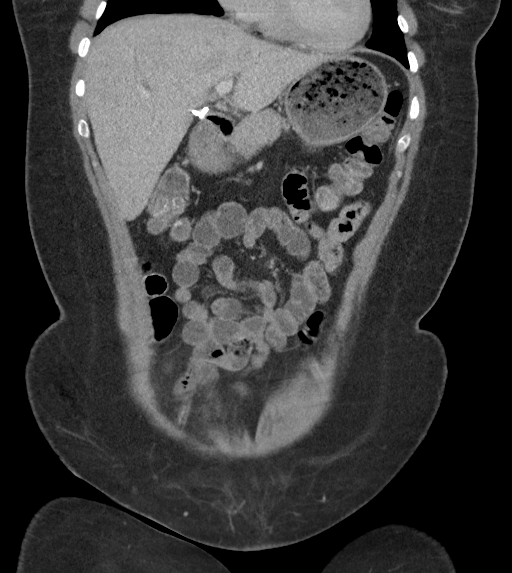
[im 38/86  soft-tissue]
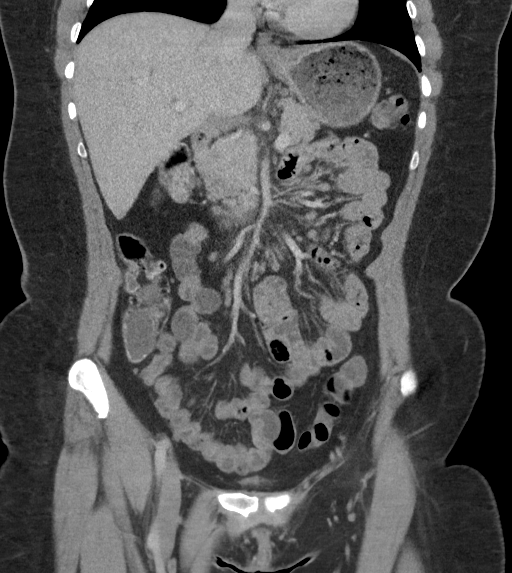
[im 48/86  soft-tissue]
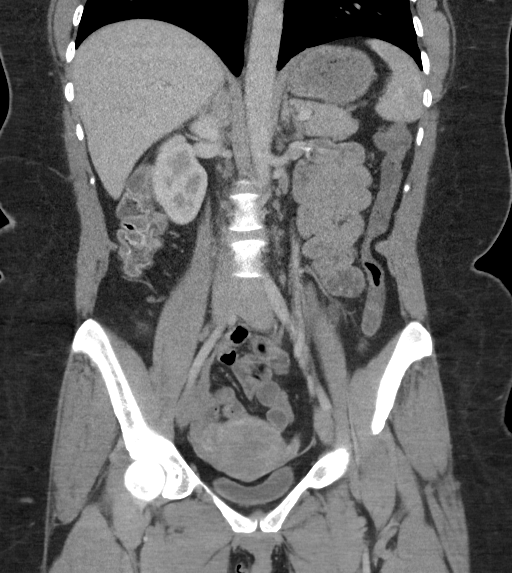

[16 of 46 positions shown; findings below may reference images not displayed]

FINDINGS: Lower chest: The lung bases are clear.

Hepatobiliary: No focal hepatic lesion. Clips in the gallbladder
fossa postcholecystectomy. No biliary dilatation.

Pancreas: No ductal dilatation or inflammation.

Spleen: Normal in size without focal abnormality.

Adrenals/Urinary Tract: Normal adrenal glands. No hydronephrosis or
perinephric edema. Homogeneous renal enhancement with symmetric
excretion on delayed phase imaging. Urinary bladder is nondistended
without wall thickening.

Stomach/Bowel: Tiny hiatal hernia. Stomach distended with ingested
contents. No small bowel dilatation, wall thickening or
inflammation. Normal appendix. No colonic wall thickening,
inflammation or obstruction. Small colonic stool burden with minimal
liquid stool in the descending colon.

Vascular/Lymphatic: Normal caliber abdominal aorta. Small central
mesenteric nodes, decreased in size from prior exam.

Reproductive: Prominent adnexal vascularity, left greater than
right, with prominence of left ovarian vein measuring 6 mm. No
adnexal mass.

Other: No abdominal wall hernia or abnormality. No abdominopelvic
ascites.

Musculoskeletal: There are no acute or suspicious osseous
abnormalities.
IMPRESSION: 1. Stomach distended with ingested material, this may be related to
recent p.o. intake, alternatively, gastroparesis could produce a
similar appearance.
2. Minimal liquid stool in the colon, suggesting diarrheal process.
No colonic inflammation.
3. Prominent adnexal vascularity, left greater than right, which can
be seen with pelvic congestion syndrome.

## 2017-05-31 MED ORDER — ONDANSETRON HCL 4 MG/2ML IJ SOLN
4.0000 mg | Freq: Once | INTRAMUSCULAR | Status: AC
  Administered 2017-05-31: 4 mg via INTRAVENOUS
  Filled 2017-05-31: qty 2

## 2017-05-31 MED ORDER — IOPAMIDOL (ISOVUE-300) INJECTION 61%
100.0000 mL | Freq: Once | INTRAVENOUS | Status: AC | PRN
  Administered 2017-06-01: 100 mL via INTRAVENOUS

## 2017-05-31 MED ORDER — SODIUM CHLORIDE 0.9 % IV BOLUS (SEPSIS)
1000.0000 mL | Freq: Once | INTRAVENOUS | Status: AC
  Administered 2017-05-31: 1000 mL via INTRAVENOUS

## 2017-05-31 NOTE — ED Triage Notes (Signed)
Pt c/o n/d x 4 days with abd pain. Pt also reports pain in chest.

## 2017-05-31 NOTE — ED Provider Notes (Signed)
[facility] EMERGENCY DEPARTMENT Provider Note   CSN: [id number] Arrival date & time: 05/31/17  1650     History   Chief Complaint Chief Complaint  Patient presents with  . Abdominal Pain    HPI Joyce [name] is a 42 y.o. female.  Patient presents with complaints of abdominal pain with diarrhea that has been ongoing for 2 days.  Symptoms started yesterday and have worsened today.  She has been having watery diarrhea every 1-2 hours to the course of the day.  She has had nausea, vomited once, but has not been eating or drinking much so no further emesis.  She reports diffuse mid and lower abdominal cramping pain.  She has not had a fever.  No recent antibiotic use.      Past Medical History:  Diagnosis Date  . AMA (advanced maternal age) multigravida 35+ 11/12/2014  . Anemia   . Depression   . Hypertension   . Migraine   . Pregnant 11/12/2014  . PTSD (post-traumatic stress disorder) 05/01/2017  . Round ligament pain 11/12/2014  . Short of breath on exertion 11/12/2014  . Trichomonas infection     Patient Active Problem List   Diagnosis Date Noted  . MDD (major depressive disorder), recurrent episode, moderate ([place]) 05/01/2017  . PTSD (post-traumatic stress disorder) 05/01/2017  . Depression, recurrent ([place]) 12/07/2016  . Essential hypertension 07/20/2016  . Uterine fibroid 01/27/2016  . 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 [place] multiparity 11/26/2014    Past Surgical History:  Procedure Laterality Date  . CHOLECYSTECTOMY      OB History    Gravida Para Term Preterm AB Living   11 8 8  0 3 8   SAB TAB Ectopic Multiple Live Births   1 2   0 8       Home Medications    Prior to Admission medications   Medication Sig Start Date End Date Taking? Authorizing Provider  escitalopram (LEXAPRO) 20 MG tablet Take 1 tablet (20 mg total) by mouth daily. 05/01/17  Yes Norman Clay, MD  ibuprofen (ADVIL,MOTRIN) 200 MG tablet  Take 200 mg by mouth every 5 (five) hours as needed for mild pain or moderate pain.   Yes [provider]  lisinopril-hydrochlorothiazide (PRINZIDE,ZESTORETIC) 20-25 MG tablet take one tablet by mouth once daily 04/04/17  Yes [provider]  medroxyPROGESTERone (DEPO-PROVERA) 150 MG/ML injection Inject 1 mL (150 mg total) into the muscle every 3 (three) months. 07/12/16  Yes Cresenzo-Dishmon, Joaquim Lai, CNM  Prenatal Vit-Fe Fumarate-FA (PREPLUS) 27-1 MG TABS TAKE 1 TABLET BY MOUTH DAILY AT 12 NOON 04/04/17  Yes Derrek Monaco A, NP  SUMAtriptan (IMITREX) 50 MG tablet Take 1 tablet (50 mg total) by mouth once. May repeat in 2 hours if headache persists or recurs. 02/15/17 05/31/17 Yes Raylene Everts, MD  traZODone (DESYREL) 50 MG tablet 25-50 mg at night as needed for sleep Patient taking differently: Take 50 mg by mouth every other day. Takes every other  Night at bedtime 05/01/17  Yes Hisada, Elie Goody, MD  diphenoxylate-atropine (LOMOTIL) 2.5-0.025 MG tablet Take 2 tablets by mouth 4 (four) times daily as needed for diarrhea or loose stools. 06/01/17   Orpah Greek, MD  promethazine (PHENERGAN) 25 MG tablet Take 1 tablet (25 mg total) by mouth every 6 (six) hours as needed for nausea or vomiting. 06/01/17   Orpah Greek, MD    Family History Family History  Problem Relation Age of Onset  .  Diabetes Mother   . Hypertension Mother   . Kidney disease Mother   . Asthma Mother   . Hyperlipidemia Mother   . Alzheimer's disease Father   . Diabetes Father   . Kidney disease Sister   . Asthma Son   . Diabetes Sister   . Asthma Son   . ADD / ADHD Son   . Heart murmur Son   . Asthma Son     Social History Social History   Tobacco Use  . Smoking status: Never Smoker  . Smokeless tobacco: Never Used  Substance Use Topics  . Alcohol use: No  . Drug use: No     Allergies   Patient has no known allergies.   Review of Systems Review of Systems    Gastrointestinal: Positive for abdominal pain, diarrhea and nausea.  All other systems reviewed and are negative.    Physical Exam Updated Vital Signs BP 128/70 (BP Location: Right Arm)   Pulse 65   Temp 98.7 F (37.1 C)   Resp 16   Ht 5\' 5"  (1.651 m)   Wt 99.8 kg (220 lb)   SpO2 99%   BMI 36.61 kg/m   Physical Exam  Constitutional: She is oriented to person, place, and time. She appears well-developed and well-nourished. No distress.  HENT:  Head: Normocephalic and atraumatic.  Right Ear: Hearing normal.  Left Ear: Hearing normal.  Nose: Nose normal.  Mouth/Throat: Oropharynx is clear and moist and mucous membranes are normal.  Eyes: Conjunctivae and EOM are normal. Pupils are equal, round, and reactive to light.  Neck: Normal range of motion. Neck supple.  Cardiovascular: Regular rhythm, S1 normal and S2 normal. Exam reveals no gallop and no friction rub.  No murmur heard. Pulmonary/Chest: Effort normal and breath sounds normal. No respiratory distress. She exhibits no tenderness.  Abdominal: Soft. Normal appearance and bowel sounds are normal. There is no hepatosplenomegaly. There is tenderness in the right lower quadrant, periumbilical area, suprapubic area and left lower quadrant. There is no rebound, no guarding, no tenderness at McBurney's point and negative Murphy's sign. No hernia.  Musculoskeletal: Normal range of motion.  Neurological: She is alert and oriented to person, place, and time. She has normal strength. No cranial nerve deficit or sensory deficit. Coordination normal. GCS eye subscore is 4. GCS verbal subscore is 5. GCS motor subscore is 6.  Skin: Skin is warm, dry and intact. No rash noted. No cyanosis.  Psychiatric: She has a normal mood and affect. Her speech is normal and behavior is normal. Thought content normal.  Nursing note and vitals reviewed.    ED Treatments / Results  Labs (all labs ordered are listed, but only abnormal results are  displayed) Labs Reviewed  CBC - Abnormal; Notable for the following components:      Result Value   RBC 5.54 (*)    All other components within normal limits  URINALYSIS, ROUTINE W REFLEX MICROSCOPIC - Abnormal; Notable for the following components:   Hgb urine dipstick MODERATE (*)    Squamous Epithelial / LPF 0-5 (*)    All other components within normal limits  LIPASE, BLOOD  COMPREHENSIVE METABOLIC PANEL  PREGNANCY, URINE    EKG  EKG Interpretation  Date/Time:  Wednesday May 31 2017 17:42:54 EST Ventricular Rate:  77 PR Interval:  184 QRS Duration: 74 QT Interval:  372 QTC Calculation: 420 R Axis:   52 Text Interpretation:  Normal sinus rhythm with sinus arrhythmia Normal ECG since last tracing  no significant change Confirmed by Daleen Bo [phone]) on 05/31/2017 5:56:26 PM       Radiology Ct Abdomen Pelvis W Contrast  Result Date: 06/01/2017 CLINICAL DATA:  Abdominal pain. Nausea and diarrhea. Diverticulitis suspected. EXAM: CT ABDOMEN AND PELVIS WITH CONTRAST TECHNIQUE: Multidetector CT imaging of the abdomen and pelvis was performed using the standard protocol following bolus administration of intravenous contrast. CONTRAST:  160mL ISOVUE-300 IOPAMIDOL (ISOVUE-300) INJECTION 61% COMPARISON:  07/03/2013 FINDINGS: Lower chest: The lung bases are clear. Hepatobiliary: No focal hepatic lesion. Clips in the gallbladder fossa postcholecystectomy. No biliary dilatation. Pancreas: No ductal dilatation or inflammation. Spleen: Normal in size without focal abnormality. Adrenals/Urinary Tract: Normal adrenal glands. No hydronephrosis or perinephric edema. Homogeneous renal enhancement with symmetric excretion on delayed phase imaging. Urinary bladder is nondistended without wall thickening. Stomach/Bowel: Tiny hiatal hernia. Stomach distended with ingested contents. No small bowel dilatation, wall thickening or inflammation. Normal appendix. No colonic wall thickening, inflammation or  obstruction. Small colonic stool burden with minimal liquid stool in the descending colon. Vascular/Lymphatic: Normal caliber abdominal aorta. Small central mesenteric nodes, decreased in size from prior exam. Reproductive: Prominent adnexal vascularity, left greater than right, with prominence of left ovarian vein measuring 6 mm. No adnexal mass. Other: No abdominal wall hernia or abnormality. No abdominopelvic ascites. Musculoskeletal: There are no acute or suspicious osseous abnormalities. IMPRESSION: 1. Stomach distended with ingested material, this may be related to recent p.o. intake, alternatively, gastroparesis could produce a similar appearance. 2. Minimal liquid stool in the colon, suggesting diarrheal process. No colonic inflammation. 3. Prominent adnexal vascularity, left greater than right, which can be seen with pelvic congestion syndrome. Electronically Signed   By: Jeb Levering M.D.   On: 06/01/2017 00:41    Procedures Procedures (including critical care time)  Medications Ordered in ED Medications  sodium chloride 0.9 % bolus 1,000 mL (1,000 mLs Intravenous New Bag/Given 05/31/17 2350)  ondansetron (ZOFRAN) injection 4 mg (4 mg Intravenous Given 05/31/17 2354)  iopamidol (ISOVUE-300) 61 % injection 100 mL (100 mLs Intravenous Contrast Given 06/01/17 0016)     Initial Impression / Assessment and Plan / ED Course  I have reviewed the triage vital signs and the nursing notes.  Pertinent labs & imaging results that were available during my care of the patient were reviewed by me and considered in my medical decision making (see chart for details).     Patient presents to the emergency department for evaluation of abdominal pain, nausea and watery diarrhea.  She does not have any sick contacts.  She did have diffuse lower abdominal tenderness on examination, but no signs of peritonitis.  Blood work was unremarkable.  Patient hydrated here in the ER.  She had a CAT scan to further  evaluate for the possibility of colitis, diverticulitis, etc.  CAT scan does not show anything acute other than possible diarrheal process and distended abdomen, nonspecific findings.  Patient will be discharged with continued symptomatic treatment.  Final Clinical Impressions(s) / ED Diagnoses   Final diagnoses:  Gastroenteritis    ED Discharge Orders        Ordered    diphenoxylate-atropine (LOMOTIL) 2.5-0.025 MG tablet  4 times daily PRN     06/01/17 0056    promethazine (PHENERGAN) 25 MG tablet  Every 6 hours PRN     06/01/17 0056       Orpah Greek, MD 06/01/17 [phone]
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
## 2017-06-01 MED ORDER — DIPHENOXYLATE-ATROPINE 2.5-0.025 MG PO TABS
2.0000 | ORAL_TABLET | Freq: Once | ORAL | Status: AC
  Administered 2017-06-01: 2 via ORAL
  Filled 2017-06-01: qty 2

## 2017-06-01 MED ORDER — PROMETHAZINE HCL 25 MG PO TABS: 25.0000 mg | ORAL_TABLET | Freq: Four times a day (QID) | ORAL | 0 refills | Status: DC | PRN

## 2017-06-01 MED ORDER — DIPHENOXYLATE-ATROPINE 2.5-0.025 MG PO TABS: 2.0000 | ORAL_TABLET | Freq: Four times a day (QID) | ORAL | 0 refills | Status: DC | PRN

## 2017-06-05 ENCOUNTER — Ambulatory Visit (HOSPITAL_COMMUNITY): Payer: Medicaid Other | Admitting: Psychiatry
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
## 2017-06-13 ENCOUNTER — Other Ambulatory Visit: Payer: Self-pay | Admitting: Advanced Practice Midwife

## 2017-06-13 ENCOUNTER — Ambulatory Visit (INDEPENDENT_AMBULATORY_CARE_PROVIDER_SITE_OTHER): Payer: Medicaid Other | Admitting: *Deleted

## 2017-06-13 ENCOUNTER — Telehealth: Payer: Self-pay | Admitting: Advanced Practice Midwife

## 2017-06-13 DIAGNOSIS — Z3042 Encounter for surveillance of injectable contraceptive: Secondary | ICD-10-CM

## 2017-06-13 DIAGNOSIS — Z3202 Encounter for pregnancy test, result negative: Secondary | ICD-10-CM | POA: Diagnosis not present

## 2017-06-13 LAB — POCT URINE PREGNANCY: PREG TEST UR: NEGATIVE

## 2017-06-13 MED ORDER — MEDROXYPROGESTERONE ACETATE 150 MG/ML IM SUSP
150.0000 mg | Freq: Once | INTRAMUSCULAR | Status: AC
  Administered 2017-06-13: 150 mg via INTRAMUSCULAR

## 2017-06-13 MED ORDER — MEDROXYPROGESTERONE ACETATE 150 MG/ML IM SUSP: 150.0000 mg | INTRAMUSCULAR | 3 refills | Status: DC

## 2017-06-13 NOTE — Telephone Encounter (Signed)
Patient called stating that her pharmacy stated that she does not have anymore refill of her depo, pt states she has an appointment today. Please contact pt

## 2017-06-13 NOTE — Progress Notes (Signed)
Pt given Depo Provera 150 mg IM left deltoid. Pt tolerated well. Next dose in 12 weeks.

## 2017-06-13 NOTE — Progress Notes (Signed)
Depo refilled  

## 2017-06-14 ENCOUNTER — Ambulatory Visit (INDEPENDENT_AMBULATORY_CARE_PROVIDER_SITE_OTHER): Payer: Medicaid Other | Admitting: Psychiatry

## 2017-06-14 ENCOUNTER — Ambulatory Visit: Payer: Medicaid Other

## 2017-06-14 ENCOUNTER — Encounter (HOSPITAL_COMMUNITY): Payer: Self-pay | Admitting: Psychiatry

## 2017-06-14 VITALS — BP 146/93 | HR 84 | Ht 65.0 in | Wt 225.0 lb

## 2017-06-14 DIAGNOSIS — Z810 Family history of intellectual disabilities: Secondary | ICD-10-CM | POA: Diagnosis not present

## 2017-06-14 DIAGNOSIS — G4700 Insomnia, unspecified: Secondary | ICD-10-CM

## 2017-06-14 DIAGNOSIS — R450 Nervousness: Secondary | ICD-10-CM | POA: Diagnosis not present

## 2017-06-14 DIAGNOSIS — F410 Panic disorder [episodic paroxysmal anxiety] without agoraphobia: Secondary | ICD-10-CM | POA: Diagnosis not present

## 2017-06-14 DIAGNOSIS — F4310 Post-traumatic stress disorder, unspecified: Secondary | ICD-10-CM

## 2017-06-14 DIAGNOSIS — Z630 Problems in relationship with spouse or partner: Secondary | ICD-10-CM

## 2017-06-14 DIAGNOSIS — F419 Anxiety disorder, unspecified: Secondary | ICD-10-CM | POA: Diagnosis not present

## 2017-06-14 DIAGNOSIS — F331 Major depressive disorder, recurrent, moderate: Secondary | ICD-10-CM

## 2017-06-14 MED ORDER — ESCITALOPRAM OXALATE 20 MG PO TABS: 20.0000 mg | ORAL_TABLET | Freq: Every day | ORAL | 0 refills | Status: DC

## 2017-06-14 MED ORDER — QUETIAPINE FUMARATE 25 MG PO TABS: 25.0000 mg | ORAL_TABLET | Freq: Every day | ORAL | 0 refills | Status: DC

## 2017-06-14 NOTE — Patient Instructions (Signed)
1. Continue lexapro 20 mg daily  2. Start quetiapine 25 mg at night 3. Continue Trazodone 50 mg at night as needed for sleep - Check  TSH 5. Return to clinic in one month for 30 mins

## 2017-06-15 ENCOUNTER — Other Ambulatory Visit (HOSPITAL_COMMUNITY): Payer: Self-pay | Admitting: Psychiatry

## 2017-06-15 MED ORDER — TRAZODONE HCL 50 MG PO TABS: 50.0000 mg | ORAL_TABLET | Freq: Every evening | ORAL | 0 refills | Status: DC | PRN

## 2017-07-04 ENCOUNTER — Ambulatory Visit (HOSPITAL_COMMUNITY): Payer: Self-pay | Admitting: Psychiatry

## 2017-07-11 ENCOUNTER — Other Ambulatory Visit: Payer: Self-pay

## 2017-07-11 ENCOUNTER — Encounter (HOSPITAL_COMMUNITY): Payer: Self-pay | Admitting: Emergency Medicine

## 2017-07-11 ENCOUNTER — Emergency Department (HOSPITAL_COMMUNITY)
Admission: EM | Admit: 2017-07-11 | Discharge: 2017-07-11 | Disposition: A | Discharge status: Home / Self Care | Payer: Medicaid Other | Attending: Emergency Medicine | Admitting: Emergency Medicine

## 2017-07-11 DIAGNOSIS — I10 Essential (primary) hypertension: Secondary | ICD-10-CM | POA: Diagnosis not present

## 2017-07-11 DIAGNOSIS — J069 Acute upper respiratory infection, unspecified: Secondary | ICD-10-CM | POA: Insufficient documentation

## 2017-07-11 DIAGNOSIS — Z79899 Other long term (current) drug therapy: Secondary | ICD-10-CM | POA: Diagnosis not present

## 2017-07-11 DIAGNOSIS — R51 Headache: Secondary | ICD-10-CM | POA: Diagnosis present

## 2017-07-11 MED ORDER — IBUPROFEN 800 MG PO TABS: 800.0000 mg | ORAL_TABLET | Freq: Three times a day (TID) | ORAL | 0 refills | Status: DC | PRN

## 2017-07-11 MED ORDER — IBUPROFEN 800 MG PO TABS
800.0000 mg | ORAL_TABLET | Freq: Once | ORAL | Status: AC
  Administered 2017-07-11: 800 mg via ORAL
  Filled 2017-07-11: qty 1

## 2017-07-11 NOTE — ED Triage Notes (Signed)
Patient reports headache that started on Friday, sore throat that started this am and body aches that started yesterday.

## 2017-07-11 NOTE — Discharge Instructions (Signed)
Rest,  Drink plenty of fluids.  Take motrin or tylenol for achiness, sore throat and body aches.  Get rechecked for any worsened symptoms including increased shortness of breath,  Increased fever or increasing weakness.

## 2017-07-11 NOTE — ED Provider Notes (Signed)
[facility] Provider Note   CSN: [id number] Arrival date & time: 07/11/17  1710     History   Chief Complaint Chief Complaint  Patient presents with  . Headache    HPI Joyce [name] is a 42 y.o. female with past medical history as outlined below presenting with URI type symptoms including headache, nasal congestion with clear rhinorrhea and generalized fatigue, mild cough which is been nonproductive.  She reports she may have had some fever but did not check.  She denies shortness of breath, wheezing, chest pain and has had no nausea vomiting or diarrhea.  The ear pain or sinus pain.  Her symptoms started yesterday.  Her daughter also presents with similar symptoms.  To their knowledge neither has been exposed to influenza or strep throat.  The history is provided by the patient.    Past Medical History:  Diagnosis Date  . AMA (advanced maternal age) multigravida 35+ 11/12/2014  . Anemia   . Depression   . Hypertension   . Migraine   . Pregnant 11/12/2014  . PTSD (post-traumatic stress disorder) 05/01/2017  . Round ligament pain 11/12/2014  . Short of breath on exertion 11/12/2014  . Trichomonas infection     Patient Active Problem List   Diagnosis Date Noted  . MDD (major depressive disorder), recurrent episode, moderate ([place]) 05/01/2017  . PTSD (post-traumatic stress disorder) 05/01/2017  . Depression, recurrent ([place]) 12/07/2016  . Essential hypertension 07/20/2016  . Uterine fibroid 01/27/2016  . 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 [place] multiparity 11/26/2014    Past Surgical History:  Procedure Laterality Date  . CHOLECYSTECTOMY      OB History    Gravida Para Term Preterm AB Living   11 8 8  0 3 8   SAB TAB Ectopic Multiple Live Births   1 2   0 8       Home Medications    Prior to Admission medications   Medication Sig Start Date End Date Taking? Authorizing Provider    diphenoxylate-atropine (LOMOTIL) 2.5-0.025 MG tablet Take 2 tablets by mouth 4 (four) times daily as needed for diarrhea or loose stools. 06/01/17   Orpah Greek, MD  escitalopram (LEXAPRO) 20 MG tablet Take 1 tablet (20 mg total) by mouth daily. 06/14/17   Norman Clay, MD  ibuprofen (ADVIL,MOTRIN) 800 MG tablet Take 1 tablet (800 mg total) by mouth every 8 (eight) hours as needed for headache. 07/11/17   Nayah Lukens, Almyra Free, PA-C  lisinopril-hydrochlorothiazide (PRINZIDE,ZESTORETIC) 20-25 MG tablet take one tablet by mouth once daily 04/04/17   [provider]  medroxyPROGESTERone (DEPO-PROVERA) 150 MG/ML injection Inject 1 mL (150 mg total) into the muscle every 3 (three) months. 06/13/17   Cresenzo-Dishmon, Joaquim Lai, CNM  Prenatal Vit-Fe Fumarate-FA (PREPLUS) 27-1 MG TABS TAKE 1 TABLET BY MOUTH DAILY AT 12 NOON 04/04/17   Estill Dooms, NP  promethazine (PHENERGAN) 25 MG tablet Take 1 tablet (25 mg total) by mouth every 6 (six) hours as needed for nausea or vomiting. 06/01/17   Orpah Greek, MD  QUEtiapine (SEROQUEL) 25 MG tablet Take 1 tablet (25 mg total) by mouth at bedtime. 06/14/17   Norman Clay, MD  SUMAtriptan (IMITREX) 50 MG tablet Take 1 tablet (50 mg total) by mouth once. May repeat in 2 hours if headache persists or recurs. 02/15/17 05/31/17  Raylene Everts, MD  traZODone (DESYREL) 50 MG tablet Take 1 tablet (50 mg total) by mouth at bedtime  as needed for sleep. 06/15/17   Norman Clay, MD    Family History Family History  Problem Relation Age of Onset  . Diabetes Mother   . Hypertension Mother   . Kidney disease Mother   . Asthma Mother   . Hyperlipidemia Mother   . Alzheimer's disease Father   . Diabetes Father   . Kidney disease Sister   . Asthma Son   . Diabetes Sister   . Asthma Son   . ADD / ADHD Son   . Heart murmur Son   . Asthma Son     Social History Social History   Tobacco Use  . Smoking status: Never Smoker  . Smokeless tobacco:  Never Used  Substance Use Topics  . Alcohol use: No  . Drug use: No     Allergies   Patient has no known allergies.   Review of Systems Review of Systems  Constitutional: Positive for fatigue and fever. Negative for chills.  HENT: Positive for congestion, rhinorrhea and sore throat. Negative for ear pain, sinus pressure, trouble swallowing and voice change.   Eyes: Negative for discharge.  Respiratory: Positive for cough. Negative for shortness of breath, wheezing and stridor.   Cardiovascular: Negative for chest pain.  Gastrointestinal: Negative for abdominal pain.  Genitourinary: Negative.   Musculoskeletal: Negative for myalgias.  Neurological: Positive for headaches.     Physical Exam Updated Vital Signs BP 139/88 (BP Location: Right Arm)   Pulse 82   Temp 98.5 F (36.9 C) (Oral)   Resp 18   Ht 5\' 5"  (1.651 m)   Wt 99.8 kg (220 lb)   SpO2 100%   BMI 36.61 kg/m   Physical Exam  Constitutional: She is oriented to person, place, and time. She appears well-developed and well-nourished.  HENT:  Head: Normocephalic and atraumatic.  Right Ear: Tympanic membrane and ear canal normal.  Left Ear: Tympanic membrane and ear canal normal.  Nose: Rhinorrhea present. No mucosal edema.  Mouth/Throat: Uvula is midline, oropharynx is clear and moist and mucous membranes are normal. No oropharyngeal exudate, posterior oropharyngeal edema, posterior oropharyngeal erythema or tonsillar abscesses. No tonsillar exudate.  Eyes: Conjunctivae are normal.  Cardiovascular: Normal rate and normal heart sounds.  Pulmonary/Chest: Effort normal. No respiratory distress. She has no wheezes. She has no rales.  Musculoskeletal: Normal range of motion.  Neurological: She is alert and oriented to person, place, and time. She has normal strength. No cranial nerve deficit or sensory deficit.  Skin: Skin is warm and dry. No rash noted.  Psychiatric: She has a normal mood and affect.     ED  Treatments / Results  Labs (all labs ordered are listed, but only abnormal results are displayed) Labs Reviewed - No data to display  EKG  EKG Interpretation None       Radiology No results found.  Procedures Procedures (including critical care time)  Medications Ordered in ED Medications  ibuprofen (ADVIL,MOTRIN) tablet 800 mg (800 mg Oral Given 07/11/17 1859)     Initial Impression / Assessment and Plan / ED Course  I have reviewed the triage vital signs and the nursing notes.  Pertinent labs & imaging results that were available during my care of the patient were reviewed by me and considered in my medical decision making (see chart for details).     Patient with history and exam suggesting viral URI.  Her daughter was screened for strep throat and this was negative.  Patient does not have degree of  symptoms or progression of symptoms suggesting influenza.  I favor this being a viral upper respiratory infection.  Discussed home treatment, Tylenol or Motrin, rest, increase fluid intake.  PRN follow-up with PCP if symptoms persist or worsen.  Final Clinical Impressions(s) / ED Diagnoses   Final diagnoses:  Viral upper respiratory tract infection    ED Discharge Orders        Ordered    ibuprofen (ADVIL,MOTRIN) 800 MG tablet  Every 8 hours PRN     07/11/17 1955       Landis Martins 07/11/17 Audie Box, MD 07/11/17 2234

## 2017-07-18 ENCOUNTER — Ambulatory Visit (INDEPENDENT_AMBULATORY_CARE_PROVIDER_SITE_OTHER): Payer: Medicaid Other | Admitting: Psychiatry

## 2017-07-18 ENCOUNTER — Encounter (HOSPITAL_COMMUNITY): Payer: Self-pay | Admitting: Psychiatry

## 2017-07-18 DIAGNOSIS — F4310 Post-traumatic stress disorder, unspecified: Secondary | ICD-10-CM

## 2017-07-18 DIAGNOSIS — F331 Major depressive disorder, recurrent, moderate: Secondary | ICD-10-CM | POA: Diagnosis not present

## 2017-07-18 NOTE — Progress Notes (Signed)
   THERAPIST PROGRESS NOTE  Session Time:  Tuesday 07/18/2016  4:15 PM -  5:00 PM   Participation Level: Active  Behavioral Response: CasualAlertAnxious and Depressed/tearful  Type of Therapy: Individual Therapy  Treatment Goals addressed:  Learn and implement calming techniques to manage stress and anxiety  Interventions: ACT/Supportive  Summary: Joyce [name] is a 42 y.o. female who is  referred for services by PCP Dr. Meda Coffee due to experiencing symptoms of depression and anxiety.  Patient reports beginning to experience symptoms  of depression and anxiety when a young child but never receiving any treatment. She reports sleep difficulty and states being up all night due to worry about a variety of issues. She reports stress related to problems in the relationship with her fiance as he does not help out with the bills like he is supposed to per her report. She also reports conflict related to his ex-girlfriends calling him. She reports taking care of six children and 2 grandchildren as well as fiancee's two children. She also reports having to deal with fiance's m  other who also resides with the couple. Patient states feeling down, not wanting to interact with her children, not wanting to do anything, and having anxiety attacks. She reports no previous psychiatric hospitalizations or participation in outpatient therapy. She was prescribed lexapro by PCP about 6 months ago but says she takes it every blue moon.  Patient last was seen 2 months ago. She reports continued stress regarding the relationship with her boyfriend as he remains in contact with ex-girlfriends and still fails to contribute to the household in a responsible manner. She reports recently having an assertive conversation with him regarding this and he promised to change his ways. However, he has made and broken this promise many times in the past. She still is contemplating enforcing a February deadline for boyfriend and  his children to move out. She continues to express worry about how the move may affect his 2 youngest children. Patient continues to work but reports often being anxious and tearful at work due to thoughts about her situation. She reports continuing to practice deep breathing and says it has been helpful.   Suicidal/Homicidal: Nowithout intent/plan  Therapist Response:  reviewed symptoms, discussed stressors, facilitated expression of thoughts and feelings, validated feelings, reviewed realistic expectations of the relationship with boyfriend, praised and reinforced patient's use of deep breathing, used cognitive diffusion technique to assist patient cope with ruminating thoughts and painful emotions about relationship with boyfriend, asked patient to practice technique between sessions  Plan: Return again in 2 weeks.  Diagnosis: Axis I: Major Depressive Disorder, Recurrent, Moderate    Axis II: No diagnosis    BYNUM,PEGGY, LCSW 07/18/2017
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
## 2017-07-21 ENCOUNTER — Other Ambulatory Visit: Payer: Self-pay | Admitting: Advanced Practice Midwife

## 2017-07-24 ENCOUNTER — Ambulatory Visit (HOSPITAL_COMMUNITY): Payer: Medicaid Other | Admitting: Psychiatry

## 2017-07-25 ENCOUNTER — Encounter: Payer: Self-pay | Admitting: Advanced Practice Midwife

## 2017-07-26 ENCOUNTER — Telehealth (HOSPITAL_COMMUNITY): Payer: Self-pay | Admitting: Psychiatry

## 2017-07-26 MED ORDER — QUETIAPINE FUMARATE 25 MG PO TABS: 25.0000 mg | ORAL_TABLET | Freq: Every day | ORAL | 0 refills | Status: DC

## 2017-07-26 MED ORDER — TRAZODONE HCL 50 MG PO TABS: 50.0000 mg | ORAL_TABLET | Freq: Every evening | ORAL | 0 refills | Status: DC | PRN

## 2017-07-26 MED ORDER — ESCITALOPRAM OXALATE 20 MG PO TABS: 20.0000 mg | ORAL_TABLET | Freq: Every day | ORAL | 0 refills | Status: DC

## 2017-07-26 NOTE — Telephone Encounter (Signed)
Spoke with patient she thought her appointment was on 06/2817 & I informed that it was on 07/24/17. Transferred to [facility] office to rescheduled.

## 2017-07-26 NOTE — Telephone Encounter (Signed)
Received a refill request. Ordered for a month. Please contact the patient to make follow up appointment. Also remind her of no show policy.

## 2017-08-01 ENCOUNTER — Ambulatory Visit (HOSPITAL_COMMUNITY): Payer: Self-pay | Admitting: Psychiatry

## 2017-08-07 ENCOUNTER — Encounter: Payer: Self-pay | Admitting: Family Medicine
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
## 2017-08-16 ENCOUNTER — Ambulatory Visit (HOSPITAL_COMMUNITY): Payer: Self-pay | Admitting: Psychiatry
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
## 2017-08-22 ENCOUNTER — Ambulatory Visit (HOSPITAL_COMMUNITY): Payer: Medicaid Other | Admitting: Psychiatry

## 2017-08-23 ENCOUNTER — Ambulatory Visit (INDEPENDENT_AMBULATORY_CARE_PROVIDER_SITE_OTHER): Payer: Medicaid Other | Admitting: Psychiatry

## 2017-08-23 ENCOUNTER — Encounter (HOSPITAL_COMMUNITY): Payer: Self-pay | Admitting: Psychiatry

## 2017-08-23 DIAGNOSIS — F331 Major depressive disorder, recurrent, moderate: Secondary | ICD-10-CM

## 2017-08-23 NOTE — Progress Notes (Signed)
   THERAPIST PROGRESS NOTE  Session Time:  Wednesday 08/23/2017 4:12 PM -  5:00 PM         ` Participation Level: Active  Behavioral Response: CasualAlert/Irritable  Type of Therapy: Individual Therapy  Treatment Goals addressed:  Learn and implement calming techniques to manage stress and anxiety  Interventions: ACT/Supportive  Summary: Joyce [name] is a 42 y.o. female who is  referred for services by PCP Dr. Meda Coffee due to experiencing symptoms of depression and anxiety.  Patient reports beginning to experience symptoms  of depression and anxiety when a young child but never receiving any treatment. She reports sleep difficulty and states being up all night due to worry about a variety of issues. She reports stress related to problems in the relationship with her fiance as he does not help out with the bills like he is supposed to per her report. She also reports conflict related to his ex-girlfriends calling him. She reports taking care of six children and 2 grandchildren as well as fiancee's two children. She also reports having to deal with fiance's m  other who also resides with the couple. Patient states feeling down, not wanting to interact with her children, not wanting to do anything, and having anxiety attacks. She reports no previous psychiatric hospitalizations or participation in outpatient therapy. She was prescribed lexapro by PCP about 6 months ago but says she takes it every blue moon.  Patient last was seen 6 weeks ago. She reports decreased stress regarding her concerns that boyfriend may be cheating as she has learned "let things go". She reports she doesn't really have any proof he is cheating. She reports continued conflict in their relationship as they now are having disagreements about parenting their children. She reports recent incident in which she and boyfriend gave conflicting instructions to the children when the children talked to parents separately. Patient  reports fiancee commented patient reacts quickly and has mood swings. Patient agrees and says she doesn't want to be irritable. She reports she hasn't taken medication for the past month as she has run out and has missed appointment with psychiatrist. Per patient's report, she did not get reminder call from office regarding appointment.  She has scheduled another appointment with psychiatrist.    Suicidal/Homicidal: Nowithout intent/plan  Therapist Response:  reviewed symptoms, discussed stressors, facilitated expression of thoughts and feelings, discussed the importance of medication compliance, assisted patient identify ways to avoid missing appointments including use of calendar and alarms on phone, examined patient's pattern of interaction with boyfriend and children regarding parenting, discussed united parenting and assisted patient identify ways she could promote that through effective communication with boyfriend, assigned patient to practice steps discussed in session regarding talking with boyfriend about being united in giving instructions to children, assisted patient identify body's reaction to stress, discussed patient's emotion regulation difficulties,  reviewed rationale for practicing deep breathing consistently, assigned patient to practice consistently.   Plan: Return again in 2 weeks.  Diagnosis: Axis I: Major Depressive Disorder, Recurrent, Moderate    Axis II: No diagnosis    Carmita Boom, LCSW 08/23/2017

## 2017-08-28 ENCOUNTER — Ambulatory Visit (HOSPITAL_COMMUNITY): Payer: Self-pay | Admitting: Psychiatry
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
## 2017-09-05 ENCOUNTER — Ambulatory Visit (INDEPENDENT_AMBULATORY_CARE_PROVIDER_SITE_OTHER): Payer: Medicaid Other | Admitting: *Deleted

## 2017-09-05 ENCOUNTER — Encounter: Payer: Self-pay | Admitting: *Deleted

## 2017-09-05 DIAGNOSIS — Z3042 Encounter for surveillance of injectable contraceptive: Secondary | ICD-10-CM

## 2017-09-05 DIAGNOSIS — Z3202 Encounter for pregnancy test, result negative: Secondary | ICD-10-CM

## 2017-09-05 DIAGNOSIS — Z308 Encounter for other contraceptive management: Secondary | ICD-10-CM

## 2017-09-05 LAB — POCT URINE PREGNANCY: PREG TEST UR: NEGATIVE

## 2017-09-05 MED ORDER — MEDROXYPROGESTERONE ACETATE 150 MG/ML IM SUSP
150.0000 mg | Freq: Once | INTRAMUSCULAR | Status: AC
  Administered 2017-09-05: 150 mg via INTRAMUSCULAR

## 2017-09-05 NOTE — Progress Notes (Signed)
Pt here for Depo. Pt received shot in right deltoid. Pt tolerated shot well. Return in 12 weeks for next shot. JSY 

## 2017-09-06 ENCOUNTER — Ambulatory Visit (HOSPITAL_COMMUNITY): Payer: Medicaid Other | Admitting: Psychiatry

## 2017-09-18 ENCOUNTER — Ambulatory Visit (HOSPITAL_COMMUNITY): Payer: Self-pay | Admitting: Psychiatry

## 2017-09-20 ENCOUNTER — Ambulatory Visit (HOSPITAL_COMMUNITY): Payer: Self-pay | Admitting: Psychiatry
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
## 2017-10-09 ENCOUNTER — Ambulatory Visit (HOSPITAL_COMMUNITY): Payer: Medicaid Other | Admitting: Psychiatry

## 2017-10-11 ENCOUNTER — Other Ambulatory Visit (HOSPITAL_COMMUNITY): Payer: Self-pay | Admitting: Psychiatry

## 2017-10-11 MED ORDER — QUETIAPINE FUMARATE 25 MG PO TABS: 25.0000 mg | ORAL_TABLET | Freq: Every day | ORAL | 0 refills | Status: DC

## 2017-10-11 MED ORDER — ESCITALOPRAM OXALATE 20 MG PO TABS: 20.0000 mg | ORAL_TABLET | Freq: Every day | ORAL | 0 refills | Status: DC

## 2017-10-11 MED ORDER — TRAZODONE HCL 50 MG PO TABS: 50.0000 mg | ORAL_TABLET | Freq: Every evening | ORAL | 0 refills | Status: DC | PRN

## 2017-11-03 ENCOUNTER — Encounter (HOSPITAL_COMMUNITY): Payer: Self-pay | Admitting: Emergency Medicine

## 2017-11-03 ENCOUNTER — Other Ambulatory Visit: Payer: Self-pay

## 2017-11-03 ENCOUNTER — Emergency Department (HOSPITAL_COMMUNITY)
Admission: EM | Admit: 2017-11-03 | Discharge: 2017-11-03 | Disposition: A | Discharge status: Home / Self Care | Payer: Self-pay | Attending: Emergency Medicine | Admitting: Emergency Medicine

## 2017-11-03 DIAGNOSIS — Z79899 Other long term (current) drug therapy: Secondary | ICD-10-CM | POA: Insufficient documentation

## 2017-11-03 DIAGNOSIS — I10 Essential (primary) hypertension: Secondary | ICD-10-CM | POA: Insufficient documentation

## 2017-11-03 DIAGNOSIS — M25512 Pain in left shoulder: Secondary | ICD-10-CM | POA: Insufficient documentation

## 2017-11-03 MED ORDER — ACETAMINOPHEN 325 MG PO TABS
650.0000 mg | ORAL_TABLET | Freq: Once | ORAL | Status: AC
  Administered 2017-11-03: 650 mg via ORAL
  Filled 2017-11-03: qty 2

## 2017-11-03 MED ORDER — IBUPROFEN 800 MG PO TABS
800.0000 mg | ORAL_TABLET | Freq: Once | ORAL | Status: AC
  Administered 2017-11-03: 800 mg via ORAL
  Filled 2017-11-03: qty 1

## 2017-11-03 MED ORDER — DEXAMETHASONE 4 MG PO TABS: 4.0000 mg | ORAL_TABLET | Freq: Two times a day (BID) | ORAL | 0 refills | Status: DC

## 2017-11-03 MED ORDER — IBUPROFEN 600 MG PO TABS: 600.0000 mg | ORAL_TABLET | Freq: Four times a day (QID) | ORAL | 0 refills | Status: DC

## 2017-11-03 NOTE — Discharge Instructions (Addendum)
Your vital signs have been reviewed.  Your examination favors a rotator cuff injury of the left shoulder.  Heating pad to the shoulder may be helpful.  Please use ibuprofen with breakfast, lunch, dinner, and at bedtime.  Please use Decadron 2 times daily with food.  Please use your sling until seen by the orthopedic specialist.  Please see Dr Meda Coffee for assistance with pain management. Please call Dr. Aline Brochure for an orthopedic evaluation concerning your shoulder.
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
## 2017-11-06 ENCOUNTER — Ambulatory Visit (HOSPITAL_COMMUNITY): Payer: Medicaid Other | Admitting: Psychiatry

## 2017-11-20 ENCOUNTER — Ambulatory Visit (HOSPITAL_COMMUNITY): Payer: Medicaid Other | Admitting: Psychiatry

## 2017-11-22 ENCOUNTER — Encounter (HOSPITAL_COMMUNITY): Payer: Self-pay | Admitting: Psychiatry

## 2017-11-22 NOTE — Progress Notes (Signed)
Outpatient Therapist Discharge Summary  Joyce [name]    04/12/1976   Admission Date: 03/15/2017 Discharge Date:  11/22/2017 Reason for Discharge:  No contact in 60 days, excessive no shows Diagnosis:  Axis I:  Major Depressive Disorder     Comments:    Alonza Smoker

## 2017-11-28 ENCOUNTER — Ambulatory Visit: Payer: Medicaid Other

## 2017-11-28 ENCOUNTER — Encounter: Payer: Self-pay | Admitting: *Deleted

## 2017-12-01 ENCOUNTER — Ambulatory Visit (INDEPENDENT_AMBULATORY_CARE_PROVIDER_SITE_OTHER): Payer: Medicaid Other | Admitting: Adult Health

## 2017-12-01 ENCOUNTER — Other Ambulatory Visit: Payer: Self-pay

## 2017-12-01 ENCOUNTER — Encounter: Payer: Self-pay | Admitting: Adult Health

## 2017-12-01 ENCOUNTER — Encounter: Payer: Self-pay | Admitting: *Deleted

## 2017-12-01 ENCOUNTER — Other Ambulatory Visit (HOSPITAL_COMMUNITY)
Admission: RE | Admit: 2017-12-01 | Discharge: 2017-12-01 | Disposition: A | Discharge status: Home / Self Care | Payer: Medicaid Other | Source: Ambulatory Visit | Attending: Adult Health | Admitting: Adult Health

## 2017-12-01 VITALS — BP 133/76 | HR 73 | Resp 18 | Ht 65.0 in | Wt 228.0 lb

## 2017-12-01 DIAGNOSIS — Z3009 Encounter for other general counseling and advice on contraception: Secondary | ICD-10-CM | POA: Insufficient documentation

## 2017-12-01 DIAGNOSIS — Z309 Encounter for contraceptive management, unspecified: Secondary | ICD-10-CM | POA: Diagnosis not present

## 2017-12-01 DIAGNOSIS — Z3202 Encounter for pregnancy test, result negative: Secondary | ICD-10-CM | POA: Diagnosis not present

## 2017-12-01 DIAGNOSIS — Z3042 Encounter for surveillance of injectable contraceptive: Secondary | ICD-10-CM

## 2017-12-01 DIAGNOSIS — Z1212 Encounter for screening for malignant neoplasm of rectum: Secondary | ICD-10-CM | POA: Diagnosis not present

## 2017-12-01 DIAGNOSIS — Z01419 Encounter for gynecological examination (general) (routine) without abnormal findings: Secondary | ICD-10-CM

## 2017-12-01 DIAGNOSIS — Z1211 Encounter for screening for malignant neoplasm of colon: Secondary | ICD-10-CM

## 2017-12-01 DIAGNOSIS — Z113 Encounter for screening for infections with a predominantly sexual mode of transmission: Secondary | ICD-10-CM

## 2017-12-01 LAB — HM PAP SMEAR: HM Pap smear: NEGATIVE

## 2017-12-01 LAB — HEMOCCULT GUIAC POC 1CARD (OFFICE): Fecal Occult Blood, POC: NEGATIVE

## 2017-12-01 LAB — RESULTS CONSOLE HPV: CHL HPV: NEGATIVE

## 2017-12-01 LAB — POCT URINE PREGNANCY: PREG TEST UR: NEGATIVE

## 2017-12-01 MED ORDER — MEDROXYPROGESTERONE ACETATE 150 MG/ML IM SUSP
150.0000 mg | Freq: Once | INTRAMUSCULAR | Status: AC
  Administered 2017-12-01: 150 mg via INTRAMUSCULAR

## 2017-12-01 MED ORDER — MEDROXYPROGESTERONE ACETATE 150 MG/ML IM SUSP: 150.0000 mg | INTRAMUSCULAR | 4 refills | Status: DC

## 2017-12-01 NOTE — Progress Notes (Signed)
Pt given DepoProvera 150mg IM left deltoid without complications. Advised to return in 12 weeks for next injection. 

## 2017-12-01 NOTE — Progress Notes (Signed)
Patient ID: Joyce [name], female   DOB: 12/05/1975, 42 y.o.   MRN: [id number] History of Present Illness: Joyce [name] is a 42 year old black female, G11P8 in for a well woman gyn exam and pap and to get depo, she has family planning medicaid. PCP was Dr Meda Coffee.    Current Medications, Allergies, Past Medical History, Past Surgical History, Family History and Social History were reviewed in [employer] record.     Review of Systems: Patient denies any headaches, hearing loss, fatigue, blurred vision, shortness of breath, chest pain, abdominal pain, problems with bowel movements, urination, or intercourse. No joint pain or mood swings.    Physical Exam:BP 133/76 (BP Location: Right Arm, Patient Position: Sitting, Cuff Size: Normal)   Pulse 73   Resp 18   Ht 5\' 5"  (1.651 m)   Wt 228 lb (103.4 kg)   BMI 37.94 kg/m UPT negative, and she received Depo today.  General:  Well developed, well nourished, no acute distress Skin:  Warm and dry Neck:  Midline trachea, normal thyroid, good ROM, no lymphadenopathy Lungs; Clear to auscultation bilaterally Breast:  No dominant palpable mass, retraction, or nipple discharge Cardiovascular: Regular rate and rhythm Abdomen:  Soft, non tender, no hepatosplenomegaly Pelvic:  External genitalia is normal in appearance, no lesions.  The vagina is normal in appearance. Urethra has no lesions or masses. The cervix is bulbous. Pap with HPV and GC/CHL performed.  Uterus is felt to be normal size, shape, and contour.  No adnexal masses or tenderness noted.Bladder is non tender, no masses felt. Rectal: Good sphincter tone, no polyps, or hemorrhoids felt.  Hemoccult negative. Extremities/musculoskeletal:  No swelling or varicosities noted, no clubbing or cyanosis Psych:  No mood changes, alert and cooperative,seems happy PHQ 9 score is 9, she is on meds and sees someone.   Impression: 1. Encounter for gynecological examination with  Papanicolaou smear of cervix   2. Family planning   3. Surveillance for Depo-Provera contraception   4. Pregnancy examination or test, negative result   5. Screening examination for STD (sexually transmitted disease)   6. Screening for colorectal cancer       Plan: Check HIV and RPR Meds ordered this encounter  Medications  . medroxyPROGESTERone (DEPO-PROVERA) injection 150 mg  . medroxyPROGESTERone (DEPO-PROVERA) 150 MG/ML injection    Sig: Inject 1 mL (150 mg total) into the muscle every 3 (three) months.    Dispense:  1 mL    Refill:  4    Order Specific Question:   Supervising Provider    Answer:   Florian Buff [[id number]]   Physical in 1 year Pap in 3 if normal Get mammogram now and yearly

## 2017-12-02 LAB — HIV ANTIBODY (ROUTINE TESTING W REFLEX): HIV SCREEN 4TH GENERATION: NONREACTIVE

## 2017-12-02 LAB — RPR: RPR: NONREACTIVE

## 2017-12-04 ENCOUNTER — Ambulatory Visit (HOSPITAL_COMMUNITY): Payer: Medicaid Other | Admitting: Psychiatry

## 2017-12-05 LAB — CYTOLOGY - PAP
Adequacy: ABSENT
Chlamydia: NEGATIVE
Diagnosis: NEGATIVE
HPV: NOT DETECTED
NEISSERIA GONORRHEA: NEGATIVE

## 2017-12-18 ENCOUNTER — Ambulatory Visit (HOSPITAL_COMMUNITY): Payer: Self-pay | Admitting: Psychiatry

## 2018-01-01 ENCOUNTER — Ambulatory Visit (HOSPITAL_COMMUNITY): Payer: Self-pay | Admitting: Psychiatry

## 2018-02-21 ENCOUNTER — Encounter: Payer: Self-pay | Admitting: *Deleted

## 2018-02-21 ENCOUNTER — Other Ambulatory Visit: Payer: Self-pay

## 2018-02-21 ENCOUNTER — Ambulatory Visit (INDEPENDENT_AMBULATORY_CARE_PROVIDER_SITE_OTHER): Payer: Medicaid Other | Admitting: *Deleted

## 2018-02-21 DIAGNOSIS — Z3202 Encounter for pregnancy test, result negative: Secondary | ICD-10-CM

## 2018-02-21 DIAGNOSIS — Z3042 Encounter for surveillance of injectable contraceptive: Secondary | ICD-10-CM | POA: Diagnosis not present

## 2018-02-21 LAB — POCT URINE PREGNANCY: Preg Test, Ur: NEGATIVE

## 2018-02-21 MED ORDER — MEDROXYPROGESTERONE ACETATE 150 MG/ML IM SUSP
150.0000 mg | Freq: Once | INTRAMUSCULAR | Status: AC
  Administered 2018-02-21: 150 mg via INTRAMUSCULAR

## 2018-02-21 NOTE — Progress Notes (Signed)
Pt given DepoProvera 150mg IM right deltoid without complications. Advised to return in 12 weeks for next injection. 

## 2018-02-22 ENCOUNTER — Ambulatory Visit: Payer: Medicaid Other

## 2018-05-16 ENCOUNTER — Ambulatory Visit (INDEPENDENT_AMBULATORY_CARE_PROVIDER_SITE_OTHER): Payer: Medicaid Other

## 2018-05-16 VITALS — Ht 65.0 in | Wt 228.0 lb

## 2018-05-16 DIAGNOSIS — Z3042 Encounter for surveillance of injectable contraceptive: Secondary | ICD-10-CM

## 2018-05-16 DIAGNOSIS — Z3202 Encounter for pregnancy test, result negative: Secondary | ICD-10-CM

## 2018-05-16 LAB — POCT URINE PREGNANCY: Preg Test, Ur: NEGATIVE

## 2018-05-16 MED ORDER — MEDROXYPROGESTERONE ACETATE 150 MG/ML IM SUSP
150.0000 mg | Freq: Once | INTRAMUSCULAR | Status: AC
  Administered 2018-05-16: 150 mg via INTRAMUSCULAR

## 2018-05-16 NOTE — Progress Notes (Signed)
Pt here for depo injection 150 mg IM given lt deltoid. Tolerated well.Return 12 weeks for next injection. Pad CMA 

## 2018-07-15 ENCOUNTER — Encounter (HOSPITAL_COMMUNITY): Payer: Self-pay | Admitting: Emergency Medicine

## 2018-07-15 ENCOUNTER — Other Ambulatory Visit: Payer: Self-pay

## 2018-07-15 ENCOUNTER — Emergency Department (HOSPITAL_COMMUNITY)
Admission: EM | Admit: 2018-07-15 | Discharge: 2018-07-15 | Disposition: A | Discharge status: Home / Self Care | Payer: Medicaid Other | Attending: Emergency Medicine | Admitting: Emergency Medicine

## 2018-07-15 DIAGNOSIS — R1013 Epigastric pain: Secondary | ICD-10-CM | POA: Insufficient documentation

## 2018-07-15 DIAGNOSIS — R197 Diarrhea, unspecified: Secondary | ICD-10-CM | POA: Insufficient documentation

## 2018-07-15 DIAGNOSIS — Z79899 Other long term (current) drug therapy: Secondary | ICD-10-CM | POA: Insufficient documentation

## 2018-07-15 DIAGNOSIS — R109 Unspecified abdominal pain: Secondary | ICD-10-CM

## 2018-07-15 DIAGNOSIS — I10 Essential (primary) hypertension: Secondary | ICD-10-CM | POA: Insufficient documentation

## 2018-07-15 LAB — URINALYSIS, ROUTINE W REFLEX MICROSCOPIC
Bilirubin Urine: NEGATIVE
Glucose, UA: NEGATIVE mg/dL
Ketones, ur: NEGATIVE mg/dL
NITRITE: NEGATIVE
Protein, ur: 100 mg/dL — AB
Specific Gravity, Urine: 1.008 (ref 1.005–1.030)
pH: 6 (ref 5.0–8.0)

## 2018-07-15 LAB — COMPREHENSIVE METABOLIC PANEL
ALBUMIN: 3 g/dL — AB (ref 3.5–5.0)
ALT: 16 U/L (ref 0–44)
AST: 16 U/L (ref 15–41)
Alkaline Phosphatase: 42 U/L (ref 38–126)
Anion gap: 6 (ref 5–15)
BUN: 6 mg/dL (ref 6–20)
CHLORIDE: 110 mmol/L (ref 98–111)
CO2: 24 mmol/L (ref 22–32)
Calcium: 8.5 mg/dL — ABNORMAL LOW (ref 8.9–10.3)
Creatinine, Ser: 0.53 mg/dL (ref 0.44–1.00)
GFR calc Af Amer: >60 mL/min (ref >60)
Glucose, Bld: 100 mg/dL — ABNORMAL HIGH (ref 70–99)
Potassium: 3.7 mmol/L (ref 3.5–5.1)
Sodium: 140 mmol/L (ref 135–145)
Total Bilirubin: 0.2 mg/dL — ABNORMAL LOW (ref 0.3–1.2)
Total Protein: 6.7 g/dL (ref 6.5–8.1)

## 2018-07-15 LAB — CBC
HCT: 47.7 % — ABNORMAL HIGH (ref 36.0–46.0)
Hemoglobin: 14.8 g/dL (ref 12.0–15.0)
MCH: 25.5 pg — ABNORMAL LOW (ref 26.0–34.0)
MCHC: 31 g/dL (ref 30.0–36.0)
MCV: 82.2 fL (ref 80.0–100.0)
Platelets: 279 10*3/uL (ref 150–400)
RBC: 5.8 MIL/uL — ABNORMAL HIGH (ref 3.87–5.11)
RDW: 14.5 % (ref 11.5–15.5)
WBC: 8.3 10*3/uL (ref 4.0–10.5)
nRBC: 0 % (ref 0.0–0.2)

## 2018-07-15 LAB — I-STAT BETA HCG BLOOD, ED (MC, WL, AP ONLY): I-stat hCG, quantitative: <5 m[IU]/mL (ref <5)

## 2018-07-15 LAB — LIPASE, BLOOD: Lipase: 23 U/L (ref 11–51)

## 2018-07-15 MED ORDER — LACTATED RINGERS IV BOLUS
1000.0000 mL | Freq: Once | INTRAVENOUS | Status: AC
  Administered 2018-07-15: 1000 mL via INTRAVENOUS

## 2018-07-15 MED ORDER — PROMETHAZINE HCL 25 MG/ML IJ SOLN
12.5000 mg | Freq: Once | INTRAMUSCULAR | Status: AC
  Administered 2018-07-15: 12.5 mg via INTRAVENOUS
  Filled 2018-07-15: qty 1

## 2018-07-15 MED ORDER — PROMETHAZINE HCL 25 MG PO TABS: 25.0000 mg | ORAL_TABLET | Freq: Four times a day (QID) | ORAL | 0 refills | Status: DC | PRN

## 2018-07-15 MED ORDER — SODIUM CHLORIDE 0.9% FLUSH
3.0000 mL | Freq: Once | INTRAVENOUS | Status: AC
  Administered 2018-07-15: 3 mL via INTRAVENOUS

## 2018-07-15 MED ORDER — FENTANYL CITRATE (PF) 100 MCG/2ML IJ SOLN
50.0000 ug | Freq: Once | INTRAMUSCULAR | Status: AC
  Administered 2018-07-15: 50 ug via INTRAVENOUS
  Filled 2018-07-15: qty 2

## 2018-07-15 NOTE — ED Triage Notes (Signed)
Pt c/o of upper abdominal pain with n/d since Friday.

## 2018-07-16 NOTE — ED Provider Notes (Signed)
[facility] EMERGENCY DEPARTMENT Provider Note   CSN: [id number] Arrival date & time: 07/15/18  1519     History   Chief Complaint Chief Complaint  Patient presents with  . Abdominal Pain    HPI Joyce [name] is a 43 y.o. female.  The history is provided by the patient.  Abdominal Pain  Pain location:  Epigastric Pain quality: aching, bloating and fullness   Pain radiates to:  Does not radiate Pain severity:  Mild Onset quality:  Gradual Duration:  3 days Timing:  Constant Chronicity:  New Context: sick contacts   Context: not alcohol use, not diet changes, not eating and not previous surgeries     Past Medical History:  Diagnosis Date  . AMA (advanced maternal age) multigravida 35+ 11/12/2014  . Anemia   . Depression   . Hypertension   . Migraine   . Pregnant 11/12/2014  . PTSD (post-traumatic stress disorder) 05/01/2017  . Round ligament pain 11/12/2014  . Short of breath on exertion 11/12/2014  . Trichomonas infection     Patient Active Problem List   Diagnosis Date Noted  . MDD (major depressive disorder), recurrent episode, moderate ([place]) 05/01/2017  . PTSD (post-traumatic stress disorder) 05/01/2017  . Depression, recurrent ([place]) 12/07/2016  . Essential hypertension 07/20/2016  . Uterine fibroid 01/27/2016  . 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 [place] multiparity 11/26/2014    Past Surgical History:  Procedure Laterality Date  . CHOLECYSTECTOMY       OB History    Gravida  11   Para  8   Term  8   Preterm  0   AB  3   Living  8     SAB  1   TAB  2   Ectopic      Multiple  0   Live Births  8            Home Medications    Prior to Admission medications   Medication Sig Start Date End Date Taking? Authorizing Provider  dexamethasone (DECADRON) 4 MG tablet Take 1 tablet (4 mg total) by mouth 2 (two) times daily with a meal. Patient taking differently: Take 4 mg by mouth daily.   11/03/17  Yes Lily Kocher, PA-C  escitalopram (LEXAPRO) 20 MG tablet Take 1 tablet (20 mg total) by mouth daily. 10/11/17  Yes Norman Clay, MD  ibuprofen (ADVIL,MOTRIN) 200 MG tablet Take 200 mg by mouth every 6 (six) hours as needed for mild pain or moderate pain.   Yes [provider]  lisinopril-hydrochlorothiazide (PRINZIDE,ZESTORETIC) 20-25 MG tablet take one tablet by mouth once daily 04/04/17  Yes [provider]  medroxyPROGESTERone (DEPO-PROVERA) 150 MG/ML injection Inject 1 mL (150 mg total) into the muscle every 3 (three) months. 12/01/17  Yes Estill Dooms, NP  Prenatal Vit-Fe Fumarate-FA (PREPLUS) 27-1 MG TABS TAKE 1 TABLET BY MOUTH DAILY AT 12 NOON Patient taking differently: Take 1 tablet by mouth daily.  04/04/17  Yes Derrek Monaco A, NP  QUEtiapine (SEROQUEL) 25 MG tablet Take 1 tablet (25 mg total) by mouth at bedtime. 10/11/17  Yes Hisada, Elie Goody, MD  SUMAtriptan (IMITREX) 50 MG tablet Take 1 tablet (50 mg total) by mouth once. May repeat in 2 hours if headache persists or recurs. 02/15/17 07/15/18 Yes Raylene Everts, MD  traZODone (DESYREL) 50 MG tablet Take 1 tablet (50 mg total) by mouth at bedtime as needed for sleep. Patient taking differently: Take 50  mg by mouth at bedtime.  10/11/17  Yes Hisada, Elie Goody, MD  amLODipine (NORVASC) 5 MG tablet TAKE 1 TABLET(5 MG) BY MOUTH DAILY Patient taking differently: Take 5 mg by mouth daily.  07/25/17   Cresenzo-Dishmon, Joaquim Lai, CNM  promethazine (PHENERGAN) 25 MG tablet Take 1 tablet (25 mg total) by mouth every 6 (six) hours as needed for nausea or vomiting. 07/15/18   Allin Frix, Corene Cornea, MD    Family History Family History  Problem Relation Age of Onset  . Diabetes Mother   . Hypertension Mother   . Kidney disease Mother   . Asthma Mother   . Hyperlipidemia Mother   . Alzheimer's disease Father   . Diabetes Father   . Kidney disease Sister   . Asthma Son   . Diabetes Sister   . Asthma Son   . ADD / ADHD  Son   . Heart murmur Son   . Asthma Son     Social History Social History   Tobacco Use  . Smoking status: Never Smoker  . Smokeless tobacco: Never Used  Substance Use Topics  . Alcohol use: Yes    Comment: occasionally  . Drug use: No     Allergies   Patient has no known allergies.   Review of Systems Review of Systems  Gastrointestinal: Positive for abdominal pain.  All other systems reviewed and are negative.    Physical Exam Updated Vital Signs BP 135/79   Pulse 84   Temp 98.7 F (37.1 C) (Oral)   Resp 16   Ht 5\' 5"  (1.651 m)   Wt 103.4 kg   SpO2 100%   BMI 37.94 kg/m   Physical Exam Vitals signs and nursing note reviewed.  Constitutional:      Appearance: She is well-developed.  HENT:     Head: Normocephalic and atraumatic.     Mouth/Throat:     Mouth: Mucous membranes are dry.     Pharynx: Oropharynx is clear.  Eyes:     Extraocular Movements: Extraocular movements intact.     Conjunctiva/sclera: Conjunctivae normal.  Neck:     Musculoskeletal: Normal range of motion.  Cardiovascular:     Rate and Rhythm: Normal rate and regular rhythm.  Pulmonary:     Effort: Pulmonary effort is normal. No respiratory distress.     Breath sounds: No stridor.  Abdominal:     General: Abdomen is flat. Bowel sounds are normal. There is no distension.     Palpations: Abdomen is soft. There is no shifting dullness or fluid wave.     Tenderness: There is no abdominal tenderness. There is no right CVA tenderness, left CVA tenderness or rebound.  Musculoskeletal: Normal range of motion.        General: No swelling or tenderness.  Skin:    General: Skin is warm and dry.  Neurological:     General: No focal deficit present.     Mental Status: She is alert.      ED Treatments / Results  Labs (all labs ordered are listed, but only abnormal results are displayed) Labs Reviewed  COMPREHENSIVE METABOLIC PANEL - Abnormal; Notable for the following components:       Result Value   Glucose, Bld 100 (*)    Calcium 8.5 (*)    Albumin 3.0 (*)    Total Bilirubin 0.2 (*)    All other components within normal limits  CBC - Abnormal; Notable for the following components:   RBC 5.80 (*)  HCT 47.7 (*)    MCH 25.5 (*)    All other components within normal limits  URINALYSIS, ROUTINE W REFLEX MICROSCOPIC - Abnormal; Notable for the following components:   APPearance HAZY (*)    Hgb urine dipstick MODERATE (*)    Protein, ur 100 (*)    Leukocytes,Ua SMALL (*)    Bacteria, UA MANY (*)    All other components within normal limits  URINE CULTURE  LIPASE, BLOOD  I-STAT BETA HCG BLOOD, ED ([facility], [facility], [facility] ONLY)    EKG None  Radiology No results found.  Procedures Procedures (including critical care time)  Medications Ordered in ED Medications  sodium chloride flush (NS) 0.9 % injection 3 mL (3 mLs Intravenous Given 07/15/18 2007)  lactated ringers bolus 1,000 mL (0 mLs Intravenous Stopped 07/15/18 2212)  fentaNYL (SUBLIMAZE) injection 50 mcg (50 mcg Intravenous Given 07/15/18 2001)  promethazine (PHENERGAN) injection 12.5 mg (12.5 mg Intravenous Given 07/15/18 2007)     Initial Impression / Assessment and Plan / ED Course  I have reviewed the triage vital signs and the nursing notes.  Pertinent labs & imaging results that were available during my care of the patient were reviewed by me and considered in my medical decision making (see chart for details).     Suspect GI illness. Doubt serious bacterial infection. Tolerating PO, improved symptoms with treatmetns in ER. Stable for dc on same. Abdomen benign at time of discharge.  Final Clinical Impressions(s) / ED Diagnoses   Final diagnoses:  Abdominal pain, unspecified abdominal location  Diarrhea, unspecified type    ED Discharge Orders         Ordered    promethazine (PHENERGAN) 25 MG tablet  Every 6 hours PRN     07/15/18 2247           Lilyanna Lunt, Corene Cornea, MD 07/19/18 7026

## 2018-07-17 LAB — URINE CULTURE

## 2018-07-30 NOTE — Telephone Encounter (Signed)
Provider notified

## 2018-08-08 ENCOUNTER — Ambulatory Visit (INDEPENDENT_AMBULATORY_CARE_PROVIDER_SITE_OTHER): Payer: Medicaid Other

## 2018-08-08 ENCOUNTER — Other Ambulatory Visit: Payer: Self-pay

## 2018-08-08 VITALS — Ht 65.0 in | Wt 227.0 lb

## 2018-08-08 DIAGNOSIS — Z3202 Encounter for pregnancy test, result negative: Secondary | ICD-10-CM

## 2018-08-08 DIAGNOSIS — Z3042 Encounter for surveillance of injectable contraceptive: Secondary | ICD-10-CM | POA: Diagnosis not present

## 2018-08-08 LAB — POCT URINE PREGNANCY: Preg Test, Ur: NEGATIVE

## 2018-08-08 MED ORDER — MEDROXYPROGESTERONE ACETATE 150 MG/ML IM SUSP
150.0000 mg | Freq: Once | INTRAMUSCULAR | Status: AC
  Administered 2018-08-08: 150 mg via INTRAMUSCULAR

## 2018-08-08 NOTE — Progress Notes (Signed)
Pt here for depo injection 150 mg IM given rt deltoid. Tolerated well. Return 12 weeks for next injection. Pad CMA 

## 2018-08-17 ENCOUNTER — Encounter (HOSPITAL_COMMUNITY): Payer: Self-pay

## 2018-08-17 ENCOUNTER — Other Ambulatory Visit: Payer: Self-pay

## 2018-08-17 ENCOUNTER — Emergency Department (HOSPITAL_COMMUNITY)
Admission: EM | Admit: 2018-08-17 | Discharge: 2018-08-17 | Disposition: A | Discharge status: Home / Self Care | Payer: Self-pay | Attending: Emergency Medicine | Admitting: Emergency Medicine

## 2018-08-17 ENCOUNTER — Emergency Department (HOSPITAL_COMMUNITY): Payer: Self-pay

## 2018-08-17 DIAGNOSIS — I10 Essential (primary) hypertension: Secondary | ICD-10-CM | POA: Insufficient documentation

## 2018-08-17 DIAGNOSIS — Z79899 Other long term (current) drug therapy: Secondary | ICD-10-CM | POA: Insufficient documentation

## 2018-08-17 DIAGNOSIS — F419 Anxiety disorder, unspecified: Secondary | ICD-10-CM | POA: Insufficient documentation

## 2018-08-17 LAB — COMPREHENSIVE METABOLIC PANEL
ALT: 17 U/L (ref 0–44)
AST: 19 U/L (ref 15–41)
Albumin: 3 g/dL — ABNORMAL LOW (ref 3.5–5.0)
Alkaline Phosphatase: 49 U/L (ref 38–126)
Anion gap: 8 (ref 5–15)
BUN: 8 mg/dL (ref 6–20)
CO2: 23 mmol/L (ref 22–32)
Calcium: 8.9 mg/dL (ref 8.9–10.3)
Chloride: 107 mmol/L (ref 98–111)
Creatinine, Ser: 0.81 mg/dL (ref 0.44–1.00)
GFR calc Af Amer: >60 mL/min (ref >60)
GFR calc non Af Amer: >60 mL/min (ref >60)
GLUCOSE: 117 mg/dL — AB (ref 70–99)
Potassium: 3.1 mmol/L — ABNORMAL LOW (ref 3.5–5.1)
Sodium: 138 mmol/L (ref 135–145)
Total Bilirubin: 0.3 mg/dL (ref 0.3–1.2)
Total Protein: 6.8 g/dL (ref 6.5–8.1)

## 2018-08-17 LAB — CBC WITH DIFFERENTIAL/PLATELET
Abs Immature Granulocytes: 0.07 10*3/uL (ref 0.00–0.07)
BASOS PCT: 0 %
Basophils Absolute: 0.1 10*3/uL (ref 0.0–0.1)
Eosinophils Absolute: 0.1 10*3/uL (ref 0.0–0.5)
Eosinophils Relative: 1 %
HCT: 45.9 % (ref 36.0–46.0)
Hemoglobin: 14.8 g/dL (ref 12.0–15.0)
Immature Granulocytes: 1 %
Lymphocytes Relative: 14 %
Lymphs Abs: 1.6 10*3/uL (ref 0.7–4.0)
MCH: 26.5 pg (ref 26.0–34.0)
MCHC: 32.2 g/dL (ref 30.0–36.0)
MCV: 82.1 fL (ref 80.0–100.0)
Monocytes Absolute: 0.5 10*3/uL (ref 0.1–1.0)
Monocytes Relative: 4 %
Neutro Abs: 9.3 10*3/uL — ABNORMAL HIGH (ref 1.7–7.7)
Neutrophils Relative %: 80 %
PLATELETS: 278 10*3/uL (ref 150–400)
RBC: 5.59 MIL/uL — ABNORMAL HIGH (ref 3.87–5.11)
RDW: 14.7 % (ref 11.5–15.5)
WBC: 11.6 10*3/uL — ABNORMAL HIGH (ref 4.0–10.5)
nRBC: 0 % (ref 0.0–0.2)

## 2018-08-17 IMAGING — CR CHEST - 2 VIEW
2 series · 2 of 2 positions shown · non-contrast
Comparison: [DATE]

CLINICAL DATA: Weakness, shortness of breath

EXAM:
CHEST - 2 VIEW

[x chest ap]
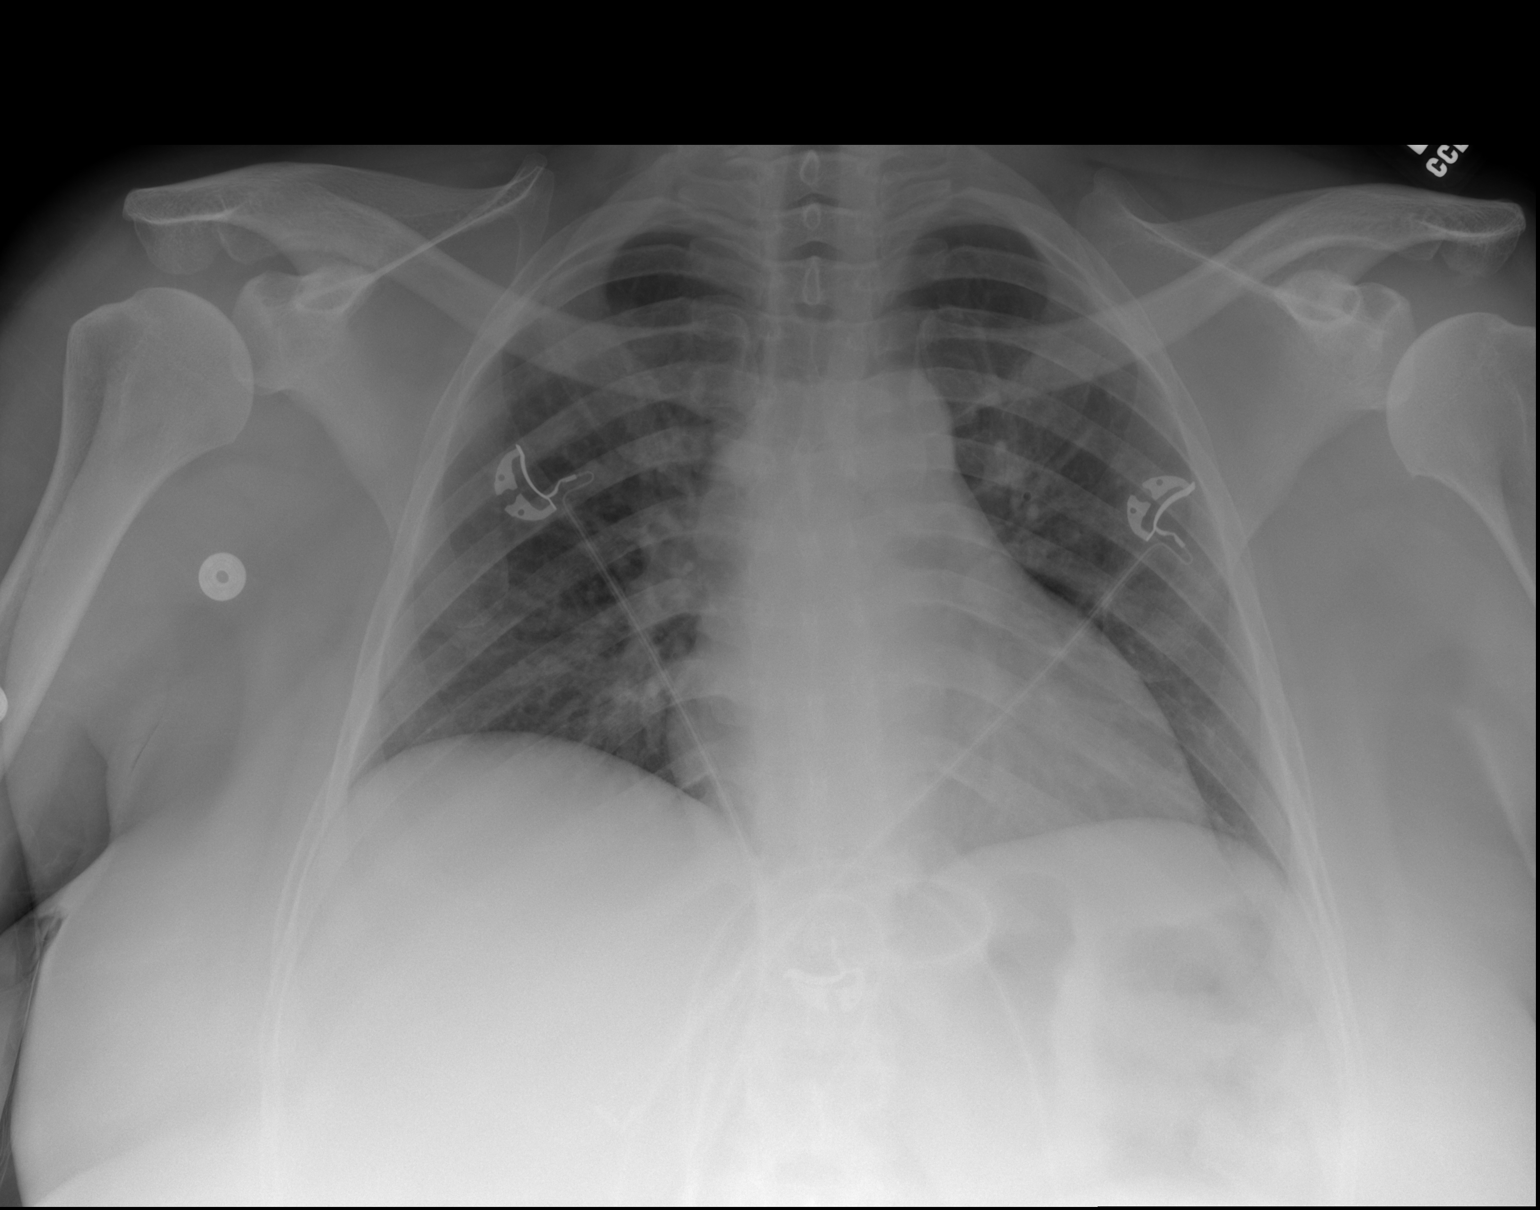

[w chest lat]
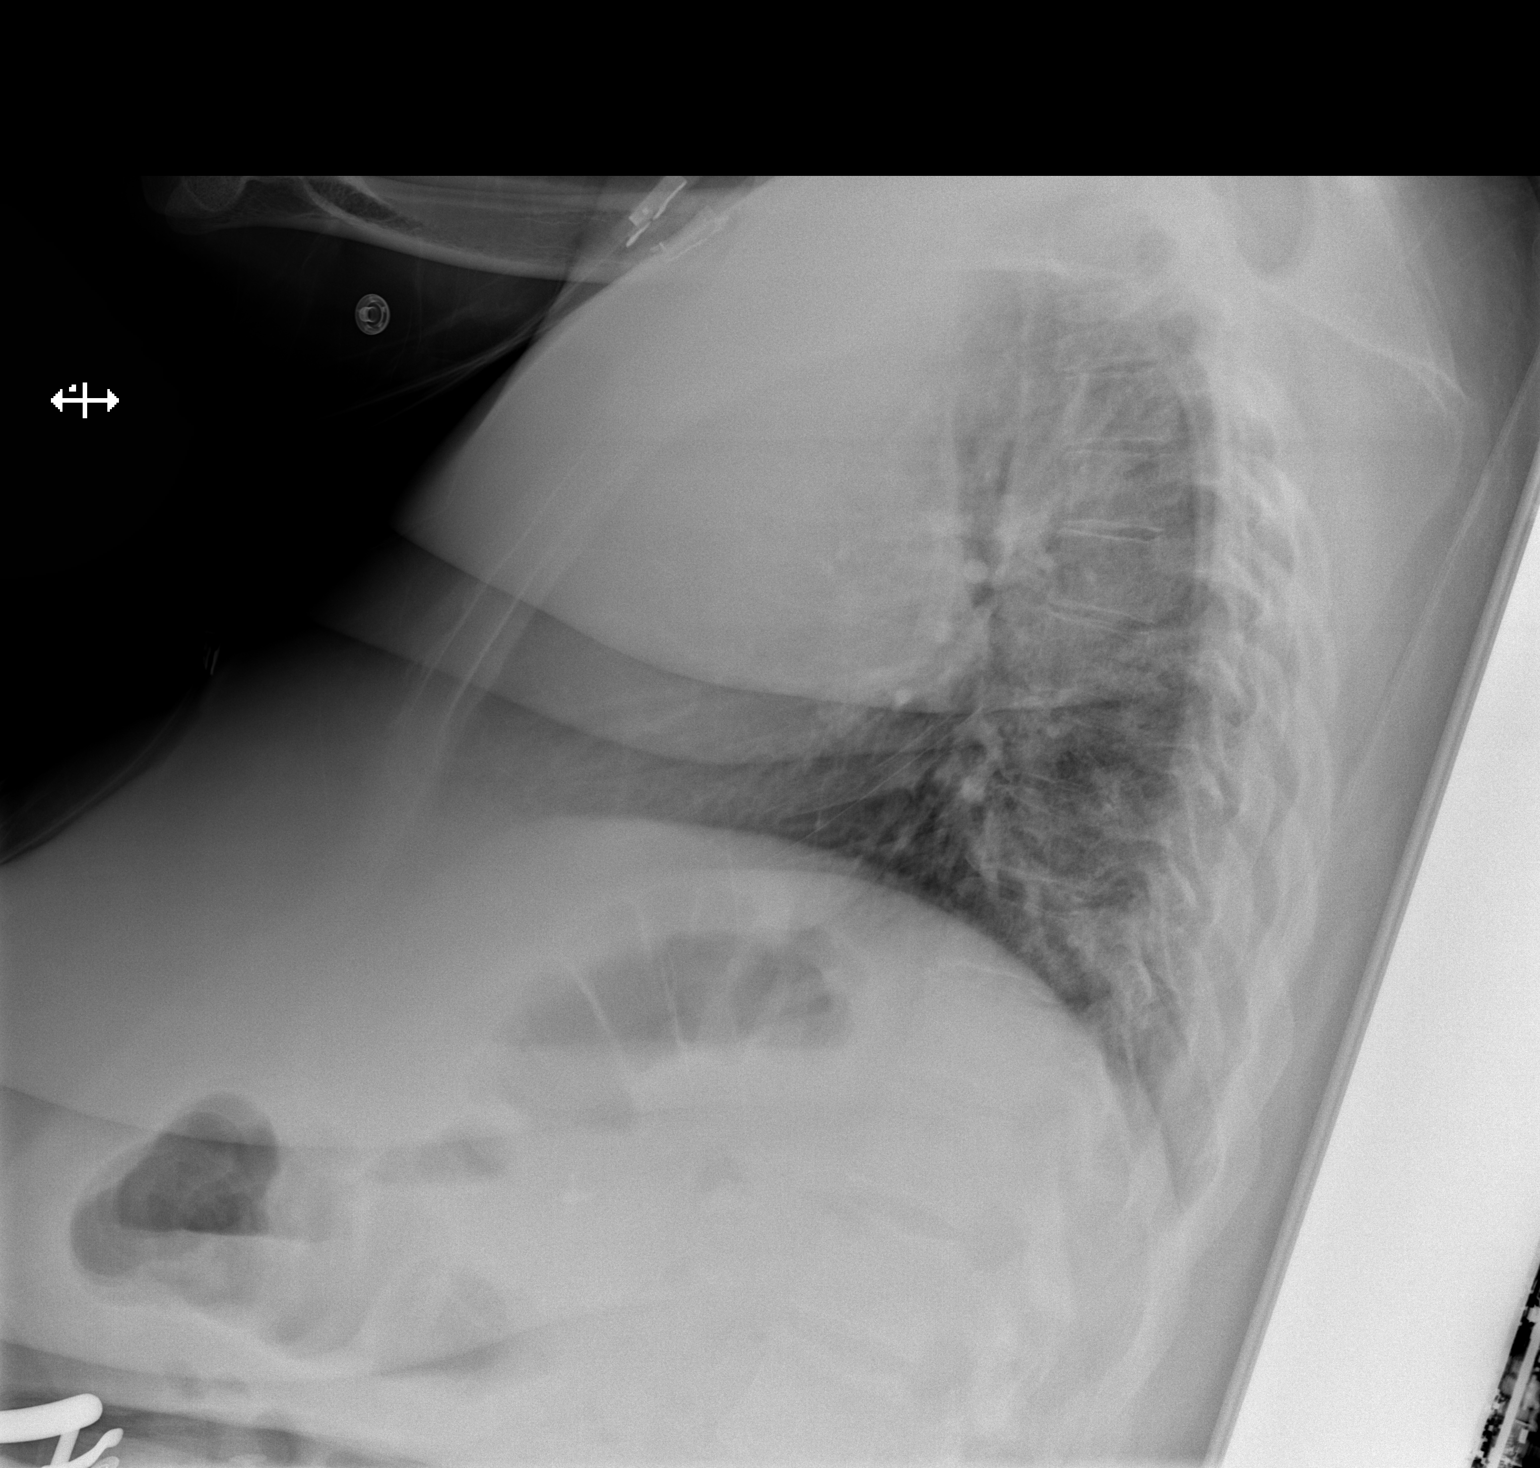

[2 of 2 positions shown; findings below may reference images not displayed]

FINDINGS: Low volume examination with mild cardiomegaly. No acute abnormality
of the lungs. No abnormality of the osseous structures.
IMPRESSION: Low volume examination with mild cardiomegaly. No acute abnormality
of the lungs.

## 2018-08-17 MED ORDER — POTASSIUM CHLORIDE CRYS ER 20 MEQ PO TBCR
40.0000 meq | EXTENDED_RELEASE_TABLET | Freq: Once | ORAL | Status: AC
  Administered 2018-08-17: 40 meq via ORAL
  Filled 2018-08-17: qty 2

## 2018-08-17 MED ORDER — LORAZEPAM 1 MG PO TABS
1.0000 mg | ORAL_TABLET | Freq: Once | ORAL | Status: AC
  Administered 2018-08-17: 1 mg via ORAL
  Filled 2018-08-17: qty 1

## 2018-08-17 MED ORDER — HYDROXYZINE HCL 25 MG PO TABS: 25.0000 mg | ORAL_TABLET | Freq: Four times a day (QID) | ORAL | 1 refills | Status: DC | PRN

## 2018-08-17 NOTE — Discharge Instructions (Signed)
Rest at home tomorrow.  Call your doctor next week and if you are still experiencing symptoms you should follow-up with them next week

## 2018-08-17 NOTE — ED Notes (Signed)
Bed: WA02 Expected date:  Expected time:  Means of arrival:  Comments: EMS Med Reaction

## 2018-08-17 NOTE — ED Triage Notes (Signed)
Patient presents with dizziness/shakey/nervous via EMS. Patient states she got to work this morning and started to feel dizzy/faint/short of breath. Patient reports it "feels like anxiety." Patient states she started a new blood pressure medication this morning starting "with an L." Patient unsure of the name. Patient VSS for EMS. No orthostatics. Patient denies history of anxiety.

## 2018-08-17 NOTE — ED Provider Notes (Signed)
[facility] DEPT Provider Note   CSN: [id number] Arrival date & time: 08/17/18  0901    History   Chief Complaint Chief Complaint  Patient presents with  . Medication Reaction    HPI Joyce [name] is a 43 y.o. female.     Patient was brought into the emergency department because she became very anxious at work was shaking having hard time getting words out.  The history is provided by the patient and a relative. No language interpreter was used.  Altered Mental Status  Presenting symptoms: behavior changes   Severity:  Moderate Most recent episode:  Today Episode history:  Single Timing:  Constant Progression:  Improving Chronicity:  New Context: not alcohol use   Associated symptoms: agitation   Associated symptoms: no abdominal pain, no hallucinations, no headaches, no rash and no seizures     Past Medical History:  Diagnosis Date  . AMA (advanced maternal age) multigravida 35+ 11/12/2014  . Anemia   . Depression   . Hypertension   . Migraine   . Pregnant 11/12/2014  . PTSD (post-traumatic stress disorder) 05/01/2017  . Round ligament pain 11/12/2014  . Short of breath on exertion 11/12/2014  . Trichomonas infection     Patient Active Problem List   Diagnosis Date Noted  . MDD (major depressive disorder), recurrent episode, moderate ([place]) 05/01/2017  . PTSD (post-traumatic stress disorder) 05/01/2017  . Depression, recurrent ([place]) 12/07/2016  . Essential hypertension 07/20/2016  . Uterine fibroid 01/27/2016  . 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 [place] multiparity 11/26/2014    Past Surgical History:  Procedure Laterality Date  . CHOLECYSTECTOMY       OB History    Gravida  11   Para  8   Term  8   Preterm  0   AB  3   Living  8     SAB  1   TAB  2   Ectopic      Multiple  0   Live Births  8            Home Medications    Prior to Admission medications    Medication Sig Start Date End Date Taking? Authorizing Provider  amLODipine (NORVASC) 5 MG tablet TAKE 1 TABLET(5 MG) BY MOUTH DAILY Patient taking differently: Take 5 mg by mouth daily.  07/25/17   Cresenzo-Dishmon, Joaquim Lai, CNM  dexamethasone (DECADRON) 4 MG tablet Take 1 tablet (4 mg total) by mouth 2 (two) times daily with a meal. Patient taking differently: Take 4 mg by mouth daily.  11/03/17   Lily Kocher, PA-C  escitalopram (LEXAPRO) 20 MG tablet Take 1 tablet (20 mg total) by mouth daily. 10/11/17   Norman Clay, MD  hydrOXYzine (ATARAX/VISTARIL) 25 MG tablet Take 1 tablet (25 mg total) by mouth every 6 (six) hours as needed for anxiety. 08/17/18   Milton Ferguson, MD  ibuprofen (ADVIL,MOTRIN) 200 MG tablet Take 200 mg by mouth every 6 (six) hours as needed for mild pain or moderate pain.    [provider]  lisinopril-hydrochlorothiazide (PRINZIDE,ZESTORETIC) 20-25 MG tablet take one tablet by mouth once daily 04/04/17   [provider]  medroxyPROGESTERone (DEPO-PROVERA) 150 MG/ML injection Inject 1 mL (150 mg total) into the muscle every 3 (three) months. 12/01/17   Estill Dooms, NP  Prenatal Vit-Fe Fumarate-FA (PREPLUS) 27-1 MG TABS TAKE 1 TABLET BY MOUTH DAILY AT 12 NOON Patient taking differently: Take 1 tablet by  mouth daily.  04/04/17   Estill Dooms, NP  promethazine (PHENERGAN) 25 MG tablet Take 1 tablet (25 mg total) by mouth every 6 (six) hours as needed for nausea or vomiting. 07/15/18   Mesner, Corene Cornea, MD  QUEtiapine (SEROQUEL) 25 MG tablet Take 1 tablet (25 mg total) by mouth at bedtime. 10/11/17   Norman Clay, MD  SUMAtriptan (IMITREX) 50 MG tablet Take 1 tablet (50 mg total) by mouth once. May repeat in 2 hours if headache persists or recurs. 02/15/17 07/15/18  Raylene Everts, MD  traZODone (DESYREL) 50 MG tablet Take 1 tablet (50 mg total) by mouth at bedtime as needed for sleep. Patient taking differently: Take 50 mg by mouth at bedtime.   10/11/17   Norman Clay, MD    Family History Family History  Problem Relation Age of Onset  . Diabetes Mother   . Hypertension Mother   . Kidney disease Mother   . Asthma Mother   . Hyperlipidemia Mother   . Alzheimer's disease Father   . Diabetes Father   . Kidney disease Sister   . Asthma Son   . Diabetes Sister   . Asthma Son   . ADD / ADHD Son   . Heart murmur Son   . Asthma Son     Social History Social History   Tobacco Use  . Smoking status: Never Smoker  . Smokeless tobacco: Never Used  Substance Use Topics  . Alcohol use: Yes    Comment: occasionally  . Drug use: No     Allergies   Patient has no known allergies.   Review of Systems Review of Systems  Constitutional: Negative for appetite change and fatigue.  HENT: Negative for congestion, ear discharge and sinus pressure.   Eyes: Negative for discharge.  Respiratory: Negative for cough.   Cardiovascular: Negative for chest pain.  Gastrointestinal: Negative for abdominal pain and diarrhea.  Genitourinary: Negative for frequency and hematuria.  Musculoskeletal: Negative for back pain.  Skin: Negative for rash.  Neurological: Negative for seizures and headaches.  Psychiatric/Behavioral: Positive for agitation. Negative for hallucinations.     Physical Exam Updated Vital Signs BP 139/79   Pulse 80   Temp 98.2 F (36.8 C) (Oral)   Resp (!) 21   Ht 5\' 5"  (1.651 m)   Wt 103 kg   SpO2 100%   BMI 37.79 kg/m   Physical Exam Vitals signs and nursing note reviewed.  Constitutional:      Appearance: She is well-developed.  HENT:     Head: Normocephalic.     Nose: Nose normal.     Mouth/Throat:     Mouth: Mucous membranes are moist.  Eyes:     General: No scleral icterus.    Conjunctiva/sclera: Conjunctivae normal.  Neck:     Musculoskeletal: Neck supple.     Thyroid: No thyromegaly.  Cardiovascular:     Rate and Rhythm: Normal rate and regular rhythm.     Heart sounds: No murmur. No  friction rub. No gallop.   Pulmonary:     Breath sounds: No stridor. No wheezing or rales.  Chest:     Chest wall: No tenderness.  Abdominal:     General: There is no distension.     Tenderness: There is no abdominal tenderness. There is no rebound.  Musculoskeletal: Normal range of motion.  Lymphadenopathy:     Cervical: No cervical adenopathy.  Skin:    Findings: No erythema or rash.  Neurological:  Mental Status: She is alert and oriented to person, place, and time.     Motor: No abnormal muscle tone.     Coordination: Coordination normal.  Psychiatric:     Comments: Patient very anxious and lips are quivering      ED Treatments / Results  Labs (all labs ordered are listed, but only abnormal results are displayed) Labs Reviewed  CBC WITH DIFFERENTIAL/PLATELET - Abnormal; Notable for the following components:      Result Value   WBC 11.6 (*)    RBC 5.59 (*)    Neutro Abs 9.3 (*)    All other components within normal limits  COMPREHENSIVE METABOLIC PANEL - Abnormal; Notable for the following components:   Potassium 3.1 (*)    Glucose, Bld 117 (*)    Albumin 3.0 (*)    All other components within normal limits    EKG None  Radiology Dg Chest 2 View  Result Date: 08/17/2018 CLINICAL DATA:  Weakness, shortness of breath EXAM: CHEST - 2 VIEW COMPARISON:  12/31/2015 FINDINGS: Low volume examination with mild cardiomegaly. No acute abnormality of the lungs. No abnormality of the osseous structures. IMPRESSION: Low volume examination with mild cardiomegaly. No acute abnormality of the lungs. Electronically Signed   By: Eddie Candle M.D.   On: 08/17/2018 09:44    Procedures Procedures (including critical care time)  Medications Ordered in ED Medications  LORazepam (ATIVAN) tablet 1 mg (1 mg Oral Given 08/17/18 0940)  potassium chloride SA (K-DUR,KLOR-CON) CR tablet 40 mEq (40 mEq Oral Given 08/17/18 1059)     Initial Impression / Assessment and Plan / ED Course   I have reviewed the triage vital signs and the nursing notes.  Pertinent labs & imaging results that were available during my care of the patient were reviewed by me and considered in my medical decision making (see chart for details).     Labs unremarkable for mild hypokalemia.  Patient improved with Xanax.  Suspect anxiety causing the symptoms.  She is given a prescription of Vistaril she will follow-up next week with her doctor and rest at home today  Final Clinical Impressions(s) / ED Diagnoses   Final diagnoses:  Anxiety    ED Discharge Orders         Ordered    hydrOXYzine (ATARAX/VISTARIL) 25 MG tablet  Every 6 hours PRN     08/17/18 1221           Milton Ferguson, MD 08/17/18 1226

## 2018-10-31 ENCOUNTER — Ambulatory Visit (INDEPENDENT_AMBULATORY_CARE_PROVIDER_SITE_OTHER): Payer: Medicaid Other | Admitting: *Deleted

## 2018-10-31 ENCOUNTER — Other Ambulatory Visit: Payer: Self-pay

## 2018-10-31 DIAGNOSIS — Z3042 Encounter for surveillance of injectable contraceptive: Secondary | ICD-10-CM

## 2018-10-31 MED ORDER — MEDROXYPROGESTERONE ACETATE 150 MG/ML IM SUSP
150.0000 mg | Freq: Once | INTRAMUSCULAR | Status: AC
  Administered 2018-10-31: 150 mg via INTRAMUSCULAR

## 2018-10-31 NOTE — Progress Notes (Signed)
Depo Provera 150 mg given IM in left deltoid. Patient tolerated well. Next dose in 12 Weeks.

## 2019-01-15 ENCOUNTER — Other Ambulatory Visit: Payer: Self-pay | Admitting: *Deleted

## 2019-01-15 ENCOUNTER — Other Ambulatory Visit: Payer: Self-pay | Admitting: Adult Health

## 2019-01-15 MED ORDER — MEDROXYPROGESTERONE ACETATE 150 MG/ML IM SUSP: 150.0000 mg | INTRAMUSCULAR | 4 refills | Status: DC

## 2019-01-15 NOTE — Telephone Encounter (Signed)
Patient called regarding her refill for her appointment tomorrow.  I see that we received a request at 9:39am this morning, but it looks like it was sent to [facility].  Will you please check on this.  Thx.

## 2019-01-16 ENCOUNTER — Ambulatory Visit: Payer: Medicaid Other

## 2019-01-21 ENCOUNTER — Other Ambulatory Visit: Payer: Self-pay

## 2019-01-21 ENCOUNTER — Ambulatory Visit (INDEPENDENT_AMBULATORY_CARE_PROVIDER_SITE_OTHER): Payer: Medicaid Other | Admitting: *Deleted

## 2019-01-21 DIAGNOSIS — Z3042 Encounter for surveillance of injectable contraceptive: Secondary | ICD-10-CM

## 2019-01-21 MED ORDER — MEDROXYPROGESTERONE ACETATE 150 MG/ML IM SUSP
150.0000 mg | Freq: Once | INTRAMUSCULAR | Status: AC
  Administered 2019-01-21: 150 mg via INTRAMUSCULAR

## 2019-01-21 NOTE — Progress Notes (Signed)
Depo Provera 150 mg given IM in right deltoid. Patient tolerated well. Next dose in 12 weeks.  

## 2019-04-15 ENCOUNTER — Ambulatory Visit: Payer: Medicaid Other

## 2019-04-17 ENCOUNTER — Ambulatory Visit (INDEPENDENT_AMBULATORY_CARE_PROVIDER_SITE_OTHER): Payer: Medicaid Other | Admitting: *Deleted

## 2019-04-17 ENCOUNTER — Other Ambulatory Visit: Payer: Self-pay

## 2019-04-17 ENCOUNTER — Encounter: Payer: Self-pay | Admitting: *Deleted

## 2019-04-17 DIAGNOSIS — Z3202 Encounter for pregnancy test, result negative: Secondary | ICD-10-CM

## 2019-04-17 DIAGNOSIS — Z3042 Encounter for surveillance of injectable contraceptive: Secondary | ICD-10-CM | POA: Diagnosis not present

## 2019-04-17 DIAGNOSIS — Z308 Encounter for other contraceptive management: Secondary | ICD-10-CM

## 2019-04-17 LAB — POCT URINE PREGNANCY: Preg Test, Ur: NEGATIVE

## 2019-04-17 MED ORDER — MEDROXYPROGESTERONE ACETATE 150 MG/ML IM SUSP
150.0000 mg | Freq: Once | INTRAMUSCULAR | Status: AC
  Administered 2019-04-17: 16:00:00 150 mg via INTRAMUSCULAR

## 2019-04-17 NOTE — Progress Notes (Signed)
   NURSE VISIT- INJECTION  SUBJECTIVE:  Joyce [name] is a 43 y.o. [id number] female here for a Depo Provera for contraception/period management. She is a GYN patient.   OBJECTIVE:  There were no vitals taken for this visit.  Appears well, in no apparent distress  Injection administered in: Left deltoid  Meds ordered this encounter  Medications  . medroxyPROGESTERone (DEPO-PROVERA) injection 150 mg    ASSESSMENT: GYN patient Depo Provera for contraception/period management PLAN: Follow-up: in 11-13 weeks for next Depo   Levy Pupa  04/17/2019 3:45 PM

## 2019-07-04 ENCOUNTER — Ambulatory Visit: Payer: Medicaid Other

## 2019-07-10 ENCOUNTER — Ambulatory Visit: Payer: Medicaid Other

## 2019-07-15 ENCOUNTER — Ambulatory Visit: Payer: Medicaid Other

## 2019-07-17 ENCOUNTER — Ambulatory Visit (INDEPENDENT_AMBULATORY_CARE_PROVIDER_SITE_OTHER): Payer: Medicaid Other

## 2019-07-17 ENCOUNTER — Other Ambulatory Visit: Payer: Self-pay

## 2019-07-17 VITALS — Ht 65.0 in | Wt 240.0 lb

## 2019-07-17 DIAGNOSIS — Z3042 Encounter for surveillance of injectable contraceptive: Secondary | ICD-10-CM | POA: Diagnosis not present

## 2019-07-17 MED ORDER — MEDROXYPROGESTERONE ACETATE 150 MG/ML IM SUSP
150.0000 mg | Freq: Once | INTRAMUSCULAR | Status: AC
  Administered 2019-07-17: 150 mg via INTRAMUSCULAR

## 2019-07-17 NOTE — Progress Notes (Signed)
   NURSE VISIT- INJECTION  SUBJECTIVE:  Joyce [name] is a 44 y.o. [id number] female here for a Depo Provera for contraception/period management. She is a GYN patient.   OBJECTIVE:  There were no vitals taken for this visit.  Appears well, in no apparent distress  Injection administered in: Left deltoid  No orders of the defined types were placed in this encounter.   ASSESSMENT: GYN patient Depo Provera for contraception/period management PLAN: Follow-up: in 11-13 weeks for next Depo   Ladonna Snide  07/17/2019 11:03 AM

## 2019-10-09 ENCOUNTER — Ambulatory Visit: Payer: Medicaid Other

## 2019-10-15 ENCOUNTER — Other Ambulatory Visit: Payer: Self-pay

## 2019-10-15 ENCOUNTER — Encounter: Payer: Self-pay | Admitting: Obstetrics & Gynecology

## 2019-10-15 ENCOUNTER — Ambulatory Visit (INDEPENDENT_AMBULATORY_CARE_PROVIDER_SITE_OTHER): Payer: Medicaid Other | Admitting: *Deleted

## 2019-10-15 DIAGNOSIS — Z3042 Encounter for surveillance of injectable contraceptive: Secondary | ICD-10-CM

## 2019-10-15 MED ORDER — MEDROXYPROGESTERONE ACETATE 150 MG/ML IM SUSP
150.0000 mg | Freq: Once | INTRAMUSCULAR | Status: AC
  Administered 2019-10-15: 150 mg via INTRAMUSCULAR

## 2019-10-15 NOTE — Progress Notes (Signed)
   NURSE VISIT- INJECTION  SUBJECTIVE:  Joyce [name] is a 44 y.o. [id number] female here for a Depo Provera for contraception/period management. She is a GYN patient.   OBJECTIVE:  There were no vitals taken for this visit.  Appears well, in no apparent distress  Injection administered in: Right deltoid  No orders of the defined types were placed in this encounter.   ASSESSMENT: GYN patient Depo Provera for contraception/period management PLAN: Follow-up: in 11-13 weeks for next Depo   Rolena Infante  10/15/2019 4:04 PM

## 2020-01-07 ENCOUNTER — Encounter: Payer: Self-pay | Admitting: *Deleted

## 2020-01-07 ENCOUNTER — Ambulatory Visit (INDEPENDENT_AMBULATORY_CARE_PROVIDER_SITE_OTHER): Payer: Medicaid Other | Admitting: *Deleted

## 2020-01-07 DIAGNOSIS — Z3042 Encounter for surveillance of injectable contraceptive: Secondary | ICD-10-CM

## 2020-01-07 MED ORDER — MEDROXYPROGESTERONE ACETATE 150 MG/ML IM SUSP
150.0000 mg | Freq: Once | INTRAMUSCULAR | Status: AC
  Administered 2020-01-07: 150 mg via INTRAMUSCULAR

## 2020-01-07 NOTE — Progress Notes (Signed)
   NURSE VISIT- INJECTION  SUBJECTIVE:  Joyce [name] is a 44 y.o. [id number] female here for a Depo Provera for contraception/period management. She is a GYN patient.   OBJECTIVE:  There were no vitals taken for this visit.  Appears well, in no apparent distress  Injection administered in: Left deltoid  No orders of the defined types were placed in this encounter.   ASSESSMENT: GYN patient Depo Provera for contraception/period management PLAN: Follow-up: in 11-13 weeks for next Depo   Alice Rieger  01/07/2020 4:10 PM

## 2020-03-04 ENCOUNTER — Emergency Department (HOSPITAL_COMMUNITY): Payer: Medicaid Other

## 2020-03-04 ENCOUNTER — Inpatient Hospital Stay (HOSPITAL_COMMUNITY)
Admission: EM | Admit: 2020-03-04 | Discharge: 2020-03-10 | DRG: 065 | Disposition: A | Discharge status: Rehabilitation Facility | Payer: Medicaid Other | Attending: Family Medicine | Admitting: Family Medicine

## 2020-03-04 ENCOUNTER — Encounter (HOSPITAL_COMMUNITY): Payer: Self-pay | Admitting: Internal Medicine

## 2020-03-04 DIAGNOSIS — R29703 NIHSS score 3: Secondary | ICD-10-CM | POA: Diagnosis present

## 2020-03-04 DIAGNOSIS — Z8249 Family history of ischemic heart disease and other diseases of the circulatory system: Secondary | ICD-10-CM

## 2020-03-04 DIAGNOSIS — R531 Weakness: Secondary | ICD-10-CM | POA: Diagnosis not present

## 2020-03-04 DIAGNOSIS — E663 Overweight: Secondary | ICD-10-CM | POA: Diagnosis present

## 2020-03-04 DIAGNOSIS — G8194 Hemiplegia, unspecified affecting left nondominant side: Secondary | ICD-10-CM | POA: Diagnosis present

## 2020-03-04 DIAGNOSIS — Z825 Family history of asthma and other chronic lower respiratory diseases: Secondary | ICD-10-CM

## 2020-03-04 DIAGNOSIS — I639 Cerebral infarction, unspecified: Secondary | ICD-10-CM | POA: Diagnosis not present

## 2020-03-04 DIAGNOSIS — Z6837 Body mass index (BMI) 37.0-37.9, adult: Secondary | ICD-10-CM

## 2020-03-04 DIAGNOSIS — I6389 Other cerebral infarction: Principal | ICD-10-CM | POA: Diagnosis present

## 2020-03-04 DIAGNOSIS — G43909 Migraine, unspecified, not intractable, without status migrainosus: Secondary | ICD-10-CM | POA: Diagnosis present

## 2020-03-04 DIAGNOSIS — F32A Depression, unspecified: Secondary | ICD-10-CM | POA: Diagnosis present

## 2020-03-04 DIAGNOSIS — Z83438 Family history of other disorder of lipoprotein metabolism and other lipidemia: Secondary | ICD-10-CM

## 2020-03-04 DIAGNOSIS — R296 Repeated falls: Secondary | ICD-10-CM | POA: Diagnosis not present

## 2020-03-04 DIAGNOSIS — D72829 Elevated white blood cell count, unspecified: Secondary | ICD-10-CM | POA: Diagnosis not present

## 2020-03-04 DIAGNOSIS — Z79899 Other long term (current) drug therapy: Secondary | ICD-10-CM

## 2020-03-04 DIAGNOSIS — Z833 Family history of diabetes mellitus: Secondary | ICD-10-CM

## 2020-03-04 DIAGNOSIS — I10 Essential (primary) hypertension: Secondary | ICD-10-CM | POA: Diagnosis not present

## 2020-03-04 DIAGNOSIS — Z820 Family history of epilepsy and other diseases of the nervous system: Secondary | ICD-10-CM

## 2020-03-04 DIAGNOSIS — Z20822 Contact with and (suspected) exposure to covid-19: Secondary | ICD-10-CM | POA: Diagnosis present

## 2020-03-04 DIAGNOSIS — Z841 Family history of disorders of kidney and ureter: Secondary | ICD-10-CM

## 2020-03-04 HISTORY — DX: Cerebral infarction, unspecified: I63.9

## 2020-03-04 LAB — DIFFERENTIAL
Abs Immature Granulocytes: 0.04 10*3/uL (ref 0.00–0.07)
Basophils Absolute: 0.1 10*3/uL (ref 0.0–0.1)
Basophils Relative: 0 %
Eosinophils Absolute: 0.1 10*3/uL (ref 0.0–0.5)
Eosinophils Relative: 1 %
Immature Granulocytes: 0 %
Lymphocytes Relative: 26 %
Lymphs Abs: 3.1 10*3/uL (ref 0.7–4.0)
Monocytes Absolute: 0.6 10*3/uL (ref 0.1–1.0)
Monocytes Relative: 5 %
Neutro Abs: 8.3 10*3/uL — ABNORMAL HIGH (ref 1.7–7.7)
Neutrophils Relative %: 68 %

## 2020-03-04 LAB — I-STAT CHEM 8, ED
BUN: 7 mg/dL (ref 6–20)
Calcium, Ion: 1.21 mmol/L (ref 1.15–1.40)
Chloride: 103 mmol/L (ref 98–111)
Creatinine, Ser: 0.8 mg/dL (ref 0.44–1.00)
Glucose, Bld: 108 mg/dL — ABNORMAL HIGH (ref 70–99)
HCT: 48 % — ABNORMAL HIGH (ref 36.0–46.0)
Hemoglobin: 16.3 g/dL — ABNORMAL HIGH (ref 12.0–15.0)
Potassium: 3.7 mmol/L (ref 3.5–5.1)
Sodium: 142 mmol/L (ref 135–145)
TCO2: 24 mmol/L (ref 22–32)

## 2020-03-04 LAB — CBC
HCT: 46.2 % — ABNORMAL HIGH (ref 36.0–46.0)
Hemoglobin: 15.4 g/dL — ABNORMAL HIGH (ref 12.0–15.0)
MCH: 27 pg (ref 26.0–34.0)
MCHC: 33.3 g/dL (ref 30.0–36.0)
MCV: 80.9 fL (ref 80.0–100.0)
Platelets: 301 10*3/uL (ref 150–400)
RBC: 5.71 MIL/uL — ABNORMAL HIGH (ref 3.87–5.11)
RDW: 14.5 % (ref 11.5–15.5)
WBC: 12.2 10*3/uL — ABNORMAL HIGH (ref 4.0–10.5)
nRBC: 0 % (ref 0.0–0.2)

## 2020-03-04 LAB — COMPREHENSIVE METABOLIC PANEL
ALT: 22 U/L (ref 0–44)
AST: 20 U/L (ref 15–41)
Albumin: 3.9 g/dL (ref 3.5–5.0)
Alkaline Phosphatase: 47 U/L (ref 38–126)
Anion gap: 10 (ref 5–15)
BUN: 9 mg/dL (ref 6–20)
CO2: 24 mmol/L (ref 22–32)
Calcium: 9.3 mg/dL (ref 8.9–10.3)
Chloride: 102 mmol/L (ref 98–111)
Creatinine, Ser: 0.85 mg/dL (ref 0.44–1.00)
GFR calc non Af Amer: >60 mL/min (ref >60)
Glucose, Bld: 114 mg/dL — ABNORMAL HIGH (ref 70–99)
Potassium: 3.5 mmol/L (ref 3.5–5.1)
Sodium: 136 mmol/L (ref 135–145)
Total Bilirubin: 0.6 mg/dL (ref 0.3–1.2)
Total Protein: 8.2 g/dL — ABNORMAL HIGH (ref 6.5–8.1)

## 2020-03-04 LAB — PROTIME-INR
INR: 1 (ref 0.8–1.2)
Prothrombin Time: 12.9 seconds (ref 11.4–15.2)

## 2020-03-04 LAB — RESPIRATORY PANEL BY RT PCR (FLU A&B, COVID)
Influenza A by PCR: NEGATIVE
Influenza B by PCR: NEGATIVE
SARS Coronavirus 2 by RT PCR: NEGATIVE

## 2020-03-04 LAB — CBG MONITORING, ED: Glucose-Capillary: 142 mg/dL — ABNORMAL HIGH (ref 70–99)

## 2020-03-04 LAB — ETHANOL: Alcohol, Ethyl (B): <10 mg/dL (ref <10)

## 2020-03-04 LAB — APTT: aPTT: 30 seconds (ref 24–36)

## 2020-03-04 IMAGING — CT CT HEAD CODE STROKE
3 of 4 series · 14 of 47 positions shown, 16 images · non-contrast
Comparison: None.

CLINICAL DATA: Code stroke. Left-sided numbness beginning 9 hours
ago.

EXAM:
CT HEAD WITHOUT CONTRAST
TECHNIQUE: Contiguous axial images were obtained from the base of the skull
through the vertex without intravenous contrast.

[Series 4: coronal soft · coronal · 0.33mm/px · 3 of 68 slices shown]
[im 23/68  brain]
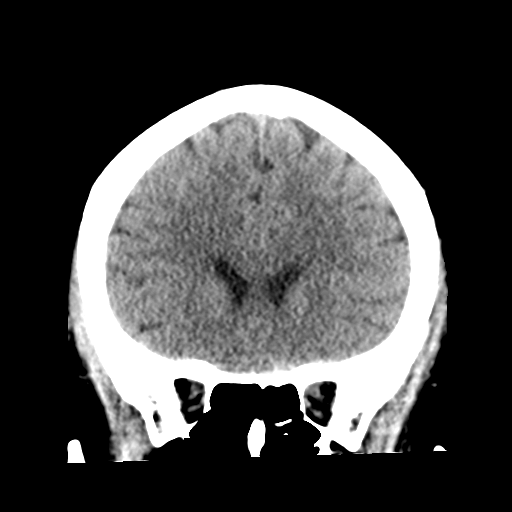
[im 30/68  brain]
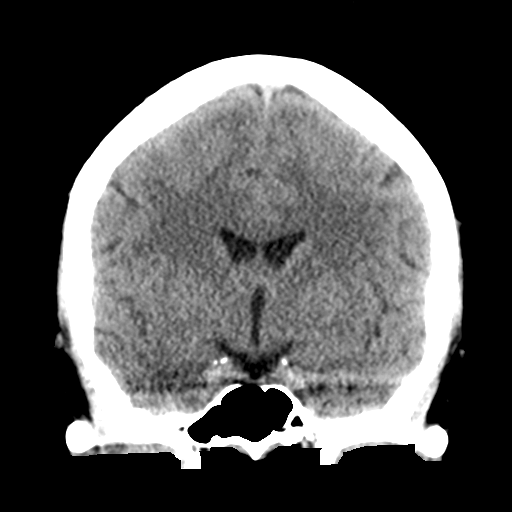
[im 38/68  brain]
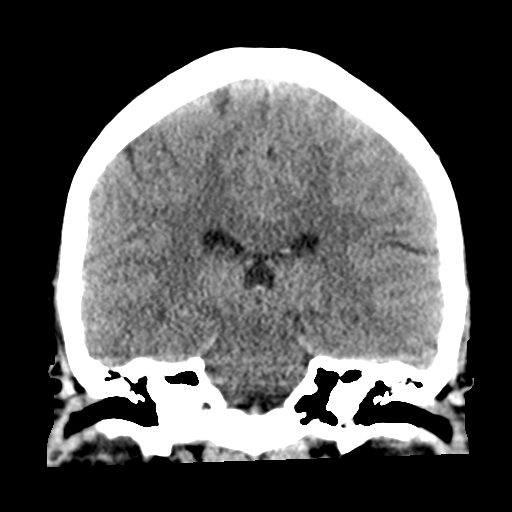

[Series 5: sagittal soft · sagittal · 0.38mm/px · 3 of 57 slices shown]
[im 19/57  brain]
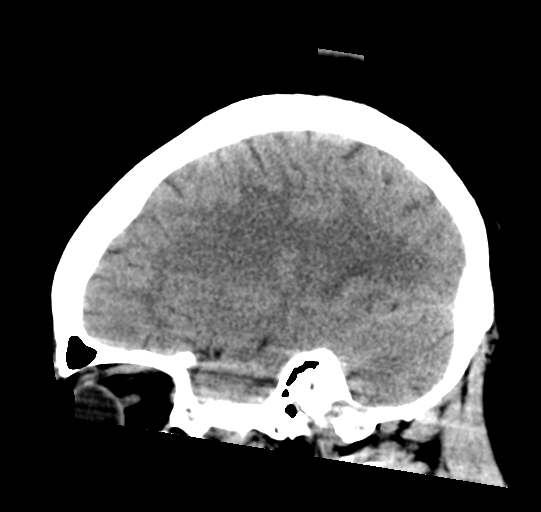
[im 29/57  brain]
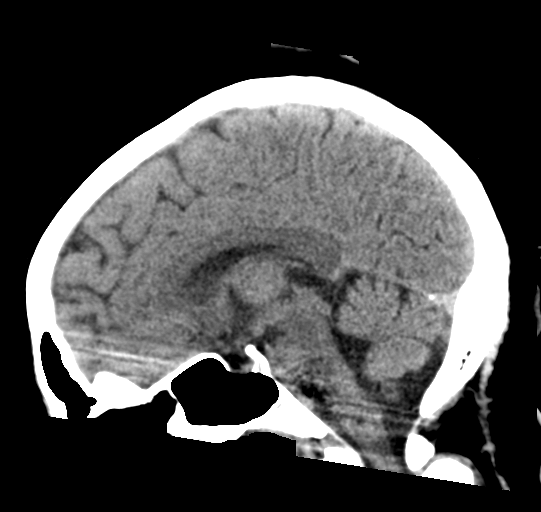
[im 38/57  brain]
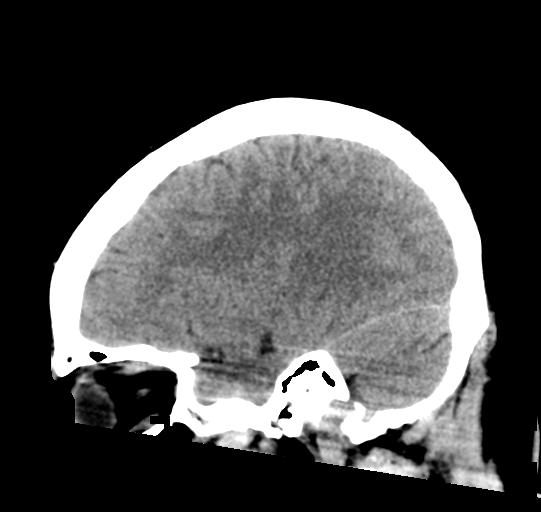

[Series 6: head ax w o · axial · 0.35mm/px · z∈[+92,+256]mm · 8 of 39 slices shown, 10 images]
[im 3/39  brain]
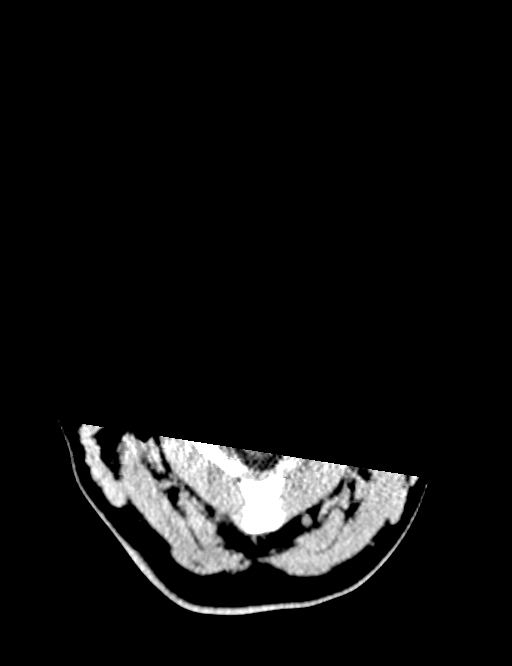
[im 3/39  bone]
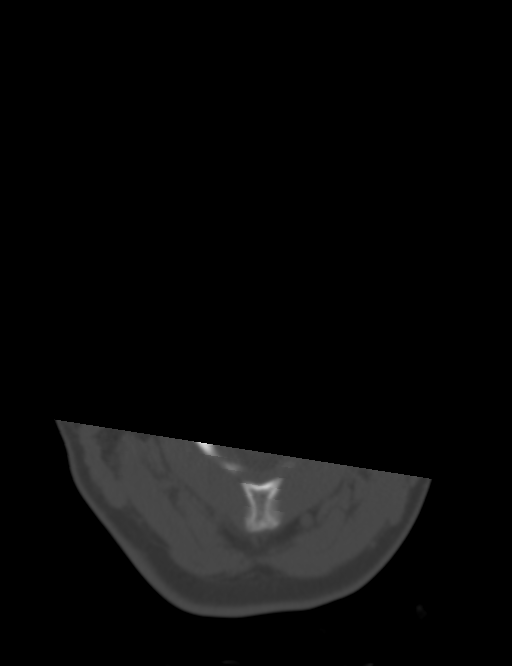
[im 8/39  brain]
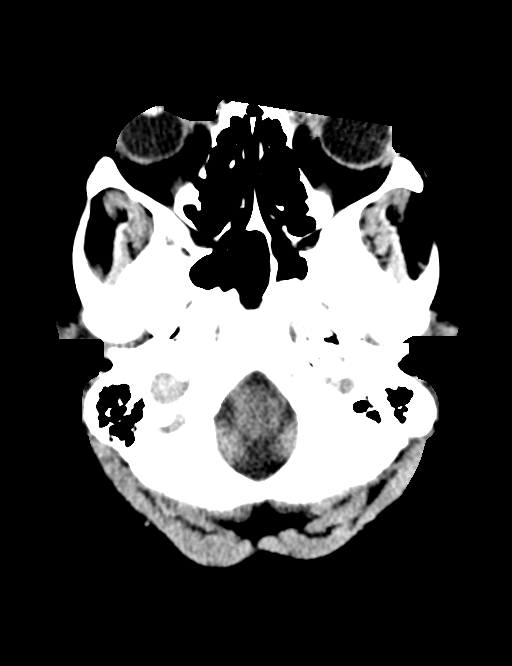
[im 12/39  brain]
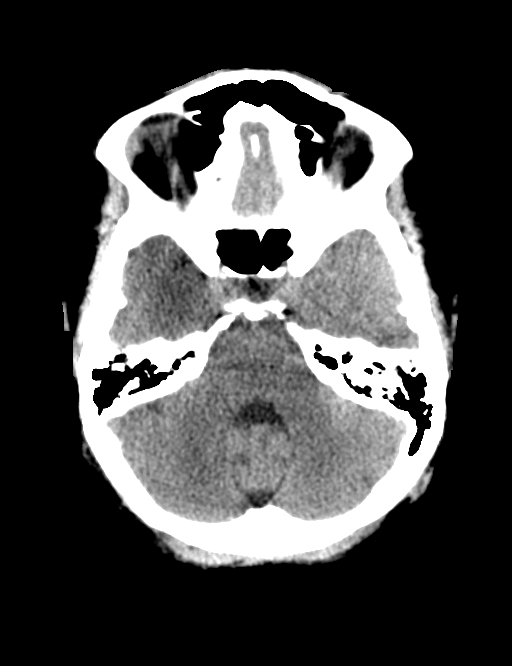
[im 17/39  brain]
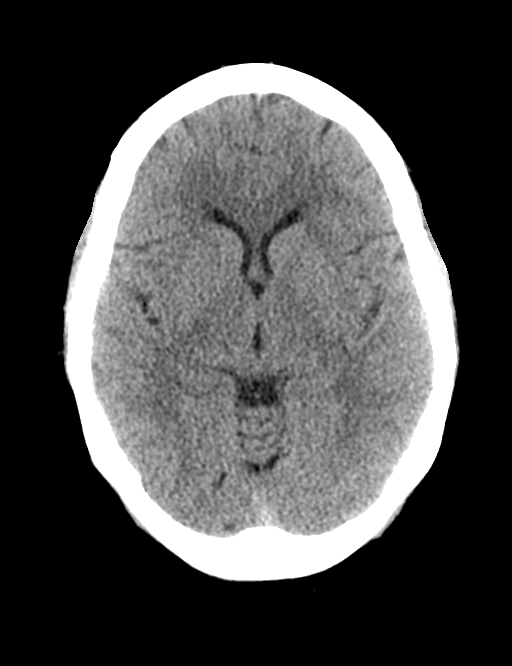
[im 22/39  brain]
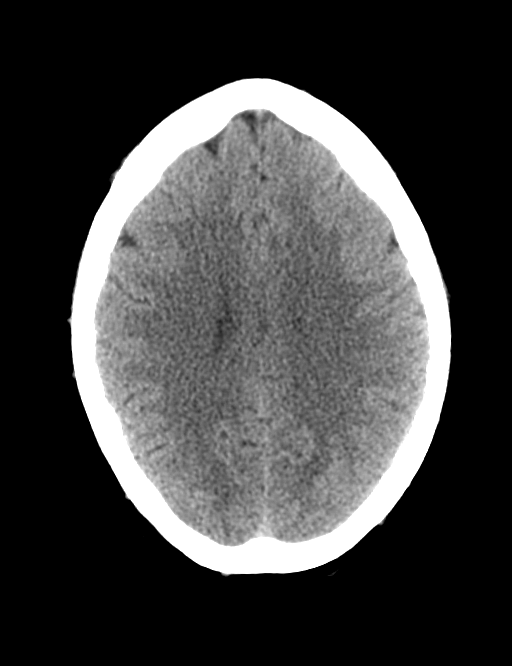
[im 22/39  bone]
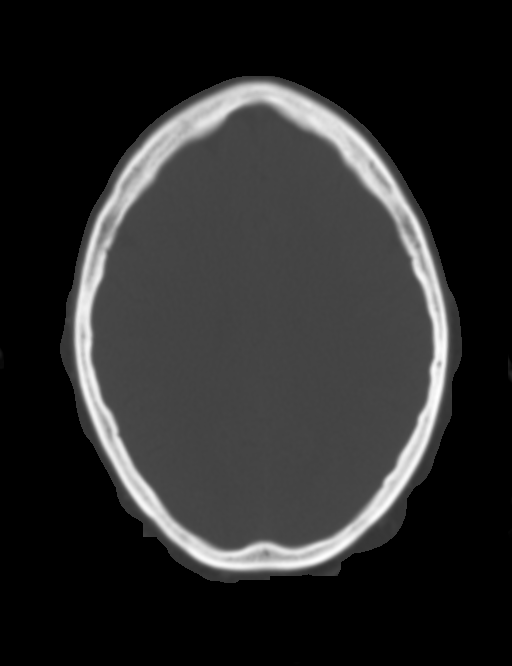
[im 27/39  brain]
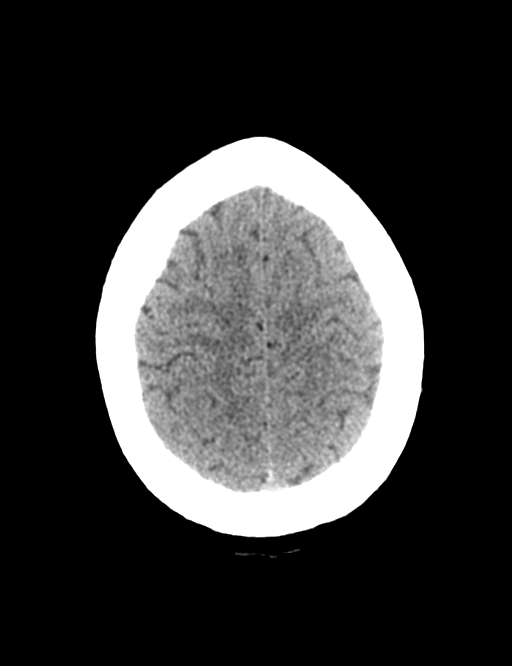
[im 31/39  brain]
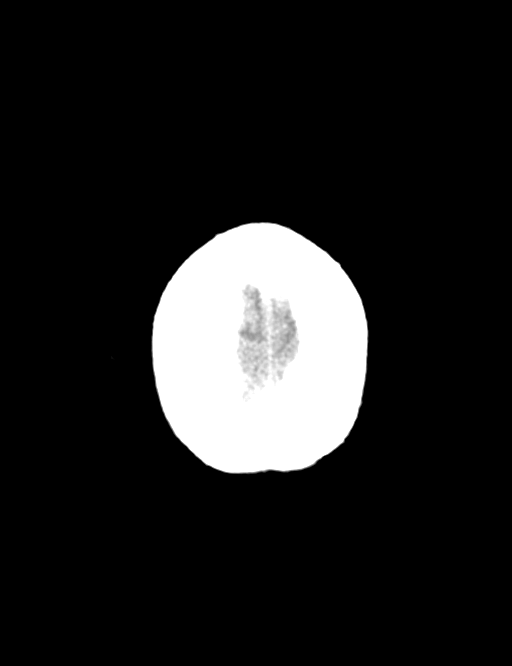
[im 36/39  brain]
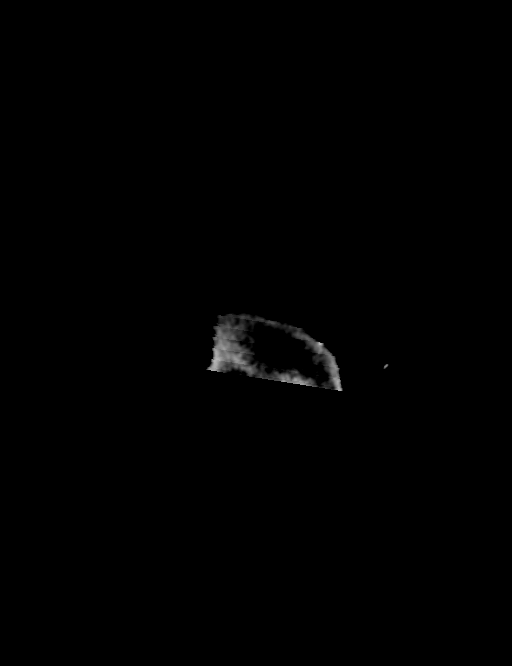

[14 of 47 positions shown; findings below may reference images not displayed]

FINDINGS: Brain: Normal appearance without evidence malformation, atrophy, old
or acute infarction, mass lesion, hemorrhage, hydrocephalus or
extra-axial collection.

Vascular: No abnormal vascular finding.

Skull: Normal

Sinuses/Orbits: Clear/normal

Other: None

ASPECTS ([place] Stroke Program Early CT Score)

- Ganglionic level infarction (caudate, lentiform nuclei, internal
capsule, insula, M1-M3 cortex): 7

- Supraganglionic infarction (M4-M6 cortex): 3

Total score (0-10 with 10 being normal): 10
IMPRESSION: 1. Normal head CT.
2. ASPECTS is 10.
3. These results were called by telephone at the time of
interpretation on [DATE] at [DATE] to provider RAIN ,
who verbally acknowledged these results.

## 2020-03-04 MED ORDER — LORAZEPAM 2 MG/ML IJ SOLN
1.0000 mg | Freq: Once | INTRAMUSCULAR | Status: AC
  Administered 2020-03-04: 1 mg via INTRAVENOUS
  Filled 2020-03-04: qty 1

## 2020-03-04 MED ORDER — ASPIRIN 81 MG PO CHEW
324.0000 mg | CHEWABLE_TABLET | Freq: Once | ORAL | Status: AC
  Administered 2020-03-04: 324 mg via ORAL
  Filled 2020-03-04: qty 4

## 2020-03-04 NOTE — ED Provider Notes (Addendum)
[facility] EMERGENCY DEPARTMENT Provider Note   CSN: [id number] Arrival date & time: 03/04/20  2017  An emergency department physician performed an initial assessment on this suspected stroke patient at 2033.  History Chief Complaint  Patient presents with  . Numbness    Joyce [name] is a 44 y.o. female.  Patient with acute onset around 12 noon of left-sided numbness left upper extremity left lower extremity.  Lots of anxiety.  Some question of speech problem but none now.  Also feels that there is a little bit of weakness to the left side.  Patient presented about 830.  So 8-1/2 hours after the onset of the symptoms.  Patient states that at home she did take a nap thinking it would go away but it did not says she was falling at home and finding it difficult to walk.  But patient drove herself here.  No headache.  Past medical history significant for migraines.  Depression hypertension posttraumatic stress disorder.  Based on her presentation do that it possibly could have been a band code stroke was initiated.  Teleneurologist so was initiated in consultation.        Past Medical History:  Diagnosis Date  . AMA (advanced maternal age) multigravida 35+ 11/12/2014  . Anemia   . Depression   . Hypertension   . Migraine   . Pregnant 11/12/2014  . PTSD (post-traumatic stress disorder) 05/01/2017  . Round ligament pain 11/12/2014  . Short of breath on exertion 11/12/2014  . Trichomonas infection     Patient Active Problem List   Diagnosis Date Noted  . MDD (major depressive disorder), recurrent episode, moderate ([place]) 05/01/2017  . PTSD (post-traumatic stress disorder) 05/01/2017  . Depression, recurrent ([place]) 12/07/2016  . Essential hypertension 07/20/2016  . Uterine fibroid 01/27/2016  . 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 [place] multiparity 11/26/2014    Past Surgical History:  Procedure Laterality Date  . CHOLECYSTECTOMY        OB History    Gravida  11   Para  8   Term  8   Preterm  0   AB  3   Living  8     SAB  1   TAB  2   Ectopic      Multiple  0   Live Births  8           Family History  Problem Relation Age of Onset  . Diabetes Mother   . Hypertension Mother   . Kidney disease Mother   . Asthma Mother   . Hyperlipidemia Mother   . Alzheimer's disease Father   . Diabetes Father   . Kidney disease Sister   . Asthma Son   . Diabetes Sister   . Asthma Son   . ADD / ADHD Son   . Heart murmur Son   . Asthma Son     Social History   Tobacco Use  . Smoking status: Never Smoker  . Smokeless tobacco: Never Used  Vaping Use  . Vaping Use: Never used  Substance Use Topics  . Alcohol use: Yes    Comment: occasionally  . Drug use: No    Home Medications Prior to Admission medications   Medication Sig Start Date End Date Taking? Authorizing Provider  amLODipine (NORVASC) 5 MG tablet TAKE 1 TABLET(5 MG) BY MOUTH DAILY Patient taking differently: Take 5 mg by mouth daily.  07/25/17   Cresenzo-Dishmon, Joaquim Lai, CNM  dexamethasone (  DECADRON) 4 MG tablet Take 1 tablet (4 mg total) by mouth 2 (two) times daily with a meal. Patient taking differently: Take 4 mg by mouth daily.  11/03/17   Lily Kocher, PA-C  escitalopram (LEXAPRO) 20 MG tablet Take 1 tablet (20 mg total) by mouth daily. 10/11/17   Norman Clay, MD  hydrOXYzine (ATARAX/VISTARIL) 25 MG tablet Take 1 tablet (25 mg total) by mouth every 6 (six) hours as needed for anxiety. 08/17/18   Milton Ferguson, MD  ibuprofen (ADVIL,MOTRIN) 200 MG tablet Take 200 mg by mouth every 6 (six) hours as needed for mild pain or moderate pain.    [provider]  lisinopril-hydrochlorothiazide (PRINZIDE,ZESTORETIC) 20-25 MG tablet take one tablet by mouth once daily 04/04/17   [provider]  medroxyPROGESTERone (DEPO-PROVERA) 150 MG/ML injection Inject 1 mL (150 mg total) into the muscle every 3 (three) months. 01/15/19    Florian Buff, MD  Prenatal Vit-Fe Fumarate-FA (PREPLUS) 27-1 MG TABS TAKE 1 TABLET BY MOUTH DAILY AT 12 NOON Patient taking differently: Take 1 tablet by mouth daily.  04/04/17   Estill Dooms, NP  promethazine (PHENERGAN) 25 MG tablet Take 1 tablet (25 mg total) by mouth every 6 (six) hours as needed for nausea or vomiting. 07/15/18   Mesner, Corene Cornea, MD  SUMAtriptan (IMITREX) 50 MG tablet Take 1 tablet (50 mg total) by mouth once. May repeat in 2 hours if headache persists or recurs. 02/15/17 07/15/18  Raylene Everts, MD  traZODone (DESYREL) 50 MG tablet Take 1 tablet (50 mg total) by mouth at bedtime as needed for sleep. Patient taking differently: Take 50 mg by mouth at bedtime.  10/11/17   Norman Clay, MD    Allergies    Patient has no known allergies.  Review of Systems   Review of Systems  Constitutional: Negative for chills and fever.  HENT: Negative for congestion, rhinorrhea and sore throat.   Eyes: Negative for visual disturbance.  Respiratory: Negative for cough and shortness of breath.   Cardiovascular: Negative for chest pain and leg swelling.  Gastrointestinal: Negative for abdominal pain, diarrhea, nausea and vomiting.  Genitourinary: Negative for dysuria.  Musculoskeletal: Negative for back pain and neck pain.  Skin: Negative for rash.  Neurological: Positive for speech difficulty, weakness and numbness. Negative for dizziness, light-headedness and headaches.  Hematological: Does not bruise/bleed easily.  Psychiatric/Behavioral: Negative for confusion. The patient is nervous/anxious.     Physical Exam Updated Vital Signs BP 133/74   Pulse 98   Temp 99.2 F (37.3 C) (Oral)   Resp (!) 25   Wt 102.3 kg   SpO2 99%   BMI 37.53 kg/m   Physical Exam Vitals and nursing note reviewed.  Constitutional:      General: She is in acute distress.     Appearance: She is well-developed.  HENT:     Head: Normocephalic and atraumatic.  Eyes:     Extraocular  Movements: Extraocular movements intact.     Conjunctiva/sclera: Conjunctivae normal.     Pupils: Pupils are equal, round, and reactive to light.  Cardiovascular:     Rate and Rhythm: Normal rate and regular rhythm.     Heart sounds: No murmur heard.   Pulmonary:     Effort: Pulmonary effort is normal. No respiratory distress.     Breath sounds: Normal breath sounds.  Abdominal:     Palpations: Abdomen is soft.     Tenderness: There is no abdominal tenderness.  Musculoskeletal:  General: No swelling. Normal range of motion.     Cervical back: Neck supple.  Skin:    General: Skin is warm and dry.     Capillary Refill: Capillary refill takes less than 2 seconds.  Neurological:     Mental Status: She is alert.     Cranial Nerves: No cranial nerve deficit.     Sensory: Sensory deficit present.     Motor: Weakness present.     ED Results / Procedures / Treatments   Labs (all labs ordered are listed, but only abnormal results are displayed) Labs Reviewed  CBC - Abnormal; Notable for the following components:      Result Value   WBC 12.2 (*)    RBC 5.71 (*)    Hemoglobin 15.4 (*)    HCT 46.2 (*)    All other components within normal limits  DIFFERENTIAL - Abnormal; Notable for the following components:   Neutro Abs 8.3 (*)    All other components within normal limits  COMPREHENSIVE METABOLIC PANEL - Abnormal; Notable for the following components:   Glucose, Bld 114 (*)    Total Protein 8.2 (*)    All other components within normal limits  CBG MONITORING, ED - Abnormal; Notable for the following components:   Glucose-Capillary 142 (*)    All other components within normal limits  I-STAT CHEM 8, ED - Abnormal; Notable for the following components:   Glucose, Bld 108 (*)    Hemoglobin 16.3 (*)    HCT 48.0 (*)    All other components within normal limits  ETHANOL  PROTIME-INR  APTT  RAPID URINE DRUG SCREEN, HOSP PERFORMED  URINALYSIS, ROUTINE W REFLEX MICROSCOPIC    I-STAT BETA HCG BLOOD, ED ([facility], [facility], [facility] ONLY)    EKG EKG Interpretation  Date/Time:  Wednesday March 04 2020 20:36:40 EDT Ventricular Rate:  100 PR Interval:    QRS Duration: 80 QT Interval:  329 QTC Calculation: 425 R Axis:   62 Text Interpretation: Sinus tachycardia Multiple premature complexes, vent & supraven Consider left ventricular hypertrophy Baseline wander in lead(s) II III aVF Confirmed by Fredia Sorrow [phone]) on 03/04/2020 9:07:31 PM   Radiology CT HEAD CODE STROKE WO CONTRAST  Result Date: 03/04/2020 CLINICAL DATA:  Code stroke. Left-sided numbness beginning 9 hours ago. EXAM: CT HEAD WITHOUT CONTRAST TECHNIQUE: Contiguous axial images were obtained from the base of the skull through the vertex without intravenous contrast. COMPARISON:  None. FINDINGS: Brain: Normal appearance without evidence malformation, atrophy, old or acute infarction, mass lesion, hemorrhage, hydrocephalus or extra-axial collection. Vascular: No abnormal vascular finding. Skull: Normal Sinuses/Orbits: Clear/normal Other: None ASPECTS ([place] Stroke Program Early CT Score) - Ganglionic level infarction (caudate, lentiform nuclei, internal capsule, insula, M1-M3 cortex): 7 - Supraganglionic infarction (M4-M6 cortex): 3 Total score (0-10 with 10 being normal): 10 IMPRESSION: 1. Normal head CT. 2. ASPECTS is 10. 3. These results were called by telephone at the time of interpretation on 03/04/2020 at 8:56 pm to provider Fredia Sorrow , who verbally acknowledged these results. Electronically Signed   By: Nelson Chimes M.D.   On: 03/04/2020 20:56    Procedures Procedures (including critical care time)  CRITICAL CARE Performed by: Fredia Sorrow Total critical care time: 45 minutes Critical care time was exclusive of separately billable procedures and treating other patients. Critical care was necessary to treat or prevent imminent or life-threatening deterioration. Critical care was time spent  personally by me on the following activities: development of treatment plan with patient  and/or surrogate as well as nursing, discussions with consultants, evaluation of patient's response to treatment, examination of patient, obtaining history from patient or surrogate, ordering and performing treatments and interventions, ordering and review of laboratory studies, ordering and review of radiographic studies, pulse oximetry and re-evaluation of patient's condition.   Medications Ordered in ED Medications  aspirin chewable tablet 324 mg (has no administration in time range)  LORazepam (ATIVAN) injection 1 mg (1 mg Intravenous Given 03/04/20 2041)    ED Course  I have reviewed the triage vital signs and the nursing notes.  Pertinent labs & imaging results that were available during my care of the patient were reviewed by me and considered in my medical decision making (see chart for details).    MDM Rules/Calculators/A&P                          Patient with questionable acute stroke symptoms. Evaluated by teleneurology. Head CT without any acute findings. Teleneurologist is recommending admission MRI in the morning. And aspirin.  As stated above code stroke initiated.  Evaluated by teleneurology.  Felt that was no indication for acute intervention.  Patient out of TPA window.  And also not meeting band criteria.  Plan was for admission MRI in the morning and work-up.  And aspirin here tonight.  Patient was given some Ativan shortly after she arrived.  Recheck still had numbness to the left side.  And seemed to have some mild left-sided weakness.  No speech problems no visual problems.  Blood pressure was initially elevated.  But most recent blood pressure 161 systolic.  Discussed with hospitalist who will admit.  Final Clinical Impression(s) / ED Diagnoses Final diagnoses:  Cerebrovascular accident (CVA), unspecified mechanism [facility])    Rx / [place] Orders ED Discharge Orders    None         Fredia Sorrow, MD 03/04/20 2227    Fredia Sorrow, MD 03/04/20 2359

## 2020-03-04 NOTE — Progress Notes (Signed)
Code Stroke Documentation CALL TIME = 20:30 BEEPER TIME = 2032 EXAM STARTED 2043 EXAM FINISHED - 2045 IMAGES SENT TO [place] 2046 EXAM COMPLETED IN Epic 2047 [place] CALLED 2047

## 2020-03-04 NOTE — Consult Note (Signed)
[facility] Tele[facility]urology Consult Services   Date of Service:   03/04/2020 20:50:38  Impression:     .  I63.9 - Cerebrovascular accident (CVA), unspecified mechanism ([place])  Comments/Sign-Out: Patient with history of hypertension, depression, migraines Presents with left sided weakness, Head CT: No acute Intracranial hemorrhage. NIHSS 3 Etiology for Symptoms/presentation is unclear, stroke and demyelination are possible etiologies, Last time known well>4.5 hours therefore not a candidate for Thrombolysis. Symptoms not suggestive of Large Vessel Occlusion. Will need workup for stroke and CNS demyelination with brain MRI with and without contrast and further workup/treatment based on brain MRI results. If Brain MRI shows acute ischemic stroke then MRA head and neck, ECHO, Lipid Panel, HbA1c, Consider Hypercoagulable labs. Will be admitted for stroke workup.  Metrics: Last Known Well: 03/04/2020 12:00:00 [facility] Notification Time: 03/04/2020 20:50:38 Arrival Time: 03/04/2020 20:17:00 Stamp Time: 03/04/2020 20:50:38 Time First Login Attempt: 03/04/2020 20:56:12 Symptoms: left sided weakness, numbness. NIHSS Start Assessment Time: 03/04/2020 21:00:11 Patient is not a candidate for Thrombolytic. Thrombolytic Medical Decision: 03/04/2020 20:58:00 Patient was not deemed candidate for Thrombolytic because of following reasons: Last Well Known Above 4.5 Hours.  CT head was reviewed.  ED Physician notified of diagnostic impression and management plan on 03/04/2020 21:13:41  Advanced Imaging: Advanced Imaging Not Recommended because:  Clinical Presentation is not Suggestive of LVO and NIHSS is <6   Our recommendations are outlined below.  Recommendations:     .  Activate Stroke Protocol Admission/Order Set     .  Stroke/Telemetry Floor     .  Neuro Checks     .  Bedside Swallow Eval     .  DVT Prophylaxis     .  IV Fluids, Normal Saline     .  Head of Bed 30  Degrees     .  Euglycemia and Avoid Hyperthermia (PRN Acetaminophen)     .  Antiplatelet Therapy Recommended  Routine Consultation with [facility] for Follow up Care  Sign Out:     .  Discussed with Emergency Department Provider    ------------------------------------------------------------------------------  History of Present Illness: Patient is a 44 year old Female.  Patient was brought by private transportation with symptoms of left sided weakness, numbness.  [facility] TeleSt[facility] Consult Note Patient with history of hypertension, depression, migraines last known well per patient and Presenting symptoms/Chief complaint/reason for consult: 12:00 Developed left sided numbness, balance difficulty, Does not have a headache, Anticoagulation or Antiplatelet use: no Family history of Stroke: Smoking: no Chart reviewed for Pertinent medical, surgical, family history. Chart reviewed for Medications list and allergies. Pertinent positive and negative review of systems noted in History of Present Illness. When possible Patient/Family Consented to Telemedicine evaluation. When applicable I personally reviewed images of Head CT or MRI.   Past Medical History:     . Hypertension     . migraines, depression  Social History: Smoking: No  Anticoagulant use:  No  Antiplatelet use: No  Allergies:  NKDA    Examination: BP(170/96), Pulse(95), Blood Glucose(108) 1A: Level of Consciousness - Alert; keenly responsive + 0 1B: Ask Month and Age - Both Questions Right + 0 1C: Blink Eyes & Squeeze Hands - Performs Both Tasks + 0 2: Test Horizontal Extraocular Movements - Normal + 0 3: Test Visual Fields - No Visual Loss + 0 4: Test Facial Palsy (Use Grimace if Obtunded) - Normal symmetry + 0 5A: Test Left Arm Motor Drift - Drift, but doesn't hit bed + 1 5B: Test  Right Arm Motor Drift - No Drift for 10 Seconds + 0 6A: Test Left Leg Motor Drift - Drift, but doesn't hit bed + 1 6B:  Test Right Leg Motor Drift - No Drift for 5 Seconds + 0 7: Test Limb Ataxia (FNF/Heel-Shin) - No Ataxia + 0 8: Test Sensation - Mild-Moderate Loss: Less Sharp/More Dull + 1 9: Test Language/Aphasia - Normal; No aphasia + 0 10: Test Dysarthria - Normal + 0 11: Test Extinction/Inattention - No abnormality + 0  NIHSS Score: 3  Pre-Morbid Modified Rankin Scale: 0 Points = No symptoms at all   Patient/Family was informed the Neurology Consult would occur via TeleHealth consult by way of interactive audio and video telecommunications and consented to receiving care in this manner.   Patient is being evaluated for possible acute neurologic impairment and high probability of imminent or life-threatening deterioration. I spent total of 25 minutes providing care to this patient, including time for face to face visit via telemedicine, review of medical records, imaging studies and discussion of findings with providers, the patient and/or family.   Dr Elenor Quinones   [facility] [phone]  Case [id number]

## 2020-03-04 NOTE — ED Notes (Signed)
[place] paged @ 2033

## 2020-03-04 NOTE — ED Triage Notes (Signed)
Pt to er, pt c/o L sided numbness, states that the numbness started at 12 noon.  States that she was at work when the numbness started.  Pt states that she came in tonight because she kept falling.  Pt has elevated RR, states that she is very scared.  MD Zackowski at bedside for fast.

## 2020-03-04 NOTE — H&P (Signed)
History and Physical  Joyce [name] ZDG:[id number] DOB: 01/21/1976 DOA: 03/04/2020  Referring physician:  Fredia Sorrow, MD PCP: Patient, No Pcp Per  Patient coming from: Home  Chief Complaint: Left-sided numbness  HPI: Joyce [name] is a 44 y.o. female with medical history significant for hypertension, depression and migraine who presents to the emergency department due to left-sided numbness of upper and lower extremities which started around 12 noon.  Patient states that she took a nap with the hope that symptoms will go away, but on waking up, she had difficulty in walking and she states that she fell 3 times without hurting herself, so she decided to go to the ED for further evaluation and management, patient drove herself to the ED, she denies chest pain, shortness of breath, headache, nausea, vomiting or abdominal pain.  ED Course:  In the emergency department, she was hemodynamically stable.  Work-up in the ED showed leukocytosis and elevated H/H 15.4/46.2.  CT of head without contrast showed normal head CT.  Aspirin 324 mg x 1 was given, patient was giving Ativan 1 mg due to suspicion for anxiety.  Teleneurologist was consulted and recommended further stroke work-up.  Hospitalist was asked to admit patient for further evaluation and management.  Review of Systems: Constitutional: Negative for chills and fever.  HENT: Negative for ear pain and sore throat.   Eyes: Negative for pain and visual disturbance.  Respiratory: Negative for cough, chest tightness and shortness of breath.   Cardiovascular: Negative for chest pain and palpitations.  Gastrointestinal: Negative for abdominal pain and vomiting.  Endocrine: Negative for polyphagia and polyuria.  Genitourinary: Negative for decreased urine volume, dysuria, enuresis Musculoskeletal: Negative for arthralgias and back pain.  Skin: Negative for color change and rash.  Allergic/Immunologic: Negative for immunocompromised  state.  Neurological: Positive for left-sided weakness and numbness.  Negative for tremors, syncope Hematological: Does not bruise/bleed easily.  All other systems reviewed and are negative   Past Medical History:  Diagnosis Date  . Acute CVA (cerebrovascular accident) ([place]) 03/04/2020  . AMA (advanced maternal age) multigravida 35+ 11/12/2014  . Anemia   . Depression   . Hypertension   . Migraine   . Pregnant 11/12/2014  . PTSD (post-traumatic stress disorder) 05/01/2017  . Round ligament pain 11/12/2014  . Short of breath on exertion 11/12/2014  . Trichomonas infection    Past Surgical History:  Procedure Laterality Date  . CHOLECYSTECTOMY      Social History:  reports that she has never smoked. She has never used smokeless tobacco. She reports current alcohol use. She reports that she does not use drugs.   No Known Allergies  Family History  Problem Relation Age of Onset  . Diabetes Mother   . Hypertension Mother   . Kidney disease Mother   . Asthma Mother   . Hyperlipidemia Mother   . Alzheimer's disease Father   . Diabetes Father   . Kidney disease Sister   . Asthma Son   . Diabetes Sister   . Asthma Son   . ADD / ADHD Son   . Heart murmur Son   . Asthma Son      Prior to Admission medications   Medication Sig Start Date End Date Taking? Authorizing Provider  amLODipine (NORVASC) 5 MG tablet TAKE 1 TABLET(5 MG) BY MOUTH DAILY Patient taking differently: Take 5 mg by mouth daily.  07/25/17   Cresenzo-Dishmon, Joaquim Lai, CNM  dexamethasone (DECADRON) 4 MG tablet Take 1 tablet (4 mg total)  by mouth 2 (two) times daily with a meal. Patient taking differently: Take 4 mg by mouth daily.  11/03/17   Lily Kocher, PA-C  escitalopram (LEXAPRO) 20 MG tablet Take 1 tablet (20 mg total) by mouth daily. 10/11/17   Norman Clay, MD  hydrOXYzine (ATARAX/VISTARIL) 25 MG tablet Take 1 tablet (25 mg total) by mouth every 6 (six) hours as needed for anxiety. 08/17/18   Milton Ferguson,  MD  ibuprofen (ADVIL,MOTRIN) 200 MG tablet Take 200 mg by mouth every 6 (six) hours as needed for mild pain or moderate pain.    [provider]  lisinopril-hydrochlorothiazide (PRINZIDE,ZESTORETIC) 20-25 MG tablet take one tablet by mouth once daily 04/04/17   [provider]  medroxyPROGESTERone (DEPO-PROVERA) 150 MG/ML injection Inject 1 mL (150 mg total) into the muscle every 3 (three) months. 01/15/19   Florian Buff, MD  Prenatal Vit-Fe Fumarate-FA (PREPLUS) 27-1 MG TABS TAKE 1 TABLET BY MOUTH DAILY AT 12 NOON Patient taking differently: Take 1 tablet by mouth daily.  04/04/17   Estill Dooms, NP  promethazine (PHENERGAN) 25 MG tablet Take 1 tablet (25 mg total) by mouth every 6 (six) hours as needed for nausea or vomiting. 07/15/18   Mesner, Corene Cornea, MD  SUMAtriptan (IMITREX) 50 MG tablet Take 1 tablet (50 mg total) by mouth once. May repeat in 2 hours if headache persists or recurs. 02/15/17 07/15/18  Raylene Everts, MD  traZODone (DESYREL) 50 MG tablet Take 1 tablet (50 mg total) by mouth at bedtime as needed for sleep. Patient taking differently: Take 50 mg by mouth at bedtime.  10/11/17   Norman Clay, MD    Physical Exam: BP 131/82   Pulse (!) 104   Temp 99.2 F (37.3 C) (Oral)   Resp 17   Wt 102.3 kg   SpO2 98%   BMI 37.53 kg/m   . General: 44 y.o. year-old female well developed well nourished in no acute distress.  Alert and oriented x3. . Cardiovascular: Regular rate and rhythm with no rubs or gallops.  No thyromegaly or JVD noted.  No lower extremity edema. 2/4 pulses in all 4 extremities. [name] Respiratory: Clear to auscultation with no wheezes or rales. Good inspiratory effort. . Abdomen: Soft nontender nondistended with normal bowel sounds x4 quadrants. . Muskuloskeletal: No cyanosis, clubbing or edema noted bilaterally . Neuro: NIHSS 3 (left arm motor drift, but does not  Hit bed +1; left leg motor drift, but does not  Hit bed +1, sensation was less  sharp/more dull  +1), CN II-XII intact. . Skin: No ulcerative lesions noted or rashes . Psychiatry: Judgement and insight appear normal. Mood is appropriate for condition and setting          Labs on Admission:  Basic Metabolic Panel: Recent Labs  Lab 03/04/20 2038 03/04/20 2042  NA 136 142  K 3.5 3.7  CL 102 103  CO2 24  --   GLUCOSE 114* 108*  BUN 9 7  CREATININE 0.85 0.80  CALCIUM 9.3  --    Liver Function Tests: Recent Labs  Lab 03/04/20 2038  AST 20  ALT 22  ALKPHOS 47  BILITOT 0.6  PROT 8.2*  ALBUMIN 3.9   No results for input(s): LIPASE, AMYLASE in the last 168 hours. No results for input(s): AMMONIA in the last 168 hours. CBC: Recent Labs  Lab 03/04/20 2038 03/04/20 2042  WBC 12.2*  --   NEUTROABS 8.3*  --   HGB 15.4* 16.3*  HCT  46.2* 48.0*  MCV 80.9  --   PLT 301  --    Cardiac Enzymes: No results for input(s): CKTOTAL, CKMB, CKMBINDEX, TROPONINI in the last 168 hours.  BNP (last 3 results) No results for input(s): BNP in the last 8760 hours.  ProBNP (last 3 results) No results for input(s): PROBNP in the last 8760 hours.  CBG: Recent Labs  Lab 03/04/20 2033  GLUCAP 142*    Radiological Exams on Admission: CT HEAD CODE STROKE WO CONTRAST  Result Date: 03/04/2020 CLINICAL DATA:  Code stroke. Left-sided numbness beginning 9 hours ago. EXAM: CT HEAD WITHOUT CONTRAST TECHNIQUE: Contiguous axial images were obtained from the base of the skull through the vertex without intravenous contrast. COMPARISON:  None. FINDINGS: Brain: Normal appearance without evidence malformation, atrophy, old or acute infarction, mass lesion, hemorrhage, hydrocephalus or extra-axial collection. Vascular: No abnormal vascular finding. Skull: Normal Sinuses/Orbits: Clear/normal Other: None ASPECTS ([place] Stroke Program Early CT Score) - Ganglionic level infarction (caudate, lentiform nuclei, internal capsule, insula, M1-M3 cortex): 7 - Supraganglionic infarction (M4-M6  cortex): 3 Total score (0-10 with 10 being normal): 10 IMPRESSION: 1. Normal head CT. 2. ASPECTS is 10. 3. These results were called by telephone at the time of interpretation on 03/04/2020 at 8:56 pm to provider Fredia Sorrow , who verbally acknowledged these results. Electronically Signed   By: Nelson Chimes M.D.   On: 03/04/2020 20:56    EKG: I independently viewed the EKG done and my findings are as followed: NSR at 100bpm with multiple PVCs  Assessment/Plan Present on Admission: . Acute CVA (cerebrovascular accident) ([place]) . Essential hypertension  Principal Problem:   Acute CVA (cerebrovascular accident) [facility]) Active Problems:   Essential hypertension   Recurrent falls   Leukocytosis  Acute left-sided weakness and numbness associated with recurrent falls, R/O acute ischemic stroke CT of head without contrast showed normal CT Teleneurologist was consulted and recommended further stroke work-up Patient will be admitted to telemetry unit  Bilateral carotid ultrasound in the morning Echocardiogram in the morning MRI of brain without contrast in the morning Continue aspirin 81mg  po daily Continue fall precautions and neuro checks Lipid panel and hemoglobin A1c will be checked Continue PT/ST/OT eval and treat Patient passed bedside swallow eval by nursing Neurology consult placed and we shall await further recommendations  Leukocytosis possibly reactive WBC 12.2, no sign of acute infectious process at this time Continue to monitor WBC with morning labs  Essential hypertension (controlled) Permissive hypertension will be allowed at this time  DVT prophylaxis: SCDs (no indication for chemoprophylaxis at this time due to possible acute ischemic stroke)  Code Status: Full code  Family Communication: None at bedside  Disposition Plan:  Patient is from:                        home Anticipated DC to:                   Home Anticipated DC date:               1 day Anticipated  DC barriers:           Patient unstable for discharge at this time due to suspicion for acute ischemic stroke with pending further stroke work-up in the morning as well as neurology consult   Consults called: Neurology (ED team), neurology  Admission status: Observation    Bernadette Hoit MD [facility]  If 7PM-7AM, please contact night-coverage [email or web address] Password [email or web address]  03/05/2020, 1:36 AM

## 2020-03-05 ENCOUNTER — Observation Stay (HOSPITAL_COMMUNITY): Payer: Medicaid Other

## 2020-03-05 ENCOUNTER — Encounter (HOSPITAL_COMMUNITY): Payer: Self-pay | Admitting: Internal Medicine

## 2020-03-05 ENCOUNTER — Other Ambulatory Visit: Payer: Self-pay

## 2020-03-05 DIAGNOSIS — R296 Repeated falls: Secondary | ICD-10-CM

## 2020-03-05 DIAGNOSIS — I6389 Other cerebral infarction: Secondary | ICD-10-CM

## 2020-03-05 DIAGNOSIS — G500 Trigeminal neuralgia: Secondary | ICD-10-CM | POA: Diagnosis not present

## 2020-03-05 DIAGNOSIS — F331 Major depressive disorder, recurrent, moderate: Secondary | ICD-10-CM | POA: Diagnosis not present

## 2020-03-05 DIAGNOSIS — Z820 Family history of epilepsy and other diseases of the nervous system: Secondary | ICD-10-CM | POA: Diagnosis not present

## 2020-03-05 DIAGNOSIS — F4310 Post-traumatic stress disorder, unspecified: Secondary | ICD-10-CM | POA: Diagnosis not present

## 2020-03-05 DIAGNOSIS — D72829 Elevated white blood cell count, unspecified: Secondary | ICD-10-CM

## 2020-03-05 DIAGNOSIS — G8194 Hemiplegia, unspecified affecting left nondominant side: Secondary | ICD-10-CM | POA: Diagnosis not present

## 2020-03-05 DIAGNOSIS — R29703 NIHSS score 3: Secondary | ICD-10-CM | POA: Diagnosis not present

## 2020-03-05 DIAGNOSIS — Z83438 Family history of other disorder of lipoprotein metabolism and other lipidemia: Secondary | ICD-10-CM | POA: Diagnosis not present

## 2020-03-05 DIAGNOSIS — Z833 Family history of diabetes mellitus: Secondary | ICD-10-CM | POA: Diagnosis not present

## 2020-03-05 DIAGNOSIS — I69392 Facial weakness following cerebral infarction: Secondary | ICD-10-CM | POA: Diagnosis not present

## 2020-03-05 DIAGNOSIS — R0989 Other specified symptoms and signs involving the circulatory and respiratory systems: Secondary | ICD-10-CM | POA: Diagnosis not present

## 2020-03-05 DIAGNOSIS — Z841 Family history of disorders of kidney and ureter: Secondary | ICD-10-CM | POA: Diagnosis not present

## 2020-03-05 DIAGNOSIS — I10 Essential (primary) hypertension: Secondary | ICD-10-CM | POA: Diagnosis not present

## 2020-03-05 DIAGNOSIS — I639 Cerebral infarction, unspecified: Secondary | ICD-10-CM | POA: Diagnosis not present

## 2020-03-05 DIAGNOSIS — F32A Depression, unspecified: Secondary | ICD-10-CM | POA: Diagnosis not present

## 2020-03-05 DIAGNOSIS — Z825 Family history of asthma and other chronic lower respiratory diseases: Secondary | ICD-10-CM | POA: Diagnosis not present

## 2020-03-05 DIAGNOSIS — G43909 Migraine, unspecified, not intractable, without status migrainosus: Secondary | ICD-10-CM | POA: Diagnosis not present

## 2020-03-05 DIAGNOSIS — Z6837 Body mass index (BMI) 37.0-37.9, adult: Secondary | ICD-10-CM | POA: Diagnosis not present

## 2020-03-05 DIAGNOSIS — F410 Panic disorder [episodic paroxysmal anxiety] without agoraphobia: Secondary | ICD-10-CM | POA: Diagnosis not present

## 2020-03-05 DIAGNOSIS — E785 Hyperlipidemia, unspecified: Secondary | ICD-10-CM | POA: Diagnosis not present

## 2020-03-05 DIAGNOSIS — Z9114 Patient's other noncompliance with medication regimen: Secondary | ICD-10-CM | POA: Diagnosis not present

## 2020-03-05 DIAGNOSIS — Z79899 Other long term (current) drug therapy: Secondary | ICD-10-CM | POA: Diagnosis not present

## 2020-03-05 DIAGNOSIS — R531 Weakness: Secondary | ICD-10-CM

## 2020-03-05 DIAGNOSIS — I69354 Hemiplegia and hemiparesis following cerebral infarction affecting left non-dominant side: Secondary | ICD-10-CM | POA: Diagnosis not present

## 2020-03-05 DIAGNOSIS — I161 Hypertensive emergency: Secondary | ICD-10-CM | POA: Diagnosis not present

## 2020-03-05 DIAGNOSIS — Z20822 Contact with and (suspected) exposure to covid-19: Secondary | ICD-10-CM | POA: Diagnosis not present

## 2020-03-05 DIAGNOSIS — E663 Overweight: Secondary | ICD-10-CM | POA: Diagnosis not present

## 2020-03-05 DIAGNOSIS — Z8249 Family history of ischemic heart disease and other diseases of the circulatory system: Secondary | ICD-10-CM | POA: Diagnosis not present

## 2020-03-05 DIAGNOSIS — F411 Generalized anxiety disorder: Secondary | ICD-10-CM | POA: Diagnosis not present

## 2020-03-05 HISTORY — DX: Weakness: R53.1

## 2020-03-05 LAB — ECHOCARDIOGRAM COMPLETE
AR max vel: 2.12 cm2
AV Area VTI: 1.7 cm2
AV Area mean vel: 1.9 cm2
AV Mean grad: 6.1 mmHg
AV Peak grad: 11 mmHg
Ao pk vel: 1.66 m/s
Area-P 1/2: 4.52 cm2
Height: 66 in
S' Lateral: 2.69 cm
Weight: 3453.29 oz

## 2020-03-05 LAB — COMPREHENSIVE METABOLIC PANEL
ALT: 21 U/L (ref 0–44)
AST: 18 U/L (ref 15–41)
Albumin: 3.4 g/dL — ABNORMAL LOW (ref 3.5–5.0)
Alkaline Phosphatase: 40 U/L (ref 38–126)
Anion gap: 10 (ref 5–15)
BUN: 12 mg/dL (ref 6–20)
CO2: 25 mmol/L (ref 22–32)
Calcium: 8.9 mg/dL (ref 8.9–10.3)
Chloride: 103 mmol/L (ref 98–111)
Creatinine, Ser: 0.72 mg/dL (ref 0.44–1.00)
GFR calc non Af Amer: >60 mL/min (ref >60)
Glucose, Bld: 104 mg/dL — ABNORMAL HIGH (ref 70–99)
Potassium: 3.5 mmol/L (ref 3.5–5.1)
Sodium: 138 mmol/L (ref 135–145)
Total Bilirubin: 0.4 mg/dL (ref 0.3–1.2)
Total Protein: 6.9 g/dL (ref 6.5–8.1)

## 2020-03-05 LAB — LIPID PANEL
Cholesterol: 142 mg/dL (ref 0–200)
HDL: 35 mg/dL — ABNORMAL LOW (ref >40)
LDL Cholesterol: 96 mg/dL (ref 0–99)
Total CHOL/HDL Ratio: 4.1 RATIO
Triglycerides: 57 mg/dL (ref <150)
VLDL: 11 mg/dL (ref 0–40)

## 2020-03-05 LAB — PHOSPHORUS: Phosphorus: 3.8 mg/dL (ref 2.5–4.6)

## 2020-03-05 LAB — CBC
HCT: 43.1 % (ref 36.0–46.0)
Hemoglobin: 13.9 g/dL (ref 12.0–15.0)
MCH: 26.1 pg (ref 26.0–34.0)
MCHC: 32.3 g/dL (ref 30.0–36.0)
MCV: 81 fL (ref 80.0–100.0)
Platelets: 271 10*3/uL (ref 150–400)
RBC: 5.32 MIL/uL — ABNORMAL HIGH (ref 3.87–5.11)
RDW: 14.3 % (ref 11.5–15.5)
WBC: 9.3 10*3/uL (ref 4.0–10.5)
nRBC: 0 % (ref 0.0–0.2)

## 2020-03-05 LAB — PROTIME-INR
INR: 1.1 (ref 0.8–1.2)
Prothrombin Time: 13.3 seconds (ref 11.4–15.2)

## 2020-03-05 LAB — HEMOGLOBIN A1C
Hgb A1c MFr Bld: 5.6 % (ref 4.8–5.6)
Mean Plasma Glucose: 114.02 mg/dL

## 2020-03-05 LAB — MAGNESIUM: Magnesium: 2 mg/dL (ref 1.7–2.4)

## 2020-03-05 LAB — TSH: TSH: 0.283 u[IU]/mL — ABNORMAL LOW (ref 0.350–4.500)

## 2020-03-05 LAB — HIV ANTIBODY (ROUTINE TESTING W REFLEX): HIV Screen 4th Generation wRfx: NONREACTIVE

## 2020-03-05 LAB — TROPONIN I (HIGH SENSITIVITY)
Troponin I (High Sensitivity): 4 ng/L (ref <18)
Troponin I (High Sensitivity): 4 ng/L (ref <18)

## 2020-03-05 LAB — APTT: aPTT: 30 seconds (ref 24–36)

## 2020-03-05 IMAGING — US US CAROTID DUPLEX BILAT
1 series · 13 of 24 positions shown · non-contrast
Comparison: None.

CLINICAL DATA: 44-year-old female with a history of stroke

EXAM:
BILATERAL CAROTID DUPLEX ULTRASOUND
TECHNIQUE: Gray scale imaging, color Doppler and duplex ultrasound were
performed of bilateral carotid and vertebral arteries in the neck.

[Series 1: us carotid duplex bilat · 0.06mm/px · 13 of 68 slices shown]
[im 1/68]
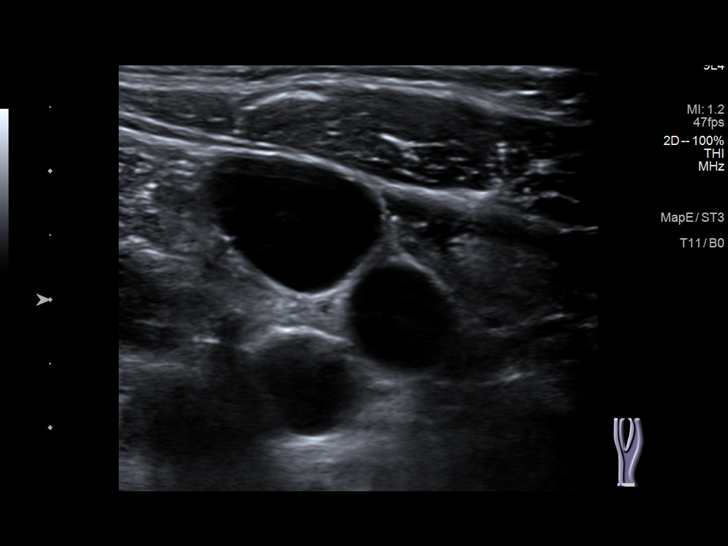
[im 6/68]
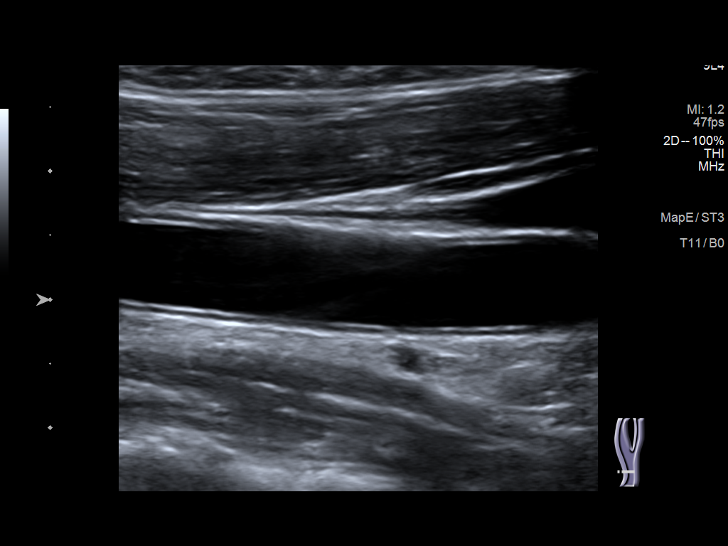
[im 12/68]
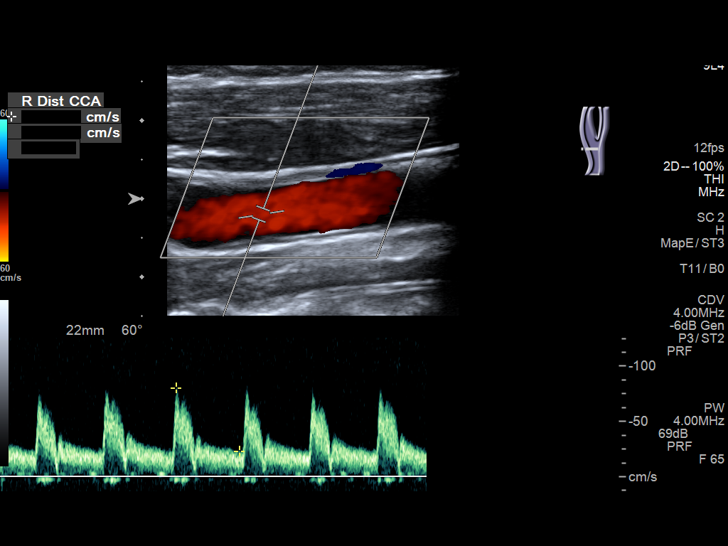
[im 18/68]
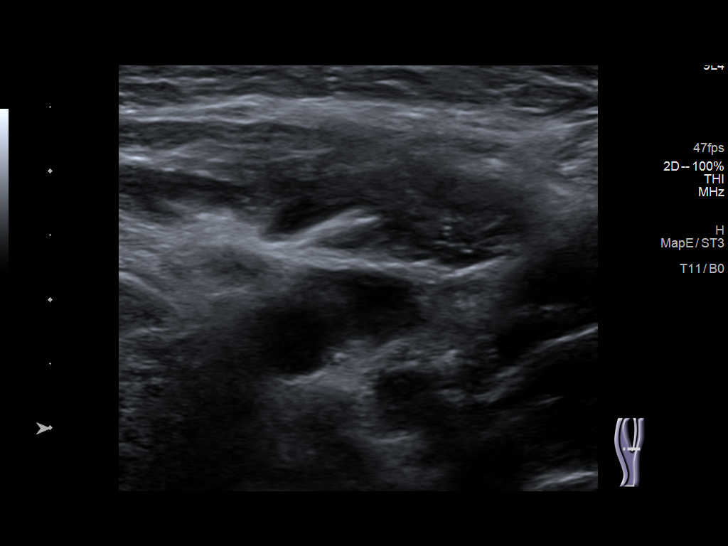
[im 24/68]
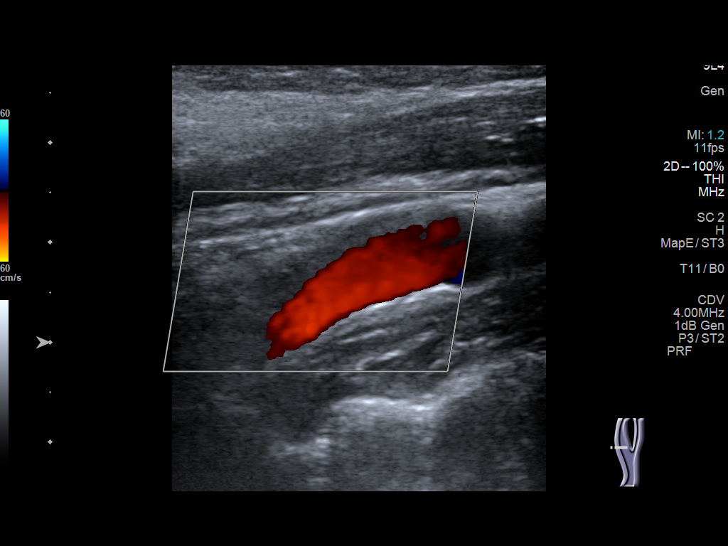
[im 30/68]
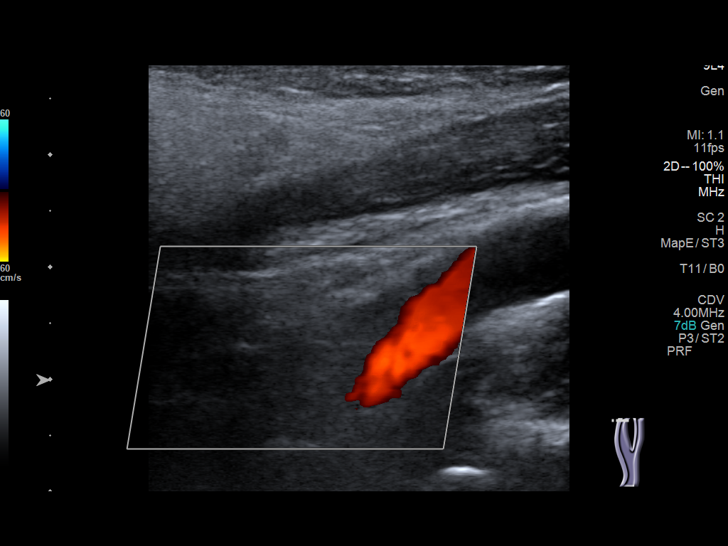
[im 35/68]
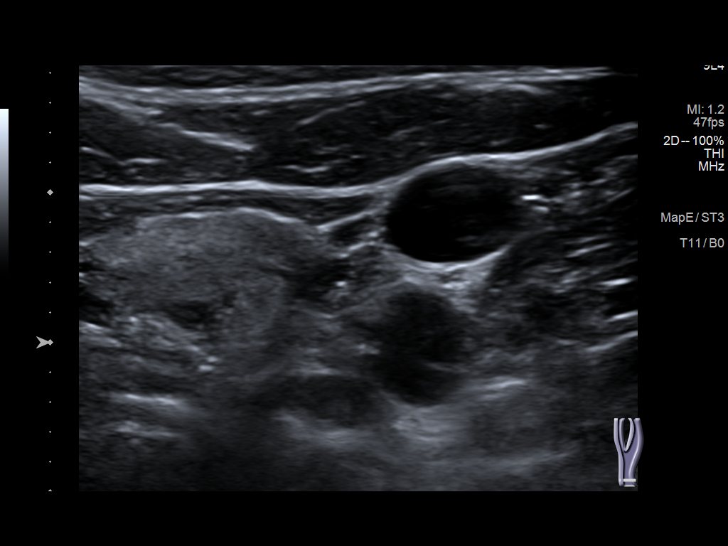
[im 38/68]
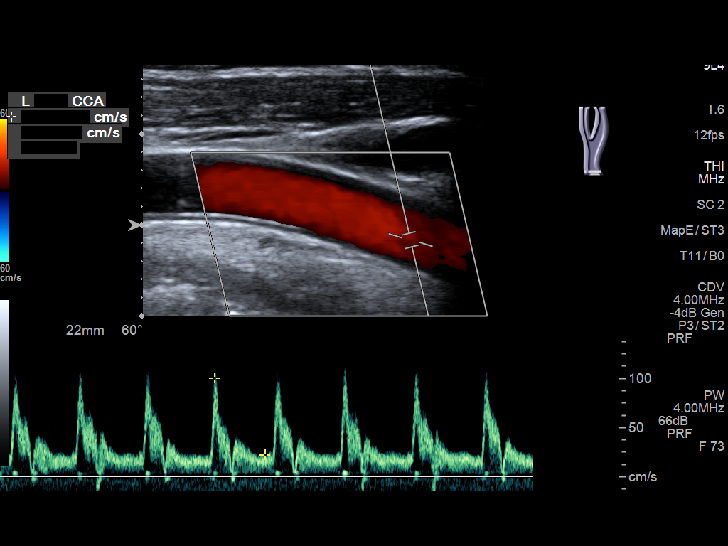
[im 44/68]
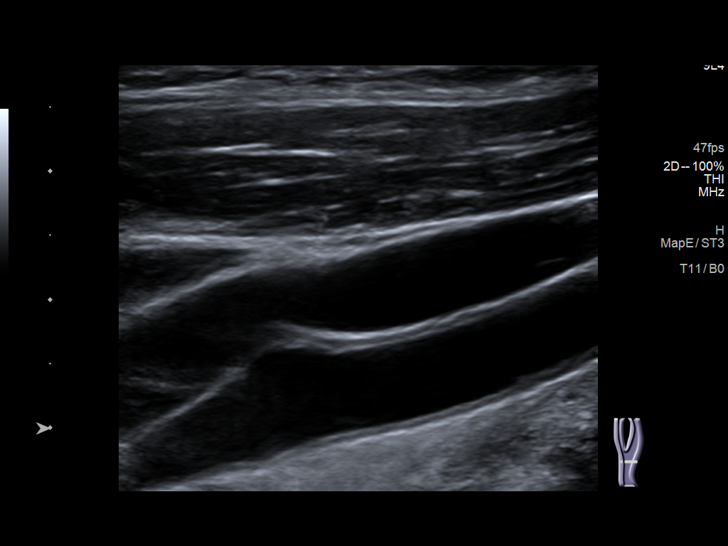
[im 50/68]
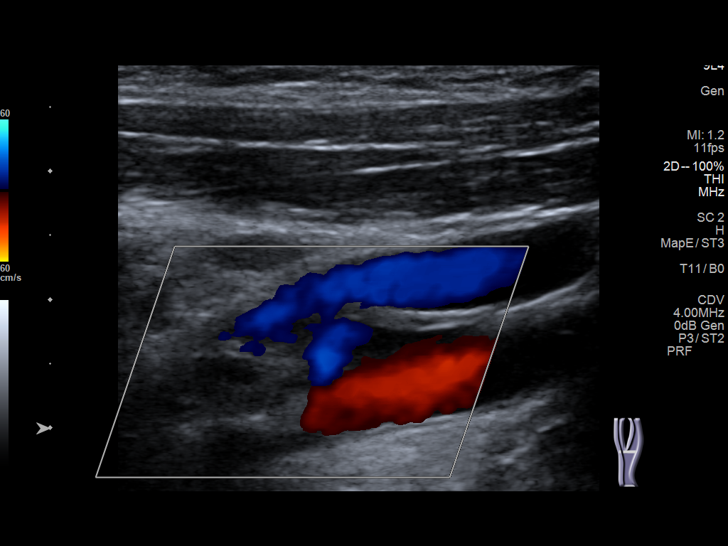
[im 56/68]
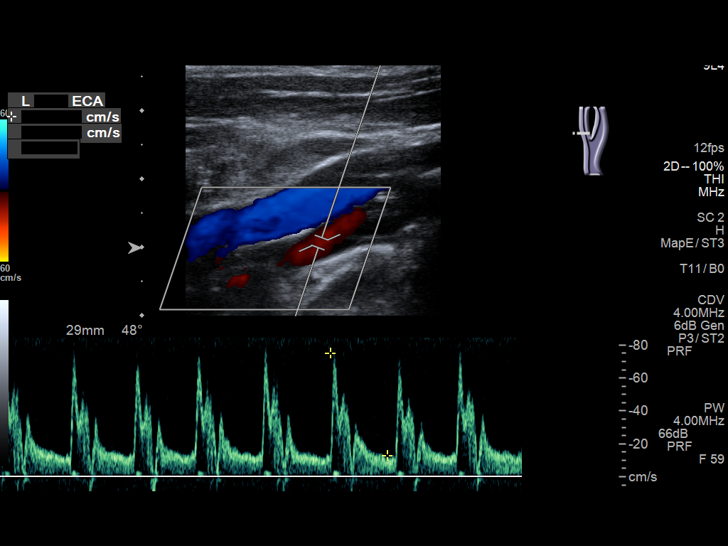
[im 62/68]
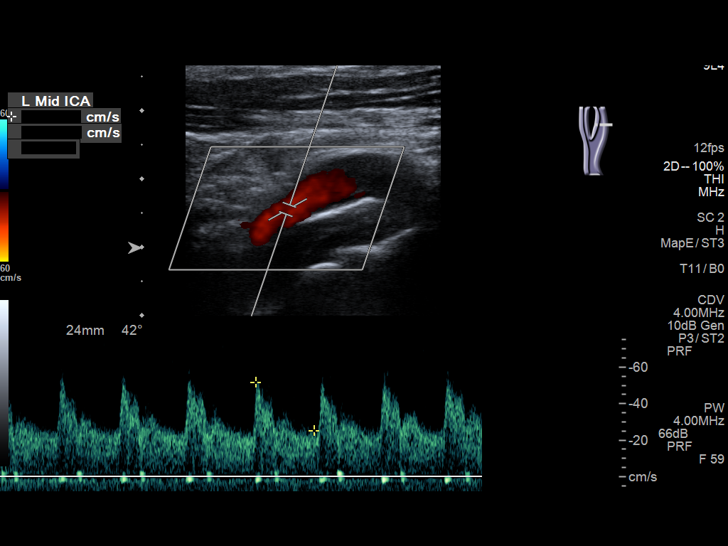
[im 68/68]
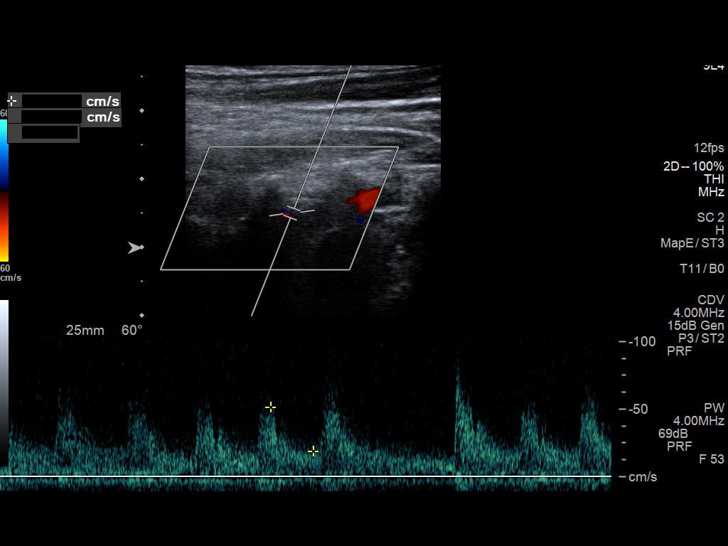

[13 of 24 positions shown; findings below may reference images not displayed]

FINDINGS: Criteria: Quantification of carotid stenosis is based on velocity
parameters that correlate the residual internal carotid diameter
with NASCET-based stenosis levels, using the diameter of the distal
internal carotid lumen as the denominator for stenosis measurement.

The following velocity measurements were obtained:

RIGHT

ICA:  Systolic 72 cm/sec, Diastolic 37 cm/sec

CCA:  93 cm/sec

SYSTOLIC ICA/CCA RATIO:

ECA:  78 cm/sec

LEFT

ICA:  Systolic 65 cm/sec, Diastolic 28 cm/sec

CCA:  103 cm/sec

SYSTOLIC ICA/CCA RATIO:

ECA:  75 cm/sec

Right Brachial SBP: Not acquired

Left Brachial SBP: Not acquired

RIGHT CAROTID ARTERY: No significant calcified disease of the right
common carotid artery. Intermediate waveform maintained. Homogeneous
plaque without significant calcifications at the right carotid
bifurcation. Low resistance waveform of the right ICA. No
significant tortuosity.

RIGHT VERTEBRAL ARTERY: Antegrade flow with low resistance waveform.

LEFT CAROTID ARTERY: No significant calcified disease of the left
common carotid artery. Intermediate waveform maintained. Homogeneous
plaque at the left carotid bifurcation without significant
calcifications. Low resistance waveform of the left ICA.

LEFT VERTEBRAL ARTERY:  Antegrade flow with low resistance waveform.
IMPRESSION: Color duplex indicates minimal homogeneous plaque, with no
hemodynamically significant stenosis by duplex criteria in the
extracranial cerebrovascular circulation.

## 2020-03-05 IMAGING — MR MR HEAD W/O CM
16 of 18 series · 30 of 48 positions shown · non-contrast
Comparison: None.

CLINICAL DATA: Left sided numbness

EXAM:
MRI HEAD WITHOUT CONTRAST
MRA HEAD WITHOUT CONTRAST
TECHNIQUE: Multiplanar, multiecho pulse sequences of the brain and surrounding
structures were obtained without intravenous contrast. Angiographic
images of the head were obtained using MRA technique without
contrast.

[Series 5: DWI · axial · 3.0mm · 0.77mm/px · z∈[-101,+25]mm · 2 of 48 slices shown (1 of 6)]
[im 1/48]
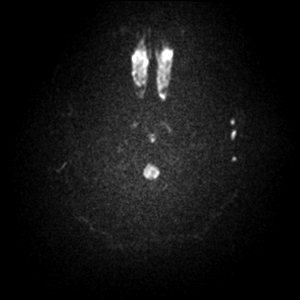
[im 48/48]
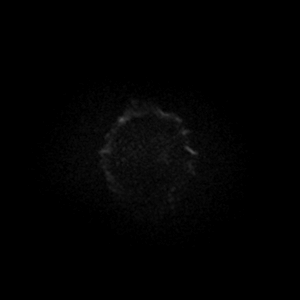

[Series 5: DWI · axial · 3.0mm · 0.77mm/px · z∈[-101,+25]mm · 2 of 48 slices shown (2 of 6)]
[im 1/48]
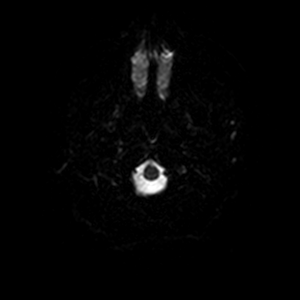
[im 48/48]
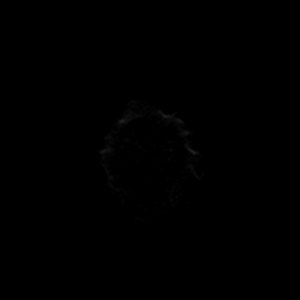

[Series 6: DWI · axial · 3.0mm · 0.77mm/px · z∈[-101,+25]mm · 2 of 48 slices shown (3 of 6)]
[im 1/48]
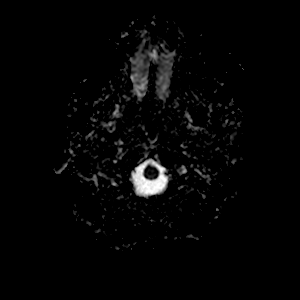
[im 48/48]
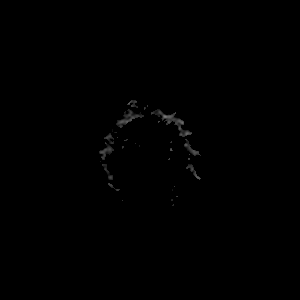

[Series 7: DWI · coronal · 5.0mm · 0.88mm/px · 2 of 28 slices shown (4 of 6)]
[im 1/28]
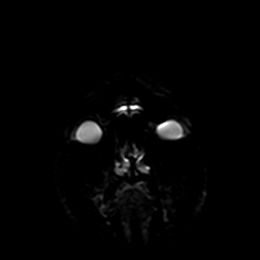
[im 28/28]
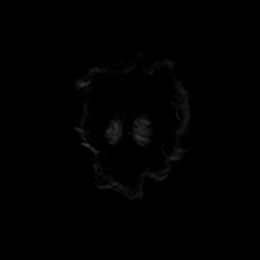

[Series 7: DWI · coronal · 5.0mm · 0.88mm/px · 2 of 28 slices shown (5 of 6)]
[im 1/28]
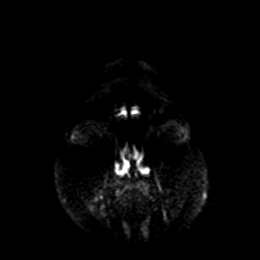
[im 28/28]
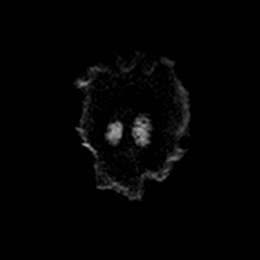

[Series 8: DWI · coronal · 5.0mm · 0.88mm/px · 2 of 28 slices shown (6 of 6)]
[im 1/28]
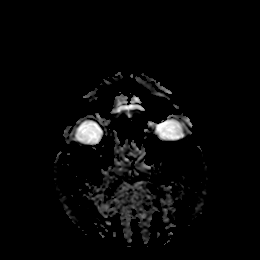
[im 28/28]
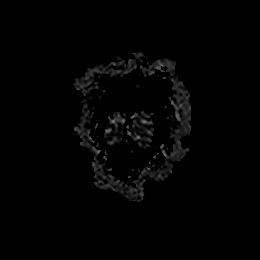

[Series 14: mag_images · axial · 3.0mm · 0.90mm/px · z∈[-118,+39]mm · 3 of 60 slices shown]
[im 1/60]
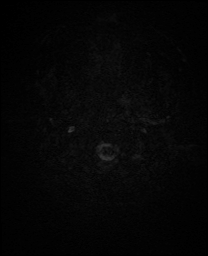
[im 30/60]
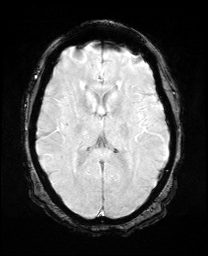
[im 60/60]
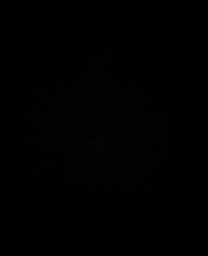

[Series 15: pha_images · axial · 3.0mm · 0.90mm/px · z∈[-116,+34]mm · 3 of 57 slices shown]
[im 1/57]
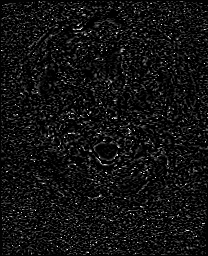
[im 29/57]
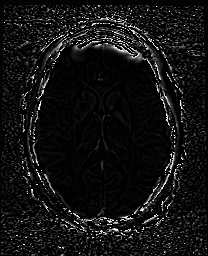
[im 57/57]
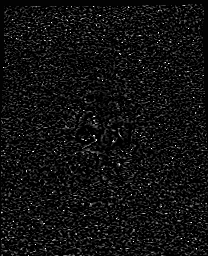

[Series 16: swi_images · axial · 3.0mm · 0.90mm/px · z∈[-118,+39]mm · 3 of 60 slices shown]
[im 1/60]
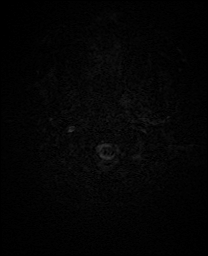
[im 30/60]
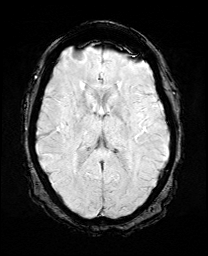
[im 60/60]
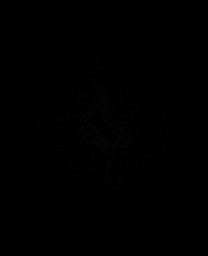

[Series 18: FLAIR · axial · 3.0mm · 0.45mm/px · z∈[-92,+13]mm · 2 of 40 slices shown]
[im 1/40]
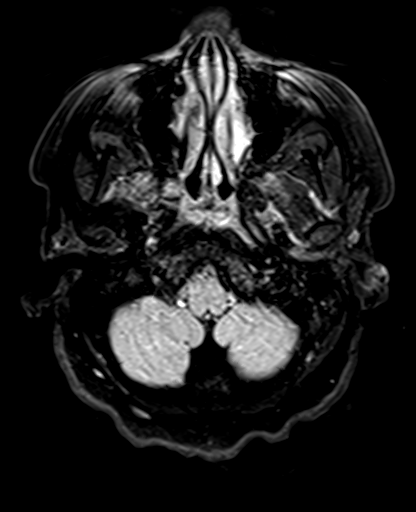
[im 40/40]
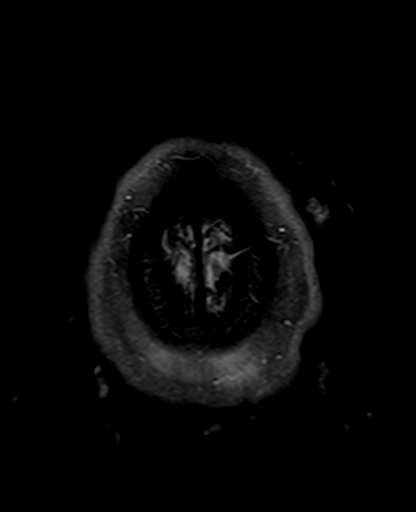

[Series 20: T2 · coronal · 5.0mm · 0.72mm/px · 2 of 28 slices shown]
[im 1/28]
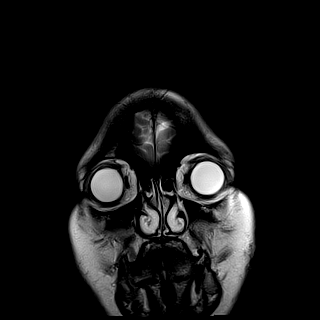
[im 28/28]
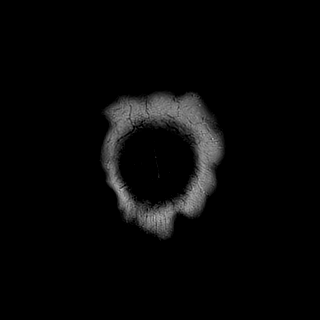

[Series 1003: tumble · 0.34mm/px · 1 of 7 slices shown (1 of 2)]
[im 1/7]
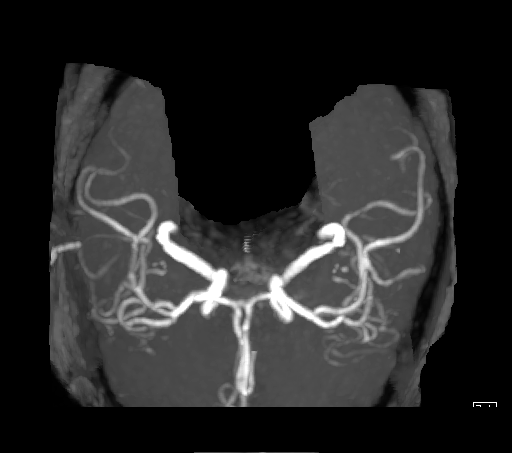

[Series 1007: rotate · 0.34mm/px · 1 of 13 slices shown (1 of 3)]
[im 1/13]
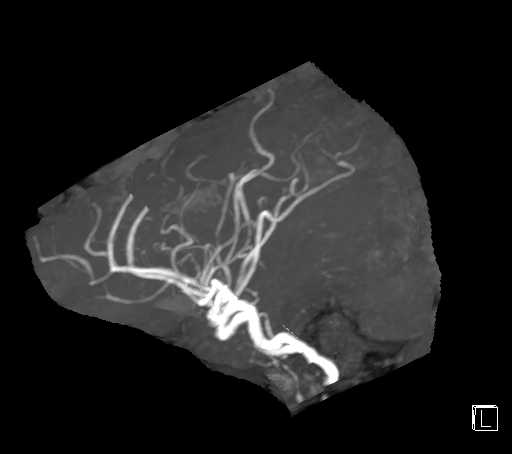

[Series 1015: rotate · 0.34mm/px · 1 of 12 slices shown (2 of 3)]
[im 1/12]
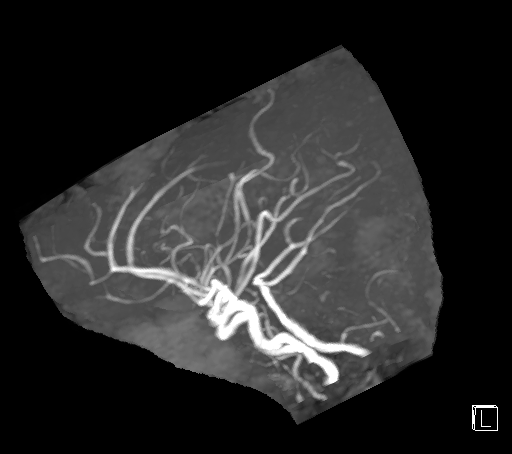

[Series 1019: tumble · 0.34mm/px · 1 of 19 slices shown (2 of 2)]
[im 1/19]
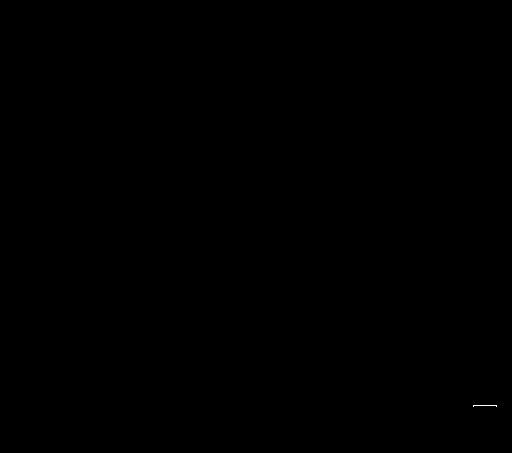

[Series 1023: rotate · 0.34mm/px · 1 of 19 slices shown (3 of 3)]
[im 1/19]
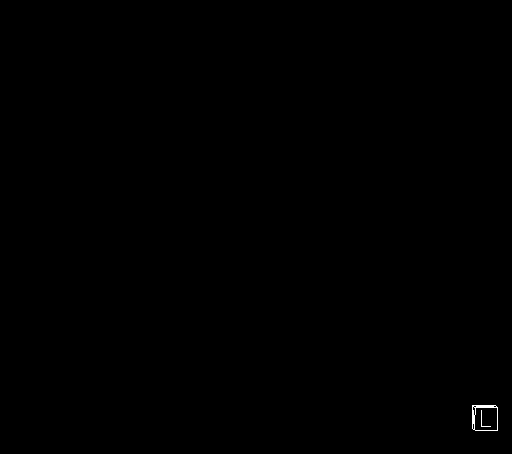

[30 of 48 positions shown; findings below may reference images not displayed]

FINDINGS: MRI HEAD

Brain: There is an 8 mm focus of restricted diffusion involving the
lateral right thalamus. There is no acute infarction or intracranial
hemorrhage. There is no intracranial mass, mass effect, or edema.
There is no hydrocephalus or extra-axial fluid collection.
Ventricles and sulci are normal in size and configuration. Patchy T2
hyperintensity in the supratentorial white matter is nonspecific may
reflect minor chronic microvascular ischemic changes.

Vascular: Major vessel flow voids at the skull base are preserved.

Skull and upper cervical spine: Normal marrow signal is preserved.

Sinuses/Orbits: Paranasal sinuses are aerated. Orbits are
unremarkable.

Other: Sella is unremarkable.  Mastoid air cells are clear.

MRA HEAD

Intracranial internal carotid arteries are patent. Middle and
anterior cerebral arteries are patent. Intracranial vertebral
arteries, basilar artery, posterior cerebral arteries are patent.
There is no significant stenosis or aneurysm.
IMPRESSION: Acute subcentimeter infarct of the lateral right thalamus.

No proximal intracranial vessel occlusion or significant stenosis.

## 2020-03-05 MED ORDER — LIDOCAINE 5 % EX PTCH
1.0000 | MEDICATED_PATCH | CUTANEOUS | Status: DC
  Administered 2020-03-05: 1 via TRANSDERMAL
  Filled 2020-03-05 (×5): qty 1

## 2020-03-05 MED ORDER — OXYCODONE HCL 5 MG PO TABS
5.0000 mg | ORAL_TABLET | ORAL | Status: DC | PRN
  Administered 2020-03-05 – 2020-03-09 (×6): 5 mg via ORAL
  Filled 2020-03-05 (×9): qty 1

## 2020-03-05 MED ORDER — ASPIRIN EC 81 MG PO TBEC
81.0000 mg | DELAYED_RELEASE_TABLET | Freq: Every day | ORAL | Status: DC
  Administered 2020-03-06: 81 mg via ORAL
  Filled 2020-03-05: qty 1

## 2020-03-05 NOTE — Progress Notes (Signed)
PROGRESS NOTE    Joyce [name]  OVZ:[id number] DOB: 04-24-1976 DOA: 03/04/2020 PCP: Patient, No Pcp Per   Brief Narrative:  This 44 years old female with PMH significant for hypertension, depression, migraine presents in the ED with left-sided numbness involving upper and lower extremities.  She has worsening symptoms, with associated weakness , She fell 3 times afterwards.  In the ED she had CT head which was negative for stroke. Tele- neurologist was consulted,  admitted for a stroke work-up.  Carotid duplex: no hemodynamically significant stenosis.  Echocardiogram unremarkable . 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 No proximal intracranial vessel occlusion or significant stenosis. PT recommended CIR.  Assessment & Plan:   Principal Problem:   Acute CVA (cerebrovascular accident) [facility]) Active Problems:   Essential hypertension   Recurrent falls   Leukocytosis   Acute left-sided weakness  Acute left-sided weakness and numbness associated with recurrent falls, R/O acute ischemic stroke CT of head without contrast showed normal CT. Teleneurologist was consulted and recommended further stroke work-up Patient  admitted to telemetry unit  Bilateral carotid ultrasound : No hemodynamically significant stenosis. Echocardiogram : LVEF 60 to 65%.  No regional wall motion abnormalities. unremarkable. MRI of brain without contrast:  Acute subcentimeter infarct of the lateral right thalamus.  Continue aspirin 81mg  po daily. Continue fall precautions and neuro checks. Hemoglobin A1c 5.6, lipid profile WNL LDL 96 PT recommended CIR. Patient passed bedside swallow eval by nursing Neurology consult placed and we shall await further recommendations  Leukocytosis possibly reactive WBC 12.2, no sign of acute infectious process at this time. Continue to monitor WBC with morning labs  Essential hypertension (controlled) Permissive hypertension will be allowed  at this time.   DVT prophylaxis: SCDs. Code Status: Full Family Communication: Husband at bed side. Disposition Plan:   Status is: Observation  The patient remains OBS appropriate and will d/c before 2 midnights.  Dispo: The patient is from: Home              Anticipated d/c is to: CIR              Anticipated d/c date is: 2 days              Patient currently is not medically stable to d/c.   Consultants:   Teleneurologist  Procedures:   Antimicrobials: Anti-infectives (From admission, onward)   None      Subjective: Patient was seen and examined at bedside.  Overnight events noted.  She is still reports having numbness but states feels better.  She denies any further chest pain.  Objective: Vitals:   03/05/20 0800 03/05/20 0830 03/05/20 0918 03/05/20 1318  BP: 121/75 107/71 124/70 (!) 142/75  Pulse:   86 69  Resp: 17 17 18 19   Temp:   98.7 F (37.1 C) 98.5 F (36.9 C)  TempSrc:   Oral Oral  SpO2:   100% 100%  Weight:   97.9 kg   Height:   5\' 6"  (1.676 m)     Intake/Output Summary (Last 24 hours) at 03/05/2020 1615 Last data filed at 03/05/2020 1300 Gross per 24 hour  Intake 480 ml  Output --  Net 480 ml   Filed Weights   03/04/20 2032 03/05/20 0918  Weight: 102.3 kg 97.9 kg    Examination:  General exam: Appears calm and comfortable.  Respiratory system: Clear to auscultation. Respiratory effort normal. Cardiovascular system: S1 & S2 heard, RRR. No JVD, murmurs, rubs, gallops or  clicks. No pedal edema. Gastrointestinal system: Abdomen is nondistended, soft and nontender. No organomegaly or masses felt. Normal bowel sounds heard. Central nervous system: Alert and oriented. No focal neurological deficits.  Numbness noted on the left side. Extremities: No edema, no cyanosis no clubbing. Skin: No rashes, lesions or ulcers Psychiatry: Judgement and insight appear normal. Mood & affect appropriate.     Data Reviewed: I have personally reviewed  following labs and imaging studies  CBC: Recent Labs  Lab 03/04/20 2038 03/04/20 2042 03/05/20 0456  WBC 12.2*  --  9.3  NEUTROABS 8.3*  --   --   HGB 15.4* 16.3* 13.9  HCT 46.2* 48.0* 43.1  MCV 80.9  --  81.0  PLT 301  --  322   Basic Metabolic Panel: Recent Labs  Lab 03/04/20 2038 03/04/20 2042 03/05/20 0456  NA 136 142 138  K 3.5 3.7 3.5  CL 102 103 103  CO2 24  --  25  GLUCOSE 114* 108* 104*  BUN 9 7 12   CREATININE 0.85 0.80 0.72  CALCIUM 9.3  --  8.9  MG  --   --  2.0  PHOS  --   --  3.8   GFR: Estimated Creatinine Clearance: 106.9 mL/min (by C-G formula based on SCr of 0.72 mg/dL). Liver Function Tests: Recent Labs  Lab 03/04/20 2038 03/05/20 0456  AST 20 18  ALT 22 21  ALKPHOS 47 40  BILITOT 0.6 0.4  PROT 8.2* 6.9  ALBUMIN 3.9 3.4*   No results for input(s): LIPASE, AMYLASE in the last 168 hours. No results for input(s): AMMONIA in the last 168 hours. Coagulation Profile: Recent Labs  Lab 03/04/20 2038 03/05/20 0456  INR 1.0 1.1   Cardiac Enzymes: No results for input(s): CKTOTAL, CKMB, CKMBINDEX, TROPONINI in the last 168 hours. BNP (last 3 results) No results for input(s): PROBNP in the last 8760 hours. HbA1C: Recent Labs    03/05/20 0456  HGBA1C 5.6   CBG: Recent Labs  Lab 03/04/20 2033  GLUCAP 142*   Lipid Profile: Recent Labs    03/05/20 0456  CHOL 142  HDL 35*  LDLCALC 96  TRIG 57  CHOLHDL 4.1   Thyroid Function Tests: Recent Labs    03/05/20 0456  TSH 0.283*   Anemia Panel: No results for input(s): VITAMINB12, FOLATE, FERRITIN, TIBC, IRON, RETICCTPCT in the last 72 hours. Sepsis Labs: No results for input(s): PROCALCITON, LATICACIDVEN in the last 168 hours.  Recent Results (from the past 240 hour(s))  Respiratory Panel by RT PCR (Flu A&B, Covid) - Nasopharyngeal Swab     Status: None   Collection Time: 03/04/20 10:34 PM   Specimen: Nasopharyngeal Swab  Result Value Ref Range Status   SARS Coronavirus 2 by  RT PCR NEGATIVE NEGATIVE Final    Comment: (NOTE) SARS-CoV-2 target nucleic acids are NOT DETECTED.  The SARS-CoV-2 RNA is generally detectable in upper respiratoy specimens during the acute phase of infection. The lowest concentration of SARS-CoV-2 viral copies this assay can detect is 131 copies/mL. A negative result does not preclude SARS-Cov-2 infection and should not be used as the sole basis for treatment or other patient management decisions. A negative result may occur with  improper specimen collection/handling, submission of specimen other than nasopharyngeal swab, presence of viral mutation(s) within the areas targeted by this assay, and inadequate number of viral copies (<131 copies/mL). A negative result must be combined with clinical observations, patient history, and epidemiological information. The expected result is Negative.  Fact Sheet for Patients:  PinkCheek.be  Fact Sheet for Healthcare Providers:  GravelBags.it  This test is no t yet approved or cleared by the [place] FDA and  has been authorized for detection and/or diagnosis of SARS-CoV-2 by FDA under an Emergency Use Authorization (EUA). This EUA will remain  in effect (meaning this test can be used) for the duration of the COVID-19 declaration under Section 564(b)(1) of the Act, 21 U.S.C. section 360bbb-3(b)(1), unless the authorization is terminated or revoked sooner.     Influenza A by PCR NEGATIVE NEGATIVE Final   Influenza B by PCR NEGATIVE NEGATIVE Final    Comment: (NOTE) The Xpert Xpress SARS-CoV-2/FLU/RSV assay is intended as an aid in  the diagnosis of influenza from Nasopharyngeal swab specimens and  should not be used as a sole basis for treatment. Nasal washings and  aspirates are unacceptable for Xpert Xpress SARS-CoV-2/FLU/RSV  testing.  Fact Sheet for Patients: PinkCheek.be  Fact Sheet for  Healthcare Providers: GravelBags.it  This test is not yet approved or cleared by the [place] FDA and  has been authorized for detection and/or diagnosis of SARS-CoV-2 by  FDA under an Emergency Use Authorization (EUA). This EUA will remain  in effect (meaning this test can be used) for the duration of the  Covid-19 declaration under Section 564(b)(1) of the Act, 21  U.S.C. section 360bbb-3(b)(1), unless the authorization is  terminated or revoked. Performed at [facility], [address], [place],  [address]      Radiology Studies: MR ANGIO HEAD WO CONTRAST  Result Date: 03/05/2020 CLINICAL DATA:  Left sided numbness EXAM: MRI HEAD WITHOUT CONTRAST MRA HEAD WITHOUT CONTRAST TECHNIQUE: Multiplanar, multiecho pulse sequences of the brain and surrounding structures were obtained without intravenous contrast. Angiographic images of the head were obtained using MRA technique without contrast. COMPARISON:  None. FINDINGS: MRI HEAD Brain: There is an 8 mm focus of restricted diffusion involving the lateral right thalamus. There is no acute infarction or intracranial hemorrhage. There is no intracranial mass, mass effect, or edema. There is no hydrocephalus or extra-axial fluid collection. Ventricles and sulci are normal in size and configuration. Patchy T2 hyperintensity in the supratentorial white matter is nonspecific may reflect minor chronic microvascular ischemic changes. Vascular: Major vessel flow voids at the skull base are preserved. Skull and upper cervical spine: Normal marrow signal is preserved. Sinuses/Orbits: Paranasal sinuses are aerated. Orbits are unremarkable. Other: Sella is unremarkable.  Mastoid air cells are clear. MRA HEAD Intracranial internal carotid arteries are patent. Middle and anterior cerebral arteries are patent. Intracranial vertebral arteries, basilar artery, posterior cerebral arteries are patent. There is no significant stenosis  or aneurysm. IMPRESSION: Acute subcentimeter infarct of the lateral right thalamus. No proximal intracranial vessel occlusion or significant stenosis. Electronically Signed   By: Macy Mis M.D.   On: 03/05/2020 12:40   MR BRAIN WO CONTRAST  Result Date: 03/05/2020 CLINICAL DATA:  Left sided numbness EXAM: MRI HEAD WITHOUT CONTRAST MRA HEAD WITHOUT CONTRAST TECHNIQUE: Multiplanar, multiecho pulse sequences of the brain and surrounding structures were obtained without intravenous contrast. Angiographic images of the head were obtained using MRA technique without contrast. COMPARISON:  None. FINDINGS: MRI HEAD Brain: There is an 8 mm focus of restricted diffusion involving the lateral right thalamus. There is no acute infarction or intracranial hemorrhage. There is no intracranial mass, mass effect, or edema. There is no hydrocephalus or extra-axial fluid collection. Ventricles and sulci are normal in size and configuration. Patchy T2 hyperintensity  in the supratentorial white matter is nonspecific may reflect minor chronic microvascular ischemic changes. Vascular: Major vessel flow voids at the skull base are preserved. Skull and upper cervical spine: Normal marrow signal is preserved. Sinuses/Orbits: Paranasal sinuses are aerated. Orbits are unremarkable. Other: Sella is unremarkable.  Mastoid air cells are clear. MRA HEAD Intracranial internal carotid arteries are patent. Middle and anterior cerebral arteries are patent. Intracranial vertebral arteries, basilar artery, posterior cerebral arteries are patent. There is no significant stenosis or aneurysm. IMPRESSION: Acute subcentimeter infarct of the lateral right thalamus. No proximal intracranial vessel occlusion or significant stenosis. Electronically Signed   By: Macy Mis M.D.   On: 03/05/2020 12:40   US Carotid Bilateral  Result Date: 03/05/2020 CLINICAL DATA:  44 year old female with a history of stroke EXAM: BILATERAL CAROTID DUPLEX  ULTRASOUND TECHNIQUE: Pearline Cables scale imaging, color Doppler and duplex ultrasound were performed of bilateral carotid and vertebral arteries in the neck. COMPARISON:  None. FINDINGS: Criteria: Quantification of carotid stenosis is based on velocity parameters that correlate the residual internal carotid diameter with NASCET-based stenosis levels, using the diameter of the distal internal carotid lumen as the denominator for stenosis measurement. The following velocity measurements were obtained: RIGHT ICA:  Systolic 72 cm/sec, Diastolic 37 cm/sec CCA:  93 cm/sec SYSTOLIC ICA/CCA RATIO:  0.8 ECA:  78 cm/sec LEFT ICA:  Systolic 65 cm/sec, Diastolic 28 cm/sec CCA:  767 cm/sec SYSTOLIC ICA/CCA RATIO:  0.6 ECA:  75 cm/sec Right Brachial SBP: Not acquired Left Brachial SBP: Not acquired RIGHT CAROTID ARTERY: No significant calcified disease of the right common carotid artery. Intermediate waveform maintained. Homogeneous plaque without significant calcifications at the right carotid bifurcation. Low resistance waveform of the right ICA. No significant tortuosity. RIGHT VERTEBRAL ARTERY: Antegrade flow with low resistance waveform. LEFT CAROTID ARTERY: No significant calcified disease of the left common carotid artery. Intermediate waveform maintained. Homogeneous plaque at the left carotid bifurcation without significant calcifications. Low resistance waveform of the left ICA. LEFT VERTEBRAL ARTERY:  Antegrade flow with low resistance waveform. IMPRESSION: Color duplex indicates minimal homogeneous plaque, with no hemodynamically significant stenosis by duplex criteria in the extracranial cerebrovascular circulation. Signed, Dulcy Fanny. Dellia Nims, RPVI [facility] Radiology Electronically Signed   By: Corrie Mckusick D.O.   On: 03/05/2020 13:27   ECHOCARDIOGRAM COMPLETE  Result Date: 03/05/2020    ECHOCARDIOGRAM REPORT   Patient Name:   ANDRINA LOCKEN Date of Exam:  03/05/2020 Medical Rec #:  [id number]          Height:       66.0 in Accession #:    [id number]         Weight:       215.8 lb Date of Birth:  07/30/1975          BSA:          2.066 m Patient Age:    78 years           BP:           124/70 mmHg Patient Gender: F                  HR:           86 bpm. Exam Location:  [facility] Procedure: 2D Echo Indications:    Stroke 434.91 / I163.9  History:        Patient has no prior history of Echocardiogram examinations.  Stroke; Risk Factors:Hypertension. 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  Sonographer:    Leavy Cella RDCS (AE) Referring Phys: [id number] OLADAPO ADEFESO IMPRESSIONS  1. Left ventricular ejection fraction, by estimation, is 60 to 65%. The left ventricle has normal function. The left ventricle has no regional wall motion abnormalities. There is mild left ventricular hypertrophy. Left ventricular diastolic parameters are indeterminate.  2. Right ventricular systolic function is normal. The right ventricular size is normal.  3. The mitral valve is normal in structure. No evidence of mitral valve regurgitation. No evidence of mitral stenosis.  4. The aortic valve is tricuspid. Aortic valve regurgitation is not visualized. No aortic stenosis is present.  5. The inferior vena cava is normal in size with greater than 50% respiratory variability, suggesting right atrial pressure of 3 mmHg. FINDINGS  Left Ventricle: Left ventricular ejection fraction, by estimation, is 60 to 65%. The left ventricle has normal function. The left ventricle has no regional wall motion abnormalities. The left ventricular internal cavity size was normal in size. There is  mild left ventricular hypertrophy. Left ventricular diastolic parameters are indeterminate. Right Ventricle: The right ventricular size is normal. No increase in right ventricular wall thickness. Right ventricular systolic function is normal. Left Atrium: Left atrial size was normal in size. Right  Atrium: Right atrial size was normal in size. Pericardium: There is no evidence of pericardial effusion. Mitral Valve: The mitral valve is normal in structure. No evidence of mitral valve regurgitation. No evidence of mitral valve stenosis. Tricuspid Valve: The tricuspid valve is normal in structure. Tricuspid valve regurgitation is not demonstrated. No evidence of tricuspid stenosis. Aortic Valve: The aortic valve is tricuspid. Aortic valve regurgitation is not visualized. No aortic stenosis is present. Aortic valve mean gradient measures 6.1 mmHg. Aortic valve peak gradient measures 11.0 mmHg. Aortic valve area, by VTI measures 1.70  cm. Pulmonic Valve: The pulmonic valve was not well visualized. Pulmonic valve regurgitation is not visualized. No evidence of pulmonic stenosis. Aorta: The aortic root is normal in size and structure. Pulmonary Artery: Indeterminant PASP, inadequate TR jet. Venous: The inferior vena cava is normal in size with greater than 50% respiratory variability, suggesting right atrial pressure of 3 mmHg. IAS/Shunts: No atrial level shunt detected by color flow Doppler.  LEFT VENTRICLE PLAX 2D LVIDd:         3.81 cm  Diastology LVIDs:         2.69 cm  LV e' medial:    7.07 cm/s LV PW:         1.10 cm  LV E/e' medial:  9.8 LV IVS:        1.07 cm  LV e' lateral:   8.27 cm/s LVOT diam:     2.00 cm  LV E/e' lateral: 8.4 LV SV:         53 LV SV Index:   26 LVOT Area:     3.14 cm  RIGHT VENTRICLE RV S prime:     12.70 cm/s TAPSE (M-mode): 1.9 cm LEFT ATRIUM           Index       RIGHT ATRIUM          Index LA diam:      3.00 cm 1.45 cm/m  RA Area:     9.53 cm LA Vol (A2C): 24.1 ml 11.67 ml/m RA Volume:   20.00 ml 9.68 ml/m  LA Vol (A4C): 34.0 ml 16.46 ml/m  AORTIC VALVE AV Area (Vmax):    2.12 cm AV Area (Vmean):   1.90 cm AV Area (VTI):     1.70 cm AV Vmax:           166.14 cm/s AV Vmean:          118.985 cm/s AV VTI:            0.314 m AV Peak Grad:      11.0 mmHg AV Mean Grad:      6.1  mmHg LVOT Vmax:         112.13 cm/s LVOT Vmean:        71.874 cm/s LVOT VTI:          0.170 m LVOT/AV VTI ratio: 0.54  AORTA Ao Root diam: 2.40 cm MITRAL VALVE MV Area (PHT): 4.52 cm    SHUNTS MV Decel Time: 168 msec    Systemic VTI:  0.17 m MV E velocity: 69.50 cm/s  Systemic Diam: 2.00 cm MV A velocity: 71.70 cm/s MV E/A ratio:  0.97 Carlyle Dolly MD Electronically signed by Carlyle Dolly MD Signature Date/Time: 03/05/2020/10:47:07 AM    Final    CT HEAD CODE STROKE WO CONTRAST  Result Date: 03/04/2020 CLINICAL DATA:  Code stroke. Left-sided numbness beginning 9 hours ago. EXAM: CT HEAD WITHOUT CONTRAST TECHNIQUE: Contiguous axial images were obtained from the base of the skull through the vertex without intravenous contrast. COMPARISON:  None. FINDINGS: Brain: Normal appearance without evidence malformation, atrophy, old or acute infarction, mass lesion, hemorrhage, hydrocephalus or extra-axial collection. Vascular: No abnormal vascular finding. Skull: Normal Sinuses/Orbits: Clear/normal Other: None ASPECTS ([place] Stroke Program Early CT Score) - Ganglionic level infarction (caudate, lentiform nuclei, internal capsule, insula, M1-M3 cortex): 7 - Supraganglionic infarction (M4-M6 cortex): 3 Total score (0-10 with 10 being normal): 10 IMPRESSION: 1. Normal head CT. 2. ASPECTS is 10. 3. These results were called by telephone at the time of interpretation on 03/04/2020 at 8:56 pm to provider Fredia Sorrow , who verbally acknowledged these results. Electronically Signed   By: Nelson Chimes M.D.   On: 03/04/2020 20:56    Scheduled Meds: . aspirin EC  81 mg Oral Daily  . lidocaine  1 patch Transdermal Q24H   Continuous Infusions:   LOS: 0 days    Time spent: 35 mins.    Shawna Clamp, MD [facility]   If 7PM-7AM, please contact night-coverage

## 2020-03-05 NOTE — ED Notes (Signed)
Pt c/o left sided chest pain that is stabbing and burning. EKG done and admitting doctor notified. Admitting doctor will see pt soon for the symptoms.

## 2020-03-05 NOTE — Plan of Care (Signed)
  Problem: Acute Rehab PT Goals(only PT should resolve) Goal: Pt Will Go Supine/Side To Sit Outcome: Progressing Flowsheets (Taken 03/05/2020 1445) Pt will go Supine/Side to Sit: with min guard assist Goal: Patient Will Transfer Sit To/From Stand Outcome: Progressing Flowsheets (Taken 03/05/2020 1445) Patient will transfer sit to/from stand:  with min guard assist  with minimal assist Goal: Pt Will Transfer Bed To Chair/Chair To Bed Outcome: Progressing Flowsheets (Taken 03/05/2020 1445) Pt will Transfer Bed to Chair/Chair to Bed:  min guard assist  with min assist Goal: Pt Will Ambulate Outcome: Progressing Flowsheets (Taken 03/05/2020 1445) Pt will Ambulate:  75 feet  with minimal assist  with rolling walker   2:45 PM, 03/05/20 Lonell Grandchild, MPT Physical Therapist with [facility] [phone] office [phone] mobile phone

## 2020-03-05 NOTE — Progress Notes (Signed)
Nurse called due to patient complaining of left costal rib pain.  EKG was done and showed normal sinus rhythm at rate of 90 bpm.  At bedside, patient presents with tender to palpation left-sided pleuritic pain which improved on lying in bed in right lateral decubitus position. Oxycodone 5 mg every 4 hours as needed and lidocaine patch will be provided Troponin will be checked to ensure no cardiac event.

## 2020-03-05 NOTE — ED Notes (Signed)
Report called to Amber

## 2020-03-05 NOTE — ED Notes (Signed)
Assumed care of pt at 0700 

## 2020-03-05 NOTE — Evaluation (Signed)
Physical Therapy Evaluation Patient Details Name: Joyce [name] MRN: [id number] DOB: Sep 19, 1975 Today's Date: 03/05/2020   History of Present Illness  Joyce [name] is a 44 y.o. female with medical history significant for hypertension, depression and migraine who presents to the emergency department due to left-sided numbness of upper and lower extremities which started around 12 noon.  Patient states that she took a nap with the hope that symptoms will go away, but on waking up, she had difficulty in walking and she states that she fell 3 times without hurting herself, so she decided to go to the ED for further evaluation and management, patient drove herself to the ED, she denies chest pain, shortness of breath, headache, nausea, vomiting or abdominal pain.    Clinical Impression  Patient demonstrates slow labored movement for sitting up at bedside with frequent leaning backwards when unsupported by BUE, has difficulty controlling LLE especially dorsiflexing ankle, can dorsiflex through full ROM with tactile/verbal cueing, but limited mostly due to spasticity, very unsteady on feet and at high risk for falls due to left sided weakness, decreased proprioception due to severe numbness/tingling sensation, has difficulty holding onto RW with left had and demonstrates slow labored cadence requiring frequent tactile/verbal cueing to keep left hand on RW and dorsiflexing left foot when taking steps.  Patient tolerated sitting up in chair with her son in room after therapy. Patient will benefit from continued physical therapy in hospital and recommended venue below to increase strength, balance, endurance for safe ADLs and gait.      Follow Up Recommendations CIR    Equipment Recommendations  Rolling walker with 5" wheels    Recommendations for Other Services       Precautions / Restrictions Precautions Precautions: Fall Restrictions Weight Bearing Restrictions: No      Mobility   Bed Mobility Overal bed mobility: Needs Assistance Bed Mobility: Supine to Sit     Supine to sit: Min assist;Mod assist     General bed mobility comments: slow labored movement with frequent falling backwards  Transfers Overall transfer level: Needs assistance   Transfers: Sit to/from Stand;Stand Pivot Transfers Sit to Stand: Min assist;Mod assist Stand pivot transfers: Min assist;Mod assist       General transfer comment: unsteady on LLE due to weakness, limited use of LUE mostly due to c/o severe numbness/tingling sensation  Ambulation/Gait Ambulation/Gait assistance: Min assist;Mod assist Gait Distance (Feet): 45 Feet Assistive device: Rolling walker (2 wheeled) Gait Pattern/deviations: Decreased step length - right;Decreased step length - left;Decreased stride length;Decreased dorsiflexion - left;Step-to pattern Gait velocity: decreased   General Gait Details: very unsteady with poor tolerance for weightbearing on LLE due to weakness, required use of RW demonstrating slow labored cadence, decreased left dorsiflexion, diffiuclty holding onto RW left hand and limited due to fatigue  Stairs            Wheelchair Mobility    Modified Rankin (Stroke Patients Only)       Balance Overall balance assessment: Needs assistance Sitting-balance support: Feet supported;No upper extremity supported Sitting balance-Leahy Scale: Poor Sitting balance - Comments: fair/poor without BUE support, fair using BUE support Postural control: Posterior lean Standing balance support: During functional activity;No upper extremity supported Standing balance-Leahy Scale: Poor Standing balance comment: fair/poor using RW mostly due to left hand slipping off walker                             Pertinent Vitals/Pain  Pain Assessment: Faces Faces Pain Scale: Hurts even more Pain Location: left side, hand wrist mostly due to tingling, needles sensation Pain Descriptors /  Indicators: Numbness;Pins and needles;Tingling Pain Intervention(s): Limited activity within patient's tolerance;Monitored during session    [place] expects to be discharged to:: Private residence Living Arrangements: Spouse/significant other;Children Available Help at Discharge: Family;Available 24 hours/day Type of Home: House Home Access: Stairs to enter Entrance Stairs-Rails: None Entrance Stairs-Number of Steps: 4 Home Layout: Two level;Bed/bath upstairs Home Equipment: None      Prior Function Level of Independence: Independent         Comments: community ambulator, drives     Hand Dominance   Dominant Hand: Right    Extremity/Trunk Assessment   Upper Extremity Assessment Upper Extremity Assessment: Defer to OT evaluation    Lower Extremity Assessment Lower Extremity Assessment: Generalized weakness;RLE deficits/detail;LLE deficits/detail RLE Deficits / Details: grossly 5/5 RLE Sensation: WNL RLE Coordination: WNL LLE Deficits / Details: grossly 3/5 LLE Sensation: decreased light touch;decreased proprioception LLE Coordination: decreased fine motor    Cervical / Trunk Assessment Cervical / Trunk Assessment: Normal  Communication   Communication: No difficulties  Cognition Arousal/Alertness: Awake/alert Behavior During Therapy: WFL for tasks assessed/performed Overall Cognitive Status: Within Functional Limits for tasks assessed                                        General Comments      Exercises     Assessment/Plan    PT Assessment Patient needs continued PT services  PT Problem List Decreased strength;Decreased activity tolerance;Decreased balance;Decreased mobility       PT Treatment Interventions DME instruction;Gait training;Stair training;Functional mobility training;Therapeutic activities;Therapeutic exercise;Patient/family education;Balance training;Neuromuscular re-education    PT Goals (Current  goals can be found in the Care Plan section)  Acute Rehab PT Goals Patient Stated Goal: return home after rehab PT Goal Formulation: With patient/family Time For Goal Achievement: 03/19/20 Potential to Achieve Goals: Good    Frequency Min 6X/week   Barriers to discharge        Co-evaluation               AM-PAC PT "6 Clicks" Mobility  Outcome Measure Help needed turning from your back to your side while in a flat bed without using bedrails?: A Little Help needed moving from lying on your back to sitting on the side of a flat bed without using bedrails?: A Lot Help needed moving to and from a bed to a chair (including a wheelchair)?: A Lot Help needed standing up from a chair using your arms (e.g., wheelchair or bedside chair)?: A Little Help needed to walk in hospital room?: A Lot Help needed climbing 3-5 steps with a railing? : Total 6 Click Score: 13    End of Session   Activity Tolerance: Patient tolerated treatment well;Patient limited by fatigue Patient left: in chair;with call bell/phone within reach;with family/visitor present Nurse Communication: Mobility status PT Visit Diagnosis: Unsteadiness on feet (R26.81);Other abnormalities of gait and mobility (R26.89);Muscle weakness (generalized) (M62.81);Hemiplegia and hemiparesis Hemiplegia - Right/Left: Left Hemiplegia - dominant/non-dominant: Non-dominant Hemiplegia - caused by: Cerebral infarction    Time: 3716-9678 PT Time Calculation (min) (ACUTE ONLY): 32 min   Charges:   PT Evaluation $PT Eval Moderate Complexity: 1 Mod PT Treatments $Therapeutic Activity: 23-37 mins        2:43 PM, 03/05/20 Lonell Grandchild, MPT  Physical Therapist with [facility] [phone] office [phone] mobile phone

## 2020-03-05 NOTE — Progress Notes (Signed)
*  PRELIMINARY RESULTS* Echocardiogram 2D Echocardiogram has been performed.  Joyce [name] 03/05/2020, 9:58 AM

## 2020-03-06 LAB — SEDIMENTATION RATE: Sed Rate: 9 mm/hr (ref 0–22)

## 2020-03-06 LAB — TSH: TSH: 0.523 u[IU]/mL (ref 0.350–4.500)

## 2020-03-06 LAB — C-REACTIVE PROTEIN: CRP: 0.6 mg/dL (ref <1.0)

## 2020-03-06 LAB — VITAMIN B12: Vitamin B-12: 363 pg/mL (ref 180–914)

## 2020-03-06 MED ORDER — ATORVASTATIN CALCIUM 40 MG PO TABS
80.0000 mg | ORAL_TABLET | Freq: Every day | ORAL | Status: DC
  Administered 2020-03-06 – 2020-03-10 (×5): 80 mg via ORAL
  Filled 2020-03-06 (×5): qty 2

## 2020-03-06 MED ORDER — ASPIRIN EC 325 MG PO TBEC
325.0000 mg | DELAYED_RELEASE_TABLET | Freq: Every day | ORAL | Status: DC
  Administered 2020-03-07 – 2020-03-10 (×4): 325 mg via ORAL
  Filled 2020-03-06 (×4): qty 1

## 2020-03-06 MED ORDER — TOPIRAMATE 25 MG PO TABS
25.0000 mg | ORAL_TABLET | Freq: Every day | ORAL | Status: DC
  Administered 2020-03-06 – 2020-03-10 (×5): 25 mg via ORAL
  Filled 2020-03-06 (×5): qty 1

## 2020-03-06 MED ORDER — CLOPIDOGREL BISULFATE 75 MG PO TABS
75.0000 mg | ORAL_TABLET | Freq: Every day | ORAL | Status: DC
  Administered 2020-03-07 – 2020-03-10 (×4): 75 mg via ORAL
  Filled 2020-03-06 (×4): qty 1

## 2020-03-06 NOTE — Progress Notes (Signed)
PROGRESS NOTE    Joyce [name]  FMB:[id number] DOB: 05-Nov-1975 DOA: 03/04/2020 PCP: Patient, No Pcp Per   Brief Narrative:  This 44 years old female with PMH significant for hypertension, depression, migraine presents in the ED with left-sided numbness involving upper and lower extremities.  She has worsening symptoms, with associated weakness , She fell 3 times afterwards.  In the ED she had CT head which was negative for stroke. Tele- neurologist was consulted,  admitted for a stroke work-up.  Carotid duplex: no hemodynamically significant stenosis.  Echocardiogram unremarkable . 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 No proximal intracranial vessel occlusion or significant stenosis. PT recommended CIR.  Assessment & Plan:   Principal Problem:   Acute CVA (cerebrovascular accident) [facility]) Active Problems:   Essential hypertension   Recurrent falls   Leukocytosis   Acute left-sided weakness  Acute left-sided weakness and numbness associated with recurrent falls, R/O acute ischemic stroke CT of head without contrast showed normal CT. Teleneurologist was consulted and recommended further stroke work-up Patient  admitted to telemetry unit  Bilateral carotid ultrasound : No hemodynamically significant stenosis. Echocardiogram : LVEF 60 to 65%.  No regional wall motion abnormalities. unremarkable. MRI of brain without contrast:  Acute subcentimeter infarct of the lateral right thalamus.  Continue aspirin 81mg  po daily. Continue fall precautions and neuro checks. Hemoglobin A1c 5.6, lipid profile WNL LDL 96 PT recommended CIR. Patient passed bedside swallow eval by nursing Neurology consult placed and we shall await further recommendations  Leukocytosis possibly reactive >> Resolved. WBC 12.2, no sign of acute infectious process at this time. Continue to monitor WBC with morning labs  Essential hypertension (controlled) Permissive hypertension will  be allowed at this time.   DVT prophylaxis: SCDs. Code Status: Full Family Communication: Husband at bed side. Disposition Plan:   Status is: Inpatient  Remains inpatient appropriate because:Inpatient level of care appropriate due to severity of illness   Dispo: The patient is from: Home              Anticipated d/c is to: CIR              Anticipated d/c date is: 2 days              Patient currently is not medically stable to d/c.  Consultants:   Teleneurologist  Procedures:   Antimicrobials: Anti-infectives (From admission, onward)   None      Subjective: Patient was seen and examined at bedside.  Overnight events noted.  She reports feeling better,  states numbness has improved but is still present.   She still appears weak on left side. Objective: Vitals:   03/05/20 1718 03/05/20 2054 03/06/20 0200 03/06/20 0553  BP: 128/75 140/82 115/73 110/64  Pulse: 81 71 76 79  Resp: 17 16 16 16   Temp: 98.8 F (37.1 C) 98.2 F (36.8 C) 98.1 F (36.7 C) 98.2 F (36.8 C)  TempSrc: Oral Oral Oral Oral  SpO2: 100% 100% 100% 100%  Weight:      Height:        Intake/Output Summary (Last 24 hours) at 03/06/2020 1227 Last data filed at 03/06/2020 0900 Gross per 24 hour  Intake 720 ml  Output 600 ml  Net 120 ml   Filed Weights   03/04/20 2032 03/05/20 0918  Weight: 102.3 kg 97.9 kg    Examination:  General exam: Appears calm and comfortable.  Respiratory system: Clear to auscultation. Respiratory effort normal. Cardiovascular system: S1 &  S2 heard, RRR. No JVD, murmurs, rubs, gallops or clicks. No pedal edema. Gastrointestinal system: Abdomen is nondistended, soft and nontender. No organomegaly or masses felt. Normal bowel sounds heard. Central nervous system: Alert and oriented. No focal neurological deficits.  Numbness noted on the left side. Extremities: No edema, no cyanosis no clubbing. Skin: No rashes, lesions or ulcers Psychiatry: Judgement and insight appear  normal. Mood & affect appropriate.     Data Reviewed: I have personally reviewed following labs and imaging studies  CBC: Recent Labs  Lab 03/04/20 2038 03/04/20 2042 03/05/20 0456  WBC 12.2*  --  9.3  NEUTROABS 8.3*  --   --   HGB 15.4* 16.3* 13.9  HCT 46.2* 48.0* 43.1  MCV 80.9  --  81.0  PLT 301  --  419   Basic Metabolic Panel: Recent Labs  Lab 03/04/20 2038 03/04/20 2042 03/05/20 0456  NA 136 142 138  K 3.5 3.7 3.5  CL 102 103 103  CO2 24  --  25  GLUCOSE 114* 108* 104*  BUN 9 7 12   CREATININE 0.85 0.80 0.72  CALCIUM 9.3  --  8.9  MG  --   --  2.0  PHOS  --   --  3.8   GFR: Estimated Creatinine Clearance: 106.9 mL/min (by C-G formula based on SCr of 0.72 mg/dL). Liver Function Tests: Recent Labs  Lab 03/04/20 2038 03/05/20 0456  AST 20 18  ALT 22 21  ALKPHOS 47 40  BILITOT 0.6 0.4  PROT 8.2* 6.9  ALBUMIN 3.9 3.4*   No results for input(s): LIPASE, AMYLASE in the last 168 hours. No results for input(s): AMMONIA in the last 168 hours. Coagulation Profile: Recent Labs  Lab 03/04/20 2038 03/05/20 0456  INR 1.0 1.1   Cardiac Enzymes: No results for input(s): CKTOTAL, CKMB, CKMBINDEX, TROPONINI in the last 168 hours. BNP (last 3 results) No results for input(s): PROBNP in the last 8760 hours. HbA1C: Recent Labs    03/05/20 0456  HGBA1C 5.6   CBG: Recent Labs  Lab 03/04/20 2033  GLUCAP 142*   Lipid Profile: Recent Labs    03/05/20 0456  CHOL 142  HDL 35*  LDLCALC 96  TRIG 57  CHOLHDL 4.1   Thyroid Function Tests: Recent Labs    03/05/20 0456  TSH 0.283*   Anemia Panel: No results for input(s): VITAMINB12, FOLATE, FERRITIN, TIBC, IRON, RETICCTPCT in the last 72 hours. Sepsis Labs: No results for input(s): PROCALCITON, LATICACIDVEN in the last 168 hours.  Recent Results (from the past 240 hour(s))  Respiratory Panel by RT PCR (Flu A&B, Covid) - Nasopharyngeal Swab     Status: None   Collection Time: 03/04/20 10:34 PM    Specimen: Nasopharyngeal Swab  Result Value Ref Range Status   SARS Coronavirus 2 by RT PCR NEGATIVE NEGATIVE Final    Comment: (NOTE) SARS-CoV-2 target nucleic acids are NOT DETECTED.  The SARS-CoV-2 RNA is generally detectable in upper respiratoy specimens during the acute phase of infection. The lowest concentration of SARS-CoV-2 viral copies this assay can detect is 131 copies/mL. A negative result does not preclude SARS-Cov-2 infection and should not be used as the sole basis for treatment or other patient management decisions. A negative result may occur with  improper specimen collection/handling, submission of specimen other than nasopharyngeal swab, presence of viral mutation(s) within the areas targeted by this assay, and inadequate number of viral copies (<131 copies/mL). A negative result must be combined with clinical observations, patient  history, and epidemiological information. The expected result is Negative.  Fact Sheet for Patients:  PinkCheek.be  Fact Sheet for Healthcare Providers:  GravelBags.it  This test is no t yet approved or cleared by the [place] FDA and  has been authorized for detection and/or diagnosis of SARS-CoV-2 by FDA under an Emergency Use Authorization (EUA). This EUA will remain  in effect (meaning this test can be used) for the duration of the COVID-19 declaration under Section 564(b)(1) of the Act, 21 U.S.C. section 360bbb-3(b)(1), unless the authorization is terminated or revoked sooner.     Influenza A by PCR NEGATIVE NEGATIVE Final   Influenza B by PCR NEGATIVE NEGATIVE Final    Comment: (NOTE) The Xpert Xpress SARS-CoV-2/FLU/RSV assay is intended as an aid in  the diagnosis of influenza from Nasopharyngeal swab specimens and  should not be used as a sole basis for treatment. Nasal washings and  aspirates are unacceptable for Xpert Xpress SARS-CoV-2/FLU/RSV   testing.  Fact Sheet for Patients: PinkCheek.be  Fact Sheet for Healthcare Providers: GravelBags.it  This test is not yet approved or cleared by the [place] FDA and  has been authorized for detection and/or diagnosis of SARS-CoV-2 by  FDA under an Emergency Use Authorization (EUA). This EUA will remain  in effect (meaning this test can be used) for the duration of the  Covid-19 declaration under Section 564(b)(1) of the Act, 21  U.S.C. section 360bbb-3(b)(1), unless the authorization is  terminated or revoked. Performed at [facility], [address] [place], [place] [address]      Radiology Studies: MR ANGIO HEAD WO CONTRAST  Result Date: 03/05/2020 CLINICAL DATA:  Left sided numbness EXAM: MRI HEAD WITHOUT CONTRAST MRA HEAD WITHOUT CONTRAST TECHNIQUE: Multiplanar, multiecho pulse sequences of the brain and surrounding structures were obtained without intravenous contrast. Angiographic images of the head were obtained using MRA technique without contrast. COMPARISON:  None. FINDINGS: MRI HEAD Brain: There is an 8 mm focus of restricted diffusion involving the lateral right thalamus. There is no acute infarction or intracranial hemorrhage. There is no intracranial mass, mass effect, or edema. There is no hydrocephalus or extra-axial fluid collection. Ventricles and sulci are normal in size and configuration. Patchy T2 hyperintensity in the supratentorial white matter is nonspecific may reflect minor chronic microvascular ischemic changes. Vascular: Major vessel flow voids at the skull base are preserved. Skull and upper cervical spine: Normal marrow signal is preserved. Sinuses/Orbits: Paranasal sinuses are aerated. Orbits are unremarkable. Other: Sella is unremarkable.  Mastoid air cells are clear. MRA HEAD Intracranial internal carotid arteries are patent. Middle and anterior cerebral arteries are patent. Intracranial vertebral  arteries, basilar artery, posterior cerebral arteries are patent. There is no significant stenosis or aneurysm. IMPRESSION: Acute subcentimeter infarct of the lateral right thalamus. No proximal intracranial vessel occlusion or significant stenosis. Electronically Signed   By: Macy Mis M.D.   On: 03/05/2020 12:40   MR BRAIN WO CONTRAST  Result Date: 03/05/2020 CLINICAL DATA:  Left sided numbness EXAM: MRI HEAD WITHOUT CONTRAST MRA HEAD WITHOUT CONTRAST TECHNIQUE: Multiplanar, multiecho pulse sequences of the brain and surrounding structures were obtained without intravenous contrast. Angiographic images of the head were obtained using MRA technique without contrast. COMPARISON:  None. FINDINGS: MRI HEAD Brain: There is an 8 mm focus of restricted diffusion involving the lateral right thalamus. There is no acute infarction or intracranial hemorrhage. There is no intracranial mass, mass effect, or edema. There is no hydrocephalus or extra-axial fluid collection. Ventricles and  sulci are normal in size and configuration. Patchy T2 hyperintensity in the supratentorial white matter is nonspecific may reflect minor chronic microvascular ischemic changes. Vascular: Major vessel flow voids at the skull base are preserved. Skull and upper cervical spine: Normal marrow signal is preserved. Sinuses/Orbits: Paranasal sinuses are aerated. Orbits are unremarkable. Other: Sella is unremarkable.  Mastoid air cells are clear. MRA HEAD Intracranial internal carotid arteries are patent. Middle and anterior cerebral arteries are patent. Intracranial vertebral arteries, basilar artery, posterior cerebral arteries are patent. There is no significant stenosis or aneurysm. IMPRESSION: Acute subcentimeter infarct of the lateral right thalamus. No proximal intracranial vessel occlusion or significant stenosis. Electronically Signed   By: Macy Mis M.D.   On: 03/05/2020 12:40   US Carotid Bilateral  Result Date:  03/05/2020 CLINICAL DATA:  44 year old female with a history of stroke EXAM: BILATERAL CAROTID DUPLEX ULTRASOUND TECHNIQUE: Pearline Cables scale imaging, color Doppler and duplex ultrasound were performed of bilateral carotid and vertebral arteries in the neck. COMPARISON:  None. FINDINGS: Criteria: Quantification of carotid stenosis is based on velocity parameters that correlate the residual internal carotid diameter with NASCET-based stenosis levels, using the diameter of the distal internal carotid lumen as the denominator for stenosis measurement. The following velocity measurements were obtained: RIGHT ICA:  Systolic 72 cm/sec, Diastolic 37 cm/sec CCA:  93 cm/sec SYSTOLIC ICA/CCA RATIO:  0.8 ECA:  78 cm/sec LEFT ICA:  Systolic 65 cm/sec, Diastolic 28 cm/sec CCA:  570 cm/sec SYSTOLIC ICA/CCA RATIO:  0.6 ECA:  75 cm/sec Right Brachial SBP: Not acquired Left Brachial SBP: Not acquired RIGHT CAROTID ARTERY: No significant calcified disease of the right common carotid artery. Intermediate waveform maintained. Homogeneous plaque without significant calcifications at the right carotid bifurcation. Low resistance waveform of the right ICA. No significant tortuosity. RIGHT VERTEBRAL ARTERY: Antegrade flow with low resistance waveform. LEFT CAROTID ARTERY: No significant calcified disease of the left common carotid artery. Intermediate waveform maintained. Homogeneous plaque at the left carotid bifurcation without significant calcifications. Low resistance waveform of the left ICA. LEFT VERTEBRAL ARTERY:  Antegrade flow with low resistance waveform. IMPRESSION: Color duplex indicates minimal homogeneous plaque, with no hemodynamically significant stenosis by duplex criteria in the extracranial cerebrovascular circulation. Signed, Dulcy Fanny. Dellia Nims, RPVI [facility] Radiology Electronically Signed   By: Corrie Mckusick D.O.   On: 03/05/2020 13:27   ECHOCARDIOGRAM COMPLETE  Result  Date: 03/05/2020    ECHOCARDIOGRAM REPORT   Patient Name:   KARY SUGRUE Date of Exam: 03/05/2020 Medical Rec #:  [id number]          Height:       66.0 in Accession #:    [id number]         Weight:       215.8 lb Date of Birth:  05-17-1976          BSA:          2.066 m Patient Age:    12 years           BP:           124/70 mmHg Patient Gender: F                  HR:           86 bpm. Exam Location:  [facility] Procedure: 2D Echo Indications:    Stroke 434.91 / I163.9  History:        Patient has no prior history of Echocardiogram  examinations.                 Stroke; Risk Factors:Hypertension. 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  Sonographer:    Leavy Cella RDCS (AE) Referring Phys: [id number] OLADAPO ADEFESO IMPRESSIONS  1. Left ventricular ejection fraction, by estimation, is 60 to 65%. The left ventricle has normal function. The left ventricle has no regional wall motion abnormalities. There is mild left ventricular hypertrophy. Left ventricular diastolic parameters are indeterminate.  2. Right ventricular systolic function is normal. The right ventricular size is normal.  3. The mitral valve is normal in structure. No evidence of mitral valve regurgitation. No evidence of mitral stenosis.  4. The aortic valve is tricuspid. Aortic valve regurgitation is not visualized. No aortic stenosis is present.  5. The inferior vena cava is normal in size with greater than 50% respiratory variability, suggesting right atrial pressure of 3 mmHg. FINDINGS  Left Ventricle: Left ventricular ejection fraction, by estimation, is 60 to 65%. The left ventricle has normal function. The left ventricle has no regional wall motion abnormalities. The left ventricular internal cavity size was normal in size. There is  mild left ventricular hypertrophy. Left ventricular diastolic parameters are indeterminate. Right Ventricle: The right ventricular size is normal. No increase in right ventricular wall thickness. Right  ventricular systolic function is normal. Left Atrium: Left atrial size was normal in size. Right Atrium: Right atrial size was normal in size. Pericardium: There is no evidence of pericardial effusion. Mitral Valve: The mitral valve is normal in structure. No evidence of mitral valve regurgitation. No evidence of mitral valve stenosis. Tricuspid Valve: The tricuspid valve is normal in structure. Tricuspid valve regurgitation is not demonstrated. No evidence of tricuspid stenosis. Aortic Valve: The aortic valve is tricuspid. Aortic valve regurgitation is not visualized. No aortic stenosis is present. Aortic valve mean gradient measures 6.1 mmHg. Aortic valve peak gradient measures 11.0 mmHg. Aortic valve area, by VTI measures 1.70  cm. Pulmonic Valve: The pulmonic valve was not well visualized. Pulmonic valve regurgitation is not visualized. No evidence of pulmonic stenosis. Aorta: The aortic root is normal in size and structure. Pulmonary Artery: Indeterminant PASP, inadequate TR jet. Venous: The inferior vena cava is normal in size with greater than 50% respiratory variability, suggesting right atrial pressure of 3 mmHg. IAS/Shunts: No atrial level shunt detected by color flow Doppler.  LEFT VENTRICLE PLAX 2D LVIDd:         3.81 cm  Diastology LVIDs:         2.69 cm  LV e' medial:    7.07 cm/s LV PW:         1.10 cm  LV E/e' medial:  9.8 LV IVS:        1.07 cm  LV e' lateral:   8.27 cm/s LVOT diam:     2.00 cm  LV E/e' lateral: 8.4 LV SV:         53 LV SV Index:   26 LVOT Area:     3.14 cm  RIGHT VENTRICLE RV S prime:     12.70 cm/s TAPSE (M-mode): 1.9 cm LEFT ATRIUM           Index       RIGHT ATRIUM          Index LA diam:      3.00 cm 1.45 cm/m  RA Area:  9.53 cm LA Vol (A2C): 24.1 ml 11.67 ml/m RA Volume:   20.00 ml 9.68 ml/m LA Vol (A4C): 34.0 ml 16.46 ml/m  AORTIC VALVE AV Area (Vmax):    2.12 cm AV Area (Vmean):   1.90 cm AV Area (VTI):     1.70 cm AV Vmax:           166.14 cm/s AV Vmean:           118.985 cm/s AV VTI:            0.314 m AV Peak Grad:      11.0 mmHg AV Mean Grad:      6.1 mmHg LVOT Vmax:         112.13 cm/s LVOT Vmean:        71.874 cm/s LVOT VTI:          0.170 m LVOT/AV VTI ratio: 0.54  AORTA Ao Root diam: 2.40 cm MITRAL VALVE MV Area (PHT): 4.52 cm    SHUNTS MV Decel Time: 168 msec    Systemic VTI:  0.17 m MV E velocity: 69.50 cm/s  Systemic Diam: 2.00 cm MV A velocity: 71.70 cm/s MV E/A ratio:  0.97 Carlyle Dolly MD Electronically signed by Carlyle Dolly MD Signature Date/Time: 03/05/2020/10:47:07 AM    Final    CT HEAD CODE STROKE WO CONTRAST  Result Date: 03/04/2020 CLINICAL DATA:  Code stroke. Left-sided numbness beginning 9 hours ago. EXAM: CT HEAD WITHOUT CONTRAST TECHNIQUE: Contiguous axial images were obtained from the base of the skull through the vertex without intravenous contrast. COMPARISON:  None. FINDINGS: Brain: Normal appearance without evidence malformation, atrophy, old or acute infarction, mass lesion, hemorrhage, hydrocephalus or extra-axial collection. Vascular: No abnormal vascular finding. Skull: Normal Sinuses/Orbits: Clear/normal Other: None ASPECTS ([place] Stroke Program Early CT Score) - Ganglionic level infarction (caudate, lentiform nuclei, internal capsule, insula, M1-M3 cortex): 7 - Supraganglionic infarction (M4-M6 cortex): 3 Total score (0-10 with 10 being normal): 10 IMPRESSION: 1. Normal head CT. 2. ASPECTS is 10. 3. These results were called by telephone at the time of interpretation on 03/04/2020 at 8:56 pm to provider Fredia Sorrow , who verbally acknowledged these results. Electronically Signed   By: Nelson Chimes M.D.   On: 03/04/2020 20:56    Scheduled Meds: . aspirin EC  81 mg Oral Daily  . lidocaine  1 patch Transdermal Q24H   Continuous Infusions:   LOS: 1 day    Time spent: 35 mins.    Shawna Clamp, MD [facility]   If 7PM-7AM, please contact night-coverage

## 2020-03-06 NOTE — TOC Progression Note (Signed)
Transition of Care [facility]) - Progression Note    Patient Details  Name: Joyce [name] MRN: [id number] Date of Birth: May 06, 1976  Transition of Care [facility] CM/SW Contact  Natasha Bence, LCSW Phone Number: 03/06/2020, 2:29 PM  Clinical Narrative:    CSW identified patient's insurance status. CSW consulted with patient to inquire if patient would be agreeable to [facility] referral. CSW placed referral with Tito Dine the Geneticist, molecular. TOC to follow.        Expected Discharge Plan and Services                                                 Social Determinants of Health (SDOH) Interventions    Readmission Risk Interventions No flowsheet data found.

## 2020-03-06 NOTE — PMR Pre-admission (Signed)
PMR Admission Coordinator Pre-Admission Assessment  Patient: Joyce [name] is an 44 y.o., female MRN: [id number] DOB: 07-04-1975 Height: 5\' 6"  (167.6 cm) Weight: 97.9 kg  Insurance Information HMO:     PPO:      PCP:      IPA:      80/20:      OTHER:  PRIMARY: uninsured      Policy#:       Subscriber:  CM Name:       Phone#:      Fax#:  Pre-Cert#:       Employer:  Benefits:  Phone #:      Name:  Eff. Date:      Deduct:       Out of Pocket Max:       Life Max:  CIR:       SNF:  Outpatient:      Co-Pay:  Home Health:       Co-Pay:  DME:      Co-Pay:  Providers:  SECONDARY:       Policy#:      Phone#:   Development worker, community:       Phone#:   The Engineer, petroleum" for patients in Inpatient Rehabilitation Facilities with attached "Privacy Act [place] Records" was provided and verbally reviewed with: N/A  Emergency Contact Information Contact Information    Name Relation Home Work Mobile   [place] Daughter [phone]  [phone]   [place] Significant other [phone]  [phone]      Current Medical History  Patient Admitting Diagnosis: CVA   History of Present Illness: Joyce [name] is a 44 y.o. female with medical history significant for hypertension, depression and migraine who presents to the emergency department due to left-sided numbness of upper and lower extremities which started around 12 noon.  Patient states that she took a nap with the hope that symptoms will go away, but on waking up, she had difficulty in walking and she states that she fell 3 times without hurting herself, so she drove herself to the ED, she denies chest pain, shortness of breath, headache, nausea, vomiting or abdominal pain.   In the ED she had CT head which was negative for stroke. Tele- neurologist was consulted,  admitted for a stroke work-up.  Carotid duplex: no hemodynamically significant stenosis.  Echocardiogram unremarkable . 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 No proximal intracranial vessel occlusion or significant stenosis. PT recommended CIR.  Complete NIHSS TOTAL: 5  Patient's medical record from [facility] has been reviewed by the rehabilitation admission coordinator and physician.  Past Medical History  Past Medical History:  Diagnosis Date  . Acute CVA (cerebrovascular accident) ([place]) 03/04/2020  . AMA (advanced maternal age) multigravida 35+ 11/12/2014  . Anemia   . Depression   . Hypertension   . Migraine   . Pregnant 11/12/2014  . PTSD (post-traumatic stress disorder) 05/01/2017  . Round ligament pain 11/12/2014  . Short of breath on exertion 11/12/2014  . Trichomonas infection     Family History   family history includes ADD / ADHD in her son; Alzheimer's disease in her father; Asthma in her mother, son, son, and son; Diabetes in her father, mother, and sister; Heart murmur in her son; Hyperlipidemia in her mother; Hypertension in her mother; Kidney disease in her mother and sister.  Prior Rehab/Hospitalizations Has the patient had prior rehab or hospitalizations prior to admission? No  Has the patient had major  surgery during 100 days prior to admission? No   Current Medications  Current Facility-Administered Medications:  .  aspirin EC tablet 325 mg, 325 mg, Oral, Daily, [place], Kofi, MD, 325 mg at 03/09/20 1040 .  atorvastatin (LIPITOR) tablet 80 mg, 80 mg, Oral, Daily, Shawna Clamp, MD, 80 mg at 03/09/20 1040 .  clopidogrel (PLAVIX) tablet 75 mg, 75 mg, Oral, Q breakfast, [place], Kofi, MD, 75 mg at 03/09/20 1041 .  lidocaine (LIDODERM) 5 % 1 patch, 1 patch, Transdermal, Q24H, Adefeso, Oladapo, DO, 1 patch at 03/05/20 0739 .  oxyCODONE (Oxy IR/ROXICODONE) immediate release tablet 5 mg, 5 mg, Oral, Q4H PRN, Adefeso, Oladapo, DO, 5 mg at 03/09/20 2238 .  topiramate (TOPAMAX) tablet 25 mg, 25 mg, Oral, Daily, [place], Kofi, MD, 25 mg at 03/09/20 1041 .  traZODone  (DESYREL) tablet 50 mg, 50 mg, Oral, QHS PRN, Shawna Clamp, MD, 50 mg at 03/08/20 2210  Patients Current Diet:  Diet Order            Diet Heart Room service appropriate? Yes; Fluid consistency: Thin  Diet effective now                 Precautions / Restrictions Precautions Precautions: Fall Precaution Comments: left side hemiparesis Restrictions Weight Bearing Restrictions: No   Has the patient had 2 or more falls or a fall with injury in the past year? 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 Independent  Indoor Mobility: Did the patient need assistance with walking from room to room (with or without device)? Independent  Stairs: Did the patient need assistance with internal or external stairs (with or without device)? Independent  Functional Cognition: Did the patient need help planning regular tasks such as shopping or remembering to take medications? Independent  Home Assistive Devices / Equipment Home Assistive Devices/Equipment: None Home Equipment: None  Prior Device Use: Indicate devices/aids used by the patient prior to current illness, exacerbation or injury? None of the above  Current Functional Level Cognition  Overall Cognitive Status: Within Functional Limits for tasks assessed Orientation Level: Oriented X4    Extremity Assessment (includes Sensation/Coordination)  Upper Extremity Assessment: LUE deficits/detail LUE Deficits / Details: Patient able to complete 50% of shoulder ranges initially although with increased use she experiences muscle fatigue and is only able to complete 25% or less. Full A/ROM elbow flexion/extension, wrist flexion/extension, and composite fingers flexion/extension. Slow and labored motor movements for all LUE AROM. Strength is as follows: shoulder flexion  and abduction: 3-/5, IR/er 3+/5, elbow flexion: 4-/5, elbow extension: 3/5, forearm supination/pronation: 3+/5, wrist flexion/extension: 3+/5, decreased gross grasp. LUE Sensation: decreased light touch LUE Coordination: decreased gross motor, decreased fine motor  Lower Extremity Assessment: Defer to PT evaluation RLE Deficits / Details: grossly 5/5 RLE Sensation: WNL RLE Coordination: WNL LLE Deficits / Details: grossly 3/5 LLE Sensation: decreased light touch, decreased proprioception LLE Coordination: decreased fine motor    ADLs  Overall ADL's : Needs assistance/impaired Eating/Feeding: Set up, Sitting Grooming: Wash/dry hands, Wash/dry face, Oral care, Applying deodorant, Minimal assistance, Sitting Upper Body Bathing: Minimal assistance, Sitting Lower Body Bathing: Moderate assistance, Sitting/lateral leans Upper Body Dressing : Moderate assistance, Sitting Lower Body Dressing: Moderate assistance, Sitting/lateral leans Toilet Transfer Details (indicate cue type and reason): Not tested this date General ADL Comments: Slow labored movements with all  activities. Requires increased assistance with LUE due to muscle weakness.    Mobility  Overal bed mobility: Needs Assistance Bed Mobility: Supine to Sit, Sit to Supine Supine to sit: Supervision, HOB elevated Sit to supine: Min assist General bed mobility comments: assist for LLE back into bed    Transfers  Overall transfer level: Needs assistance Equipment used: 1 person hand held assist, Rolling walker (2 wheeled) Transfers: Sit to/from Stand, Stand Pivot Transfers Sit to Stand: Min assist, Mod assist Stand pivot transfers: Min assist, Mod assist General transfer comment: Patient uses bed and PT assist to tranfer to standing and reaches for RW following standing, patient unsteady upon initial standing which improves with use of RW. Heavy UE use with standing, limited weightbearing on LLE due to apprehension    Ambulation /  Gait / Stairs / Wheelchair Mobility  Ambulation/Gait Ambulation/Gait assistance: Min assist, Mod assist Gait Distance (Feet): 60 Feet Assistive device: Rolling walker (2 wheeled) Gait Pattern/deviations: Decreased step length - right, Decreased step length - left, Decreased stride length, Decreased dorsiflexion - left, Step-to pattern General Gait Details: very unsteady with poor tolerance for weightbearing on LLE due to weakness and impaired sensation, required use of RW demonstrating slow labored cadence. Patient with c/o hand symptoms increasing with gripping on RW and with fatigue requiring standing rest breaks Gait velocity: decreased    Posture / Balance Dynamic Sitting Balance Sitting balance - Comments: good seated EOB Balance Overall balance assessment: Needs assistance Sitting-balance support: Feet supported, No upper extremity supported Sitting balance-Leahy Scale: Good Sitting balance - Comments: good seated EOB Postural control: Posterior lean Standing balance support: During functional activity Standing balance-Leahy Scale: Good Standing balance comment: good/fair with using RW    Special needs/care consideration n/a   Previous Home Environment (from acute therapy documentation) Living Arrangements: Spouse/significant other, Children Available Help at Discharge: Family, Available 24 hours/day Type of Home: House Home Layout: Two level, Bed/bath upstairs Alternate Level Stairs-Rails: Left Alternate Level Stairs-Number of Steps: 10-15 (1 flight) Home Access: Stairs to enter Entrance Stairs-Rails: None Entrance Stairs-Number of Steps: 4 Bathroom Accessibility: Yes Home Care Services: No  Discharge Living Setting Plans for Discharge Living Setting: Patient's home Type of Home at Discharge: House Discharge Home Layout: Bed/bath upstairs, Two level Alternate Level Stairs-Rails: Left Alternate Level Stairs-Number of Steps: full flight Discharge Home Access: Stairs to  enter Entrance Stairs-Rails: None Entrance Stairs-Number of Steps: 3-4 Discharge Bathroom Shower/Tub: Tub/shower unit Discharge Bathroom Toilet:  (uses a BSC??) Discharge Bathroom Accessibility: Yes How Accessible: Accessible via walker Does the patient have any problems obtaining your medications?: No  Social/Family/Support Systems Patient Roles: Parent Anticipated Caregiver: Kirby Crigler (daughter)  Anticipated Caregiver's Contact Information: [phone] Ability/Limitations of Caregiver: works days Caregiver Availability: Evenings only Discharge Plan Discussed with Primary Caregiver: No (discussed with patient) Is Caregiver In Agreement with Plan?: Yes Does Caregiver/Family have Issues with Lodging/Transportation while Pt is in Rehab?: No  Goals Patient/Family Goal for Rehab: PT/OT mod I, SLP n/a Expected length of stay: 7-10 days Pt/Family Agrees to Admission and willing to participate: Yes Program Orientation Provided & Reviewed with Pt/Caregiver Including Roles  & Responsibilities: Yes  Decrease burden of Care through IP rehab admission: n/a  Possible need for SNF placement upon discharge: Not anticipated  Patient Condition: I have reviewed medical records from [facility], spoken with CM, and patient. I discussed via phone for inpatient rehabilitation assessment.  Patient will benefit from ongoing PT and OT, can actively participate in 3 hours of therapy a  day 5 days of the week, and can make measurable gains during the admission.  Patient will also benefit from the coordinated team approach during an Inpatient Acute Rehabilitation admission.  The patient will receive intensive therapy as well as Rehabilitation physician, nursing, social worker, and care management interventions.  Due to safety, disease management, medication administration, pain management and patient education the patient requires 24 hour a day rehabilitation nursing.  The patient is currently min-mod A with  mobility and basic ADLs.  Discharge setting and therapy post discharge at home with home health is anticipated.  Patient has agreed to participate in the Acute Inpatient Rehabilitation Program and will admit today.  Preadmission Screen Completed By: Shann Medal, PT, DPT with updates by   Cleatrice Burke, 03/10/2020 10:03 AM ______________________________________________________________________   Discussed status with Dr. Dagoberto Ligas on  03/10/2020 at 36 and received approval for admission today.  Admission Coordinator: Shann Medal, PT, DPT;   Julious Payer Audelia Acton, RN, time 1005 Date 03/10/2020   Assessment/Plan: Diagnosis: 1. Does the need for close, 24 hr/day Medical supervision in concert with the patient's rehab needs make it unreasonable for this patient to be served in a less intensive setting? Yes 2. Co-Morbidities requiring supervision/potential complications: HTN, depression, migraines, L hemiparesis, R lateral thalamus infarct 3. Due to bladder management, bowel management, safety, skin/wound care, disease management, medication administration and patient education, does the patient require 24 hr/day rehab nursing? Yes 4. Does the patient require coordinated care of a physician, rehab nurse, PT, OT, and SLP to address physical and functional deficits in the context of the above medical diagnosis(es)? Yes Addressing deficits in the following areas: balance, endurance, locomotion, strength, transferring, bathing, dressing, feeding, grooming, toileting, cognition, speech and swallowing 5. Can the patient actively participate in an intensive therapy program of at least 3 hrs of therapy 5 days a week? Yes 6. The potential for patient to make measurable gains while on inpatient rehab is good 7. Anticipated functional outcomes upon discharge from inpatient rehab: modified independent PT, modified independent OT, modified independent SLP 8. Estimated rehab length of stay to reach the  above functional goals is: 7-10 days 9. Anticipated discharge destination: Home 10. Overall Rehab/Functional Prognosis: good   MD Signature:

## 2020-03-06 NOTE — Plan of Care (Signed)
  Problem: Acute Rehab OT Goals (only OT should resolve) Goal: Pt. Will Perform Eating Flowsheets (Taken 03/06/2020 1149) Pt Will Perform Eating:  with modified independence  sitting Goal: Pt. Will Perform Grooming Flowsheets (Taken 03/06/2020 1149) Pt Will Perform Grooming:  with modified independence  sitting Goal: Pt. Will Perform Upper Body Bathing Flowsheets (Taken 03/06/2020 1149) Pt Will Perform Upper Body Bathing:  with supervision  sitting Goal: Pt. Will Perform Lower Body Bathing Flowsheets (Taken 03/06/2020 1149) Pt Will Perform Lower Body Bathing:  with supervision  sitting/lateral leans  sit to/from stand Goal: Pt. Will Perform Upper Body Dressing Flowsheets (Taken 03/06/2020 1149) Pt Will Perform Upper Body Dressing:  with supervision  sitting Goal: Pt. Will Perform Lower Body Dressing Flowsheets (Taken 03/06/2020 1149) Pt Will Perform Lower Body Dressing:  with min assist  sit to/from stand  sitting/lateral leans Goal: Pt. Will Transfer To Toilet Flowsheets (Taken 03/06/2020 1149) Pt Will Transfer to Toilet:  with supervision  ambulating Goal: Pt. Will Perform Toileting-Clothing Manipulation Flowsheets (Taken 03/06/2020 1149) Pt Will Perform Toileting - Clothing Manipulation and hygiene:  with supervision  sit to/from stand  sitting/lateral leans Goal: Pt/Caregiver Will Perform Home Exercise Program Flowsheets (Taken 03/06/2020 1149) Pt/caregiver will Perform Home Exercise Program:  Increased ROM  Increased strength  Left upper extremity  With Supervision  With written HEP provided

## 2020-03-06 NOTE — Progress Notes (Signed)
Physical Therapy Treatment Patient Details Name: Joyce [name] MRN: [id number] DOB: 1975/09/06 Today's Date: 03/06/2020    History of Present Illness Joyce [name] is a 44 y.o. female with medical history significant for hypertension, depression and migraine who presents to the emergency department due to left-sided numbness of upper and lower extremities which started around 12 noon.  Patient states that she took a nap with the hope that symptoms will go away, but on waking up, she had difficulty in walking and she states that she fell 3 times without hurting herself, so she decided to go to the ED for further evaluation and management, patient drove herself to the ED, she denies chest pain, shortness of breath, headache, nausea, vomiting or abdominal pain.      PT Comments    Patient requiring supervision today to transition to seated EOB but not physical assist. Patient demonstrates good sitting tolerance and sitting balance today without loss of balance or truncal lean. Patient completes seated exercises with c/o LLE pain in hip with marches, in quads with LAQ, and in ankle with heel/toe raises which she attributes to stiffness for not using LLE much today. Patient requires min/mod assist to transfer to standing with bed and PT assist and is unsteady upon initial standing which improves with RW use. Patient stands with limited weight bearing on LLE and heavy use of RW secondary to apprehension due to impaired strength/sensation of LLE. Patient ambulates with slow, labored cadence with use of RW and is limited by fatigue and c/o increase in L hand symptoms. Patient returned to bed at end of session. Patient will benefit from continued physical therapy in hospital and recommended venue below to increase strength, balance, endurance for safe ADLs and gait.   Follow Up Recommendations  CIR     Equipment Recommendations  Rolling walker with 5" wheels    Recommendations for Other  Services       Precautions / Restrictions Precautions Precautions: Fall Precaution Comments: left side hemiparesis Restrictions Weight Bearing Restrictions: No    Mobility  Bed Mobility Overal bed mobility: Needs Assistance Bed Mobility: Supine to Sit;Sit to Supine     Supine to sit: Supervision;HOB elevated Sit to supine: Min assist   General bed mobility comments: assist for LLE back into bed  Transfers Overall transfer level: Needs assistance Equipment used: 1 person hand held assist;Rolling walker (2 wheeled) Transfers: Sit to/from [facility] Sit to Stand: Min assist;Mod assist Stand pivot transfers: Min assist;Mod assist       General transfer comment: Patient uses bed and PT assist to tranfer to standing and reaches for RW following standing, patient unsteady upon initial standing which improves with use of RW. Heavy UE use with standing, limited weightbearing on LLE due to apprehension  Ambulation/Gait Ambulation/Gait assistance: Min assist;Mod assist Gait Distance (Feet): 60 Feet Assistive device: Rolling walker (2 wheeled) Gait Pattern/deviations: Decreased step length - right;Decreased step length - left;Decreased stride length;Decreased dorsiflexion - left;Step-to pattern Gait velocity: decreased   General Gait Details: very unsteady with poor tolerance for weightbearing on LLE due to weakness and impaired sensation, required use of RW demonstrating slow labored cadence. Patient with c/o hand symptoms increasing with gripping on RW and with fatigue requiring standing rest breaks   Stairs             Wheelchair Mobility    Modified Rankin (Stroke Patients Only)       Balance Overall balance assessment: Needs assistance Sitting-balance support: Feet supported;No  upper extremity supported Sitting balance-Leahy Scale: Good Sitting balance - Comments: good seated EOB   Standing balance support: During functional activity Standing  balance-Leahy Scale: Good Standing balance comment: good/fair with using RW                            Cognition Arousal/Alertness: Awake/alert Behavior During Therapy: WFL for tasks assessed/performed Overall Cognitive Status: Within Functional Limits for tasks assessed                                        Exercises General Exercises - Lower Extremity Long Arc Quad: AROM;Both;10 reps;Seated Hip Flexion/Marching: AROM;Both;10 reps;Seated Toe Raises: AROM;Both;10 reps;Seated Heel Raises: AROM;Both;10 reps;Seated    General Comments        Pertinent Vitals/Pain Pain Assessment: 0-10 Pain Score: 10-Worst pain ever Pain Location: L side of body with L UE/hand the worst Pain Descriptors / Indicators: Pins and needles;Tingling Pain Intervention(s): Monitored during session    Home Living Family/patient expects to be discharged to:: Inpatient rehab Living Arrangements: Spouse/significant other;Children Available Help at Discharge: Family;Available 24 hours/day Type of Home: House Home Access: Stairs to enter Entrance Stairs-Rails: None Home Layout: Two level;Bed/bath upstairs Home Equipment: None      Prior Function Level of Independence: Independent      Comments: community ambulator, drives. 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   PT Goals (current goals can now be found in the care plan section) Acute Rehab PT Goals Patient Stated Goal: return home after rehab PT Goal Formulation: With patient/family Time For Goal Achievement: 03/19/20 Potential to Achieve Goals: Good Progress towards PT goals: Progressing toward goals    Frequency    Min 5X/week      PT Plan Current plan remains appropriate    Co-evaluation              AM-PAC PT "6 Clicks" Mobility   Outcome Measure  Help needed turning from your back to your side while in a flat bed without using bedrails?: None Help needed moving from lying on your back to sitting on  the side of a flat bed without using bedrails?: A Little Help needed moving to and from a bed to a chair (including a wheelchair)?: A Lot Help needed standing up from a chair using your arms (e.g., wheelchair or bedside chair)?: A Little Help needed to walk in hospital room?: A Lot Help needed climbing 3-5 steps with a railing? : Total 6 Click Score: 15    End of Session Equipment Utilized During Treatment: Gait belt Activity Tolerance: Patient tolerated treatment well;Patient limited by fatigue Patient left: with call bell/phone within reach;in bed;with bed alarm set Nurse Communication: Mobility status PT Visit Diagnosis: Unsteadiness on feet (R26.81);Other abnormalities of gait and mobility (R26.89);Muscle weakness (generalized) (M62.81);Hemiplegia and hemiparesis Hemiplegia - Right/Left: Left Hemiplegia - dominant/non-dominant: Non-dominant Hemiplegia - caused by: Cerebral infarction     Time: 0240-9735 PT Time Calculation (min) (ACUTE ONLY): 20 min  Charges:  $Therapeutic Activity: 8-22 mins                     2:56 PM, 03/06/20 Mearl Latin PT, DPT Physical Therapist at [facility]

## 2020-03-06 NOTE — Progress Notes (Signed)
Inpatient Rehab Admissions:  Inpatient Rehab Consult received.  I spoke with patient over the phone for rehabilitation assessment and to discuss goals and expectations of an inpatient rehab admission.  She is open to CIR to increase her independence prior to returning home.  She lives with her children, but states they work during the day so she would need to reach mod I level.  Based on my conversation with her, I feel she could at least reach intermittent supervision level.  We will not have a bed for her over the weekend, so will f/u with her on Monday.   Signed: Shann Medal, PT, DPT Admissions Coordinator [phone] 03/06/20  1:33 PM

## 2020-03-06 NOTE — Evaluation (Signed)
Occupational Therapy Evaluation Patient Details Name: Joyce [name] MRN: [id number] DOB: [birth year] Today's Date: 03/06/2020    History of Present Illness Joyce [name] is a 44 y.o. female with medical history significant for hypertension, depression and migraine who presents to the emergency department due to left-sided numbness of upper and lower extremities which started around 12 noon.  Patient states that she took a nap with the hope that symptoms will go away, but on waking up, she had difficulty in walking and she states that she fell 3 times without hurting herself, so she decided to go to the ED for further evaluation and management, patient drove herself to the ED, she denies chest pain, shortness of breath, headache, nausea, vomiting or abdominal pain.   Clinical Impression   Pt in bed upon therapy arrival and agreeable to participate in OT evaluation. Patient presents with decreased left side sensation with 10/10 pain level reported due to pins/needles/tingling sensation primarily in her left hand and left cheek. Left hemiparesis with decreased activity tolerance due to Left side weakness noted causing increased difficulty completing simple ADL tasks and requires increased physical assistance from others. Patient would benefit from continued therapy services at [facility] as she was independent and working prior to CVA. OT will follow patient acutely until discharge.     Follow Up Recommendations  CIR    Equipment Recommendations  None recommended by OT       Precautions / Restrictions Precautions Precautions: Fall Precaution Comments: left side hemiparesis Restrictions Weight Bearing Restrictions: No      Mobility Bed Mobility Overal bed mobility: Needs Assistance Bed Mobility: Supine to Sit;Sit to Supine     Supine to sit: Supervision;HOB elevated (using bed rail) Sit to supine: Min assist      Transfers Overall transfer level:  (See PT evaluation)        Balance Overall balance assessment: Needs assistance Sitting-balance support: Bilateral upper extremity supported;Feet supported Sitting balance-Leahy Scale: Fair      ADL either performed or assessed with clinical judgement   ADL Overall ADL's : Needs assistance/impaired Eating/Feeding: Set up;Sitting   Grooming: Wash/dry hands;Wash/dry face;Oral care;Applying deodorant;Minimal assistance;Sitting   Upper Body Bathing: Minimal assistance;Sitting   Lower Body Bathing: Moderate assistance;Sitting/lateral leans   Upper Body Dressing : Moderate assistance;Sitting   Lower Body Dressing: Moderate assistance;Sitting/lateral leans     Toilet Transfer Details (indicate cue type and reason): Not tested this date           General ADL Comments: Slow labored movements with all activities. Requires increased assistance with LUE due to muscle weakness.     Vision Baseline Vision/History: No visual deficits Patient Visual Report: No change from baseline Vision Assessment?: No apparent visual deficits            Pertinent Vitals/Pain Pain Assessment: 0-10 Pain Score: 10-Worst pain ever Pain Location: left hand and mouth. Reports 8/10 pain level with left side ribs Pain Descriptors / Indicators: Pins and needles;Tingling Pain Intervention(s): Monitored during session     Hand Dominance Right   Extremity/Trunk Assessment Upper Extremity Assessment Upper Extremity Assessment: LUE deficits/detail LUE Deficits / Details: Patient able to complete 50% of shoulder ranges initially although with increased use she experiences muscle fatigue and is only able to complete 25% or less. Full A/ROM elbow flexion/extension, wrist flexion/extension, and composite fingers flexion/extension. Slow and labored motor movements for all LUE AROM. Strength is as follows: shoulder flexion and abduction: 3-/5, IR/er 3+/5, elbow flexion: 4-/5, elbow extension: 3/5,  forearm supination/pronation: 3+/5, wrist  flexion/extension: 3+/5, decreased gross grasp. LUE Sensation: decreased light touch LUE Coordination: decreased gross motor;decreased fine motor   Lower Extremity Assessment Lower Extremity Assessment: Defer to PT evaluation       Communication Communication Communication: No difficulties   Cognition Arousal/Alertness: Awake/alert Behavior During Therapy: WFL for tasks assessed/performed Overall Cognitive Status: Within Functional Limits for tasks assessed                    Home Living Family/patient expects to be discharged to:: Inpatient rehab Living Arrangements: Spouse/significant other;Children Available Help at Discharge: Family;Available 24 hours/day Type of Home: House Home Access: Stairs to enter [employer] of Steps: 4 Entrance Stairs-Rails: None Home Layout: Two level;Bed/bath upstairs Alternate Level Stairs-Number of Steps: 10-15 (1 flight) Alternate Level Stairs-Rails: Left       Bathroom Accessibility: Yes   Home Equipment: None          Prior Functioning/Environment Level of Independence: Independent        Comments: community ambulator, drives. 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        OT Problem List: Decreased strength;Decreased coordination;Pain;Impaired sensation;Decreased range of motion;Decreased activity tolerance;Decreased knowledge of use of DME or AE;Impaired balance (sitting and/or standing);Impaired UE functional use      OT Treatment/Interventions: Self-care/ADL training;Therapeutic exercise;Therapeutic activities;Neuromuscular education;DME and/or AE instruction;Patient/family education;Balance training;Modalities;Manual therapy    OT Goals(Current goals can be found in the care plan section) Acute Rehab OT Goals Patient Stated Goal: return home after rehab OT Goal Formulation: With patient Time For Goal Achievement: 03/20/20 Potential to Achieve Goals: Good  OT Frequency: Min 3X/week    AM-PAC OT "6 Clicks"  Daily Activity     Outcome Measure Help from another person eating meals?: A Little Help from another person taking care of personal grooming?: A Little Help from another person toileting, which includes using toliet, bedpan, or urinal?: A Lot Help from another person bathing (including washing, rinsing, drying)?: A Lot Help from another person to put on and taking off regular upper body clothing?: A Lot Help from another person to put on and taking off regular lower body clothing?: A Lot 6 Click Score: 14   End of Session    Activity Tolerance: Patient tolerated treatment well Patient left: in bed;with call bell/phone within reach;with bed alarm set  OT Visit Diagnosis: Muscle weakness (generalized) (M62.81)                Time: 9604-5409 OT Time Calculation (min): 25 min Charges:  OT General Charges $OT Visit: 1 Visit OT Evaluation $OT Eval Low Complexity: 1 Low OT Treatments $Self Care/Home Management : 8-22 mins ([place], OTR/L,CBIS  [phone]   Lakisa Lotz, Clarene Duke 03/06/2020, 11:45 AM

## 2020-03-06 NOTE — Consult Note (Signed)
[place] A. Joyce Laughter, MD     [email or web address]          CHANNEL PAPANDREA is an 44 y.o. female.   Joyce [name]: 1.  Acute right thalamic capsular infarct: Risk factors hypertension, excessive use of NSAIDs and hormonal birth [name].  Dual antiplatelet agents are recommended Joyce 1 month.  Afterwards, aspirin 325 is recommended.  Blood pressure [name] is also recommended.  Joyce [name] and use another agent.  Joyce [name] but can be restarted at a later date.  Joyce [name].  Joyce [name].  Joyce [name]. 2.  Chronic migraine headaches: Joyce [name]. 3.  Chronic hypertension 4.  Posttraumatic stress disorder/anxiety disorder 5.  Post thalamic pain syndrome already which increases Joyce risk that Joyce will will have chronic central pain post thalamic pain syndrome.  Joyce [name], Joyce [name].     Joyce Joyce is a 44 year old black female who presents with acute onset of left leg numbness Joyce several hours.  Joyce.  Joyce was recommended Joyce thrombolysis because of Joyce presentation to Joyce hospital and mild symptoms.  Joyce Joyce apparently had some weakness which prompted her to seek medical attention.  Joyce fell about 3 times trying to ambulate.  Joyce reported that Joyce may have had a mild dysarthria.  In Joyce emergency room Joyce had a panic attack which Joyce has had in Joyce past and [name] was associated with palpitation and shortness of breath.  Joyce [name] daily Joyce a while.  Joyce has had headaches Joyce a long time of Joyce headaches seem to gotten a lot more intense and Joyce severe since Joyce has been placed on hormonal birth [name].  Joyce does not report smoking.  Joyce reports her blood pressure has  been elevated significantly recently.  Joyce Joyce reports that Joyce is already having tingling and pain on Joyce left side.  Joyce particularly of indicates having dysesthesia involving Joyce left anterior chest and trunk region.  Joyce has pain in Joyce left shoulder and left groin region.  Joyce also reports numbness involving Joyce oral cavity on Joyce left side.  Joyce review of systems otherwise negative.    GENERAL: [name] is a pleasant overweight female who is in some discomfort from pain on Joyce left side but no acute distress.  HEENT: No trauma noted and neck is supple.  ABDOMEN: soft  EXTREMITIES: No edema   BACK:  Normal  SKIN: Normal by inspection.    MENTAL STATUS: Alert and oriented -including orientation to Joyce month and her age. Speech, language and cognition are generally intact. Judgment and insight normal.   CRANIAL NERVES: Pupils are equal, round and reactive to light and accomodation; extra ocular movements are full, there is no significant nystagmus; visual fields are full; upper and lower facial muscles are normal in strength and symmetric, there is no flattening of Joyce nasolabial folds; tongue is midline; uvula is midline; shoulder elevation is normal.  MOTOR: There is mild drift of Joyce left upper extremity and left leg and both are associated with 4/5 weakness.  No drift on Joyce right side.  Strength is 5/5.  COORDINATION: Left finger to nose is normal, right finger to nose is normal, No rest tremor; no intention tremor;  no postural tremor; no bradykinesia.  REFLEXES: Deep tendon reflexes are symmetrical and normal. Plantar reflexes are flexor bilaterally.   SENSATION: There is significant impairment to Joyce temperature light touch involving Joyce left upper extremity, left trunk and left leg region.  [name] is also associated with hyperalgesia.      Blood pressure (!) 144/92, pulse 89, temperature 98.2 F (36.8 C), temperature source Oral, resp. rate 18, height 5\' 6"  (1.676 m), weight  97.9 kg, SpO2 100 %.  Past Medical History:  Diagnosis Date  . Acute CVA (cerebrovascular accident) ([place]) 03/04/2020  . AMA (advanced maternal age) multigravida 35+ 11/12/2014  . Anemia   . Depression   . Hypertension   . Migraine   . Pregnant 11/12/2014  . PTSD (post-traumatic stress disorder) 05/01/2017  . Round ligament pain 11/12/2014  . Short of breath on exertion 11/12/2014  . Trichomonas infection     Past Surgical History:  Procedure Laterality Date  . CHOLECYSTECTOMY      Family History  Problem Relation Age of Onset  . Diabetes Mother   . Hypertension Mother   . Kidney disease Mother   . Asthma Mother   . Hyperlipidemia Mother   . Alzheimer's disease Father   . Diabetes Father   . Kidney disease Sister   . Asthma Son   . Diabetes Sister   . Asthma Son   . ADD / ADHD Son   . Heart murmur Son   . Asthma Son     Social History:  reports that Joyce has never smoked. Joyce has never used smokeless tobacco. Joyce reports current alcohol use. Joyce reports that Joyce does not use drugs.  Allergies: No Known Allergies  Medications: Prior to Admission medications   Medication Sig Start Date End Date Taking? Authorizing Provider  amLODipine (NORVASC) 5 MG tablet TAKE 1 TABLET(5 MG) BY MOUTH DAILY Joyce taking differently: Take 5 mg by mouth daily.  07/25/17  Yes Cresenzo-Dishmon, Joaquim Lai, CNM  dexamethasone (DECADRON) 4 MG tablet Take 1 tablet (4 mg total) by mouth 2 (two) times daily with a meal. Joyce taking differently: Take 4 mg by mouth daily.  11/03/17  Yes Lily Kocher, PA-C  escitalopram (LEXAPRO) 20 MG tablet Take 1 tablet (20 mg total) by mouth daily. 10/11/17  Yes Norman Clay, MD  hydrOXYzine (ATARAX/VISTARIL) 25 MG tablet Take 1 tablet (25 mg total) by mouth every 6 (six) hours as needed Joyce anxiety. 08/17/18  Yes Milton Ferguson, MD  ibuprofen (ADVIL,MOTRIN) 200 MG tablet Take 200 mg by mouth every 6 (six) hours as needed Joyce mild pain or moderate pain.   Yes  [provider]  lisinopril-hydrochlorothiazide (PRINZIDE,ZESTORETIC) 20-25 MG tablet take one tablet by mouth once daily 04/04/17  Yes [provider]  medroxyPROGESTERone (DEPO-PROVERA) 150 MG/ML injection Inject 1 mL (150 mg total) into Joyce muscle every 3 (three) months. 01/15/19  Yes Florian Buff, MD  promethazine (PHENERGAN) 25 MG tablet Take 1 tablet (25 mg total) by mouth every 6 (six) hours as needed Joyce nausea or vomiting. 07/15/18  Yes Mesner, Corene Cornea, MD  SUMAtriptan (IMITREX) 50 MG tablet Take 1 tablet (50 mg total) by mouth once. May repeat in 2 hours if headache persists or recurs. 02/15/17 03/05/20 Yes Raylene Everts, MD  traZODone (DESYREL) 50 MG tablet Take 1 tablet (50 mg total) by mouth at bedtime as needed Joyce sleep. Joyce taking differently: Take 50 mg by mouth at bedtime.  10/11/17  Yes Norman Clay, MD  Prenatal Vit-Fe  Fumarate-FA (PREPLUS) 27-1 MG TABS TAKE 1 TABLET BY MOUTH DAILY AT 12 NOON Joyce not taking: Reported on 03/05/2020 04/04/17   Estill Dooms, NP    Scheduled Meds: . aspirin EC  81 mg Oral Daily  . atorvastatin  80 mg Oral Daily  . lidocaine  1 patch Transdermal Q24H   Continuous Infusions: PRN Meds:.oxyCODONE     Results Joyce orders placed or performed during Joyce hospital encounter of 03/04/20 (from Joyce past 48 hour(s))  CBG monitoring, ED     Status: Abnormal   Collection Time: 03/04/20  8:33 PM  Result Value Ref Range   Glucose-Capillary 142 (H) 70 - 99 mg/dL    Comment: Glucose reference range applies only to samples taken after fasting Joyce at least 8 hours.  Ethanol     Status: None   Collection Time: 03/04/20  8:38 PM  Result Value Ref Range   Alcohol, Ethyl (B) <10 <10 mg/dL    Comment: (NOTE) Lowest detectable limit Joyce serum alcohol is 10 mg/dL.  Joyce medical purposes only. Performed at [facility], [address], [place], [place] [address]   Protime-INR     Status: None   Collection Time: 03/04/20  8:38 PM   Result Value Ref Range   Prothrombin Time 12.9 11.4 - 15.2 seconds   INR 1.0 0.8 - 1.2    Comment: (NOTE) INR goal varies based on device and disease states. Performed at [facility], [address], [place], [place] [address]   APTT     Status: None   Collection Time: 03/04/20  8:38 PM  Result Value Ref Range   aPTT 30 24 - 36 seconds    Comment: Performed at [facility], [address], [place], [place] [address]  CBC     Status: Abnormal   Collection Time: 03/04/20  8:38 PM  Result Value Ref Range   WBC 12.2 (H) 4.0 - 10.5 K/uL   RBC 5.71 (H) 3.87 - 5.11 MIL/uL   Hemoglobin 15.4 (H) 12.0 - 15.0 g/dL   HCT 46.2 (H) 36 - 46 %   MCV 80.9 80.0 - 100.0 fL   MCH 27.0 26.0 - 34.0 pg   MCHC 33.3 30.0 - 36.0 g/dL   RDW 14.5 11.5 - 15.5 %   Platelets 301 150 - 400 K/uL   nRBC 0.0 0.0 - 0.2 %    Comment: Performed at [facility], [address], [place], [place] [address]  Differential     Status: Abnormal   Collection Time: 03/04/20  8:38 PM  Result Value Ref Range   Neutrophils Relative % 68 %   Neutro Abs 8.3 (H) 1.7 - 7.7 K/uL   Lymphocytes Relative 26 %   Lymphs Abs 3.1 0.7 - 4.0 K/uL   Monocytes Relative 5 %   Monocytes Absolute 0.6 0.1 - 1.0 K/uL   Eosinophils Relative 1 %   Eosinophils Absolute 0.1 0 - 0 K/uL   Basophils Relative 0 %   Basophils Absolute 0.1 0 - 0 K/uL   Immature Granulocytes 0 %   Abs Immature Granulocytes 0.04 0.00 - 0.07 K/uL    Comment: Performed at [facility], [address], [place], [place] [address]  Comprehensive metabolic panel     Status: Abnormal   Collection Time: 03/04/20  8:38 PM  Result Value Ref Range   Sodium 136 135 - 145 mmol/L   Potassium 3.5 3.5 - 5.1 mmol/L   Chloride 102 98 - 111 mmol/L  CO2 24 22 - 32 mmol/L   Glucose, Bld 114 (H) 70 - 99 mg/dL    Comment: Glucose reference range applies only to samples taken after fasting Joyce at least 8 hours.   BUN 9 6 - 20 mg/dL   Creatinine, Ser 0.85 0.44 - 1.00 mg/dL   Calcium 9.3  8.9 - 10.3 mg/dL   Total Protein 8.2 (H) 6.5 - 8.1 g/dL   Albumin 3.9 3.5 - 5.0 g/dL   AST 20 15 - 41 U/L   ALT 22 0 - 44 U/L   Alkaline Phosphatase 47 38 - 126 U/L   Total Bilirubin 0.6 0.3 - 1.2 mg/dL   GFR calc non Af Amer >60 >60 mL/min   Anion gap 10 5 - 15    Comment: Performed at [facility], [address], [place], [place] [address]  HIV Antibody (routine testing w rflx)     Status: None   Collection Time: 03/04/20  8:38 PM  Result Value Ref Range   HIV Screen 4th Generation wRfx Non Reactive Non Reactive    Comment: Performed at [facility], [place] [address], [place], [place] [address]  Joyce-stat chem 8, ED     Status: Abnormal   Collection Time: 03/04/20  8:42 PM  Result Value Ref Range   Sodium 142 135 - 145 mmol/L   Potassium 3.7 3.5 - 5.1 mmol/L   Chloride 103 98 - 111 mmol/L   BUN 7 6 - 20 mg/dL   Creatinine, Ser 0.80 0.44 - 1.00 mg/dL   Glucose, Bld 108 (H) 70 - 99 mg/dL    Comment: Glucose reference range applies only to samples taken after fasting Joyce at least 8 hours.   Calcium, Ion 1.21 1.15 - 1.40 mmol/L   TCO2 24 22 - 32 mmol/L   Hemoglobin 16.3 (H) 12.0 - 15.0 g/dL   HCT 48.0 (H) 36 - 46 %  Respiratory Panel by RT PCR (Flu A&B, Covid) - Nasopharyngeal Swab     Status: None   Collection Time: 03/04/20 10:34 PM   Specimen: Nasopharyngeal Swab  Result Value Ref Range   SARS Coronavirus 2 by RT PCR NEGATIVE NEGATIVE    Comment: (NOTE) SARS-CoV-2 target nucleic acids are NOT DETECTED.  Joyce SARS-CoV-2 RNA is generally detectable in upper respiratoy specimens during Joyce acute phase of infection. Joyce lowest concentration of SARS-CoV-2 viral copies [name] assay can detect is 131 copies/mL. A negative result does not preclude SARS-Cov-2 infection and should not be used as Joyce sole basis Joyce treatment or other Joyce management decisions. A negative result may occur with  improper specimen collection/handling, submission of specimen other than nasopharyngeal  swab, presence of viral mutation(s) within Joyce areas targeted by [name] assay, and inadequate number of viral copies (<131 copies/mL). A negative result must be combined with clinical observations, Joyce history, and epidemiological information. Joyce expected result is Negative.  Fact Sheet Joyce Patients:  PinkCheek.be  Fact Sheet Joyce Healthcare Providers:  GravelBags.it  [name] test is no t yet approved or cleared by Joyce [place] FDA and  has been authorized Joyce detection and/or diagnosis of SARS-CoV-2 by FDA under an Emergency Use Authorization (EUA). [name] EUA will remain  in effect (meaning [name] test can be used) Joyce Joyce duration of Joyce COVID-19 declaration under Section 564(b)(1) of Joyce Act, 21 U.S.C. section 360bbb-3(b)(1), unless Joyce authorization is terminated or revoked sooner.     Influenza A by PCR NEGATIVE NEGATIVE   Influenza B by PCR  NEGATIVE NEGATIVE    Comment: (NOTE) Joyce Xpert Xpress SARS-CoV-2/FLU/RSV assay is intended as an aid in  Joyce diagnosis of influenza from Nasopharyngeal swab specimens and  should not be used as a sole basis Joyce treatment. Nasal washings and  aspirates are unacceptable Joyce Xpert Xpress SARS-CoV-2/FLU/RSV  testing.  Fact Sheet Joyce Patients: PinkCheek.be  Fact Sheet Joyce Healthcare Providers: GravelBags.it  [name] test is not yet approved or cleared by Joyce [place] FDA and  has been authorized Joyce detection and/or diagnosis of SARS-CoV-2 by  FDA under an Emergency Use Authorization (EUA). [name] EUA will remain  in effect (meaning [name] test can be used) Joyce Joyce duration of Joyce  Covid-19 declaration under Section 564(b)(1) of Joyce Act, 21  U.S.C. section 360bbb-3(b)(1), unless Joyce authorization is  terminated or revoked. Performed at [facility], [address], [place], [place] [address]   Comprehensive metabolic panel      Status: Abnormal   Collection Time: 03/05/20  4:56 AM  Result Value Ref Range   Sodium 138 135 - 145 mmol/L   Potassium 3.5 3.5 - 5.1 mmol/L   Chloride 103 98 - 111 mmol/L   CO2 25 22 - 32 mmol/L   Glucose, Bld 104 (H) 70 - 99 mg/dL    Comment: Glucose reference range applies only to samples taken after fasting Joyce at least 8 hours.   BUN 12 6 - 20 mg/dL   Creatinine, Ser 0.72 0.44 - 1.00 mg/dL   Calcium 8.9 8.9 - 10.3 mg/dL   Total Protein 6.9 6.5 - 8.1 g/dL   Albumin 3.4 (L) 3.5 - 5.0 g/dL   AST 18 15 - 41 U/L   ALT 21 0 - 44 U/L   Alkaline Phosphatase 40 38 - 126 U/L   Total Bilirubin 0.4 0.3 - 1.2 mg/dL   GFR calc non Af Amer >60 >60 mL/min   Anion gap 10 5 - 15    Comment: Performed at [facility], [address], [place], [place] [address]  CBC     Status: Abnormal   Collection Time: 03/05/20  4:56 AM  Result Value Ref Range   WBC 9.3 4.0 - 10.5 K/uL   RBC 5.32 (H) 3.87 - 5.11 MIL/uL   Hemoglobin 13.9 12.0 - 15.0 g/dL   HCT 43.1 36 - 46 %   MCV 81.0 80.0 - 100.0 fL   MCH 26.1 26.0 - 34.0 pg   MCHC 32.3 30.0 - 36.0 g/dL   RDW 14.3 11.5 - 15.5 %   Platelets 271 150 - 400 K/uL   nRBC 0.0 0.0 - 0.2 %    Comment: Performed at [facility], [address] [place], [place] [address]  Protime-INR     Status: None   Collection Time: 03/05/20  4:56 AM  Result Value Ref Range   Prothrombin Time 13.3 11.4 - 15.2 seconds   INR 1.1 0.8 - 1.2    Comment: (NOTE) INR goal varies based on device and disease states. Performed at [facility], [address] [place], [place] [address]   APTT     Status: None   Collection Time: 03/05/20  4:56 AM  Result Value Ref Range   aPTT 30 24 - 36 seconds    Comment: Performed at [facility], [address] [place],  [address]  Magnesium     Status: None   Collection Time: 03/05/20  4:56 AM  Result Value Ref Range   Magnesium 2.0 1.7 - 2.4 mg/dL  Comment: Performed at [facility], [address], [place], [place] [address]   Phosphorus     Status: None   Collection Time: 03/05/20  4:56 AM  Result Value Ref Range   Phosphorus 3.8 2.5 - 4.6 mg/dL    Comment: Performed at [facility], [address], [place], [place] [address]  Lipid panel     Status: Abnormal   Collection Time: 03/05/20  4:56 AM  Result Value Ref Range   Cholesterol 142 0 - 200 mg/dL   Triglycerides 57 <150 mg/dL   HDL 35 (L) >40 mg/dL   Total CHOL/HDL Ratio 4.1 RATIO   VLDL 11 0 - 40 mg/dL   LDL Cholesterol 96 0 - 99 mg/dL    Comment:        Total Cholesterol/HDL:CHD Risk Coronary Heart Disease Risk Table                     Men   Women  1/2 Average Risk   3.4   3.3  Average Risk       5.0   4.4  2 X Average Risk   9.6   7.1  3 X Average Risk  23.4   11.0        Use Joyce calculated Joyce Ratio above and Joyce CHD Risk Table to determine Joyce Joyce's CHD Risk.        ATP III CLASSIFICATION (LDL):  <100     mg/dL   Optimal  100-129  mg/dL   Near or Above                    Optimal  130-159  mg/dL   Borderline  160-189  mg/dL   High  >190     mg/dL   Very High Performed at [place] [place], [place] [address]   Hemoglobin A1c     Status: None   Collection Time: 03/05/20  4:56 AM  Result Value Ref Range   Hgb A1c MFr Bld 5.6 4.8 - 5.6 %    Comment: (NOTE) Pre diabetes:          5.7%-6.4%  Diabetes:              >6.4%  Glycemic [name] Joyce   <7.0% adults with diabetes    Mean Plasma Glucose 114.02 mg/dL    Comment: Performed at [facility] [address], [place], [place] [address]  TSH     Status: Abnormal   Collection Time: 03/05/20  4:56 AM  Result Value Ref Range   TSH 0.283 (L) 0.350 - 4.500 uIU/mL    Comment: Performed by a 3rd Generation assay with a functional sensitivity of <=0.01 uIU/mL. Performed at [facility], [address] [place], [place] [address]   Troponin Joyce (High Sensitivity)     Status: None   Collection Time: 03/05/20  4:56 AM  Result Value Ref Range   Troponin Joyce  (High Sensitivity) 4 <18 ng/L    Comment: (NOTE) Elevated high sensitivity troponin Joyce (hsTnI) values and significant  changes across serial measurements may suggest ACS but many other  chronic and acute conditions are known to elevate hsTnI results.  Refer to Joyce "Links" section Joyce chest pain algorithms and Joyce  guidance. Performed at [facility], [address] [place], [place] [address]   Troponin Joyce (High Sensitivity)     Status: None   Collection Time: 03/05/20 10:06 AM  Result Value Ref  Range   Troponin Joyce (High Sensitivity) 4 <18 ng/L    Comment: (NOTE) Elevated high sensitivity troponin Joyce (hsTnI) values and significant  changes across serial measurements may suggest ACS but many other  chronic and acute conditions are known to elevate hsTnI results.  Refer to Joyce "Links" section Joyce chest pain algorithms and Joyce  guidance. Performed at [facility], [address], [place], [place] [address]     Studies/Results:   TTE 1. Left ventricular ejection fraction, by estimation, is 60 to 65%. Joyce  left ventricle has normal function. Joyce left ventricle has no regional  wall motion abnormalities. There is mild left ventricular hypertrophy.  Left ventricular diastolic parameters  are indeterminate.  2. Right ventricular systolic function is normal. Joyce right ventricular  size is normal.  3. Joyce mitral valve is normal in structure. No evidence of mitral valve  regurgitation. No evidence of mitral stenosis.  4. Joyce aortic valve is tricuspid. Aortic valve regurgitation is not  visualized. No aortic stenosis is present.  5. Joyce inferior vena cava is normal in size with greater than 50%  respiratory variability, suggesting right atrial pressure of 3 mmHg.       CAROTID IMPRESSION: Color duplex indicates minimal homogeneous plaque, with no hemodynamically significant stenosis by duplex criteria in Joyce extracranial cerebrovascular circulation.     BRAIN  MRI MRA FINDINGS: MRI HEAD  Brain: There is an 8 mm focus of restricted diffusion involving Joyce lateral right thalamus. There is no acute infarction or intracranial hemorrhage. There is no intracranial mass, mass effect, or edema. There is no hydrocephalus or extra-axial fluid collection. Ventricles and sulci are normal in size and configuration. Patchy T2 hyperintensity in Joyce supratentorial white matter is nonspecific may reflect minor chronic microvascular ischemic changes.  Vascular: Major vessel flow voids at Joyce skull base are preserved.  Skull and upper cervical spine: Normal marrow signal is preserved.  Sinuses/Orbits: Paranasal sinuses are aerated. Orbits are unremarkable.  Other: Sella is unremarkable.  Mastoid air cells are clear.  MRA HEAD  Intracranial internal carotid arteries are patent. Middle and anterior cerebral arteries are patent. Intracranial vertebral arteries, basilar artery, posterior cerebral arteries are patent. There is no significant stenosis or aneurysm.  IMPRESSION: Acute subcentimeter infarct of Joyce lateral right thalamus.  No proximal intracranial vessel occlusion or significant stenosis.    Joyce brain MRI scan is reviewed in person and shows a small infarct involving Joyce lateral thalamic area on Joyce right side.  There is also about 4 small deep white matter lesions noted along with frontal periventricular hyperintensity involving Joyce frontal horn of Joyce lateral ventricles.  No hemorrhages noted.  MRA is normal.    Paullette Mckain A. Joyce [name], M.D.  Diplomate, Tax adviser of Psychiatry and Neurology ( Neurology). 03/06/2020, 3:09 PM

## 2020-03-07 ENCOUNTER — Encounter (HOSPITAL_COMMUNITY): Payer: Self-pay | Admitting: Family Medicine

## 2020-03-07 LAB — CBC
HCT: 42.6 % (ref 36.0–46.0)
Hemoglobin: 13.6 g/dL (ref 12.0–15.0)
MCH: 26.4 pg (ref 26.0–34.0)
MCHC: 31.9 g/dL (ref 30.0–36.0)
MCV: 82.7 fL (ref 80.0–100.0)
Platelets: 279 10*3/uL (ref 150–400)
RBC: 5.15 MIL/uL — ABNORMAL HIGH (ref 3.87–5.11)
RDW: 14.4 % (ref 11.5–15.5)
WBC: 9.2 10*3/uL (ref 4.0–10.5)
nRBC: 0 % (ref 0.0–0.2)

## 2020-03-07 LAB — COMPREHENSIVE METABOLIC PANEL
ALT: 17 U/L (ref 0–44)
AST: 13 U/L — ABNORMAL LOW (ref 15–41)
Albumin: 3.1 g/dL — ABNORMAL LOW (ref 3.5–5.0)
Alkaline Phosphatase: 38 U/L (ref 38–126)
Anion gap: 9 (ref 5–15)
BUN: 15 mg/dL (ref 6–20)
CO2: 25 mmol/L (ref 22–32)
Calcium: 8.8 mg/dL — ABNORMAL LOW (ref 8.9–10.3)
Chloride: 104 mmol/L (ref 98–111)
Creatinine, Ser: 0.73 mg/dL (ref 0.44–1.00)
GFR, Estimated: >60 mL/min (ref >60)
Glucose, Bld: 91 mg/dL (ref 70–99)
Potassium: 3.6 mmol/L (ref 3.5–5.1)
Sodium: 138 mmol/L (ref 135–145)
Total Bilirubin: 0.4 mg/dL (ref 0.3–1.2)
Total Protein: 6.3 g/dL — ABNORMAL LOW (ref 6.5–8.1)

## 2020-03-07 LAB — PHOSPHORUS: Phosphorus: 3.5 mg/dL (ref 2.5–4.6)

## 2020-03-07 LAB — RPR: RPR Ser Ql: NONREACTIVE

## 2020-03-07 LAB — MAGNESIUM: Magnesium: 2 mg/dL (ref 1.7–2.4)

## 2020-03-07 MED ORDER — TRAZODONE HCL 50 MG PO TABS
50.0000 mg | ORAL_TABLET | Freq: Every evening | ORAL | Status: DC | PRN
  Administered 2020-03-07 – 2020-03-08 (×2): 50 mg via ORAL
  Filled 2020-03-07 (×2): qty 1

## 2020-03-07 NOTE — Progress Notes (Signed)
PROGRESS NOTE    Joyce [name]  FTD:[id number] DOB: [birth year] DOA: 03/04/2020 PCP: Patient, No Pcp Per   Brief Narrative:  This 44 years old female with PMH significant for hypertension, depression, migraine presents in the ED with left-sided numbness involving upper and lower extremities.  She has worsening symptoms, with associated weakness , She fell 3 times afterwards.  In the ED she had CT head which was negative for stroke. Tele- neurologist was consulted,  admitted for a stroke work-up.  Carotid duplex: no hemodynamically significant stenosis.  Echocardiogram unremarkable . 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 No proximal intracranial vessel occlusion or significant stenosis. PT recommended CIR.  Assessment & Plan:   Principal Problem:   Acute CVA (cerebrovascular accident) [facility]) Active Problems:   Essential hypertension   Recurrent falls   Leukocytosis   Acute left-sided weakness  Acute left-sided weakness and numbness associated with recurrent falls sec. to Right thalamic CVA. CT of head without contrast showed normal CT. Teleneurologist was consulted and recommended further stroke work-up Patient  admitted to telemetry unit  Bilateral carotid ultrasound : No hemodynamically significant stenosis. Echocardiogram : LVEF 60 to 65%.  No regional wall motion abnormalities. unremarkable. MRI of brain without contrast:  Acute subcentimeter infarct of the lateral right thalamus.  Continue aspirin 325 mg po daily. Continue fall precautions and neuro checks. Hemoglobin A1c 5.6, lipid profile WNL LDL 96 PT recommended CIR. Patient passed bedside swallow eval by nursing Neurology recommended dual antiplatelet agents for 1 month followed by aspirin 325 mg alone. Patient continues to have significant post thalamic CVA pain on the left side and numbness. Started on Topamax.  Leukocytosis possibly reactive >> Resolved. WBC 12.2, no sign of acute  infectious process at this time. Continue to monitor WBC with morning labs  Essential hypertension (controlled) Permissive hypertension will be allowed at this time.   DVT prophylaxis: SCDs. Code Status: Full Family Communication: Husband at bed side. Disposition Plan:   Status is: Inpatient  Remains inpatient appropriate because:Inpatient level of care appropriate due to severity of illness   Dispo: The patient is from: Home              Anticipated d/c is to: CIR              Anticipated d/c date is: 2 days              Patient currently is not medically stable to d/c.  Consultants:   Teleneurologist  Procedures:   Antimicrobials: Anti-infectives (From admission, onward)   None      Subjective: Patient was seen and examined at bedside. Overnight events noted.  She reports pain on left side associated with persistent numbness, reports slightly better but still has symptoms.  She reports she was not able to walk much. Objective: Vitals:   03/06/20 0553 03/06/20 1327 03/06/20 2123 03/07/20 0555  BP: 110/64 (!) 144/92 (!) 142/86 124/81  Pulse: 79 89 84 80  Resp: 16 18 19 18   Temp: 98.2 F (36.8 C) 98.3 F (36.8 C) 98.2 F (36.8 C) 98.6 F (37 C)  TempSrc: Oral Oral Oral Oral  SpO2: 100% 100% 100% 100%  Weight:      Height:        Intake/Output Summary (Last 24 hours) at 03/07/2020 1155 Last data filed at 03/07/2020 0900 Gross per 24 hour  Intake 480 ml  Output 950 ml  Net -470 ml   Filed Weights   03/04/20 2032  03/05/20 0918  Weight: 102.3 kg 97.9 kg    Examination:  General exam: Appears calm and comfortable.  Respiratory system: Clear to auscultation. Respiratory effort normal. Cardiovascular system: S1 & S2 heard, RRR. No JVD, murmurs, rubs, gallops or clicks. No pedal edema. Gastrointestinal system: Abdomen is nondistended, soft and nontender. No organomegaly or masses felt. Normal bowel sounds heard. Central nervous system: Alert and oriented x  3 . Numbness and weakness noted on the left side. Extremities: No edema, no cyanosis no clubbing. Skin: No rashes, lesions or ulcers Psychiatry: Judgement and insight appear normal. Mood & affect appropriate.     Data Reviewed: I have personally reviewed following labs and imaging studies  CBC: Recent Labs  Lab 03/04/20 2038 03/04/20 2042 03/05/20 0456 03/07/20 0601  WBC 12.2*  --  9.3 9.2  NEUTROABS 8.3*  --   --   --   HGB 15.4* 16.3* 13.9 13.6  HCT 46.2* 48.0* 43.1 42.6  MCV 80.9  --  81.0 82.7  PLT 301  --  271 010   Basic Metabolic Panel: Recent Labs  Lab 03/04/20 2038 03/04/20 2042 03/05/20 0456 03/07/20 0601  NA 136 142 138 138  K 3.5 3.7 3.5 3.6  CL 102 103 103 104  CO2 24  --  25 25  GLUCOSE 114* 108* 104* 91  BUN 9 7 12 15   CREATININE 0.85 0.80 0.72 0.73  CALCIUM 9.3  --  8.9 8.8*  MG  --   --  2.0 2.0  PHOS  --   --  3.8 3.5   GFR: Estimated Creatinine Clearance: 106.9 mL/min (by C-G formula based on SCr of 0.73 mg/dL). Liver Function Tests: Recent Labs  Lab 03/04/20 2038 03/05/20 0456 03/07/20 0601  AST 20 18 13*  ALT 22 21 17   ALKPHOS 47 40 38  BILITOT 0.6 0.4 0.4  PROT 8.2* 6.9 6.3*  ALBUMIN 3.9 3.4* 3.1*   No results for input(s): LIPASE, AMYLASE in the last 168 hours. No results for input(s): AMMONIA in the last 168 hours. Coagulation Profile: Recent Labs  Lab 03/04/20 2038 03/05/20 0456  INR 1.0 1.1   Cardiac Enzymes: No results for input(s): CKTOTAL, CKMB, CKMBINDEX, TROPONINI in the last 168 hours. BNP (last 3 results) No results for input(s): PROBNP in the last 8760 hours. HbA1C: Recent Labs    03/05/20 0456  HGBA1C 5.6   CBG: Recent Labs  Lab 03/04/20 2033  GLUCAP 142*   Lipid Profile: Recent Labs    03/05/20 0456  CHOL 142  HDL 35*  LDLCALC 96  TRIG 57  CHOLHDL 4.1   Thyroid Function Tests: Recent Labs    03/06/20 1837  TSH 0.523   Anemia Panel: Recent Labs    03/06/20 1837  VITAMINB12 363    Sepsis Labs: No results for input(s): PROCALCITON, LATICACIDVEN in the last 168 hours.  Recent Results (from the past 240 hour(s))  Respiratory Panel by RT PCR (Flu A&B, Covid) - Nasopharyngeal Swab     Status: None   Collection Time: 03/04/20 10:34 PM   Specimen: Nasopharyngeal Swab  Result Value Ref Range Status   SARS Coronavirus 2 by RT PCR NEGATIVE NEGATIVE Final    Comment: (NOTE) SARS-CoV-2 target nucleic acids are NOT DETECTED.  The SARS-CoV-2 RNA is generally detectable in upper respiratoy specimens during the acute phase of infection. The lowest concentration of SARS-CoV-2 viral copies this assay can detect is 131 copies/mL. A negative result does not preclude SARS-Cov-2 infection and should not  be used as the sole basis for treatment or other patient management decisions. A negative result may occur with  improper specimen collection/handling, submission of specimen other than nasopharyngeal swab, presence of viral mutation(s) within the areas targeted by this assay, and inadequate number of viral copies (<131 copies/mL). A negative result must be combined with clinical observations, patient history, and epidemiological information. The expected result is Negative.  Fact Sheet for Patients:  PinkCheek.be  Fact Sheet for Healthcare Providers:  GravelBags.it  This test is no t yet approved or cleared by the [place] FDA and  has been authorized for detection and/or diagnosis of SARS-CoV-2 by FDA under an Emergency Use Authorization (EUA). This EUA will remain  in effect (meaning this test can be used) for the duration of the COVID-19 declaration under Section 564(b)(1) of the Act, 21 U.S.C. section 360bbb-3(b)(1), unless the authorization is terminated or revoked sooner.     Influenza A by PCR NEGATIVE NEGATIVE Final   Influenza B by PCR NEGATIVE NEGATIVE Final    Comment: (NOTE) The Xpert Xpress  SARS-CoV-2/FLU/RSV assay is intended as an aid in  the diagnosis of influenza from Nasopharyngeal swab specimens and  should not be used as a sole basis for treatment. Nasal washings and  aspirates are unacceptable for Xpert Xpress SARS-CoV-2/FLU/RSV  testing.  Fact Sheet for Patients: PinkCheek.be  Fact Sheet for Healthcare Providers: GravelBags.it  This test is not yet approved or cleared by the [place] FDA and  has been authorized for detection and/or diagnosis of SARS-CoV-2 by  FDA under an Emergency Use Authorization (EUA). This EUA will remain  in effect (meaning this test can be used) for the duration of the  Covid-19 declaration under Section 564(b)(1) of the Act, 21  U.S.C. section 360bbb-3(b)(1), unless the authorization is  terminated or revoked. Performed at [facility], [address] [place], [place] [address]      Radiology Studies: MR ANGIO HEAD WO CONTRAST  Result Date: 03/05/2020 CLINICAL DATA:  Left sided numbness EXAM: MRI HEAD WITHOUT CONTRAST MRA HEAD WITHOUT CONTRAST TECHNIQUE: Multiplanar, multiecho pulse sequences of the brain and surrounding structures were obtained without intravenous contrast. Angiographic images of the head were obtained using MRA technique without contrast. COMPARISON:  None. FINDINGS: MRI HEAD Brain: There is an 8 mm focus of restricted diffusion involving the lateral right thalamus. There is no acute infarction or intracranial hemorrhage. There is no intracranial mass, mass effect, or edema. There is no hydrocephalus or extra-axial fluid collection. Ventricles and sulci are normal in size and configuration. Patchy T2 hyperintensity in the supratentorial white matter is nonspecific may reflect minor chronic microvascular ischemic changes. Vascular: Major vessel flow voids at the skull base are preserved. Skull and upper cervical spine: Normal marrow signal is preserved.  Sinuses/Orbits: Paranasal sinuses are aerated. Orbits are unremarkable. Other: Sella is unremarkable.  Mastoid air cells are clear. MRA HEAD Intracranial internal carotid arteries are patent. Middle and anterior cerebral arteries are patent. Intracranial vertebral arteries, basilar artery, posterior cerebral arteries are patent. There is no significant stenosis or aneurysm. IMPRESSION: Acute subcentimeter infarct of the lateral right thalamus. No proximal intracranial vessel occlusion or significant stenosis. Electronically Signed   By: Macy Mis M.D.   On: 03/05/2020 12:40   MR BRAIN WO CONTRAST  Result Date: 03/05/2020 CLINICAL DATA:  Left sided numbness EXAM: MRI HEAD WITHOUT CONTRAST MRA HEAD WITHOUT CONTRAST TECHNIQUE: Multiplanar, multiecho pulse sequences of the brain and surrounding structures were obtained without intravenous contrast. Angiographic  images of the head were obtained using MRA technique without contrast. COMPARISON:  None. FINDINGS: MRI HEAD Brain: There is an 8 mm focus of restricted diffusion involving the lateral right thalamus. There is no acute infarction or intracranial hemorrhage. There is no intracranial mass, mass effect, or edema. There is no hydrocephalus or extra-axial fluid collection. Ventricles and sulci are normal in size and configuration. Patchy T2 hyperintensity in the supratentorial white matter is nonspecific may reflect minor chronic microvascular ischemic changes. Vascular: Major vessel flow voids at the skull base are preserved. Skull and upper cervical spine: Normal marrow signal is preserved. Sinuses/Orbits: Paranasal sinuses are aerated. Orbits are unremarkable. Other: Sella is unremarkable.  Mastoid air cells are clear. MRA HEAD Intracranial internal carotid arteries are patent. Middle and anterior cerebral arteries are patent. Intracranial vertebral arteries, basilar artery, posterior cerebral arteries are patent. There is no significant stenosis or  aneurysm. IMPRESSION: Acute subcentimeter infarct of the lateral right thalamus. No proximal intracranial vessel occlusion or significant stenosis. Electronically Signed   By: Macy Mis M.D.   On: 03/05/2020 12:40    Scheduled Meds: . aspirin EC  325 mg Oral Daily  . atorvastatin  80 mg Oral Daily  . clopidogrel  75 mg Oral Q breakfast  . lidocaine  1 patch Transdermal Q24H  . topiramate  25 mg Oral Daily   Continuous Infusions:   LOS: 2 days    Time spent: 25 mins.    Shawna Clamp, MD [facility]   If 7PM-7AM, please contact night-coverage

## 2020-03-08 NOTE — Progress Notes (Signed)
PROGRESS NOTE    Joyce [name]  SAY:[id number] DOB: 06-11-1975 DOA: 03/04/2020 PCP: Patient, No Pcp Per   Brief Narrative:  This 44 years old female with PMH significant for hypertension, depression, migraine presents in the ED with left-sided numbness involving upper and lower extremities.  She has worsening symptoms, with associated weakness , She fell 3 times afterwards.  In the ED she had CT head which was negative for stroke. Tele- neurologist was consulted,  admitted for a stroke work-up.  Carotid duplex: no hemodynamically significant stenosis.  Echocardiogram unremarkable . 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 No proximal intracranial vessel occlusion or significant stenosis. PT recommended CIR.  Assessment & Plan:   Principal Problem:   Acute CVA (cerebrovascular accident) [facility]) Active Problems:   Essential hypertension   Recurrent falls   Leukocytosis   Acute left-sided weakness  Acute left-sided weakness and numbness associated with recurrent falls sec. to Right thalamic CVA. CT of head without contrast showed normal CT. Teleneurologist was consulted and recommended further stroke work-up Patient  admitted to telemetry unit  Bilateral carotid ultrasound : No hemodynamically significant stenosis. Echocardiogram : LVEF 60 to 65%.  No regional wall motion abnormalities. unremarkable. MRI of brain without contrast:  Acute subcentimeter infarct of the lateral right thalamus.  Continue aspirin 325 mg po daily. Continue fall precautions and neuro checks. Hemoglobin A1c 5.6, lipid profile WNL LDL 96 PT recommended CIR. Patient passed bedside swallow eval by nursing Neurology recommended dual antiplatelet agents for 1 month followed by aspirin 325 mg alone. Patient continues to have significant post thalamic CVA pain on the left side and numbness. Started on Topamax. She reports improvement in pain. Continue Lipitor 80 mg po daily , LDL goal  below 70.  Leukocytosis possibly reactive >> Resolved. WBC 12.2, no sign of acute infectious process at this time. Continue to monitor WBC with morning labs  Essential hypertension (controlled) Permissive hypertension will be allowed at this time.   DVT prophylaxis: SCDs. Code Status: Full Family Communication: Husband at bed side. Disposition Plan:   Status is: Inpatient  Remains inpatient appropriate because:Inpatient level of care appropriate due to severity of illness   Dispo: The patient is from: Home              Anticipated d/c is to: CIR              Anticipated d/c date is: 03/09/2020              Patient currently is not medically stable to d/c.   Barriers to discharge:   CIR  Bed available on 03/09/20.  Consultants:   Teleneurologist  Procedures:   Antimicrobials: Anti-infectives (From admission, onward)   None      Subjective: Patient was seen and examined at bedside. Overnight events noted.  She reports pain and numbness has improved but still has tingling in left hand fingers.   Objective: Vitals:   03/07/20 0555 03/07/20 1337 03/07/20 2057 03/08/20 0533  BP: 124/81 132/71 (!) 153/73 (!) 145/95  Pulse: 80 87 95 (!) 104  Resp: 18 18 18 18   Temp: 98.6 F (37 C) 98.5 F (36.9 C) 99.1 F (37.3 C) 98.4 F (36.9 C)  TempSrc: Oral Oral Oral Oral  SpO2: 100% 100% 98% 100%  Weight:      Height:        Intake/Output Summary (Last 24 hours) at 03/08/2020 1038 Last data filed at 03/07/2020 1700 Gross per 24 hour  Intake 480 ml  Output --  Net 480 ml   Filed Weights   03/04/20 2032 03/05/20 0918  Weight: 102.3 kg 97.9 kg    Examination:  General exam: Appears calm and comfortable.  Respiratory system: Clear to auscultation. Respiratory effort normal. Cardiovascular system: S1 & S2 heard, RRR. No JVD, murmurs, rubs, gallops or clicks. No pedal edema. Gastrointestinal system: Abdomen is nondistended, soft and nontender. No organomegaly or  masses felt. Normal bowel sounds heard. Central nervous system: Alert and oriented x 3 . Numbness and weakness noted on the left side. Extremities: No edema, no cyanosis no clubbing. Skin: No rashes, lesions or ulcers Psychiatry: Judgement and insight appear normal. Mood & affect appropriate.     Data Reviewed: I have personally reviewed following labs and imaging studies  CBC: Recent Labs  Lab 03/04/20 2038 03/04/20 2042 03/05/20 0456 03/07/20 0601  WBC 12.2*  --  9.3 9.2  NEUTROABS 8.3*  --   --   --   HGB 15.4* 16.3* 13.9 13.6  HCT 46.2* 48.0* 43.1 42.6  MCV 80.9  --  81.0 82.7  PLT 301  --  271 785   Basic Metabolic Panel: Recent Labs  Lab 03/04/20 2038 03/04/20 2042 03/05/20 0456 03/07/20 0601  NA 136 142 138 138  K 3.5 3.7 3.5 3.6  CL 102 103 103 104  CO2 24  --  25 25  GLUCOSE 114* 108* 104* 91  BUN 9 7 12 15   CREATININE 0.85 0.80 0.72 0.73  CALCIUM 9.3  --  8.9 8.8*  MG  --   --  2.0 2.0  PHOS  --   --  3.8 3.5   GFR: Estimated Creatinine Clearance: 106.9 mL/min (by C-G formula based on SCr of 0.73 mg/dL). Liver Function Tests: Recent Labs  Lab 03/04/20 2038 03/05/20 0456 03/07/20 0601  AST 20 18 13*  ALT 22 21 17   ALKPHOS 47 40 38  BILITOT 0.6 0.4 0.4  PROT 8.2* 6.9 6.3*  ALBUMIN 3.9 3.4* 3.1*   No results for input(s): LIPASE, AMYLASE in the last 168 hours. No results for input(s): AMMONIA in the last 168 hours. Coagulation Profile: Recent Labs  Lab 03/04/20 2038 03/05/20 0456  INR 1.0 1.1   Cardiac Enzymes: No results for input(s): CKTOTAL, CKMB, CKMBINDEX, TROPONINI in the last 168 hours. BNP (last 3 results) No results for input(s): PROBNP in the last 8760 hours. HbA1C: No results for input(s): HGBA1C in the last 72 hours. CBG: Recent Labs  Lab 03/04/20 2033  GLUCAP 142*   Lipid Profile: No results for input(s): CHOL, HDL, LDLCALC, TRIG, CHOLHDL, LDLDIRECT in the last 72 hours. Thyroid Function Tests: Recent Labs     03/06/20 1837  TSH 0.523   Anemia Panel: Recent Labs    03/06/20 1837  VITAMINB12 363   Sepsis Labs: No results for input(s): PROCALCITON, LATICACIDVEN in the last 168 hours.  Recent Results (from the past 240 hour(s))  Respiratory Panel by RT PCR (Flu A&B, Covid) - Nasopharyngeal Swab     Status: None   Collection Time: 03/04/20 10:34 PM   Specimen: Nasopharyngeal Swab  Result Value Ref Range Status   SARS Coronavirus 2 by RT PCR NEGATIVE NEGATIVE Final    Comment: (NOTE) SARS-CoV-2 target nucleic acids are NOT DETECTED.  The SARS-CoV-2 RNA is generally detectable in upper respiratoy specimens during the acute phase of infection. The lowest concentration of SARS-CoV-2 viral copies this assay can detect is 131 copies/mL. A negative result does  not preclude SARS-Cov-2 infection and should not be used as the sole basis for treatment or other patient management decisions. A negative result may occur with  improper specimen collection/handling, submission of specimen other than nasopharyngeal swab, presence of viral mutation(s) within the areas targeted by this assay, and inadequate number of viral copies (<131 copies/mL). A negative result must be combined with clinical observations, patient history, and epidemiological information. The expected result is Negative.  Fact Sheet for Patients:  PinkCheek.be  Fact Sheet for Healthcare Providers:  GravelBags.it  This test is no t yet approved or cleared by the [place] FDA and  has been authorized for detection and/or diagnosis of SARS-CoV-2 by FDA under an Emergency Use Authorization (EUA). This EUA will remain  in effect (meaning this test can be used) for the duration of the COVID-19 declaration under Section 564(b)(1) of the Act, 21 U.S.C. section 360bbb-3(b)(1), unless the authorization is terminated or revoked sooner.     Influenza A by PCR NEGATIVE NEGATIVE  Final   Influenza B by PCR NEGATIVE NEGATIVE Final    Comment: (NOTE) The Xpert Xpress SARS-CoV-2/FLU/RSV assay is intended as an aid in  the diagnosis of influenza from Nasopharyngeal swab specimens and  should not be used as a sole basis for treatment. Nasal washings and  aspirates are unacceptable for Xpert Xpress SARS-CoV-2/FLU/RSV  testing.  Fact Sheet for Patients: PinkCheek.be  Fact Sheet for Healthcare Providers: GravelBags.it  This test is not yet approved or cleared by the [place] FDA and  has been authorized for detection and/or diagnosis of SARS-CoV-2 by  FDA under an Emergency Use Authorization (EUA). This EUA will remain  in effect (meaning this test can be used) for the duration of the  Covid-19 declaration under Section 564(b)(1) of the Act, 21  U.S.C. section 360bbb-3(b)(1), unless the authorization is  terminated or revoked. Performed at [facility], [address] [place], [place] [address]      Radiology Studies: No results found.  Scheduled Meds: . aspirin EC  325 mg Oral Daily  . atorvastatin  80 mg Oral Daily  . clopidogrel  75 mg Oral Q breakfast  . lidocaine  1 patch Transdermal Q24H  . topiramate  25 mg Oral Daily   Continuous Infusions:   LOS: 3 days    Time spent: 25 mins.    Shawna Clamp, MD [facility]   If 7PM-7AM, please contact night-coverage

## 2020-03-09 LAB — HOMOCYSTEINE: Homocysteine: 8.6 umol/L (ref 0.0–14.5)

## 2020-03-09 LAB — ANTINUCLEAR ANTIBODIES, IFA: ANA Ab, IFA: NEGATIVE

## 2020-03-09 NOTE — NC FL2 (Signed)
[place] LEVEL OF CARE SCREENING TOOL     IDENTIFICATION  Patient Name: Joyce [name] Birthdate: Aug 20, 1975 Sex: female Admission Date (Current Location): 03/04/2020  [facility] and [place] Number:  [employer] and Address:  [place] [address] [place]      Provider Number: [id number]  Attending Physician Name and Address:  Shawna Clamp, MD  Relative Name and Phone Number:  Olena Leatherwood (Daughter) [phone]    Current Level of Care: SNF Recommended Level of Care: [place] Prior Approval Number:    Date Approved/Denied:   PASRR Number: [id number]  Discharge Plan: SNF    Current Diagnoses: Patient Active Problem List   Diagnosis Date Noted  . Recurrent falls 03/05/2020  . Leukocytosis 03/05/2020  . Acute left-sided weakness 03/05/2020  . Acute CVA (cerebrovascular accident) ([place]) 03/04/2020  . MDD (major depressive disorder), recurrent episode, moderate ([place]) 05/01/2017  . PTSD (post-traumatic stress disorder) 05/01/2017  . Depression, recurrent ([place]) 12/07/2016  . Essential hypertension 07/20/2016  . Uterine fibroid 01/27/2016  . 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 [place] multiparity 11/26/2014    Orientation RESPIRATION BLADDER Height & Weight     Self, Time, Situation, Place  Normal Continent Weight: 215 lb 13.3 oz (97.9 kg) Height:  5\' 6"  (167.6 cm)  BEHAVIORAL SYMPTOMS/MOOD NEUROLOGICAL BOWEL NUTRITION STATUS      Continent Diet  AMBULATORY STATUS COMMUNICATION OF NEEDS Skin   Extensive Assist Verbally Normal                       Personal Care Assistance Level of Assistance  Bathing, Feeding, Dressing Bathing Assistance: Limited assistance Feeding assistance: Independent Dressing Assistance: Limited assistance     Functional Limitations Info  Sight, Hearing, Speech Sight Info: Adequate Hearing Info: Adequate Speech Info:  Adequate    SPECIAL CARE FACTORS FREQUENCY  PT (By licensed PT), OT (By licensed OT)     PT Frequency: 5x OT Frequency: 5x            Contractures Contractures Info: Not present    Additional Factors Info  Code Status, Allergies, Psychotropic Code Status Info: Full Allergies Info: N/A Psychotropic Info: topiramate         Current Medications (03/09/2020):  This is the current hospital active medication list Current Facility-Administered Medications  Medication Dose Route Frequency Provider Last Rate Last Admin  . aspirin EC tablet 325 mg  325 mg Oral Daily Phillips Odor, MD   325 mg at 03/08/20 0948  . atorvastatin (LIPITOR) tablet 80 mg  80 mg Oral Daily Shawna Clamp, MD   80 mg at 03/08/20 0948  . clopidogrel (PLAVIX) tablet 75 mg  75 mg Oral Q breakfast [place], Kofi, MD   75 mg at 03/08/20 0948  . lidocaine (LIDODERM) 5 % 1 patch  1 patch Transdermal Q24H Adefeso, Oladapo, DO   1 patch at 03/05/20 0739  . oxyCODONE (Oxy IR/ROXICODONE) immediate release tablet 5 mg  5 mg Oral Q4H PRN Adefeso, Oladapo, DO   5 mg at 03/08/20 0948  . topiramate (TOPAMAX) tablet 25 mg  25 mg Oral Daily Phillips Odor, MD   25 mg at 03/08/20 0948  . traZODone (DESYREL) tablet 50 mg  50 mg Oral QHS PRN Shawna Clamp, MD   50 mg at 03/08/20 2210     Discharge Medications: Please see discharge summary for a list of discharge medications.  Relevant Imaging  Results:  Relevant Lab Results:   Additional Information PT SSN: [id number]  Natasha Bence, LCSW

## 2020-03-09 NOTE — TOC Progression Note (Signed)
Transition of Care [facility]) - Progression Note    Patient Details  Name: Joyce [name] MRN: [id number] Date of Birth: 06-20-1975  [facility]) CM/SW Contact  Natasha Bence, LCSW Phone Number: 03/09/2020, 4:33 PM  Clinical Narrative:    CSW spoke with CIR. CIR reported that patient agreed to finanicial agreement and they will be able to take patient as soon as 10/12. TOC to follow        Expected Discharge Plan and Services                                                 Social Determinants of Health (SDOH) Interventions    Readmission Risk Interventions No flowsheet data found.

## 2020-03-09 NOTE — Progress Notes (Signed)
Inpatient Rehab Admissions Coordinator:   Spoke to pt this AM to confirm she was still interested in CIR and review cost with her.  She is still open to rehab and agreeable to come when bed available.  I do not have a bed for her today, but will potentially be able to admit her tomorrow.  Will confirm in the AM.    Shann Medal, PT, DPT Admissions Coordinator [phone] 03/09/20  11:23 AM

## 2020-03-09 NOTE — Progress Notes (Signed)
PROGRESS NOTE    Joyce [name]  XBM:[id number] DOB: 05-28-1976 DOA: 03/04/2020 PCP: Patient, No Pcp Per   Brief Narrative:  This 44 years old female with PMH significant for hypertension, depression, migraine presents in the ED with left-sided numbness involving upper and lower extremities.  She has worsening symptoms, with associated weakness , She fell 3 times afterwards.  In the ED she had CT head which was negative for stroke. Tele- neurologist was consulted,  admitted for a stroke work-up.  Carotid duplex: no hemodynamically significant stenosis.  Echocardiogram unremarkable . 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 No proximal intracranial vessel occlusion or significant stenosis. PT recommended [facility]. [facility].  Assessment & Plan:   Principal Problem:   Acute CVA (cerebrovascular accident) [facility]) Active Problems:   Essential hypertension   Recurrent falls   Leukocytosis   Acute left-sided weakness  Acute left-sided weakness and numbness associated with recurrent falls sec. to Right thalamic CVA. CT of head without contrast showed normal CT. Teleneurologist was consulted and recommended further stroke work-up Patient  admitted to telemetry unit  Bilateral carotid ultrasound : No hemodynamically significant stenosis. Echocardiogram : LVEF 60 to 65%.  No regional wall motion abnormalities. unremarkable. MRI of brain without contrast:  Acute subcentimeter infarct of the lateral right thalamus.  Continue aspirin 325 mg po daily. Continue fall precautions and neuro checks. Hemoglobin A1c 5.6, lipid profile WNL LDL 96 PT recommended CIR. Patient passed bedside swallow eval by nursing Neurology recommended dual antiplatelet agents for 1 month followed by aspirin 325 mg alone. Patient continues to have significant post thalamic CVA pain on the left side and numbness. Started on Topamax. She reports improvement in  pain. Continue Lipitor 80 mg po daily , LDL goal below 70.  Leukocytosis possibly reactive >> Resolved. WBC 12.2, no sign of acute infectious process at this time. Continue to monitor WBC with morning labs  Essential hypertension (controlled) Permissive hypertension will be allowed at this time.   DVT prophylaxis: SCDs. Code Status: Full Family Communication: Husband at bed side. Disposition Plan:   Status is: Inpatient  Remains inpatient appropriate because:Inpatient level of care appropriate due to severity of illness   Dispo: The patient is from: Home              Anticipated d/c is to: CIR              Anticipated d/c date is: 03/10/2020              Patient currently is not medically stable to d/c.   Barriers to discharge:   [facility]IR  Bed available on 03/10/20.  Consultants:   Teleneurologist  Procedures:   Antimicrobials: Anti-infectives (From admission, onward)   None      Subjective: Patient was seen and examined at bedside. Overnight events noted.  Patient reports feeling better,  still has numbness in the left hand fingers.  She reports headache and pain on the left side of the body has completely resolved with Topamax.  Objective: Vitals:   03/08/20 0533 03/08/20 1447 03/08/20 2107 03/09/20 0623  BP: (!) 145/95 125/60 132/73 136/79  Pulse: (!) 104 80 87 79  Resp: 18 20 20    Temp: 98.4 F (36.9 C) 98.7 F (37.1 C) 98.9 F (37.2 C) 98.6 F (37 C)  TempSrc: Oral Oral Oral Oral  SpO2: 100% 100% 100% 100%  Weight:      Height:  Intake/Output Summary (Last 24 hours) at 03/09/2020 1134 Last data filed at 03/09/2020 0900 Gross per 24 hour  Intake 480 ml  Output --  Net 480 ml   Filed Weights   03/04/20 2032 03/05/20 0918  Weight: 102.3 kg 97.9 kg    Examination:  General exam: Appears calm and comfortable.  Respiratory system: Clear to auscultation. Respiratory effort normal. Cardiovascular system: S1 & S2 heard, RRR. No JVD, murmurs,  rubs, gallops or clicks. No pedal edema. Gastrointestinal system: Abdomen is nondistended, soft and nontender. No organomegaly or masses felt. Normal bowel sounds heard. Central nervous system: Alert and oriented x 3 . Numbness and weakness noted on the left side. Extremities: No edema, no cyanosis no clubbing. Skin: No rashes, lesions or ulcers Psychiatry: Judgement and insight appear normal. Mood & affect appropriate.     Data Reviewed: I have personally reviewed following labs and imaging studies  CBC: Recent Labs  Lab 03/04/20 2038 03/04/20 2042 03/05/20 0456 03/07/20 0601  WBC 12.2*  --  9.3 9.2  NEUTROABS 8.3*  --   --   --   HGB 15.4* 16.3* 13.9 13.6  HCT 46.2* 48.0* 43.1 42.6  MCV 80.9  --  81.0 82.7  PLT 301  --  271 749   Basic Metabolic Panel: Recent Labs  Lab 03/04/20 2038 03/04/20 2042 03/05/20 0456 03/07/20 0601  NA 136 142 138 138  K 3.5 3.7 3.5 3.6  CL 102 103 103 104  CO2 24  --  25 25  GLUCOSE 114* 108* 104* 91  BUN 9 7 12 15   CREATININE 0.85 0.80 0.72 0.73  CALCIUM 9.3  --  8.9 8.8*  MG  --   --  2.0 2.0  PHOS  --   --  3.8 3.5   GFR: Estimated Creatinine Clearance: 106.9 mL/min (by C-G formula based on SCr of 0.73 mg/dL). Liver Function Tests: Recent Labs  Lab 03/04/20 2038 03/05/20 0456 03/07/20 0601  AST 20 18 13*  ALT 22 21 17   ALKPHOS 47 40 38  BILITOT 0.6 0.4 0.4  PROT 8.2* 6.9 6.3*  ALBUMIN 3.9 3.4* 3.1*   No results for input(s): LIPASE, AMYLASE in the last 168 hours. No results for input(s): AMMONIA in the last 168 hours. Coagulation Profile: Recent Labs  Lab 03/04/20 2038 03/05/20 0456  INR 1.0 1.1   Cardiac Enzymes: No results for input(s): CKTOTAL, CKMB, CKMBINDEX, TROPONINI in the last 168 hours. BNP (last 3 results) No results for input(s): PROBNP in the last 8760 hours. HbA1C: No results for input(s): HGBA1C in the last 72 hours. CBG: Recent Labs  Lab 03/04/20 2033  GLUCAP 142*   Lipid Profile: No  results for input(s): CHOL, HDL, LDLCALC, TRIG, CHOLHDL, LDLDIRECT in the last 72 hours. Thyroid Function Tests: Recent Labs    03/06/20 1837  TSH 0.523   Anemia Panel: Recent Labs    03/06/20 1837  VITAMINB12 363   Sepsis Labs: No results for input(s): PROCALCITON, LATICACIDVEN in the last 168 hours.  Recent Results (from the past 240 hour(s))  Respiratory Panel by RT PCR (Flu A&B, Covid) - Nasopharyngeal Swab     Status: None   Collection Time: 03/04/20 10:34 PM   Specimen: Nasopharyngeal Swab  Result Value Ref Range Status   SARS Coronavirus 2 by RT PCR NEGATIVE NEGATIVE Final    Comment: (NOTE) SARS-CoV-2 target nucleic acids are NOT DETECTED.  The SARS-CoV-2 RNA is generally detectable in upper respiratoy specimens during the acute phase of  infection. The lowest concentration of SARS-CoV-2 viral copies this assay can detect is 131 copies/mL. A negative result does not preclude SARS-Cov-2 infection and should not be used as the sole basis for treatment or other patient management decisions. A negative result may occur with  improper specimen collection/handling, submission of specimen other than nasopharyngeal swab, presence of viral mutation(s) within the areas targeted by this assay, and inadequate number of viral copies (<131 copies/mL). A negative result must be combined with clinical observations, patient history, and epidemiological information. The expected result is Negative.  Fact Sheet for Patients:  PinkCheek.be  Fact Sheet for Healthcare Providers:  GravelBags.it  This test is no t yet approved or cleared by the [place] FDA and  has been authorized for detection and/or diagnosis of SARS-CoV-2 by FDA under an Emergency Use Authorization (EUA). This EUA will remain  in effect (meaning this test can be used) for the duration of the COVID-19 declaration under Section 564(b)(1) of the Act, 21  U.S.C. section 360bbb-3(b)(1), unless the authorization is terminated or revoked sooner.     Influenza A by PCR NEGATIVE NEGATIVE Final   Influenza B by PCR NEGATIVE NEGATIVE Final    Comment: (NOTE) The Xpert Xpress SARS-CoV-2/FLU/RSV assay is intended as an aid in  the diagnosis of influenza from Nasopharyngeal swab specimens and  should not be used as a sole basis for treatment. Nasal washings and  aspirates are unacceptable for Xpert Xpress SARS-CoV-2/FLU/RSV  testing.  Fact Sheet for Patients: PinkCheek.be  Fact Sheet for Healthcare Providers: GravelBags.it  This test is not yet approved or cleared by the [place] FDA and  has been authorized for detection and/or diagnosis of SARS-CoV-2 by  FDA under an Emergency Use Authorization (EUA). This EUA will remain  in effect (meaning this test can be used) for the duration of the  Covid-19 declaration under Section 564(b)(1) of the Act, 21  U.S.C. section 360bbb-3(b)(1), unless the authorization is  terminated or revoked. Performed at [facility], [address] [place], [place] [address]      Radiology Studies: No results found.  Scheduled Meds: . aspirin EC  325 mg Oral Daily  . atorvastatin  80 mg Oral Daily  . clopidogrel  75 mg Oral Q breakfast  . lidocaine  1 patch Transdermal Q24H  . topiramate  25 mg Oral Daily   Continuous Infusions:   LOS: 4 days    Time spent: 25 mins.    Shawna Clamp, MD [facility]   If 7PM-7AM, please contact night-coverage

## 2020-03-10 ENCOUNTER — Encounter (HOSPITAL_COMMUNITY): Payer: Self-pay | Admitting: Physical Medicine & Rehabilitation

## 2020-03-10 ENCOUNTER — Inpatient Hospital Stay (HOSPITAL_COMMUNITY)
Admission: AD | Admit: 2020-03-10 | Discharge: 2020-03-24 | DRG: 057 | Disposition: A | Discharge status: Home Health | Payer: Medicaid Other | Source: Other Acute Inpatient Hospital | Attending: Physical Medicine & Rehabilitation | Admitting: Physical Medicine & Rehabilitation

## 2020-03-10 DIAGNOSIS — Z818 Family history of other mental and behavioral disorders: Secondary | ICD-10-CM | POA: Diagnosis not present

## 2020-03-10 DIAGNOSIS — I639 Cerebral infarction, unspecified: Secondary | ICD-10-CM | POA: Diagnosis present

## 2020-03-10 DIAGNOSIS — K5900 Constipation, unspecified: Secondary | ICD-10-CM | POA: Diagnosis present

## 2020-03-10 DIAGNOSIS — I161 Hypertensive emergency: Secondary | ICD-10-CM | POA: Diagnosis present

## 2020-03-10 DIAGNOSIS — R0989 Other specified symptoms and signs involving the circulatory and respiratory systems: Secondary | ICD-10-CM | POA: Diagnosis not present

## 2020-03-10 DIAGNOSIS — Z9114 Patient's other noncompliance with medication regimen: Secondary | ICD-10-CM | POA: Diagnosis not present

## 2020-03-10 DIAGNOSIS — Z8673 Personal history of transient ischemic attack (TIA), and cerebral infarction without residual deficits: Secondary | ICD-10-CM | POA: Diagnosis present

## 2020-03-10 DIAGNOSIS — G43909 Migraine, unspecified, not intractable, without status migrainosus: Secondary | ICD-10-CM | POA: Diagnosis not present

## 2020-03-10 DIAGNOSIS — F411 Generalized anxiety disorder: Secondary | ICD-10-CM | POA: Diagnosis not present

## 2020-03-10 DIAGNOSIS — G8194 Hemiplegia, unspecified affecting left nondominant side: Secondary | ICD-10-CM | POA: Diagnosis not present

## 2020-03-10 DIAGNOSIS — F4310 Post-traumatic stress disorder, unspecified: Secondary | ICD-10-CM | POA: Diagnosis present

## 2020-03-10 DIAGNOSIS — I10 Essential (primary) hypertension: Secondary | ICD-10-CM | POA: Diagnosis not present

## 2020-03-10 DIAGNOSIS — Z79899 Other long term (current) drug therapy: Secondary | ICD-10-CM

## 2020-03-10 DIAGNOSIS — F410 Panic disorder [episodic paroxysmal anxiety] without agoraphobia: Secondary | ICD-10-CM | POA: Diagnosis present

## 2020-03-10 DIAGNOSIS — I69392 Facial weakness following cerebral infarction: Secondary | ICD-10-CM | POA: Diagnosis not present

## 2020-03-10 DIAGNOSIS — I6381 Other cerebral infarction due to occlusion or stenosis of small artery: Secondary | ICD-10-CM | POA: Diagnosis present

## 2020-03-10 DIAGNOSIS — I69354 Hemiplegia and hemiparesis following cerebral infarction affecting left non-dominant side: Secondary | ICD-10-CM | POA: Diagnosis present

## 2020-03-10 DIAGNOSIS — F331 Major depressive disorder, recurrent, moderate: Secondary | ICD-10-CM | POA: Diagnosis present

## 2020-03-10 DIAGNOSIS — Q211 Atrial septal defect: Secondary | ICD-10-CM | POA: Diagnosis not present

## 2020-03-10 DIAGNOSIS — E785 Hyperlipidemia, unspecified: Secondary | ICD-10-CM | POA: Diagnosis present

## 2020-03-10 DIAGNOSIS — G500 Trigeminal neuralgia: Secondary | ICD-10-CM | POA: Diagnosis present

## 2020-03-10 LAB — TROPONIN I (HIGH SENSITIVITY): Troponin I (High Sensitivity): 4 ng/L (ref <18)

## 2020-03-10 MED ORDER — HYDROCHLOROTHIAZIDE 25 MG PO TABS: 25.0000 mg | ORAL_TABLET | Freq: Every day | ORAL | Status: DC

## 2020-03-10 MED ORDER — TRAZODONE HCL 50 MG PO TABS
50.0000 mg | ORAL_TABLET | Freq: Every evening | ORAL | Status: DC | PRN
  Administered 2020-03-10 – 2020-03-23 (×14): 50 mg via ORAL
  Filled 2020-03-10 (×14): qty 1

## 2020-03-10 MED ORDER — ASPIRIN 325 MG PO TBEC
325.0000 mg | DELAYED_RELEASE_TABLET | Freq: Every day | ORAL | 0 refills | Status: DC
Start: 2020-03-11 — End: 2020-03-23

## 2020-03-10 MED ORDER — TOPIRAMATE 25 MG PO TABS: 25.0000 mg | ORAL_TABLET | Freq: Every day | ORAL | 0 refills | Status: DC

## 2020-03-10 MED ORDER — ASPIRIN EC 325 MG PO TBEC
325.0000 mg | DELAYED_RELEASE_TABLET | Freq: Every day | ORAL | Status: DC
  Administered 2020-03-11 – 2020-03-24 (×14): 325 mg via ORAL
  Filled 2020-03-10 (×14): qty 1

## 2020-03-10 MED ORDER — ESCITALOPRAM OXALATE 10 MG PO TABS
20.0000 mg | ORAL_TABLET | Freq: Every day | ORAL | Status: DC
  Administered 2020-03-10 – 2020-03-24 (×15): 20 mg via ORAL
  Filled 2020-03-10 (×15): qty 2

## 2020-03-10 MED ORDER — ATORVASTATIN CALCIUM 80 MG PO TABS
80.0000 mg | ORAL_TABLET | Freq: Every day | ORAL | 0 refills | Status: DC
Start: 2020-03-11 — End: 2020-03-23

## 2020-03-10 MED ORDER — OXYCODONE HCL 5 MG PO TABS
5.0000 mg | ORAL_TABLET | ORAL | Status: DC | PRN
  Administered 2020-03-10 – 2020-03-23 (×27): 5 mg via ORAL
  Filled 2020-03-10 (×27): qty 1

## 2020-03-10 MED ORDER — LISINOPRIL 20 MG PO TABS: 20.0000 mg | ORAL_TABLET | Freq: Every day | ORAL | Status: DC

## 2020-03-10 MED ORDER — AMLODIPINE BESYLATE 5 MG PO TABS
5.0000 mg | ORAL_TABLET | Freq: Every day | ORAL | Status: DC
  Administered 2020-03-10 – 2020-03-24 (×15): 5 mg via ORAL
  Filled 2020-03-10 (×15): qty 1

## 2020-03-10 MED ORDER — ESCITALOPRAM OXALATE 10 MG PO TABS: 10.0000 mg | ORAL_TABLET | Freq: Every day | ORAL | Status: DC

## 2020-03-10 MED ORDER — ATORVASTATIN CALCIUM 80 MG PO TABS
80.0000 mg | ORAL_TABLET | Freq: Every day | ORAL | Status: DC
  Administered 2020-03-11 – 2020-03-24 (×14): 80 mg via ORAL
  Filled 2020-03-10 (×14): qty 1

## 2020-03-10 MED ORDER — NITROGLYCERIN 0.4 MG SL SUBL: 0.4000 mg | SUBLINGUAL_TABLET | SUBLINGUAL | Status: DC | PRN

## 2020-03-10 MED ORDER — TOPIRAMATE 25 MG PO TABS: 25.0000 mg | ORAL_TABLET | Freq: Every day | ORAL | Status: DC

## 2020-03-10 MED ORDER — NITROGLYCERIN 0.4 MG SL SUBL
SUBLINGUAL_TABLET | SUBLINGUAL | Status: AC
  Filled 2020-03-10: qty 1

## 2020-03-10 MED ORDER — TOPIRAMATE 25 MG PO TABS
50.0000 mg | ORAL_TABLET | Freq: Every day | ORAL | Status: DC
  Administered 2020-03-11 – 2020-03-24 (×14): 50 mg via ORAL
  Filled 2020-03-10 (×14): qty 2

## 2020-03-10 MED ORDER — LABETALOL HCL 5 MG/ML IV SOLN
5.0000 mg | Freq: Once | INTRAVENOUS | Status: AC
  Administered 2020-03-10: 5 mg via INTRAVENOUS
  Filled 2020-03-10: qty 4

## 2020-03-10 MED ORDER — CLOPIDOGREL BISULFATE 75 MG PO TABS
75.0000 mg | ORAL_TABLET | Freq: Every day | ORAL | Status: DC
  Administered 2020-03-11 – 2020-03-24 (×14): 75 mg via ORAL
  Filled 2020-03-10 (×14): qty 1

## 2020-03-10 MED ORDER — LIDOCAINE 5 % EX PTCH
1.0000 | MEDICATED_PATCH | CUTANEOUS | Status: DC
  Filled 2020-03-10 (×7): qty 1

## 2020-03-10 MED ORDER — CLOPIDOGREL BISULFATE 75 MG PO TABS
75.0000 mg | ORAL_TABLET | Freq: Every day | ORAL | 0 refills | Status: DC
Start: 2020-03-11 — End: 2020-03-23

## 2020-03-10 NOTE — H&P (Signed)
Physical Medicine and Rehabilitation Admission H&P    No chief complaint on file. : HPI: Joyce [name] is a 44 year old right-handed female with documented history of hypertension, depression and migraine headaches.  Per chart review patient lives with her spouse and children.  Independent prior to admission.  Two-level home bed and bath upstairs.  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  Presented to [facility] 03/04/2020 left-sided weakness.  She denied any chest pain shortness of breath nausea or vomiting.  Cranial CT scan showed no acute changes.  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  Carotid Dopplers with no ICA stenosis.  MRA of the head with no proximal intracranial vessel occlusion or significant stenosis.  Admission chemistries unremarkable except glucose 114, alcohol negative, hemoglobin 15.4, WBC 12,200.  Echocardiogram with ejection fraction of 60 to 65% no wall motion abnormalities.  Presently maintained on aspirin and Plavix for CVA prophylaxis x1 month followed by aspirin alone.  She continues on Topamax for history of headaches.  Tolerating a regular diet.  Therapy evaluations completed and patient was admitted for a comprehensive rehab program.  Pt reported noncompliant with HTN meds at home- also has hx of penic attacks- has had a lot of migraines ever since started Depo shots 3 years ago when daughter was born.  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  Also c/o painful tingling/numbness in L hand and L side of face  Review of Systems  Constitutional: Negative for chills and fever.  HENT: Negative for hearing loss.   Eyes: Negative for blurred vision, double vision and discharge.  Respiratory: Negative for shortness of breath.   Cardiovascular: Negative for chest pain, palpitations and leg swelling.  Gastrointestinal: Positive for constipation. Negative for  heartburn, nausea and vomiting.  Genitourinary: Negative for dysuria, flank pain and hematuria.  Musculoskeletal: Positive for myalgias.  Neurological: Positive for weakness and headaches.  Psychiatric/Behavioral: Positive for depression. The patient has insomnia.   All other systems reviewed and are negative.  Past Medical History:  Diagnosis Date  . Acute CVA (cerebrovascular accident) ([place]) 03/04/2020  . AMA (advanced maternal age) multigravida 35+ 11/12/2014  . Anemia   . Depression   . Hypertension   . Migraine   . Pregnant 11/12/2014  . PTSD (post-traumatic stress disorder) 05/01/2017  . Round ligament pain 11/12/2014  . Short of breath on exertion 11/12/2014  . Trichomonas infection    Past Surgical History:  Procedure Laterality Date  . CHOLECYSTECTOMY     Family History  Problem Relation Age of Onset  . Diabetes Mother   . Hypertension Mother   . Kidney disease Mother   . Asthma Mother   . Hyperlipidemia Mother   . Alzheimer's disease Father   . Diabetes Father   . Kidney disease Sister   . Asthma Son   . Diabetes Sister   . Asthma Son   . ADD / ADHD Son   . Heart murmur Son   . Asthma Son    Social History:  reports that she has never smoked. She has never used smokeless tobacco. She reports that she does not drink alcohol and does not use drugs. Allergies: Not on File Medications Prior to Admission  Medication Sig Dispense Refill  . amLODipine (NORVASC) 5 MG tablet TAKE 1 TABLET(5 MG) BY MOUTH DAILY (Patient taking differently: Take 5 mg by mouth daily. ) 30  tablet 1  . [START ON 03/11/2020] aspirin EC 325 MG EC tablet Take 1 tablet (325 mg total) by mouth daily. 30 tablet 0  . [START ON 03/11/2020] atorvastatin (LIPITOR) 80 MG tablet Take 1 tablet (80 mg total) by mouth daily. 30 tablet 0  . [START ON 03/11/2020] clopidogrel (PLAVIX) 75 MG tablet Take 1 tablet (75 mg total) by mouth daily with breakfast. 30 tablet 0  . escitalopram (LEXAPRO) 20 MG tablet Take 1  tablet (20 mg total) by mouth daily. 30 tablet 0  . hydrOXYzine (ATARAX/VISTARIL) 25 MG tablet Take 1 tablet (25 mg total) by mouth every 6 (six) hours as needed for anxiety. 20 tablet 1  . lisinopril-hydrochlorothiazide (PRINZIDE,ZESTORETIC) 20-25 MG tablet take one tablet by mouth once daily  3  . [START ON 03/11/2020] topiramate (TOPAMAX) 25 MG tablet Take 1 tablet (25 mg total) by mouth daily. 30 tablet 0  . traZODone (DESYREL) 50 MG tablet Take 1 tablet (50 mg total) by mouth at bedtime as needed for sleep. (Patient taking differently: Take 50 mg by mouth at bedtime. ) 30 tablet 0    Drug Regimen Review Drug regimen was reviewed and remains appropriate with no significant issues identified  Home:     Functional History:    Functional Status:  Mobility:          ADL:    Cognition: Cognition Orientation Level: Oriented X4    Physical Exam: Blood pressure (!) 139/59, pulse 89, temperature 98.2 F (36.8 C), resp. rate 16, height 5\' 6"  (1.676 m), weight 103.3 kg, SpO2 100 %. Physical Exam Vitals and nursing note reviewed.  Constitutional:      Appearance: She is obese.     Comments: Pt sitting up in bed; appropriate, just finished entire lunch except cake, interactive, no signs of aphasia, NAD  HENT:     Head: Normocephalic and atraumatic.     Comments: L facial droop at rest, but equalizes with smile; tongue midline- coated    Nose: Nose normal. No congestion.     Mouth/Throat:     Mouth: Mucous membranes are moist.     Pharynx: Oropharynx is clear. No oropharyngeal exudate.  Eyes:     General:        Right eye: No discharge.        Left eye: No discharge.     Extraocular Movements: Extraocular movements intact.     Comments: No nystagmus  Neck:     Comments: Tight in scalenes, upper traps and levators B/L Cardiovascular:     Comments: RRR- no JVD Pulmonary:     Comments: CTA B/L- no W/R/R- good air movement  Abdominal:     Comments: Soft, NT, ND, (+)BS     Musculoskeletal:     Cervical back: Rigidity and tenderness present.     Comments: RUE- 5/5 in deltoid, biceps, triceps, WE, grip and finger abd LUE- Deltoid/biceps 4-/5, Triceps/WE 4/5, grip 4/5, finger abd 4/5 RLE- HF, KE, DF and PF 5/5 LLE_ HF 3-/5, KE 2+/5, DF 3+/5, PF 4-/5   Skin:    Comments: No skin breakdown seen Has a large callus on R first MTP/bottom of foot Pimple forming on nose Biting inside mouth- bleeding a little on left in mouth    Neurological:     Comments: Patient is alert in no acute distress.  Speech is fluent.  Oriented x3 and follows commands. Decreased sensation to light touch on L side- LUE <LLE Also numb in V2/V3 on L  side of face- not V1  Psychiatric:     Comments: Slightly anxious     Results for orders placed or performed during the hospital encounter of 03/10/20 (from the past 48 hour(s))  Troponin I (High Sensitivity)     Status: None   Collection Time: 03/10/20  3:31 PM  Result Value Ref Range   Troponin I (High Sensitivity) 4 <18 ng/L    Comment: (NOTE) Elevated high sensitivity troponin I (hsTnI) values and significant  changes across serial measurements may suggest ACS but many other  chronic and acute conditions are known to elevate hsTnI results.  Refer to the "Links" section for chest pain algorithms and additional  guidance. Performed at [facility], [place] [address], [place], [place] [address]    No results found.     Medical Problem List and Plan: 1.  Left side weakness secondary to acute lateral right thalamic infarction  -patient may shower  -ELOS/Goals: 10-14 days mod I 2.  Antithrombotics: -DVT/anticoagulation: SCDs  -antiplatelet therapy: Aspirin 305 mg daily and Plavix 75 mg daily x1 month then aspirin alone 3. Pain Management: Lidoderm patch, oxycodone as needed- has migraines'hx of panic attacks 4. Mood: Provide emotional support  -antipsychotic agents: N/A 5. Neuropsych: This patient is capable of making  decisions on her own behalf. 6. Skin/Wound Care: Routine skin checks 7. Fluids/Electrolytes/Nutrition: Routine in and outs with follow-up chemistries 8.  Migraine headaches.  Topamax 25 mg daily- could use lidocaine patches on neck for HA's.  9.  Hyperlipidemia.  Lipitor   Lavon Paganini Anguilli, PA-C 03/10/2020    I have personally performed a face to face diagnostic evaluation of this patient and formulated the key components of the plan.  Additionally, I have personally reviewed laboratory data, imaging studies, as well as relevant notes and concur with the physician assistant's documentation above.   The patient's status has not changed from the original H&P.  Any changes in documentation from the acute care chart have been noted above.    Addendum: Pt 1 hour after I left room, her BP went up to 220/120- having migraine, also L sided chest pain, also felt more SOB once unable to reach nursing intially, per pt.  EKG done- inverted ST changes- nonspecific- per Dr Ranell Patrick- Gave IV labetolol Also did some myofascial release on her neck to help with HA since didn't want to give more meds to confuse issue.  Got a lot better. Felt better  Courtney Heys, MD 03/10/2020

## 2020-03-10 NOTE — Progress Notes (Signed)
Courtney Heys, MD  Physician  Physical Medicine and Rehabilitation  PMR Pre-admission     Signed  Date of Service:  03/06/2020  4:27 PM      Related encounter: ED to Hosp-Admission (Discharged) from 03/04/2020 in [facility] all [x] Manual[x] Template[x] Copied  Added by: [x] Cristina Gong, RN[x] Lovorn, [place], MD[x] Michel Santee, PT  [] Hover for details PMR Admission Coordinator Pre-Admission Assessment   Patient: Joyce [name] is an 44 y.o., female MRN: [id number] DOB: 01/28/1976 Height: 5\' 6"  (167.6 cm) Weight: 97.9 kg   Insurance Information HMO:     PPO:      PCP:      IPA:      80/20:      OTHER:  PRIMARY: uninsured      Policy#:       Subscriber:  CM Name:       Phone#:      Fax#:  Pre-Cert#:       Employer:  Benefits:  Phone #:      Name:  Eff. Date:      Deduct:       Out of Pocket Max:       Life Max:  CIR:       SNF:  Outpatient:      Co-Pay:  Home Health:       Co-Pay:  DME:      Co-Pay:  Providers:  SECONDARY:       Policy#:      Phone#:    Development worker, community:       Phone#:    The Engineer, petroleum" for patients in Inpatient Rehabilitation Facilities with attached "Privacy Act [place] Records" was provided and verbally reviewed with: N/A   Emergency Contact Information         Contact Information     Name Relation Home Work Mobile    [place] Daughter [phone]   [phone]    [place] Significant other [phone]   [phone]         Current Medical History  Patient Admitting Diagnosis: CVA   History of Present Illness: Joyce [name] is a 44 y.o. female with medical history significant for hypertension, depression and migraine who presents to the emergency department due to left-sided numbness of upper and lower extremities which started around 12 noon.  Patient states that she took a nap with the hope that symptoms will go away,  but on waking up, she had difficulty in walking and she states that she fell 3 times without hurting herself, so she drove herself to the ED, she denies chest pain, shortness of breath, headache, nausea, vomiting or abdominal pain.   In the ED she had CT head which was negative for stroke. Tele- neurologist was consulted,  admitted for a stroke work-up.  Carotid duplex: no hemodynamically significant stenosis.  Echocardiogram unremarkable . 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 No proximal intracranial vessel occlusion or significant stenosis. PT recommended CIR.   Complete NIHSS TOTAL: 5   Patient's medical record from [facility] has been reviewed by the rehabilitation admission coordinator and physician.   Past Medical History      Past Medical History:  Diagnosis Date  . Acute CVA (cerebrovascular accident) ([place]) 03/04/2020  . AMA (advanced maternal age) multigravida 35+ 11/12/2014  . Anemia    . Depression    . Hypertension    . Migraine    .  Pregnant 11/12/2014  . PTSD (post-traumatic stress disorder) 05/01/2017  . Round ligament pain 11/12/2014  . Short of breath on exertion 11/12/2014  . Trichomonas infection        Family History   family history includes ADD / ADHD in her son; Alzheimer's disease in her father; Asthma in her mother, son, son, and son; Diabetes in her father, mother, and sister; Heart murmur in her son; Hyperlipidemia in her mother; Hypertension in her mother; Kidney disease in her mother and sister.   Prior Rehab/Hospitalizations Has the patient had prior rehab or hospitalizations prior to admission? No   Has the patient had major surgery during 100 days prior to admission? No               Current Medications   Current Facility-Administered Medications:  .  aspirin EC tablet 325 mg, 325 mg, Oral, Daily, [place], Kofi, MD, 325 mg at 03/09/20 1040 .  atorvastatin (LIPITOR) tablet 80 mg, 80 mg, Oral, Daily, Shawna Clamp, MD, 80 mg  at 03/09/20 1040 .  clopidogrel (PLAVIX) tablet 75 mg, 75 mg, Oral, Q breakfast, [place], Kofi, MD, 75 mg at 03/09/20 1041 .  lidocaine (LIDODERM) 5 % 1 patch, 1 patch, Transdermal, Q24H, Adefeso, Oladapo, DO, 1 patch at 03/05/20 0739 .  oxyCODONE (Oxy IR/ROXICODONE) immediate release tablet 5 mg, 5 mg, Oral, Q4H PRN, Adefeso, Oladapo, DO, 5 mg at 03/09/20 2238 .  topiramate (TOPAMAX) tablet 25 mg, 25 mg, Oral, Daily, [place], Kofi, MD, 25 mg at 03/09/20 1041 .  traZODone (DESYREL) tablet 50 mg, 50 mg, Oral, QHS PRN, Shawna Clamp, MD, 50 mg at 03/08/20 2210   Patients Current Diet:     Diet Order                      Diet Heart Room service appropriate? Yes; Fluid consistency: Thin  Diet effective now                      Precautions / Restrictions Precautions Precautions: Fall Precaution Comments: left side hemiparesis Restrictions Weight Bearing Restrictions: No    Has the patient had 2 or more falls or a fall with injury in the past year? 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 Independent   Indoor Mobility: Did the patient need assistance with walking from room to room (with or without device)? Independent   Stairs: Did the patient need assistance with internal or external stairs (with or without device)? Independent   Functional Cognition: Did the patient need help planning regular tasks such as shopping or remembering to take medications? Independent   Home Assistive Devices / Equipment Home Assistive Devices/Equipment: None Home Equipment: None   Prior Device Use: Indicate devices/aids used by the patient prior to current illness, exacerbation or injury? None of the above   Current Functional Level Cognition   Overall Cognitive Status: Within Functional Limits for tasks  assessed Orientation Level: Oriented X4    Extremity Assessment (includes Sensation/Coordination)   Upper Extremity Assessment: LUE deficits/detail LUE Deficits / Details: Patient able to complete 50% of shoulder ranges initially although with increased use she experiences muscle fatigue and is only able to complete 25% or less. Full A/ROM elbow flexion/extension, wrist flexion/extension, and composite fingers flexion/extension. Slow and labored motor movements for  all LUE AROM. Strength is as follows: shoulder flexion and abduction: 3-/5, IR/er 3+/5, elbow flexion: 4-/5, elbow extension: 3/5, forearm supination/pronation: 3+/5, wrist flexion/extension: 3+/5, decreased gross grasp. LUE Sensation: decreased light touch LUE Coordination: decreased gross motor, decreased fine motor  Lower Extremity Assessment: Defer to PT evaluation RLE Deficits / Details: grossly 5/5 RLE Sensation: WNL RLE Coordination: WNL LLE Deficits / Details: grossly 3/5 LLE Sensation: decreased light touch, decreased proprioception LLE Coordination: decreased fine motor     ADLs   Overall ADL's : Needs assistance/impaired Eating/Feeding: Set up, Sitting Grooming: Wash/dry hands, Wash/dry face, Oral care, Applying deodorant, Minimal assistance, Sitting Upper Body Bathing: Minimal assistance, Sitting Lower Body Bathing: Moderate assistance, Sitting/lateral leans Upper Body Dressing : Moderate assistance, Sitting Lower Body Dressing: Moderate assistance, Sitting/lateral leans Toilet Transfer Details (indicate cue type and reason): Not tested this date General ADL Comments: Slow labored movements with all activities. Requires increased assistance with LUE due to muscle weakness.     Mobility   Overal bed mobility: Needs Assistance Bed Mobility: Supine to Sit, Sit to Supine Supine to sit: Supervision, HOB elevated Sit to supine: Min assist General bed mobility comments: assist for LLE back into bed     Transfers    Overall transfer level: Needs assistance Equipment used: 1 person hand held assist, Rolling walker (2 wheeled) Transfers: Sit to/from Stand, Stand Pivot Transfers Sit to Stand: Min assist, Mod assist Stand pivot transfers: Min assist, Mod assist General transfer comment: Patient uses bed and PT assist to tranfer to standing and reaches for RW following standing, patient unsteady upon initial standing which improves with use of RW. Heavy UE use with standing, limited weightbearing on LLE due to apprehension     Ambulation / Gait / Stairs / Wheelchair Mobility   Ambulation/Gait Ambulation/Gait assistance: Min assist, Mod assist Gait Distance (Feet): 60 Feet Assistive device: Rolling walker (2 wheeled) Gait Pattern/deviations: Decreased step length - right, Decreased step length - left, Decreased stride length, Decreased dorsiflexion - left, Step-to pattern General Gait Details: very unsteady with poor tolerance for weightbearing on LLE due to weakness and impaired sensation, required use of RW demonstrating slow labored cadence. Patient with c/o hand symptoms increasing with gripping on RW and with fatigue requiring standing rest breaks Gait velocity: decreased     Posture / Balance Dynamic Sitting Balance Sitting balance - Comments: good seated EOB Balance Overall balance assessment: Needs assistance Sitting-balance support: Feet supported, No upper extremity supported Sitting balance-Leahy Scale: Good Sitting balance - Comments: good seated EOB Postural control: Posterior lean Standing balance support: During functional activity Standing balance-Leahy Scale: Good Standing balance comment: good/fair with using RW     Special needs/care consideration n/a    Previous Home Environment (from acute therapy documentation) Living Arrangements: Spouse/significant other, Children Available Help at Discharge: Family, Available 24 hours/day Type of Home: House Home Layout: Two level, Bed/bath  upstairs Alternate Level Stairs-Rails: Left Alternate Level Stairs-Number of Steps: 10-15 (1 flight) Home Access: Stairs to enter Entrance Stairs-Rails: None Entrance Stairs-Number of Steps: 4 Bathroom Accessibility: Yes Home Care Services: No   Discharge Living Setting Plans for Discharge Living Setting: Patient's home Type of Home at Discharge: House Discharge Home Layout: Bed/bath upstairs, Two level Alternate Level Stairs-Rails: Left Alternate Level Stairs-Number of Steps: full flight Discharge Home Access: Stairs to enter Entrance Stairs-Rails: None Entrance Stairs-Number of Steps: 3-4 Discharge Bathroom Shower/Tub: Tub/shower unit Discharge Bathroom Toilet:  (uses a BSC??) Discharge Bathroom Accessibility: Yes How Accessible: Accessible via walker  Does the patient have any problems obtaining your medications?: No   Social/Family/Support Systems Patient Roles: Parent Anticipated Caregiver: Kirby Crigler (daughter)  Anticipated Caregiver's Contact Information: [phone] Ability/Limitations of Caregiver: works days Caregiver Availability: Evenings only Discharge Plan Discussed with Primary Caregiver: No (discussed with patient) Is Caregiver In Agreement with Plan?: Yes Does Caregiver/Family have Issues with Lodging/Transportation while Pt is in Rehab?: No   Goals Patient/Family Goal for Rehab: PT/OT mod I, SLP n/a Expected length of stay: 7-10 days Pt/Family Agrees to Admission and willing to participate: Yes Program Orientation Provided & Reviewed with Pt/Caregiver Including Roles  & Responsibilities: Yes   Decrease burden of Care through IP rehab admission: n/a   Possible need for SNF placement upon discharge: Not anticipated   Patient Condition: I have reviewed medical records from [facility], spoken with CM, and patient. I discussed via phone for inpatient rehabilitation assessment.  Patient will benefit from ongoing PT and OT, can actively participate in 3 hours of  therapy a day 5 days of the week, and can make measurable gains during the admission.  Patient will also benefit from the coordinated team approach during an Inpatient Acute Rehabilitation admission.  The patient will receive intensive therapy as well as Rehabilitation physician, nursing, social worker, and care management interventions.  Due to safety, disease management, medication administration, pain management and patient education the patient requires 24 hour a day rehabilitation nursing.  The patient is currently min-mod A with mobility and basic ADLs.  Discharge setting and therapy post discharge at home with home health is anticipated.  Patient has agreed to participate in the Acute Inpatient Rehabilitation Program and will admit today.   Preadmission Screen Completed By: Shann Medal, PT, DPT with updates by   Cleatrice Burke, 03/10/2020 10:03 AM ______________________________________________________________________   Discussed status with Dr. Dagoberto Ligas on  03/10/2020 at 22 and received approval for admission today.   Admission Coordinator: Shann Medal, PT, DPT;   Julious Payer Audelia Acton, RN, time 1005 Date 03/10/2020    Assessment/Plan: Diagnosis: 1. Does the need for close, 24 hr/day Medical supervision in concert with the patient's rehab needs make it unreasonable for this patient to be served in a less intensive setting? Yes 2. Co-Morbidities requiring supervision/potential complications: HTN, depression, migraines, L hemiparesis, R lateral thalamus infarct 3. Due to bladder management, bowel management, safety, skin/wound care, disease management, medication administration and patient education, does the patient require 24 hr/day rehab nursing? Yes 4. Does the patient require coordinated care of a physician, rehab nurse, PT, OT, and SLP to address physical and functional deficits in the context of the above medical diagnosis(es)? Yes Addressing deficits in the following  areas: balance, endurance, locomotion, strength, transferring, bathing, dressing, feeding, grooming, toileting, cognition, speech and swallowing 5. Can the patient actively participate in an intensive therapy program of at least 3 hrs of therapy 5 days a week? Yes 6. The potential for patient to make measurable gains while on inpatient rehab is good 7. Anticipated functional outcomes upon discharge from inpatient rehab: modified independent PT, modified independent OT, modified independent SLP 8. Estimated rehab length of stay to reach the above functional goals is: 7-10 days 9. Anticipated discharge destination: Home 10. Overall Rehab/Functional Prognosis: good     MD Signature:          Revision History                          Note Details  Author Lovorn, Jinny Blossom,  MD File Time 03/10/2020 10:13 AM  Author Type Physician Status Signed  Last Editor Courtney Heys, MD Service Physical Medicine and [place] # [id number] Admit Date 03/10/2020

## 2020-03-10 NOTE — Progress Notes (Signed)
Physical Therapy Treatment Patient Details Name: Joyce [name] MRN: [id number] DOB: 04-26-1976 Today's Date: 03/10/2020    History of Present Illness Joyce [name] is a 44 y.o. female with medical history significant for hypertension, depression and migraine who presents to the emergency department due to left-sided numbness of upper and lower extremities which started around 12 noon.  Patient states that she took a nap with the hope that symptoms will go away, but on waking up, she had difficulty in walking and she states that she fell 3 times without hurting herself, so she decided to go to the ED for further evaluation and management, patient drove herself to the ED, she denies chest pain, shortness of breath, headache, nausea, vomiting or abdominal pain.    PT Comments    Patient demonstrates improvement for left ankle dorsiflexion motor control and can complete left ankle exercises through full ROM while seated at bedside, continues to having difficulty extending left knee and unable to fully extend due to weakness, demonstrates slow labored cadence with decreased active left ankle dorsiflexion when taking steps requiring verbal cues to attempt heel to toe stepping with fair carryover, no loss of balance and mostly limited for gait training due to c/o fatigue.  Patient c/o severe numbness and tingling left side of body requiring frequent rest breaks while completing exercises.  Patient tolerated sitting up in chair after therapy.  Patient will benefit from continued physical therapy in hospital and recommended venue below to increase strength, balance, endurance for safe ADLs and gait.    Follow Up Recommendations  CIR     Equipment Recommendations  Rolling walker with 5" wheels    Recommendations for Other Services       Precautions / Restrictions Precautions Precautions: Fall Precaution Comments: left side hemiparesis Restrictions Weight Bearing Restrictions: No     Mobility  Bed Mobility Overal bed mobility: Needs Assistance Bed Mobility: Supine to Sit     Supine to sit: Supervision;HOB elevated     General bed mobility comments: increased time, labored movement  Transfers Overall transfer level: Needs assistance Equipment used: Rolling walker (2 wheeled) Transfers: Sit to/from [facility] Sit to Stand: Min guard;Min assist Stand pivot transfers: Min assist;Min guard       General transfer comment: slow labored movement requiring multiple attempts to complete sit to stand  Ambulation/Gait Ambulation/Gait assistance: Min assist;Mod assist Gait Distance (Feet): 65 Feet Assistive device: Rolling walker (2 wheeled) Gait Pattern/deviations: Decreased step length - right;Decreased step length - left;Decreased stride length;Decreased dorsiflexion - left;Step-to pattern Gait velocity: decreased   General Gait Details: slow labored cadence with short steps, decreased left ankle dorsiflexion, much reliance on weightbearing through arms secondary decreased proprioception LLE, limited secondary to c/o fatigue   Stairs             Wheelchair Mobility    Modified Rankin (Stroke Patients Only)       Balance Overall balance assessment: Needs assistance Sitting-balance support: Feet supported;No upper extremity supported Sitting balance-Leahy Scale: Poor Sitting balance - Comments: seated at EOB   Standing balance support: During functional activity;Bilateral upper extremity supported Standing balance-Leahy Scale: Fair Standing balance comment: fair/good using RW                            Cognition Arousal/Alertness: Awake/alert Behavior During Therapy: WFL for tasks assessed/performed Overall Cognitive Status: Within Functional Limits for tasks assessed  Exercises General Exercises - Lower Extremity Long Arc Quad: AROM;Both;10  reps;Seated;Strengthening Hip Flexion/Marching: Seated;AROM;Strengthening;Both;10 reps Toe Raises: Seated;AROM;Strengthening;Both;15 reps Heel Raises: Seated;AROM;Strengthening;Both;15 reps    General Comments        Pertinent Vitals/Pain Pain Assessment: Faces Faces Pain Scale: Hurts even more Pain Location: left sided numbness Pain Descriptors / Indicators: Numbness;Pins and needles Pain Intervention(s): Limited activity within patient's tolerance;Monitored during session    Home Living                      Prior Function            PT Goals (current goals can now be found in the care plan section) Acute Rehab PT Goals Patient Stated Goal: return home after rehab PT Goal Formulation: With patient/family Time For Goal Achievement: 03/19/20 Potential to Achieve Goals: Good Progress towards PT goals: Progressing toward goals    Frequency    Min 5X/week      PT Plan Current plan remains appropriate    Co-evaluation              AM-PAC PT "6 Clicks" Mobility   Outcome Measure  Help needed turning from your back to your side while in a flat bed without using bedrails?: None Help needed moving from lying on your back to sitting on the side of a flat bed without using bedrails?: A Little Help needed moving to and from a bed to a chair (including a wheelchair)?: A Little Help needed standing up from a chair using your arms (e.g., wheelchair or bedside chair)?: A Little Help needed to walk in hospital room?: A Little Help needed climbing 3-5 steps with a railing? : A Lot 6 Click Score: 18    End of Session   Activity Tolerance: Patient tolerated treatment well;Patient limited by fatigue Patient left: in chair;with call bell/phone within reach Nurse Communication: Mobility status PT Visit Diagnosis: Unsteadiness on feet (R26.81);Other abnormalities of gait and mobility (R26.89);Muscle weakness (generalized) (M62.81);Hemiplegia and  hemiparesis Hemiplegia - Right/Left: Left Hemiplegia - dominant/non-dominant: Non-dominant Hemiplegia - caused by: Cerebral infarction     Time: 0923-0953 PT Time Calculation (min) (ACUTE ONLY): 30 min  Charges:  $Gait Training: 8-22 mins $Therapeutic Exercise: 8-22 mins                     10:18 AM, 03/10/20 Lonell Grandchild, MPT Physical Therapist with [facility] [phone] office [phone] mobile phone

## 2020-03-10 NOTE — Progress Notes (Signed)
Inpatient Rehabilitation Admissions Coordinator  I have inpt rehab bed/Cir bed to admit patient to today. I spoke with her by phone and she is in agreement to admit. I spoke with [place], SW as well as Joslyn Devon , RN to arrange. She will be admitted to [facility] with Dr. Dagoberto Ligas admitting today. I will call [facility] for transport.  Danne Baxter, RN, MSN Rehab Admissions Coordinator [phone] 03/10/2020 10:02 AM

## 2020-03-10 NOTE — Progress Notes (Signed)
Pt c/o pain and swelling to new iv site, pt sensitive to touch in area, will remove and request iv team to retry.

## 2020-03-10 NOTE — Progress Notes (Signed)
Pt called and asked for something for headache, while getting medication tech called nurse to room, pt was not responding verbally but could gesture, breathing was labored and unable to tell me what was going on. Called PA to bedside. New orders received, medications administered with good response, chest pain has resolved, heart rate around 108 at this time, pt able verbalize after medications administered that has panic attacks. Will continue plan of care, all needs met.

## 2020-03-10 NOTE — Progress Notes (Signed)
Pt puts call light on and states "I need this IV covered" Walk into pt's room and pt is up with rolling walker with pt family member. This nurse instructs pt that while pt is in inpatient rehab a staff member should be up with pt. Pt states that she is frustrated, because she has asked multiple times to get to shower. Family member present states "if she goes through this again, I will come in here and show my ass". This nurse expresses understanding with this frustration. Pt taken to bathroom and showered with this nurse for pt satisfaction. Charge nurse notified.

## 2020-03-10 NOTE — Progress Notes (Signed)
Admit to unit, reviewed medications,reha schedule and plan of care. Margarito Liner

## 2020-03-10 NOTE — Discharge Summary (Signed)
Physician Discharge Summary  Joyce [name] ATF:[id number] DOB: 1975-10-30 DOA: 03/04/2020  PCP: Patient, No Pcp Per  Admit date: 03/04/2020   Discharge date: 03/10/2020  Admitted From: Home.  Disposition:  Inpatient Rehab at [facility].  Recommendations for Outpatient Follow-up:  1. Follow up with PCP in 1-2 weeks. 2. Please obtain BMP/CBC in one week. 3. Advised to follow up Neurology as scheduled. 4. Advised to take Dual antiplatelet therapy for one month followed by aspirin 325 mg daily. 5. Advised to take topamax as advised.  Home Health: None. Equipment/Devices: None  Discharge Condition: Good CODE STATUS:Full code Diet recommendation: Heart Healthy   Brief Summary / Hospital course: This 44 years old female with PMH significant for hypertension, depression, migraine presents in the ED with left-sided numbness involving upper and lower extremities.  She has worsening symptoms, with associated weakness , She fell 3 times afterwards at home.  In the ED She had CT head which was negative for stroke. Tele- neurologist was consulted,  admitted for stroke work-up.  Carotid duplex: No hemodynamically significant stenosis.  Echocardiogram unremarkable . 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 No proximal intracranial vessel occlusion or significant stenosis. Neurology consulted recommended dual antiplatelet therapy for one month followed by aspirin only therapy. Continue Lipitor.  PT recommended CIR. Patient was also started on topamax for migraine and post thalamic stroke pain.[facility].   She was managed for below problems.  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 CT of head  without contrast showed normal CT. Teleneurologist was consulted and recommended further stroke work-up Patient  admitted to telemetry unit  Bilateral carotid ultrasound : No hemodynamically significant stenosis. Echocardiogram : LVEF 60 to 65%.  No regional wall motion abnormalities. unremarkable. MRI of brain without contrast:  Acute subcentimeter infarct of the lateral right thalamus.  Continue aspirin325 mg po daily. Continue fall precautions and neuro checks. Hemoglobin A1c 5.6, lipid profile WNL LDL 96 PT recommended CIR. Patient passed bedside swallow eval by nursing Neurology recommended dual antiplatelet agents for 1 month followed by aspirin 325 mg alone. Patient continues to have significant post thalamic CVA pain on the left side and numbness. Started on Topamax. She reports improvement in pain. Continue Lipitor 80 mg po daily , LDL goal below 70.  Leukocytosis possibly reactive >> Resolved. WBC 12.2,no sign of acute infectious process at this time. Continue to monitor WBC with morning labs  Essential hypertension (controlled) Permissive hypertension will be allowed at this time.   Discharge Instructions  Discharge Instructions    Call MD for:  difficulty breathing, headache or visual disturbances   Complete by: As directed    Call MD for:  persistant dizziness or light-headedness   Complete by: As directed    Call MD for:  persistant nausea and vomiting   Complete by: As directed    Diet - low sodium heart healthy   Complete by: As directed    Diet Carb Modified   Complete by: As directed    Discharge instructions   Complete by: As directed    Advised to follow up PCP in one  week. Advised to follow up Neurology as scheduled. Advised to take Dual antiplatelet therapy for one month followed by aspirin  325 mg daily. Advised to take topamax as advised.   Increase activity slowly   Complete by: As directed      Allergies as of 03/10/2020   No Known  Allergies     Medication List    STOP taking these medications   dexamethasone 4 MG tablet Commonly known as: DECADRON   ibuprofen 200 MG tablet Commonly known as: ADVIL   medroxyPROGESTERone 150 MG/ML injection Commonly known as: DEPO-PROVERA   PrePLUS 27-1 MG Tabs   promethazine 25 MG tablet Commonly known as: PHENERGAN   SUMAtriptan 50 MG tablet Commonly known as: IMITREX     TAKE these medications   amLODipine 5 MG tablet Commonly known as: NORVASC TAKE 1 TABLET(5 MG) BY MOUTH DAILY What changed: See the new instructions.   aspirin 325 MG EC tablet Take 1 tablet (325 mg total) by mouth daily. Start taking on: March 11, 2020   atorvastatin 80 MG tablet Commonly known as: LIPITOR Take 1 tablet (80 mg total) by mouth daily. Start taking on: March 11, 2020   clopidogrel 75 MG tablet Commonly known as: PLAVIX Take 1 tablet (75 mg total) by mouth daily with breakfast. Start taking on: March 11, 2020   escitalopram 20 MG tablet Commonly known as: LEXAPRO Take 1 tablet (20 mg total) by mouth daily.   hydrOXYzine 25 MG tablet Commonly known as: ATARAX/VISTARIL Take 1 tablet (25 mg total) by mouth every 6 (six) hours as needed for anxiety.   lisinopril-hydrochlorothiazide 20-25 MG tablet Commonly known as: ZESTORETIC take one tablet by mouth once daily   topiramate 25 MG tablet Commonly known as: TOPAMAX Take 1 tablet (25 mg total) by mouth daily. Start taking on: March 11, 2020   traZODone 50 MG tablet Commonly known as: DESYREL Take 1 tablet (50 mg total) by mouth at bedtime as needed for sleep. What changed: when to take this       Follow-up Information    [facility] Follow up in 1 week(s).   Contact information: [place], [place] [phone]             No Known Allergies  Consultations: Neurology  Procedures/Studies: MR ANGIO HEAD WO CONTRAST  Result Date:  03/05/2020 CLINICAL DATA:  Left sided numbness EXAM: MRI HEAD WITHOUT CONTRAST MRA HEAD WITHOUT CONTRAST TECHNIQUE: Multiplanar, multiecho pulse sequences of the brain and surrounding structures were obtained without intravenous contrast. Angiographic images of the head were obtained using MRA technique without contrast. COMPARISON:  None. FINDINGS: MRI HEAD Brain: There is an 8 mm focus of restricted diffusion involving the lateral right thalamus. There is no acute infarction or intracranial hemorrhage. There is no intracranial mass, mass effect, or edema. There is no hydrocephalus or extra-axial fluid collection. Ventricles and sulci are normal in size and configuration. Patchy T2 hyperintensity in the supratentorial white matter is nonspecific may reflect minor chronic microvascular ischemic changes. Vascular: Major vessel flow voids at the skull base are preserved. Skull and upper cervical spine: Normal marrow signal is preserved. Sinuses/Orbits: Paranasal sinuses are aerated. Orbits are unremarkable. Other: Sella is unremarkable.  Mastoid air cells are clear. MRA HEAD Intracranial internal carotid arteries are patent. Middle and anterior cerebral arteries are patent. Intracranial vertebral arteries, basilar artery, posterior cerebral arteries are patent. There is no significant stenosis or aneurysm. IMPRESSION: Acute subcentimeter infarct of the lateral right thalamus. No proximal intracranial vessel occlusion or significant stenosis. Electronically Signed   By: Addison Lank.D.  On: 03/05/2020 12:40   MR BRAIN WO CONTRAST  Result Date: 03/05/2020 CLINICAL DATA:  Left sided numbness EXAM: MRI HEAD WITHOUT CONTRAST MRA HEAD WITHOUT CONTRAST TECHNIQUE: Multiplanar, multiecho pulse sequences of the brain and surrounding structures were obtained without intravenous contrast. Angiographic images of the head were obtained using MRA technique without contrast. COMPARISON:  None. FINDINGS: MRI HEAD Brain: There  is an 8 mm focus of restricted diffusion involving the lateral right thalamus. There is no acute infarction or intracranial hemorrhage. There is no intracranial mass, mass effect, or edema. There is no hydrocephalus or extra-axial fluid collection. Ventricles and sulci are normal in size and configuration. Patchy T2 hyperintensity in the supratentorial white matter is nonspecific may reflect minor chronic microvascular ischemic changes. Vascular: Major vessel flow voids at the skull base are preserved. Skull and upper cervical spine: Normal marrow signal is preserved. Sinuses/Orbits: Paranasal sinuses are aerated. Orbits are unremarkable. Other: Sella is unremarkable.  Mastoid air cells are clear. MRA HEAD Intracranial internal carotid arteries are patent. Middle and anterior cerebral arteries are patent. Intracranial vertebral arteries, basilar artery, posterior cerebral arteries are patent. There is no significant stenosis or aneurysm. IMPRESSION: Acute subcentimeter infarct of the lateral right thalamus. No proximal intracranial vessel occlusion or significant stenosis. Electronically Signed   By: Macy Mis M.D.   On: 03/05/2020 12:40   US Carotid Bilateral  Result Date: 03/05/2020 CLINICAL DATA:  44 year old female with a history of stroke EXAM: BILATERAL CAROTID DUPLEX ULTRASOUND TECHNIQUE: Pearline Cables scale imaging, color Doppler and duplex ultrasound were performed of bilateral carotid and vertebral arteries in the neck. COMPARISON:  None. FINDINGS: Criteria: Quantification of carotid stenosis is based on velocity parameters that correlate the residual internal carotid diameter with NASCET-based stenosis levels, using the diameter of the distal internal carotid lumen as the denominator for stenosis measurement. The following velocity measurements were obtained: RIGHT ICA:  Systolic 72 cm/sec, Diastolic 37 cm/sec CCA:  93 cm/sec SYSTOLIC ICA/CCA RATIO:  0.8 ECA:  78 cm/sec LEFT ICA:  Systolic 65 cm/sec,  Diastolic 28 cm/sec CCA:  712 cm/sec SYSTOLIC ICA/CCA RATIO:  0.6 ECA:  75 cm/sec Right Brachial SBP: Not acquired Left Brachial SBP: Not acquired RIGHT CAROTID ARTERY: No significant calcified disease of the right common carotid artery. Intermediate waveform maintained. Homogeneous plaque without significant calcifications at the right carotid bifurcation. Low resistance waveform of the right ICA. No significant tortuosity. RIGHT VERTEBRAL ARTERY: Antegrade flow with low resistance waveform. LEFT CAROTID ARTERY: No significant calcified disease of the left common carotid artery. Intermediate waveform maintained. Homogeneous plaque at the left carotid bifurcation without significant calcifications. Low resistance waveform of the left ICA. LEFT VERTEBRAL ARTERY:  Antegrade flow with low resistance waveform. IMPRESSION: Color duplex indicates minimal homogeneous plaque, with no hemodynamically significant stenosis by duplex criteria in the extracranial cerebrovascular circulation. Signed, Dulcy Fanny. Dellia Nims, RPVI [facility] Electronically Signed   By: Corrie Mckusick D.O.   On: 03/05/2020 13:27   ECHOCARDIOGRAM COMPLETE  Result Date: 03/05/2020    ECHOCARDIOGRAM REPORT   Patient Name:   RENESMAE DONAHEY Date of Exam: 03/05/2020 Medical Rec #:  [id number]          Height:       66.0 in Accession #:    [id number]         Weight:       215.8 lb Date of Birth:  [birth year]          BSA:  2.066 m Patient Age:    53 years           BP:           124/70 mmHg Patient Gender: F                  HR:           86 bpm. Exam Location:  [facility] Procedure: 2D Echo Indications:    Stroke 434.91 / I163.9  History:        Patient has no prior history of Echocardiogram examinations.                 Stroke; Risk Factors:Hypertension. 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  Sonographer:    Leavy Cella RDCS (AE) Referring Phys: [id number] OLADAPO ADEFESO  IMPRESSIONS  1. Left ventricular ejection fraction, by estimation, is 60 to 65%. The left ventricle has normal function. The left ventricle has no regional wall motion abnormalities. There is mild left ventricular hypertrophy. Left ventricular diastolic parameters are indeterminate.  2. Right ventricular systolic function is normal. The right ventricular size is normal.  3. The mitral valve is normal in structure. No evidence of mitral valve regurgitation. No evidence of mitral stenosis.  4. The aortic valve is tricuspid. Aortic valve regurgitation is not visualized. No aortic stenosis is present.  5. The inferior vena cava is normal in size with greater than 50% respiratory variability, suggesting right atrial pressure of 3 mmHg. FINDINGS  Left Ventricle: Left ventricular ejection fraction, by estimation, is 60 to 65%. The left ventricle has normal function. The left ventricle has no regional wall motion abnormalities. The left ventricular internal cavity size was normal in size. There is  mild left ventricular hypertrophy. Left ventricular diastolic parameters are indeterminate. Right Ventricle: The right ventricular size is normal. No increase in right ventricular wall thickness. Right ventricular systolic function is normal. Left Atrium: Left atrial size was normal in size. Right Atrium: Right atrial size was normal in size. Pericardium: There is no evidence of pericardial effusion. Mitral Valve: The mitral valve is normal in structure. No evidence of mitral valve regurgitation. No evidence of mitral valve stenosis. Tricuspid Valve: The tricuspid valve is normal in structure. Tricuspid valve regurgitation is not demonstrated. No evidence of tricuspid stenosis. Aortic Valve: The aortic valve is tricuspid. Aortic valve regurgitation is not visualized. No aortic stenosis is present. Aortic valve mean gradient measures 6.1 mmHg. Aortic valve peak gradient measures 11.0 mmHg. Aortic valve area, by VTI measures 1.70   cm. Pulmonic Valve: The pulmonic valve was not well visualized. Pulmonic valve regurgitation is not visualized. No evidence of pulmonic stenosis. Aorta: The aortic root is normal in size and structure. Pulmonary Artery: Indeterminant PASP, inadequate TR jet. Venous: The inferior vena cava is normal in size with greater than 50% respiratory variability, suggesting right atrial pressure of 3 mmHg. IAS/Shunts: No atrial level shunt detected by color flow Doppler.  LEFT VENTRICLE PLAX 2D LVIDd:         3.81 cm  Diastology LVIDs:         2.69 cm  LV e' medial:    7.07 cm/s LV PW:         1.10 cm  LV E/e' medial:  9.8 LV IVS:        1.07 cm  LV e' lateral:   8.27 cm/s LVOT diam:  2.00 cm  LV E/e' lateral: 8.4 LV SV:         53 LV SV Index:   26 LVOT Area:     3.14 cm  RIGHT VENTRICLE RV S prime:     12.70 cm/s TAPSE (M-mode): 1.9 cm LEFT ATRIUM           Index       RIGHT ATRIUM          Index LA diam:      3.00 cm 1.45 cm/m  RA Area:     9.53 cm LA Vol (A2C): 24.1 ml 11.67 ml/m RA Volume:   20.00 ml 9.68 ml/m LA Vol (A4C): 34.0 ml 16.46 ml/m  AORTIC VALVE AV Area (Vmax):    2.12 cm AV Area (Vmean):   1.90 cm AV Area (VTI):     1.70 cm AV Vmax:           166.14 cm/s AV Vmean:          118.985 cm/s AV VTI:            0.314 m AV Peak Grad:      11.0 mmHg AV Mean Grad:      6.1 mmHg LVOT Vmax:         112.13 cm/s LVOT Vmean:        71.874 cm/s LVOT VTI:          0.170 m LVOT/AV VTI ratio: 0.54  AORTA Ao Root diam: 2.40 cm MITRAL VALVE MV Area (PHT): 4.52 cm    SHUNTS MV Decel Time: 168 msec    Systemic VTI:  0.17 m MV E velocity: 69.50 cm/s  Systemic Diam: 2.00 cm MV A velocity: 71.70 cm/s MV E/A ratio:  0.97 Carlyle Dolly MD Electronically signed by Carlyle Dolly MD Signature Date/Time: 03/05/2020/10:47:07 AM    Final    CT HEAD CODE STROKE WO CONTRAST  Result Date: 03/04/2020 CLINICAL DATA:  Code stroke. Left-sided numbness beginning 9 hours ago. EXAM: CT HEAD WITHOUT CONTRAST TECHNIQUE: Contiguous  axial images were obtained from the base of the skull through the vertex without intravenous contrast. COMPARISON:  None. FINDINGS: Brain: Normal appearance without evidence malformation, atrophy, old or acute infarction, mass lesion, hemorrhage, hydrocephalus or extra-axial collection. Vascular: No abnormal vascular finding. Skull: Normal Sinuses/Orbits: Clear/normal Other: None ASPECTS ([place] Stroke Program Early CT Score) - Ganglionic level infarction (caudate, lentiform nuclei, internal capsule, insula, M1-M3 cortex): 7 - Supraganglionic infarction (M4-M6 cortex): 3 Total score (0-10 with 10 being normal): 10 IMPRESSION: 1. Normal head CT. 2. ASPECTS is 10. 3. These results were called by telephone at the time of interpretation on 03/04/2020 at 8:56 pm to provider Fredia Sorrow , who verbally acknowledged these results. Electronically Signed   By: Nelson Chimes M.D.   On: 03/04/2020 20:56    (Echo, Carotid, MRI, CT Head, )    Subjective: Patient was seen and examined at bed side, She reports feeling better but still has numbness on left hand fingers and still feel weak to walk.  Discharge Exam: Vitals:   03/09/20 2127 03/10/20 0649  BP: 134/90 134/68  Pulse: 90 81  Resp:  18  Temp: 99 F (37.2 C) 98.1 F (36.7 C)  SpO2: 100% 100%   Vitals:   03/09/20 0623 03/09/20 1401 03/09/20 2127 03/10/20 0649  BP: 136/79 134/66 134/90 134/68  Pulse: 79 88 90 81  Resp:  18  18  Temp: 98.6 F (37 C) 98.2 F (36.8 C) 99  F (37.2 C) 98.1 F (36.7 C)  TempSrc: Oral Oral Oral   SpO2: 100% 100% 100% 100%  Weight:      Height:        General: Pt is alert, awake, not in acute distress Cardiovascular: RRR, S1/S2 +, no rubs, no gallops Respiratory: CTA bilaterally, no wheezing, no rhonchi Abdominal: Soft, NT, ND, bowel sounds + Extremities:  Left sided weakness,  no edema, no cyanosis    The results of significant diagnostics from this hospitalization (including imaging, microbiology,  ancillary and laboratory) are listed below for reference.     Microbiology: Recent Results (from the past 240 hour(s))  Respiratory Panel by RT PCR (Flu A&B, Covid) - Nasopharyngeal Swab     Status: None   Collection Time: 03/04/20 10:34 PM   Specimen: Nasopharyngeal Swab  Result Value Ref Range Status   SARS Coronavirus 2 by RT PCR NEGATIVE NEGATIVE Final    Comment: (NOTE) SARS-CoV-2 target nucleic acids are NOT DETECTED.  The SARS-CoV-2 RNA is generally detectable in upper respiratoy specimens during the acute phase of infection. The lowest concentration of SARS-CoV-2 viral copies this assay can detect is 131 copies/mL. A negative result does not preclude SARS-Cov-2 infection and should not be used as the sole basis for treatment or other patient management decisions. A negative result may occur with  improper specimen collection/handling, submission of specimen other than nasopharyngeal swab, presence of viral mutation(s) within the areas targeted by this assay, and inadequate number of viral copies (<131 copies/mL). A negative result must be combined with clinical observations, patient history, and epidemiological information. The expected result is Negative.  Fact Sheet for Patients:  PinkCheek.be  Fact Sheet for Healthcare Providers:  GravelBags.it  This test is no t yet approved or cleared by the [place] FDA and  has been authorized for detection and/or diagnosis of SARS-CoV-2 by FDA under an Emergency Use Authorization (EUA). This EUA will remain  in effect (meaning this test can be used) for the duration of the COVID-19 declaration under Section 564(b)(1) of the Act, 21 U.S.C. section 360bbb-3(b)(1), unless the authorization is terminated or revoked sooner.     Influenza A by PCR NEGATIVE NEGATIVE Final   Influenza B by PCR NEGATIVE NEGATIVE Final    Comment: (NOTE) The Xpert Xpress SARS-CoV-2/FLU/RSV  assay is intended as an aid in  the diagnosis of influenza from Nasopharyngeal swab specimens and  should not be used as a sole basis for treatment. Nasal washings and  aspirates are unacceptable for Xpert Xpress SARS-CoV-2/FLU/RSV  testing.  Fact Sheet for Patients: PinkCheek.be  Fact Sheet for Healthcare Providers: GravelBags.it  This test is not yet approved or cleared by the [place] FDA and  has been authorized for detection and/or diagnosis of SARS-CoV-2 by  FDA under an Emergency Use Authorization (EUA). This EUA will remain  in effect (meaning this test can be used) for the duration of the  Covid-19 declaration under Section 564(b)(1) of the Act, 21  U.S.C. section 360bbb-3(b)(1), unless the authorization is  terminated or revoked. Performed at [facility], [address] [place], [place] [address]      Labs: BNP (last 3 results) No results for input(s): BNP in the last 8760 hours. Basic Metabolic Panel: Recent Labs  Lab 03/04/20 2038 03/04/20 2042 03/05/20 0456 03/07/20 0601  NA 136 142 138 138  K 3.5 3.7 3.5 3.6  CL 102 103 103 104  CO2 24  --  25 25  GLUCOSE 114* 108*  104* 91  BUN 9 7 12 15   CREATININE 0.85 0.80 0.72 0.73  CALCIUM 9.3  --  8.9 8.8*  MG  --   --  2.0 2.0  PHOS  --   --  3.8 3.5   Liver Function Tests: Recent Labs  Lab 03/04/20 2038 03/05/20 0456 03/07/20 0601  AST 20 18 13*  ALT 22 21 17   ALKPHOS 47 40 38  BILITOT 0.6 0.4 0.4  PROT 8.2* 6.9 6.3*  ALBUMIN 3.9 3.4* 3.1*   No results for input(s): LIPASE, AMYLASE in the last 168 hours. No results for input(s): AMMONIA in the last 168 hours. CBC: Recent Labs  Lab 03/04/20 2038 03/04/20 2042 03/05/20 0456 03/07/20 0601  WBC 12.2*  --  9.3 9.2  NEUTROABS 8.3*  --   --   --   HGB 15.4* 16.3* 13.9 13.6  HCT 46.2* 48.0* 43.1 42.6  MCV 80.9  --  81.0 82.7  PLT 301  --  271 279   Cardiac Enzymes: No results for  input(s): CKTOTAL, CKMB, CKMBINDEX, TROPONINI in the last 168 hours. BNP: Invalid input(s): POCBNP CBG: Recent Labs  Lab 03/04/20 2033  GLUCAP 142*   D-Dimer No results for input(s): DDIMER in the last 72 hours. Hgb A1c No results for input(s): HGBA1C in the last 72 hours. Lipid Profile No results for input(s): CHOL, HDL, LDLCALC, TRIG, CHOLHDL, LDLDIRECT in the last 72 hours. Thyroid function studies No results for input(s): TSH, T4TOTAL, T3FREE, THYROIDAB in the last 72 hours.  Invalid input(s): FREET3 Anemia work up No results for input(s): VITAMINB12, FOLATE, FERRITIN, TIBC, IRON, RETICCTPCT in the last 72 hours. Urinalysis    Component Value Date/Time   COLORURINE YELLOW 07/15/2018 2200   APPEARANCEUR HAZY (A) 07/15/2018 2200   APPEARANCEUR Cloudy (A) 01/27/2016 1625   LABSPEC 1.008 07/15/2018 2200   PHURINE 6.0 07/15/2018 2200   GLUCOSEU NEGATIVE 07/15/2018 2200   HGBUR MODERATE (A) 07/15/2018 2200   BILIRUBINUR NEGATIVE 07/15/2018 2200   BILIRUBINUR Negative 01/27/2016 1625   KETONESUR NEGATIVE 07/15/2018 2200   PROTEINUR 100 (A) 07/15/2018 2200   UROBILINOGEN 0.2 04/07/2015 0005   NITRITE NEGATIVE 07/15/2018 2200   LEUKOCYTESUR SMALL (A) 07/15/2018 2200   Sepsis Labs Invalid input(s): PROCALCITONIN,  WBC,  LACTICIDVEN Microbiology Recent Results (from the past 240 hour(s))  Respiratory Panel by RT PCR (Flu A&B, Covid) - Nasopharyngeal Swab     Status: None   Collection Time: 03/04/20 10:34 PM   Specimen: Nasopharyngeal Swab  Result Value Ref Range Status   SARS Coronavirus 2 by RT PCR NEGATIVE NEGATIVE Final    Comment: (NOTE) SARS-CoV-2 target nucleic acids are NOT DETECTED.  The SARS-CoV-2 RNA is generally detectable in upper respiratoy specimens during the acute phase of infection. The lowest concentration of SARS-CoV-2 viral copies this assay can detect is 131 copies/mL. A negative result does not preclude SARS-Cov-2 infection and should not be used  as the sole basis for treatment or other patient management decisions. A negative result may occur with  improper specimen collection/handling, submission of specimen other than nasopharyngeal swab, presence of viral mutation(s) within the areas targeted by this assay, and inadequate number of viral copies (<131 copies/mL). A negative result must be combined with clinical observations, patient history, and epidemiological information. The expected result is Negative.  Fact Sheet for Patients:  PinkCheek.be  Fact Sheet for Healthcare Providers:  GravelBags.it  This test is no t yet approved or cleared by the [place] and  has been authorized for detection and/or diagnosis of SARS-CoV-2 by FDA under an Emergency Use Authorization (EUA). This EUA will remain  in effect (meaning this test can be used) for the duration of the COVID-19 declaration under Section 564(b)(1) of the Act, 21 U.S.C. section 360bbb-3(b)(1), unless the authorization is terminated or revoked sooner.     Influenza A by PCR NEGATIVE NEGATIVE Final   Influenza B by PCR NEGATIVE NEGATIVE Final    Comment: (NOTE) The Xpert Xpress SARS-CoV-2/FLU/RSV assay is intended as an aid in  the diagnosis of influenza from Nasopharyngeal swab specimens and  should not be used as a sole basis for treatment. Nasal washings and  aspirates are unacceptable for Xpert Xpress SARS-CoV-2/FLU/RSV  testing.  Fact Sheet for Patients: PinkCheek.be  Fact Sheet for Healthcare Providers: GravelBags.it  This test is not yet approved or cleared by the [place] FDA and  has been authorized for detection and/or diagnosis of SARS-CoV-2 by  FDA under an Emergency Use Authorization (EUA). This EUA will remain  in effect (meaning this test can be used) for the duration of the  Covid-19 declaration under Section  564(b)(1) of the Act, 21  U.S.C. section 360bbb-3(b)(1), unless the authorization is  terminated or revoked. Performed at [facility], [address], [place], [place] [address]      Time coordinating discharge: Over 30 minutes  SIGNED:   Shawna Clamp, MD  [facility] 03/10/2020, 10:32 AM Pager   If 7PM-7AM, please contact night-coverage [email or web address]

## 2020-03-10 NOTE — Discharge Instructions (Signed)
Advised to follow up PCP in one  week. Advised to follow up Neurology as scheduled. Advised to take Dual antiplatelet therapy for one month followed by aspirin 325 mg daily. Advised to take topamax as advised.

## 2020-03-10 NOTE — TOC Transition Note (Signed)
Transition of Care [facility]) - CM/SW Discharge Note   Patient Details  Name: Joyce [name] MRN: [id number] Date of Birth: 09/16/1975  Transition of Care [facility]) CM/SW Contact:  Salome Arnt, LCSW Phone Number: 03/10/2020, 11:18 AM   Clinical Narrative:  Pt d/c today to CIR. Transport arranged via [place].      Final next level of care: IP Rehab Facility Barriers to Discharge: Barriers Resolved   Patient Goals and CMS Choice        Discharge Placement                  Name of family member notified: pt only Patient and family notified of of transfer: 03/10/20  Discharge Plan and Services                                     Social Determinants of Health (SDOH) Interventions     Readmission Risk Interventions No flowsheet data found.

## 2020-03-10 NOTE — Progress Notes (Signed)
Inpatient Rehabilitation Medication Review by a Pharmacist  A complete drug regimen review was completed for this patient to identify any potential clinically significant medication issues.  Clinically significant medication issues were identified:  yes   Type of Medication Issue Identified Description of Issue Urgent (address now) Non-Urgent (address on AM team rounds) Plan Plan Accepted by Provider? (Yes / No / Pending AM Rounds)  Drug Interaction(s) (clinically significant)       Duplicate Therapy       Allergy       No Medication Administration End Date       Incorrect Dose       Additional Drug Therapy Needed       Other  Patient refusing lidocaine patch since 10/8 Non-urgent Day shift pharmacist already sent provider Epic secure chat Pending AM rounds    Name of provider notified for urgent issues identified: [place]  Provider Method of Notification: Epic secure message   For non-urgent medication issues to be resolved on team rounds tomorrow morning a [facility] Secure Chat Handoff was sent to: n/a - already in pharmacist handoff from day shift pharmacist   Pharmacist comments:   Time spent performing this drug regimen review (minutes):  [place], PharmD, BCPS Please check AMION for all [place] contact numbers Clinical Pharmacist 03/10/2020 7:27 PM

## 2020-03-10 NOTE — Progress Notes (Signed)
Spoke with patient regarding her concerns with delay in receiving pain medication, in a timely manner reassurance provided, states she understand, po liquids provided

## 2020-03-10 NOTE — Progress Notes (Signed)
Chaplain engaged in initial visit with [name].  During visit, [name] shared that she is ready to get home and that she has two weeks left of rehab which she hopes can be brought down to one week.  She also shared what brought her into the hospital.  [name] verbalized that she has high blood pressure and suffers from migraines and that she had not been taking her medicine regularly.  She noted that stress may play a big part in her recent stroke as well.  [name] and her husband share approximately eight children.  She also expressed that she works a lot. Chaplain celebrated [name] and also asked her what she plans to do outside of the hospital to take care of herself.  [name] noted that she was going take more time off from working and spend more time doing things with her children.  She wants to find more time to relax.  We also discussed her making sure to take her medicine.  [name] stated how hard it has been to be in the hospital for a longer span of time and how she does not want to come back.  [name] also desires to get closer to God again.   Chaplain offered the ministries of listening and prayer. Chaplain is available to provide support as needed.

## 2020-03-10 NOTE — Progress Notes (Signed)
Pt arrived to unit via [facility]AR, pt is alert and able to make needs known, educated about rehab, will continue plan of care.

## 2020-03-11 ENCOUNTER — Inpatient Hospital Stay (HOSPITAL_COMMUNITY): Payer: Medicaid Other

## 2020-03-11 ENCOUNTER — Inpatient Hospital Stay (HOSPITAL_COMMUNITY): Payer: Medicaid Other | Admitting: Occupational Therapy

## 2020-03-11 ENCOUNTER — Inpatient Hospital Stay (HOSPITAL_BASED_OUTPATIENT_CLINIC_OR_DEPARTMENT_OTHER): Payer: Medicaid Other

## 2020-03-11 ENCOUNTER — Other Ambulatory Visit: Payer: Self-pay

## 2020-03-11 DIAGNOSIS — Q211 Atrial septal defect: Secondary | ICD-10-CM | POA: Diagnosis not present

## 2020-03-11 LAB — COMPREHENSIVE METABOLIC PANEL
ALT: 18 U/L (ref 0–44)
AST: 16 U/L (ref 15–41)
Albumin: 2.9 g/dL — ABNORMAL LOW (ref 3.5–5.0)
Alkaline Phosphatase: 44 U/L (ref 38–126)
Anion gap: 8 (ref 5–15)
BUN: 13 mg/dL (ref 6–20)
CO2: 22 mmol/L (ref 22–32)
Calcium: 9 mg/dL (ref 8.9–10.3)
Chloride: 111 mmol/L (ref 98–111)
Creatinine, Ser: 0.85 mg/dL (ref 0.44–1.00)
GFR, Estimated: >60 mL/min (ref >60)
Glucose, Bld: 92 mg/dL (ref 70–99)
Potassium: 3.6 mmol/L (ref 3.5–5.1)
Sodium: 141 mmol/L (ref 135–145)
Total Bilirubin: 0.4 mg/dL (ref 0.3–1.2)
Total Protein: 6.3 g/dL — ABNORMAL LOW (ref 6.5–8.1)

## 2020-03-11 LAB — ANTIPHOSPHOLIPID SYNDROME EVAL, BLD
Anticardiolipin IgA: <9 APL U/mL (ref 0–11)
Anticardiolipin IgG: <9 GPL U/mL (ref 0–14)
Anticardiolipin IgM: <9 MPL U/mL (ref 0–12)
DRVVT: 38.7 s (ref 0.0–47.0)
PTT Lupus Anticoagulant: 36.4 s (ref 0.0–51.9)
Phosphatydalserine, IgA: 1 APS IgA (ref 0–20)
Phosphatydalserine, IgG: <10 Units (ref 0–30)
Phosphatydalserine, IgM: <22 MPS IgM (ref <22)

## 2020-03-11 LAB — CBC WITH DIFFERENTIAL/PLATELET
Abs Immature Granulocytes: 0.04 10*3/uL (ref 0.00–0.07)
Basophils Absolute: 0.1 10*3/uL (ref 0.0–0.1)
Basophils Relative: 1 %
Eosinophils Absolute: 0.2 10*3/uL (ref 0.0–0.5)
Eosinophils Relative: 2 %
HCT: 42 % (ref 36.0–46.0)
Hemoglobin: 13.5 g/dL (ref 12.0–15.0)
Immature Granulocytes: 0 %
Lymphocytes Relative: 18 %
Lymphs Abs: 1.7 10*3/uL (ref 0.7–4.0)
MCH: 25.9 pg — ABNORMAL LOW (ref 26.0–34.0)
MCHC: 32.1 g/dL (ref 30.0–36.0)
MCV: 80.6 fL (ref 80.0–100.0)
Monocytes Absolute: 0.4 10*3/uL (ref 0.1–1.0)
Monocytes Relative: 5 %
Neutro Abs: 7 10*3/uL (ref 1.7–7.7)
Neutrophils Relative %: 74 %
Platelets: 286 10*3/uL (ref 150–400)
RBC: 5.21 MIL/uL — ABNORMAL HIGH (ref 3.87–5.11)
RDW: 14.5 % (ref 11.5–15.5)
WBC: 9.5 10*3/uL (ref 4.0–10.5)
nRBC: 0 % (ref 0.0–0.2)

## 2020-03-11 MED ORDER — PROSOURCE PLUS PO LIQD
30.0000 mL | Freq: Two times a day (BID) | ORAL | Status: DC
  Administered 2020-03-11 – 2020-03-23 (×25): 30 mL via ORAL
  Filled 2020-03-11 (×25): qty 30

## 2020-03-11 NOTE — Progress Notes (Signed)
Inpatient Rehabilitation  Patient information reviewed and entered into eRehab system by Dillinger Aston M. Mayfield Schoene, M.A., CCC/SLP, PPS Coordinator.  Information including medical coding, functional ability and quality indicators will be reviewed and updated through discharge.    

## 2020-03-11 NOTE — Progress Notes (Signed)
Pt has participated in therapy today. Reports being tired with increased pain to left lower abdomen. Pt refused lunch, saved lunch per pt request.  PRN pain medication administered with good results. Pt attending therapy at this time. Will continue plan of care.

## 2020-03-11 NOTE — Progress Notes (Signed)
Patient Details  Name: Joyce [name] MRN: [id number] Date of Birth: 26-Oct-1975  Today's Date: 03/11/2020  Hospital Problems: Principal Problem:   Left hemiparesis [facility]) Active Problems:   Right thalamic infarction [facility])  Past Medical History:  Past Medical History:  Diagnosis Date  . Acute CVA (cerebrovascular accident) ([place]) 03/04/2020  . AMA (advanced maternal age) multigravida 35+ 11/12/2014  . Anemia   . Depression   . Hypertension   . Migraine   . Pregnant 11/12/2014  . PTSD (post-traumatic stress disorder) 05/01/2017  . Round ligament pain 11/12/2014  . Short of breath on exertion 11/12/2014  . Trichomonas infection    Past Surgical History:  Past Surgical History:  Procedure Laterality Date  . CHOLECYSTECTOMY     Social History:  reports that she has never smoked. She has never used smokeless tobacco. She reports that she does not drink alcohol and does not use drugs.  Family / Support Systems Marital Status: Single Patient Roles: Partner, Parent Spouse/Significant OtherAndres Ege (s/o): [phone] Children: Blended family. Has 4 biological children ( 4,3, 21, and 58 y.o. is her nephew she adopted). Together she and her partner Andres Ege have 8 children. Other Supports: None reported Anticipated Caregiver: Andres Ege and his mother. Ability/Limitations of Caregiver: Andres Ege works but willing to provide assistance. Caregiver Availability: 24/7 Family Dynamics: She and partner have 8 children, and get assistance from his mother.  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 Pt states her supervisor is exploring short term/long term disability. Legal History/Current Legal Issues:  Denies Guardian/Conservator: N/A   Abuse/Neglect Abuse/Neglect Assessment Can Be Completed: Yes Physical Abuse: Denies Verbal Abuse: Denies Sexual Abuse: Denies Exploitation of patient/patient's resources: Denies Self-Neglect: Denies  Emotional Status Pt's affect, behavior and adjustment status: Pt in good spirits at time of visit Recent Psychosocial Issues: Denies Psychiatric History: Admits to hx of depression/anxiety. States she takes medication. Substance Abuse History: Denies  Patient / Family Perceptions, Expectations & Goals Pt/Family understanding of illness & functional limitations: Pt and s.o have general understanding of care needs. Premorbid pt/family roles/activities: Independent Anticipated changes in roles/activities/participation: Assistance with ADLs/IADLs Pt/family expectations/goals: "improve breathing, and get feelinf back on left side."  [employer]: None Premorbid Home Care/DME Agencies: None Transportation available at discharge: s/o Resource referrals recommended: Neuropsychology  Discharge Planning Living Arrangements: Spouse/significant other, Children, Other relatives Support Systems: Spouse/significant other, Children, Other relatives Type of Residence: Private residence Insurance Resources: Teacher, adult education Resources: Employment, Secondary school teacher Screen Referred: Yes Living Expenses: Own Money Management: Patient, Spouse Does the patient have any problems obtaining your medications?: No Home Management: Both managed housekeeping and meal prep Care Coordinator Barriers to Discharge: Other (comments) Care Coordinator Barriers to Discharge Comments: Pt uninsured Care Coordinator Anticipated Follow Up Needs: HH/OP  Clinical Impression SW met with pt in room to introduce self, explain role, and discuss discharge process. Pt has no HCPOA. DME: 3in1 [facility]. SW discussed with pt potential DME needs: w/c, RW. SW also  explained DME charity, and MATCH Rx assistance program.   SW called pt s/o [name] [phone]) to introduce self, explain role, and discuss discharge process. SW reiterated above with regard to charity DME, MATCH Rx, and possible DME needed. SW explained there will be follow-up after  team conference.   Luiscarlos Kaczmarczyk A Lenn Volker 03/11/2020, 3:14 PM

## 2020-03-11 NOTE — Progress Notes (Signed)
[place] PHYSICAL MEDICINE & REHABILITATION PROGRESS NOTE   Subjective/Complaints: Complains of some chest pain while exerting herself with therapy this morning. It did not radiate into her arms. It felt like a sharp pain. BP improved after 1x dose of IV Labetalol yesterday afternoon  ROS: +chest pain with activity, no radiation into arm.  Objective:   No results found. Recent Labs    03/11/20 0659  WBC 9.5  HGB 13.5  HCT 42.0  PLT 286   Recent Labs    03/11/20 0659  NA 141  K 3.6  CL 111  CO2 22  GLUCOSE 92  BUN 13  CREATININE 0.85  CALCIUM 9.0    Intake/Output Summary (Last 24 hours) at 03/11/2020 0908 Last data filed at 03/11/2020 0700 Gross per 24 hour  Intake 560 ml  Output --  Net 560 ml        Physical Exam: Vital Signs Blood pressure 133/82, pulse 82, temperature 98.4 F (36.9 C), resp. rate 18, height 5\' 6"  (1.676 m), weight 103.3 kg, SpO2 100 %.  General: Alert and oriented x 3, No apparent distress HEENT: L facial droop at rest, but equalizes with smile; tongue midline- coated, poor dentition Neck: Supple without JVD or lymphadenopathy Heart: Reg rate and rhythm. No murmurs rubs or gallops Chest: CTA bilaterally without wheezes, rales, or rhonchi; no distress Abdomen: Soft, non-tender, non-distended, bowel sounds positive. Musculoskeletal:     Cervical back: Rigidity and tenderness present.     Comments: RUE- 5/5 in deltoid, biceps, triceps, WE, grip and finger abd LUE- Deltoid/biceps 4-/5, Triceps/WE 4/5, grip 4/5, finger abd 4/5 RLE- HF, KE, DF and PF 5/5 LLE_ HF 3-/5, KE 2+/5, DF 3+/5, PF 4-/5   Skin:    Comments: No skin breakdown seen Has a large callus on R first MTP/bottom of foot Pimple forming on nose Biting inside mouth- bleeding a little on left in mouth Neurological:     Comments: Patient is alert in no acute distress.  Speech is fluent.  Oriented x3 and follows commands. Decreased sensation to light touch on L side- LUE  <LLE Also numb in V2/V3 on L side of face- not V1  Psychiatric:     Comments: Slightly anxious     Assessment/Plan: 1. Functional deficits secondary to left hemiparesis which require 3+ hours per day of interdisciplinary therapy in a comprehensive inpatient rehab setting.  Physiatrist is providing close team supervision and 24 hour management of active medical problems listed below.  Physiatrist and rehab team continue to assess barriers to discharge/monitor patient progress toward functional and medical goals  Care Tool:  Bathing              Bathing assist       Upper Body Dressing/Undressing Upper body dressing        Upper body assist      Lower Body Dressing/Undressing Lower body dressing            Lower body assist       Toileting Toileting    Toileting assist       Transfers Chair/bed transfer  Transfers assist           Locomotion Ambulation   Ambulation assist              Walk 10 feet activity   Assist           Walk 50 feet activity   Assist           Walk  150 feet activity   Assist           Walk 10 feet on uneven surface  activity   Assist           Wheelchair     Assist               Wheelchair 50 feet with 2 turns activity    Assist            Wheelchair 150 feet activity     Assist          Blood pressure 133/82, pulse 82, temperature 98.4 F (36.9 C), resp. rate 18, height 5\' 6"  (1.676 m), weight 103.3 kg, SpO2 100 %.    Medical Problem List and Plan: 1.  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  Antithrombotics: -DVT/anticoagulation: SCDs             -antiplatelet therapy: Aspirin 305 mg daily and Plavix 75 mg daily x1 month then aspirin alone 3. Pain Management: Lidoderm patch, oxycodone as needed- has migraines'hx of panic  attacks 4. Mood: Provide emotional support             -antipsychotic agents: N/A 5. Neuropsych: This patient is capable of making decisions on her own behalf. Would benefit from neuropsych eval.  6. Skin/Wound Care: Routine skin checks 7. Fluids/Electrolytes/Nutrition: Routine in and outs with follow-up chemistries 8.  Migraine headaches.  Topamax 25 mg daily- could use lidocaine patches on neck for HA's.  9.  Hyperlipidemia.  Lipitor 10. Hypertensive emergency: BP went up to 220s/1teens yesterday afternoon- improved with 1x dose of IV labetalol. Normotensive this morning. Her home amlodipine dose was restarted.  11. Chest pain on 10/12: EKG was obtained and showed nonspecific ST elevation. Nitroglycerin and oxygen were administered. Episode resolved. She does have some chest pressure while working with therapy this morning; it is nontender to palpation. Continue to monitor closely.  12. Low albumin: supplement initiated 10/13.   LOS: 1 days A FACE TO FACE EVALUATION WAS PERFORMED  Joyce [name] 03/11/2020, 9:08 AM

## 2020-03-11 NOTE — Evaluation (Signed)
Physical Therapy Assessment and Plan  Patient Details  Name: Joyce [name] MRN: [id number] Date of Birth: 04-29-1976  PT Diagnosis: Difficulty walking, Hemiparesis non-dominant and Muscle weakness Rehab Potential: Good ELOS: 11-14 days   Today's Date: 03/11/2020 PT Individual Time: 6378-5885 and 1431-1533 PT Individual Time Calculation (min): 62 min And 62 min  Hospital Problem: Principal Problem:   Left hemiparesis ([place]) Active Problems:   Right thalamic infarction [facility])   Past Medical History:  Past Medical History:  Diagnosis Date  . Acute CVA (cerebrovascular accident) ([place]) 03/04/2020  . AMA (advanced maternal age) multigravida 35+ 11/12/2014  . Anemia   . Depression   . Hypertension   . Migraine   . Pregnant 11/12/2014  . PTSD (post-traumatic stress disorder) 05/01/2017  . Round ligament pain 11/12/2014  . Short of breath on exertion 11/12/2014  . Trichomonas infection    Past Surgical History:  Past Surgical History:  Procedure Laterality Date  . CHOLECYSTECTOMY      Assessment & Plan Clinical Impression: Patient is a 44 year old right-handed female with documented history of hypertension, depression and migraine headaches.  Per chart review patient lives with her spouse and children.  Independent prior to admission.  Two-level home bed and bath upstairs.  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  Presented to [facility] 03/04/2020 left-sided weakness.  She denied any chest pain shortness of breath nausea or vomiting.  Cranial CT scan showed no acute changes.  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  Carotid Dopplers with no ICA stenosis.  MRA of the head with no proximal intracranial vessel occlusion or significant stenosis.  Admission chemistries unremarkable except glucose 114, alcohol negative, hemoglobin 15.4, WBC 12,200.  Echocardiogram with ejection fraction of 60 to 65% no wall motion abnormalities.  Presently maintained on  aspirin and Plavix for CVA prophylaxis x1 month followed by aspirin alone.  She continues on Topamax for history of headaches.  Tolerating a regular diet.   Patient transferred to CIR on 03/10/2020 .   Patient currently requires mod with mobility secondary to muscle weakness, decreased cardiorespiratoy endurance, unbalanced muscle activation, decreased coordination and decreased motor planning and decreased sitting balance, decreased standing balance, hemiplegia and decreased balance strategies.  Prior to hospitalization, patient was independent  with mobility and lived with Spouse, Family (8 kids) in a House home.  Home access is 4Stairs to enter.  Patient will benefit from skilled PT intervention to maximize safe functional mobility, minimize fall risk and decrease caregiver burden for planned discharge home with 24 hour supervision.  Anticipate patient will benefit from follow up [place] at discharge.  PT - End of Session Activity Tolerance: Tolerates 30+ min activity with multiple rests Endurance Deficit: Yes Endurance Deficit Description: Pt's endurance very limited by pain in L torso and her describing feeling out of breath.  Her O2 sats were never below 98% and HR did not get elevated during all ADLs. PT Assessment Rehab Potential (ACUTE/IP ONLY): Good PT Barriers to Discharge: Home environment access/layout PT Patient demonstrates impairments in the following area(s): Balance;Endurance;Motor;Pain;Safety;Sensory PT Transfers Functional Problem(s): Bed Mobility;Bed to Chair;Car PT Locomotion Functional Problem(s): Ambulation;Stairs PT Plan PT Intensity: Minimum of 1-2 x/day ,45 to 90 minutes PT Frequency: 5 out of 7 days PT Duration Estimated Length of Stay: 11-14 days PT Treatment/Interventions: Ambulation/gait training;Community reintegration;DME/adaptive equipment instruction;Neuromuscular re-education;Psychosocial support;Stair training;UE/LE Strength taining/ROM;Balance/vestibular  training;Discharge planning;Functional electrical stimulation;Pain management;Skin care/wound management;Therapeutic Activities;UE/LE Coordination activities;Cognitive remediation/compensation;Disease management/prevention;Functional mobility training;Patient/family education;Splinting/orthotics;Therapeutic Exercise;Visual/perceptual remediation/compensation PT Transfers  Anticipated Outcome(s): Supervision PT Locomotion Anticipated Outcome(s): Supervision PT Recommendation Recommendations for Other Services: Neuropsych consult Follow Up Recommendations: Home health PT;24 hour supervision/assistance Patient destination: Home Equipment Recommended: To be determined   PT Evaluation Precautions/Restrictions Precautions Precautions: Fall Precaution Comments: left side hemiparesis Restrictions Weight Bearing Restrictions: No General Chart Reviewed: Yes Family/Caregiver Present: No  Home Living/Prior Functioning Home Living Living Arrangements: Spouse/significant other;Children;Other relatives Vision/Perception  Perception Perception: Within Functional Limits Praxis Praxis: Intact  Cognition Overall Cognitive Status: Within Functional Limits for tasks assessed Arousal/Alertness: Awake/alert Orientation Level: Oriented X4 Memory: Appears intact Immediate Memory Recall: Sock;Blue;Bed Memory Recall Sock: Without Cue Memory Recall Blue: Without Cue Memory Recall Bed: With Cue Awareness: Appears intact Problem Solving: Appears intact Safety/Judgment: Appears intact Sensation Sensation Light Touch: Impaired by gross assessment Hot/Cold: Impaired by gross assessment (impaired in LUE and LLE) Proprioception: Impaired by gross assessment (impaired in LUE and LLE) Stereognosis: Impaired by gross assessment Additional Comments: Diminished LUE and LLE. Can correctly identify light touch 75% of time LLE. Coordination Gross Motor Movements are Fluid and Coordinated: No Fine Motor  Movements are Fluid and Coordinated: No Coordination and Movement Description: limited by L hemiplegia Finger Nose Finger Test: Unable to complete on L side; functional on R Motor  Motor Motor: Hemiplegia Motor - Skilled Clinical Observations: pt has approximately 25% of functional movement in LUE   Trunk/Postural Assessment  Cervical Assessment Cervical Assessment: Within Functional Limits Thoracic Assessment Thoracic Assessment: Within Functional Limits Lumbar Assessment Lumbar Assessment: Within Functional Limits Postural Control Postural Control: Within Functional Limits  Balance Static Sitting Balance Static Sitting - Level of Assistance: 5: Stand by assistance Dynamic Sitting Balance Dynamic Sitting - Level of Assistance: 5: Stand by assistance Sitting balance - Comments: seated at EOB Static Standing Balance Static Standing - Level of Assistance: 4: Min assist Dynamic Standing Balance Dynamic Standing - Balance Support: During functional activity Dynamic Standing - Level of Assistance: 3: Mod assist Dynamic Standing - Comments: not tested to reach out of base of support on eval due to pt c/o intense L side pain Extremity Assessment    LUE Assessment Brun[place]um level for arm: Stage II Synergy is developing Brunstrum level for hand: Stage II Synergy is developing RLE Assessment RLE Assessment: Within Functional Limits LLE Assessment LLE Assessment: Exceptions to [facility] General Strength Comments: Grossly 3-/5  Care Tool Care Tool Bed Mobility Roll left and right activity   Roll left and right assist level: Supervision/Verbal cueing    Sit to lying activity   Sit to lying assist level: Minimal Assistance - Patient > 75%    Lying to sitting edge of bed activity   Lying to sitting edge of bed assist level: Minimal Assistance - Patient > 75%     Care Tool Transfers Sit to stand transfer   Sit to stand assist level: Moderate Assistance - Patient 50 - 74%     Chair/bed transfer   Chair/bed transfer assist level: Moderate Assistance - Patient 50 - 74%     Toilet transfer   Assist Level: Minimal Assistance - Patient > 75%    Car transfer Car transfer activity did not occur: Environmental limitations        Care Tool Locomotion Ambulation   Assist level: Moderate Assistance - Patient 50 - 74% Assistive device: No Device Max distance: 25'  Walk 10 feet activity   Assist level: Moderate Assistance - Patient - 50 - 74% Assistive device: No Device   Walk 50 feet with 2 turns activity  Walk 50 feet with 2 turns activity did not occur: Safety/medical concerns      Walk 150 feet activity Walk 150 feet activity did not occur: Safety/medical concerns      Walk 10 feet on uneven surfaces activity   Assist level: Minimal Assistance - Patient > 75% Assistive device: Walker-rolling  Stairs   Assist level: Moderate Assistance - Patient - 50 - 74% Stairs assistive device: 2 hand rails Max number of stairs: 4  Walk up/down 1 step activity   Walk up/down 1 step (curb) assist level: Moderate Assistance - Patient - 50 - 74% Walk up/down 1 step or curb assistive device: 2 hand rails    Walk up/down 4 steps activity Walk up/down 4 steps assist level: Moderate Assistance - Patient - 50 - 74% Walk up/down 4 steps assistive device: 2 hand rails  Walk up/down 12 steps activity Walk up/down 12 steps activity did not occur: Safety/medical concerns      Pick up small objects from floor Pick up small object from the floor (from standing position) activity did not occur: Safety/medical concerns      Wheelchair Will patient use wheelchair at discharge?: No          Wheel 50 feet with 2 turns activity      Wheel 150 feet activity        Refer to Care Plan for Long Term Goals  SHORT TERM GOAL WEEK 1 PT Short Term Goal 1 (Week 1): Pt will perform bed mobility with supervision. PT Short Term Goal 2 (Week 1): Pt will perform bed to chair transfer  with CGA. PT Short Term Goal 3 (Week 1): Pt will ambulate x50' with minA and no AD. PT Short Term Goal 4 (Week 1): Pt will complete 4 steps with LHR and minA.  Recommendations for other services: Neuropsych  Skilled Therapeutic Intervention  Evaluation completed (see details above and below) with education on PT POC and goals and individual treatment initiated with focus on bed mobility, balance, and functional transfers.  1st Session: Pt received supine in bed and agrees to therapy. No complaint of pain. Supine to sit with bed features and minA. Pt performs sit to stand from EOB with modA HHA and PT provides modA for donning pants. Stand pivot transfer to [facility] with modA. WC transport to gym for time management.  Pt performs multiple reps of sit to stand with minA/modA. In standing, PT provides minA and tactile cuing at LLE to facilitate L knee extension. Pt able to maintain static standing balance without BUE with minA for 1-2 minutes at a time. Pt does report chest pain following standing activities. Pain described as sharp, in center of chest. RN informed and pain relieved with rest.    Pt performs SPT to mat table with modA. Supine<>sit with minA and verbal and tactile cues for body mechanics and positioning. Lateral scoot transfer to [facility] with minA. Stand step from [facility] back to bed with minA. Left supine in bed with alarm intact and all needs within reach.  2nd Session: Pt received supine in bed and agreeable to therapy. Reports pain in L side and has heat pack on L flank to relieve pain. Supine to sit with supervision and use of bed features and increased time. Stand pivot transfer to [facility] with minA. WC transport to gym for time management  Pt ambulates 50' with RW and minA +1 and +2 WC follow for safety, including ascending and descending ramp. Pt demos step to gait  pattern and very short stance time with LLE.   Pt performs x4 steps with BHRs and modA. Then ambulates 25' without AD and modA from  PT. PT cues for increased knee extension during stance phase and increased L lateral weight shift for improved mechanics.   Pt performs toilet transfer with RW and minA. Left supine in bed with alarm intat and all needs within reach.  Mobility Bed Mobility Bed Mobility: Supine to Sit;Sit to Supine Supine to Sit: Minimal Assistance - Patient > 75% Sit to Supine: Minimal Assistance - Patient > 75% Transfers Transfers: Sit to Stand;Stand to Sit;Stand Pivot Transfers Sit to Stand: Moderate Assistance - Patient 50-74% Stand to Sit: Moderate Assistance - Patient 50-74% Stand Pivot Transfers: Moderate Assistance - Patient 50 - 74% Stand Pivot Transfer Details: Verbal cues for technique;Verbal cues for precautions/safety;Tactile cues for weight shifting Transfer (Assistive device): None Locomotion  Gait Ambulation: Yes Gait Assistance: 2 Helpers Gait Distance (Feet): 50 Feet Assistive device: Rolling walker Gait Assistance Details: Verbal cues for technique;Verbal cues for precautions/safety;Tactile cues for weight shifting;Verbal cues for safe use of DME/AE Gait Gait: Yes Gait Pattern: Impaired Gait Pattern: Step-to pattern;Decreased weight shift to left;Decreased stance time - left Gait velocity: decreased Stairs / Additional Locomotion Stairs: Yes Stairs Assistance: Moderate Assistance - Patient 50 - 74% Stair Management Technique: Two rails Number of Stairs: 4 Height of Stairs: 6 Ramp: Minimal Assistance - Patient >75% Curb: Moderate Assistance - Patient 50 - 74% Wheelchair Mobility Wheelchair Mobility: No   Discharge Criteria: Patient will be discharged from PT if patient refuses treatment 3 consecutive times without medical reason, if treatment goals not met, if there is a change in medical status, if patient makes no progress towards goals or if patient is discharged from hospital.  The above assessment, treatment plan, treatment alternatives and goals were discussed and  mutually agreed upon: by patient  Breck Coons, PT, DPT 03/11/2020, 4:00 PM

## 2020-03-11 NOTE — Progress Notes (Signed)
Pt's call bell on in hallway. Entered pt's room. Pt states "I called 30 minutes ago for my medicine" Explained to pt I was not aware that she called for pain medication or called at all. Immediately went to get pt medications requested. Notified charge nurse of pt's second complaint since 1900.

## 2020-03-11 NOTE — Progress Notes (Signed)
  Echocardiogram 2D Echocardiogram has been performed.  Joyce [name] 03/11/2020, 9:40 AM

## 2020-03-11 NOTE — Evaluation (Signed)
Occupational Therapy Assessment and Plan  Patient Details  Name: Joyce [name] MRN: [id number] Date of Birth: 1975/09/03  OT Diagnosis: acute pain, hemiplegia affecting non-dominant side and muscle weakness (generalized) Rehab Potential: Rehab Potential (ACUTE ONLY): Excellent ELOS: 14 days   Today's Date: 03/11/2020 OT Individual Time: 0935-1050 OT Individual Time Calculation (min): 75 min     Hospital Problem: Principal Problem:   Left hemiparesis ([place]) Active Problems:   Right thalamic infarction [facility])   Past Medical History:  Past Medical History:  Diagnosis Date  . Acute CVA (cerebrovascular accident) ([place]) 03/04/2020  . AMA (advanced maternal age) multigravida 35+ 11/12/2014  . Anemia   . Depression   . Hypertension   . Migraine   . Pregnant 11/12/2014  . PTSD (post-traumatic stress disorder) 05/01/2017  . Round ligament pain 11/12/2014  . Short of breath on exertion 11/12/2014  . Trichomonas infection    Past Surgical History:  Past Surgical History:  Procedure Laterality Date  . CHOLECYSTECTOMY      Assessment & Plan Clinical Impression:  Joyce [name] is a 44 year old right-handed female with documented history of hypertension, depression and migraine headaches. Per chart review patient lives with her spouse and children. Independent prior to admission. Two-level home bed and bath upstairs. 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 Presented to [facility] 03/04/2020 left-sided weakness. She denied any chest pain shortness of breath nausea or vomiting. Cranial CT scan showed no acute changes. 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 Carotid Dopplers with no ICA stenosis. MRA of the head with no proximal intracranial vessel occlusion or significant stenosis. Admission chemistries unremarkable except glucose 114, alcohol negative, hemoglobin 15.4, WBC 12,200. Echocardiogram with ejection fraction of 60 to 65% no wall motion  abnormalities. Presently maintained on aspirin and Plavix for CVA prophylaxis x1 month followed by aspirin alone. She continues on Topamax for history of headaches. Tolerating a regular diet. Therapy evaluations completed and patient was admitted for a comprehensive rehab program.  Patient transferred to CIR on 03/10/2020 .    Patient currently requires min with basic self-care skills secondary to muscle weakness, decreased cardiorespiratoy endurance, unbalanced muscle activation and decreased coordination and decreased sitting balance, decreased standing balance, hemiplegia and decreased balance strategies.  Prior to hospitalization, patient was fully independent, working FT.  Patient will benefit from skilled intervention to increase independence with basic self-care skills and increase level of independence with iADL prior to discharge home with care partner.  Anticipate patient will require intermittent supervision and follow up outpatient.  OT - End of Session Activity Tolerance: Tolerates < 10 min activity, no significant change in vital signs Endurance Deficit: Yes Endurance Deficit Description: Pt's endurance very limited by pain in L torso and her describing feeling out of breath.  Her O2 sats were never below 98% and HR did not get elevated during all ADLs. OT Assessment Rehab Potential (ACUTE ONLY): Excellent OT Patient demonstrates impairments in the following area(s): Balance;Endurance;Motor;Pain;Sensory OT Basic ADL's Functional Problem(s): Eating;Grooming;Bathing;Dressing;Toileting OT Advanced ADL's Functional Problem(s): Light Housekeeping;Laundry OT Transfers Functional Problem(s): Toilet;Tub/Shower OT Additional Impairment(s): Fuctional Use of Upper Extremity OT Plan OT Intensity: Minimum of 1-2 x/day, 45 to 90 minutes OT Frequency: 5 out of 7 days OT Duration/Estimated Length of Stay: 14 days OT Treatment/Interventions: Balance/vestibular training;Self Care/advanced ADL  retraining;Therapeutic Exercise;Neuromuscular re-education;UE/LE Strength taining/ROM;Community reintegration;Patient/family education;UE/LE Coordination activities;Therapeutic Activities;Psychosocial support;Functional mobility training;Discharge planning OT Self Feeding Anticipated Outcome(s): independent OT Basic Self-Care Anticipated Outcome(s): Mod I OT Toileting Anticipated  Outcome(s): Mod I OT Bathroom Transfers Anticipated Outcome(s): Mod I OT Recommendation Recommendations for Other Services: Neuropsych consult Patient destination: Home Follow Up Recommendations: Outpatient OT Equipment Recommended: Tub/shower seat   OT Evaluation Precautions/Restrictions  Precautions Precautions: Fall Precaution Comments: left side hemiparesis Restrictions Weight Bearing Restrictions: No   Pain Pain Assessment Pain Score: 9  Pain Type: Acute pain Pain Location: Rib cage Pain Orientation: Left Pain Descriptors / Indicators: Sharp;Stabbing Pain Onset: Gradual Pain Intervention(s): RN made aware Home Living/Prior [place] expects to be discharged to:: Private residence Living Arrangements: Spouse/significant other, Children Available Help at Discharge: Family, Available 24 hours/day Type of Home: House Home Access: Stairs to enter [employer] of Steps: 4 Entrance Stairs-Rails: Right, Left (can not reach both) Home Layout: Two level, Bed/bath upstairs, Other (Comment) Alternate Level Stairs-Number of Steps: 10-15 (1 flight) Alternate Level Stairs-Rails: Left Bathroom Shower/Tub: Walk-in shower, Door Additional Comments: uses walk in shower on 1st floor but bedroom is upstairs; already has a BSC she uses in her room for frequency  Lives With: Spouse, Family (8 kids) Prior Function Level of Independence: Independent with basic ADLs, Independent with homemaking with ambulation, Independent with gait, Independent with transfers  Able to Take  Stairs?: Yes Driving: Yes Vocation: Full time employment Comments: community ambulator, drives. 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 Vision Baseline Vision/History: No visual deficits Patient Visual Report: No change from baseline Vision Assessment?: No apparent visual deficits Perception  Perception: Within Functional Limits Praxis Praxis: Intact Cognition Overall Cognitive Status: Within Functional Limits for tasks assessed Arousal/Alertness: Awake/alert Orientation Level: Person;Place;Situation Person: Oriented Place: Oriented Situation: Oriented Year: 2021 Month: October Day of Week: Incorrect Memory: Appears intact Immediate Memory Recall: Sock;Blue;Bed Memory Recall Sock: Without Cue Memory Recall Blue: Without Cue Memory Recall Bed: With Cue Awareness: Appears intact Problem Solving: Appears intact Safety/Judgment: Appears intact Sensation Sensation Light Touch: Impaired by gross assessment Hot/Cold: Impaired by gross assessment (impaired in LUE and LLE) Proprioception: Impaired by gross assessment (impaired in LUE and LLE) Stereognosis: Impaired by gross assessment Additional Comments: Diminished LUE and LLE. Can correctly identify light touch 75% of time LLE. Coordination Gross Motor Movements are Fluid and Coordinated: No Fine Motor Movements are Fluid and Coordinated: No Coordination and Movement Description: limited by L hemiplegia Finger Nose Finger Test: Unable to complete on L side; functional on R Motor  Motor Motor: Hemiplegia Motor - Skilled Clinical Observations: pt has approximately 25% of functional movement in LUE  Trunk/Postural Assessment  Cervical Assessment Cervical Assessment: Within Functional Limits Thoracic Assessment Thoracic Assessment: Within Functional Limits Lumbar Assessment Lumbar Assessment: Within Functional Limits Postural Control Postural Control: Within Functional Limits  Balance Static Sitting Balance Static Sitting -  Level of Assistance: 5: Stand by assistance Dynamic Sitting Balance Dynamic Sitting - Level of Assistance: 5: Stand by assistance Static Standing Balance Static Standing - Level of Assistance: 4: Min assist Dynamic Standing Balance Dynamic Standing - Comments: not tested to reach out of base of support on eval due to pt c/o intense L side pain Extremity/Trunk Assessment RUE Assessment RUE Assessment: Within Functional Limits LUE Assessment LUE Assessment: Exceptions to [facility] Passive Range of Motion (PROM) Comments: WFL LUE Body System: Neuro Brunstrum levels for arm and hand: Arm;Hand Brunstrum level for arm: Stage II Synergy is developing Brunstrum level for hand: Stage II Synergy is developing LUE AROM (degrees) Left Shoulder Flexion: 20 Degrees Left Shoulder ABduction: 20 Degrees Left Elbow Flexion: 90 Left Elbow Extension: -20 Left Wrist Extension: -20 Degrees  LUE Strength Left Hand Gross Grasp: Impaired (3/5)  Care Tool Care Tool Self Care Eating   Eating Assist Level: Set up assist    Oral Care    Oral Care Assist Level: Set up assist    Bathing   Body parts bathed by patient: Right arm;Left arm;Chest;Abdomen;Front perineal area;Buttocks;Right upper leg;Left upper leg;Right lower leg;Left lower leg;Face     Assist Level: Supervision/Verbal cueing    Upper Body Dressing(including orthotics)   What is the patient wearing?: Pull over shirt   Assist Level: Set up assist    Lower Body Dressing (excluding footwear)   What is the patient wearing?: Underwear/pull up;Pants Assist for lower body dressing: Minimal Assistance - Patient > 75%    Putting on/Taking off footwear   What is the patient wearing?: Ted hose;Shoes Assist for footwear: Moderate Assistance - Patient 50 - 74%       Care Tool Toileting Toileting activity   Assist for toileting: Minimal Assistance - Patient > 75%     Care Tool Bed Mobility Roll left and right activity    supervision    Sit to  lying activity   Sit to lying assist level: Minimal Assistance - Patient > 75%    Lying to sitting edge of bed activity   Lying to sitting edge of bed assist level: Minimal Assistance - Patient > 75%     Care Tool Transfers Sit to stand transfer   Sit to stand assist level: Minimal Assistance - Patient > 75%    Chair/bed transfer   Chair/bed transfer assist level: Minimal Assistance - Patient > 75%     Toilet transfer   Assist Level: Minimal Assistance - Patient > 75%     Care Tool Cognition Expression of Ideas and Wants Expression of Ideas and Wants: Without difficulty (complex and basic) - expresses complex messages without difficulty and with speech that is clear and easy to understand   Understanding Verbal and Non-Verbal Content Understanding Verbal and Non-Verbal Content: Understands (complex and basic) - clear comprehension without cues or repetitions   Memory/Recall Ability *first 3 days only Memory/Recall Ability *first 3 days only: Current season;Location of own room;Staff names and faces;That he or she is in a hospital/hospital unit    Refer to Care Plan for [place] 1 OT Short Term Goal 1 (Week 1): Pt will demonstrate improved hand grasp to hold a container in L hand to take the top off the container with her R hand. OT Short Term Goal 2 (Week 1): Pt will be able to tolerate standing in shower with close S for at least 5 min to demonstrate improved activity tolerance. OT Short Term Goal 3 (Week 1): Pt will demonstrate improved dynamic standing balance of pulling pants over hips with close S. OT Short Term Goal 4 (Week 1): Pt will complete toilet transfers with S using a RW.  Recommendations for other services: Neuropsych   Skilled Therapeutic Intervention ADL ADL Eating: Set up Grooming: Setup Upper Body Bathing: Supervision/safety Where Assessed-Upper Body Bathing: Shower Lower Body Bathing: Supervision/safety Where Assessed-Lower  Body Bathing: Shower Upper Body Dressing: Supervision/safety Where Assessed-Upper Body Dressing: Wheelchair Lower Body Dressing: Minimal assistance Where Assessed-Lower Body Dressing: Wheelchair Toileting: Minimal assistance Where Assessed-Toileting: Glass blower/designer: Psychiatric nurse Method: Arts development officer: Energy manager: Environmental education officer Method: Radiographer, therapeutic: Transfer tub bench;Grab bars Mobility  Bed Mobility Bed Mobility: Supine  to Sit;Sit to Supine Supine to Sit: Minimal Assistance - Patient > 75% Sit to Supine: Minimal Assistance - Patient > 75% Transfers Sit to Stand: Moderate Assistance - Patient 50-74% Stand to Sit: Moderate Assistance - Patient 50-74%   Pt seen for initial evaluation and ADL training with a focus on use of her LUE and endurance.  Explained role of OT, discussed pt's goals, discussed her lifestyle.  Pt agreeable to a shower.  Pt needed to move slowly due to pain in L torso.  She also fatigued quickly.  We began to stand and ambulate to bathroom with RW, but after 5 steps of pt not moving used wc.  Using stand pivots to toilet and tub bench, pt able to move fairly well but slowly. She could use her L arm in about 25% of activities.  Pt did well this session, pt opted to rest back in bed with all needs met.  Bed alarm set.    Discharge Criteria: Patient will be discharged from OT if patient refuses treatment 3 consecutive times without medical reason, if treatment goals not met, if there is a change in medical status, if patient makes no progress towards goals or if patient is discharged from hospital.  The above assessment, treatment plan, treatment alternatives and goals were discussed and mutually agreed upon: by patient  [facility] 03/11/2020, 12:48 PM

## 2020-03-12 ENCOUNTER — Inpatient Hospital Stay (HOSPITAL_COMMUNITY): Payer: Medicaid Other

## 2020-03-12 ENCOUNTER — Inpatient Hospital Stay (HOSPITAL_COMMUNITY): Payer: Medicaid Other | Admitting: Occupational Therapy

## 2020-03-12 MED ORDER — GABAPENTIN 300 MG PO CAPS
300.0000 mg | ORAL_CAPSULE | Freq: Three times a day (TID) | ORAL | Status: DC
  Administered 2020-03-12 – 2020-03-24 (×36): 300 mg via ORAL
  Filled 2020-03-12 (×36): qty 1

## 2020-03-12 NOTE — Care Management (Signed)
[place] Individual Statement of Services  Patient Name:  Joyce [name]  Date:  03/12/2020  Welcome to the [facility].  Our goal is to provide you with an individualized program based on your diagnosis and situation, designed to meet your specific needs.  With this comprehensive rehabilitation program, you will be expected to participate in at least 3 hours of rehabilitation therapies Monday-Friday, with modified therapy programming on the weekends.  Your rehabilitation program will include the following services:  Physical Therapy (PT), Occupational Therapy (OT), Speech Therapy (ST), 24 hour per day rehabilitation nursing, Therapeutic Recreaction (TR), Psychology, Neuropsychology, Care Coordinator, Rehabilitation Medicine, Nutrition Services, Pharmacy Services and Other  Weekly team conferences will be held on Tuesdays to discuss your progress.  Your Inpatient Rehabilitation Care Coordinator will talk with you frequently to get your input and to update you on team discussions.  Team conferences with you and your family in attendance may also be held.  Expected length of stay: 11-14 days    Overall anticipated outcome: Supervision  Depending on your progress and recovery, your program may change. Your Inpatient Rehabilitation Care Coordinator will coordinate services and will keep you informed of any changes. Your Inpatient Rehabilitation Care Coordinator's name and contact numbers are listed  below.  The following services may also be recommended but are not provided by the [place] will be made to provide these services after discharge if needed.  Arrangements include referral to agencies that provide these services.  Your insurance has been verified to be:  Uninsured  Your  primary doctor is:  Forensic scientist  Pertinent information will be shared with your doctor and your insurance company.  Inpatient Rehabilitation Care Coordinator:  Cathleen Corti [phone] or ([phone]  Information discussed with and copy given to patient by: [name], 03/12/2020, 9:52 AM

## 2020-03-12 NOTE — Plan of Care (Signed)
°  Problem: Consults Goal: RH STROKE PATIENT EDUCATION Description: See Patient Education module for education specifics  Outcome: Progressing   Problem: RH SAFETY Goal: RH STG ADHERE TO SAFETY PRECAUTIONS W/ASSISTANCE/DEVICE Description: STG Adhere to Safety Precautions With cues/reminders Assistance/Device. Outcome: Progressing   Problem: RH COGNITION-NURSING Goal: RH STG ANTICIPATES NEEDS/CALLS FOR ASSIST W/ASSIST/CUES Description: STG Anticipates Needs/Calls for Assist With cues/reminders Assistance/Cues. Outcome: Progressing   Problem: RH PAIN MANAGEMENT Goal: RH STG PAIN MANAGED AT OR BELOW PT'S PAIN GOAL Description: Ator below level 4 Outcome: Progressing   Problem: RH KNOWLEDGE DEFICIT Goal: RH STG INCREASE KNOWLEDGE OF HYPERTENSION Description: Patient will be able to manage HTN with medications and diet using handouts and educational materials with cues/reminders Outcome: Progressing Goal: RH STG INCREASE KNOWLEGDE OF HYPERLIPIDEMIA Description: Patient will be able to manage HLD with medications and diet using handouts and educational materials with cues/reminders Outcome: Progressing Goal: RH STG INCREASE KNOWLEDGE OF STROKE PROPHYLAXIS Description: Patient will be able to manage stroke with medications and diet using handouts and educational materials with cues/reminders Outcome: Progressing

## 2020-03-12 NOTE — Progress Notes (Signed)
Physical Therapy Session Note  Patient Details  Name: Joyce [name] MRN: [id number] Date of Birth: Jun 18, 1975  Today's Date: 03/12/2020 PT Individual Time: 1002-1059 and 1416-1530 PT Individual Time Calculation (min): 57 min and 74 min  Short Term Goals: Week 1:  PT Short Term Goal 1 (Week 1): Pt will perform bed mobility with supervision. PT Short Term Goal 2 (Week 1): Pt will perform bed to chair transfer with CGA. PT Short Term Goal 3 (Week 1): Pt will ambulate x50' with minA and no AD. PT Short Term Goal 4 (Week 1): Pt will complete 4 steps with LHR and minA.  Skilled Therapeutic Interventions/Progress Updates:     1st Session: Pt received seated in [facility] and agrees to therapy. Reports sharp pain in L side. RN aware. PT provides rest breaks as needed to manage pain. WC transport to gym for time management. Pt performs sit to stand holding onto litegait with supervision from PT with cues on body mechanics. Step up onto treadmill with minA.   Pt performs treadmill gait training with litegait for body weight support and stability. Initial bout 2:55 at 0.8 mph for 148'. Mirror positioned for visual feedback. Pt takes very short bilateral stride lengths with decreased knee flexion bilaterally, and decreased stance time on LLE. Following seated rest break, pt practices with marching in place to increase SLS control. 2nd bout of ambulation 3:30 at 0.8 mph for 200'. PT facilitates LLE hip flexion, knee flexion and progression during swing phase. Improved stride lengths and body mechanics on 2nd bout.  Pt steps down from treadmill backward with minA. WC transport back to room. SPT to bed with minA. Sit to supine with supervision. Left supine in bed with alarm intact and all needs within reach.  2nd Session: Pt received supine in bed and agrees to therapy. Reports pain in L flank. PT provides repositioning and rest breaks as needed. Supine to sit to with supervision and cues for hand placement.  SPT to WC with minA and no AD. WC transport to gym for time management. Pt performs NMR in // bars with mirror for visual feedback. Pt performs multiple reps of sit to stand throughout session with CGA. Pt faces mirror holding onto bar with BUEs, performing standing marching, attempting to touch thighs to bar. Pt demos sufficient stability in SLS on LLE to touch R thigh to bar. Unable to touch with L thigh but does clear foot from ground several inches. Several seated rest breaks. Pt then performs lateral weight shifting with no UE support and PT providing CGA.  NMR in quadruped for WB through all 4 extremities. Pt alternates lifting UE for increase challenge and core activation. Pt then performs standing activity, rotating trunk to reach backward with LUE for clothespins, then placing clothespins on basketball net. Activity working on fine motor skills and dynamic standing balance. PT provides CGA/minA.   SPT back to [place] with minA. SPT to bed with minA and return to supine with supervision. Left supine in bed with alarm intact and all needs within reach.  Therapy Documentation Precautions:  Precautions Precautions: Fall Precaution Comments: left side hemiparesis Restrictions Weight Bearing Restrictions: No    Therapy/Group: Individual Therapy  Breck Coons, PT, DPT 03/12/2020, 4:35 PM

## 2020-03-12 NOTE — Progress Notes (Signed)
Patient ID: Joyce [name], female   DOB: 13-Jun-1975, 44 y.o.   MRN: [id number]  Pt reports her PCP is Dr. Lavonia Drafts at [facility] on [name]. States provider prescribes medication for anxiety.   SW scheduled hospital follow-up with Dr. Lavonia Drafts for Monday, November 1 at 11:30am([facility] [place]: [phone]). SW provided appointment information to pt dtr who was in room with pt as pt was on phone.   Loralee Pacas, MSW, [place] Office: [phone] Cell: [phone] Fax: [phone]

## 2020-03-12 NOTE — Progress Notes (Signed)
[place] PHYSICAL MEDICINE & REHABILITATION PROGRESS NOTE   Subjective/Complaints: Continues to have tinging in her fingers which is bothersome to her. Denies chest pain.  Had bubble study yesterday- negative  ROS: +numbness in fingers  Objective:   ECHOCARDIOGRAM LIMITED BUBBLE STUDY  Result Date: 03/11/2020    ECHOCARDIOGRAM LIMITED REPORT   Patient Name:   Joyce [name] Date of Exam: 03/11/2020 Medical Rec #:  [id number]          Height:       66.0 in Accession #:    [id number]         Weight:       227.7 lb Date of Birth:  12/22/1975          BSA:          2.113 m Patient Age:    44 years           BP:           133/82 mmHg Patient Gender: F                  HR:           82 bpm. Exam Location:  Inpatient Procedure: Limited Echo Indications:    PFO  History:        Patient has prior history of Echocardiogram examinations, most                 recent 03/05/2020. Risk Factors:Hypertension. CVA.  Sonographer:    Jannett Celestine RDCS (AE) Referring Phys: [id number] Izora Ribas  Sonographer Comments: Restricted mobility. patient unable to turn nor raise left arm. IMPRESSIONS  1. Agitated saline contrast bubble study was negative, with no evidence of any interatrial shunt both at rest and with valsalva . FINDINGS  IAS/Shunts: Agitated saline contrast was given intravenously to evaluate for intracardiac shunting. Agitated saline contrast bubble study was negative, with no evidence of any interatrial shunt both at rest and with valsalva . Gwyndolyn Kaufman MD Electronically signed by Gwyndolyn Kaufman MD Signature Date/Time: 03/11/2020/11:31:54 AM    Final    Recent Labs    03/11/20 0659  WBC 9.5  HGB 13.5  HCT 42.0  PLT 286   Recent Labs    03/11/20 0659  NA 141  K 3.6  CL 111  CO2 22  GLUCOSE 92  BUN 13  CREATININE 0.85  CALCIUM 9.0    Intake/Output Summary (Last 24 hours) at 03/12/2020 1243 Last data filed at 03/12/2020 0700 Gross per 24 hour  Intake 315 ml  Output 1  ml  Net 314 ml        Physical Exam: Vital Signs Blood pressure 131/77, pulse 80, temperature 98.1 F (36.7 C), resp. rate 15, height 5\' 6"  (1.676 m), weight 103.3 kg, SpO2 100 %. General: Alert and oriented x 3, No apparent distress HEENT: L facial droop at rest, but equalizes with smile; tongue midline- coated, poor dentition Neck: Supple without JVD or lymphadenopathy Heart: Reg rate and rhythm. No murmurs rubs or gallops Chest: CTA bilaterally without wheezes, rales, or rhonchi; no distress Abdomen: Soft, non-tender, non-distended, bowel sounds positive. Musculoskeletal:     Cervical back: Rigidity and tenderness present.     Comments: RUE- 5/5 in deltoid, biceps, triceps, WE, grip and finger abd LUE- Deltoid/biceps 4-/5, Triceps/WE 4/5, grip 4/5, finger abd 4/5 RLE- HF, KE, DF and PF 5/5 LLE_ HF 3-/5, KE 2+/5, DF 3+/5, PF 4-/5   Skin:    Comments: No skin  breakdown seen Has a large callus on R first MTP/bottom of foot Pimple forming on nose Biting inside mouth- bleeding a little on left in mouth Neurological:     Comments: Patient is alert in no acute distress.  Speech is fluent.  Oriented x3 and follows commands. Decreased sensation to light touch on L side- LUE <LLE Also numb in V2/V3 on L side of face- not V1  Psychiatric:     Comments: Slightly anxious     Assessment/Plan: 1. Functional deficits secondary to left hemiparesis which require 3+ hours per day of interdisciplinary therapy in a comprehensive inpatient rehab setting.  Physiatrist is providing close team supervision and 24 hour management of active medical problems listed below.  Physiatrist and rehab team continue to assess barriers to discharge/monitor patient progress toward functional and medical goals  Care Tool:  Bathing    Body parts bathed by patient: Right arm, Left arm, Chest, Abdomen, Front perineal area, Buttocks, Right upper leg, Left upper leg, Right lower leg, Left lower leg, Face          Bathing assist Assist Level: Supervision/Verbal cueing     Upper Body Dressing/Undressing Upper body dressing   What is the patient wearing?: Pull over shirt    Upper body assist Assist Level: Independent    Lower Body Dressing/Undressing Lower body dressing      What is the patient wearing?: Underwear/pull up, Pants     Lower body assist Assist for lower body dressing: Contact Guard/Touching assist     Toileting Toileting    Toileting assist Assist for toileting: Minimal Assistance - Patient > 75%     Transfers Chair/bed transfer  Transfers assist     Chair/bed transfer assist level: Minimal Assistance - Patient > 75%     Locomotion Ambulation   Ambulation assist      Assist level: Moderate Assistance - Patient 50 - 74% Assistive device: No Device Max distance: 25'   Walk 10 feet activity   Assist     Assist level: Moderate Assistance - Patient - 50 - 74% Assistive device: No Device   Walk 50 feet activity   Assist Walk 50 feet with 2 turns activity did not occur: Safety/medical concerns         Walk 150 feet activity   Assist Walk 150 feet activity did not occur: Safety/medical concerns         Walk 10 feet on uneven surface  activity   Assist     Assist level: Minimal Assistance - Patient > 75% Assistive device: Aeronautical engineer Will patient use wheelchair at discharge?: No             Wheelchair 50 feet with 2 turns activity    Assist            Wheelchair 150 feet activity     Assist          Blood pressure 131/77, pulse 80, temperature 98.1 F (36.7 C), resp. rate 15, height 5\' 6"  (1.676 m), weight 103.3 kg, SpO2 100 %.    Medical Problem List and Plan: 1.  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   Antithrombotics: -DVT/anticoagulation: SCDs             -  antiplatelet therapy: Aspirin 305 mg daily and Plavix 75 mg daily x1 month then aspirin alone 3. Pain Management: Lidoderm patch, oxycodone as needed- has migraines'hx of panic attacks. 10/14: gbapentin 300 TID added for trigeminal neuralgia. 4. Mood: Provide emotional support             -antipsychotic agents: N/A 5. Neuropsych: This patient is capable of making decisions on her own behalf. Would benefit from neuropsych eval.  6. Skin/Wound Care: Routine skin checks. May d/c IV.  7. Fluids/Electrolytes/Nutrition: Routine in and outs with follow-up chemistries 8.  Migraine headaches.  Topamax 25 mg daily- could use lidocaine patches on neck for HA's.  9.  Hyperlipidemia.  Lipitor 10. Hypertensive emergency: BP went up to 220s/1teens on admision- improved with 1x dose of IV labetalol.  10/14:  Normotensive this morning. Her home amlodipine dose was restarted.  11. Chest pain on 10/12: EKG was obtained and showed nonspecific ST elevation. Nitroglycerin and oxygen were administered. Episode resolved. She does have some chest pressure while working with therapy this morning; it is nontender to palpation. Continue to monitor closely.  12. Low albumin: supplement initiated 10/13.  13. Numbness in inside of mouth- has been biting herself. There is numbness throughout.   LOS: 2 days A FACE TO FACE EVALUATION WAS PERFORMED  Clide Deutscher Shylo Zamor 03/12/2020, 12:43 PM

## 2020-03-12 NOTE — Progress Notes (Signed)
Occupational Therapy Session Note  Patient Details  Name: Joyce [name] MRN: [id number] Date of Birth: August 06, 1975  Today's Date: 03/12/2020 OT Individual Time: 3748-2707 OT Individual Time Calculation (min): 58 min    Short Term Goals: Week 1:  OT Short Term Goal 1 (Week 1): Pt will demonstrate improved hand grasp to hold a container in L hand to take the top off the container with her R hand. OT Short Term Goal 2 (Week 1): Pt will be able to tolerate standing in shower with close S for at least 5 min to demonstrate improved activity tolerance. OT Short Term Goal 3 (Week 1): Pt will demonstrate improved dynamic standing balance of pulling pants over hips with close S. OT Short Term Goal 4 (Week 1): Pt will complete toilet transfers with S using a RW.  Skilled Therapeutic Interventions/Progress Updates:    Pt received in bed ready for a shower today. Prior to getting out of bed, pt worked on AROM exercises for her LUE. She now has 90 degrees of sh flexion in sit and supine which is a huge improvement from yesterday.  She worked on [employer] mobility in several planes along with forearm rotation.  Improved L hand grasp and functional movement.  Pt sat to EOB with S and continues to have left torso/hip/leg pain.  Therefore to save her energy for PT session, used a wc to transport in and out of bathroom. Pt completed all transfers with CGA -min A and did very well with all self care at a S to CGA.  She is now actively using her L hand with 75% of her activities.  See ADL documentation below.    Pt resting in wc with alarm on and all needs met.  (placed warm pack on pt's L hip/abdomen at end of session for 30 min until next PT session)  Therapy Documentation Precautions:  Precautions Precautions: Fall Precaution Comments: left side hemiparesis Restrictions Weight Bearing Restrictions: No       Pain: Pain Assessment Pain Score: 0-No pain ADL: ADL Eating: Set up Grooming:  Independent Upper Body Bathing: Supervision/safety Where Assessed-Upper Body Bathing: Shower Lower Body Bathing: Supervision/safety Where Assessed-Lower Body Bathing: Shower Upper Body Dressing: Supervision/safety Where Assessed-Upper Body Dressing: Wheelchair Lower Body Dressing: Minimal assistance Where Assessed-Lower Body Dressing: Wheelchair Toileting: Minimal assistance Where Assessed-Toileting: Glass blower/designer: Psychiatric nurse Method: Arts development officer: Energy manager: Environmental education officer Method: Radiographer, therapeutic: Radio broadcast assistant, Grab bars   Therapy/Group: Individual Therapy  [place] 03/12/2020, 12:05 PM

## 2020-03-13 ENCOUNTER — Inpatient Hospital Stay (HOSPITAL_COMMUNITY): Payer: Medicaid Other

## 2020-03-13 ENCOUNTER — Inpatient Hospital Stay (HOSPITAL_COMMUNITY): Payer: Medicaid Other | Admitting: Occupational Therapy

## 2020-03-13 NOTE — Progress Notes (Signed)
Occupational Therapy Session Note  Patient Details  Name: Joyce [name] MRN: [id number] Date of Birth: 10/13/1975  Today's Date: 03/13/2020 OT Individual Time: 1000-1045 OT Individual Time Calculation (min): 45 min    Short Term Goals: Week 1:  OT Short Term Goal 1 (Week 1): Pt will demonstrate improved hand grasp to hold a container in L hand to take the top off the container with her R hand. OT Short Term Goal 2 (Week 1): Pt will be able to tolerate standing in shower with close S for at least 5 min to demonstrate improved activity tolerance. OT Short Term Goal 3 (Week 1): Pt will demonstrate improved dynamic standing balance of pulling pants over hips with close S. OT Short Term Goal 4 (Week 1): Pt will complete toilet transfers with S using a RW.  Skilled Therapeutic Interventions/Progress Updates:    Pt seen this session for ADL training with a focus on balance and LUE functional use.  Pt received in wc eating her breakfast using B hands well to cut the sausage.   She then stood up and worked on reaching her arms overhead. She can now reach her L arm to 150 degrees which is a huge improvement.    Pt worked on all sit to stands without the RW with S.  She continues to walk with stepping her R foot to meet her L versus stepping through. With cues for a full step through, pt did extremely well and able to follow through the rest of the session. She ambulated in and out of the bathroom with RW with close S, completed all self care with S.  At end of session, opted to go back to bed.  Resting in bed with all needs met.   Therapy Documentation Precautions:  Precautions Precautions: Fall Precaution Comments: left side hemiparesis Restrictions Weight Bearing Restrictions: No  Pain: Pain Assessment Pain Score: 0-No pain ADL: ADL Eating: Independent Grooming: Independent Upper Body Bathing: Setup Where Assessed-Upper Body Bathing: Shower Lower Body Bathing:  Supervision/safety Where Assessed-Lower Body Bathing: Shower Upper Body Dressing: Independent Where Assessed-Upper Body Dressing: Wheelchair Lower Body Dressing: Supervision/safety Where Assessed-Lower Body Dressing: Wheelchair Toileting: Supervision/safety Where Assessed-Toileting: Toilet Toilet Transfer: Close supervision Toilet Transfer Method: Stand pivot Toilet Transfer Equipment: Grab bars Walk-In Shower Transfer: Close supervision Walk-In Shower Transfer Method: Stand pivot Walk-In Shower Equipment: Transfer tub bench, Grab bars   Therapy/Group: Individual Therapy  , 03/13/2020, 10:49 AM 

## 2020-03-13 NOTE — Progress Notes (Signed)
[place] PHYSICAL MEDICINE & REHABILITATION PROGRESS NOTE   Subjective/Complaints: The Gabapentin has really helped her trigeminal neuralgia without negative effects The Topamax is helping her headache Discussed meditation to help reduce her anxiety- recommended [employer] app which she will try.   ROS: +numbness in fingers, pain improved  Objective:   No results found. Recent Labs    03/11/20 0659  WBC 9.5  HGB 13.5  HCT 42.0  PLT 286   Recent Labs    03/11/20 0659  NA 141  K 3.6  CL 111  CO2 22  GLUCOSE 92  BUN 13  CREATININE 0.85  CALCIUM 9.0    Intake/Output Summary (Last 24 hours) at 03/13/2020 1129 Last data filed at 03/13/2020 0900 Gross per 24 hour  Intake 720 ml  Output --  Net 720 ml        Physical Exam: Vital Signs Blood pressure (!) 125/59, pulse 73, temperature 98.3 F (36.8 C), resp. rate 16, height 5\' 6"  (1.676 m), weight 103.3 kg, SpO2 100 %. General: Alert and oriented x 3, No apparent distress HEENT: L facial droop at rest, but equalizes with smile; tongue midline- coated, poor dentition Neck: Supple without JVD or lymphadenopathy Heart: Reg rate and rhythm. No murmurs rubs or gallops Chest: CTA bilaterally without wheezes, rales, or rhonchi; no distress Abdomen: Soft, non-tender, non-distended, bowel sounds positive. Musculoskeletal:     Cervical back: Rigidity and tenderness present.     Comments: RUE- 5/5 in deltoid, biceps, triceps, WE, grip and finger abd LUE- Deltoid/biceps 4-/5, Triceps/WE 4/5, grip 4/5, finger abd 4/5 RLE- HF, KE, DF and PF 5/5 LLE_ HF 3-/5, KE 2+/5, DF 3+/5, PF 4-/5   Skin:    Comments: No skin breakdown seen Has a large callus on R first MTP/bottom of foot Pimple forming on nose Biting inside mouth- bleeding a little on left in mouth Neurological:     Comments: Patient is alert in no acute distress.  Speech is fluent.  Oriented x3 and follows commands. Decreased sensation to light touch on L side- LUE  <LLE Also numb in V2/V3 on L side of face- not V1  Psychiatric:     Comments: Slightly anxious       Assessment/Plan: 1. Functional deficits secondary to left hemiparesis which require 3+ hours per day of interdisciplinary therapy in a comprehensive inpatient rehab setting.  Physiatrist is providing close team supervision and 24 hour management of active medical problems listed below.  Physiatrist and rehab team continue to assess barriers to discharge/monitor patient progress toward functional and medical goals  Care Tool:  Bathing    Body parts bathed by patient: Right arm, Left arm, Chest, Abdomen, Front perineal area, Buttocks, Right upper leg, Left upper leg, Right lower leg, Left lower leg, Face         Bathing assist Assist Level: Supervision/Verbal cueing     Upper Body Dressing/Undressing Upper body dressing   What is the patient wearing?: Pull over shirt    Upper body assist Assist Level: Independent    Lower Body Dressing/Undressing Lower body dressing      What is the patient wearing?: Pants, Underwear/pull up     Lower body assist Assist for lower body dressing: Supervision/Verbal cueing     Toileting Toileting    Toileting assist Assist for toileting: Supervision/Verbal cueing     Transfers Chair/bed transfer  Transfers assist     Chair/bed transfer assist level: Supervision/Verbal cueing     Locomotion Ambulation   Ambulation  assist      Assist level: Total Assistance - Patient < 25% Assistive device: Lite Gait Max distance: 200'   Walk 10 feet activity   Assist     Assist level: Total Assistance - Patient < 25% Assistive device: Lite Gait   Walk 50 feet activity   Assist Walk 50 feet with 2 turns activity did not occur: Safety/medical concerns         Walk 150 feet activity   Assist Walk 150 feet activity did not occur: Safety/medical concerns  Assist level: Total Assistance - Patient < 25% Assistive  device: Lite Gait    Walk 10 feet on uneven surface  activity   Assist     Assist level: Minimal Assistance - Patient > 75% Assistive device: Aeronautical engineer Will patient use wheelchair at discharge?: No             Wheelchair 50 feet with 2 turns activity    Assist            Wheelchair 150 feet activity     Assist          Blood pressure (!) 125/59, pulse 73, temperature 98.3 F (36.8 C), resp. rate 16, height 5\' 6"  (1.676 m), weight 103.3 kg, SpO2 100 %.    Medical Problem List and Plan: 1.  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  Antithrombotics: -DVT/anticoagulation: SCDs             -antiplatelet therapy: Aspirin 305 mg daily and Plavix 75 mg daily x1 month then aspirin alone 3. Pain Management: Lidoderm patch, oxycodone as needed- has migraines'hx of panic attacks. 10/14: gabapentin 300 TID added for trigeminal neuralgia. 10/15: the latter has greatly helped w/o side effects.   4. Mood: Provide emotional support             -antipsychotic agents: N/A 5. Neuropsych: This patient is capable of making decisions on her own behalf. Would benefit from neuropsych eval.  6. Skin/Wound Care: Routine skin checks. May d/c IV.  7. Fluids/Electrolytes/Nutrition: Routine in and outs with follow-up chemistries 8.  Migraine headaches.  Topamax 25 mg daily- could use lidocaine patches on neck for HA's.  9.  Hyperlipidemia.  Lipitor 10. Hypertensive emergency: BP went up to 220s/1teens on admision- improved with 1x dose of IV labetalol.  10/15:  BP labile- continue to monitor. Her home amlodipine dose was restarted.  11. Chest pain on 10/12: EKG was obtained and showed nonspecific ST elevation. Nitroglycerin and oxygen were administered. Episode resolved. She does have some chest pressure while working with  therapy this morning; it is nontender to palpation. Continue to monitor closely.   10/15: discussed meditation to help relieve anxiety and panic attacks and she is agreeable to trying 12. Low albumin: supplement initiated 10/13.  13. Numbness in inside of mouth- has been biting herself. There is numbness throughout.   LOS: 3 days A FACE TO FACE EVALUATION WAS PERFORMED  Andra Heslin P Aliou Mealey 03/13/2020, 11:29 AM

## 2020-03-13 NOTE — Plan of Care (Signed)
  Problem: Consults Goal: RH STROKE PATIENT EDUCATION Description: See Patient Education module for education specifics  Outcome: Progressing   Problem: RH SAFETY Goal: RH STG ADHERE TO SAFETY PRECAUTIONS W/ASSISTANCE/DEVICE Description: STG Adhere to Safety Precautions With cues/reminders Assistance/Device. Outcome: Progressing   Problem: RH COGNITION-NURSING Goal: RH STG ANTICIPATES NEEDS/CALLS FOR ASSIST W/ASSIST/CUES Description: STG Anticipates Needs/Calls for Assist With cues/reminders Assistance/Cues. Outcome: Progressing   Problem: RH PAIN MANAGEMENT Goal: RH STG PAIN MANAGED AT OR BELOW PT'S PAIN GOAL Description: Ator below level 4 Outcome: Progressing   Problem: RH KNOWLEDGE DEFICIT Goal: RH STG INCREASE KNOWLEDGE OF HYPERTENSION Description: Patient will be able to manage HTN with medications and diet using handouts and educational materials with cues/reminders Outcome: Progressing Goal: RH STG INCREASE KNOWLEGDE OF HYPERLIPIDEMIA Description: Patient will be able to manage HLD with medications and diet using handouts and educational materials with cues/reminders Outcome: Progressing Goal: RH STG INCREASE KNOWLEDGE OF STROKE PROPHYLAXIS Description: Patient will be able to manage stroke with medications and diet using handouts and educational materials with cues/reminders Outcome: Progressing

## 2020-03-13 NOTE — Progress Notes (Signed)
Patient ID: Joyce [name], female   DOB: Aug 19, 1975, 45 y.o.   MRN: [id number]  SW received FMLA forms from pt. Forms completed and faxed to [facility]/Benefits Dept [phone] or [phone]/f:[phone]). Pt provided with completed forms.   Loralee Pacas, MSW, [place] Office: [phone] Cell: [phone] Fax: [phone]

## 2020-03-13 NOTE — IPOC Note (Signed)
Overall Plan of Care [facility]) Patient Details Name: Joyce [name] MRN: [id number] DOB: 08-Oct-1975  Admitting Diagnosis: Left hemiparesis [facility])  Hospital Problems: Principal Problem:   Left hemiparesis ([place]) Active Problems:   Right thalamic infarction [facility])     Functional Problem List: Nursing Endurance, Medication Management, Sensory, Safety, Pain  PT Balance, Endurance, Motor, Pain, Safety, Sensory  OT Balance, Endurance, Motor, Pain, Sensory  SLP    TR         Basic ADLs: OT Eating, Grooming, Bathing, Dressing, Toileting     Advanced  ADLs: OT Light Housekeeping, Laundry     Transfers: PT Bed Mobility, Bed to Chair, Teacher, early years/pre, Tub/Shower     Locomotion: PT Ambulation, Stairs     Additional Impairments: OT Fuctional Use of Upper Extremity  SLP        TR      Anticipated Outcomes Item Anticipated Outcome  Self Feeding independent  Swallowing      Basic self-care  Mod I  Toileting  Mod I   Bathroom Transfers Mod I  Bowel/Bladder     Transfers  Supervision  Locomotion  Supervision  Communication     Cognition     Pain  Pain managed at or below level 4  Safety/Judgment  Maintain safety with cues/reminders   Therapy Plan: PT Intensity: Minimum of 1-2 x/day ,45 to 90 minutes PT Frequency: 5 out of 7 days PT Duration Estimated Length of Stay: 11-14 days OT Intensity: Minimum of 1-2 x/day, 45 to 90 minutes OT Frequency: 5 out of 7 days OT Duration/Estimated Length of Stay: 14 days     Due to the current state of emergency, patients may not be receiving their 3-hours of Medicare-mandated therapy.   Team Interventions: Nursing Interventions Patient/Family Education, Pain Management, Discharge Planning, Medication Management, Disease Management/Prevention  PT interventions Ambulation/gait training, Community reintegration, DME/adaptive equipment instruction, Neuromuscular re-education, Psychosocial support, Stair training, UE/LE  Strength taining/ROM, Training and development officer, Discharge planning, Functional electrical stimulation, Pain management, Skin care/wound management, Therapeutic Activities, UE/LE Coordination activities, Cognitive remediation/compensation, Disease management/prevention, Functional mobility training, Patient/family education, Splinting/orthotics, Therapeutic Exercise, Visual/perceptual remediation/compensation  OT Interventions Balance/vestibular training, Self Care/advanced ADL retraining, Therapeutic Exercise, Neuromuscular re-education, UE/LE Strength taining/ROM, Community reintegration, Barrister's clerk education, UE/LE Coordination activities, Therapeutic Activities, Psychosocial support, Functional mobility training, Discharge planning  SLP Interventions    TR Interventions    SW/CM Interventions Discharge Planning, Psychosocial Support, Patient/Family Education   Barriers to Discharge MD  Medical stability  Nursing      PT Home environment access/layout    OT      SLP      SW Other (comments) Pt uninsured   Team Discharge Planning: Destination: PT-Home ,OT- Home , SLP-  Projected Follow-up: PT-Home health PT, 24 hour supervision/assistance, OT-  Outpatient OT, SLP-  Projected Equipment Needs: PT-To be determined, OT- Tub/shower seat, SLP-  Equipment Details: PT- , OT-  Patient/family involved in discharge planning: PT- Patient,  OT-Patient, SLP-   MD ELOS: 10-14 days modI Medical Rehab Prognosis:  Excellent Assessment: [name] is a 44 year old woman who is admitted to CIR with left side weakness secondary to acute lateral right thalamic infarction. Pain is currently well controlled with oxycodone as needed and Gabapentin TID. Upon admission to CIR she had hypertensive emergency secondary to a panic attack with associated chest pain. Her BP improved with IX labetalol x2 and troponin was negative. EKG had nonspecific ST elevation. She has trigeminal neuralgia which has responded  well  to Gabapentin 100mg  TID. Topamax has been helping her headaches. Her BP has been well controlled for the remained of her stay- she has been restarted on her home amlodipine. Prosource as been started for hypoalbuminemia.   See Team Conference Notes for weekly updates to the plan of care

## 2020-03-13 NOTE — Progress Notes (Signed)
Physical Therapy Session Note  Patient Details  Name: Joyce [name] MRN: [id number] Date of Birth: 06/22/1975  Today's Date: 03/13/2020 PT Individual Time: 0802-0859 and 1416-1530 PT Individual Time Calculation (min): 57 min and 74 min  Short Term Goals: Week 1:  PT Short Term Goal 1 (Week 1): Pt will perform bed mobility with supervision. PT Short Term Goal 2 (Week 1): Pt will perform bed to chair transfer with CGA. PT Short Term Goal 3 (Week 1): Pt will ambulate x50' with minA and no AD. PT Short Term Goal 4 (Week 1): Pt will complete 4 steps with LHR and minA.  Skilled Therapeutic Interventions/Progress Updates:     1st Session: Pt received supine in bed and agrees to therapy. No complaint of pain. Supine to sit with supervision. Stand step transfer to toilet without AD and minA. Toileting and pericare independent. WC transport outside for time management.   Pt performs gait training over various surfaces in open environment for community mobility training. PT provides multimodal cuing for upright gaze, increased lateral weight shifting and stance time, especially throughout L hemibody, increased knee extension at initial contact and push off through toes at terminal stance.  Pt ambulates without AD and completes 80', 100', and 80', with minA at hips and seated rest breaks. Pt utilizes swing though gait pattern but consistently scuffs left foot during swing phase. Able to correct with cuing but quickly returns to prior gait pattern.  Pt left seated in WC with alarm intact and all needs within reach.  2nd Session: Pt received supine in bed and agrees to therapy. Reports mild pain in L side. Supine to sit with supervision with verbal cues on positioning. Stand pivot transfer to [facility] with CGA. WC transport to gym for time management.  Pt performs treadmill training with Litegait for bodyweight support and stability. Pt able to stand to litegait with close supervision. PT dons harness and  stepup onto treadmill with minA. Pt ambulates following bouts on treadmill: 4:15 at 288' at 0.55mph 4:16 for 374' at 1.0 mph PT places rubber pads on treadmill to provide physical obstacles for pt to step over to encourage lateral weight shifting and increased stride lengths and step height.  Pt ambulates overground 140' with minA and no AD. Pt performs stand step transfer to toilet with minA. Left supine in bed with alarm intact and all needs within reach.   Therapy Documentation Precautions:  Precautions Precautions: Fall Precaution Comments: left side hemiparesis Restrictions Weight Bearing Restrictions: No   Therapy/Group: Individual Therapy  Breck Coons, PT, DPT  03/13/2020, 3:42 PM

## 2020-03-14 ENCOUNTER — Inpatient Hospital Stay (HOSPITAL_COMMUNITY): Payer: Medicaid Other | Admitting: Physical Therapy

## 2020-03-14 ENCOUNTER — Inpatient Hospital Stay (HOSPITAL_COMMUNITY): Payer: Medicaid Other | Admitting: Occupational Therapy

## 2020-03-14 NOTE — Progress Notes (Signed)
Occupational Therapy Session Note  Patient Details  Name: Joyce [name] MRN: [id number] Date of Birth: 05-13-1976  Today's Date: 03/14/2020 OT Individual Time: 1430-1515 OT Individual Time Calculation (min): 45 min    Short Term Goals: Week 1:  OT Short Term Goal 1 (Week 1): Pt will demonstrate improved hand grasp to hold a container in L hand to take the top off the container with her R hand. OT Short Term Goal 2 (Week 1): Pt will be able to tolerate standing in shower with close S for at least 5 min to demonstrate improved activity tolerance. OT Short Term Goal 3 (Week 1): Pt will demonstrate improved dynamic standing balance of pulling pants over hips with close S. OT Short Term Goal 4 (Week 1): Pt will complete toilet transfers with S using a RW.  Skilled Therapeutic Interventions/Progress Updates:    Pt received sitting EOB finishing a visit with a friend. Her friend left and pt needed to void.   She stood to RW and ambulated with Supervision BUT today she was demonstrating a significant amount of tremors on her R arm and leg (her non affected side).  Pt stated she had noticed this earlier today with PT and is not sure why.  She said she was very fatigued.  Had pt return to w/c after toileting.  Due to her fatigue, only used w/c to move to gym.    To work on general endurance and strength of BUE and focus on LUE, pt used arm bike for 20 min with 2 min of work followed by 2 min of rest cycles at no resistance. Pt moving arm crank bike very slowly.  Played music she likes to listen to.  Continued work with arm coordination with reaching small ball overhead and bringing down to alternating sides of thighs with straight arms, then torso rotations, then rotating ball for forearm supination/ pronation.  Pt taken back to room and she transferred to bed to be able to take a nap prior to her next therapy session.  Pt resting in bed with all needs met and alarm set.    Therapy  Documentation Precautions:  Precautions Precautions: Fall Precaution Comments: left side hemiparesis Restrictions Weight Bearing Restrictions: No  Pain: Pain Assessment Pain Score: 7  Pain Type: Acute pain Pain Location: Rib cage Pain Orientation: Left Pain Descriptors / Indicators: Aching Pain Onset: On-going ADL: ADL Eating: Independent Grooming: Independent Upper Body Bathing: Setup Where Assessed-Upper Body Bathing: Shower Lower Body Bathing: Supervision/safety Where Assessed-Lower Body Bathing: Shower Upper Body Dressing: Independent Where Assessed-Upper Body Dressing: Wheelchair Lower Body Dressing: Supervision/safety Where Assessed-Lower Body Dressing: Wheelchair Toileting: Supervision/safety Where Assessed-Toileting: Glass blower/designer: Close supervision Toilet Transfer Method: Arts development officer: Energy manager: Close supervision Social research officer, government Method: Stand pivot [employer]: Radio broadcast assistant, Grab bars   Therapy/Group: Individual Therapy  [place] 03/14/2020, 3:08 PM

## 2020-03-14 NOTE — Progress Notes (Signed)
Occupational Therapy Session Note  Patient Details  Name: Joyce [name] MRN: [id number] Date of Birth: [birth year]  Today's Date: 03/14/2020 OT Individual Time: 8309-4076 OT Individual Time Calculation (min): 58 min    Short Term Goals: Week 1:  OT Short Term Goal 1 (Week 1): Pt will demonstrate improved hand grasp to hold a container in L hand to take the top off the container with her R hand. OT Short Term Goal 2 (Week 1): Pt will be able to tolerate standing in shower with close S for at least 5 min to demonstrate improved activity tolerance. OT Short Term Goal 3 (Week 1): Pt will demonstrate improved dynamic standing balance of pulling pants over hips with close S. OT Short Term Goal 4 (Week 1): Pt will complete toilet transfers with S using a RW.  Skilled Therapeutic Interventions/Progress Updates:     Patient participated in session as follows:  Patient in w/c next to bed upon approach for OT therapy and stated she wanted to shower.   She partiicipated as follows:  She was able to maneuver w/c with Min A to gatherclothing from dresser drawers across the room.  Shower trasnfer:  Via rolling walker from next to bed to tub transfer bench= close S and extra time as patient walked slowly and took small, slow steps.     UB bathing and dressing= setup  Pt held body wash container in L hand (supported against her leg) to take the top off the container with her R hand.  LB bathing= min A (seated on tub transfer bench) She did not want to try standing in shower this session but rather preferred to complete lateral leans to complete peribathing  LB dressing= moderate assistance (to pull up pants over hips) with maintaining poor dynamic standing balance  She will benefit from more opportunities to maintain balance during lateral leans in order to more thorough wash and rinse periarea, and more opportunities for sit to stand, and functional dynamic standing and transfers in order to  increase balance, safety and independence with self.   She stated she has a 43 year old, a 44 year old, a 43 year old and other chlidren at home.  She transferred bk to bed (mod assist) to get legs onto bed and was left with call bell within reach.  Continue OT Plan of care   Therapy Documentation Precautions:  Precautions Precautions: Fall Precaution Comments: left side hemiparesis Restrictions Weight Bearing Restrictions: No      Therapy/Group: Individual Therapy  Alfredia Ferguson [facility] 03/14/2020, 1:02 PM

## 2020-03-14 NOTE — Progress Notes (Signed)
Physical Therapy Session Note  Patient Details  Name: Joyce [name] MRN: [id number] Date of Birth: [birth year]  Today's Date: 03/14/2020 PT Individual Time: 0810-0850 and 1710-1740 PT Individual Time Calculation (min): 40 min and 30 min    Short Term Goals: Week 1:  PT Short Term Goal 1 (Week 1): Pt will perform bed mobility with supervision. PT Short Term Goal 2 (Week 1): Pt will perform bed to chair transfer with CGA. PT Short Term Goal 3 (Week 1): Pt will ambulate x50' with minA and no AD. PT Short Term Goal 4 (Week 1): Pt will complete 4 steps with LHR and minA.  Skilled Therapeutic Interventions/Progress Updates:  Session 1   Pt received sitting on toilet and agreeable to PT. Pt performed peri care sitting at toilet without assist. Ambulatory transfer to [facility] with RW and min assist. Pt then performed oral and facial hygiene at sink with set up assist only while RN present to administer medication.   Pt transported to rehab gym in [facility]. Gait training with RW x 625f with min assist and min cues for improved step length on the R and step height on the L. Pt performed foot tap on 6 inch step x 4 BLE with ony 25% success on the L. Stepping over/back hockey stick on floor x 5 BLE with min assist and cues for hip knee flexion. Dynamic gait training training, forward/reverse x 570fwith min assist and cues for symmerty in gait pattern.  [facility] returned to [facility] and [facility] and all needs met.     Session 2  Pt received supine in bed, asleep. Pt unable to be aroused. PT returned in 30 min and pt aroused with ease, and agreeable to PT. Supine>sit transfer witout assist or cues. Pt reports need for toiletting. Ambulatory transfer to toilet with CGA from PT for safety to toilet. Pt able to void and performed pericare without assist.   [facility] as listed above. Standing NMR with RW: foot tap on target x 8 forward and lateral BLE,  tandem stance 2x 10 sec hold BLE. Forward/reverse gait x 25f325fith CGA. Pt returned to room and performed stand pivot transfer to bed with CGA and RW. Sit>supine completed with min assist on the LLLE and left supine in bed with call bell in reach and all needs met.        Therapy Documentation Precautions:  Precautions Precautions: Fall Precaution Comments: left side hemiparesis Restrictions Weight Bearing Restrictions: No General: PT Amount of Missed Time (min): 30 Minutes PT Missed Treatment Reason: Patient fatigue Vital Signs: Therapy Vitals Pulse Rate: 92 BP: (!) 145/77 Pain:   denies  Therapy/Group: Individual Therapy  AusLorie Phenix/16/2021, 9:50 AM

## 2020-03-15 MED ORDER — SORBITOL 70 % SOLN
30.0000 mL | Freq: Every day | Status: DC | PRN
  Administered 2020-03-15: 30 mL via ORAL
  Filled 2020-03-15: qty 30

## 2020-03-15 NOTE — Progress Notes (Signed)
[place] PHYSICAL MEDICINE & REHABILITATION PROGRESS NOTE   Subjective/Complaints:  Constipated but denies abd pain, HA pain improved , oral and facial pain improved   ROS: +numbness in fingers, pain improved  Objective:   No results found. No results for input(s): WBC, HGB, HCT, PLT in the last 72 hours. No results for input(s): NA, K, CL, CO2, GLUCOSE, BUN, CREATININE, CALCIUM in the last 72 hours. No intake or output data in the 24 hours ending 03/15/20 0815      Physical Exam: Vital Signs Blood pressure 133/74, pulse 65, temperature 97.6 F (36.4 C), temperature source Oral, resp. rate 16, height 5\' 6"  (1.676 m), weight 103.3 kg, SpO2 100 %.  General: No acute distress Mood and affect are appropriate Heart: Regular rate and rhythm no rubs murmurs or extra sounds Lungs: Clear to auscultation, breathing unlabored, no rales or wheezes Abdomen: Positive bowel sounds, soft nontender to palpation, nondistended Extremities: No clubbing, cyanosis, or edema  LUE- Deltoid/biceps 4-/5, Triceps/WE 4/5, grip 4/5, finger abd 4/5 RLE- HF, KE, DF and PF 5/5 LLE_ HF 3-/5, KE 2+/5, DF 3+/5, PF 4-/5   Skin:    Comments: No skin breakdown seen Has a large callus on R first MTP/bottom of foot Pimple forming on nose  Neurological:     Comments: Patient is alert in no acute distress.  Speech is fluent.  Oriented x3 and follows commands. Decreased sensation to light touch on L side- LUE <LLE Also numb in V2/V3 on L side of face- not V1  Psychiatric:     Comments: Slightly anxious       Assessment/Plan: 1. Functional deficits secondary to left hemiparesis which require 3+ hours per day of interdisciplinary therapy in a comprehensive inpatient rehab setting.  Physiatrist is providing close team supervision and 24 hour management of active medical problems listed below.  Physiatrist and rehab team continue to assess barriers to discharge/monitor patient progress toward functional  and medical goals  Care Tool:  Bathing    Body parts bathed by patient: Right arm, Left arm, Chest, Abdomen, Front perineal area, Buttocks, Right upper leg, Left upper leg, Right lower leg, Left lower leg, Face         Bathing assist Assist Level: Supervision/Verbal cueing     Upper Body Dressing/Undressing Upper body dressing   What is the patient wearing?: Pull over shirt    Upper body assist Assist Level: Independent    Lower Body Dressing/Undressing Lower body dressing      What is the patient wearing?: Pants, Underwear/pull up     Lower body assist Assist for lower body dressing: Supervision/Verbal cueing     Toileting Toileting    Toileting assist Assist for toileting: Supervision/Verbal cueing     Transfers Chair/bed transfer  Transfers assist     Chair/bed transfer assist level: Contact Guard/Touching assist     Locomotion Ambulation   Ambulation assist      Assist level: Total Assistance - Patient < 25% Assistive device: Lite Gait Max distance: 374'   Walk 10 feet activity   Assist     Assist level: Minimal Assistance - Patient > 75% Assistive device: No Device   Walk 50 feet activity   Assist Walk 50 feet with 2 turns activity did not occur: Safety/medical concerns  Assist level: Minimal Assistance - Patient > 75% Assistive device: No Device    Walk 150 feet activity   Assist Walk 150 feet activity did not occur: Safety/medical concerns  Assist level: Minimal  Assistance - Patient > 75% Assistive device: No Device    Walk 10 feet on uneven surface  activity   Assist     Assist level: Minimal Assistance - Patient > 75% Assistive device: Aeronautical engineer Will patient use wheelchair at discharge?: No             Wheelchair 50 feet with 2 turns activity    Assist            Wheelchair 150 feet activity     Assist          Blood pressure 133/74, pulse 65,  temperature 97.6 F (36.4 C), temperature source Oral, resp. rate 16, height 5\' 6"  (1.676 m), weight 103.3 kg, SpO2 100 %.    Medical Problem List and Plan: 1.  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  Antithrombotics: -DVT/anticoagulation: SCDs             -antiplatelet therapy: Aspirin 305 mg daily and Plavix 75 mg daily x1 month then aspirin alone 3. Pain Management: Lidoderm patch, oxycodone as needed- has migraines'hx of panic attacks. 10/14: gabapentin 300 TID added for trigeminal neuralgia- . 10/15: the latter has greatly helped w/o side effects.   4. Mood: Provide emotional support             -antipsychotic agents: N/A 5. Neuropsych: This patient is capable of making decisions on her own behalf. Would benefit from neuropsych eval.  6. Skin/Wound Care: Routine skin checks. May d/c IV.  7. Fluids/Electrolytes/Nutrition: Routine in and outs with follow-up chemistries 8.  Migraine headaches.  Topamax 25 mg daily- could use lidocaine patches on neck for HA's.  9.  Hyperlipidemia.  Lipitor 10. Hypertensive emergency: BP went up to 220s/1teens on admision- improved with 1x dose of IV labetalol.  10/15:  BP labile- continue to monitor. Her home amlodipine dose was restarted.  11. Chest pain on 10/12: EKG was obtained and showed nonspecific ST elevation. Nitroglycerin and oxygen were administered. Episode resolved. She does have some chest pressure while working with therapy this morning; it is nontender to palpation. Continue to monitor closely.   10/15: discussed meditation to help relieve anxiety and panic attacks and she is agreeable to trying 12. Low albumin: supplement initiated 10/13.  13. Numbness in inside of mouth- has been biting herself. There is numbness throughout.   LOS: 5 days A FACE TO FACE EVALUATION WAS PERFORMED  Charlett Blake 03/15/2020,  8:15 AM

## 2020-03-16 ENCOUNTER — Inpatient Hospital Stay (HOSPITAL_COMMUNITY): Payer: Medicaid Other | Admitting: Occupational Therapy

## 2020-03-16 ENCOUNTER — Inpatient Hospital Stay (HOSPITAL_COMMUNITY): Payer: Medicaid Other

## 2020-03-16 ENCOUNTER — Encounter (HOSPITAL_COMMUNITY): Payer: Medicaid Other | Admitting: Psychology

## 2020-03-16 DIAGNOSIS — I10 Essential (primary) hypertension: Secondary | ICD-10-CM

## 2020-03-16 DIAGNOSIS — F411 Generalized anxiety disorder: Secondary | ICD-10-CM

## 2020-03-16 DIAGNOSIS — I639 Cerebral infarction, unspecified: Secondary | ICD-10-CM

## 2020-03-16 NOTE — Progress Notes (Signed)
[place] PHYSICAL MEDICINE & REHABILITATION PROGRESS NOTE   Subjective/Complaints:  Tired from therapy on Saturday. Otherwise did quite well. Slept well last night  ROS: Patient denies fever, rash, sore throat, blurred vision, nausea, vomiting, diarrhea, cough, shortness of breath or chest pain, joint or back pain, headache, or mood change.    Objective:   No results found. No results for input(s): WBC, HGB, HCT, PLT in the last 72 hours. No results for input(s): NA, K, CL, CO2, GLUCOSE, BUN, CREATININE, CALCIUM in the last 72 hours.  Intake/Output Summary (Last 24 hours) at 03/16/2020 1256 Last data filed at 03/16/2020 0909 Gross per 24 hour  Intake 40 ml  Output --  Net 40 ml        Physical Exam: Vital Signs Blood pressure 133/83, pulse 84, temperature 98.3 F (36.8 C), resp. rate 16, height 5\' 6"  (1.676 m), weight 103.3 kg, SpO2 100 %.  Constitutional: No distress . Vital signs reviewed. HEENT: EOMI, oral membranes moist Neck: supple Cardiovascular: RRR without murmur. No JVD    Respiratory/Chest: CTA Bilaterally without wheezes or rales. Normal effort    GI/Abdomen: BS +, non-tender, non-distended Ext: no clubbing, cyanosis, or edema Psych: pleasant and cooperative Neuro: normal language, speech. normal CN exam. Reasonable insight and awareness. LUE- Deltoid/biceps 2+ to 3-/5, Triceps/WE 3/5, grip 4-/5, finger abd 4-/5 RLE- HF, KE, DF and PF 5/5 LLE_ HF 2+ to 3-/5, KE 2+/5, DF 3+/5, PF 3+/5 Decreased sensation left finger tips and toes. Left facial numbness Skin:    Comments: No skin breakdown seen Has a large callus on R first MTP/bottom of foot       Assessment/Plan: 1. Functional deficits secondary to left hemiparesis which require 3+ hours per day of interdisciplinary therapy in a comprehensive inpatient rehab setting.  Physiatrist is providing close team supervision and 24 hour management of active medical problems listed below.  Physiatrist and  rehab team continue to assess barriers to discharge/monitor patient progress toward functional and medical goals  Care Tool:  Bathing    Body parts bathed by patient: Right arm, Left arm, Chest, Abdomen, Front perineal area, Buttocks, Right upper leg, Left upper leg, Right lower leg, Left lower leg, Face         Bathing assist Assist Level: Supervision/Verbal cueing     Upper Body Dressing/Undressing Upper body dressing   What is the patient wearing?: Pull over shirt    Upper body assist Assist Level: Independent    Lower Body Dressing/Undressing Lower body dressing      What is the patient wearing?: Pants, Underwear/pull up     Lower body assist Assist for lower body dressing: Supervision/Verbal cueing     Toileting Toileting    Toileting assist Assist for toileting: Supervision/Verbal cueing     Transfers Chair/bed transfer  Transfers assist     Chair/bed transfer assist level: Contact Guard/Touching assist     Locomotion Ambulation   Ambulation assist      Assist level: Total Assistance - Patient < 25% Assistive device: Lite Gait Max distance: 374'   Walk 10 feet activity   Assist     Assist level: Minimal Assistance - Patient > 75% Assistive device: No Device   Walk 50 feet activity   Assist Walk 50 feet with 2 turns activity did not occur: Safety/medical concerns  Assist level: Minimal Assistance - Patient > 75% Assistive device: No Device    Walk 150 feet activity   Assist Walk 150 feet activity did not  occur: Safety/medical concerns  Assist level: Minimal Assistance - Patient > 75% Assistive device: No Device    Walk 10 feet on uneven surface  activity   Assist     Assist level: Minimal Assistance - Patient > 75% Assistive device: Aeronautical engineer Will patient use wheelchair at discharge?: No             Wheelchair 50 feet with 2 turns activity    Assist             Wheelchair 150 feet activity     Assist          Blood pressure 133/83, pulse 84, temperature 98.3 F (36.8 C), resp. rate 16, height 5\' 6"  (1.676 m), weight 103.3 kg, SpO2 100 %.    Medical Problem List and Plan: 1.  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  Antithrombotics: -DVT/anticoagulation: SCDs             -antiplatelet therapy: Aspirin 305 mg daily and Plavix 75 mg daily x1 month then aspirin alone 3. Pain Management: Lidoderm patch, oxycodone as needed- has migraines'hx of panic attacks.   -10/14: gabapentin 300 TID added for trigeminal neuralgia.  - 10/18: the latter has greatly helped w/o side effects.   4. Mood: Provide emotional support             -antipsychotic agents: N/A 5. Neuropsych: This patient is capable of making decisions on her own behalf. Would benefit from neuropsych eval.  6. Skin/Wound Care: Routine skin checks. May d/c IV.  7. Fluids/Electrolytes/Nutrition: Routine in and outs with follow-up chemistries 8.  Migraine headaches.  Topamax 25 mg daily-   9.  Hyperlipidemia.  Lipitor 10. Hypertensive emergency: BP went up to 220s/1teens on admision- improved with 1x dose of IV labetalol.  10/15:  BP labile- continue to monitor. Her home amlodipine dose was restarted.   10/18 improved control 11. Chest pain on 10/12: EKG was obtained and showed nonspecific ST elevation. Nitroglycerin and oxygen were administered. Episode resolved. She does have some chest pressure while working with therapy this morning; it is nontender to palpation. Continue to monitor closely.   10/15: discussed meditation to help relieve anxiety and panic attacks   10/18 appears more relaxed today 12. Low albumin: supplement initiated 10/13.      LOS: 6 days A FACE TO FACE EVALUATION WAS PERFORMED  Joyce [name] 03/16/2020, 12:56 PM

## 2020-03-16 NOTE — Consult Note (Signed)
Neuropsychological Consultation   Patient:   Joyce [name]   DOB:   1976/05/12  MR Number:  [id number]  Location:  [facility] [id number] [place] [address] Dept: [place]: [phone]           Date of Service:   03/16/2020  Start Time:   9 AM End Time:   10 AM  Provider/Observer:  Ilean Skill, Psy.D.       Clinical Neuropsychologist       Billing Code/Service: [id number]  Chief Complaint:    Joyce [name] is a 44 year old female with prior history of hypertension, depression, PTSD and migraine headaches.  The patient has been followed by Dr. Modesta Messing with outpatient psychiatry in [facility].  Patient has been maintained on Lexapro and trazodone for sleep at night by Dr. Modesta Messing and she continues to take these medications while in the hospital.  Patient presented to [facility] on 02/29/2020 with left-sided weakness.  Patient denied chest pain, shortness of breath, nausea or vomiting.  Cranial CT scan showed no acute changes.  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  Patient being maintained on prophylactic medicines.  Patient was evaluated and admitted to the comprehensive rehabilitation program for residual effects following her CVA.  Patient continues to have numbness and weakness on her left side with particularly distressing tingling and other paresthesias he is in her right hand.  Reason for Service:  Patient was referred for neuropsychological consultation due to coping and adjustment in the setting of prior history of major depressive disorder.  Below is the HPI for the current admission.  HPI: Joyce [name] is a 44 year old right-handed female with documented history of hypertension, depression and migraine headaches.  Per chart review patient lives with her spouse and children.  Independent prior to admission.   Two-level home bed and bath upstairs.  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  Presented to [facility] 03/04/2020 left-sided weakness.  She denied any chest pain shortness of breath nausea or vomiting.  Cranial CT scan showed no acute changes.  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  Carotid Dopplers with no ICA stenosis.  MRA of the head with no proximal intracranial vessel occlusion or significant stenosis.  Admission chemistries unremarkable except glucose 114, alcohol negative, hemoglobin 15.4, WBC 12,200.  Echocardiogram with ejection fraction of 60 to 65% no wall motion abnormalities.  Presently maintained on aspirin and Plavix for CVA prophylaxis x1 month followed by aspirin alone.  She continues on Topamax for history of headaches.  Tolerating a regular diet.  Therapy evaluations completed and patient was admitted for a comprehensive rehab program.  Current Status:  Patient reports that initially she did have a significant anxiety/depressive response after her CVA.  The patient reports that she is now coming to grips with the residual effects of her thalamic infarct and is maintaining motivation with therapeutic interventions.  The patient reports that there are times when she has been very fatigued and tired after therapies but is motivated to make functional gains.  The patient reports that she is coping with the extended hospital stay and expects to be able to make progressive improvements over the next 2 weeks.  Patient denies any recurrence of her PTSD symptoms and denies any current flashbacks or nightmares.  Behavioral Observation: Joyce [name]  presents as a 44 y.o.-year-old Right African American  Female who appeared her stated age. her dress was Appropriate and she was Well Groomed and her manners were Appropriate to the situation.  her participation was indicative of Appropriate and Attentive behaviors.  There were any physical disabilities noted.  she  displayed an appropriate level of cooperation and motivation.     Interactions:    Active Appropriate  Attention:   within normal limits and attention span and concentration were age appropriate  Memory:   within normal limits; recent and remote memory intact  Visuo-spatial:  not examined  Speech (Volume):  normal  Speech:   normal; normal  Thought Process:  Coherent and Relevant  Though Content:  WNL; not suicidal and not homicidal  Orientation:   person, place, time/date and situation  Judgment:   Good  Planning:   Good  Affect:    Appropriate  Mood:    Dysphoric  Insight:   Good  Intelligence:   normal   Medical History:   Past Medical History:  Diagnosis Date  . Acute CVA (cerebrovascular accident) ([place]) 03/04/2020  . AMA (advanced maternal age) multigravida 35+ 11/12/2014  . Anemia   . Depression   . Hypertension   . Migraine   . Pregnant 11/12/2014  . PTSD (post-traumatic stress disorder) 05/01/2017  . Round ligament pain 11/12/2014  . Short of breath on exertion 11/12/2014  . 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  She has had a lot of stress at various times and discord with relationships previously.  She has been followed by Dr. Modesta Messing for psychiatric care and also saw Maurice Small, MSW for psychotherapeutic interventions in [place].  Family Med/Psych History:  Family History  Problem Relation Age of Onset  . Diabetes Mother   . Hypertension Mother   . Kidney disease Mother   . Asthma Mother   . Hyperlipidemia Mother   . Alzheimer's disease Father   . Diabetes Father   . Kidney disease Sister   . Asthma Son   . Diabetes Sister   . Asthma Son   . ADD / ADHD Son   . Heart murmur Son   . Asthma Son     Impression/DX:  Joyce [name] is a 44 year old female with prior history of hypertension, depression, PTSD and  migraine headaches.  The patient has been followed by Dr. Modesta Messing with outpatient psychiatry in [facility].  Patient has been maintained on Lexapro and trazodone for sleep at night by Dr. Modesta Messing and she continues to take these medications while in the hospital.  Patient presented to [facility] on 02/29/2020 with left-sided weakness.  Patient denied chest pain, shortness of breath, nausea or vomiting.  Cranial CT scan showed no acute changes.  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  Patient being maintained on prophylactic medicines.  Patient was evaluated and admitted to the comprehensive rehabilitation program for residual effects following her CVA.  Patient continues to have numbness and weakness on her left side with particularly distressing tingling and other paresthesias he is in her right hand.  Patient reports that initially she did have a significant anxiety/depressive response after her CVA.  The patient reports that she is now coming to grips with the residual effects of her thalamic infarct and is maintaining motivation with therapeutic interventions.  The patient reports that there are times when she  has been very fatigued and tired after therapies but is motivated to make functional gains.  The patient reports that she is coping with the extended hospital stay and expects to be able to make progressive improvements over the next 2 weeks.  Patient denies any recurrence of her PTSD symptoms and denies any current flashbacks or nightmares.  Disposition/Plan:  Today we worked on coping and adjustment issues around her recent CVA in the setting of a history of prior major depressive disorder and anxiety as well as chronic PTSD symptoms.  Diagnosis:    Right thalamic infarction [facility]) - Plan: Ambulatory referral to Neurology  MDD (major depressive disorder), recurrent episode, moderate ([place]), Chronic         Electronically  Signed   _______________________ Ilean Skill, Psy.D.

## 2020-03-16 NOTE — Progress Notes (Signed)
Physical Therapy Session Note  Patient Details  Name: Joyce [name] MRN: [id number] Date of Birth: Apr 25, 1976  Today's Date: 03/16/2020 PT Individual Time: 810-175 1025-8527 PT Individual Time Calculation (min): 59 min and 73 min   Short Term Goals: Week 1:  PT Short Term Goal 1 (Week 1): Pt will perform bed mobility with supervision. PT Short Term Goal 2 (Week 1): Pt will perform bed to chair transfer with CGA. PT Short Term Goal 3 (Week 1): Pt will ambulate x50' with minA and no AD. PT Short Term Goal 4 (Week 1): Pt will complete 4 steps with LHR and minA.  Skilled Therapeutic Interventions/Progress Updates:     1st Session: Pt received seated on toilet and agrees to therapy. PT provides setup assist and pt performs pericare without assistance. Stand step transfer to [facility] with RW and close supervision. Pt reports pain in L flank but less than previous sessions. Feels like pain is muscular in nature, per patient. Rest breaks provided to manage pain.  WC transport to gym for time management. Pt performs x8 3 inch steps with LHR and minA. Following extended seated rest break pt performs x4 6 inch steps with minA.  Pt completes biodex NMR for standing balance, mapping pressure distribution between RLE and LLE. Pt initially placing 75% weight through RLE and 25% through LLE. With verbal and tactile cues pt is able to slow equalize weight distribution, using BUE for support. Pt then able to remove UE support and maintain equal WB between BLEs.   Pt left seated in WC with alarm intact and all needs within reach. NT present.  2nd Session: Pt received supine in bed and agrees to therapy. No complaint of pain. Supine to sit mod(I) with bed features. Stand pivot transfer to [facility] with close supervision and increased time.   Pt ambulates 150' with minA and no AD. Pt ambulates very slowly and presents with tremors in BLEs with ambulation, appearing slightly ataxic. Gait pattern improves with  increased speed but pt is unable to significantly increase gait speed. Pt also very tachypneic and appears anxious with ambulation.  Pt performs additional gait training with litegait overground. Initially pt uses BUEs for support and progresses to just LUE support and then no UE support. >500' total. Left toe frequently contacts ground during swing phase.  Pt ambulates additional 150' without litegait to test for carryover. Gait speed increased from earlier bout and pt does not present with as many tremors. PT provides minA/CGA primarily for pt comfort and to facilitate lateral weight shifting.  Pt performs stand step transfer from [facility] to bed with CGA. Sit to supine with supervision and increased time. Left supine in bed with alarm intact and all needs within reach.  Therapy Documentation Precautions:  Precautions Precautions: Fall Precaution Comments: left side hemiparesis Restrictions Weight Bearing Restrictions: No    Therapy/Group: Individual Therapy  Breck Coons, PT, DPT 03/16/2020, 3:51 PM

## 2020-03-16 NOTE — Progress Notes (Signed)
Occupational Therapy Session Note  Patient Details  Name: Joyce [name] MRN: [id number] Date of Birth: [birth year]  Today's Date: 03/16/2020 OT Individual Time: 1015-1130 OT Individual Time Calculation (min): 75 min    Short Term Goals: Week 1:  OT Short Term Goal 1 (Week 1): Pt will demonstrate improved hand grasp to hold a container in L hand to take the top off the container with her R hand. OT Short Term Goal 2 (Week 1): Pt will be able to tolerate standing in shower with close S for at least 5 min to demonstrate improved activity tolerance. OT Short Term Goal 3 (Week 1): Pt will demonstrate improved dynamic standing balance of pulling pants over hips with close S. OT Short Term Goal 4 (Week 1): Pt will complete toilet transfers with S using a RW.  Skilled Therapeutic Interventions/Progress Updates:    Patient seated in w/c, alert and ready for therapy session.  She denies pain at this time and requests to take a shower this am.  She is able to gather clothing w/c level with min A.  Ambulation with RW to/from w/c, toilet, shower bench, arm chair with CGA.     toileting completed with CGA, shower completed with CS, dressing completed seated on arm chair - set up for bra and OH shirt, CGA for underwear/pants, dependent for teds, set up for slip on shoes.  Grooming tasks w/c level with set up.   Completed UB conditioning and coordination activities in therapy gym:  dynavision in stance (2 minutes whole screen)  Trial 1:  Right UE = 1.21 sec,   Trial 2:  Left UE = 3.33 sec. UB ergometer forward 4 minutes, backward 2 minutes with focus on posture and left UE control. Shoulder extension and abd with forward lean in stance with good tolerance.  She returned to bed at close of session with min A to manage bilateral LEs into bed.  Bed alarm set and call bell in reach.    Therapy Documentation Precautions:  Precautions Precautions: Fall Precaution Comments: left side  hemiparesis Restrictions Weight Bearing Restrictions: No   Therapy/Group: Individual Therapy  Carlos Levering 03/16/2020, 7:35 AM

## 2020-03-17 ENCOUNTER — Inpatient Hospital Stay (HOSPITAL_COMMUNITY): Payer: Medicaid Other | Admitting: Occupational Therapy

## 2020-03-17 ENCOUNTER — Inpatient Hospital Stay (HOSPITAL_COMMUNITY): Payer: Medicaid Other

## 2020-03-17 NOTE — Patient Care Conference (Signed)
Inpatient RehabilitationTeam Conference and Plan of Care Update Date: 03/17/2020   Time: 10:39 AM    Patient Name: Joyce [name]      Medical Record Number: [id number]  Date of Birth: April 14, 1976 Sex: Female         Room/Bed: 2Z30Q/6V78I-69 Payor Info: Payor: /    Admit Date/Time:  03/10/2020 12:14 PM  Primary Diagnosis:  Left hemiparesis [facility])  Hospital Problems: Principal Problem:   Left hemiparesis [facility]) Active Problems:   Right thalamic infarction [facility])    Expected Discharge Date: Expected Discharge Date: 03/24/20  Team Members Present: Physician leading conference: Dr. Alger Simons Care Coodinator Present: Loralee Pacas, LCSWA;Levoy Geisen Creig Hines, RN, BSN, [place] Nurse Present: Suella Grove, RN PT Present: Tereasa Coop, PT OT Present: Meriel Pica, OT PPS Coordinator present : Ileana Ladd, Burna Mortimer, SLP     Current Status/Progress Goal Weekly Team Focus  Bowel/Bladder   continent B/B LBM 03/16/2020  remain continent  prevent constipation r/t pain meds   Swallow/Nutrition/ Hydration             ADL's   UB adl set up, LB adl CGA/CS, functional transfers CGA  CS/set up  adl training, HM training, left NMRE, patient/family education   Mobility   supervision bed mobility, CGA transfers, minA ambulation ~150' without AD, minA 4 6" steps with LHR. Pt very anxious with ambulation. Presents with tremors in BLEs  Supervision  Balance, LLE NMR, Gait without AD, Stairs   Communication             Safety/Cognition/ Behavioral Observations            Pain   con't c/o left side/rid area pain 8/10, receives oxycodone prn  2/10  assess qshift and prn. notify MD if pain med ineffective   Skin   intact           Discharge Planning:  D/c to home with support from s/o, adult children, and mother in law.   Team Discussion: Continent B/B, left hand numbness. OT reports mod I goals, currently at supervision level. PT reports supervision now and min assist with  ambulation. Patient presents with BLE tremors Patient on target to meet rehab goals: yes  *See Care Plan and progress notes for long and short-term goals.   Revisions to Treatment Plan:  None at this time.  Teaching Needs: Continue with family education.  Current Barriers to Discharge: Medical stability and pain.  Possible Resolutions to Barriers: MD to continue with medication adjustments, and continue to treat pain.     Medical Summary Current Status: right thalamic infarct with left hemiparesis and hemisensory deficits. pain improved. bp control reasonable at present  Barriers to Discharge: Medical stability   Possible Resolutions to [employer] Focus: treating pain, adjusting meds, daily review of labs/VS/flow sheets   Continued Need for Acute Rehabilitation Level of Care: The patient requires daily medical management by a physician with specialized training in physical medicine and rehabilitation for the following reasons: Direction of a multidisciplinary physical rehabilitation program to maximize functional independence : Yes Medical management of patient stability for increased activity during participation in an intensive rehabilitation regime.: Yes Analysis of laboratory values and/or radiology reports with any subsequent need for medication adjustment and/or medical intervention. : Yes   I attest that I was present, lead the team conference, and concur with the assessment and plan of the team.   Cristi Loron 03/17/2020, 2:49 PM

## 2020-03-17 NOTE — Progress Notes (Signed)
Occupational Therapy Session Note  Patient Details  Name: Joyce [name] MRN: [id number] Date of Birth: 1976-01-07  Today's Date: 03/17/2020 OT Individual Time: 1100-1200 OT Individual Time Calculation (min): 60 min    Short Term Goals: Week 1:  OT Short Term Goal 1 (Week 1): Pt will demonstrate improved hand grasp to hold a container in L hand to take the top off the container with her R hand. OT Short Term Goal 2 (Week 1): Pt will be able to tolerate standing in shower with close S for at least 5 min to demonstrate improved activity tolerance. OT Short Term Goal 3 (Week 1): Pt will demonstrate improved dynamic standing balance of pulling pants over hips with close S. OT Short Term Goal 4 (Week 1): Pt will complete toilet transfers with S using a RW.  Skilled Therapeutic Interventions/Progress Updates:    Patient in bed, alert and ready for session, she notes left LE pain earlier today but in control at this time due to pain meds.  She requests a shower to start this am.  Supine to sitting edge of bed with DS.  Ambulation in room to/from bed, commode, shower bench, arm chair and w/c with CGA/CS improved rate noted.  She requires min A to gather clothing in stance with RW.  toileting completed with CS, bathing set up/CS seated on shower bench, UB and LB dressing set up/CS seated arm chair (max A teds only), grooming tasks mod I w/c level.   Completed dynavision activity in stance 2 minutes, whole screen - trial 1:  Right UE = 1.35 sec, trial 2:  Left UE = 2.35 sec.   Completed gross motor UB activity with good tolerance, FMC/dexterity activity with good results left hand.  She returned to bed at close of session with min A.  Bed alarm set and callbell in hand.    Therapy Documentation Precautions:  Precautions Precautions: Fall Precaution Comments: left side hemiparesis Restrictions Weight Bearing Restrictions: No   Therapy/Group: Individual Therapy  Carlos Levering 03/17/2020,  7:42 AM

## 2020-03-17 NOTE — Progress Notes (Signed)
Physical Therapy Weekly Progress Note  Patient Details  Name: Joyce [name] MRN: [id number] Date of Birth: Mar 31, 1976  Beginning of progress report period: March 11, 2020 End of progress report period: March 17, 2020  Today's Date: 03/17/2020 PT Individual Time: 8338-2505 and 3976-7341 PT Individual Time Calculation (min):70 min and 57 min  Patient has met 4 of 4 short term goals.  Pt is progressing well toward PT goals, improving independence with bed mobility, functional transfers, and ambulation. Pt has increased strength and motor controls with LLE. Pt performing bed mobility at supervision level, transfers at supervision to CGA, depending on fatigue, and ambulation ~150' without AD and with minA. Pt requires minA for balance in standing and tends to fatigue quickly, demonstrating tremors in BLEs and tachypnea. Focus of coming week to be improved balance, NMR of LLE, ambulation, and DC prep.  Patient continues to demonstrate the following deficits muscle weakness, decreased cardiorespiratoy endurance, decreased motor planning and decreased standing balance, hemiplegia and decreased balance strategies and therefore will continue to benefit from skilled PT intervention to increase functional independence with mobility.  Patient progressing toward long term goals..  Continue plan of care.  PT Short Term Goals Week 1:  PT Short Term Goal 1 (Week 1): Pt will perform bed mobility with supervision. PT Short Term Goal 1 - Progress (Week 1): Met PT Short Term Goal 2 (Week 1): Pt will perform bed to chair transfer with CGA. PT Short Term Goal 2 - Progress (Week 1): Met PT Short Term Goal 3 (Week 1): Pt will ambulate x50' with minA and no AD. PT Short Term Goal 3 - Progress (Week 1): Met PT Short Term Goal 4 (Week 1): Pt will complete 4 steps with LHR and minA. PT Short Term Goal 4 - Progress (Week 1): Met Week 2:  PT Short Term Goal 1 (Week 2): STGs = LTGs due to ELOS  Skilled  Therapeutic Interventions/Progress Updates:  Ambulation/gait training;Community reintegration;DME/adaptive equipment instruction;Neuromuscular re-education;Psychosocial support;Stair training;UE/LE Strength taining/ROM;Balance/vestibular training;Discharge planning;Functional electrical stimulation;Pain management;Skin care/wound management;Therapeutic Activities;UE/LE Coordination activities;Cognitive remediation/compensation;Disease management/prevention;Functional mobility training;Patient/family education;Splinting/orthotics;Therapeutic Exercise;Visual/perceptual remediation/compensation   1st Session: Pt received supine in bed and agrees to therapy. No complaint of pain> Supine to sit with supervision. SPT to [facility] with close supervision and increased time.   Pt performs NMR for standing balance and LLE stance control. Pt stands with mirror positioned for visual feedback. PT provides cues for pt to tap RLE on cones and pt performs SLS on LLE to complete task. PT provides CGA/minA and light blocking of L knee, though no buckling noted. Pt does not have any overt LOBs. Progression of activity includes tapping toe to multiple cones in sequence. Pt consistently performs up to 2 taps correctly. With 3 or 4 in sequence pt has difficulty with order and only performs correctly x50% of time.  Pt performs BERG balance assessment, as detailed below. Extended seated rest breaks throughout activity due to fatigue.  Pt left supine in bed with alarm intact and all needs within reach.   2nd Session: Pt received supine in bed and agrees to therapy. No complaint of pain.  WC transport outside for time mangeement. Pt ambulates 180' with CGA and no AD. Ambulates 18' with close supervision and no AD. Appears unsteady and takes tentative steps with BLEs, but no overt LOBs. Pt ambulates outside in open environment with varying surfaces for increased challenge.  Pt performs x3 steps with 6 inch step in // bars, with use  of LHR  for support. Pt then steps all the way over step with LLE leading, x2 reps. Pt tremulous throughout activity but does not buckle in LLE or have any LOBs.  PT encourages pt to sit up in [facility] following therapy but pt requests to return to bed. Left supine in bed with alarm intact and all needs within reach.  Therapy Documentation Precautions:  Precautions Precautions: Fall Precaution Comments: left side hemiparesis Restrictions Weight Bearing Restrictions: No  Balance: Standardized Balance Assessment Standardized Balance Assessment: Berg Balance Test Berg Balance Test Sit to Stand: Able to stand  independently using hands Standing Unsupported: Able to stand 2 minutes with supervision Sitting with Back Unsupported but Feet Supported on Floor or Stool: Able to sit safely and securely 2 minutes Stand to Sit: Controls descent by using hands Transfers: Able to transfer safely, definite need of hands Standing Unsupported with Eyes Closed: Able to stand 10 seconds safely Standing Ubsupported with Feet Together: Needs help to attain position but able to stand for 30 seconds with feet together From Standing, Reach Forward with Outstretched Arm: Reaches forward but needs supervision From Standing Position, Pick up Object from Floor: Able to pick up shoe, needs supervision From Standing Position, Turn to Look Behind Over each Shoulder: Turn sideways only but maintains balance Turn 360 Degrees: Needs close supervision or verbal cueing Standing Unsupported, Alternately Place Feet on Step/Stool: Needs assistance to keep from falling or unable to try Standing Unsupported, One Foot in Front: Able to plae foot ahead of the other independently and hold 30 seconds Standing on One Leg: Able to lift leg independently and hold 5-10 seconds Total Score: 34  Therapy/Group: Individual Therapy  Breck Coons, PT, DPT 03/17/2020, 3:33 PM

## 2020-03-17 NOTE — Progress Notes (Signed)
[place] PHYSICAL MEDICINE & REHABILITATION PROGRESS NOTE   Subjective/Complaints:  Had a reasonable night. Hand/foot numb this morning  ROS: Patient denies fever, rash, sore throat, blurred vision, nausea, vomiting, diarrhea, cough, shortness of breath or chest pain, joint or back pain, headache, or mood change.     Objective:   No results found. No results for input(s): WBC, HGB, HCT, PLT in the last 72 hours. No results for input(s): NA, K, CL, CO2, GLUCOSE, BUN, CREATININE, CALCIUM in the last 72 hours.  Intake/Output Summary (Last 24 hours) at 03/17/2020 1042 Last data filed at 03/17/2020 0900 Gross per 24 hour  Intake 340 ml  Output --  Net 340 ml        Physical Exam: Vital Signs Blood pressure 129/77, pulse 80, temperature 98.2 F (36.8 C), temperature source Oral, resp. rate 18, height 5\' 6"  (1.676 m), weight 103.3 kg, SpO2 99 %.  Constitutional: No distress . Vital signs reviewed. HEENT: EOMI, oral membranes moist Neck: supple Cardiovascular: RRR without murmur. No JVD    Respiratory/Chest: CTA Bilaterally without wheezes or rales. Normal effort    GI/Abdomen: BS +, non-tender, non-distended Ext: no clubbing, cyanosis, or edema Psych: pleasant and cooperative. No anxiety with me Neuro: normal language, speech. normal CN exam. Reasonable insight and awareness. LUE- Deltoid/biceps 2+ to 3-/5, Triceps/WE 3/5, grip 4-/5, finger abd 4-/5 RLE- HF, KE, DF and PF 5/5 LLE_ HF 2+ to 3-/5, KE 2+/5, DF 3+/5, PF 3+/5, some cogwheeling with MMT Decreased sensation left finger tips and toes. Left facial numbness Skin:    Comments: No skin breakdown seen Has a large callus on R first MTP/bottom of foot       Assessment/Plan: 1. Functional deficits secondary to left hemiparesis which require 3+ hours per day of interdisciplinary therapy in a comprehensive inpatient rehab setting.  Physiatrist is providing close team supervision and 24 hour management of active  medical problems listed below.  Physiatrist and rehab team continue to assess barriers to discharge/monitor patient progress toward functional and medical goals  Care Tool:  Bathing    Body parts bathed by patient: Right arm, Left arm, Chest, Abdomen, Front perineal area, Buttocks, Right upper leg, Left upper leg, Right lower leg, Left lower leg, Face         Bathing assist Assist Level: Supervision/Verbal cueing     Upper Body Dressing/Undressing Upper body dressing   What is the patient wearing?: Pull over shirt    Upper body assist Assist Level: Independent    Lower Body Dressing/Undressing Lower body dressing      What is the patient wearing?: Pants, Underwear/pull up     Lower body assist Assist for lower body dressing: Supervision/Verbal cueing     Toileting Toileting    Toileting assist Assist for toileting: Supervision/Verbal cueing     Transfers Chair/bed transfer  Transfers assist     Chair/bed transfer assist level: Contact Guard/Touching assist     Locomotion Ambulation   Ambulation assist      Assist level: Total Assistance - Patient < 25% Assistive device: Lite Gait Max distance: >500'   Walk 10 feet activity   Assist     Assist level: Minimal Assistance - Patient > 75% Assistive device: No Device   Walk 50 feet activity   Assist Walk 50 feet with 2 turns activity did not occur: Safety/medical concerns  Assist level: Minimal Assistance - Patient > 75% Assistive device: No Device    Walk 150 feet activity  Assist Walk 150 feet activity did not occur: Safety/medical concerns  Assist level: Minimal Assistance - Patient > 75% Assistive device: No Device    Walk 10 feet on uneven surface  activity   Assist     Assist level: Minimal Assistance - Patient > 75% Assistive device: Aeronautical engineer Will patient use wheelchair at discharge?: No             Wheelchair 50 feet with 2  turns activity    Assist            Wheelchair 150 feet activity     Assist          Blood pressure 129/77, pulse 80, temperature 98.2 F (36.8 C), temperature source Oral, resp. rate 18, height 5\' 6"  (1.676 m), weight 103.3 kg, SpO2 99 %.    Medical Problem List and Plan: 1.  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  Antithrombotics: -DVT/anticoagulation: SCDs             -antiplatelet therapy: Aspirin 305 mg daily and Plavix 75 mg daily x1 month then aspirin alone 3. Pain Management: Lidoderm patch, oxycodone as needed- has migraines'hx of panic attacks.   -10/14: gabapentin 300 TID added for trigeminal neuralgia.     4. Mood: Provide emotional support             -antipsychotic agents: N/A  -appreciate neuropsych evaluation and recs 5. Neuropsych: This patient is capable of making decisions on her own behalf.   6. Skin/Wound Care: Routine skin checks. May d/c IV.  7. Fluids/Electrolytes/Nutrition: Routine in and outs with follow-up chemistries 8.  Migraine headaches.  Topamax 25 mg daily-   9.  Hyperlipidemia.  Lipitor 10. Hypertensive emergency: BP went up to 220s/1teens on admision- improved with 1x dose of IV labetalol.  10/15:  BP labile- continue to monitor. Her home amlodipine dose was restarted.   10/19 improved control 11. Chest pain on 10/12: EKG was obtained and showed nonspecific ST elevation. Nitroglycerin and oxygen were administered. Episode resolved. She does have some chest pressure while working with therapy this morning; it is nontender to palpation. Continue to monitor closely.   10/15: discussed meditation to help relieve anxiety and panic attacks   10/19 appears more relaxed   12. Low albumin: supplement initiated 10/13.      LOS: 7 days A FACE TO FACE EVALUATION WAS  PERFORMED  Meredith Staggers 03/17/2020, 10:42 AM

## 2020-03-17 NOTE — Progress Notes (Signed)
Patient ID: Joyce [name], female   DOB: Jan 02, 1976, 44 y.o.   MRN: [id number]  SW met with pt and called pt s/o [name] [phone]) to provide updates from team conference, and d/c date 10/26. SW informed there will be follow-up to confirm further d/c recommendations. SW discussed family education. Preferred day is Friday, 10/22. If outpatient therapy, prefers [facility]. SW discussed scheduling opt services being uninsured.   Loralee Pacas, MSW, [place] Office: [phone] Cell: [phone] Fax: [phone]

## 2020-03-18 ENCOUNTER — Inpatient Hospital Stay (HOSPITAL_COMMUNITY): Payer: Medicaid Other | Admitting: Occupational Therapy

## 2020-03-18 ENCOUNTER — Inpatient Hospital Stay (HOSPITAL_COMMUNITY): Payer: Medicaid Other

## 2020-03-18 ENCOUNTER — Inpatient Hospital Stay (HOSPITAL_COMMUNITY): Payer: Medicaid Other | Admitting: Physical Therapy

## 2020-03-18 MED ORDER — ALPRAZOLAM 0.25 MG PO TABS
0.2500 mg | ORAL_TABLET | Freq: Every day | ORAL | Status: DC
  Administered 2020-03-18 – 2020-03-24 (×7): 0.25 mg via ORAL
  Filled 2020-03-18 (×7): qty 1

## 2020-03-18 NOTE — Progress Notes (Signed)
Pt recPhysical Therapy Session Note  Patient Details  Name: Joyce [name] MRN: [id number] Date of Birth: 08/31/1975  Today's Date: 03/18/2020 PT Individual Time: 1103-1200 and 1502-1530 PT Individual Time Calculation (min): 57 min and 28 min  Short Term Goals: Week 2:  PT Short Term Goal 1 (Week 2): STGs = LTGs due to ELOS  Skilled Therapeutic Interventions/Progress Updates:     Pt received toileting with RN, agreeable to therapy. No complaint of pain. WC transport to gym for time management. Pt performs stand pivot transfer to mat table with supervision and cues for postioning. Pt performs NMR in standing with green exercise ball. Pt stands and performs lateral trunk rotations, holding ball out in front of body with extended elbows. Cues to move from using core muscles rather than shoulder rotation. Activity performed rotating from L lower quadrant to R upper quadrant, then R lower quadrant to L upper quadrant, using PNF patterns of movement and rotation.  Pt performs repeated sit to stands, holding green exercise ball out in front of of body with extended arms. PT cues for body mechanics, weight shifting, use of power and increased eccentric control while going from stand to sit. x5 reps with brief seated rest breaks between each rep.  Pt stands and shoots basketball to work on reactions, weight shifting, and dynamic balance. Pt performs without supervision and does not have any LOBs. PT tosses ball to pt on L side to encourage L weight shifting and use of LUE.  Pt left seated in WC with alarm intact and all needs within reach.  2nd Session: Pt received supine in bed and agrees to therapy. No complaint of pain. Bed mobility mod(I) with bed features. Pt performs multiple bouts of ambulation without AD and with very close supervision from PT. PT cues for increasing gait speed, increasing lateral weight shifting, and increasing LLE hip flexion. Bouts of 200', 150', 100' with extended seated  rest breaks. Pt demos L toe drag with fatigue.  Pt performs TUG without AD. On 1st attempt pt socre 33.1. On second attempt pt score 31.6.   Pt left seated in WC with alarm intact and all needs within reach.    Therapy Documentation Precautions:  Precautions Precautions: Fall Precaution Comments: left side hemiparesis Restrictions Weight Bearing Restrictions: No    Therapy/Group: Individual Therapy  Breck Coons, PT, DPT 03/18/2020, 3:45 PM

## 2020-03-18 NOTE — Progress Notes (Signed)
[place] PHYSICAL MEDICINE & REHABILITATION PROGRESS NOTE   Subjective/Complaints: Had another good night. We discussed her anxiety, and she admitted to it being a barrier for her at times, even prior to stroke  ROS: Patient denies fever, rash, sore throat, blurred vision, nausea, vomiting, diarrhea, cough, shortness of breath or chest pain, joint or back pain, headache  .    Objective:   No results found. No results for input(s): WBC, HGB, HCT, PLT in the last 72 hours. No results for input(s): NA, K, CL, CO2, GLUCOSE, BUN, CREATININE, CALCIUM in the last 72 hours.  Intake/Output Summary (Last 24 hours) at 03/18/2020 0850 Last data filed at 03/18/2020 0833 Gross per 24 hour  Intake 800 ml  Output --  Net 800 ml        Physical Exam: Vital Signs Blood pressure 137/81, pulse 73, temperature 98.1 F (36.7 C), resp. rate 18, height 5\' 6"  (1.676 m), weight 103.3 kg, SpO2 100 %.  Constitutional: No distress . Vital signs reviewed. HEENT: EOMI, oral membranes moist Neck: supple Cardiovascular: RRR without murmur. No JVD    Respiratory/Chest: CTA Bilaterally without wheezes or rales. Normal effort    GI/Abdomen: BS +, non-tender, non-distended Ext: no clubbing, cyanosis, or edema Psych: pleasant and cooperative. Left hand began to shake when we discussed her anxiety Neuro: normal language, speech. normal CN exam. Reasonable insight and awareness. LUE- Deltoid/biceps 2+ to 3-/5, Triceps/WE 3/5, grip 4-/5, finger abd 4-/5 RLE- HF, KE, DF and PF 5/5 LLE_ HF 2+ to 3-/5, KE 2+/5, DF 3+/5, PF 3+/5--stable Decreased sensation left finger tips and toes. Left facial numbness Skin:    Comments:   Has a large callus on R first MTP/bottom of foot       Assessment/Plan: 1. Functional deficits secondary to left hemiparesis which require 3+ hours per day of interdisciplinary therapy in a comprehensive inpatient rehab setting.  Physiatrist is providing close team supervision and 24  hour management of active medical problems listed below.  Physiatrist and rehab team continue to assess barriers to discharge/monitor patient progress toward functional and medical goals  Care Tool:  Bathing    Body parts bathed by patient: Right arm, Left arm, Chest, Abdomen, Front perineal area, Buttocks, Right upper leg, Left upper leg, Right lower leg, Left lower leg, Face         Bathing assist Assist Level: Supervision/Verbal cueing     Upper Body Dressing/Undressing Upper body dressing   What is the patient wearing?: Pull over shirt    Upper body assist Assist Level: Independent    Lower Body Dressing/Undressing Lower body dressing      What is the patient wearing?: Pants, Underwear/pull up     Lower body assist Assist for lower body dressing: Supervision/Verbal cueing     Toileting Toileting    Toileting assist Assist for toileting: Supervision/Verbal cueing     Transfers Chair/bed transfer  Transfers assist     Chair/bed transfer assist level: Contact Guard/Touching assist     Locomotion Ambulation   Ambulation assist      Assist level: Contact Guard/Touching assist Assistive device: No Device Max distance: 180'   Walk 10 feet activity   Assist     Assist level: Contact Guard/Touching assist Assistive device: No Device   Walk 50 feet activity   Assist Walk 50 feet with 2 turns activity did not occur: Safety/medical concerns  Assist level: Contact Guard/Touching assist Assistive device: No Device    Walk 150 feet activity  Assist Walk 150 feet activity did not occur: Safety/medical concerns  Assist level: Contact Guard/Touching assist Assistive device: No Device    Walk 10 feet on uneven surface  activity   Assist     Assist level: Minimal Assistance - Patient > 75% Assistive device: Walker-rolling   Wheelchair     Assist Will patient use wheelchair at discharge?: No             Wheelchair 50 feet  with 2 turns activity    Assist            Wheelchair 150 feet activity     Assist          Blood pressure 137/81, pulse 73, temperature 98.1 F (36.7 C), resp. rate 18, height 5\' 6"  (1.676 m), weight 103.3 kg, SpO2 100 %.    Medical Problem List and Plan: 1.  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  Antithrombotics: -DVT/anticoagulation: SCDs             -antiplatelet therapy: Aspirin 305 mg daily and Plavix 75 mg daily x1 month then aspirin alone 3. Pain Management: Lidoderm patch, oxycodone as needed- has migraines'hx of panic attacks.   -10/14: gabapentin 300 TID added for trigeminal neuralgia.     4. Mood: Provide emotional support             -antipsychotic agents: N/A  -appreciate neuropsych evaluation and recs  10/20 discussed anxiety as above. She's already on lexapro. Interested in taking something else for anxiety. Will add scheduled low dose xanax in am.    -continue with coping strategies as described by neuropsych 5. Neuropsych: This patient is capable of making decisions on her own behalf.   6. Skin/Wound Care: Routine skin checks. May d/c IV.  7. Fluids/Electrolytes/Nutrition: Routine in and outs with follow-up chemistries 8.  Migraine headaches.  Topamax 25 mg daily-   9.  Hyperlipidemia.  Lipitor 10. Hypertensive emergency: BP went up to 220s/1teens on admision- improved with 1x dose of IV labetalol.  10/15:  BP labile- continue to monitor. Her home amlodipine dose was restarted.   10/20 improved control 11. Chest pain on 10/12: EKG was obtained and showed nonspecific ST elevation. Nitroglycerin and oxygen were administered. Episode resolved.   -suspect this was anxiety related 12. Low albumin: supplement initiated 10/13.      LOS: 8 days A FACE TO FACE EVALUATION WAS PERFORMED  Meredith Staggers 03/18/2020, 8:50 AM

## 2020-03-18 NOTE — Progress Notes (Signed)
Patient ID: Joyce [name], female   DOB: 1976-02-05, 44 y.o.   MRN: [id number]  Family education changed to Monday 10/25 9am-11am.   Loralee Pacas, MSW, [place] Office: [phone] Cell: [phone] Fax: [phone]

## 2020-03-18 NOTE — Progress Notes (Signed)
Occupational Therapy Session Note  Patient Details  Name: Joyce [name] MRN: [id number] Date of Birth: Jul 29, 1975  Today's Date: 03/18/2020 OT Individual Time: 0915-1000 OT Individual Time Calculation (min): 45 min    Short Term Goals: Week 1:  OT Short Term Goal 1 (Week 1): Pt will demonstrate improved hand grasp to hold a container in L hand to take the top off the container with her R hand. OT Short Term Goal 2 (Week 1): Pt will be able to tolerate standing in shower with close S for at least 5 min to demonstrate improved activity tolerance. OT Short Term Goal 3 (Week 1): Pt will demonstrate improved dynamic standing balance of pulling pants over hips with close S. OT Short Term Goal 4 (Week 1): Pt will complete toilet transfers with S using a RW.  Skilled Therapeutic Interventions/Progress Updates:    Pt seen this session to focus on functional mobility and balance with self care skills. Overall, pt only needed set up to S using her RW to ambulate around her room to gather clothing, put dirty clothes away, ambulate to toilet then shower.  Provided pt with a walker bag to transport her items.  She did not have any tremors today but was taking very small, shuffling steps and even with cues was not able to follow through with a full step through due to her stating she felt very stiff today. At end of session pt resting in wc with belt on and all needs met.  Therapy Documentation Precautions:  Precautions Precautions: Fall Precaution Comments: left side hemiparesis Restrictions Weight Bearing Restrictions: No       Pain: Pain Assessment Pain Score: 9  Pain Type: Neuropathic pain Pain Location: Finger (Comment which one) Pain Orientation: Left Pain Descriptors / Indicators: Pins and needles Pain Onset: On-going Patients Stated Pain Goal: 0 Pain Intervention(s): Shower;Ambulation/increased activity (pt declined medication) ADL: ADL Eating: Independent Grooming:  Independent Upper Body Bathing: Setup Where Assessed-Upper Body Bathing: Shower Lower Body Bathing: Supervision/safety Where Assessed-Lower Body Bathing: Shower Upper Body Dressing: Independent Where Assessed-Upper Body Dressing: Wheelchair Lower Body Dressing: Supervision/safety Where Assessed-Lower Body Dressing: Wheelchair Toileting: Supervision/safety Where Assessed-Toileting: Glass blower/designer: Close supervision Toilet Transfer Method: Arts development officer: Energy manager: Close supervision Social research officer, government Method: Stand pivot [employer]: Radio broadcast assistant, Grab bars   Therapy/Group: Individual Therapy  [place] 03/18/2020, 12:37 PM

## 2020-03-18 NOTE — Progress Notes (Signed)
Physical Therapy Session Note  Patient Details  Name: SAMHITHA ROSEN MRN: [id number] Date of Birth: 27-Mar-1976  Today's Date: 03/18/2020 PT Individual Time: 1300-1411 PT Individual Time Calculation (min): 71 min   Short Term Goals: Week 2:  PT Short Term Goal 1 (Week 2): STGs = LTGs due to ELOS  Skilled Therapeutic Interventions/Progress Updates:   Pt received sitting in WC and agreeable to PT. Pt transported to rehab gym in [facility]. Dynamic standing balance while engaged in use of Wii Fit balance board: Table tilt, Penguin slide, Bubble run x 1 each with min assist from PT to improve use of ankle strategy to control COM and perform improved lateral weight shifts. Pt then performed sustained standing tolerance while engaged in Wii bowling with the RUE support pt able to tolerance 6 frames and 2 frames in standing with 1-2 UE support on RW.   Gait training to and from restroom with RW 2 x 34f  And through hall x 1067fwith supervision assist from PT; cues for improved hip/knee flexion to advance BLE, with poor cary over and mild vaulting noted to compensate for poor step height. Pt able to perform pericare with supervision assist and min assist to stand with LUE on rail.   Pt returned to room and performed  transfer to bed with RW and supervision assist with cues for step height BLE. Sit>supine completed with min assist for LE management, and left supine in bed with call bell in reach and all needs met.      Therapy Documentation Precautions:  Precautions Precautions: Fall Precaution Comments: left side hemiparesis Restrictions Weight Bearing Restrictions: No Pain: denies   Therapy/Group: Individual Therapy  AuLorie Phenix0/20/2021, 2:12 PM

## 2020-03-19 ENCOUNTER — Inpatient Hospital Stay (HOSPITAL_COMMUNITY): Payer: Medicaid Other | Admitting: Occupational Therapy

## 2020-03-19 ENCOUNTER — Inpatient Hospital Stay (HOSPITAL_COMMUNITY): Payer: Medicaid Other

## 2020-03-19 NOTE — Progress Notes (Addendum)
Patient ID: Joyce [name], female   DOB: Dec 14, 1975, 44 y.o.   MRN: [id number]  Per therapy, pt will be appropriate for outpatient PT. SW faxed referral to [facility]  ([phone]/[phone]). SW called pt s/o [name] [phone]) on above updates, and RW needed. SW encouraged follow-up if needed.   *SW met with pt to inform on above. Pt reports she ordered a RW and it should arrive tomorrow.   Loralee Pacas, MSW, [place] Office: [phone] Cell: [phone] Fax: [phone]

## 2020-03-19 NOTE — Progress Notes (Signed)
[place] PHYSICAL MEDICINE & REHABILITATION PROGRESS NOTE   Subjective/Complaints: Felt that her tremors were much better with xanax yesterday. In good spirits this morning.   ROS: Patient denies fever, rash, sore throat, blurred vision, nausea, vomiting, diarrhea, cough, shortness of breath or chest pain, joint or back pain, headache, or    Objective:   No results found. No results for input(s): WBC, HGB, HCT, PLT in the last 72 hours. No results for input(s): NA, K, CL, CO2, GLUCOSE, BUN, CREATININE, CALCIUM in the last 72 hours.  Intake/Output Summary (Last 24 hours) at 03/19/2020 1101 Last data filed at 03/18/2020 1929 Gross per 24 hour  Intake 840 ml  Output --  Net 840 ml        Physical Exam: Vital Signs Blood pressure (!) 146/90, pulse 87, temperature 98.2 F (36.8 C), temperature source Oral, resp. rate 14, height 5\' 6"  (1.676 m), weight 103.3 kg, SpO2 100 %.  Constitutional: No distress . Vital signs reviewed. HEENT: EOMI, oral membranes moist Neck: supple Cardiovascular: RRR without murmur. No JVD    Respiratory/Chest: CTA Bilaterally without wheezes or rales. Normal effort    GI/Abdomen: BS +, non-tender, non-distended Ext: no clubbing, cyanosis, or edema Psych: pleasant and cooperative. Less anxiety today Neuro: normal language, speech. normal CN exam. Reasonable insight and awareness. LUE- Deltoid/biceps 2+ to 3-/5, Triceps/WE 3+/5, grip 4-/5, finger abd 4-/5 RLE- HF, KE, DF and PF 5/5 LLE_ HF  3-/5, KE 2+/5, DF 3+/5, PF 3+/5--stable Decreased sensation left finger tips and toes. Left facial numbness Skin:    Comments:   Has a large callus on R first MTP/bottom of foot       Assessment/Plan: 1. Functional deficits secondary to left hemiparesis which require 3+ hours per day of interdisciplinary therapy in a comprehensive inpatient rehab setting.  Physiatrist is providing close team supervision and 24 hour management of active medical problems  listed below.  Physiatrist and rehab team continue to assess barriers to discharge/monitor patient progress toward functional and medical goals  Care Tool:  Bathing    Body parts bathed by patient: Right arm, Left arm, Chest, Abdomen, Front perineal area, Buttocks, Right upper leg, Left upper leg, Right lower leg, Left lower leg, Face         Bathing assist Assist Level: Set up assist     Upper Body Dressing/Undressing Upper body dressing   What is the patient wearing?: Pull over shirt    Upper body assist Assist Level: Independent    Lower Body Dressing/Undressing Lower body dressing      What is the patient wearing?: Pants, Underwear/pull up     Lower body assist Assist for lower body dressing: Supervision/Verbal cueing     Toileting Toileting    Toileting assist Assist for toileting: Supervision/Verbal cueing     Transfers Chair/bed transfer  Transfers assist     Chair/bed transfer assist level: Supervision/Verbal cueing     Locomotion Ambulation   Ambulation assist      Assist level: Supervision/Verbal cueing Assistive device: No Device Max distance: 200'   Walk 10 feet activity   Assist     Assist level: Supervision/Verbal cueing Assistive device: No Device   Walk 50 feet activity   Assist Walk 50 feet with 2 turns activity did not occur: Safety/medical concerns  Assist level: Supervision/Verbal cueing Assistive device: No Device    Walk 150 feet activity   Assist Walk 150 feet activity did not occur: Safety/medical concerns  Assist level: Supervision/Verbal cueing  Assistive device: No Device    Walk 10 feet on uneven surface  activity   Assist     Assist level: Minimal Assistance - Patient > 75% Assistive device: Aeronautical engineer Will patient use wheelchair at discharge?: No             Wheelchair 50 feet with 2 turns activity    Assist            Wheelchair 150 feet  activity     Assist          Blood pressure (!) 146/90, pulse 87, temperature 98.2 F (36.8 C), temperature source Oral, resp. rate 14, height 5\' 6"  (1.676 m), weight 103.3 kg, SpO2 100 %.    Medical Problem List and Plan: 1.  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  Antithrombotics: -DVT/anticoagulation: SCDs             -antiplatelet therapy: Aspirin 305 mg daily and Plavix 75 mg daily x1 month then aspirin alone 3. Pain Management: Lidoderm patch, oxycodone as needed- has migraines'hx of panic attacks.   -10/14: gabapentin 300 TID added for trigeminal neuralgia.    10/21 pain well controlled 4. Mood: Provide emotional support             -antipsychotic agents: N/A  -appreciate neuropsych evaluation and recs  10/21 improved anxiety with low dose xanax---continue   -lexapro   -continue with coping strategies as described by neuropsych 5. Neuropsych: This patient is capable of making decisions on her own behalf.   6. Skin/Wound Care: Routine skin checks. May d/c IV.  7. Fluids/Electrolytes/Nutrition: Routine in and outs with follow-up chemistries 8.  Migraine headaches.  Topamax 25 mg daily-   9.  Hyperlipidemia.  Lipitor 10. Hypertensive emergency: BP went up to 220s/1teens on admision- improved with 1x dose of IV labetalol.  10/15:  BP labile- continue to monitor. Her home amlodipine dose was restarted.   10/21 improved but still borderline control at times 11. Chest pain on 10/12: EKG was obtained and showed nonspecific ST elevation. Nitroglycerin and oxygen were administered. Episode resolved.   -suspect this was anxiety related 12. Low albumin: supplement initiated 10/13.      LOS: 9 days A FACE TO FACE EVALUATION WAS PERFORMED  Meredith Staggers 03/19/2020, 11:01 AM

## 2020-03-19 NOTE — Progress Notes (Signed)
Occupational Therapy Session Note  Patient Details  Name: Joyce [name] MRN: [id number] Date of Birth: 1976/05/17  Today's Date: 03/19/2020 OT Individual Time: 1115-1200   &   1445-1525 OT Individual Time Calculation (min): 45 min    &   40 min   Short Term Goals: Week 1:  OT Short Term Goal 1 (Week 1): Pt will demonstrate improved hand grasp to hold a container in L hand to take the top off the container with her R hand. OT Short Term Goal 2 (Week 1): Pt will be able to tolerate standing in shower with close S for at least 5 min to demonstrate improved activity tolerance. OT Short Term Goal 3 (Week 1): Pt will demonstrate improved dynamic standing balance of pulling pants over hips with close S. OT Short Term Goal 4 (Week 1): Pt will complete toilet transfers with S using a RW. Week 2:     Skilled Therapeutic Interventions/Progress Updates:    AM session:   Patient seated in w/c, alert and ready for therapy session.  She is able to ambulate with RW to gather clothing for shower with min A to reach to low surfaces.  Transfer to/from arm chair, shower bench and w/c with CS.  Shower completed with set up.  Dressing completed seated in arm chair with set up (max A to donn teds) and CS for CM in stance.  Grooming tasks completed w/c level with set up.   Reviewed and practiced reach and transport of items in kitchen environment with good carryover - CS and min cues with RW.   She ambulated approx 120 feet in hallway back to her room with CS.  She remained seated in w/c at close of session with callbell and tray table in reach.     PM session:   Patient in bathroom, completed toileting with CS.  She ambulated in room to/from w/c and commode with CS.  Hand hygiene mod I from w/c.  She denies pain.  Completed a variety of UB conditioning, GMC, [place] activities with focus on left hand dexterity, pinch, OH reach, throwing motions, symmetry of B UEs - utilizing weighted clothes pins, ace bandage, ball  activities.  She completed design copy task without difficulty or cues after initial instruction.  Short distance ambulation with RW to bed at close of session with CS, sit to supine with min A.  She remained in bed at close of session, bed alarm set and call bell in reach.     Therapy Documentation Precautions:  Precautions Precautions: Fall Precaution Comments: left side hemiparesis Restrictions Weight Bearing Restrictions: No   Therapy/Group: Individual Therapy  Carlos Levering 03/19/2020, 7:38 AM

## 2020-03-19 NOTE — Progress Notes (Signed)
Physical Therapy Session Note  Patient Details  Name: Joyce [name] MRN: [id number] Date of Birth: 1975-06-17  Today's Date: 03/19/2020 PT Individual Time: 5537-4827 and 1335-1414 PT Individual Time Calculation (min): 58 min and 39 min  Short Term Goals: Week 2:  PT Short Term Goal 1 (Week 2): STGs = LTGs due to ELOS  Skilled Therapeutic Interventions/Progress Updates:     1st Session: Pt received seated in WC brushing teeth. NT present. Pt reports 4/10 in L foot. PT provides repositioning and rest breaks to manage pain. WC transport to gym for time management.   Pt performs NMR for standing balance, using trampoline, tossing and catching ball on rebound. Initially pt performs 1x20 chest passes then 1x20 overhead passes. Following seated rest break, pt performs same activity on airex mat for increased challenge and decreased proprioceptive input. Pt requires supervision when standing on ground and CGA when standing on airex.  Pt ambulates 120' with CGA and cues for increased gait speed and multimodal cues to increase L hip flexion to decrease toe drag.  Pt performs sit to supine independently with increased time required. Pt performs supine therex for BLE strengthening. 1x15 BLE quad sets, LAQs, and SLRs. Pt requires AAROM for LLE exercises and demontrates somewhat inconsistent performance. Pt also verbalizes L flank pain when performing L hip flexion.  Pt left seated in WC with all needs within reach.  2nd Session: Pt received seated in WC and agreeable to therapy. No complaint of pain. WC transport outside for time management and energy conservation.   Session focused on gait training outside for community reintegration, with pt ambulating over unlevel surface, brick and pavement, stairs, curbs, and crossing street. PT provides CGA for safety. Bouts of ambulation 225' and 125'. Pt ambulates down 8 5 inch steps with RHR during 1st bout and on 2nd bout descends 8 steps and then ascends  8 steps with LHR. Pt gait speed improved from earlier session, but still demos L toe drag with fatigue.  Pt left seated in WC with alarm intact and all needs within reach.    Therapy Documentation Precautions:  Precautions Precautions: Fall Precaution Comments: left side hemiparesis Restrictions Weight Bearing Restrictions: No    Therapy/Group: Individual Therapy  Breck Coons, PT, DPT 03/19/2020, 4:13 PM

## 2020-03-20 ENCOUNTER — Encounter (HOSPITAL_COMMUNITY): Payer: Medicaid Other

## 2020-03-20 ENCOUNTER — Inpatient Hospital Stay (HOSPITAL_COMMUNITY): Payer: Medicaid Other | Admitting: Occupational Therapy

## 2020-03-20 ENCOUNTER — Inpatient Hospital Stay (HOSPITAL_COMMUNITY): Payer: Medicaid Other

## 2020-03-20 NOTE — Progress Notes (Signed)
Physical Therapy Session Note  Patient Details  Name: KLANI CARIDI MRN: [id number] Date of Birth: Apr 23, 1976  Today's Date: 03/20/2020 PT Group Time: 8685-4883 PT Group Time Calculation (min): 60 min  Short Term Goals: Week 1:  PT Short Term Goal 1 (Week 1): Pt will perform bed mobility with supervision. PT Short Term Goal 1 - Progress (Week 1): Met PT Short Term Goal 2 (Week 1): Pt will perform bed to chair transfer with CGA. PT Short Term Goal 2 - Progress (Week 1): Met PT Short Term Goal 3 (Week 1): Pt will ambulate x50' with minA and no AD. PT Short Term Goal 3 - Progress (Week 1): Met PT Short Term Goal 4 (Week 1): Pt will complete 4 steps with LHR and minA. PT Short Term Goal 4 - Progress (Week 1): Met Week 2:  PT Short Term Goal 1 (Week 2): STGs = LTGs due to ELOS  Skilled Therapeutic Interventions/Progress Updates:    Pt participated in therapeutic activities group to address functional balance, muscular endurance, sit <> stands, and transfers in a social setting. Pt requires overall CGA with RW for transfers, dynamic standing balance and gait during session. Pt participated in [place] activity to address the above impairments. Pt able to gait x 120' with CGA overall with RW and cues for LLE clearance and increasing hip flexion back to pt room.     Therapy Documentation Precautions:  Precautions Precautions: Fall Precaution Comments: left side hemiparesis Restrictions Weight Bearing Restrictions: No  Pain:  Denies pain.    Therapy/Group: Group Therapy   Lars Masson, PT, DPT, CBIS  03/20/2020, 11:53 AM

## 2020-03-20 NOTE — Progress Notes (Signed)
Physical Therapy Session Note  Patient Details  Name: Joyce [name] MRN: [id number] Date of Birth: 1976/02/08  Today's Date: 03/20/2020 PT Individual Time: 6945-0388 PT Individual Time Calculation (min): 71 min   Short Term Goals: Week 2:  PT Short Term Goal 1 (Week 2): STGs = LTGs due to ELOS  Skilled Therapeutic Interventions/Progress Updates:     Pt received supine in bed and agrees to therapy. No complaint of pain. Supine to sit mod(I) with bed features. Stand step transfer to toilet without AD and with cues from PT for step pattern and increased step height to prevent toe drag. Pt performs pericare independently and stands from toilet with cues on hand placement on grab bars.  WC transport to virtual apartment for time management. Pt practices furniture transfer on couch. Decreased eccentric control when performing stand to sit on couch. PT provides extensive cuing on body mechanics and sequencing to facilitate optimal ease of sit to stand from low surface, including demonstrations. Pt attempts multiple times and cannot complete without physical assistance. PT provides minA to encourage anterior weight shifting and pt completes x2 transfers.  Pt performs x12 steps with LHR and supervision for safety. Cues for sequencing and use of handrails. Pt performs extremely slowly but does not have any LOBs.  Pt ambulates 150' x2 with extended seated rest break. PT cues for increased arm swing, increased bilateral stride length, step height, and gait velocity. Pt intitially ambulates very slowly but is able to increase speed with time and cueing.   Pt performs sit to supine mod(I). Left supine in bed with alarm intact and all needs within reach.  Therapy Documentation Precautions:  Precautions Precautions: Fall Precaution Comments: left side hemiparesis Restrictions Weight Bearing Restrictions: No   Therapy/Group: Individual Therapy  Breck Coons, PT, DPT 03/20/2020, 4:02 PM

## 2020-03-20 NOTE — Progress Notes (Signed)
[place] PHYSICAL MEDICINE & REHABILITATION PROGRESS NOTE   Subjective/Complaints: Continues to improve. Happy with progress. Walked 120' CGA yesterday. Pain improving also  ROS: Patient denies fever, rash, sore throat, blurred vision, nausea, vomiting, diarrhea, cough, shortness of breath or chest pain, joint or back pain, headache, or mood change.   Objective:   No results found. No results for input(s): WBC, HGB, HCT, PLT in the last 72 hours. No results for input(s): NA, K, CL, CO2, GLUCOSE, BUN, CREATININE, CALCIUM in the last 72 hours.  Intake/Output Summary (Last 24 hours) at 03/20/2020 1230 Last data filed at 03/19/2020 1928 Gross per 24 hour  Intake 680 ml  Output --  Net 680 ml        Physical Exam: Vital Signs Blood pressure 115/75, pulse 79, temperature 98.1 F (36.7 C), temperature source Oral, resp. rate 18, height 5\' 6"  (1.676 m), weight 103.3 kg, SpO2 100 %.  Constitutional: No distress . Vital signs reviewed. HEENT: EOMI, oral membranes moist Neck: supple Cardiovascular: RRR without murmur. No JVD    Respiratory/Chest: CTA Bilaterally without wheezes or rales. Normal effort    GI/Abdomen: BS +, non-tender, non-distended Ext: no clubbing, cyanosis, or edema Psych: pleasant and cooperative Neuro: normal language, speech. normal CN exam. Reasonable insight and awareness. LUE- Deltoid/biceps 3/5, Triceps/WE 3+/5, grip 4- to 4/5, finger abd 4/5 RLE- HF, KE, DF and PF 5/5 LLE_ HF  3/5, KE 3-/5, DF 3+/5, PF 3+/5-  Decreased sensation left finger tips and toes. Left facial numbness. No tremors today Skin:   has a large callus on R first MTP/bottom of foot       Assessment/Plan: 1. Functional deficits secondary to left hemiparesis which require 3+ hours per day of interdisciplinary therapy in a comprehensive inpatient rehab setting.  Physiatrist is providing close team supervision and 24 hour management of active medical problems listed  below.  Physiatrist and rehab team continue to assess barriers to discharge/monitor patient progress toward functional and medical goals  Care Tool:  Bathing    Body parts bathed by patient: Right arm, Left arm, Chest, Abdomen, Front perineal area, Buttocks, Right upper leg, Left upper leg, Right lower leg, Left lower leg, Face         Bathing assist Assist Level: Set up assist     Upper Body Dressing/Undressing Upper body dressing   What is the patient wearing?: Pull over shirt    Upper body assist Assist Level: Independent    Lower Body Dressing/Undressing Lower body dressing      What is the patient wearing?: Pants, Underwear/pull up     Lower body assist Assist for lower body dressing: Supervision/Verbal cueing     Toileting Toileting    Toileting assist Assist for toileting: Supervision/Verbal cueing     Transfers Chair/bed transfer  Transfers assist     Chair/bed transfer assist level: Contact Guard/Touching assist     Locomotion Ambulation   Ambulation assist      Assist level: Contact Guard/Touching assist Assistive device: Walker-rolling Max distance: 120'   Walk 10 feet activity   Assist     Assist level: Contact Guard/Touching assist Assistive device: Walker-rolling   Walk 50 feet activity   Assist Walk 50 feet with 2 turns activity did not occur: Safety/medical concerns  Assist level: Contact Guard/Touching assist Assistive device: Walker-rolling    Walk 150 feet activity   Assist Walk 150 feet activity did not occur: Safety/medical concerns  Assist level: Contact Guard/Touching assist Assistive device: No Device  Walk 10 feet on uneven surface  activity   Assist     Assist level: Contact Guard/Touching assist Assistive device: Walker-rolling   Wheelchair     Assist Will patient use wheelchair at discharge?: No             Wheelchair 50 feet with 2 turns activity    Assist             Wheelchair 150 feet activity     Assist          Blood pressure 115/75, pulse 79, temperature 98.1 F (36.7 C), temperature source Oral, resp. rate 18, height 5\' 6"  (1.676 m), weight 103.3 kg, SpO2 100 %.    Medical Problem List and Plan: 1.  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  Antithrombotics: -DVT/anticoagulation: SCDs             -antiplatelet therapy: Aspirin 305 mg daily and Plavix 75 mg daily x1 month then aspirin alone 3. Pain Management: Lidoderm patch, oxycodone as needed- has migraines'hx of panic attacks.   -10/14: gabapentin 300 TID added for trigeminal neuralgia.    10/22 pain well controlled 4. Mood:               -antipsychotic agents: N/A  -appreciate neuropsych evaluation and recs  10/21-22 improved anxiety with low dose xanax---continue 0.25mg  qd   -lexapro   -continue with coping strategies as described by neuropsych   -team providing positive reinforcement 5. Neuropsych: This patient is capable of making decisions on her own behalf.   6. Skin/Wound Care: Routine skin checks. May d/c IV.  7. Fluids/Electrolytes/Nutrition: Routine in and outs with follow-up chemistries 8.  Migraine headaches.  Topamax 25 mg daily-   9.  Hyperlipidemia.  Lipitor 10. Hypertensive emergency: BP went up to 220s/1teens on admision- improved with 1x dose of IV labetalol.  10/15:  BP labile- continue to monitor. Her home amlodipine dose was restarted.   10/22 improved but inconsistent control---no changes today 11. Chest pain on 10/12: EKG was obtained and showed nonspecific ST elevation. Nitroglycerin and oxygen were administered. Episode resolved.   -suspect this was anxiety related 12. Low albumin: supplement initiated 10/13.     -eating well  LOS: 10 days A FACE TO [place] 03/20/2020, 12:30 PM

## 2020-03-20 NOTE — Progress Notes (Signed)
Occupational Therapy Weekly Progress Note  Patient Details  Name: Joyce [name] MRN: [id number] Date of Birth: January 18, 1976  Beginning of progress report period: March 11, 2020 End of progress report period: March 20, 2020  Today's Date: 03/20/2020 OT Individual Time: 0915-1000 OT Individual Time Calculation (min): 45 min    Patient has met 4 of 4 short term goals.  Pt has been making good progress to now completing transfers and ADLs with S.  She continues to be challenged by muscle fatigue, LLE pain and weakness. LUE functional use has improved extremely well, she uses it actively in B tasks.   Patient continues to demonstrate the following deficits: muscle weakness and muscle joint tightness, decreased cardiorespiratoy endurance, decreased coordination and decreased standing balance, hemiplegia and decreased balance strategies and therefore will continue to benefit from skilled OT intervention to enhance overall performance with BADL and iADL.  Patient progressing toward long term goals..  Continue plan of care.  OT Short Term Goals Week 1:  OT Short Term Goal 1 (Week 1): Pt will demonstrate improved hand grasp to hold a container in L hand to take the top off the container with her R hand. OT Short Term Goal 1 - Progress (Week 1): Met OT Short Term Goal 2 (Week 1): Pt will be able to tolerate standing in shower with close S for at least 5 min to demonstrate improved activity tolerance. OT Short Term Goal 2 - Progress (Week 1): Met OT Short Term Goal 3 (Week 1): Pt will demonstrate improved dynamic standing balance of pulling pants over hips with close S. OT Short Term Goal 3 - Progress (Week 1): Met OT Short Term Goal 4 (Week 1): Pt will complete toilet transfers with S using a RW. OT Short Term Goal 4 - Progress (Week 1): Met Week 2:  OT Short Term Goal 1 (Week 2): STGs = LTGs of Mod independence  Skilled Therapeutic Interventions/Progress Updates:    Pt was able to  complete bed mobility, sit to stand, ambulation to bathroom, showered, dressed, stood at sink to brush teeth all with supervision.  She even donned her TED hose independently using the bag technique.   Pt continues to move slowly and needs frequent rest breaks. She continues to initially take shuffling steps but follows through well when cued to take larger steps.    Pt states she is anxious to return home and feeling ready to leave next week.  Pt resting in wc at end of session with all needs met.  Therapy Documentation Precautions:  Precautions Precautions: Fall Precaution Comments: left side hemiparesis Restrictions Weight Bearing Restrictions: No       Pain:  has numbness in L hand which is uncomfortable for her ADL: ADL Eating: Independent Grooming: Independent Upper Body Bathing: Independent Where Assessed-Upper Body Bathing: Shower Lower Body Bathing: Supervision/safety Where Assessed-Lower Body Bathing: Shower Upper Body Dressing: Independent Where Assessed-Upper Body Dressing: Edge of bed Lower Body Dressing: Supervision/safety Where Assessed-Lower Body Dressing: Edge of bed Toileting: Supervision/safety Where Assessed-Toileting: Glass blower/designer: Close supervision Toilet Transfer Method: Arts development officer: Energy manager: Close supervision Social research officer, government Method: Stand pivot [employer]: Radio broadcast assistant, Grab bars  Therapy/Group: Individual Therapy  [place] 03/20/2020, 9:55 AM

## 2020-03-21 DIAGNOSIS — G43909 Migraine, unspecified, not intractable, without status migrainosus: Secondary | ICD-10-CM

## 2020-03-21 DIAGNOSIS — R0989 Other specified symptoms and signs involving the circulatory and respiratory systems: Secondary | ICD-10-CM

## 2020-03-21 NOTE — Progress Notes (Signed)
[place] PHYSICAL MEDICINE & REHABILITATION PROGRESS NOTE   Subjective/Complaints: Patient seen sitting up in bed this morning.  She states she slept well overnight.  She states she needs to use the restroom.  ROS: Denies CP, SOB, N/V/D  Objective:   No results found. No results for input(s): WBC, HGB, HCT, PLT in the last 72 hours. No results for input(s): NA, K, CL, CO2, GLUCOSE, BUN, CREATININE, CALCIUM in the last 72 hours. No intake or output data in the 24 hours ending 03/21/20 1135      Physical Exam: Vital Signs Blood pressure 115/60, pulse 72, temperature 98.2 F (36.8 C), resp. rate 17, height 5\' 6"  (1.676 m), weight 103.3 kg, SpO2 100 %. Constitutional: No distress . Vital signs reviewed. HENT: Normocephalic.  Atraumatic. Eyes: EOMI. No discharge. Cardiovascular: No JVD.  RRR. Respiratory: Normal effort.  No stridor.  Bilateral clear to auscultation. GI: Non-distended.  BS +. Skin: Warm and dry.  Intact. Psych: Normal mood.  Normal behavior. Musc: No edema in extremities.  No tenderness in extremities. Neuro: Alert Motor: LUE: 4 -/5 proximal distal RLE- HF, KE, DF and PF 5/5 LLE: HF  3/5, KE 3-/5, DF 3+/5, PF 3+/5-   Assessment/Plan: 1. Functional deficits secondary to left hemiparesis which require 3+ hours per day of interdisciplinary therapy in a comprehensive inpatient rehab setting.  Physiatrist is providing close team supervision and 24 hour management of active medical problems listed below.  Physiatrist and rehab team continue to assess barriers to discharge/monitor patient progress toward functional and medical goals  Care Tool:  Bathing    Body parts bathed by patient: Right arm, Left arm, Chest, Abdomen, Front perineal area, Buttocks, Right upper leg, Left upper leg, Right lower leg, Left lower leg, Face         Bathing assist Assist Level: Set up assist     Upper Body Dressing/Undressing Upper body dressing   What is the patient  wearing?: Pull over shirt    Upper body assist Assist Level: Independent    Lower Body Dressing/Undressing Lower body dressing      What is the patient wearing?: Pants, Underwear/pull up     Lower body assist Assist for lower body dressing: Supervision/Verbal cueing     Toileting Toileting    Toileting assist Assist for toileting: Supervision/Verbal cueing     Transfers Chair/bed transfer  Transfers assist     Chair/bed transfer assist level: Supervision/Verbal cueing     Locomotion Ambulation   Ambulation assist      Assist level: Supervision/Verbal cueing Assistive device: No Device Max distance: 150'   Walk 10 feet activity   Assist     Assist level: Supervision/Verbal cueing Assistive device: Walker-rolling, No Device   Walk 50 feet activity   Assist Walk 50 feet with 2 turns activity did not occur: Safety/medical concerns  Assist level: Supervision/Verbal cueing Assistive device: No Device    Walk 150 feet activity   Assist Walk 150 feet activity did not occur: Safety/medical concerns  Assist level: Supervision/Verbal cueing Assistive device: No Device    Walk 10 feet on uneven surface  activity   Assist     Assist level: Contact Guard/Touching assist Assistive device: Walker-rolling   Wheelchair     Assist Will patient use wheelchair at discharge?: No             Wheelchair 50 feet with 2 turns activity    Assist  Wheelchair 150 feet activity     Assist           Medical Problem List and Plan: 1.  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  Antithrombotics: -DVT/anticoagulation: SCDs             -antiplatelet therapy: Aspirin 325 mg daily and Plavix 75 mg daily x1 month then aspirin alone   Labs ordered for Monday 3. Pain Management: Lidoderm patch, oxycodone as needed- has migraines'hx of panic attacks.   -10/14: gabapentin 300 TID  added for trigeminal neuralgia.  Controlled with meds on 10/23 4. Mood:               -antipsychotic agents: N/A  -appreciate neuropsych evaluation and recs  Low dose xanax---continue 0.25mg  qd   -lexapro   -continue with coping strategies as described by neuropsych   -team providing positive reinforcement   Appears controlled with meds on 10/23 5. Neuropsych: This patient is capable of making decisions on her own behalf.   6. Skin/Wound Care: Routine skin checks.  Labs ordered for Monday 7. Fluids/Electrolytes/Nutrition: Routine in and outs 8.  Migraine headaches.  Topamax 25 mg daily  See #3 9.  Hyperlipidemia.  Lipitor 10.  Essential hypertension:  Home amlodipine dose was restarted.   Labile on 10/23, monitor trend 11. Chest pain on 10/12: Resolved   EKG was obtained and showed nonspecific ST elevation. Nitroglycerin and oxygen were administered. Episode resolved.   -suspect this was anxiety related 12. Low albumin: supplement initiated 10/13.     -eating well  LOS: 11 days A FACE TO FACE EVALUATION WAS PERFORMED  Precious Gilchrest Lorie Phenix 03/21/2020, 11:35 AM

## 2020-03-22 ENCOUNTER — Inpatient Hospital Stay (HOSPITAL_COMMUNITY): Payer: Medicaid Other | Admitting: Occupational Therapy

## 2020-03-22 ENCOUNTER — Inpatient Hospital Stay (HOSPITAL_COMMUNITY): Payer: Medicaid Other

## 2020-03-22 DIAGNOSIS — E785 Hyperlipidemia, unspecified: Secondary | ICD-10-CM

## 2020-03-22 DIAGNOSIS — F411 Generalized anxiety disorder: Secondary | ICD-10-CM

## 2020-03-22 NOTE — Progress Notes (Signed)
Physical Therapy Session Note  Patient Details  Name: Joyce [name] MRN: [id number] Date of Birth: 1975/07/02  Today's Date: 03/22/2020 PT Individual Time: 0800-0900 PT Individual Time Calculation (min): 60 min   Short Term Goals: Week 2:  PT Short Term Goal 1 (Week 2): STGs = LTGs due to ELOS  Skilled Therapeutic Interventions/Progress Updates:     Patient in bed upon PT arrival. Patient alert and agreeable to PT session. Patient denied pain during session.  Therapeutic Activity: Bed Mobility: Patient performed supine to/from sit with mod I in a flat bed without use of bed rails. Patient donned socks, pants, shirt and shoes with set-up assist EOB.  Transfers: Patient performed sit to/from stand x5 with supervision for safety/balance with and without RW. Provided verbal cues for increased forward weight shift for improved balance when standing/sitting.  Gait Training:  Patient ambulated 80 feet using a RW with significantly decreased gait speed, L foot drag, small step length, step-to gait pattern leading with R, and decreased knee flexion in swing on L, reported modified RPE of 8/10 after with SOB. She ambulated 285 feet after NMR, see below, without an AD with close supervision. Ambulated with step-through gait pattern, increased L foot clearance, improved L knee flexion in swing, and increased gait speed with increased propulsion. Provided facilitation of L arm swing to increase gait speed and balance with gait, provided cues for increased step length bilaterally for increased L foot clearance and L weight shift in stance, and cued patient to self-correct step height with auditory feedback from shoes scuffing the floor.  Neuromuscular Re-ed: Patient performed the following dynamic gait and pre-gait activities: -ambulated forwards and backwards 25 feet x2 with CGA without AD, focused on hamstring activation for increased L foot clearance and L weight shift in stance with multimodal  cues for proper activation and timing -attempted step-taps on 6" step performed with R foot with CGA and good lateral hip control with tactile cues, patient unable to raise L foot onto the step due to deep shooting pain in her lower abdomen, terminated exercise due to 8/10 pain  Pain only reproducible with hip flexion activation, denies any change with eating or bowl movements, educated on iliopsoas anatomy and function and encouraged use of heat prior to therapies and cold after therapies to reduce pain -performed standing alternating hamstring curls with focus on L hamstring activation and L SLS with lateral hip stability with multimodal cues for proper muscle activation and timing  Provided general stroke education on signs and symptoms and BP management and daily assessment during rest breaks throughout session. Patient receptive and appreciative of education during session.   Patient in w/c in the room at end of session with breaks locked, seat belt alarm set, and all needs within reach.    Therapy Documentation Precautions:  Precautions Precautions: Fall Precaution Comments: left side hemiparesis Restrictions Weight Bearing Restrictions: No   Therapy/Group: Individual Therapy  Afsana Liera L Kevonna Nolte PT, DPT  03/22/2020, 2:40 PM

## 2020-03-22 NOTE — Progress Notes (Signed)
Occupational Therapy Session Note  Patient Details  Name: Joyce [name] MRN: [id number] Date of Birth: Jun 02, 1975  Today's Date: 03/22/2020 OT Individual Time: 1300-1400 OT Individual Time Calculation (min): 60 min    Short Term Goals: Week 2:  OT Short Term Goal 1 (Week 2): STGs = LTGs of Mod independence  Skilled Therapeutic Interventions/Progress Updates:    patient in bed, alert and ready for therapy session.  She denies pain and requests a shower at this time.  Bed mobility with CS.  Ambulation in room with RW to/from bed, arm chair, toilet, shower bench with Supervision.  She is able to gather clothing with CS.  Shower completed with set up, dressing completed seated in arm chair with set up.  She is able to ambulate without AD to w/c with CS.   Provided and reviewed comprehensive HEP to include theraputty and UB AROM/coordination activities.  She demonstrates good understanding.  She ambulated back to bed with CS (approx 10 feet) requests help for legs into bed with min A.  Bed alarm set and call bell in reach.    Therapy Documentation Precautions:  Precautions Precautions: Fall Precaution Comments: left side hemiparesis Restrictions Weight Bearing Restrictions: No  Therapy/Group: Individual Therapy  Carlos Levering 03/22/2020, 7:35 AM

## 2020-03-22 NOTE — Progress Notes (Addendum)
[place] PHYSICAL MEDICINE & REHABILITATION PROGRESS NOTE   Subjective/Complaints: Patient seen sitting up in bed this morning.  She states she slept well overnight.  She denies complaints.  ROS: Denies CP, SOB, N/V/D  Objective:   No results found. No results for input(s): WBC, HGB, HCT, PLT in the last 72 hours. No results for input(s): NA, K, CL, CO2, GLUCOSE, BUN, CREATININE, CALCIUM in the last 72 hours.  Intake/Output Summary (Last 24 hours) at 03/22/2020 1156 Last data filed at 03/22/2020 0901 Gross per 24 hour  Intake 680 ml  Output --  Net 680 ml        Physical Exam: Vital Signs Blood pressure 126/72, pulse 80, temperature 98.2 F (36.8 C), resp. rate 20, height 5\' 6"  (1.676 m), weight 103.3 kg, SpO2 100 %.  Constitutional: No distress . Vital signs reviewed. HENT: Normocephalic.  Atraumatic. Eyes: EOMI. No discharge. Cardiovascular: No JVD.  RRR. Respiratory: Normal effort.  No stridor.  Bilateral clear to auscultation.  GI: Non-distended.  BS +. Skin: Warm and dry.  Intact. Psych: Normal mood.  Normal behavior. Musc: No edema in extremities.  No tenderness in extremities. Neuro: Alert Motor: LUE: 4 -/5 proximal distal RLE- HF, KE, DF and PF 5/5 LLE: HF  3/5, KE 3-/5, DF 3+/5, PF 3+/5,?  Full effort  Assessment/Plan: 1. Functional deficits secondary to left hemiparesis which require 3+ hours per day of interdisciplinary therapy in a comprehensive inpatient rehab setting.  Physiatrist is providing close team supervision and 24 hour management of active medical problems listed below.  Physiatrist and rehab team continue to assess barriers to discharge/monitor patient progress toward functional and medical goals  Care Tool:  Bathing    Body parts bathed by patient: Right arm, Left arm, Chest, Abdomen, Front perineal area, Buttocks, Right upper leg, Left upper leg, Right lower leg, Left lower leg, Face         Bathing assist Assist Level: Set up  assist     Upper Body Dressing/Undressing Upper body dressing   What is the patient wearing?: Pull over shirt    Upper body assist Assist Level: Independent    Lower Body Dressing/Undressing Lower body dressing      What is the patient wearing?: Pants, Underwear/pull up     Lower body assist Assist for lower body dressing: Supervision/Verbal cueing     Toileting Toileting    Toileting assist Assist for toileting: Supervision/Verbal cueing     Transfers Chair/bed transfer  Transfers assist     Chair/bed transfer assist level: Supervision/Verbal cueing     Locomotion Ambulation   Ambulation assist      Assist level: Supervision/Verbal cueing Assistive device: No Device Max distance: 150'   Walk 10 feet activity   Assist     Assist level: Supervision/Verbal cueing Assistive device: Walker-rolling, No Device   Walk 50 feet activity   Assist Walk 50 feet with 2 turns activity did not occur: Safety/medical concerns  Assist level: Supervision/Verbal cueing Assistive device: No Device    Walk 150 feet activity   Assist Walk 150 feet activity did not occur: Safety/medical concerns  Assist level: Supervision/Verbal cueing Assistive device: No Device    Walk 10 feet on uneven surface  activity   Assist     Assist level: Contact Guard/Touching assist Assistive device: Walker-rolling   Wheelchair     Assist Will patient use wheelchair at discharge?: No             Wheelchair 50 feet  with 2 turns activity    Assist            Wheelchair 150 feet activity     Assist           Medical Problem List and Plan: 1.  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  Antithrombotics: -DVT/anticoagulation: SCDs             -antiplatelet therapy: Aspirin 325 mg daily and Plavix 75 mg daily x1 month then aspirin alone   Labs ordered for tomorrow 3. Pain Management: Lidoderm  patch, oxycodone as needed- has migraines'hx of panic attacks.   -10/14: gabapentin 300 TID added for trigeminal neuralgia.  Controlled with meds on 10/24 4. Mood:               -antipsychotic agents: N/A  -appreciate neuropsych evaluation and recs  Low dose xanax---continue 0.25mg  qd   -lexapro   -continue with coping strategies as described by neuropsych   -team providing positive reinforcement   Controlled with meds on 10/24 5. Neuropsych: This patient is capable of making decisions on her own behalf.   6. Skin/Wound Care: Routine skin checks. 7. Fluids/Electrolytes/Nutrition: Routine in and outs  Labs ordered for tomorrow 8.  Migraine headaches.  Topamax 25 mg daily  See #3 9.    Hyperlipidemia: Lipitor 10.  Essential hypertension:  Home amlodipine dose was restarted.   Controlled on 10/24 11. Chest pain on 10/12: Resolved   EKG was obtained and showed nonspecific ST elevation. Nitroglycerin and oxygen were administered. Episode resolved.   -suspect this was anxiety related 12. Low albumin: supplement initiated 10/13.     -eating well  LOS: 12 days A FACE TO FACE EVALUATION WAS PERFORMED  Cyleigh Massaro Lorie Phenix 03/22/2020, 11:56 AM

## 2020-03-22 NOTE — Discharge Summary (Signed)
Physician Discharge Summary  Patient ID: Joyce [name] MRN: [id number] DOB/AGE: Joyce 25, 1977 44 y.o.  Admit date: 03/10/2020 Discharge date: 03/24/2020  Discharge Diagnoses:  Principal Problem:   Left hemiparesis ([place]) Active Problems:   Right thalamic infarction ([place])   Labile blood pressure   Migraine without status migrainosus, not intractable   Anxiety state   Dyslipidemia Hyperlipidemia Medical noncompliance  Discharged Condition: Stable  Significant Diagnostic Studies: MR ANGIO HEAD WO CONTRAST  Result Date: 03/05/2020 CLINICAL DATA:  Left sided numbness EXAM: MRI HEAD WITHOUT CONTRAST MRA HEAD WITHOUT CONTRAST TECHNIQUE: Multiplanar, multiecho pulse sequences of the brain and surrounding structures were obtained without intravenous contrast. Angiographic images of the head were obtained using MRA technique without contrast. COMPARISON:  None. FINDINGS: MRI HEAD Brain: There is an 8 mm focus of restricted diffusion involving the lateral right thalamus. There is no acute infarction or intracranial hemorrhage. There is no intracranial mass, mass effect, or edema. There is no hydrocephalus or extra-axial fluid collection. Ventricles and sulci are normal in size and configuration. Patchy T2 hyperintensity in the supratentorial white matter is nonspecific Joyce reflect minor chronic microvascular ischemic changes. Vascular: Major vessel flow voids at the skull base are preserved. Skull and upper cervical spine: Normal marrow signal is preserved. Sinuses/Orbits: Paranasal sinuses are aerated. Orbits are unremarkable. Other: Sella is unremarkable.  Mastoid air cells are clear. MRA HEAD Intracranial internal carotid arteries are patent. Middle and anterior cerebral arteries are patent. Intracranial vertebral arteries, basilar artery, posterior cerebral arteries are patent. There is no significant stenosis or aneurysm. IMPRESSION: Acute subcentimeter infarct of the lateral right thalamus. No  proximal intracranial vessel occlusion or significant stenosis. Electronically Signed   By: Macy Mis M.D.   On: 03/05/2020 12:40   MR BRAIN WO CONTRAST  Result Date: 03/05/2020 CLINICAL DATA:  Left sided numbness EXAM: MRI HEAD WITHOUT CONTRAST MRA HEAD WITHOUT CONTRAST TECHNIQUE: Multiplanar, multiecho pulse sequences of the brain and surrounding structures were obtained without intravenous contrast. Angiographic images of the head were obtained using MRA technique without contrast. COMPARISON:  None. FINDINGS: MRI HEAD Brain: There is an 8 mm focus of restricted diffusion involving the lateral right thalamus. There is no acute infarction or intracranial hemorrhage. There is no intracranial mass, mass effect, or edema. There is no hydrocephalus or extra-axial fluid collection. Ventricles and sulci are normal in size and configuration. Patchy T2 hyperintensity in the supratentorial white matter is nonspecific Joyce reflect minor chronic microvascular ischemic changes. Vascular: Major vessel flow voids at the skull base are preserved. Skull and upper cervical spine: Normal marrow signal is preserved. Sinuses/Orbits: Paranasal sinuses are aerated. Orbits are unremarkable. Other: Sella is unremarkable.  Mastoid air cells are clear. MRA HEAD Intracranial internal carotid arteries are patent. Middle and anterior cerebral arteries are patent. Intracranial vertebral arteries, basilar artery, posterior cerebral arteries are patent. There is no significant stenosis or aneurysm. IMPRESSION: Acute subcentimeter infarct of the lateral right thalamus. No proximal intracranial vessel occlusion or significant stenosis. Electronically Signed   By: Macy Mis M.D.   On: 03/05/2020 12:40   US Carotid Bilateral  Result Date: 03/05/2020 CLINICAL DATA:  44 year old female with a history of stroke EXAM: BILATERAL CAROTID DUPLEX ULTRASOUND TECHNIQUE: Pearline Cables scale imaging, color Doppler and duplex ultrasound were performed of  bilateral carotid and vertebral arteries in the neck. COMPARISON:  None. FINDINGS: Criteria: Quantification of carotid stenosis is based on velocity parameters that correlate the residual internal carotid diameter with NASCET-based stenosis levels, using the diameter of  the distal internal carotid lumen as the denominator for stenosis measurement. The following velocity measurements were obtained: RIGHT ICA:  Systolic 72 cm/sec, Diastolic 37 cm/sec CCA:  93 cm/sec SYSTOLIC ICA/CCA RATIO:  0.8 ECA:  78 cm/sec LEFT ICA:  Systolic 65 cm/sec, Diastolic 28 cm/sec CCA:  277 cm/sec SYSTOLIC ICA/CCA RATIO:  0.6 ECA:  75 cm/sec Right Brachial SBP: Not acquired Left Brachial SBP: Not acquired RIGHT CAROTID ARTERY: No significant calcified disease of the right common carotid artery. Intermediate waveform maintained. Homogeneous plaque without significant calcifications at the right carotid bifurcation. Low resistance waveform of the right ICA. No significant tortuosity. RIGHT VERTEBRAL ARTERY: Antegrade flow with low resistance waveform. LEFT CAROTID ARTERY: No significant calcified disease of the left common carotid artery. Intermediate waveform maintained. Homogeneous plaque at the left carotid bifurcation without significant calcifications. Low resistance waveform of the left ICA. LEFT VERTEBRAL ARTERY:  Antegrade flow with low resistance waveform. IMPRESSION: Color duplex indicates minimal homogeneous plaque, with no hemodynamically significant stenosis by duplex criteria in the extracranial cerebrovascular circulation. Signed, Dulcy Fanny. Dellia Nims, RPVI [facility] Electronically Signed   By: Corrie Mckusick D.O.   On: 03/05/2020 13:27   ECHOCARDIOGRAM COMPLETE  Result Date: 03/05/2020    ECHOCARDIOGRAM REPORT   Patient Name:   Joyce [name] Date of Exam: 03/05/2020 Medical Rec #:  [id number]          Height:       66.0 in Accession #:    [id number]          Weight:       215.8 lb Date of Birth:  February 26, 1976          BSA:          2.066 m Patient Age:    58 years           BP:           124/70 mmHg Patient Gender: F                  HR:           86 bpm. Exam Location:  [facility] Procedure: 2D Echo Indications:    Stroke 434.91 / I163.9  History:        Patient has no prior history of Echocardiogram examinations.                 Stroke; Risk Factors:Hypertension. 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  Sonographer:    Leavy Cella RDCS (AE) Referring Phys: [id number] OLADAPO ADEFESO IMPRESSIONS  1. Left ventricular ejection fraction, by estimation, is 60 to 65%. The left ventricle has normal function. The left ventricle has no regional wall motion abnormalities. There is mild left ventricular hypertrophy. Left ventricular diastolic parameters are indeterminate.  2. Right ventricular systolic function is normal. The right ventricular size is normal.  3. The mitral valve is normal in structure. No evidence of mitral valve regurgitation. No evidence of mitral stenosis.  4. The aortic valve is tricuspid. Aortic valve regurgitation is not visualized. No aortic stenosis is present.  5. The inferior vena cava is normal in size with greater than 50% respiratory variability, suggesting right atrial pressure of 3 mmHg. FINDINGS  Left Ventricle: Left ventricular ejection fraction, by estimation, is 60 to 65%. The left ventricle has normal function. The left ventricle has no regional wall motion abnormalities.  The left ventricular internal cavity size was normal in size. There is  mild left ventricular hypertrophy. Left ventricular diastolic parameters are indeterminate. Right Ventricle: The right ventricular size is normal. No increase in right ventricular wall thickness. Right ventricular systolic function is normal. Left Atrium: Left atrial size was normal in size. Right Atrium: Right atrial size was normal in size. Pericardium: There is no evidence of pericardial  effusion. Mitral Valve: The mitral valve is normal in structure. No evidence of mitral valve regurgitation. No evidence of mitral valve stenosis. Tricuspid Valve: The tricuspid valve is normal in structure. Tricuspid valve regurgitation is not demonstrated. No evidence of tricuspid stenosis. Aortic Valve: The aortic valve is tricuspid. Aortic valve regurgitation is not visualized. No aortic stenosis is present. Aortic valve mean gradient measures 6.1 mmHg. Aortic valve peak gradient measures 11.0 mmHg. Aortic valve area, by VTI measures 1.70  cm. Pulmonic Valve: The pulmonic valve was not well visualized. Pulmonic valve regurgitation is not visualized. No evidence of pulmonic stenosis. Aorta: The aortic root is normal in size and structure. Pulmonary Artery: Indeterminant PASP, inadequate TR jet. Venous: The inferior vena cava is normal in size with greater than 50% respiratory variability, suggesting right atrial pressure of 3 mmHg. IAS/Shunts: No atrial level shunt detected by color flow Doppler.  LEFT VENTRICLE PLAX 2D LVIDd:         3.81 cm  Diastology LVIDs:         2.69 cm  LV e' medial:    7.07 cm/s LV PW:         1.10 cm  LV E/e' medial:  9.8 LV IVS:        1.07 cm  LV e' lateral:   8.27 cm/s LVOT diam:     2.00 cm  LV E/e' lateral: 8.4 LV SV:         53 LV SV Index:   26 LVOT Area:     3.14 cm  RIGHT VENTRICLE RV S prime:     12.70 cm/s TAPSE (M-mode): 1.9 cm LEFT ATRIUM           Index       RIGHT ATRIUM          Index LA diam:      3.00 cm 1.45 cm/m  RA Area:     9.53 cm LA Vol (A2C): 24.1 ml 11.67 ml/m RA Volume:   20.00 ml 9.68 ml/m LA Vol (A4C): 34.0 ml 16.46 ml/m  AORTIC VALVE AV Area (Vmax):    2.12 cm AV Area (Vmean):   1.90 cm AV Area (VTI):     1.70 cm AV Vmax:           166.14 cm/s AV Vmean:          118.985 cm/s AV VTI:            0.314 m AV Peak Grad:      11.0 mmHg AV Mean Grad:      6.1 mmHg LVOT Vmax:         112.13 cm/s LVOT Vmean:        71.874 cm/s LVOT VTI:          0.170 m  LVOT/AV VTI ratio: 0.54  AORTA Ao Root diam: 2.40 cm MITRAL VALVE MV Area (PHT): 4.52 cm    SHUNTS MV Decel Time: 168 msec    Systemic VTI:  0.17 m MV E velocity: 69.50 cm/s  Systemic Diam: 2.00 cm MV A velocity: 71.70 cm/s  MV E/A ratio:  0.97 Carlyle Dolly MD Electronically signed by Carlyle Dolly MD Signature Date/Time: 03/05/2020/10:47:07 AM    Final    ECHOCARDIOGRAM LIMITED BUBBLE STUDY  Result Date: 03/11/2020    ECHOCARDIOGRAM LIMITED REPORT   Patient Name:   Joyce [name] Date of Exam: 03/11/2020 Medical Rec #:  [id number]          Height:       66.0 in Accession #:    [id number]         Weight:       227.7 lb Date of Birth:  19-Sep-1975          BSA:          2.113 m Patient Age:    39 years           BP:           133/82 mmHg Patient Gender: F                  HR:           82 bpm. Exam Location:  Inpatient Procedure: Limited Echo Indications:    PFO  History:        Patient has prior history of Echocardiogram examinations, most                 recent 03/05/2020. Risk Factors:Hypertension. CVA.  Sonographer:    Jannett Celestine RDCS (AE) Referring Phys: [id number] Izora Ribas  Sonographer Comments: Restricted mobility. patient unable to turn nor raise left arm. IMPRESSIONS  1. Agitated saline contrast bubble study was negative, with no evidence of any interatrial shunt both at rest and with valsalva . FINDINGS  IAS/Shunts: Agitated saline contrast was given intravenously to evaluate for intracardiac shunting. Agitated saline contrast bubble study was negative, with no evidence of any interatrial shunt both at rest and with valsalva . Gwyndolyn Kaufman MD Electronically signed by Gwyndolyn Kaufman MD Signature Date/Time: 03/11/2020/11:31:54 AM    Final    CT HEAD CODE STROKE WO CONTRAST  Result Date: 03/04/2020 CLINICAL DATA:  Code stroke. Left-sided numbness beginning 9 hours ago. EXAM: CT HEAD WITHOUT CONTRAST TECHNIQUE: Contiguous axial images were obtained from the base of the skull  through the vertex without intravenous contrast. COMPARISON:  None. FINDINGS: Brain: Normal appearance without evidence malformation, atrophy, old or acute infarction, mass lesion, hemorrhage, hydrocephalus or extra-axial collection. Vascular: No abnormal vascular finding. Skull: Normal Sinuses/Orbits: Clear/normal Other: None ASPECTS ([place] Stroke Program Early CT Score) - Ganglionic level infarction (caudate, lentiform nuclei, internal capsule, insula, M1-M3 cortex): 7 - Supraganglionic infarction (M4-M6 cortex): 3 Total score (0-10 with 10 being normal): 10 IMPRESSION: 1. Normal head CT. 2. ASPECTS is 10. 3. These results were called by telephone at the time of interpretation on 03/04/2020 at 8:56 pm to provider Fredia Sorrow , who verbally acknowledged these results. Electronically Signed   By: Nelson Chimes M.D.   On: 03/04/2020 20:56    Labs:  Basic Metabolic Panel: No results for input(s): NA, K, CL, CO2, GLUCOSE, BUN, CREATININE, CALCIUM, MG, PHOS in the last 168 hours.  CBC: No results for input(s): WBC, NEUTROABS, HGB, HCT, MCV, PLT in the last 168 hours.  CBG: No results for input(s): GLUCAP in the last 168 hours.  Family history.  Mother with diabetes and hypertension as well as kidney disease asthma and hyperlipidemia.  Father with diabetes mellitus Sister with kidney disease son with asthma Sister with diabetes.  Denies any colon cancer esophageal  cancer or rectal cancer  Brief HPI:   Joyce [name] is a 44 y.o. right-handed female with history of hypertension depression and migraine headaches as well as medical noncompliance.  Patient lives with spouse and children independent prior to admission.  Two-level home bed and bath upstairs.  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  Presented to [facility]/04/2020 with left-sided weakness.  She denied any chest pain or shortness of breath associated with weakness.  Cranial CT scan showed no acute changes.  MRI showed acute  subcentimeter infarct of the lateral right thalamus.  Carotid Dopplers with no ICA stenosis.  MRA of the head with no proximal intracranial vessel occlusion or significant stenosis.  Admission chemistries unremarkable except glucose 114 alcohol negative hemoglobin 15.4 WBC 12,200.  Echocardiogram with ejection fraction of 60 to 65% no wall motion abnormalities.  Maintained on aspirin and Plavix x1 month followed by aspirin alone.  She continued on Topamax for history of headaches.  Tolerating a regular diet.  Therapy evaluations completed and patient was admitted for a comprehensive rehab program.   Hospital Course: Joyce [name] was admitted to rehab 03/10/2020 for inpatient therapies to consist of PT, ST and OT at least three hours five days a week. Past admission physiatrist, therapy team and rehab RN have worked together to provide customized collaborative inpatient rehab.  Pertaining to patient's right thalamic infarction remained stable plan for aspirin and Plavix x1 month followed by aspirin alone and neurology follow-up.  Pain management history of migraine headaches with Lidoderm patch as well as scheduled Neurontin with oxycodone as needed.  Topamax was added to help with her migraine headaches.  She did have a history of panic attacks maintained on Xanax/Lexapro she was using trazodone to help with her sleep.  Neuropsychology follow-up for her anxiety.  Blood pressure controlled with Norvasc.  She would follow-up with her primary MD.   Blood pressures were monitored on TID basis and controlled    Rehab course: During patient's stay in rehab weekly team conferences were held to monitor patient's progress, set goals and discuss barriers to discharge. At admission, patient required minimal assist sit to stand minimal assist stand pivot transfers min mod assist 65 feet rolling walker  Physical exam.  Blood pressure 139/59 pulse 89 temperature 92 respirations 18 oxygen saturation 100% room  air Constitutional.  Patient was a bit anxious. HEENT Head.  Left facial droop tongue midline Neck.  Supple nontender no JVD without thyromegaly Cardiac regular rate rhythm without extra sounds or murmur heard Abdomen.  Soft nontender positive bowel sounds without rebound Respiratory effort normal no respiratory distress without wheeze Musculoskeletal.  Comments right upper extremity 5/5 deltoids biceps triceps wrist extension grip and finger abduction Left upper extremity deltoids biceps 4 -/5 triceps wrist extension 4/5 grip 4/5 finger abduction 4/5 Right lower extremity hip flexion knee extension dorsiflexion plantarflexion 5/5 Left lower extremity hip flexion 3 -/5 knee extension 2+/5 dorsi flexion 3+/5 plantarflexion 4 -/5 Skin.  No breakdown she did have a large callus on right first MTP bottom of foot.  Joyce [name]  has had improvement in activity tolerance, balance, postural control as well as ability to compensate for deficits. Joyce [name] has had improvement in functional use RUE/LUE  and RLE/LLE as well as improvement in awareness.  Patient perform supine to sit from sit with modified independent flat bed without use of bed rails.  Patient donned socks pants shirt shoes with set up assist edge of bed.  Perform sit  to stand from stand x5 with supervision for safety and balance with and without rolling walker.  Ambulates 80 feet rolling walker with significant decreased gait speed left foot drag.  She can increase her ambulation up to 285 feet with close supervision.  Bed mobility with contact-guard ambulates with rolling walker to and from armchair toilet shower bench with supervision.  She is able to gather her belongings for ADLs.  Full family teaching completed plan discharge to home       Disposition: Discharge to home    Diet: Regular  Special Instructions: No driving smoking or alcohol  Plan is for aspirin Plavix x1 month total then aspirin alone  Medications at discharge 1.  Xanax  0.25 mg p.o. daily 2.  Norvasc 5 mg p.o. daily 3.  Aspirin 325 mg p.o. daily 4.  Lipitor 80 mg p.o. daily 5.  Plavix and 5 mg p.o. daily x14 more days and stop 6.  Lexapro 20 mg p.o. daily 7.  Neurontin 300 mg p.o. 3 times daily 8.  Lidoderm patch change as directed 9.  Oxycodone 5 mg every 4 hours as needed moderate pain 10.  Topamax 50 mg p.o. daily 11.  Trazodone 50 mg p.o. nightly as needed sleep  30-35 minutes were spent completing discharge summary and discharge planning  Discharge Instructions    Ambulatory referral to Neurology   Complete by: As directed    An appointment is requested in approximately 4 weeks right thalamic CVA   Ambulatory referral to Physical Medicine Rehab   Complete by: As directed    Moderate complexity follow-up 1 to 2 weeks right thalamic CVA       Follow-up Information    Meredith Staggers, MD Follow up.   Specialty: Physical Medicine and Rehabilitation Why: Office to call for appointment Contact information: [address] [place] [address] [phone]               Signed: Cathlyn Parsons 03/23/2020, 7:36 AM

## 2020-03-23 ENCOUNTER — Other Ambulatory Visit (HOSPITAL_COMMUNITY): Payer: Self-pay | Admitting: Physical Medicine and Rehabilitation

## 2020-03-23 ENCOUNTER — Encounter (HOSPITAL_COMMUNITY): Payer: Medicaid Other | Admitting: Occupational Therapy

## 2020-03-23 ENCOUNTER — Inpatient Hospital Stay (HOSPITAL_COMMUNITY): Payer: Medicaid Other

## 2020-03-23 ENCOUNTER — Ambulatory Visit (HOSPITAL_COMMUNITY): Payer: Medicaid Other

## 2020-03-23 LAB — BASIC METABOLIC PANEL
Anion gap: 8 (ref 5–15)
BUN: 14 mg/dL (ref 6–20)
CO2: 22 mmol/L (ref 22–32)
Calcium: 8.8 mg/dL — ABNORMAL LOW (ref 8.9–10.3)
Chloride: 110 mmol/L (ref 98–111)
Creatinine, Ser: 0.89 mg/dL (ref 0.44–1.00)
GFR, Estimated: >60 mL/min (ref >60)
Glucose, Bld: 86 mg/dL (ref 70–99)
Potassium: 3.9 mmol/L (ref 3.5–5.1)
Sodium: 140 mmol/L (ref 135–145)

## 2020-03-23 LAB — CBC WITH DIFFERENTIAL/PLATELET
Abs Immature Granulocytes: 0.02 10*3/uL (ref 0.00–0.07)
Basophils Absolute: 0.1 10*3/uL (ref 0.0–0.1)
Basophils Relative: 1 %
Eosinophils Absolute: 0.2 10*3/uL (ref 0.0–0.5)
Eosinophils Relative: 2 %
HCT: 43 % (ref 36.0–46.0)
Hemoglobin: 14 g/dL (ref 12.0–15.0)
Immature Granulocytes: 0 %
Lymphocytes Relative: 26 %
Lymphs Abs: 2.2 10*3/uL (ref 0.7–4.0)
MCH: 26.6 pg (ref 26.0–34.0)
MCHC: 32.6 g/dL (ref 30.0–36.0)
MCV: 81.7 fL (ref 80.0–100.0)
Monocytes Absolute: 0.5 10*3/uL (ref 0.1–1.0)
Monocytes Relative: 5 %
Neutro Abs: 5.4 10*3/uL (ref 1.7–7.7)
Neutrophils Relative %: 66 %
Platelets: 240 10*3/uL (ref 150–400)
RBC: 5.26 MIL/uL — ABNORMAL HIGH (ref 3.87–5.11)
RDW: 15 % (ref 11.5–15.5)
WBC: 8.3 10*3/uL (ref 4.0–10.5)
nRBC: 0 % (ref 0.0–0.2)

## 2020-03-23 MED ORDER — ATORVASTATIN CALCIUM 80 MG PO TABS: 80.0000 mg | ORAL_TABLET | Freq: Every day | ORAL | 0 refills | Status: DC

## 2020-03-23 MED ORDER — AMLODIPINE BESYLATE 5 MG PO TABS: 5.0000 mg | ORAL_TABLET | Freq: Every day | ORAL | 0 refills | Status: DC

## 2020-03-23 MED ORDER — CLOPIDOGREL BISULFATE 75 MG PO TABS: 75.0000 mg | ORAL_TABLET | Freq: Every day | ORAL | 0 refills | Status: DC

## 2020-03-23 MED ORDER — ESCITALOPRAM OXALATE 20 MG PO TABS: 20.0000 mg | ORAL_TABLET | Freq: Every day | ORAL | 0 refills | Status: DC

## 2020-03-23 MED ORDER — LIDOCAINE 5 % EX PTCH: 1.0000 | MEDICATED_PATCH | CUTANEOUS | 0 refills | Status: DC

## 2020-03-23 MED ORDER — GABAPENTIN 300 MG PO CAPS: 300.0000 mg | ORAL_CAPSULE | Freq: Three times a day (TID) | ORAL | 0 refills | Status: DC

## 2020-03-23 MED ORDER — TRAZODONE HCL 50 MG PO TABS: 50.0000 mg | ORAL_TABLET | Freq: Every day | ORAL | 0 refills | Status: DC

## 2020-03-23 MED ORDER — ASPIRIN 325 MG PO TBEC: 325.0000 mg | DELAYED_RELEASE_TABLET | Freq: Every day | ORAL | 0 refills | Status: DC

## 2020-03-23 MED ORDER — OXYCODONE HCL 5 MG PO TABS
5.0000 mg | ORAL_TABLET | Freq: Two times a day (BID) | ORAL | 0 refills | Status: DC | PRN
Start: 2020-03-23 — End: 2020-05-15

## 2020-03-23 MED ORDER — ALPRAZOLAM 0.25 MG PO TABS: 0.2500 mg | ORAL_TABLET | Freq: Every day | ORAL | 0 refills | Status: DC | PRN

## 2020-03-23 MED ORDER — TOPIRAMATE 25 MG PO TABS: 25.0000 mg | ORAL_TABLET | Freq: Every day | ORAL | 0 refills | Status: DC

## 2020-03-23 MED FILL — TOPIRAMATE 25 MG TABLET: 25 | 30 days supply | Qty: 30 | Fill #0

## 2020-03-23 MED FILL — ASPIRIN EC 325 MG TABLET: 325 | 100 days supply | Qty: 100 | Fill #0

## 2020-03-23 MED FILL — ALPRAZolam 0.25 MG TABS: 0.25 | 30 days supply | Qty: 30 | Fill #0

## 2020-03-23 MED FILL — AMLODIPINE BESYLATE 5 MG TA: 5 | 30 days supply | Qty: 30 | Fill #0

## 2020-03-23 MED FILL — CLOPIDOGREL 75 MG TABLET: 75 | 30 days supply | Qty: 30 | Fill #0

## 2020-03-23 MED FILL — GABAPENTIN 300 MG CAPSULE: 300 | 20 days supply | Qty: 60 | Fill #0

## 2020-03-23 MED FILL — ATORVASTATIN CALCIUM 80 MG: 80 | 30 days supply | Qty: 30 | Fill #0

## 2020-03-23 MED FILL — ESCITALOPRAM 20 MG TABLET: 20 | 20 days supply | Qty: 20 | Fill #0

## 2020-03-23 MED FILL — oxyCODONE HCL 5 MG TABS: 5 | 5 days supply | Qty: 10 | Fill #0

## 2020-03-23 MED FILL — traZODone HCL 50 MG TABS: 50 | 30 days supply | Qty: 30 | Fill #0

## 2020-03-23 NOTE — Progress Notes (Signed)
Occupational Therapy Discharge Summary  Patient Details  Name: Joyce [name] MRN: [id number] Date of Birth: 1975/10/25     Patient has met 12 of 12 long term goals due to improved activity tolerance, improved balance, postural control, functional use of  LEFT upper and LEFT lower extremity, improved awareness and improved coordination.  Patient to discharge at overall Modified Independent level.  Patient's care partner is independent to provide the necessary physical assistance at discharge.    Reasons goals not met: na  Recommendation:  Patient will benefit from ongoing skilled OT services in outpatient setting to continue to advance functional skills in the area of BADL and iADL.  Equipment: No equipment provided  Reasons for discharge: treatment goals met  Patient/family agrees with progress made and goals achieved: Yes  OT Discharge Precautions/Restrictions  Precautions Precautions: Fall Precaution Comments: left side hemiparesis Restrictions Weight Bearing Restrictions: No   Pain Pain Assessment Pain Scale: 0-10 Pain Score: 0-No pain ADL ADL Eating: Independent Grooming: Independent Upper Body Bathing: Independent Where Assessed-Upper Body Bathing: Shower Lower Body Bathing: Modified independent Where Assessed-Lower Body Bathing: Shower Upper Body Dressing: Independent Where Assessed-Upper Body Dressing: Chair Lower Body Dressing: Independent Where Assessed-Lower Body Dressing: Chair Toileting: Independent Where Assessed-Toileting: Glass blower/designer: Programmer, applications Method: Counselling psychologist: Raised Counselling psychologist: Close supervision Social research officer, government Method: Heritage manager: Radio broadcast assistant, Grab bars Vision Baseline Vision/History: No visual deficits Patient Visual Report: No change from baseline Vision Assessment?: No apparent visual deficits Perception   Perception: Within Functional Limits Praxis Praxis: Intact Cognition Overall Cognitive Status: Within Functional Limits for tasks assessed Arousal/Alertness: Awake/alert Orientation Level: Oriented X4  Safety/Judgment: Appears intact  Sensation Sensation Light Touch: Impaired by gross assessment Hot/Cold: Appears Intact Additional Comments: Slightly diminished left lower extremity though accurately identifies light touch Coordination Gross Motor Movements are Fluid and Coordinated: No Fine Motor Movements are Fluid and Coordinated: No Coordination and Movement Description: improving coordination L UE (Simultaneous filing. User may not have seen previous data.) Finger Nose Finger Test: mild dysmetria left, WNL right Heel Shin Test: Cannot move L lower extremity through full range due to weakness 9 Hole Peg Test: R = 26 sec, L = 39 sec           box and blocks:  R = 60, L = 46                 dynavision (in stance, 2 min, whole screen)  R = 1.2 sec, L = 2.14 sec reaction time Motor  Motor Motor: Hemiplegia Motor - Discharge Observations: mild coordination deficits L UE Mobility  Bed Mobility Bed Mobility: Supine to Sit;Sit to Supine Supine to Sit: Independent Sit to Supine: Independent Transfers Sit to Stand: Independent Stand to Sit: Independent  Trunk/Postural Assessment  Postural Control Postural Control: Within Functional Limits  Balance Static Sitting Balance Static Sitting - Level of Assistance: 7: Independent Dynamic Sitting Balance Dynamic Sitting - Level of Assistance: 7: Independent Static Standing Balance Static Standing - Level of Assistance: 7: Independent Dynamic Standing Balance Dynamic Standing - Level of Assistance: 5: Stand by assistance Extremity/Trunk Assessment RUE Assessment RUE Assessment: Within Functional Limits LUE Assessment Passive Range of Motion (PROM) Comments: WFL Active Range of Motion (AROM) Comments: WFL General Strength Comments:  4/5 Brunstrum level for arm: Stage V Relative Independence from Synergy Brunstrum level for hand: Stage VI Isolated joint movements LUE Strength Left Hand Gross Grasp: Functional   Erline Levine  A Ronal Maybury 03/23/2020, 12:43 PM

## 2020-03-23 NOTE — Progress Notes (Signed)
Physical Therapy Discharge Summary  Patient Details  Name: Joyce [name] MRN: [id number] Date of Birth: 30-May-1976  Today's Date: 03/23/2020 PT Individual Time: [phone] and [id number] PT Individual Time Calculation (min): 57 min and 58 min   Patient has met 9 of 9 long term goals due to improved activity tolerance, improved balance, improved postural control and increased strength.  Patient to discharge at an ambulatory level Supervision.   Patient's significant other attended family education and is independent to provide the necessary physical assistance at discharge.  Reasons goals not met: NA  Recommendation:  Patient will benefit from ongoing skilled PT services in outpatient setting to continue to advance safe functional mobility, address ongoing impairments in strength, balance, ambulation, and minimize fall risk.  Equipment: RW  Reasons for discharge: treatment goals met and discharge from hospital  Patient/family agrees with progress made and goals achieved: Yes   Skilled Therapeutic Interventions: 1st Session:Pt received supine in bed and agrees to therapy. Supine to sit mod(I) with bed features. Stand pivot transfer to independent with increased time required. WC transport to gym for time management. Pt performs car transfer with verbal cues for safety and hand placement. Pt ascends/descends ramp with cues to increase gait speed and L step height to decrease risk for falls. Pt completes sit<>supine on mat table independently. Pt's fiance arrives for family ed. PT provides pt with stroke warning signs and education handout. Pt completes 12 steps with LHR and significantly increased time, but only requires supervision for cues on sequencing and encouragement to increase speed for improved efficiency and safety. Pt ambulates 120' back to room with fiance providing supervision and guarding on L side. Left seated in WC with all needs within reach.  2nd Session: Pt received  supine in bed and agrees to therapy. No complaint of pain. Supine to sit mod(I) with bed features. Pt educated on 6 Minute Walk Test and rationale for performing. Pt verbalizes understanding. Pt completes x2 trials of 6 Minute Walk Test, initially without AD and then with use of RW.   Without AD: 22' with several brief standing rest breaks. Demos toe drag throughout that worsens with fatigue. With RW: 344' with 1-2 standing rest breaks. Intermittent toe drag.  Pt requires extended rest breaks following each bout of ambulation and verbalizes significant fatigue. Vitals taken with O2 sats 100% and HR~100 immediately following 6 minute walk test.   PT provides pt with HEP and answers questions regarding performance. Pt ambulates back to room with RW. Left seated at EOB with all needs within reach.  PT Discharge Precautions/Restrictions Precautions Precautions: Fall Precaution Comments: left side hemiparesis Restrictions Weight Bearing Restrictions: No Vision/Perception  Perception Perception: Within Functional Limits Praxis Praxis: Intact  Cognition Overall Cognitive Status: Within Functional Limits for tasks assessed (Simultaneous filing. User may not have seen previous data.) Arousal/Alertness: Awake/alert (Simultaneous filing. User may not have seen previous data.) Orientation Level: Oriented X4 (Simultaneous filing. User may not have seen previous data.) Safety/Judgment: Appears intact (Simultaneous filing. User may not have seen previous data.) Sensation Sensation Light Touch: Impaired by gross assessment Hot/Cold: Appears Intact Additional Comments: Slightly diminished left lower extremity though accurately identifies light touch Coordination Gross Motor Movements are Fluid and Coordinated: No Fine Motor Movements are Fluid and Coordinated: No Coordination and Movement Description: improving coordination L UE (Simultaneous filing. User may not have seen previous data.) Finger  Nose Finger Test: mild dysmetria left, WNL right Heel Shin Test: Cannot move L lower extremity through full range due  to weakness 9 Hole Peg Test: R = 26 sec, L = 39 sec           box and blocks:  R = 60, L = 46                 dynavision (in stance, 2 min, whole screen)  R = 1.2 sec, L = 2.14 sec reaction time Motor  Motor Motor: Hemiplegia Motor - Discharge Observations: Tremulous but imrpoved from evaluation  Mobility Bed Mobility Bed Mobility: Supine to Sit;Sit to Supine Supine to Sit: Independent Sit to Supine: Independent Transfers Transfers: Sit to Stand;Stand to [employer] Transfers Stand to Sit: Independent Stand Pivot Transfers: Independent Transfer (Assistive device): None Locomotion  Gait Ambulation: Yes Gait Assistance: Supervision/Verbal cueing Gait Distance (Feet): 200 Feet Assistive device: None Gait Assistance Details: Verbal cues for technique;Verbal cues for precautions/safety;Verbal cues for gait pattern Gait Gait: Yes Gait Pattern: Impaired Gait Pattern: Decreased weight shift to left;Decreased stance time - left;Step-to pattern Gait velocity: decreased Stairs / Additional Locomotion Stairs: Yes Stairs Assistance: Supervision/Verbal cueing Stair Management Technique: One rail Left Number of Stairs: 12 Height of Stairs: 6 Ramp: Supervision/Verbal cueing Curb: Supervision/Verbal cueing  Trunk/Postural Assessment  Cervical Assessment Cervical Assessment: Within Functional Limits Thoracic Assessment Thoracic Assessment: Within Functional Limits Lumbar Assessment Lumbar Assessment: Within Functional Limits Postural Control Postural Control: Within Functional Limits  Balance Balance Balance Assessed: Yes Static Sitting Balance Static Sitting - Level of Assistance: 7: Independent Dynamic Sitting Balance Dynamic Sitting - Level of Assistance: 7: Independent Static Standing Balance Static Standing - Level of Assistance: 7: Independent Dynamic  Standing Balance Dynamic Standing - Balance Support: During functional activity Dynamic Standing - Level of Assistance: 5: Stand by assistance Extremity Assessment   RLE Assessment RLE Assessment: Within Functional Limits LLE Assessment LLE Assessment: Exceptions to [facility] General Strength Comments: Grossly 3/5    Breck Coons, PT, DPT 03/23/2020, 12:54 PM

## 2020-03-23 NOTE — Progress Notes (Signed)
[facility] PHYSICAL MEDICINE & REHABILITATION PROGRESS NOTE   Subjective/Complaints: Lying in bed. No new complaints. Happy with progress. Pain controlled. Excited about dc tomorrow. Anxiety less  ROS: Patient denies fever, rash, sore throat, blurred vision, nausea, vomiting, diarrhea, cough, shortness of breath or chest pain, joint or back pain, headache, or mood change.    Objective:   No results found. Recent Labs    03/23/20 0827  WBC 8.3  HGB 14.0  HCT 43.0  PLT 240   Recent Labs    03/23/20 0827  NA 140  K 3.9  CL 110  CO2 22  GLUCOSE 86  BUN 14  CREATININE 0.89  CALCIUM 8.8*    Intake/Output Summary (Last 24 hours) at 03/23/2020 1218 Last data filed at 03/23/2020 0800 Gross per 24 hour  Intake 720 ml  Output --  Net 720 ml        Physical Exam: Vital Signs Blood pressure 117/68, pulse 71, temperature 98.2 F (36.8 C), resp. rate 18, height 5\' 6"  (1.676 m), weight 103.3 kg, SpO2 100 %.  Constitutional: No distress . Vital signs reviewed. HEENT: EOMI, oral membranes moist Neck: supple Cardiovascular: RRR without murmur. No JVD    Respiratory/Chest: CTA Bilaterally without wheezes or rales. Normal effort    GI/Abdomen: BS +, non-tender, non-distended Ext: no clubbing, cyanosis, or edema Psych: pleasant and cooperative Skin: CDI Musc: No edema in extremities.  No tenderness in extremities. Neuro: Alert Motor: LUE: 4/5 proximal distal RLE- HF, KE, DF and PF 5/5 LLE: HF  3+/5, KE 3/5, DF 3+/5, PF 3+/5   Assessment/Plan: 1. Functional deficits secondary to left hemiparesis which require 3+ hours per day of interdisciplinary therapy in a comprehensive inpatient rehab setting.  Physiatrist is providing close team supervision and 24 hour management of active medical problems listed below.  Physiatrist and rehab team continue to assess barriers to discharge/monitor patient progress toward functional and medical goals  Care Tool:  Bathing    Body  parts bathed by patient: Right arm, Left arm, Chest, Abdomen, Front perineal area, Buttocks, Right upper leg, Left upper leg, Right lower leg, Left lower leg, Face         Bathing assist Assist Level: Set up assist     Upper Body Dressing/Undressing Upper body dressing   What is the patient wearing?: Pull over shirt    Upper body assist Assist Level: Independent    Lower Body Dressing/Undressing Lower body dressing      What is the patient wearing?: Pants, Underwear/pull up     Lower body assist Assist for lower body dressing: Supervision/Verbal cueing     Toileting Toileting    Toileting assist Assist for toileting: Supervision/Verbal cueing     Transfers Chair/bed transfer  Transfers assist     Chair/bed transfer assist level: Supervision/Verbal cueing     Locomotion Ambulation   Ambulation assist      Assist level: Supervision/Verbal cueing Assistive device: No Device Max distance: 285 ft   Walk 10 feet activity   Assist     Assist level: Supervision/Verbal cueing Assistive device: No Device   Walk 50 feet activity   Assist Walk 50 feet with 2 turns activity did not occur: Safety/medical concerns  Assist level: Supervision/Verbal cueing Assistive device: No Device    Walk 150 feet activity   Assist Walk 150 feet activity did not occur: Safety/medical concerns  Assist level: Supervision/Verbal cueing Assistive device: No Device    Walk 10 feet on uneven surface  activity   Assist     Assist level: Contact Guard/Touching assist Assistive device: Aeronautical engineer Will patient use wheelchair at discharge?: No             Wheelchair 50 feet with 2 turns activity    Assist            Wheelchair 150 feet activity     Assist           Medical Problem List and Plan: 1.  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   Antithrombotics: -DVT/anticoagulation: SCDs             -antiplatelet therapy: Aspirin 325 mg daily and Plavix 75 mg daily x1 month then aspirin alone  -cbc WNL 10/25   3. Pain Management: Lidoderm patch, oxycodone as needed- has migraines'hx of panic attacks.   -10/14: gabapentin 300 TID added for trigeminal neuralgia.  Controlled with meds on 10/25 4. Mood:               -antipsychotic agents: N/A  -appreciate neuropsych evaluation and recs  Low dose xanax---continue 0.25mg  qd   -lexapro   -continue with coping strategies as described by neuropsych   -team providing positive reinforcement   Controlled with meds on 10/25 5. Neuropsych: This patient is capable of making decisions on her own behalf.   6. Skin/Wound Care: Routine skin checks. 7. Fluids/Electrolytes/Nutrition: Routine in and outs  I personally reviewed all of the patient's labs today, and lab work is within normal limits.   8.  Migraine headaches.  Topamax 25 mg daily  See #3 9.    Hyperlipidemia: Lipitor 10.  Essential hypertension:  Home amlodipine dose was restarted.   Controlled on 10/24 11. Chest pain on 10/12: Resolved   EKG was obtained and showed nonspecific ST elevation. Nitroglycerin and oxygen were administered. Episode resolved.   -suspect this was anxiety related 12. Low albumin: supplement initiated 10/13.     -eating well  LOS: 13 days A FACE TO [place] 03/23/2020, 12:18 PM

## 2020-03-23 NOTE — Progress Notes (Signed)
Patient ID: Joyce [name], female   DOB: Apr 07, 1976, 44 y.o.   MRN: [id number]  Pt set up for [employer] medication assistance program. SW informed pt that Rx will be sent to [facility] due to certain medications that may cost more at another pharmacy. SW reviewed d/c with patient. Pt encouraged to follow--up with [place] to inquire about Medicaid status.   Loralee Pacas, MSW, [place] Office: [phone] Cell: [phone] Fax: [phone]

## 2020-03-23 NOTE — Progress Notes (Signed)
Occupational Therapy Session Note  Patient Details  Name: Joyce [name] MRN: [id number] Date of Birth: [birth year]  Today's Date: 03/23/2020 OT Individual Time: 1000-1055 OT Individual Time Calculation (min): 55 min    Short Term Goals: Week 2:  OT Short Term Goal 1 (Week 2): STGs = LTGs of Mod independence  Skilled Therapeutic Interventions/Progress Updates:    Patient seated edge of bed, fiance present for session.   She denies pain and is ready for therapy session.  ADL/shower completed with set up/mod I to gather clothing, complete bathing seated on shower seat, dressing and toileting.  Reviewed safety and set up of environment with fiance - he verbalizes understanding.  Reviewed functional mobility without AD and recommendation for supervision to ensure safety, basic HM and DME.  Completed reassessment for discharge as documented.  Reviewed written UE HEP with good understanding demonstrated.  She returned to bed at close of session mod I.  Both patient and fiance verbalize readiness and preparedness for discharge to home tomorrow.    Therapy Documentation Precautions:  Precautions Precautions: Fall Precaution Comments: left side hemiparesis Restrictions Weight Bearing Restrictions: No  Therapy/Group: Individual Therapy  Carlos Levering 03/23/2020, 7:35 AM

## 2020-03-24 NOTE — Discharge Instructions (Signed)
Inpatient Rehab Discharge Instructions  [facility] Discharge date and time: 03/24/20   Activities/Precautions/ Functional Status: Activity: activity as tolerated Diet: regular diet Wound Care: Routine skin checks   Functional status:  ___ No restrictions     ___ Walk up steps independently ___ 24/7 supervision/assistance   ___ Walk up steps with assistance _X__ Intermittent supervision/assistance  __X_ Bathe/dress supervision  ___ Walk with walker     ___ Bathe/dress with assistance ___ Walk Independently    ___ Shower independently ___ Walk with assistance    ___ Shower with assistance ___ No alcohol      ___ Return to work/school _to be decided on follow up    [place]:      GENERAL COMMUNITY RESOURCES FOR PATIENT/FAMILY: Hospital Follow-up appointment with Vassie Moment on Monday, November 1 at 11:30am [facility] [place]  [place], [place] [address] [phone] *If unable to make this appointment, please be sure to call and reschedule.*  Special Instructions: No driving smoking or alcohol    STROKE/TIA DISCHARGE INSTRUCTIONS SMOKING Cigarette smoking nearly doubles your risk of having a stroke & is the single most alterable risk factor  If you smoke or have smoked in the last 12 months, you are advised to quit smoking for your health.  Most of the excess cardiovascular risk related to smoking disappears within a year of stopping.  Ask you doctor about anti-smoking medications   Quit Line: 1-800-QUIT NOW  Free Smoking Cessation Classes [phone]  CHOLESTEROL Know your levels; limit fat & cholesterol in your diet  Lipid Panel     Component Value Date/Time   CHOL 142 03/05/2020 0456   TRIG 57 03/05/2020 0456   HDL 35 (L) 03/05/2020 0456   CHOLHDL 4.1 03/05/2020 0456   VLDL 11 03/05/2020 0456   LDLCALC 96 03/05/2020 0456   LDLCALC 85 02/15/2017 0855      Many  patients benefit from treatment even if their cholesterol is at goal.  Goal: Total Cholesterol (CHOL) less than 160  Goal:  Triglycerides (TRIG) less than 150  Goal:  HDL greater than 40  Goal:  LDL (LDLCALC) less than 100   BLOOD PRESSURE American Stroke Association blood pressure target is less that 120/80 mm/Hg  Your discharge blood pressure is:  BP: 128/79  Monitor your blood pressure  Limit your salt and alcohol intake  Many individuals will require more than one medication for high blood pressure  DIABETES (A1c is a blood sugar average for last 3 months) Goal HGBA1c is under 7% (HBGA1c is blood sugar average for last 3 months)  Diabetes: No known diagnosis of diabetes    Lab Results  Component Value Date   HGBA1C 5.6 03/05/2020     Your HGBA1c can be lowered with medications, healthy diet, and exercise.  Check your blood sugar as directed by your physician  Call your physician if you experience unexplained or low blood sugars.  PHYSICAL ACTIVITY/REHABILITATION Goal is 30 minutes at least 4 days per week  Activity: Increase activity slowly, Therapies: Physical Therapy: Home Health Return to work:   Activity decreases your risk of heart attack and stroke and makes your heart stronger.  It helps control your weight and blood pressure; helps you relax and can improve your mood.  Participate in a regular exercise program.  Talk with your doctor about the best form of exercise for you (dancing, walking, swimming, cycling).  DIET/WEIGHT Goal is  to maintain a healthy weight  Your discharge diet is:  Diet Order            Diet Heart Room service appropriate? Yes; Fluid consistency: Thin  Diet effective now                 liquids Your height is:    Your current weight is:   Your Body Mass Index (BMI) is:     Following the type of diet specifically designed for you will help prevent another stroke.  Your goal weight range is:    Your goal Body Mass Index (BMI) is  19-24.  Healthy food habits can help reduce 3 risk factors for stroke:  High cholesterol, hypertension, and excess weight.  RESOURCES Stroke/Support Group:  Call [phone]   STROKE EDUCATION PROVIDED/REVIEWED AND GIVEN TO PATIENT Stroke warning signs and symptoms How to activate emergency medical system (call 911). Medications prescribed at discharge. Need for follow-up after discharge. Personal risk factors for stroke. Pneumonia vaccine given:  Flu vaccine given:  My questions have been answered, the writing is legible, and I understand these instructions.  I will adhere to these goals & educational materials that have been provided to me after my discharge from the hospital.      My questions have been answered and I understand these instructions. I will adhere to these goals and the provided educational materials after my discharge from the hospital.  Patient/Caregiver Signature _______________________________ Date __________  Clinician Signature _______________________________________ Date __________  Please bring this form and your medication list with you to all your follow-up doctor's appointments.

## 2020-03-24 NOTE — Progress Notes (Signed)
Inpatient Rehabilitation Care Coordinator  Discharge Note  The overall goal for the admission was met for:   Discharge location: Yes. D/c to home with her s/o, and support from various family members.   Length of Stay: Yes. 13 days.   Discharge activity level: Yes. Mod I.   Home/community participation: Yes. Limited.   Services provided included: MD, RD, PT, OT, SLP, RN, CM, TR, Pharmacy, Neuropsych and SW  Financial Services: Other: Uninsured  Follow-up services arranged: Outpatient: August for outpatient PT and DME: Family purchased RW   Comments (or additional information): contact pt# [phone] or pt s/o [name] #[phone]  Patient/Family verbalized understanding of follow-up arrangements: Yes  Individual responsible for coordination of the follow-up plan: Pt to have assistance with coordinating care needs.   Confirmed correct DME delivered:  A  03/24/2020     , MSW, LCSWA Office: [phone] Cell: [phone] Fax: [phone] 

## 2020-03-26 ENCOUNTER — Telehealth: Payer: Self-pay | Admitting: *Deleted

## 2020-03-26 NOTE — Telephone Encounter (Signed)
Contacted patient for Transitional Care Call.  Patient discharged from [facility] to her home on 03/24/2020 with significant other  Transitional care appointment has scheduling conflict on November 4th.  Rescheduled with Danella Sensing, ANP on 04/03/2020 at 10:20am.   1. Are you/is patient experiencing any problems since coming home? Back pain, feeling tight in chest muscles, shortness of breath.  I urged patient to monitor closely and to contact EMS immediately if condition worsens.  Are there any questions regarding any aspect of care? No  2. Are there any questions regarding medications administration/dosing? No Are meds being taken as prescribed?  Yes Patient should review meds with caller to confirm  3. Have there been any falls? No  4. Has Home Health been to the house and/or have they contacted you? No home health, straight to outpatient therapies  If not, have you tried to contact them? Can we help you contact them?  5. Are bowels and bladder emptying properly? Yes  Are there any unexpected incontinence issues? No If applicable, is patient following bowel/bladder programs? 6. Any fevers, problems with breathing, unexpected pain? No fevers, some sob, Low back pain  7. Are there any skin problems or new areas of breakdown? No  8. Has the patient/family member arranged specialty MD follow up (ie cardiology/neurology/renal/surgical/etc)? Yes Can we help arrange?  9. Does the patient need any other services or support that we can help arrange? No  10. Are caregivers following through as expected in assisting the patient? Yes  11. Has the patient quit smoking, drinking alcohol, or using drugs as recommended? Patient states that she does not do any of these  Appointment time 10:20 am, arrive time 10:00 am and who it is with Danella Sensing, ANP at [facility]

## 2020-03-27 ENCOUNTER — Encounter (HOSPITAL_COMMUNITY): Payer: Self-pay

## 2020-03-27 ENCOUNTER — Emergency Department (HOSPITAL_COMMUNITY)
Admission: EM | Admit: 2020-03-27 | Discharge: 2020-03-28 | Disposition: A | Discharge status: Home / Self Care | Payer: Medicaid Other | Attending: Emergency Medicine | Admitting: Emergency Medicine

## 2020-03-27 ENCOUNTER — Other Ambulatory Visit: Payer: Self-pay

## 2020-03-27 ENCOUNTER — Emergency Department (HOSPITAL_COMMUNITY): Payer: Medicaid Other

## 2020-03-27 DIAGNOSIS — Z79899 Other long term (current) drug therapy: Secondary | ICD-10-CM | POA: Diagnosis not present

## 2020-03-27 DIAGNOSIS — Z7982 Long term (current) use of aspirin: Secondary | ICD-10-CM | POA: Diagnosis not present

## 2020-03-27 DIAGNOSIS — R064 Hyperventilation: Secondary | ICD-10-CM | POA: Diagnosis not present

## 2020-03-27 DIAGNOSIS — Z7902 Long term (current) use of antithrombotics/antiplatelets: Secondary | ICD-10-CM | POA: Diagnosis not present

## 2020-03-27 DIAGNOSIS — I10 Essential (primary) hypertension: Secondary | ICD-10-CM | POA: Insufficient documentation

## 2020-03-27 DIAGNOSIS — R000 Tachycardia, unspecified: Secondary | ICD-10-CM | POA: Diagnosis not present

## 2020-03-27 DIAGNOSIS — R079 Chest pain, unspecified: Secondary | ICD-10-CM | POA: Diagnosis not present

## 2020-03-27 DIAGNOSIS — R0689 Other abnormalities of breathing: Secondary | ICD-10-CM | POA: Diagnosis not present

## 2020-03-27 DIAGNOSIS — R0789 Other chest pain: Secondary | ICD-10-CM | POA: Diagnosis not present

## 2020-03-27 LAB — TROPONIN I (HIGH SENSITIVITY): Troponin I (High Sensitivity): 3 ng/L (ref <18)

## 2020-03-27 LAB — CBC WITH DIFFERENTIAL/PLATELET
Abs Immature Granulocytes: 0.05 10*3/uL (ref 0.00–0.07)
Basophils Absolute: 0.1 10*3/uL (ref 0.0–0.1)
Basophils Relative: 0 %
Eosinophils Absolute: 0.1 10*3/uL (ref 0.0–0.5)
Eosinophils Relative: 1 %
HCT: 45.4 % (ref 36.0–46.0)
Hemoglobin: 14.6 g/dL (ref 12.0–15.0)
Immature Granulocytes: 0 %
Lymphocytes Relative: 14 %
Lymphs Abs: 2 10*3/uL (ref 0.7–4.0)
MCH: 26.6 pg (ref 26.0–34.0)
MCHC: 32.2 g/dL (ref 30.0–36.0)
MCV: 82.7 fL (ref 80.0–100.0)
Monocytes Absolute: 0.6 10*3/uL (ref 0.1–1.0)
Monocytes Relative: 4 %
Neutro Abs: 11.8 10*3/uL — ABNORMAL HIGH (ref 1.7–7.7)
Neutrophils Relative %: 81 %
Platelets: 287 10*3/uL (ref 150–400)
RBC: 5.49 MIL/uL — ABNORMAL HIGH (ref 3.87–5.11)
RDW: 15 % (ref 11.5–15.5)
WBC: 14.5 10*3/uL — ABNORMAL HIGH (ref 4.0–10.5)
nRBC: 0 % (ref 0.0–0.2)

## 2020-03-27 LAB — PROTIME-INR
INR: 1.1 (ref 0.8–1.2)
Prothrombin Time: 13.3 seconds (ref 11.4–15.2)

## 2020-03-27 LAB — COMPREHENSIVE METABOLIC PANEL
ALT: 26 U/L (ref 0–44)
AST: 20 U/L (ref 15–41)
Albumin: 3.7 g/dL (ref 3.5–5.0)
Alkaline Phosphatase: 54 U/L (ref 38–126)
Anion gap: 8 (ref 5–15)
BUN: 13 mg/dL (ref 6–20)
CO2: 22 mmol/L (ref 22–32)
Calcium: 8.9 mg/dL (ref 8.9–10.3)
Chloride: 108 mmol/L (ref 98–111)
Creatinine, Ser: 0.82 mg/dL (ref 0.44–1.00)
GFR, Estimated: >60 mL/min (ref >60)
Glucose, Bld: 110 mg/dL — ABNORMAL HIGH (ref 70–99)
Potassium: 3.3 mmol/L — ABNORMAL LOW (ref 3.5–5.1)
Sodium: 138 mmol/L (ref 135–145)
Total Bilirubin: 0.5 mg/dL (ref 0.3–1.2)
Total Protein: 7.5 g/dL (ref 6.5–8.1)

## 2020-03-27 LAB — MAGNESIUM: Magnesium: 1.8 mg/dL (ref 1.7–2.4)

## 2020-03-27 LAB — BRAIN NATRIURETIC PEPTIDE: B Natriuretic Peptide: 37 pg/mL (ref 0.0–100.0)

## 2020-03-27 IMAGING — DX DG CHEST 1V PORT
1 series · 1 of 1 positions shown · non-contrast
Comparison: Radiograph [DATE]

CLINICAL DATA: Chest pain, panic attack and fall

EXAM:
PORTABLE CHEST 1 VIEW

[chest ap]
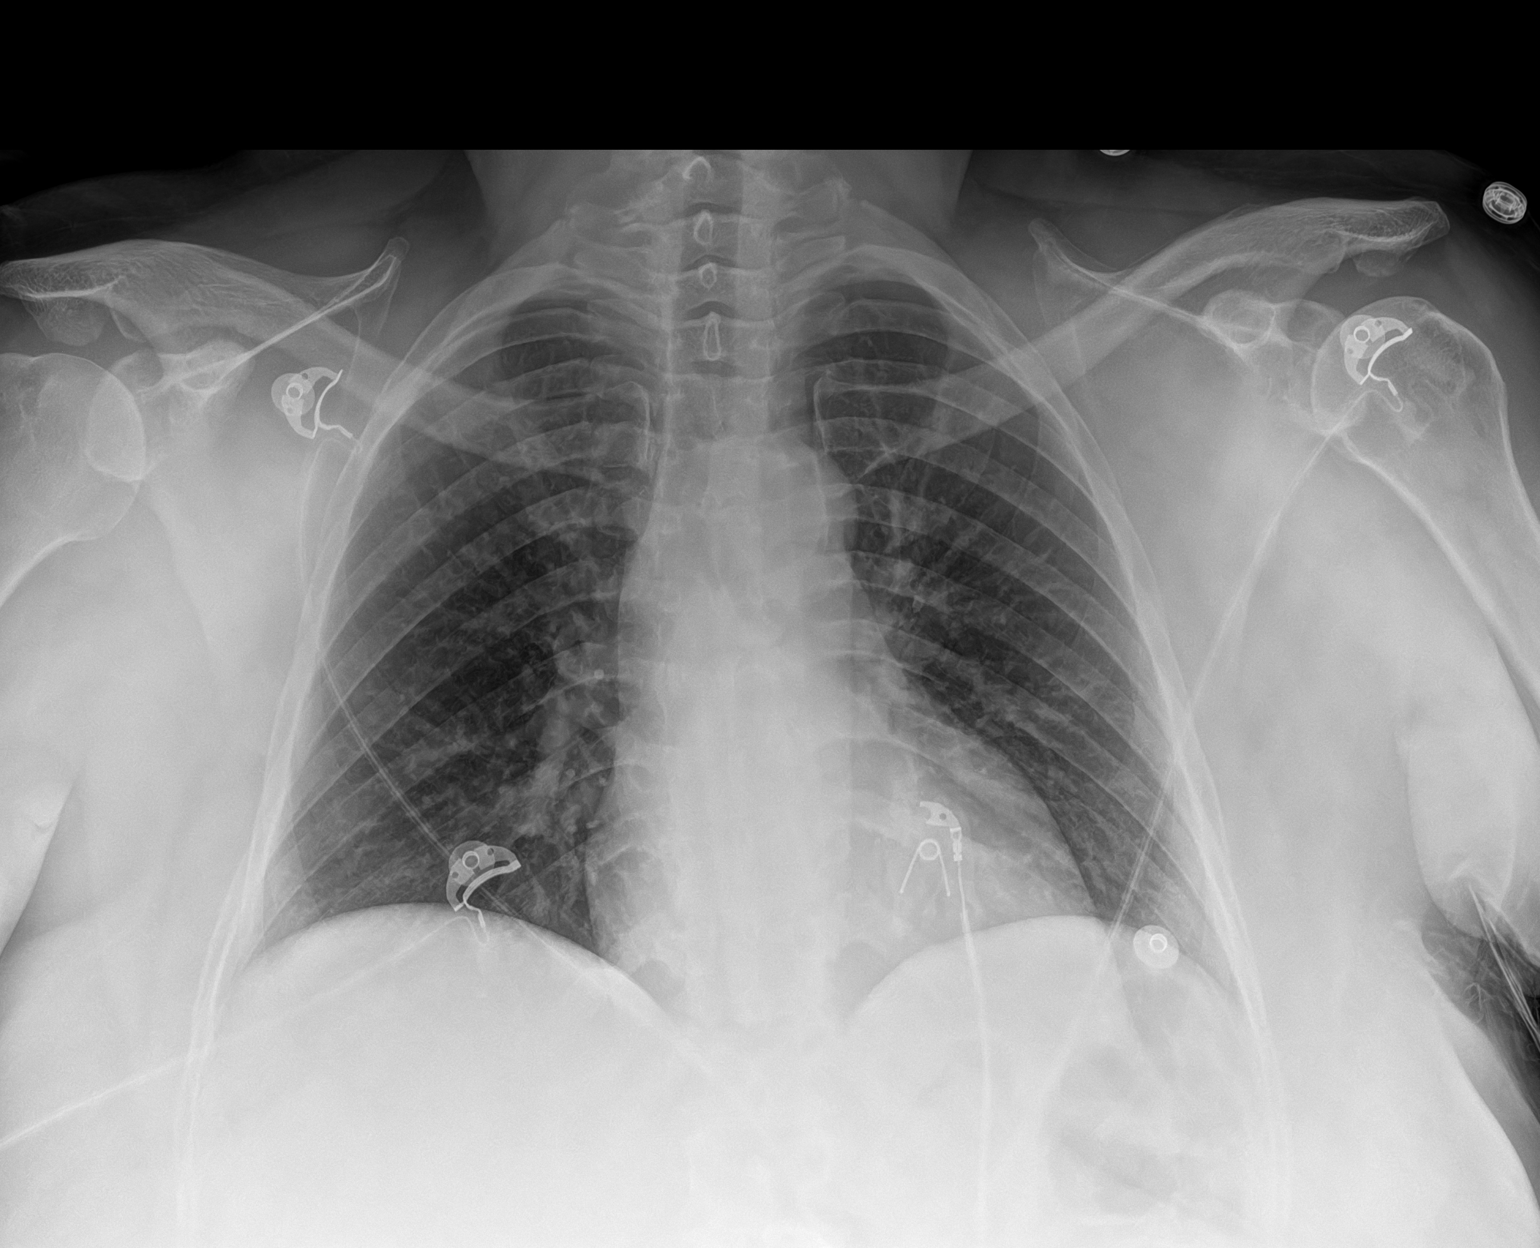

[1 of 1 positions shown; findings below may reference images not displayed]

FINDINGS: No consolidation, features of edema, pneumothorax, or effusion.
Pulmonary vascularity is normally distributed. The cardiomediastinal
contours are unremarkable. No acute osseous or soft tissue
abnormality.
IMPRESSION: No acute cardiopulmonary or traumatic findings in the chest.

## 2020-03-27 MED ORDER — ASPIRIN 81 MG PO CHEW
324.0000 mg | CHEWABLE_TABLET | Freq: Once | ORAL | Status: AC
  Administered 2020-03-27: 324 mg via ORAL
  Filled 2020-03-27: qty 4

## 2020-03-27 MED ORDER — LORAZEPAM 2 MG/ML IJ SOLN
1.0000 mg | Freq: Once | INTRAMUSCULAR | Status: AC
  Administered 2020-03-27: 1 mg via INTRAVENOUS
  Filled 2020-03-27: qty 1

## 2020-03-27 NOTE — ED Provider Notes (Signed)
[facility] EMERGENCY DEPARTMENT Provider Note   CSN: [id number] Arrival date & time: 03/27/20  2113     History Chief Complaint  Patient presents with  . Panic Attack    chest pain    Joyce [name] is a 44 y.o. female.  HPI  Patient presents with pain. She notes that she recently returned home following admission for stroke, and today had a stressful event soon thereafter developed chest tightness and pressure. This was persistent until transport, and worsened upon arrival, when she states that she felt particularly anxious about being in the hospital, emergency department. Prior to the development of chest pain she was recovering unremarkably at home. She has been taking her medication as directed including aspirin, Plavix. She has history of CVA as above, but no history of cardiac disease.   Past Medical History:  Diagnosis Date  . Acute CVA (cerebrovascular accident) ([place]) 03/04/2020  . AMA (advanced maternal age) multigravida 35+ 11/12/2014  . Anemia   . Depression   . Hypertension   . Migraine   . Pregnant 11/12/2014  . PTSD (post-traumatic stress disorder) 05/01/2017  . Round ligament pain 11/12/2014  . Short of breath on exertion 11/12/2014  . Trichomonas infection     Patient Active Problem List   Diagnosis Date Noted  . Anxiety state   . Dyslipidemia   . Labile blood pressure   . Migraine without status migrainosus, not intractable   . Right thalamic infarction ([place]) 03/10/2020  . Left hemiparesis ([place]) 03/10/2020  . Recurrent falls 03/05/2020  . Leukocytosis 03/05/2020  . Acute left-sided weakness 03/05/2020  . Acute CVA (cerebrovascular accident) ([place]) 03/04/2020  . MDD (major depressive disorder), recurrent episode, moderate ([place]) 05/01/2017  . PTSD (post-traumatic stress disorder) 05/01/2017  . Depression, recurrent ([place]) 12/07/2016  . Essential hypertension 07/20/2016  . Uterine fibroid 01/27/2016  . Cervical high risk HPV (human papillomavirus)  test positive 12/08/2014  . [place] multiparity 11/26/2014    Past Surgical History:  Procedure Laterality Date  . CHOLECYSTECTOMY       OB History    Gravida  11   Para  8   Term  8   Preterm  0   AB  3   Living  8     SAB  1   TAB  2   Ectopic      Multiple  0   Live Births  8           Family History  Problem Relation Age of Onset  . Diabetes Mother   . Hypertension Mother   . Kidney disease Mother   . Asthma Mother   . Hyperlipidemia Mother   . Alzheimer's disease Father   . Diabetes Father   . Kidney disease Sister   . Asthma Son   . Diabetes Sister   . Asthma Son   . ADD / ADHD Son   . Heart murmur Son   . Asthma Son     Social History   Tobacco Use  . Smoking status: Never Smoker  . Smokeless tobacco: Never Used  Vaping Use  . Vaping Use: Never used  Substance Use Topics  . Alcohol use: Never    Comment: occasionally  . Drug use: No    Home Medications Prior to Admission medications   Medication Sig Start Date End Date Taking? Authorizing Provider  ALPRAZolam (XANAX) 0.25 MG tablet Take 1 tablet (0.25 mg total) by mouth daily as needed for anxiety. 03/23/20  Love, Pamela S, PA-C  amLODipine (NORVASC) 5 MG tablet Take 1 tablet (5 mg total) by mouth daily. 03/23/20   Love, Ivan Anchors, PA-C  aspirin 325 MG EC tablet Take 1 tablet (325 mg total) by mouth daily. 03/23/20   Love, Ivan Anchors, PA-C  atorvastatin (LIPITOR) 80 MG tablet Take 1 tablet (80 mg total) by mouth daily. 03/23/20   Love, Ivan Anchors, PA-C  clopidogrel (PLAVIX) 75 MG tablet Take 1 tablet (75 mg total) by mouth daily with breakfast. 03/23/20   Love, Ivan Anchors, PA-C  escitalopram (LEXAPRO) 20 MG tablet Take 1 tablet (20 mg total) by mouth daily. 03/23/20   Love, Ivan Anchors, PA-C  gabapentin (NEURONTIN) 300 MG capsule Take 1 capsule (300 mg total) by mouth 3 (three) times daily. 03/23/20   Love, Ivan Anchors, PA-C  oxyCODONE (OXY IR/ROXICODONE) 5 MG immediate release tablet Take 1  tablet (5 mg total) by mouth every 12 (twelve) hours as needed for severe pain. 03/23/20   Love, Ivan Anchors, PA-C  topiramate (TOPAMAX) 25 MG tablet Take 1 tablet (25 mg total) by mouth daily. 03/23/20   Love, Ivan Anchors, PA-C  traZODone (DESYREL) 50 MG tablet Take 1 tablet (50 mg total) by mouth at bedtime. 03/23/20   Bary Leriche, PA-C    Allergies    Patient has no known allergies.  Review of Systems   Review of Systems  Constitutional:       Per HPI, otherwise negative  HENT:       Per HPI, otherwise negative  Respiratory:       Per HPI, otherwise negative  Cardiovascular:       Per HPI, otherwise negative  Gastrointestinal: Negative for vomiting.  Endocrine:       Negative aside from HPI  Genitourinary:       Neg aside from HPI   Musculoskeletal:       Per HPI, otherwise negative  Skin: Negative.   Neurological: Positive for weakness. Negative for syncope.       Left-sided weakness unchanged    Physical Exam Updated Vital Signs BP (!) 172/87 (BP Location: Left Arm)   Pulse (!) 120   Temp 97.7 F (36.5 C) (Oral)   Resp (!) 40   Ht 5\' 6"  (1.676 m)   Wt 103.3 kg   SpO2 100%   BMI 36.76 kg/m   Physical Exam Vitals and nursing note reviewed.  Constitutional:      General: She is not in acute distress.    Appearance: She is well-developed.  HENT:     Head: Normocephalic and atraumatic.  Eyes:     Conjunctiva/sclera: Conjunctivae normal.  Cardiovascular:     Rate and Rhythm: Normal rate and regular rhythm.  Pulmonary:     Effort: Pulmonary effort is normal. No respiratory distress.     Breath sounds: Normal breath sounds. No stridor.  Abdominal:     General: There is no distension.  Skin:    General: Skin is warm and dry.  Neurological:     Mental Status: She is alert and oriented to person, place, and time.     Cranial Nerves: No cranial nerve deficit.     Comments: Left upper and lower extremity weakness, minimal, patient notes no changes from new  baseline.     ED Results / Procedures / Treatments   Labs (all labs ordered are listed, but only abnormal results are displayed) Labs Reviewed  COMPREHENSIVE METABOLIC PANEL - Abnormal; Notable for the following  components:      Result Value   Potassium 3.3 (*)    Glucose, Bld 110 (*)    All other components within normal limits  CBC WITH DIFFERENTIAL/PLATELET - Abnormal; Notable for the following components:   WBC 14.5 (*)    RBC 5.49 (*)    Neutro Abs 11.8 (*)    All other components within normal limits  MAGNESIUM  BRAIN NATRIURETIC PEPTIDE  PROTIME-INR  CBG MONITORING, ED  POC URINE PREG, ED  TROPONIN I (HIGH SENSITIVITY)    EKG EKG Interpretation  Date/Time:  Friday March 27 2020 22:30:31 EDT Ventricular Rate:  106 PR Interval:    QRS Duration: 82 QT Interval:  340 QTC Calculation: 452 R Axis:   42 Text Interpretation: Sinus tachycardia Consider left ventricular hypertrophy Abnormal ECG Confirmed by Carmin Muskrat [id number]) on 03/27/2020 10:57:34 PM   Radiology DG Chest Portable 1 View  Result Date: 03/27/2020 CLINICAL DATA:  Chest pain, panic attack and fall EXAM: PORTABLE CHEST 1 VIEW COMPARISON:  Radiograph 08/17/2018 FINDINGS: No consolidation, features of edema, pneumothorax, or effusion. Pulmonary vascularity is normally distributed. The cardiomediastinal contours are unremarkable. No acute osseous or soft tissue abnormality. IMPRESSION: No acute cardiopulmonary or traumatic findings in the chest. Electronically Signed   By: Lovena Le M.D.   On: 03/27/2020 22:40    Procedures Procedures (including critical care time)  Medications Ordered in ED Medications  aspirin chewable tablet 324 mg (324 mg Oral Given 03/27/20 2219)  LORazepam (ATIVAN) injection 1 mg (1 mg Intravenous Given 03/27/20 2219)    ED Course  I have reviewed the triage vital signs and the nursing notes.  Pertinent labs & imaging results that were available during my care of the  patient were reviewed by me and considered in my medical decision making (see chart for details).    11:13 PM Patient calm, vital signs unremarkable, including no tachypnea, no tachycardia, no hypotension. I have reviewed the patient's labs, x-ray, EKG. Labs reassuring, no evidence for ongoing coronary ischemia, no substantial electrolyte abnormalities. X-ray reassuring, no pneumonia or other intrathoracic abnormalities, EKG with no arrhythmia, ischemia. On monitor the patient has sinus rhythm, rate 90, unremarkable, pulse oximetry is 100% on room air. We discussed today's presentation, concerning for atypical chest pain, possibly secondary to her circumstances, anxiousness, and given her improvement here following Ativan, absent any evidence for acute new pathology, given her endorsement of taking her medication including aspirin, Plavix for stroke prophylaxis, little suspicion for atypical ACS, no evidence for other acute new pathology, as above, patient appropriate for discharge with outpatient follow-up. MDM Rules/Calculators/A&P MDM Number of Diagnoses or Management Options Atypical chest pain: new, needed workup   Amount and/or Complexity of Data Reviewed Clinical lab tests: reviewed Tests in the radiology section of CPT: reviewed Tests in the medicine section of CPT: reviewed Decide to obtain previous medical records or to obtain history from someone other than the patient: yes Obtain history from someone other than the patient: yes Review and summarize past medical records: yes Independent visualization of images, tracings, or specimens: yes  Risk of Complications, Morbidity, and/or Mortality Presenting problems: high Diagnostic procedures: high Management options: high  Critical Care Total time providing critical care: < 30 minutes  Patient Progress Patient progress: stable   Final Clinical Impression(s) / ED Diagnoses Final diagnoses:  Atypical chest pain       Carmin Muskrat, MD 03/27/20 2316
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
## 2020-03-27 NOTE — ED Notes (Addendum)
Assistance requesting from RN who triaged pt, reports pt will not get into bed from wheelchair; entered pt room, pt leaned back in wheelchair, hyperventilating, this RN and another RN instructed patient to try to take slow breaths through her nose and to blow them out slowly through her mouth, also attempted to get pt into bed, pt refuses to assist to get out of wheelchair and into bed; advised pt we need her to get into bed to provide care and for the EDP to evaluate her; pt beings yelling that she just had a stroke and she cannot move her left side; pt reports she needs time to calm down; exited room with pt still in wheelchair as pt requested, call bed placed at pt's side

## 2020-03-27 NOTE — ED Notes (Signed)
Pt called out to desk and reports her lips feel numb

## 2020-03-27 NOTE — ED Notes (Signed)
EDP notified of pt reporting numbness to lips and requested to please see pt; EDP reports he will be to room shortly and have pt breath into brown paper bag
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
## 2020-03-27 NOTE — Discharge Instructions (Signed)
As discussed, your evaluation today has been largely reassuring.  But, it is important that you monitor your condition carefully, and do not hesitate to return to the ED if you develop new, or concerning changes in your condition. ? ?Otherwise, please follow-up with your physician for appropriate ongoing care. ? ?

## 2020-03-28 LAB — TROPONIN I (HIGH SENSITIVITY): Troponin I (High Sensitivity): 3 ng/L (ref <18)

## 2020-03-30 DIAGNOSIS — G43909 Migraine, unspecified, not intractable, without status migrainosus: Secondary | ICD-10-CM | POA: Diagnosis not present

## 2020-03-30 DIAGNOSIS — F419 Anxiety disorder, unspecified: Secondary | ICD-10-CM | POA: Diagnosis not present

## 2020-03-30 DIAGNOSIS — F339 Major depressive disorder, recurrent, unspecified: Secondary | ICD-10-CM | POA: Diagnosis not present

## 2020-03-30 DIAGNOSIS — I10 Essential (primary) hypertension: Secondary | ICD-10-CM | POA: Diagnosis not present

## 2020-03-31 ENCOUNTER — Ambulatory Visit: Payer: Medicaid Other

## 2020-04-01 ENCOUNTER — Ambulatory Visit (HOSPITAL_COMMUNITY): Payer: Medicaid Other | Attending: Physician Assistant | Admitting: Physical Therapy

## 2020-04-01 ENCOUNTER — Other Ambulatory Visit: Payer: Self-pay

## 2020-04-01 ENCOUNTER — Encounter (HOSPITAL_COMMUNITY): Payer: Self-pay | Admitting: Physical Therapy

## 2020-04-01 DIAGNOSIS — R262 Difficulty in walking, not elsewhere classified: Secondary | ICD-10-CM | POA: Diagnosis not present

## 2020-04-01 DIAGNOSIS — I639 Cerebral infarction, unspecified: Secondary | ICD-10-CM | POA: Diagnosis not present

## 2020-04-01 DIAGNOSIS — M6281 Muscle weakness (generalized): Secondary | ICD-10-CM | POA: Diagnosis not present

## 2020-04-01 NOTE — Patient Instructions (Signed)
Access Code: [email or web address] URL: [email or web address] Date: 04/01/2020 Prepared by: Yetta Glassman  Exercises seated foot raise - 3 x daily - 7 x weekly - 2 sets - 10 reps - 5 hold Seated Long Arc Quad - 2 x daily - 7 x weekly - 2 sets - 10 reps - 5 hold

## 2020-04-01 NOTE — Therapy (Signed)
[place], [place], [address] Phone: [phone]   Fax:  [phone]  Physical Therapy Evaluation  Patient Details  Name: Joyce [name] MRN: [id number] Date of Birth: 03/25/1976 Referring Provider (PT): Lauraine Rinne PA   Encounter Date: 04/01/2020   PT End of Session - 04/01/20 1200    Visit Number 1    Number of Visits 12    Date for PT Re-Evaluation 05/27/20    Authorization Type self -pay, getting medicaid- submit auth once medicaid approved    Progress Note Due on Visit 10    PT Start Time 1130    PT Stop Time 1209    PT Time Calculation (min) 39 min    Activity Tolerance Patient limited by fatigue    Behavior During Therapy [facility] for tasks assessed/performed           Past Medical History:  Diagnosis Date  . Acute CVA (cerebrovascular accident) ([place]) 03/04/2020  . AMA (advanced maternal age) multigravida 35+ 11/12/2014  . Anemia   . Depression   . Hypertension   . Migraine   . Pregnant 11/12/2014  . PTSD (post-traumatic stress disorder) 05/01/2017  . Round ligament pain 11/12/2014  . Short of breath on exertion 11/12/2014  . Trichomonas infection     Past Surgical History:  Procedure Laterality Date  . CHOLECYSTECTOMY      There were no vitals filed for this visit.    Subjective Assessment - 04/01/20 1151    Subjective States that she started having symptoms in her left foot then it progressed to her leg and stayed at work and then she started losing her balance but though she was fine. Then her hand started to feel numb and progressed to her arm. States she thought she was just tired so she went to sleep. States her kids woke up and she lost her balance and then she ended up driving herself to the hospital. States she fell in the ED and then passed out. States she is still having numbness in her hand and has occasional issues with speech and eyes. Reports she would like to walk without a  walker, which is what she was doing before, she would also like to get therapy for her left hand and arm. Reports she has a lot of family support at home. Continues to have numbness in both left hand and foot and this extends up the extremity.    Pertinent History HTN, Migraine, PTSD, Acute CVA 03/04/20    How long can you walk comfortably? 5-10 with rollator    Currently in Pain? Yes    Pain Score 6     Pain Location Back    Pain Orientation Mid    Pain Descriptors / Indicators Aching              [facility] PT Assessment - 04/01/20 0001      Assessment   Medical Diagnosis left hemiplegia    Referring Provider (PT) Lauraine Rinne PA    Prior Therapy yes at hospital      Precautions   Precautions Fall      Restrictions   Weight Bearing Restrictions No      Balance Screen   Has the patient fallen in the past 6 months Yes    How many times? 4    Has the patient had a decrease in activity level because of a fear of falling?  Yes    Is the patient  reluctant to leave their home because of a fear of falling?  No      Home Social worker Private residence    Living Arrangements Children;Spouse/significant other;Other relatives    Available Help at Discharge Family    Type of [place] to enter    Entrance Stairs-Number of Steps 4    Home Layout Two level    Alternate Level Stairs-Number of Steps 12    Alternate Level Stairs-Rails Left    Home Equipment Shower seat;Bedside commode;Grab bars - tub/shower;Toilet riser   rollator      Prior Function   Level of Independence Independent    Vocation Full time employment    Conservation officer, nature      Cognition   Overall Cognitive Status Within Functional Limits for tasks assessed      ROM / Strength   AROM / PROM / Strength Strength      Strength   Overall Strength Comments toe extension L 3+/5, R 5/5     Strength Assessment Site Ankle;Knee;Hip    Right/Left Hip  Right;Left    Right Hip Flexion 4+/5    Left Hip Flexion 2+/5    Right/Left Knee Right;Left    Right Knee Flexion --    Right Knee Extension 4/5    Left Knee Extension 2+/5    Right/Left Ankle Left;Right    Right Ankle Dorsiflexion 5/5    Left Ankle Dorsiflexion 3+/5      Transfers   Transfers Sit to Stand;Stand to Sit    Sit to Stand --   heavy use of arms   Stand to Sit --   heavy use of arms     Ambulation/Gait   Ambulation/Gait Yes    Ambulation/Gait Assistance 5: Supervision    Ambulation Distance (Feet) 68 Feet    Assistive device --   rollator   Gait Pattern Decreased dorsiflexion - left;Decreased hip/knee flexion - left;Decreased stance time - left;Decreased stride length;Left foot flat    Ambulation Surface Level;Indoor    Gait velocity decreased                      Objective measurements completed on examination: See above findings.       [facility] Adult PT Treatment/Exercise - 04/01/20 0001      Exercises   Exercises Knee/Hip      Knee/Hip Exercises: Seated   Long Arc Quad AROM;Left;5 reps;3 sets   partial ROM against gravity   Other Seated Knee/Hip Exercises DF Left x15 5" hold                   PT Education - 04/01/20 1238    Education Details on current condtion, HEP, POC and occupational therapy    Person(s) Educated Patient    Methods Explanation    Comprehension Verbalized understanding            PT Short Term Goals - 04/01/20 1231      PT SHORT TERM GOAL #1   Title Patient will report at least 25% improvement in overall symptoms and/or function to demonstrate improved functional mobility    Time 4    Period Weeks    Status New    Target Date 04/29/20      PT SHORT TERM GOAL #2   Title Patient will be independent in self management strategies to improve quality of life and functional outcomes.    Time  4    Period Weeks    Status New    Target Date 04/29/20      PT SHORT TERM GOAL #3   Title Patient will be able  to transition from sit to stand without use of upper extremities to improve transitional mobility    Time 4    Period Weeks    Status New    Target Date 04/29/20             PT Long Term Goals - 04/01/20 1231      PT LONG TERM GOAL #1   Title Patient will report at least 50% improvement in overall symptoms and/or function to demonstrate improved functional mobility    Time 8    Period Weeks    Status New    Target Date 05/27/20      PT LONG TERM GOAL #2   Title Patient will be able to ascend and descend stairs with use of railing and step through gait pattern to improve ability to go up to bedroom    Time 8    Period Weeks    Status New    Target Date 05/27/20      PT LONG TERM GOAL #3   Title Patient will be able to ambulate at least 226 feet in 2 minutes without use of Assistive device to improve ability to walk in community    Time 8    Period Weeks    Status New    Target Date 05/27/20                  Plan - 04/01/20 1234    Clinical Impression Statement Patient is s/p CVA on 03/04/20 which resulted in reduced sensation and strength on left side of her body. Patient with weakness that greatly decreases her ability to perform functional and transitional movements. Educated patient on current presentation and POC moving forward. Patient is young and highly motivated to return to work and take care of her large family. Patient would greatly benefit from skilled physical therapy to improve functional mobility and return her to optimal function.    Personal Factors and Comorbidities Comorbidity 1;Comorbidity 2    Comorbidities migraines, HTN    Examination-Activity Limitations Locomotion Level;Lift;Transfers;Toileting;Stand;Stairs;Squat;Carry;Bathing    Examination-Participation Restrictions Cleaning;Community Activity;Meal Prep;Shop;Occupation    Stability/Clinical Decision Making Stable/Uncomplicated    Clinical Decision Making Low    Rehab Potential Good    PT  Frequency --   1-2x/week for total of 12 visits over 8 week certification period   PT Duration 8 weeks    PT Treatment/Interventions ADLs/Self Care Home Management;Aquatic Therapy;Cryotherapy;Electrical Stimulation;Moist Heat;Traction;Balance training;Therapeutic exercise;Therapeutic activities;Functional mobility training;Stair training;Gait training;DME Instruction;Neuromuscular re-education;Patient/family education;Orthotic Fit/Training;Manual techniques;Energy conservation;Dry needling;Passive range of motion    PT Next Visit Plan LE strengthening - gravity elminated  - focus on muscle activation, walking mechanics    PT Home Exercise Plan LAQs, DF    Recommended Other Services occupational therapy - order sent    Consulted and Agree with Plan of Care Patient           Patient will benefit from skilled therapeutic intervention in order to improve the following deficits and impairments:  Decreased coordination, Improper body mechanics, Decreased range of motion, Decreased balance, Decreased mobility, Difficulty walking, Pain, Decreased strength, Decreased activity tolerance, Decreased knowledge of use of DME, Abnormal gait, Decreased endurance  Visit Diagnosis: Difficulty in walking, not elsewhere classified  Muscle weakness (generalized)     Problem List Patient Active  Problem List   Diagnosis Date Noted  . Anxiety state   . Dyslipidemia   . Labile blood pressure   . Migraine without status migrainosus, not intractable   . Right thalamic infarction ([place]) 03/10/2020  . Left hemiparesis ([place]) 03/10/2020  . Recurrent falls 03/05/2020  . Leukocytosis 03/05/2020  . Acute left-sided weakness 03/05/2020  . Acute CVA (cerebrovascular accident) ([place]) 03/04/2020  . MDD (major depressive disorder), recurrent episode, moderate ([place]) 05/01/2017  . PTSD (post-traumatic stress disorder) 05/01/2017  . Depression, recurrent ([place]) 12/07/2016  . Essential hypertension 07/20/2016  . Uterine  fibroid 01/27/2016  . 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    12:42 PM, 04/01/20 Jerene Pitch, DPT Physical Therapy with [facility]  [phone] office  [place] [address] [place], [place], [address] Phone: [phone]   Fax:  [phone]  Name: Joyce [name] MRN: [id number] Date of Birth: [birth year]

## 2020-04-02 ENCOUNTER — Encounter: Payer: Medicaid Other | Admitting: Registered Nurse

## 2020-04-03 ENCOUNTER — Encounter: Payer: Medicaid Other | Attending: Registered Nurse | Admitting: Registered Nurse

## 2020-04-03 ENCOUNTER — Encounter: Payer: Self-pay | Admitting: Registered Nurse

## 2020-04-03 ENCOUNTER — Other Ambulatory Visit: Payer: Self-pay

## 2020-04-03 VITALS — BP 153/89 | HR 77 | Temp 98.0°F | Ht 65.0 in | Wt 220.0 lb

## 2020-04-03 DIAGNOSIS — E785 Hyperlipidemia, unspecified: Secondary | ICD-10-CM | POA: Diagnosis not present

## 2020-04-03 DIAGNOSIS — G43009 Migraine without aura, not intractable, without status migrainosus: Secondary | ICD-10-CM | POA: Insufficient documentation

## 2020-04-03 DIAGNOSIS — I10 Essential (primary) hypertension: Secondary | ICD-10-CM | POA: Diagnosis not present

## 2020-04-03 DIAGNOSIS — I639 Cerebral infarction, unspecified: Secondary | ICD-10-CM

## 2020-04-03 DIAGNOSIS — I6381 Other cerebral infarction due to occlusion or stenosis of small artery: Secondary | ICD-10-CM

## 2020-04-03 DIAGNOSIS — F339 Major depressive disorder, recurrent, unspecified: Secondary | ICD-10-CM | POA: Insufficient documentation

## 2020-04-03 DIAGNOSIS — G8194 Hemiplegia, unspecified affecting left nondominant side: Secondary | ICD-10-CM

## 2020-04-03 NOTE — Progress Notes (Signed)
Subjective:    Patient ID: Joyce [name], female    DOB: 28-Jan-1976, 44 y.o.   MRN: [id number]  HPI: Joyce [name] is a 44 y.o. female who is here for Transitional Care visit, for follow up of her Right Thalamic Infarction, Left Hemiparesis, Essential Hypertension, Dyslipidemia, Depression and Migraine without status migrainous.  Joyce [name] presented to [facility] on 10/06 2021, with left sided numbness of upper and lower extremities.  Neurology was consulted.  CT Head WO Contrast:  IMPRESSION: 1. Normal head CT. US Carotid Bilateral:  IMPRESSION: Color duplex indicates minimal homogeneous plaque, with no hemodynamically significant stenosis by duplex criteria in the extracranial cerebrovascular circulation. MR Brain WO Contrast:  IMPRESSION: Acute subcentimeter infarct of the lateral right thalamus.  No proximal intracranial vessel occlusion or significant stenosis.  Joyce [name] will be maintained on aspirin and Plavix x 1 month followed by aspirin alone.   Joyce [name] was admitted to inpatient rehabilitation on 03/10/2020 and discharged home on 03/24/2020. She is receiving Outpatient therapy at [facility]. She states her pain is located in her left finger tips on 4th and 5th digit and left  foot pain she describes pain as  Tingling and numbness. Also reports lower back pain. She rates her pain 7.  Also reports her appetite is fair.   Joyce [name] reports she was outside sitting on her rollator when she lost her balanced and landed on her buttocks, EMS was called. ED note was reviewed.   Pain Inventory Average Pain 7 Pain Right Now 7 My pain is constant, tingling and aching  LOCATION OF PAIN fingers, toes & lower back on left side,  BOWEL Number of stools per week: 12 Oral laxative use No  Type of laxative- none Enema or suppository use No  History of colostomy No  Incontinent No   BLADDER Normal In and out cath, frequency  Able to self cath No  Bladder  incontinence No  Frequent urination No  Leakage with coughing No  Difficulty starting stream Yes  Incomplete bladder emptying No    Mobility walk with assistance use a walker ability to climb steps?  yes do you drive?  no  Function what is your job? Mar 04 2020 [employer]   Neuro/Psych weakness numbness tingling trouble walking anxiety  Prior Studies NEW PATIENT  Physicians involved in your care NEW PATIENT   Family History  Problem Relation Age of Onset  . Diabetes Mother   . Hypertension Mother   . Kidney disease Mother   . Asthma Mother   . Hyperlipidemia Mother   . Alzheimer's disease Father   . Diabetes Father   . Kidney disease Sister   . Asthma Son   . Diabetes Sister   . Asthma Son   . ADD / ADHD Son   . Heart murmur Son   . Asthma Son    Social History   Socioeconomic History  . Marital status: Significant Other    Spouse name: [name]  . Number of children: 8  . Years of education: 86  . Highest education level: Not on file  Occupational History  . Occupation: unemployed  Tobacco Use  . Smoking status: Never Smoker  . Smokeless tobacco: Never Used  Vaping Use  . Vaping Use: Never used  Substance and Sexual Activity  . Alcohol use: Not Currently    Comment: occasionally  . Drug use: No  . Sexual activity: Yes    Birth control/protection: Injection  Other  Topics Concern  . Not on file  Social History Narrative   Lives with 5 children   Joyce [name], his mother   Fiancee's two children   Social Determinants of Health   Financial Resource Strain:   . Difficulty of Paying Living Expenses: Not on file  Food Insecurity:   . Worried About Charity fundraiser in the Last Year: Not on file  . Ran Out of Food in the Last Year: Not on file  Transportation Needs:   . Lack of Transportation (Medical): Not on file  . Lack of Transportation (Non-Medical): Not on file  Physical Activity:   . Days of Exercise per Week: Not on file  .  Minutes of Exercise per Session: Not on file  Stress:   . Feeling of Stress : Not on file  Social Connections:   . Frequency of Communication with Friends and Family: Not on file  . Frequency of Social Gatherings with Friends and Family: Not on file  . Attends Religious Services: Not on file  . Active Member of Clubs or Organizations: Not on file  . Attends Archivist Meetings: Not on file  . Marital Status: Not on file   Past Surgical History:  Procedure Laterality Date  . CHOLECYSTECTOMY     Past Medical History:  Diagnosis Date  . Acute CVA (cerebrovascular accident) ([place]) 03/04/2020  . AMA (advanced maternal age) multigravida 35+ 11/12/2014  . Anemia   . Depression   . Hypertension   . Migraine   . Pregnant 11/12/2014  . PTSD (post-traumatic stress disorder) 05/01/2017  . Round ligament pain 11/12/2014  . Short of breath on exertion 11/12/2014  . Trichomonas infection    Temp 98 F (36.7 C)   Ht 5\' 5"  (1.651 m)   Wt 220 lb (99.8 kg)   BMI 36.61 kg/m   Opioid Risk Score:   Fall Risk Score:  `1  Depression screen PHQ 2/9  Depression screen [facility] 2/9 12/01/2017 02/15/2017 12/07/2016 07/20/2016 07/20/2016 07/13/2016 01/27/2016  Decreased Interest 0 0 0 1 1 2  0  Down, Depressed, Hopeless 2 0 0 3 3 2  0  PHQ - 2 Score 2 0 0 4 4 4  0  Altered sleeping 2 - - 0 - 0 0  Tired, decreased energy 3 - - 0 - 0 1  Change in appetite 0 - - 3 - 2 1  Feeling bad or failure about yourself  0 - - 3 - 1 0  Trouble concentrating 2 - - 3 - 2 0  Moving slowly or fidgety/restless 0 - - 0 - 0 0  Suicidal thoughts 0 - - 0 - 0 0  PHQ-9 Score 9 - - 13 - 9 2  Difficult doing work/chores Somewhat difficult - - - - Somewhat difficult -   Review of Systems  Musculoskeletal: Positive for gait problem.       Fingers & toes  All other systems reviewed and are negative.      Objective:   Physical Exam Vitals and nursing note reviewed.  Constitutional:      Appearance: Normal appearance.    Cardiovascular:     Rate and Rhythm: Normal rate and regular rhythm.     Pulses: Normal pulses.     Heart sounds: Normal heart sounds.  Pulmonary:     Effort: Pulmonary effort is normal.     Breath sounds: Normal breath sounds.  Musculoskeletal:     Cervical back: Normal range of motion and neck supple.  Comments: Normal Muscle Bulk and Muscle Testing Reveals:  Upper Extremities: Right: Full ROM and Muscle Strength 5/5 Left Upper Extremity: Decreased ROM 90 Degrees and Muscle Strength 3/5 Lumbar Paraspinal Tenderness: L-3-L-5 Lower Extremities: Right: Full ROM and Muscle Strength 5/5 Left Lower Extremity: Decreased ROM and Muscle Strength 4/5 Left Lower extremity Flexion Produces pain into her left lower extremity Arises from Table Slowly using walker for support Antalgic  Gait   Skin:    General: Skin is warm and dry.  Neurological:     Mental Status: She is alert and oriented to person, place, and time.  Psychiatric:        Mood and Affect: Mood normal.        Behavior: Behavior normal.           Assessment & Plan:  1.Right Thalamic Infarction/Left Hemiparesis: Continue outpatient therapy at [facility]. She has a scheduled appointment with Neurology. Continue to Monitor.  2. Essential Hypertension: Continue current medication regimen. PCP following.  3 Dyslipidemia: Continue current medication regimen. PCP following.  4. Depression: Continue current medication regimen. PCP Following.  5. Migraine without status migrainous: Continue Topamax. Continue to monitor.   20  minutes of face to face patient care time was spent during this visit. All questions were encouraged and answered.  F/U with Dr Naaman Plummer in 4-6 weeks  ,

## 2020-04-06 ENCOUNTER — Encounter: Payer: Self-pay | Admitting: Advanced Practice Midwife

## 2020-04-06 ENCOUNTER — Ambulatory Visit (INDEPENDENT_AMBULATORY_CARE_PROVIDER_SITE_OTHER): Payer: Medicaid Other | Admitting: Advanced Practice Midwife

## 2020-04-06 ENCOUNTER — Ambulatory Visit (HOSPITAL_COMMUNITY): Payer: Medicaid Other | Admitting: Physical Therapy

## 2020-04-06 ENCOUNTER — Other Ambulatory Visit (HOSPITAL_COMMUNITY): Payer: Self-pay | Admitting: Family Medicine

## 2020-04-06 ENCOUNTER — Other Ambulatory Visit: Payer: Self-pay

## 2020-04-06 VITALS — BP 124/74 | HR 89 | Ht 65.0 in | Wt 225.0 lb

## 2020-04-06 DIAGNOSIS — R262 Difficulty in walking, not elsewhere classified: Secondary | ICD-10-CM | POA: Diagnosis not present

## 2020-04-06 DIAGNOSIS — M6281 Muscle weakness (generalized): Secondary | ICD-10-CM | POA: Diagnosis not present

## 2020-04-06 DIAGNOSIS — Z3009 Encounter for other general counseling and advice on contraception: Secondary | ICD-10-CM

## 2020-04-06 DIAGNOSIS — Z1231 Encounter for screening mammogram for malignant neoplasm of breast: Secondary | ICD-10-CM

## 2020-04-06 DIAGNOSIS — I639 Cerebral infarction, unspecified: Secondary | ICD-10-CM | POA: Diagnosis not present

## 2020-04-06 NOTE — Addendum Note (Signed)
Addended by: Serita Grammes D on: 04/06/2020 03:08 PM   Modules accepted: Level of Service

## 2020-04-06 NOTE — Progress Notes (Signed)
   GYN VISIT Patient name: Joyce [name] MRN [id number]  Date of birth: 16-Aug-1975 Chief Complaint:   consult iud placement  History of Present Illness:   Joyce [name] is a 44 y.o. [id number] African American female being seen today for desire to change from DMPA to Paragard IUD due to recent acute CVA resulting in L hemiparesis on 03/05/20. Her PCP Dr Lavonia Drafts says it may have occurred due to a combination of cHTN with DMPA-use as she has been having migranes since starting DMPA in 2018. She is currently getting PT twice weekly and is improving in her L-sided functioning.  Depression screen [facility] 2/9 04/03/2020 12/01/2017 02/15/2017 12/07/2016 07/20/2016  Decreased Interest 2 0 0 0 1  Down, Depressed, Hopeless 3 2 0 0 3  PHQ - 2 Score 5 2 0 0 4  Altered sleeping 3 2 - - 0  Tired, decreased energy 2 3 - - 0  Change in appetite 2 0 - - 3  Feeling bad or failure about yourself  3 0 - - 3  Trouble concentrating 3 2 - - 3  Moving slowly or fidgety/restless 1 0 - - 0  Suicidal thoughts 0 0 - - 0  PHQ-9 Score 19 9 - - 13  Difficult doing work/chores - Somewhat difficult - - -    No LMP recorded. Patient has had an injection. The current method of family planning is last DMPA 10/15/19, but currently not sexually active since her CVA Last pap July 2019. Results were:  normal Review of Systems:   Pertinent items are noted in HPI Denies fever/chills, dizziness, headaches, visual disturbances, fatigue, shortness of breath, chest pain, abdominal pain, vomiting, abnormal vaginal discharge/itching/odor/irritation, problems with periods, bowel movements, urination, or intercourse unless otherwise stated above.  Pertinent History Reviewed:  Reviewed past medical,surgical, social, obstetrical and family history.  Reviewed problem list, medications and allergies. Physical Assessment:   Vitals:   04/06/20 1145  BP: 124/74  Pulse: 89  Weight: 225 lb (102.1 kg)  Height: 5\' 5"  (1.651 m)  Body mass index  is 37.44 kg/m.       Physical Examination:   General appearance: alert, well appearing, and in no distress  Mental status: alert, oriented to person, place, and time  Skin: warm & dry   Cardiovascular: normal heart rate noted  Respiratory: normal respiratory effort, no distress  Abdomen: soft, non-tender   Pelvic: examination not indicated  Extremities: no edema; using walker for support with ambulation  Chaperone: N/A    No results found for this or any previous visit (from the past 24 hour(s)).  Assessment & Plan:  1) Desires Paragard IUD> R&B reviewed; she will miss not having cycles as she didn't have vag bldg with DMPA; reviewed that the return of her cycles is hard to predict, but that they may be heavier than previously; no sex until her IUD is inserted   Meds: No orders of the defined types were placed in this encounter.   No orders of the defined types were placed in this encounter.   Return for IUD insertion-Paragard-with Manus Gunning or KB.  Myrtis Ser CNM 04/06/2020 12:26 PM

## 2020-04-06 NOTE — Patient Instructions (Signed)

## 2020-04-06 NOTE — Therapy (Signed)
[place], [place], [address] Phone: [phone]   Fax:  [phone]  Physical Therapy Treatment  Patient Details  Name: Joyce [name] MRN: [id number] Date of Birth: [birth year] Referring Provider (PT): Lauraine Rinne PA   Encounter Date: 04/06/2020   PT End of Session - 04/06/20 1048    Visit Number 2    Number of Visits 12    Date for PT Re-Evaluation 05/27/20    Authorization Type self -pay, getting medicaid- submit auth once medicaid approved    Progress Note Due on Visit 10    PT Start Time 0928    PT Stop Time 1010    PT Time Calculation (min) 42 min    Activity Tolerance Patient limited by fatigue    Behavior During Therapy [facility] for tasks assessed/performed           Past Medical History:  Diagnosis Date  . Acute CVA (cerebrovascular accident) ([place]) 03/04/2020  . AMA (advanced maternal age) multigravida 35+ 11/12/2014  . Anemia   . Depression   . Hypertension   . Migraine   . Pregnant 11/12/2014  . PTSD (post-traumatic stress disorder) 05/01/2017  . Round ligament pain 11/12/2014  . Short of breath on exertion 11/12/2014  . Trichomonas infection     Past Surgical History:  Procedure Laterality Date  . CHOLECYSTECTOMY      There were no vitals filed for this visit.   Subjective Assessment - 04/06/20 0940    Subjective pt states she had been doing some exercises at home.  STates she doesn't like the cold because it makes her mouth hurt.  STates her whole Lt side is still numb.    Currently in Pain? No/denies                             [facility] Adult PT Treatment/Exercise - 04/06/20 0001      Ambulation/Gait   Ambulation/Gait Yes    Ambulation/Gait Assistance 5: Supervision    Ambulation/Gait Assistance Details intermittent Lt LE shuffling, early fatigue with heavy breathing    Ambulation Distance (Feet) 150 Feet    Assistive device Rolling walker    Gait Pattern  Decreased dorsiflexion - left;Decreased hip/knee flexion - left;Decreased stance time - left;Decreased stride length;Left foot flat    Ambulation Surface Level;Indoor    Gait velocity decreased      Knee/Hip Exercises: Seated   Long Arc Quad AROM;Left;5 reps;2 sets    Other Seated Knee/Hip Exercises DF Left x15 5" hold     Marching Both;10 reps    Sit to Sand 5 reps;without UE support      Knee/Hip Exercises: Supine   Bridges 5 sets                  PT Education - 04/06/20 1036    Education Details Encouraged to increase activity, use leg mm to move lt leg, not UE's    Person(s) Educated Patient    Methods Explanation    Comprehension Verbalized understanding            PT Short Term Goals - 04/01/20 1231      PT SHORT TERM GOAL #1   Title Patient will report at least 25% improvement in overall symptoms and/or function to demonstrate improved functional mobility    Time 4    Period Weeks    Status New    Target Date 04/29/20  PT SHORT TERM GOAL #2   Title Patient will be independent in self management strategies to improve quality of life and functional outcomes.    Time 4    Period Weeks    Status New    Target Date 04/29/20      PT SHORT TERM GOAL #3   Title Patient will be able to transition from sit to stand without use of upper extremities to improve transitional mobility    Time 4    Period Weeks    Status New    Target Date 04/29/20             PT Long Term Goals - 04/01/20 1231      PT LONG TERM GOAL #1   Title Patient will report at least 50% improvement in overall symptoms and/or function to demonstrate improved functional mobility    Time 8    Period Weeks    Status New    Target Date 05/27/20      PT LONG TERM GOAL #2   Title Patient will be able to ascend and descend stairs with use of railing and step through gait pattern to improve ability to go up to bedroom    Time 8    Period Weeks    Status New    Target Date 05/27/20        PT LONG TERM GOAL #3   Title Patient will be able to ambulate at least 226 feet in 2 minutes without use of Assistive device to improve ability to walk in community    Time 8    Period Weeks    Status New    Target Date 05/27/20                 Plan - 04/06/20 1038    Clinical Impression Statement Began session with ambulation X 150 feet.  Noted early fatigue and intermittent shuffling of Lt foot using AWW.  Initiated sit to stands without UE assist.  Difficulty initiating but able to complete 5 reps.  Pt immediately attempted use of UE's to lift Lt LE into bed, however instructed to use muscle and not to assist.  Pt able to complete with increased time.  Pt complained of pain and difficutly bending Lt knee in supine to assume bridge position.  Pt had to stop when approx. 60 degrees flexion due to pain and therapist had to position even with Rt.  Unable to produced a controlled contraction to complete a bridge today.  Instructed pt to do more exercises at home as she admits to laying in the bed mostly.    Personal Factors and Comorbidities Comorbidity 1;Comorbidity 2    Comorbidities migraines, HTN    Examination-Activity Limitations Locomotion Level;Lift;Transfers;Toileting;Stand;Stairs;Squat;Carry;Bathing    Examination-Participation Restrictions Cleaning;Community Activity;Meal Prep;Shop;Occupation    Stability/Clinical Decision Making Stable/Uncomplicated    Rehab Potential Good    PT Frequency --   1-2x/week for total of 12 visits over 8 week certification period   PT Duration 8 weeks    PT Treatment/Interventions ADLs/Self Care Home Management;Aquatic Therapy;Cryotherapy;Electrical Stimulation;Moist Heat;Traction;Balance training;Therapeutic exercise;Therapeutic activities;Functional mobility training;Stair training;Gait training;DME Instruction;Neuromuscular re-education;Patient/family education;Orthotic Fit/Training;Manual techniques;Energy conservation;Dry needling;Passive  range of motion    PT Next Visit Plan LE strengthening.  focus on muscle activation, walking mechanics, transfers without using UE to mobilize Lt LE, standing exercises.    PT Home Exercise Plan LAQs, DF    Consulted and Agree with Plan of Care Patient  Patient will benefit from skilled therapeutic intervention in order to improve the following deficits and impairments:  Decreased coordination, Improper body mechanics, Decreased range of motion, Decreased balance, Decreased mobility, Difficulty walking, Pain, Decreased strength, Decreased activity tolerance, Decreased knowledge of use of DME, Abnormal gait, Decreased endurance  Visit Diagnosis: Difficulty in walking, not elsewhere classified  Muscle weakness (generalized)     Problem List Patient Active Problem List   Diagnosis Date Noted  . Anxiety state   . Dyslipidemia   . Labile blood pressure   . Migraine without status migrainosus, not intractable   . Right thalamic infarction ([place]) 03/10/2020  . Left hemiparesis ([place]) 03/10/2020  . Recurrent falls 03/05/2020  . Leukocytosis 03/05/2020  . Acute left-sided weakness 03/05/2020  . Acute CVA (cerebrovascular accident) ([place]) 03/04/2020  . MDD (major depressive disorder), recurrent episode, moderate ([place]) 05/01/2017  . PTSD (post-traumatic stress disorder) 05/01/2017  . Depression, recurrent ([place]) 12/07/2016  . Essential hypertension 07/20/2016  . Uterine fibroid 01/27/2016  . 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

## 2020-04-07 ENCOUNTER — Encounter (HOSPITAL_COMMUNITY): Payer: Self-pay

## 2020-04-07 ENCOUNTER — Ambulatory Visit (HOSPITAL_COMMUNITY): Payer: Medicaid Other

## 2020-04-07 DIAGNOSIS — R262 Difficulty in walking, not elsewhere classified: Secondary | ICD-10-CM

## 2020-04-07 DIAGNOSIS — M6281 Muscle weakness (generalized): Secondary | ICD-10-CM

## 2020-04-07 DIAGNOSIS — I639 Cerebral infarction, unspecified: Secondary | ICD-10-CM | POA: Diagnosis not present

## 2020-04-07 NOTE — Therapy (Addendum)
**Note Joyce-Identified via Obfuscation** Joyce [name], [place], [address] Phone: [phone]   Fax:  [phone]  Physical Therapy Treatment  Patient Details  Name: Joyce [name] MRN: [id number] Date of Birth: [birth year] Referring Provider (PT): Lauraine Rinne PA   Encounter Date: 04/07/2020   PT End of Session - 04/07/20 0907    Visit Number 3    Number of Visits 12    Date for PT Re-Evaluation 05/27/20    Authorization Type self -pay, [employer]-awaiting approval    Progress Note Due on Visit 10    PT Start Time [phone]    PT Stop Time 0920    PT Time Calculation (min) 42 min    Equipment Utilized During Treatment Gait belt    Activity Tolerance Patient limited by fatigue    Behavior During Therapy [facility] for tasks assessed/performed           Past Medical History:  Diagnosis Date   Acute CVA (cerebrovascular accident) ([place]) 03/04/2020   AMA (advanced maternal age) multigravida 35+ 11/12/2014   Anemia    Depression    Hypertension    Migraine    Pregnant 11/12/2014   PTSD (post-traumatic stress disorder) 05/01/2017   Round ligament pain 11/12/2014   Short of breath on exertion 11/12/2014   Trichomonas infection     Past Surgical History:  Procedure Laterality Date   CHOLECYSTECTOMY      There were no vitals filed for this visit.   Subjective Assessment - 04/07/20 0848    Subjective Pt reports she has been walking a lot, goes up and down stairs at least 1 time a day.  Lt side is still numb.  Reports occasional pain in lower left abdominal region.    Pertinent History HTN, Migraine, PTSD, Acute CVA 03/04/20    Currently in Pain? Yes    Pain Score 5     Pain Location Leg    Pain Orientation Left    Pain Descriptors / Indicators Aching;Tingling    Pain Type Chronic pain    Pain Frequency Intermittent    Aggravating Factors  weight bearing                             [facility] Adult PT Treatment/Exercise -  04/07/20 0001      Ambulation/Gait   Ambulation/Gait Yes    Ambulation/Gait Assistance 5: Supervision    Ambulation/Gait Assistance Details intermittent Lt LE    Ambulation Distance (Feet) 226 Feet   180 then 46   Assistive device Rolling walker   rollator   Gait Pattern Decreased dorsiflexion - left;Decreased hip/knee flexion - left;Decreased stance time - left;Decreased stride length;Left foot flat    Ambulation Surface Level;Indoor    Gait velocity decreased      Exercises   Exercises Knee/Hip      Knee/Hip Exercises: Standing   Hip Flexion --   3 reps   Hip Flexion Limitations marching alternating      Knee/Hip Exercises: Seated   Other Seated Knee/Hip Exercises DF Left x15 5" hold     Sit to Sand 5 reps;without UE support      Knee/Hip Exercises: Supine   Bridges 10 reps    Bridges Limitations glut sets, minimal raise                    PT Short Term Goals - 04/01/20 1231      PT  SHORT TERM GOAL #1   Title Patient will report at least 25% improvement in overall symptoms and/or function to demonstrate improved functional mobility    Time 4    Period Weeks    Status New    Target Date 04/29/20      PT SHORT TERM GOAL #2   Title Patient will be independent in self management strategies to improve quality of life and functional outcomes.    Time 4    Period Weeks    Status New    Target Date 04/29/20      PT SHORT TERM GOAL #3   Title Patient will be able to transition from sit to stand without use of upper extremities to improve transitional mobility    Time 4    Period Weeks    Status New    Target Date 04/29/20             PT Long Term Goals - 04/01/20 1231      PT LONG TERM GOAL #1   Title Patient will report at least 50% improvement in overall symptoms and/or function to demonstrate improved functional mobility    Time 8    Period Weeks    Status New    Target Date 05/27/20      PT LONG TERM GOAL #2   Title Patient will be able to  ascend and descend stairs with use of railing and step through gait pattern to improve ability to go up to bedroom    Time 8    Period Weeks    Status New    Target Date 05/27/20      PT LONG TERM GOAL #3   Title Patient will be able to ambulate at least 226 feet in 2 minutes without use of Assistive device to improve ability to walk in community    Time 8    Period Weeks    Status New    Target Date 05/27/20                 Plan - 04/07/20 0939    Clinical Impression Statement Gait training complete with rollator with cueing for Lt foot clearance and to increase stride length to normalize gait mechanincs.  Added standing marching for SLS to improve balance with gait.  Pt easily fatigued following 3-5 reps with exercises.  Good control with STS and no HHA this session.  Reviewed current HEP and encouraged pt not to complete exercises on floor as reports she required family members assistance to get up from floor.    Personal Factors and Comorbidities Comorbidity 1;Comorbidity 2    Comorbidities migraines, HTN    Examination-Activity Limitations Locomotion Level;Lift;Transfers;Toileting;Stand;Stairs;Squat;Carry;Bathing    Examination-Participation Restrictions Cleaning;Community Activity;Meal Prep;Shop;Occupation    Stability/Clinical Decision Making Stable/Uncomplicated    Clinical Decision Making Low    Rehab Potential Good    PT Frequency --   1-2x/week for total of 12 visits over 8 week certification period   PT Duration 8 weeks    PT Treatment/Interventions ADLs/Self Care Home Management;Aquatic Therapy;Cryotherapy;Electrical Stimulation;Moist Heat;Traction;Balance training;Therapeutic exercise;Therapeutic activities;Functional mobility training;Stair training;Gait training;DME Instruction;Neuromuscular re-education;Patient/family education;Orthotic Fit/Training;Manual techniques;Energy conservation;Dry needling;Passive range of motion    PT Next Visit Plan LE strengthening.   focus on muscle activation, walking mechanics, transfers without using UE to mobilize Lt LE, standing exercises.  Added squats with HHA and hip abd next session.    PT Home Exercise Plan LAQs, DF  Patient will benefit from skilled therapeutic intervention in order to improve the following deficits and impairments:  Decreased coordination, Improper body mechanics, Decreased range of motion, Decreased balance, Decreased mobility, Difficulty walking, Pain, Decreased strength, Decreased activity tolerance, Decreased knowledge of use of DME, Abnormal gait, Decreased endurance  Visit Diagnosis: Difficulty in walking, not elsewhere classified  Muscle weakness (generalized)     Problem List Patient Active Problem List   Diagnosis Date Noted   Anxiety state    Dyslipidemia    Labile blood pressure    Migraine without status migrainosus, not intractable    Right thalamic infarction ([place]) 03/10/2020   Left hemiparesis ([place]) 03/10/2020   Recurrent falls 03/05/2020   Leukocytosis 03/05/2020   Acute left-sided weakness 03/05/2020   Acute CVA (cerebrovascular accident) ([place]) 03/04/2020   MDD (major depressive disorder), recurrent episode, moderate ([place]) 05/01/2017   PTSD (post-traumatic stress disorder) 05/01/2017   Depression, recurrent ([place]) 12/07/2016   Essential hypertension 07/20/2016   Uterine fibroid 01/27/2016   Cervical high risk HPV (human papillomavirus) test positive 12/08/2014   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

## 2020-04-14 ENCOUNTER — Ambulatory Visit (HOSPITAL_COMMUNITY): Payer: Medicaid Other | Admitting: Physical Therapy

## 2020-04-14 ENCOUNTER — Telehealth (HOSPITAL_COMMUNITY): Payer: Self-pay | Admitting: Physical Therapy

## 2020-04-14 NOTE — Telephone Encounter (Signed)
pt called to cx this appt due to she had an emergency come up

## 2020-04-15 ENCOUNTER — Ambulatory Visit (HOSPITAL_COMMUNITY): Payer: Medicaid Other | Admitting: Physical Therapy

## 2020-04-15 ENCOUNTER — Other Ambulatory Visit: Payer: Self-pay

## 2020-04-15 DIAGNOSIS — R262 Difficulty in walking, not elsewhere classified: Secondary | ICD-10-CM | POA: Diagnosis not present

## 2020-04-15 DIAGNOSIS — I639 Cerebral infarction, unspecified: Secondary | ICD-10-CM

## 2020-04-15 DIAGNOSIS — M6281 Muscle weakness (generalized): Secondary | ICD-10-CM | POA: Diagnosis not present

## 2020-04-15 DIAGNOSIS — I6381 Other cerebral infarction due to occlusion or stenosis of small artery: Secondary | ICD-10-CM

## 2020-04-15 NOTE — Therapy (Signed)
[place], [place], [address] Phone: [phone]   Fax:  [phone]  Physical Therapy Treatment  Patient Details  Name: Joyce [name] MRN: [id number] Date of Birth: 1975/10/30 Referring Provider (PT): Lauraine Rinne PA   Encounter Date: 04/15/2020   PT End of Session - 04/15/20 1537    Visit Number 4    Number of Visits 12    Date for PT Re-Evaluation 05/27/20    Authorization Type self -pay, [employer]-awaiting approval    Progress Note Due on Visit 10    PT Start Time 1448    PT Stop Time 1533    PT Time Calculation (min) 45 min    Equipment Utilized During Treatment Gait belt    Activity Tolerance Patient limited by fatigue    Behavior During Therapy [facility] for tasks assessed/performed           Past Medical History:  Diagnosis Date  . Acute CVA (cerebrovascular accident) ([place]) 03/04/2020  . AMA (advanced maternal age) multigravida 35+ 11/12/2014  . Anemia   . Depression   . Hypertension   . Migraine   . Pregnant 11/12/2014  . PTSD (post-traumatic stress disorder) 05/01/2017  . Round ligament pain 11/12/2014  . Short of breath on exertion 11/12/2014  . Trichomonas infection     Past Surgical History:  Procedure Laterality Date  . CHOLECYSTECTOMY      There were no vitals filed for this visit.   Subjective Assessment - 04/15/20 1539    Subjective pt states her Lt LE is sore and shaky today.  STates she does some activity at home.    Currently in Pain? Yes    Pain Score 6     Pain Location Leg    Pain Orientation Left                             [facility] Adult PT Treatment/Exercise - 04/15/20 0001      Knee/Hip Exercises: Standing   Heel Raises 20 reps    Hip Flexion 5 reps;2 sets    Hip Flexion Limitations marching alternating    Hip ADduction Both;10 reps    Hip Abduction Both;10 reps      Knee/Hip Exercises: Seated   Long Arc Quad AROM;Left;2 sets;10 reps     Other Seated Knee/Hip Exercises DF Left x15 5" hold     Sit to Sand 5 reps;without UE support;2 sets                    PT Short Term Goals - 04/01/20 1231      PT SHORT TERM GOAL #1   Title Patient will report at least 25% improvement in overall symptoms and/or function to demonstrate improved functional mobility    Time 4    Period Weeks    Status New    Target Date 04/29/20      PT SHORT TERM GOAL #2   Title Patient will be independent in self management strategies to improve quality of life and functional outcomes.    Time 4    Period Weeks    Status New    Target Date 04/29/20      PT SHORT TERM GOAL #3   Title Patient will be able to transition from sit to stand without use of upper extremities to improve transitional mobility    Time 4    Period Weeks  Status New    Target Date 04/29/20             PT Long Term Goals - 04/01/20 1231      PT LONG TERM GOAL #1   Title Patient will report at least 50% improvement in overall symptoms and/or function to demonstrate improved functional mobility    Time 8    Period Weeks    Status New    Target Date 05/27/20      PT LONG TERM GOAL #2   Title Patient will be able to ascend and descend stairs with use of railing and step through gait pattern to improve ability to go up to bedroom    Time 8    Period Weeks    Status New    Target Date 05/27/20      PT LONG TERM GOAL #3   Title Patient will be able to ambulate at least 226 feet in 2 minutes without use of Assistive device to improve ability to walk in community    Time 8    Period Weeks    Status New    Target Date 05/27/20                 Plan - 04/15/20 1537    Clinical Impression Statement Pt with noted antalgia and difficulty rising from chair to come back into clinic.  Tendency to drag Lt foot with ambulation and mm tremors bilaterally.  Focused most exercises in standing today with ability to complete all instructed therex with noted  pain behaviors.  Able to complete 2 sets of 5 alternating marching in standing (was unable to do this) and worked on sit to stands without UE assist (2 sets of 5 reps).   Pt able to do these though displayed distressed breathing and tremors upon standing (quickly went away with sitting).    Pt attempted to reach and grab bars to pull self up at end of session.  Cued not to do this and use her legs more as she just demonstrated ability and strength in LE's to rise without UE's.  Pt displayed adequate heel to toe gait when exiting clinic at end of session.    Personal Factors and Comorbidities Comorbidity 1;Comorbidity 2    Comorbidities migraines, HTN    Examination-Activity Limitations Locomotion Level;Lift;Transfers;Toileting;Stand;Stairs;Squat;Carry;Bathing    Examination-Participation Restrictions Cleaning;Community Activity;Meal Prep;Shop;Occupation    Stability/Clinical Decision Making Stable/Uncomplicated    Rehab Potential Good    PT Frequency --   1-2x/week for total of 12 visits over 8 week certification period   PT Duration 8 weeks    PT Treatment/Interventions ADLs/Self Care Home Management;Aquatic Therapy;Cryotherapy;Electrical Stimulation;Moist Heat;Traction;Balance training;Therapeutic exercise;Therapeutic activities;Functional mobility training;Stair training;Gait training;DME Instruction;Neuromuscular re-education;Patient/family education;Orthotic Fit/Training;Manual techniques;Energy conservation;Dry needling;Passive range of motion    PT Next Visit Plan LE strengthening.  focus on muscle activation, walking mechanics, transfers without using UE to mobilize Lt LE, standing exercises.  Progress standing and activie exericse.    PT Home Exercise Plan LAQs, DF           Patient will benefit from skilled therapeutic intervention in order to improve the following deficits and impairments:  Decreased coordination, Improper body mechanics, Decreased range of motion, Decreased balance,  Decreased mobility, Difficulty walking, Pain, Decreased strength, Decreased activity tolerance, Decreased knowledge of use of DME, Abnormal gait, Decreased endurance  Visit Diagnosis: Difficulty in walking, not elsewhere classified  Muscle weakness (generalized)  Right thalamic infarction [facility])     Problem List Patient Active  Problem List   Diagnosis Date Noted  . Anxiety state   . Dyslipidemia   . Labile blood pressure   . Migraine without status migrainosus, not intractable   . Right thalamic infarction ([place]) 03/10/2020  . Left hemiparesis ([place]) 03/10/2020  . Recurrent falls 03/05/2020  . Leukocytosis 03/05/2020  . Acute left-sided weakness 03/05/2020  . Acute CVA (cerebrovascular accident) ([place]) 03/04/2020  . MDD (major depressive disorder), recurrent episode, moderate ([place]) 05/01/2017  . PTSD (post-traumatic stress disorder) 05/01/2017  . Depression, recurrent ([place]) 12/07/2016  . Essential hypertension 07/20/2016  . Uterine fibroid 01/27/2016  . 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

## 2020-04-16 ENCOUNTER — Telehealth (HOSPITAL_COMMUNITY): Payer: Self-pay | Admitting: Physical Therapy

## 2020-04-16 ENCOUNTER — Ambulatory Visit (HOSPITAL_COMMUNITY): Payer: Medicaid Other | Admitting: Physical Therapy

## 2020-04-16 NOTE — Telephone Encounter (Signed)
pt called to cx this appt and rescheduled to tomorrow.

## 2020-04-17 ENCOUNTER — Ambulatory Visit (HOSPITAL_COMMUNITY): Payer: Medicaid Other | Admitting: Physical Therapy

## 2020-04-17 ENCOUNTER — Other Ambulatory Visit: Payer: Self-pay

## 2020-04-17 DIAGNOSIS — I639 Cerebral infarction, unspecified: Secondary | ICD-10-CM | POA: Diagnosis not present

## 2020-04-17 DIAGNOSIS — I6381 Other cerebral infarction due to occlusion or stenosis of small artery: Secondary | ICD-10-CM

## 2020-04-17 DIAGNOSIS — M6281 Muscle weakness (generalized): Secondary | ICD-10-CM | POA: Diagnosis not present

## 2020-04-17 DIAGNOSIS — R262 Difficulty in walking, not elsewhere classified: Secondary | ICD-10-CM

## 2020-04-17 NOTE — Therapy (Signed)
Joyce [name], [place], [address] Phone: [phone]   Fax:  [phone]  Physical Therapy Treatment  Patient Details  Name: Joyce [name] MRN: [id number] Date of Birth: 1976-03-28 Referring Provider (PT): Lauraine Rinne PA   Encounter Date: 04/17/2020   PT End of Session - 04/17/20 1400    Visit Number 5    Number of Visits 12    Date for PT Re-Evaluation 05/27/20    Authorization Type self -pay, [employer]-awaiting approval    Progress Note Due on Visit 10    PT Start Time 1320    PT Stop Time 1400    PT Time Calculation (min) 40 min    Equipment Utilized During Treatment Gait belt    Activity Tolerance Patient limited by fatigue    Behavior During Therapy Joyce [name] for tasks assessed/performed           Past Medical History:  Diagnosis Date  . Acute CVA (cerebrovascular accident) ([place]) 03/04/2020  . AMA (advanced maternal age) multigravida 35+ 11/12/2014  . Anemia   . Depression   . Hypertension   . Migraine   . Pregnant 11/12/2014  . PTSD (post-traumatic stress disorder) 05/01/2017  . Round ligament pain 11/12/2014  . Short of breath on exertion 11/12/2014  . Trichomonas infection     Past Surgical History:  Procedure Laterality Date  . CHOLECYSTECTOMY      There were no vitals filed for this visit.   Subjective Assessment - 04/17/20 1323    Subjective PT states that she is trying to get her exercises done everyday.    Pertinent History HTN, Migraine, PTSD, Acute CVA 03/04/20    Currently in Pain? Yes   her arm we will not be addressing this                            [name] Treatment/Exercise - 04/17/20 0001      Exercises   Exercises Knee/Hip      Knee/Hip Exercises: Standing   Heel Raises Both;20 reps    Forward Lunges Both;10 reps;Limitations    Forward Lunges Limitations 4" step     Forward Step Up Both;10 reps;Step Height: 4"    Functional Squat 10 reps      SLS x 5 each    Other Standing Knee Exercises side step x 2 RT       Knee/Hip Exercises: Seated   Sit to Sand 10 reps                    PT Short Term Goals - 04/01/20 1231      PT SHORT TERM GOAL #1   Title Patient will report at least 25% improvement in overall symptoms and/or function to demonstrate improved functional mobility    Time 4    Period Weeks    Status New    Target Date 04/29/20      PT SHORT TERM GOAL #2   Title Patient will be independent in self management strategies to improve quality of life and functional outcomes.    Time 4    Period Weeks    Status New    Target Date 04/29/20      PT SHORT TERM GOAL #3   Title Patient will be able to transition from sit to stand without use of upper extremities to improve transitional mobility    Time 4  Period Weeks    Status New    Target Date 04/29/20             PT Long Term Goals - 04/01/20 1231      PT LONG TERM GOAL #1   Title Patient will report at least 50% improvement in overall symptoms and/or function to demonstrate improved functional mobility    Time 8    Period Weeks    Status New    Target Date 05/27/20      PT LONG TERM GOAL #2   Title Patient will be able to ascend and descend stairs with use of railing and step through gait pattern to improve ability to go up to bedroom    Time 8    Period Weeks    Status New    Target Date 05/27/20      PT LONG TERM GOAL #3   Title Patient will be able to ambulate at least 226 feet in 2 minutes without use of Assistive device to improve ability to walk in community    Time 8    Period Weeks    Status New    Target Date 05/27/20                 Plan - 04/17/20 1400    Clinical Impression Statement Treatment continues with POC adding lunging, SLS and side stepping to program.  Pt needs SBA for all exercise as she has decreased strength and coordination.  Pt able to complete all exercises with minimal rest breaks.    Personal  Factors and Comorbidities Comorbidity 1;Comorbidity 2    Comorbidities migraines, HTN    Examination-Activity Limitations Locomotion Level;Lift;Transfers;Toileting;Stand;Stairs;Squat;Carry;Bathing    Examination-Participation Restrictions Cleaning;Community Activity;Meal Prep;Shop;Occupation    Stability/Clinical Decision Making Stable/Uncomplicated    Rehab Potential Good    PT Frequency --   1-2x/week for total of 12 visits over 8 week certification period   PT Duration 8 weeks    PT Treatment/Interventions ADLs/Self Care Home Management;Aquatic Therapy;Cryotherapy;Electrical Stimulation;Moist Heat;Traction;Balance training;Therapeutic exercise;Therapeutic activities;Functional mobility training;Stair training;Gait training;DME Instruction;Neuromuscular re-education;Patient/family education;Orthotic Fit/Training;Manual techniques;Energy conservation;Dry needling;Passive range of motion    PT Next Visit Plan LE strengthening.  focus on muscle activation, walking mechanics, transfers without using UE to mobilize Lt LE, standing exercises.  Progress standing and activie exericse.    PT Home Exercise Plan LAQs, DF           Patient will benefit from skilled therapeutic intervention in order to improve the following deficits and impairments:  Decreased coordination, Improper body mechanics, Decreased range of motion, Decreased balance, Decreased mobility, Difficulty walking, Pain, Decreased strength, Decreased activity tolerance, Decreased knowledge of use of DME, Abnormal gait, Decreased endurance  Visit Diagnosis: Difficulty in walking, not elsewhere classified  Muscle weakness (generalized)  Right thalamic infarction [facility])     Problem List Patient Active Problem List   Diagnosis Date Noted  . Anxiety state   . Dyslipidemia   . Labile blood pressure   . Migraine without status migrainosus, not intractable   . Right thalamic infarction ([place]) 03/10/2020  . Left hemiparesis ([place])  03/10/2020  . Recurrent falls 03/05/2020  . Leukocytosis 03/05/2020  . Acute left-sided weakness 03/05/2020  . Acute CVA (cerebrovascular accident) ([place]) 03/04/2020  . MDD (major depressive disorder), recurrent episode, moderate ([place]) 05/01/2017  . PTSD (post-traumatic stress disorder) 05/01/2017  . Depression, recurrent ([place]) 12/07/2016  . Essential hypertension 07/20/2016  . Uterine fibroid 01/27/2016  . Cervical high risk HPV (human papillomavirus) test  positive 12/08/2014  . 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 [place], [place], [address] Phone: [phone]   Fax:  [phone]  Name: HAROLD MATTES MRN: [id number] Date of Birth: Jun 11, 1975

## 2020-04-20 ENCOUNTER — Other Ambulatory Visit: Payer: Self-pay

## 2020-04-20 ENCOUNTER — Ambulatory Visit (INDEPENDENT_AMBULATORY_CARE_PROVIDER_SITE_OTHER): Payer: Medicaid Other | Admitting: Women's Health

## 2020-04-20 ENCOUNTER — Ambulatory Visit (HOSPITAL_COMMUNITY)
Admission: RE | Admit: 2020-04-20 | Discharge: 2020-04-20 | Disposition: A | Discharge status: Home / Self Care | Payer: Medicaid Other | Source: Ambulatory Visit | Attending: Family Medicine | Admitting: Family Medicine

## 2020-04-20 ENCOUNTER — Encounter: Payer: Self-pay | Admitting: Women's Health

## 2020-04-20 VITALS — BP 133/77 | HR 78 | Ht 65.0 in | Wt 226.0 lb

## 2020-04-20 DIAGNOSIS — Z3202 Encounter for pregnancy test, result negative: Secondary | ICD-10-CM

## 2020-04-20 DIAGNOSIS — Z1231 Encounter for screening mammogram for malignant neoplasm of breast: Secondary | ICD-10-CM | POA: Insufficient documentation

## 2020-04-20 DIAGNOSIS — Z30430 Encounter for insertion of intrauterine contraceptive device: Secondary | ICD-10-CM | POA: Diagnosis not present

## 2020-04-20 LAB — POCT URINE PREGNANCY: Preg Test, Ur: NEGATIVE

## 2020-04-20 IMAGING — MG DIGITAL SCREENING BILAT W/ TOMO W/ CAD
8 series · 8 of 24 positions shown · non-contrast
Comparison: None.

CLINICAL DATA: Screening.

EXAM:
DIGITAL SCREENING BILATERAL MAMMOGRAM WITH TOMO AND CAD

[R CC synth-2D]
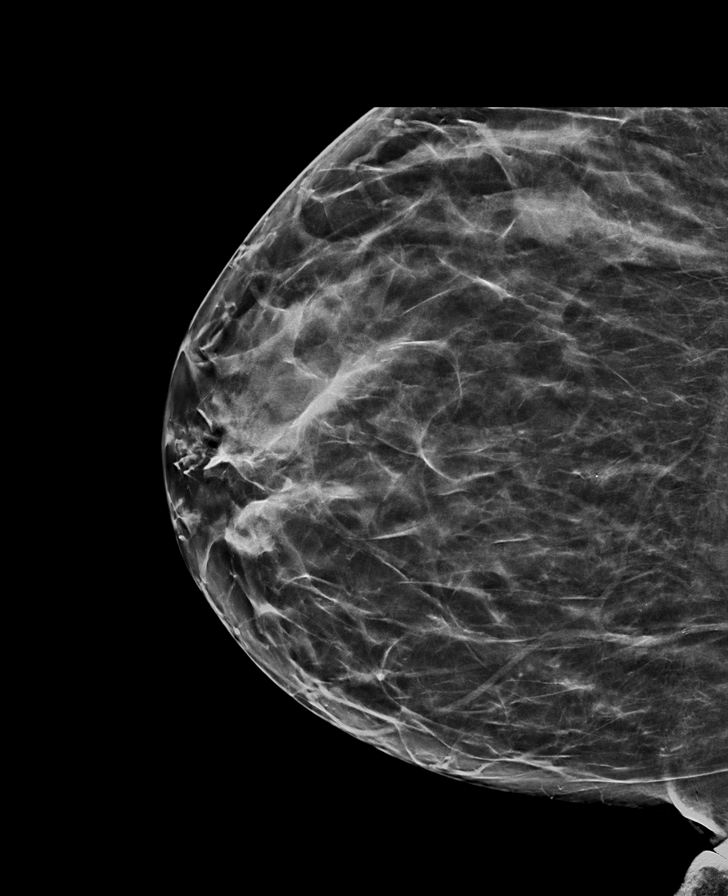

[L CC synth-2D]
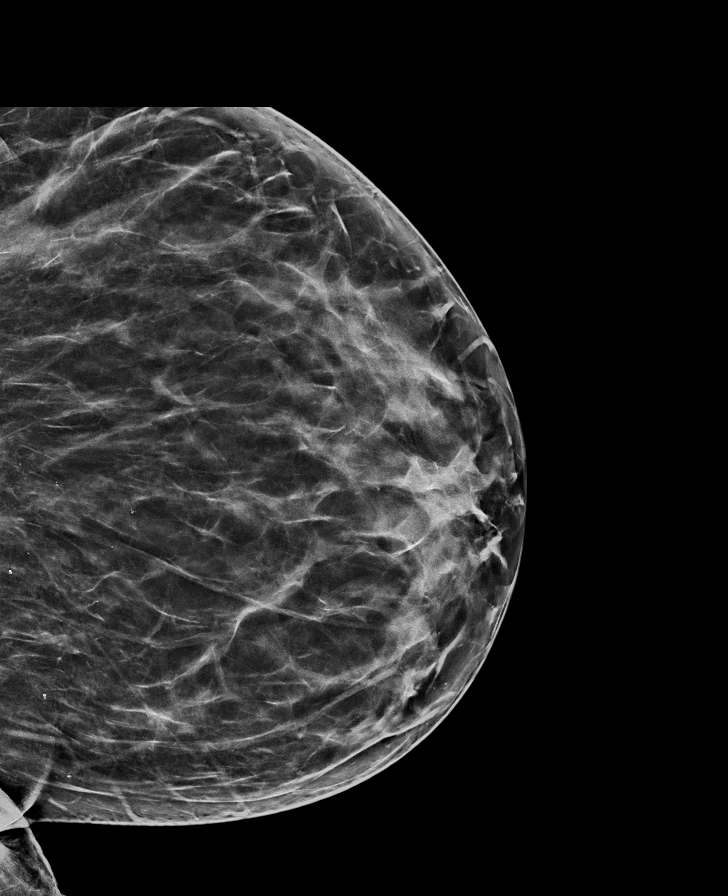

[L MLO synth-2D]
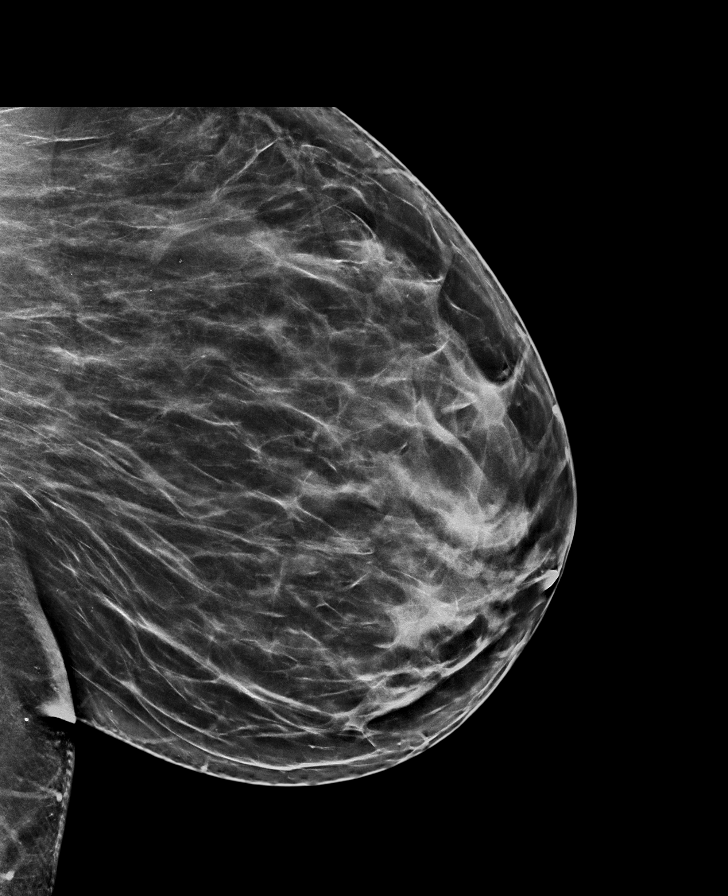

[R MLO synth-2D]
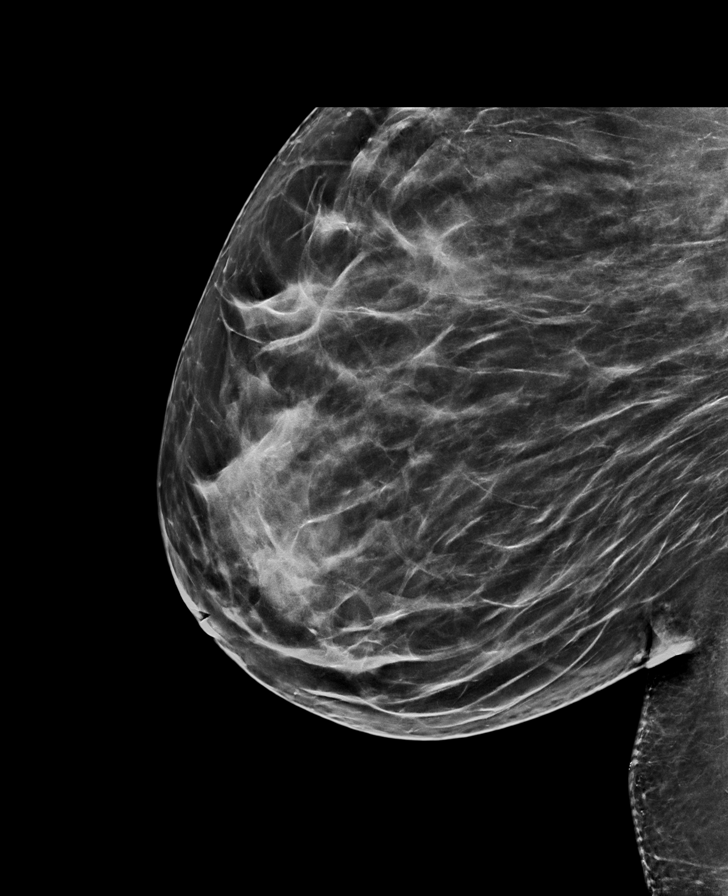

[L CC tomo · tomo slice 38/75.0]
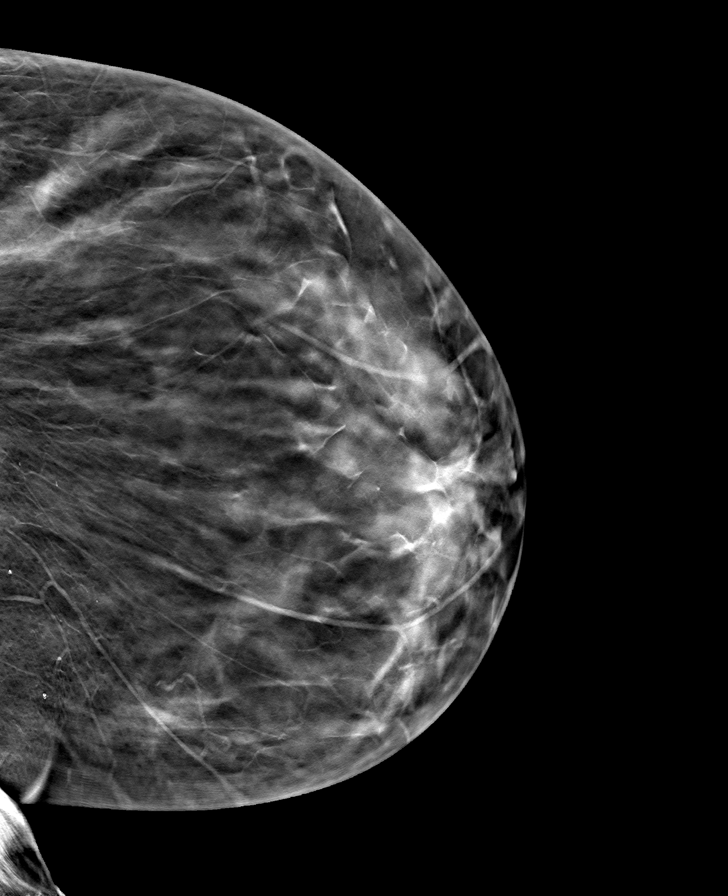

[R MLO tomo · tomo slice 41/82.0]
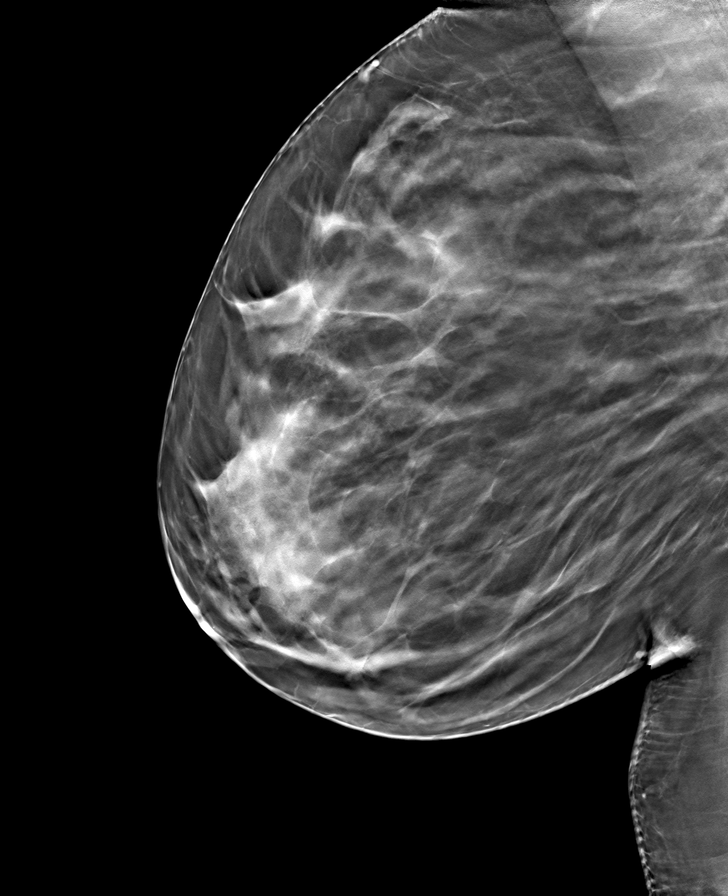

[L MLO tomo · tomo slice 43/84.0]
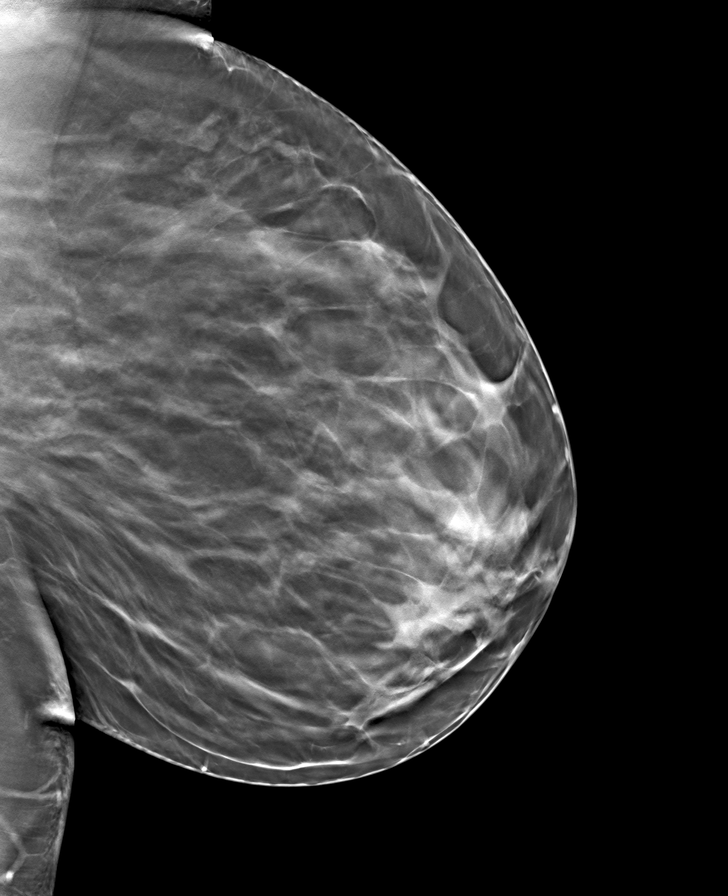

[R CC tomo · tomo slice 35/70.0]
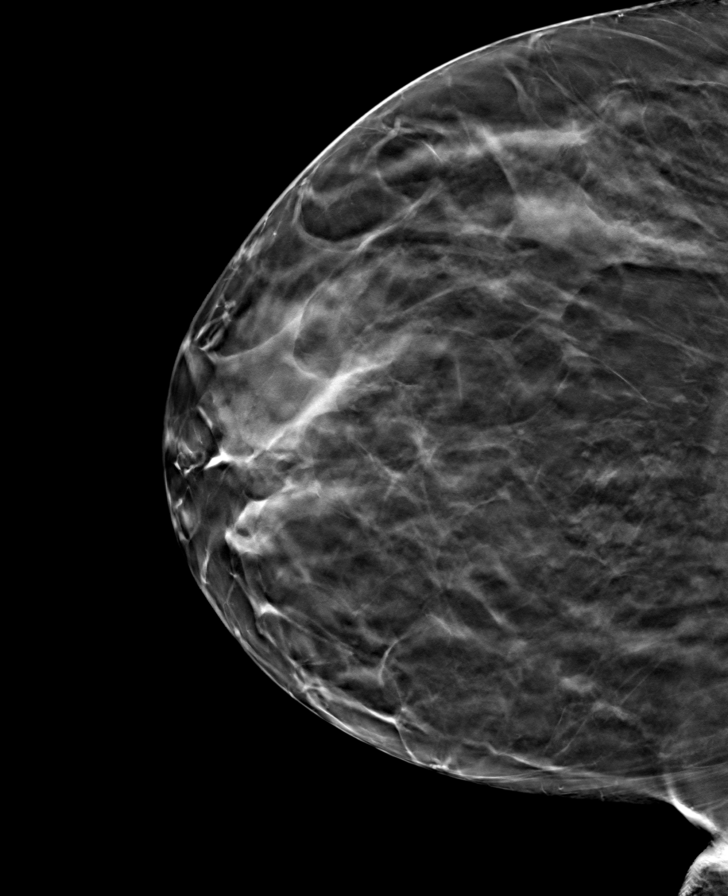

[8 of 24 positions shown; findings below may reference images not displayed]

ACR Breast Density Category c: The breast tissue is heterogeneously
dense, which may obscure small masses
FINDINGS: There are no findings suspicious for malignancy. Images were
processed with CAD.
IMPRESSION: No mammographic evidence of malignancy. A result letter of this
screening mammogram will be mailed directly to the patient.

RECOMMENDATION:
Screening mammogram in one year. (Code:[4W])

BI-RADS CATEGORY  1: Negative.

## 2020-04-20 MED ORDER — PARAGARD INTRAUTERINE COPPER IU IUD
INTRAUTERINE_SYSTEM | Freq: Once | INTRAUTERINE | Status: AC
Start: 2020-04-20 — End: 2020-04-20
  Administered 2020-04-20: 1 via INTRAUTERINE

## 2020-04-20 NOTE — Patient Instructions (Signed)
Nothing in vagina for 3 days (no sex, douching, tampons, etc...)  Check your strings once a month to make sure you can feel them, if you are not able to please let us know  If you develop a fever of 100.4 or more in the next few weeks, or if you develop severe abdominal pain, please let [place] know  Use a backup method of birth control, such as condoms, for 2 weeks     Intrauterine Device Insertion, Care After  This sheet gives you information about how to care for yourself after your procedure. Your health care provider may also give you more specific instructions. If you have problems or questions, contact your health care provider. What can I expect after the procedure? After the procedure, it is common to have:  Cramps and pain in the abdomen.  Light bleeding (spotting) or heavier bleeding that is like your menstrual period. This may last for up to a few days.  Lower back pain.  Dizziness.  Headaches.  Nausea. Follow these instructions at home:  Before resuming sexual activity, check to make sure that you can feel the IUD string(s). You should be able to feel the end of the string(s) below the opening of your cervix. If your IUD string is in place, you may resume sexual activity. ? If you had a hormonal IUD inserted more than 7 days after your most recent period started, you will need to use a backup method of birth control for 7 days after IUD insertion. Ask your health care provider whether this applies to you.  Continue to check that the IUD is still in place by feeling for the string(s) after every menstrual period, or once a month.  Take over-the-counter and prescription medicines only as told by your health care provider.  Do not drive or use heavy machinery while taking prescription pain medicine.  Keep all follow-up visits as told by your health care provider. This is important. Contact a health care provider if:  You have bleeding that is heavier or lasts longer  than a normal menstrual cycle.  You have a fever.  You have cramps or abdominal pain that get worse or do not get better with medicine.  You develop abdominal pain that is new or is not in the same area of earlier cramping and pain.  You feel lightheaded or weak.  You have abnormal or bad-smelling discharge from your vagina.  You have pain during sexual activity.  You have any of the following problems with your IUD string(s): ? The string bothers or hurts you or your sexual partner. ? You cannot feel the string. ? The string has gotten longer.  You can feel the IUD in your vagina.  You think you may be pregnant, or you miss your menstrual period.  You think you may have an STI (sexually transmitted infection). Get help right away if:  You have flu-like symptoms.  You have a fever and chills.  You can feel that your IUD has slipped out of place. Summary  After the procedure, it is common to have cramps and pain in the abdomen. It is also common to have light bleeding (spotting) or heavier bleeding that is like your menstrual period.  Continue to check that the IUD is still in place by feeling for the string(s) after every menstrual period, or once a month.  Keep all follow-up visits as told by your health care provider. This is important.  Contact your health care provider  if you have problems with your IUD string(s), such as the string getting longer or bothering you or your sexual partner. This information is not intended to replace advice given to you by your health care provider. Make sure you discuss any questions you have with your health care provider. Document Revised: 04/28/2017 Document Reviewed: 04/06/2016 Elsevier Patient Education  2020 [employer].

## 2020-04-20 NOTE — Progress Notes (Signed)
   IUD INSERTION Patient name: Joyce [name] MRN [id number]  Date of birth: Nov 14, 1975 Subjective Findings:   Joyce [name] is a 44 y.o. [id number] African American female being seen today for insertion of a Paragard IUD.  Depression screen [facility] 2/9 04/03/2020 12/01/2017 02/15/2017 12/07/2016 07/20/2016  Decreased Interest 2 0 0 0 1  Down, Depressed, Hopeless 3 2 0 0 3  PHQ - 2 Score 5 2 0 0 4  Altered sleeping 3 2 - - 0  Tired, decreased energy 2 3 - - 0  Change in appetite 2 0 - - 3  Feeling bad or failure about yourself  3 0 - - 3  Trouble concentrating 3 2 - - 3  Moving slowly or fidgety/restless 1 0 - - 0  Suicidal thoughts 0 0 - - 0  PHQ-9 Score 19 9 - - 13  Difficult doing work/chores - Somewhat difficult - - -    No LMP recorded. Patient has had an injection. Last sexual intercourse was >2wks ago Last pap7/5/19. Results were:  normal  The risks and benefits of the method and placement have been thouroughly reviewed with the patient and all questions were answered.  Specifically the patient is aware of failure rate of 05/998, expulsion of the IUD and of possible perforation.  The patient is aware of irregular bleeding due to the method and understands the incidence of irregular bleeding diminishes with time.  Signed copy of informed consent in chart.  Pertinent History Reviewed:   Reviewed past medical,surgical, social, obstetrical and family history.  Reviewed problem list, medications and allergies. Objective Findings & Procedure:   Vitals:   04/20/20 1124  BP: 133/77  Pulse: 78  Weight: 226 lb (102.5 kg)  Height: 5\' 5"  (1.651 m)  Body mass index is 37.61 kg/m.  Results for orders placed or performed in visit on 04/20/20 (from the past 24 hour(s))  POCT urine pregnancy   Collection Time: 04/20/20 11:21 AM  Result Value Ref Range   Preg Test, Ur Negative Negative     Time out was performed.  A graves speculum was placed in the vagina.  The cervix was visualized,  prepped using Betadine, and grasped with a single tooth tenaculum. The uterus was found to be neutral and it sounded to 8 cm.  Paragard  IUD placed per manufacturer's recommendations. The strings were trimmed to approximately 3 cm. The patient tolerated the procedure well.   Informal transvaginal sonogram was performed and the proper placement of the IUD was verified.  Chaperone: Celene Squibb   Assessment & Plan:   1) Paragard IUD insertion The patient was given post procedure instructions, including signs and symptoms of infection and to check for the strings after each menses or each month, and refraining from intercourse or anything in the vagina for 3 days. She was given a care card with date IUD placed, and date IUD to be removed. She is scheduled for a f/u appointment in 4 weeks.  Orders Placed This Encounter  Procedures  . POCT urine pregnancy    Return in about 4 weeks (around 05/18/2020) for F/U IUD , in person, CNM.  [employer], [facility] 04/20/2020 11:54 AM

## 2020-04-21 ENCOUNTER — Ambulatory Visit (HOSPITAL_COMMUNITY): Payer: Medicaid Other | Admitting: Physical Therapy

## 2020-04-22 ENCOUNTER — Ambulatory Visit (HOSPITAL_COMMUNITY): Payer: Medicaid Other

## 2020-04-28 ENCOUNTER — Ambulatory Visit (HOSPITAL_COMMUNITY): Payer: Medicaid Other

## 2020-04-29 ENCOUNTER — Ambulatory Visit (HOSPITAL_COMMUNITY): Payer: Medicaid Other | Attending: Physician Assistant

## 2020-04-29 ENCOUNTER — Telehealth (HOSPITAL_COMMUNITY): Payer: Self-pay

## 2020-04-29 DIAGNOSIS — R262 Difficulty in walking, not elsewhere classified: Secondary | ICD-10-CM | POA: Insufficient documentation

## 2020-04-29 DIAGNOSIS — F331 Major depressive disorder, recurrent, moderate: Secondary | ICD-10-CM | POA: Insufficient documentation

## 2020-04-29 DIAGNOSIS — I639 Cerebral infarction, unspecified: Secondary | ICD-10-CM | POA: Insufficient documentation

## 2020-04-29 DIAGNOSIS — M6281 Muscle weakness (generalized): Secondary | ICD-10-CM | POA: Insufficient documentation

## 2020-04-29 NOTE — Telephone Encounter (Signed)
No show, called and spoke with pt.  Pt did not attend because she thought insurance was not approved.  Apologized for mis-understanding, reminded next apt date and time and included contact information if needs to reschedule or cancel apt in the future.   Ihor Austin, LPTA/CLT; Delana Meyer [phone]

## 2020-04-30 ENCOUNTER — Ambulatory Visit (HOSPITAL_COMMUNITY): Payer: Medicaid Other | Admitting: Physical Therapy

## 2020-05-05 ENCOUNTER — Ambulatory Visit (HOSPITAL_COMMUNITY): Payer: Medicaid Other | Admitting: Physical Therapy

## 2020-05-06 ENCOUNTER — Encounter: Payer: Self-pay | Admitting: Physical Medicine & Rehabilitation

## 2020-05-06 ENCOUNTER — Other Ambulatory Visit: Payer: Self-pay

## 2020-05-06 ENCOUNTER — Encounter: Payer: Medicaid Other | Attending: Physical Medicine & Rehabilitation | Admitting: Physical Medicine & Rehabilitation

## 2020-05-06 VITALS — BP 127/77 | HR 95 | Temp 98.3°F | Ht 65.0 in | Wt 234.2 lb

## 2020-05-06 DIAGNOSIS — G8194 Hemiplegia, unspecified affecting left nondominant side: Secondary | ICD-10-CM | POA: Insufficient documentation

## 2020-05-06 DIAGNOSIS — I6381 Other cerebral infarction due to occlusion or stenosis of small artery: Secondary | ICD-10-CM

## 2020-05-06 DIAGNOSIS — G43009 Migraine without aura, not intractable, without status migrainosus: Secondary | ICD-10-CM | POA: Insufficient documentation

## 2020-05-06 DIAGNOSIS — M792 Neuralgia and neuritis, unspecified: Secondary | ICD-10-CM | POA: Diagnosis not present

## 2020-05-06 DIAGNOSIS — I639 Cerebral infarction, unspecified: Secondary | ICD-10-CM | POA: Insufficient documentation

## 2020-05-06 MED ORDER — GABAPENTIN 300 MG PO CAPS: 300.0000 mg | ORAL_CAPSULE | Freq: Four times a day (QID) | ORAL | 0 refills | Status: DC

## 2020-05-06 NOTE — Progress Notes (Signed)
Subjective:    Patient ID: Joyce [name], female    DOB: [birth year], 44 y.o.   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  She was seen by her nurse practitioner last month. She is walking with a rolling walker. She walks a lot within her home and sometimes outside the home. She finds that she fatigues easily. She walks on occasion without her walker. She has a flight of stairs to get up to her bedroom and typically has to stop half way up to take a break. Her biggest complaint is ongoing numbness in her left arm and leg. Often she has associated pain, especially in the evening when she goes to bed. Her primary increased her trazodone to 100 mg at bedtime but she still has problems sleeping and largely it is due to pain.  She has been going to outpt PT at neuro-rehab but this has been on hold since prior to Thanksgiving due to MCD re-cert  She hasn't had a headache in some time which is a huge positive for her.    Pain Inventory Average Pain 7 Pain Right Now 7 My pain is constant, tingling and aching  LOCATION OF PAIN  Left hand & fingers, left side of back, left leg & toes.  BOWEL Number of stools per week: 10 Oral laxative use No  Type of laxative - none Enema or suppository use No  History of colostomy No  Incontinent No   BLADDER Normal In and out cath, frequency N/A Able to self cath N/A Bladder incontinence No  Frequent urination No  Leakage with coughing No  Difficulty starting stream No  Incomplete bladder emptying No    Mobility:  Walker in use, climbs steps, needs help to transfer, walks with assistance.    Function disabled: date disabled applied for disability I need assistance with the following:  dressing, bathing, toileting, meal prep, household duties and shopping Do you have any goals in this area?  yes  Neuro/Psych weakness numbness tingling trouble  walking depression anxiety  Prior Studies     Physicians involved in your care     Family History  Problem Relation Age of Onset  . Diabetes Mother   . Hypertension Mother   . Kidney disease Mother   . Asthma Mother   . Hyperlipidemia Mother   . Alzheimer's disease Father   . Diabetes Father   . Kidney disease Sister   . Asthma Son   . Diabetes Sister   . Asthma Son   . ADD / ADHD Son   . Heart murmur Son   . Asthma Son    Social History   Socioeconomic History  . Marital status: Significant Other    Spouse name: [name]  . Number of children: 8  . Years of education: 38  . Highest education level: Not on file  Occupational History  . Occupation: unemployed  Tobacco Use  . Smoking status: Never Smoker  . Smokeless tobacco: Never Used  Vaping Use  . Vaping Use: Never used  Substance and Sexual Activity  . Alcohol use: Not Currently    Comment: occasionally  . Drug use: No  . Sexual activity: Yes    Birth control/protection: Injection  Other Topics Concern  . Not on file  Social History Narrative   Lives with 5 children   [name], his mother   Fiancee's two children   Social Determinants  of Health   Financial Resource Strain:   . Difficulty of Paying Living Expenses: Not on file  Food Insecurity:   . Worried About Charity fundraiser in the Last Year: Not on file  . Ran Out of Food in the Last Year: Not on file  Transportation Needs:   . Lack of Transportation (Medical): Not on file  . Lack of Transportation (Non-Medical): Not on file  Physical Activity:   . Days of Exercise per Week: Not on file  . Minutes of Exercise per Session: Not on file  Stress:   . Feeling of Stress : Not on file  Social Connections:   . Frequency of Communication with Friends and Family: Not on file  . Frequency of Social Gatherings with Friends and Family: Not on file  . Attends Religious Services: Not on file  . Active Member of Clubs or Organizations: Not on file  .  Attends Archivist Meetings: Not on file  . Marital Status: Not on file   Past Surgical History:  Procedure Laterality Date  . CHOLECYSTECTOMY     Past Medical History:  Diagnosis Date  . Acute CVA (cerebrovascular accident) ([place]) 03/04/2020  . AMA (advanced maternal age) multigravida 35+ 11/12/2014  . Anemia   . Depression   . Hypertension   . Migraine   . Pregnant 11/12/2014  . PTSD (post-traumatic stress disorder) 05/01/2017  . Round ligament pain 11/12/2014  . Short of breath on exertion 11/12/2014  . Trichomonas infection    BP 127/77   Pulse 95   Temp 98.3 F (36.8 C)   Ht 5\' 5"  (1.651 m)   Wt 234 lb 3.2 oz (106.2 kg)   SpO2 99%   BMI 38.97 kg/m   Opioid Risk Score:   Fall Risk Score:  `1  Depression screen PHQ 2/9  Depression screen [facility] 2/9 04/03/2020 12/01/2017 02/15/2017 12/07/2016 07/20/2016 07/20/2016 07/13/2016  Decreased Interest 2 0 0 0 1 1 2   Down, Depressed, Hopeless 3 2 0 0 3 3 2   PHQ - 2 Score 5 2 0 0 4 4 4   Altered sleeping 3 2 - - 0 - 0  Tired, decreased energy 2 3 - - 0 - 0  Change in appetite 2 0 - - 3 - 2  Feeling bad or failure about yourself  3 0 - - 3 - 1  Trouble concentrating 3 2 - - 3 - 2  Moving slowly or fidgety/restless 1 0 - - 0 - 0  Suicidal thoughts 0 0 - - 0 - 0  PHQ-9 Score 19 9 - - 13 - 9  Difficult doing work/chores - Somewhat difficult - - - - Somewhat difficult   Review of Systems  Constitutional: Positive for diaphoresis and unexpected weight change.       Weight gain  Musculoskeletal: Positive for back pain and gait problem.  Neurological: Positive for weakness and numbness.  Psychiatric/Behavioral:       Anxiety, depression  All other systems reviewed and are negative.      Objective:   Physical Exam Gen: no distress, normal appearing. Obese HEENT: oral mucosa pink and moist, NCAT Cardio: Reg rate Chest: normal effort, normal rate of breathing Abd: soft, non-distended Ext: no edema Skin: intact Neuro:  Patient is alert and oriented x3. She demonstrates reasonable insight and awareness. Cranial nerve exam is grossly intact except for decreased visual acuity. Gaze is conjugate although she does report blurred vision. Strength is 5 out of 5  right upper and right lower extremities. She is 4 out of 5 left upper extremity and 4 - to 4 out of 5 left lower extremity. Sensation is 1 out of 5 left upper left lower extremity with decreased fine motor coordination. She has a positive left pronator drift. She ambulates with her rolling walker and has reasonable weight shift as long as she is holding onto this. She needs extra time to transfer from sit to stand. Musculoskeletal: Normal passive range of motion and no obvious joint pain appreciated today. Psych: pleasant, normal affect        Assessment & Plan:  1.  Left-sided hemiparesis secondary to right thalamic infarction  -Secondary stroke prophylaxis with Plavix 75 mg daily and aspirin 325 mg daily  -Patient is already participating in outpatient physical therapy at [facility] neuro rehab. I made a referral for outpatient OT as well to address her left upper extremity coordination and ADLs  -Reiterated that I want her using her rolling walker at all times when she is ambulating on her own 2.  Pain management/history of migraines  -Remains on topiramate 25 mg daily. This is working nicely for her  -Gabapentin for neuropathic pain 300 mg 3 times daily   -Increased to 300 mg twice daily and 600 mg nightly   -Consider trial of nortriptyline if she has persistent neuropathic pain at night 3.  Mood: Lexapro 20 mg daily  -Alprazolam 0.25 mg daily as needed  -Trazodone 100 mg nightly for sleep. See #2 4.  Spasticity: Baclofen 5 mg 3 times daily  15 minutes was spent with the patient in examination and consultation today. I will see her back in about 2 months time. I asked her to call me in about 7 to 10 days if she is having persistent pain in the left leg that  keeps her awake.

## 2020-05-06 NOTE — Patient Instructions (Addendum)
IF YOU'RE STILL HAVING PAIN AFTER ANOTHER 7-10 DAYS AT NIGHT, THEN CALL ME.   PLEASE FEEL FREE TO CALL OUR OFFICE WITH ANY PROBLEMS OR QUESTIONS ([phone])                                @                 @@               @@@                        @@@@                      @@@@@         @@@@@@                  @@@@@@@                @@@@@@@@              @@@@@@@@@             @@@@@@@@@@       IIII                  IIII                                                        HAPPY HOLIDAYS!!!!!

## 2020-05-07 ENCOUNTER — Ambulatory Visit (HOSPITAL_COMMUNITY): Payer: Medicaid Other | Admitting: Physical Therapy

## 2020-05-08 ENCOUNTER — Ambulatory Visit (HOSPITAL_COMMUNITY): Payer: Medicaid Other | Admitting: Physical Therapy

## 2020-05-08 ENCOUNTER — Other Ambulatory Visit: Payer: Self-pay

## 2020-05-08 ENCOUNTER — Encounter (HOSPITAL_COMMUNITY): Payer: Self-pay | Admitting: Physical Therapy

## 2020-05-08 DIAGNOSIS — I639 Cerebral infarction, unspecified: Secondary | ICD-10-CM | POA: Diagnosis not present

## 2020-05-08 DIAGNOSIS — M6281 Muscle weakness (generalized): Secondary | ICD-10-CM

## 2020-05-08 DIAGNOSIS — F331 Major depressive disorder, recurrent, moderate: Secondary | ICD-10-CM

## 2020-05-08 DIAGNOSIS — R262 Difficulty in walking, not elsewhere classified: Secondary | ICD-10-CM

## 2020-05-08 DIAGNOSIS — I6381 Other cerebral infarction due to occlusion or stenosis of small artery: Secondary | ICD-10-CM

## 2020-05-08 NOTE — Therapy (Signed)
[place], [place], [address] Phone: [phone]   Fax:  [phone]  Physical Therapy Treatment  Patient Details  Name: Joyce [name] MRN: [id number] Date of Birth: March 14, 1976 Referring Provider (PT): Lauraine Rinne PA   Encounter Date: 05/08/2020   PT End of Session - 05/08/20 1544    Visit Number 6    Number of Visits 12    Date for PT Re-Evaluation 05/27/20    Authorization Type self -pay, [employer]-awaiting approval    Progress Note Due on Visit 10    PT Start Time 1532    PT Stop Time 1610    PT Time Calculation (min) 38 min    Equipment Utilized During Treatment Gait belt    Activity Tolerance Patient limited by fatigue    Behavior During Therapy [facility] for tasks assessed/performed           Past Medical History:  Diagnosis Date  . Acute CVA (cerebrovascular accident) ([place]) 03/04/2020  . AMA (advanced maternal age) multigravida 35+ 11/12/2014  . Anemia   . Depression   . Hypertension   . Migraine   . Pregnant 11/12/2014  . PTSD (post-traumatic stress disorder) 05/01/2017  . Round ligament pain 11/12/2014  . Short of breath on exertion 11/12/2014  . Trichomonas infection     Past Surgical History:  Procedure Laterality Date  . CHOLECYSTECTOMY      There were no vitals filed for this visit.   Subjective Assessment - 05/08/20 1537    Subjective Patient reports she is feeling better and working on her exercises at home. She reports she has been trying to walk more for longer periods of time. She reports no new symptoms and LLE continues to demo weakness    Pertinent History HTN, Migraine, PTSD, Acute CVA 03/04/20    How long can you walk comfortably? 5-10 with rollator    Currently in Pain? No/denies                             [facility] Adult PT Treatment/Exercise - 05/08/20 0001      Neuro Re-ed    Neuro Re-ed Details   static standing with split stance to increase  single limb weight acceptance 2x10 sec for 3 trials with lead LE elevated on 4" step. Static standing with wide BOS to improve awareness of limits of stability, performed in parallel bars for safety 3x15 sec     Knee/Hip Exercises: Standing   Other Standing Knee Exercises standing hip flexion 3x10 to improve foot clearance in gait     Knee/Hip Exercises: Seated   Long Arc Quad Strengthening;3 sets;10 reps   4 lbs RLE and no added LLE                 PT Education - 05/08/20 1543    Education Details Pt educated in new additions to HEP and safety techniques for standing    Person(s) Educated Patient    Methods Explanation    Comprehension Verbalized understanding            PT Short Term Goals - 04/01/20 1231      PT SHORT TERM GOAL #1   Title Patient will report at least 25% improvement in overall symptoms and/or function to demonstrate improved functional mobility    Time 4    Period Weeks    Status New    Target Date 04/29/20  PT SHORT TERM GOAL #2   Title Patient will be independent in self management strategies to improve quality of life and functional outcomes.    Time 4    Period Weeks    Status New    Target Date 04/29/20      PT SHORT TERM GOAL #3   Title Patient will be able to transition from sit to stand without use of upper extremities to improve transitional mobility    Time 4    Period Weeks    Status New    Target Date 04/29/20             PT Long Term Goals - 04/01/20 1231      PT LONG TERM GOAL #1   Title Patient will report at least 50% improvement in overall symptoms and/or function to demonstrate improved functional mobility    Time 8    Period Weeks    Status New    Target Date 05/27/20      PT LONG TERM GOAL #2   Title Patient will be able to ascend and descend stairs with use of railing and step through gait pattern to improve ability to go up to bedroom    Time 8    Period Weeks    Status New    Target Date 05/27/20       PT LONG TERM GOAL #3   Title Patient will be able to ambulate at least 226 feet in 2 minutes without use of Assistive device to improve ability to walk in community    Time 8    Period Weeks    Status New    Target Date 05/27/20                 Plan - 05/08/20 1547    Clinical Impression Statement Continues to exhibit generalized weakness with more obvious weakness and coordination deficits in LLE causing decreased gait velocity and poor left foot clearance in swing phase with limited hip flexion and knee extension noted at initial contact due to poor strength/control.  Patient would benefit from continued treatments to improve strength and progress ambulation capabilities to reduce risk for falls.    Personal Factors and Comorbidities Comorbidity 1;Comorbidity 2    Comorbidities migraines, HTN    Examination-Activity Limitations Locomotion Level;Lift;Transfers;Toileting;Stand;Stairs;Squat;Carry;Bathing    Examination-Participation Restrictions Cleaning;Community Activity;Meal Prep;Shop;Occupation    Stability/Clinical Decision Making Stable/Uncomplicated    Rehab Potential Good    PT Frequency --   1-2x/week for total of 12 visits over 8 week certification period   PT Duration 8 weeks    PT Treatment/Interventions ADLs/Self Care Home Management;Aquatic Therapy;Cryotherapy;Electrical Stimulation;Moist Heat;Traction;Balance training;Therapeutic exercise;Therapeutic activities;Functional mobility training;Stair training;Gait training;DME Instruction;Neuromuscular re-education;Patient/family education;Orthotic Fit/Training;Manual techniques;Energy conservation;Dry needling;Passive range of motion    PT Next Visit Plan LE strengthening.  focus on muscle activation, walking mechanics, transfers without using UE to mobilize Lt LE, standing exercises.  Progress standing and activie exericse.    PT Home Exercise Plan LAQs, DF           Patient will benefit from skilled therapeutic  intervention in order to improve the following deficits and impairments:  Decreased coordination,Improper body mechanics,Decreased range of motion,Decreased balance,Decreased mobility,Difficulty walking,Pain,Decreased strength,Decreased activity tolerance,Decreased knowledge of use of DME,Abnormal gait,Decreased endurance  Visit Diagnosis: Difficulty in walking, not elsewhere classified  Muscle weakness (generalized)  Right thalamic infarction [facility])  MDD (major depressive disorder), recurrent episode, moderate ([place])     Problem List Patient Active Problem List  Diagnosis Date Noted  . Neuropathic pain 05/06/2020  . Encounter for IUD insertion 04/20/2020  . Anxiety state   . Dyslipidemia   . Labile blood pressure   . Migraine without status migrainosus, not intractable   . Right thalamic infarction ([place]) 03/10/2020  . Left hemiparesis ([place]) 03/10/2020  . Recurrent falls 03/05/2020  . Leukocytosis 03/05/2020  . Acute left-sided weakness 03/05/2020  . Acute CVA (cerebrovascular accident) ([place]) 03/04/2020  . MDD (major depressive disorder), recurrent episode, moderate ([place]) 05/01/2017  . PTSD (post-traumatic stress disorder) 05/01/2017  . Depression, recurrent ([place]) 12/07/2016  . Essential hypertension 07/20/2016  . Uterine fibroid 01/27/2016  . 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 [place] multiparity 11/26/2014   4:16 PM, 05/08/20 M. Sherlyn Lees, PT, DPT Physical Therapist- [place] Office Number: [phone]  [place] [address] [place], [place], [address] Phone: [phone]   Fax:  [phone]  Name: ANGINETTE ESPEJO MRN: [id number] Date of Birth: Jan 06, 1976

## 2020-05-12 ENCOUNTER — Other Ambulatory Visit: Payer: Self-pay

## 2020-05-12 ENCOUNTER — Encounter (HOSPITAL_COMMUNITY): Payer: Self-pay | Admitting: Physical Therapy

## 2020-05-12 ENCOUNTER — Ambulatory Visit (HOSPITAL_COMMUNITY): Payer: Medicaid Other | Admitting: Physical Therapy

## 2020-05-12 DIAGNOSIS — I639 Cerebral infarction, unspecified: Secondary | ICD-10-CM | POA: Diagnosis not present

## 2020-05-12 DIAGNOSIS — M6281 Muscle weakness (generalized): Secondary | ICD-10-CM | POA: Diagnosis not present

## 2020-05-12 DIAGNOSIS — R262 Difficulty in walking, not elsewhere classified: Secondary | ICD-10-CM | POA: Diagnosis not present

## 2020-05-12 DIAGNOSIS — F331 Major depressive disorder, recurrent, moderate: Secondary | ICD-10-CM | POA: Diagnosis not present

## 2020-05-12 DIAGNOSIS — I6381 Other cerebral infarction due to occlusion or stenosis of small artery: Secondary | ICD-10-CM

## 2020-05-12 NOTE — Therapy (Signed)
[place], [place], [address] Phone: [phone]   Fax:  [phone]  Physical Therapy Treatment  Patient Details  Name: Joyce [name] MRN: [id number] Date of Birth: [birth year] Referring Provider (PT): Lauraine Rinne PA   Encounter Date: 05/12/2020   PT End of Session - 05/12/20 1150    Visit Number 7    Number of Visits 12    Date for PT Re-Evaluation 05/27/20    Authorization Type self -pay, [employer]-awaiting approval    Progress Note Due on Visit 10    PT Start Time 1132    PT Stop Time 1212    PT Time Calculation (min) 40 min    Equipment Utilized During Treatment Gait belt    Activity Tolerance Patient limited by fatigue    Behavior During Therapy [facility] for tasks assessed/performed           Past Medical History:  Diagnosis Date   Acute CVA (cerebrovascular accident) ([place]) 03/04/2020   AMA (advanced maternal age) multigravida 35+ 11/12/2014   Anemia    Depression    Hypertension    Migraine    Pregnant 11/12/2014   PTSD (post-traumatic stress disorder) 05/01/2017   Round ligament pain 11/12/2014   Short of breath on exertion 11/12/2014   Trichomonas infection     Past Surgical History:  Procedure Laterality Date   CHOLECYSTECTOMY      There were no vitals filed for this visit.   Subjective Assessment - 05/12/20 1137    Subjective Patient reports feeling pretty good today and that she has been "doing leg lifts at home" and trying to walk around the house more over the past few days.    Pertinent History HTN, Migraine, PTSD, Acute CVA 03/04/20    How long can you walk comfortably? 5-10 with rollator    Currently in Pain? No/denies    Pain Score 0-No pain                             [facility] Adult PT Treatment/Exercise - 05/12/20 0001      Knee/Hip Exercises: Standing   Other Standing Knee Exercises side stepping w/ 2.5 lbs ankle weights 4x5 ft left/right       Knee/Hip Exercises: Seated   Long Arc Quad Strengthening;Both;3 sets;10 reps    Long Arc Quad Weight --   2.5 LLE and 5 lbs RLE   Ball Squeeze 3x10, 2 sec hold    Other Seated Knee/Hip Exercises heel raise, dorsiflexion 2x10    Sit to Sand 2 sets;5 reps   with CGA for forward trunk flexion and eccentric control                 PT Education - 05/12/20 1206    Education Details Patient educated in benefits of increased cardiovascular exercise to perform at home in seated manner for safety, e.g. seated dancing    Person(s) Educated Patient    Methods Explanation    Comprehension Verbalized understanding            PT Short Term Goals - 04/01/20 1231      PT SHORT TERM GOAL #1   Title Patient will report at least 25% improvement in overall symptoms and/or function to demonstrate improved functional mobility    Time 4    Period Weeks    Status New    Target Date 04/29/20  PT SHORT TERM GOAL #2   Title Patient will be independent in self management strategies to improve quality of life and functional outcomes.    Time 4    Period Weeks    Status New    Target Date 04/29/20      PT SHORT TERM GOAL #3   Title Patient will be able to transition from sit to stand without use of upper extremities to improve transitional mobility    Time 4    Period Weeks    Status New    Target Date 04/29/20             PT Long Term Goals - 04/01/20 1231      PT LONG TERM GOAL #1   Title Patient will report at least 50% improvement in overall symptoms and/or function to demonstrate improved functional mobility    Time 8    Period Weeks    Status New    Target Date 05/27/20      PT LONG TERM GOAL #2   Title Patient will be able to ascend and descend stairs with use of railing and step through gait pattern to improve ability to go up to bedroom    Time 8    Period Weeks    Status New    Target Date 05/27/20      PT LONG TERM GOAL #3   Title Patient will be able to  ambulate at least 226 feet in 2 minutes without use of Assistive device to improve ability to walk in community    Time 8    Period Weeks    Status New    Target Date 05/27/20                 Plan - 05/12/20 1150    Clinical Impression Statement Continues to exhibit profound LLE weakness and difficulty with rapid alternating movements and decreased gross motor coordination appreciated leading to gait abnormalities and decreased velocity.  Patient would benefit from continued PT sessions to improve strength, activity tolerance, and facilitate coordination to improve dynamic balance and ambulation with reduced risk for falls    Personal Factors and Comorbidities Comorbidity 1;Comorbidity 2    Comorbidities migraines, HTN    Examination-Activity Limitations Locomotion Level;Lift;Transfers;Toileting;Stand;Stairs;Squat;Carry;Bathing    Examination-Participation Restrictions Cleaning;Community Activity;Meal Prep;Shop;Occupation    Stability/Clinical Decision Making Stable/Uncomplicated    Rehab Potential Good    PT Frequency --   1-2x/week for total of 12 visits over 8 week certification period   PT Duration 8 weeks    PT Treatment/Interventions ADLs/Self Care Home Management;Aquatic Therapy;Cryotherapy;Electrical Stimulation;Moist Heat;Traction;Balance training;Therapeutic exercise;Therapeutic activities;Functional mobility training;Stair training;Gait training;DME Instruction;Neuromuscular re-education;Patient/family education;Orthotic Fit/Training;Manual techniques;Energy conservation;Dry needling;Passive range of motion    PT Next Visit Plan LE strengthening.  focus on muscle activation, walking mechanics, transfers without using UE to mobilize Lt LE, standing exercises.  Progress with open chain PRE and dynamic standing activities    PT Home Exercise Plan LAQs, DF    Consulted and Agree with Plan of Care Patient           Patient will benefit from skilled therapeutic intervention in  order to improve the following deficits and impairments:  Decreased coordination,Improper body mechanics,Decreased range of motion,Decreased balance,Decreased mobility,Difficulty walking,Pain,Decreased strength,Decreased activity tolerance,Decreased knowledge of use of DME,Abnormal gait,Decreased endurance  Visit Diagnosis: Difficulty in walking, not elsewhere classified  Muscle weakness (generalized)  Right thalamic infarction [facility])  MDD (major depressive disorder), recurrent episode, moderate ([place])     Problem  List Patient Active Problem List   Diagnosis Date Noted   Neuropathic pain 05/06/2020   Encounter for IUD insertion 04/20/2020   Anxiety state    Dyslipidemia    Labile blood pressure    Migraine without status migrainosus, not intractable    Right thalamic infarction ([place]) 03/10/2020   Left hemiparesis ([place]) 03/10/2020   Recurrent falls 03/05/2020   Leukocytosis 03/05/2020   Acute left-sided weakness 03/05/2020   Acute CVA (cerebrovascular accident) ([place]) 03/04/2020   MDD (major depressive disorder), recurrent episode, moderate ([place]) 05/01/2017   PTSD (post-traumatic stress disorder) 05/01/2017   Depression, recurrent ([place]) 12/07/2016   Essential hypertension 07/20/2016   Uterine fibroid 01/27/2016   Cervical high risk HPV (human papillomavirus) test positive 12/08/2014   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    12:18 PM, 05/12/20 M. Sherlyn Lees, PT, DPT Physical Therapist- [place] Office Number: [phone]  [place] [address] [place], [place], [address] Phone: [phone]   Fax:  [phone]  Name: COSETTE PRINDLE MRN: [id number] Date of Birth: 10/19/1975

## 2020-05-14 ENCOUNTER — Encounter (HOSPITAL_COMMUNITY): Payer: Self-pay

## 2020-05-14 ENCOUNTER — Telehealth (HOSPITAL_COMMUNITY): Payer: Self-pay

## 2020-05-14 ENCOUNTER — Ambulatory Visit (HOSPITAL_COMMUNITY): Payer: Medicaid Other

## 2020-05-14 NOTE — Telephone Encounter (Signed)
No show, called and left message concerning missed apt today.  Reminded next apt date and time with contact information included if needs to cancel or reschedule.  Ihor Austin, LPTA/CLT; Delana Meyer [phone]

## 2020-05-15 ENCOUNTER — Other Ambulatory Visit: Payer: Self-pay

## 2020-05-15 ENCOUNTER — Ambulatory Visit (INDEPENDENT_AMBULATORY_CARE_PROVIDER_SITE_OTHER): Payer: Medicaid Other | Admitting: Nurse Practitioner

## 2020-05-15 ENCOUNTER — Encounter: Payer: Self-pay | Admitting: Nurse Practitioner

## 2020-05-15 VITALS — BP 119/75 | HR 86 | Temp 98.4°F | Resp 20 | Ht 65.0 in | Wt 231.0 lb

## 2020-05-15 DIAGNOSIS — Z7689 Persons encountering health services in other specified circumstances: Secondary | ICD-10-CM | POA: Diagnosis not present

## 2020-05-15 DIAGNOSIS — Z139 Encounter for screening, unspecified: Secondary | ICD-10-CM

## 2020-05-15 DIAGNOSIS — I6930 Unspecified sequelae of cerebral infarction: Secondary | ICD-10-CM

## 2020-05-15 DIAGNOSIS — F339 Major depressive disorder, recurrent, unspecified: Secondary | ICD-10-CM | POA: Diagnosis not present

## 2020-05-15 DIAGNOSIS — F411 Generalized anxiety disorder: Secondary | ICD-10-CM

## 2020-05-15 DIAGNOSIS — E785 Hyperlipidemia, unspecified: Secondary | ICD-10-CM

## 2020-05-15 DIAGNOSIS — G8194 Hemiplegia, unspecified affecting left nondominant side: Secondary | ICD-10-CM

## 2020-05-15 DIAGNOSIS — M792 Neuralgia and neuritis, unspecified: Secondary | ICD-10-CM

## 2020-05-15 DIAGNOSIS — G43009 Migraine without aura, not intractable, without status migrainosus: Secondary | ICD-10-CM | POA: Diagnosis not present

## 2020-05-15 NOTE — Assessment & Plan Note (Signed)
-  no issues today -takes escitalopram daily as well as alprazolam PRN

## 2020-05-15 NOTE — Patient Instructions (Signed)
For depression, I sent a referral to a therapist, and they should reach out to you for scheduling.  If you haven't heard anything in a week, please let me know and I can send a referral to a difffernt therapist.

## 2020-05-15 NOTE — Assessment & Plan Note (Signed)
-  obtain medical records -will screen for HCV with next set of labs

## 2020-05-15 NOTE — Assessment & Plan Note (Addendum)
-  reviewed records from hospital -had CVA on 03/04/20 -resulted in left-sided weakness -followed by Dr. Naaman Plummer with physical medicine -taking high dose ASA and clopidogrel for dual antiplatelet therapy

## 2020-05-15 NOTE — Assessment & Plan Note (Signed)
-  she states she has neurology visit with a Dr. Kavin Leech on 06/10/19 -followed by Dr. Naaman Plummer for physical medicine -has weakness, but not complete paralysis

## 2020-05-15 NOTE — Progress Notes (Signed)
New Patient Office Visit  Subjective:  Patient ID: Joyce [name], female    DOB: 1976/02/18  Age: 44 y.o. MRN: [id number]  CC:  Chief Complaint  Patient presents with  . New Patient (Initial Visit)    Wants to follow up on mammogram     HPI Joyce [name] presents for new patient visit.  Transferring care from Dr. Ali Lowe in [place]. Last physical was over a year ago. Last labs were drawn in October 2021. She had a CVA on 03/04/20 that resulted in left-sided weakness.  She has hx of HTN, depression, migraine, and medical noncompliance.  Past Medical History:  Diagnosis Date  . Acute CVA (cerebrovascular accident) ([place]) 03/04/2020  . Acute CVA (cerebrovascular accident) ([place]) 03/04/2020  . Acute left-sided weakness 03/05/2020  . AMA (advanced maternal age) multigravida 35+ 11/12/2014  . Anemia   . Depression   . [place] multiparity 11/26/2014  . Hypertension   . Migraine   . Pregnant 11/12/2014  . PTSD (post-traumatic stress disorder) 05/01/2017  . Round ligament pain 11/12/2014  . Short of breath on exertion 11/12/2014  . Trichomonas infection     Past Surgical History:  Procedure Laterality Date  . CHOLECYSTECTOMY      Family History  Problem Relation Age of Onset  . Diabetes Mother   . Hypertension Mother   . Kidney disease Mother   . Asthma Mother   . Hyperlipidemia Mother   . Alzheimer's disease Father   . Diabetes Father   . Kidney disease Sister   . Asthma Son   . Diabetes Sister   . Asthma Son   . ADD / ADHD Son   . Heart murmur Son   . Asthma Son     Social History   Socioeconomic History  . Marital status: Significant Other    Spouse name: [name]  . Number of children: 8  . Years of education: 70  . Highest education level: Not on file  Occupational History  . Occupation: unemployed    Comment: disabled  Tobacco Use  . Smoking status: Never Smoker  . Smokeless tobacco: Never Used  Vaping Use  . Vaping Use: Never used  Substance and  Sexual Activity  . Alcohol use: Not Currently    Comment: occasionally  . Drug use: No  . Sexual activity: Yes    Birth control/protection: I.U.D.    Comment: followed by GYN  Other Topics Concern  . Not on file  Social History Narrative   Lives with 5 children   [name], his mother   Fiancee's two children   Social Determinants of Health   Financial Resource Strain: Not on file  Food Insecurity: Not on file  Transportation Needs: Not on file  Physical Activity: Not on file  Stress: Not on file  Social Connections: Not on file  Intimate Partner Violence: Not on file    ROS Review of Systems  Objective:   Today's Vitals: BP 119/75   Pulse 86   Temp 98.4 F (36.9 C)   Resp 20   Ht '5\' 5"'  (1.651 m)   Wt 231 lb (104.8 kg)   SpO2 98%   BMI 38.44 kg/m   Physical Exam  Assessment & Plan:   Problem List Items Addressed This Visit      Cardiovascular and Mediastinum   Migraine without status migrainosus, not intractable    -no issues today -she states the frequency of these has greatly decreased after her stroke -takes topiramate 25  mg daily        Nervous and Auditory   Left hemiparesis [facility])    -she states she has neurology visit with a Dr. Kavin Leech on 06/10/19 -followed by Dr. Naaman Plummer for physical medicine -has weakness, but not complete paralysis        Other   Depression, recurrent ([place])    -has increased depression after suffering a CVA 03/04/20 -takes escitalopram 20 mg daily -she would like referral to counseling, and that sounds like a great idea      Relevant Orders   Ambulatory referral to Psychology   Anxiety state    -no issues today -takes escitalopram daily as well as alprazolam PRN      Dyslipidemia    -no labs to review today -taking atorvastatin 80 mg daily      Relevant Orders   Lipid Panel With LDL/HDL Ratio   Encounter to establish care - Primary    -obtain medical records -will screen for HCV with next set of labs       Relevant Orders   CBC with Differential/Platelet   CMP14+EGFR   HCV Ab w/Rflx to Verification   Neuropathic pain    -affects her left hand, left leg, and left side of her face -takes gabapentin 300 mg QID -no longer taking oxycodone      History of CVA with residual deficit    -reviewed records from hospital -had CVA on 03/04/20 -resulted in left-sided weakness -followed by Dr. Naaman Plummer with physical medicine -taking high dose ASA and clopidogrel for dual antiplatelet therapy       Other Visit Diagnoses    Screening due       Relevant Orders   HCV Ab w/Rflx to Verification      Outpatient Encounter Medications as of 05/15/2020  Medication Sig  . ALPRAZolam (XANAX) 0.25 MG tablet Take 1 tablet (0.25 mg total) by mouth daily as needed for anxiety.  [name] amLODipine (NORVASC) 5 MG tablet Take 1 tablet (5 mg total) by mouth daily.  [name] aspirin 325 MG EC tablet Take 1 tablet (325 mg total) by mouth daily.  [name] atorvastatin (LIPITOR) 80 MG tablet Take 1 tablet (80 mg total) by mouth daily.  . Baclofen 5 MG TABS Take 1 tablet by mouth 3 (three) times daily.  . clopidogrel (PLAVIX) 75 MG tablet Take 1 tablet (75 mg total) by mouth daily with breakfast.  . escitalopram (LEXAPRO) 20 MG tablet Take 1 tablet (20 mg total) by mouth daily.  [name] gabapentin (NEURONTIN) 300 MG capsule Take 1 capsule (300 mg total) by mouth 4 (four) times daily. 1 cap twice daily and 2 caps at bedtime  . topiramate (TOPAMAX) 25 MG tablet Take 1 tablet (25 mg total) by mouth daily.  . traZODone (DESYREL) 100 MG tablet Take 100 mg by mouth at bedtime.  . [DISCONTINUED] oxyCODONE (OXY IR/ROXICODONE) 5 MG immediate release tablet Take 1 tablet (5 mg total) by mouth every 12 (twelve) hours as needed for severe pain.   No facility-administered encounter medications on file as of 05/15/2020.    Follow-up: Return in about 5 weeks (around 06/19/2020) for Physical Exam.   Noreene Larsson, NP

## 2020-05-15 NOTE — Assessment & Plan Note (Signed)
-  no labs to review today -taking atorvastatin 80 mg daily

## 2020-05-15 NOTE — Assessment & Plan Note (Addendum)
-  has increased depression after suffering a CVA 03/04/20 -takes escitalopram 20 mg daily -she would like referral to counseling, and that sounds like a great idea

## 2020-05-15 NOTE — Assessment & Plan Note (Signed)
-  affects her left hand, left leg, and left side of her face -takes gabapentin 300 mg QID -no longer taking oxycodone

## 2020-05-15 NOTE — Assessment & Plan Note (Signed)
-  no issues today -she states the frequency of these has greatly decreased after her stroke -takes topiramate 25 mg daily

## 2020-05-18 ENCOUNTER — Other Ambulatory Visit: Payer: Self-pay

## 2020-05-18 ENCOUNTER — Encounter: Payer: Self-pay | Admitting: Women's Health

## 2020-05-18 ENCOUNTER — Ambulatory Visit (INDEPENDENT_AMBULATORY_CARE_PROVIDER_SITE_OTHER): Payer: Medicaid Other | Admitting: Women's Health

## 2020-05-18 ENCOUNTER — Ambulatory Visit (HOSPITAL_COMMUNITY): Payer: Medicaid Other | Admitting: Physical Therapy

## 2020-05-18 VITALS — BP 145/86 | HR 80 | Ht 65.0 in | Wt 237.0 lb

## 2020-05-18 DIAGNOSIS — F331 Major depressive disorder, recurrent, moderate: Secondary | ICD-10-CM | POA: Diagnosis not present

## 2020-05-18 DIAGNOSIS — Z30431 Encounter for routine checking of intrauterine contraceptive device: Secondary | ICD-10-CM | POA: Diagnosis not present

## 2020-05-18 DIAGNOSIS — M6281 Muscle weakness (generalized): Secondary | ICD-10-CM

## 2020-05-18 DIAGNOSIS — R262 Difficulty in walking, not elsewhere classified: Secondary | ICD-10-CM

## 2020-05-18 DIAGNOSIS — I6381 Other cerebral infarction due to occlusion or stenosis of small artery: Secondary | ICD-10-CM

## 2020-05-18 DIAGNOSIS — I639 Cerebral infarction, unspecified: Secondary | ICD-10-CM | POA: Diagnosis not present

## 2020-05-18 NOTE — Therapy (Addendum)
[place], [place], [address] Phone: [phone]   Fax:  [phone]  Physical Therapy Treatment  Patient Details  Name: Joyce [name] MRN: [id number] Date of Birth: 05-11-1976 Referring Provider (PT): Lauraine Rinne PA   Encounter Date: 05/18/2020   PT End of Session - 05/18/20 1154    Visit Number 8    Number of Visits 12    Date for PT Re-Evaluation 05/27/20    Authorization Type self -pay, [employer]-approved 12 visits from 11/30-12/30    Authorization - Visit Number 3    Authorization - Number of Visits 12    Progress Note Due on Visit 10    PT Start Time 1133    PT Stop Time 1213    PT Time Calculation (min) 40 min    Equipment Utilized During Treatment Gait belt    Activity Tolerance Patient limited by fatigue    Behavior During Therapy [facility] for tasks assessed/performed           Past Medical History:  Diagnosis Date  . Acute CVA (cerebrovascular accident) ([place]) 03/04/2020  . Acute CVA (cerebrovascular accident) ([place]) 03/04/2020  . Acute left-sided weakness 03/05/2020  . AMA (advanced maternal age) multigravida 35+ 11/12/2014  . Anemia   . Depression   . [place] multiparity 11/26/2014  . Hypertension   . Migraine   . Pregnant 11/12/2014  . PTSD (post-traumatic stress disorder) 05/01/2017  . Round ligament pain 11/12/2014  . Short of breath on exertion 11/12/2014  . Trichomonas infection     Past Surgical History:  Procedure Laterality Date  . CHOLECYSTECTOMY      There were no vitals filed for this visit.   Subjective Assessment - 05/18/20 1153    Subjective Patient reports her ankles have been swelling past couple of nights.  Patient denies any other symptoms and denies change in diet or medication.    Pertinent History HTN, Migraine, PTSD, Acute CVA 03/04/20    How long can you walk comfortably? 5-10 with rollator    Currently in Pain? No/denies    Pain Score 0-No pain               [facility] PT Assessment - 05/18/20 0001      Assessment   Medical Diagnosis left hemiplegia    Referring Provider (PT) Lauraine Rinne PA                         [facility]Exercise - 05/18/20 0001      Knee/Hip Exercises: Aerobic   Nustep seat level/arm level 10   resistance level 1 x 8 min to improve cardiovasular health/activity tolerance     Knee/Hip Exercises: Standing   Other Standing Knee Exercises stair taps x 2 min 6" step with 5 lbs weights. Very difficult to perform with weights. 1x2 min without added weight    Other Standing Knee Exercises static standing with single foot elevated on 6" step 3x15 sec for single limb support      Knee/Hip Exercises: Seated   Long Arc Quad Strengthening;Both;3 sets;10 reps    Long Arc Quad Weight 5 lbs.    Ball Squeeze 3x10, 2 sec hold                  PT Education - 05/18/20 1153    Education Details patient educated in blood pressure monitoring due to her medical history. Therapist checks BP via manual  and reveals 140/70 mmHg after 8 min NUstep    Person(s) Educated Patient    Methods Explanation    Comprehension Verbalized understanding            PT Short Term Goals - 04/01/20 1231      PT SHORT TERM GOAL #1   Title Patient will report at least 25% improvement in overall symptoms and/or function to demonstrate improved functional mobility    Time 4    Period Weeks    Status New    Target Date 04/29/20      PT SHORT TERM GOAL #2   Title Patient will be independent in self management strategies to improve quality of life and functional outcomes.    Time 4    Period Weeks    Status New    Target Date 04/29/20      PT SHORT TERM GOAL #3   Title Patient will be able to transition from sit to stand without use of upper extremities to improve transitional mobility    Time 4    Period Weeks    Status New    Target Date 04/29/20             PT Long Term Goals - 04/01/20 1231       PT LONG TERM GOAL #1   Title Patient will report at least 50% improvement in overall symptoms and/or function to demonstrate improved functional mobility    Time 8    Period Weeks    Status New    Target Date 05/27/20      PT LONG TERM GOAL #2   Title Patient will be able to ascend and descend stairs with use of railing and step through gait pattern to improve ability to go up to bedroom    Time 8    Period Weeks    Status New    Target Date 05/27/20      PT LONG TERM GOAL #3   Title Patient will be able to ambulate at least 226 feet in 2 minutes without use of Assistive device to improve ability to walk in community    Time 8    Period Weeks    Status New    Target Date 05/27/20                 Plan - 05/18/20 1157    Clinical Impression Statement Patient continues to exhibit LLE weakness and ambulates with decreased amplitude of movement and reduced velocity with decreased stride length and heavy reliance on UE support through rollator. Patient would continue to benefit from PT services to improve strength, balance, and activity tolerance to facilitate ambulation with less restrictive AD and increase gait velocity. Difficulty with achieving left foot clearance to 6" step requiring tactile cues to coordinate and facilitate position/performance for left hip/quadricep. Poor activity tolerance requiring frequent therapeutic rest periods due to onset of fatigue in LLE    Personal Factors and Comorbidities Comorbidity 1;Comorbidity 2    Comorbidities migraines, HTN    Examination-Activity Limitations Locomotion Level;Lift;Transfers;Toileting;Stand;Stairs;Squat;Carry;Bathing    Examination-Participation Restrictions Cleaning;Community Activity;Meal Prep;Shop;Occupation    Stability/Clinical Decision Making Stable/Uncomplicated    Rehab Potential Good    PT Frequency --   1-2x/week for total of 12 visits over 8 week certification period   PT Duration 8 weeks    PT  Treatment/Interventions ADLs/Self Care Home Management;Aquatic Therapy;Cryotherapy;Electrical Stimulation;Moist Heat;Traction;Balance training;Therapeutic exercise;Therapeutic activities;Functional mobility training;Stair training;Gait training;DME Instruction;Neuromuscular re-education;Patient/family education;Orthotic Fit/Training;Manual techniques;Energy conservation;Dry needling;Passive range of  motion    PT Next Visit Plan LE strengthening.  focus on muscle activation, walking mechanics, transfers without using UE to mobilize Lt LE, standing exercises.  Progress with open chain PRE and dynamic standing activities    PT Home Exercise Plan LAQs, DF    Consulted and Agree with Plan of Care Patient           Patient will benefit from skilled therapeutic intervention in order to improve the following deficits and impairments:  Decreased coordination,Improper body mechanics,Decreased range of motion,Decreased balance,Decreased mobility,Difficulty walking,Pain,Decreased strength,Decreased activity tolerance,Decreased knowledge of use of DME,Abnormal gait,Decreased endurance  Visit Diagnosis: Difficulty in walking, not elsewhere classified  Muscle weakness (generalized)  Right thalamic infarction [facility])  MDD (major depressive disorder), recurrent episode, moderate ([place])     Problem List Patient Active Problem List   Diagnosis Date Noted  . History of CVA with residual deficit 05/15/2020  . Neuropathic pain 05/06/2020  . Encounter to establish care 04/20/2020  . Anxiety state   . Dyslipidemia   . Labile blood pressure   . Migraine without status migrainosus, not intractable   . Right thalamic infarction ([place]) 03/10/2020  . Left hemiparesis ([place]) 03/10/2020  . Recurrent falls 03/05/2020  . Leukocytosis 03/05/2020  . MDD (major depressive disorder), recurrent episode, moderate ([place]) 05/01/2017  . PTSD (post-traumatic stress disorder) 05/01/2017  . Depression, recurrent ([place])  12/07/2016  . Essential hypertension 07/20/2016  . Uterine fibroid 01/27/2016  . Cervical high risk HPV (human papillomavirus) test positive 12/08/2014    12:29 PM, 05/18/20 M. Sherlyn Lees, PT, DPT Physical Therapist- [place] Office Number: [phone]  [place] [address] [place], [place], [address] Phone: [phone]   Fax:  [phone]  Name: Joyce [name] MRN: [id number] Date of Birth: [birth year]

## 2020-05-18 NOTE — Progress Notes (Signed)
   GYN VISIT Patient name: Joyce [name] MRN [id number]  Date of birth: [birth year] Chief Complaint:   IUD check  History of Present Illness:   Joyce [name] is a 44 y.o. [id number] African American female being seen today for IUD check.  Paragard IUD inserted 04/20/20. Bleeding since insertion. Tried to feel strings, but couldn't. No pain. Hasn't had sex.  Depression screen [facility] 2/9 05/15/2020 05/06/2020 04/03/2020 12/01/2017 02/15/2017  Decreased Interest 1 1 2  0 0  Down, Depressed, Hopeless 1 1 3 2  0  PHQ - 2 Score 2 2 5 2  0  Altered sleeping 1 - 3 2 -  Tired, decreased energy 1 - 2 3 -  Change in appetite 1 - 2 0 -  Feeling bad or failure about yourself  1 - 3 0 -  Trouble concentrating 1 - 3 2 -  Moving slowly or fidgety/restless 0 - 1 0 -  Suicidal thoughts 0 - 0 0 -  PHQ-9 Score 7 - 19 9 -  Difficult doing work/chores Somewhat difficult - - Somewhat difficult -    No LMP recorded. (Menstrual status: IUD). The current method of family planning is IUD.  Last pap 12/01/17. Results were:  normal Review of Systems:   Pertinent items are noted in HPI Denies fever/chills, dizziness, headaches, visual disturbances, fatigue, shortness of breath, chest pain, abdominal pain, vomiting, abnormal vaginal discharge/itching/odor/irritation, problems with periods, bowel movements, urination, or intercourse unless otherwise stated above.  Pertinent History Reviewed:  Reviewed past medical,surgical, social, obstetrical and family history.  Reviewed problem list, medications and allergies. Physical Assessment:   Vitals:   05/18/20 1435  BP: (!) 145/86  Pulse: 80  Weight: 237 lb (107.5 kg)  Height: 5\' 5"  (1.651 m)  Body mass index is 39.44 kg/m.       Physical Examination:   General appearance: alert, well appearing, and in no distress  Mental status: alert, oriented to person, place, and time  Skin: warm & dry   Cardiovascular: normal heart rate noted  Respiratory: normal respiratory  effort, no distress  Abdomen: soft, non-tender   Pelvic: VULVA: normal appearing vulva with no masses, tenderness or lesions, VAGINA: normal appearing vagina with normal color and discharge, no lesions, CERVIX: normal appearing cervix without discharge or lesions, IUD strings visible and appropriate length  Extremities: no edema   Chaperone: Levy Pupa    No results found for this or any previous visit (from the past 24 hour(s)).  Assessment & Plan:  1) IUD check> in place  Meds: No orders of the defined types were placed in this encounter.   No orders of the defined types were placed in this encounter.   Return in about 1 year (around 05/18/2021) for Pap & physical.  Roma Schanz CNM, WHNP-BC 05/18/2020 3:00 PM

## 2020-05-20 ENCOUNTER — Telehealth (HOSPITAL_COMMUNITY): Payer: Self-pay | Admitting: Physical Therapy

## 2020-05-20 ENCOUNTER — Emergency Department (HOSPITAL_COMMUNITY)
Admission: EM | Admit: 2020-05-20 | Discharge: 2020-05-20 | Disposition: A | Discharge status: Home / Self Care | Payer: Medicaid Other | Attending: Emergency Medicine | Admitting: Emergency Medicine

## 2020-05-20 ENCOUNTER — Emergency Department (HOSPITAL_COMMUNITY): Payer: Medicaid Other

## 2020-05-20 ENCOUNTER — Ambulatory Visit (HOSPITAL_COMMUNITY): Payer: Medicaid Other | Admitting: Physical Therapy

## 2020-05-20 ENCOUNTER — Other Ambulatory Visit: Payer: Self-pay

## 2020-05-20 DIAGNOSIS — Z79899 Other long term (current) drug therapy: Secondary | ICD-10-CM | POA: Diagnosis not present

## 2020-05-20 DIAGNOSIS — M545 Low back pain, unspecified: Secondary | ICD-10-CM | POA: Insufficient documentation

## 2020-05-20 DIAGNOSIS — M5440 Lumbago with sciatica, unspecified side: Secondary | ICD-10-CM | POA: Diagnosis not present

## 2020-05-20 DIAGNOSIS — Y909 Presence of alcohol in blood, level not specified: Secondary | ICD-10-CM | POA: Insufficient documentation

## 2020-05-20 DIAGNOSIS — I10 Essential (primary) hypertension: Secondary | ICD-10-CM | POA: Insufficient documentation

## 2020-05-20 DIAGNOSIS — Z7982 Long term (current) use of aspirin: Secondary | ICD-10-CM | POA: Insufficient documentation

## 2020-05-20 DIAGNOSIS — R202 Paresthesia of skin: Secondary | ICD-10-CM | POA: Diagnosis not present

## 2020-05-20 DIAGNOSIS — R200 Anesthesia of skin: Secondary | ICD-10-CM | POA: Diagnosis not present

## 2020-05-20 DIAGNOSIS — I639 Cerebral infarction, unspecified: Secondary | ICD-10-CM | POA: Diagnosis not present

## 2020-05-20 DIAGNOSIS — Z20822 Contact with and (suspected) exposure to covid-19: Secondary | ICD-10-CM | POA: Diagnosis not present

## 2020-05-20 DIAGNOSIS — R519 Headache, unspecified: Secondary | ICD-10-CM | POA: Diagnosis not present

## 2020-05-20 DIAGNOSIS — R29818 Other symptoms and signs involving the nervous system: Secondary | ICD-10-CM | POA: Diagnosis not present

## 2020-05-20 LAB — COMPREHENSIVE METABOLIC PANEL
ALT: 22 U/L (ref 0–44)
AST: 20 U/L (ref 15–41)
Albumin: 3.6 g/dL (ref 3.5–5.0)
Alkaline Phosphatase: 52 U/L (ref 38–126)
Anion gap: 9 (ref 5–15)
BUN: 14 mg/dL (ref 6–20)
CO2: 22 mmol/L (ref 22–32)
Calcium: 8.8 mg/dL — ABNORMAL LOW (ref 8.9–10.3)
Chloride: 106 mmol/L (ref 98–111)
Creatinine, Ser: 0.69 mg/dL (ref 0.44–1.00)
GFR, Estimated: >60 mL/min (ref >60)
Glucose, Bld: 87 mg/dL (ref 70–99)
Potassium: 3.7 mmol/L (ref 3.5–5.1)
Sodium: 137 mmol/L (ref 135–145)
Total Bilirubin: 0.5 mg/dL (ref 0.3–1.2)
Total Protein: 7 g/dL (ref 6.5–8.1)

## 2020-05-20 LAB — CBC
HCT: 41.5 % (ref 36.0–46.0)
Hemoglobin: 13.1 g/dL (ref 12.0–15.0)
MCH: 26.4 pg (ref 26.0–34.0)
MCHC: 31.6 g/dL (ref 30.0–36.0)
MCV: 83.7 fL (ref 80.0–100.0)
Platelets: 256 10*3/uL (ref 150–400)
RBC: 4.96 MIL/uL (ref 3.87–5.11)
RDW: 15.1 % (ref 11.5–15.5)
WBC: 10.1 10*3/uL (ref 4.0–10.5)
nRBC: 0 % (ref 0.0–0.2)

## 2020-05-20 LAB — I-STAT CHEM 8, ED
BUN: 14 mg/dL (ref 6–20)
Calcium, Ion: 1.21 mmol/L (ref 1.15–1.40)
Chloride: 104 mmol/L (ref 98–111)
Creatinine, Ser: 0.7 mg/dL (ref 0.44–1.00)
Glucose, Bld: 88 mg/dL (ref 70–99)
HCT: 41 % (ref 36.0–46.0)
Hemoglobin: 13.9 g/dL (ref 12.0–15.0)
Potassium: 3.8 mmol/L (ref 3.5–5.1)
Sodium: 141 mmol/L (ref 135–145)
TCO2: 24 mmol/L (ref 22–32)

## 2020-05-20 LAB — ETHANOL: Alcohol, Ethyl (B): <10 mg/dL (ref <10)

## 2020-05-20 LAB — DIFFERENTIAL
Abs Immature Granulocytes: 0.04 10*3/uL (ref 0.00–0.07)
Basophils Absolute: 0 10*3/uL (ref 0.0–0.1)
Basophils Relative: 0 %
Eosinophils Absolute: 0.1 10*3/uL (ref 0.0–0.5)
Eosinophils Relative: 1 %
Immature Granulocytes: 0 %
Lymphocytes Relative: 23 %
Lymphs Abs: 2.3 10*3/uL (ref 0.7–4.0)
Monocytes Absolute: 0.5 10*3/uL (ref 0.1–1.0)
Monocytes Relative: 5 %
Neutro Abs: 7.2 10*3/uL (ref 1.7–7.7)
Neutrophils Relative %: 71 %

## 2020-05-20 LAB — RESP PANEL BY RT-PCR (FLU A&B, COVID) ARPGX2
Influenza A by PCR: NEGATIVE
Influenza B by PCR: NEGATIVE
SARS Coronavirus 2 by RT PCR: NEGATIVE

## 2020-05-20 LAB — CBG MONITORING, ED: Glucose-Capillary: 75 mg/dL (ref 70–99)

## 2020-05-20 LAB — PROTIME-INR
INR: 1 (ref 0.8–1.2)
Prothrombin Time: 13.1 seconds (ref 11.4–15.2)

## 2020-05-20 LAB — APTT: aPTT: 31 seconds (ref 24–36)

## 2020-05-20 IMAGING — MR MR HEAD W/O CM
12 of 13 series · 41 of 48 positions shown · non-contrast
Comparison: CT angiogram head/neck [DATE]. Noncontrast head CT
[DATE]. MRI/MRA head [DATE].

CLINICAL DATA: Neuro deficit, acute, stroke suspected. Additional
history provided: Numbness in left leg, back pain, headache. Prior
stroke in [REDACTED].

EXAM:
MRI HEAD WITHOUT CONTRAST
TECHNIQUE: Multiplanar, multiecho pulse sequences of the brain and surrounding
structures were obtained without intravenous contrast.

[Series 5: DWI · axial · 4.0mm · 0.88mm/px · z∈[-71,+66]mm · 5 of 36 slices shown (1 of 6)]
[im 1/36]
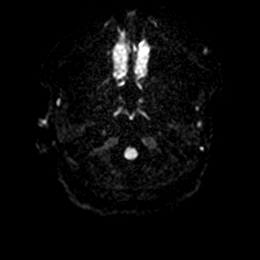
[im 9/36]
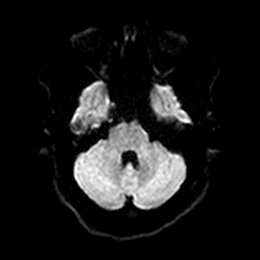
[im 18/36]
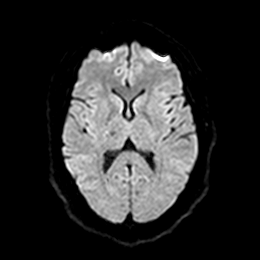
[im 27/36]
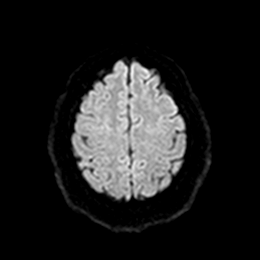
[im 36/36]
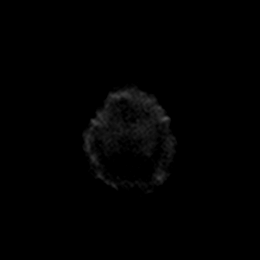

[Series 5: DWI · axial · 4.0mm · 0.88mm/px · z∈[-71,+66]mm · 5 of 36 slices shown (2 of 6)]
[im 1/36]
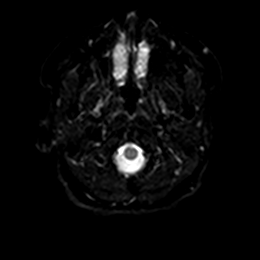
[im 9/36]
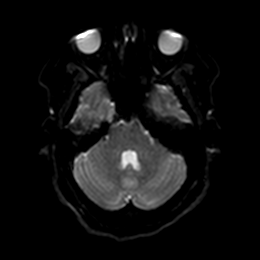
[im 18/36]
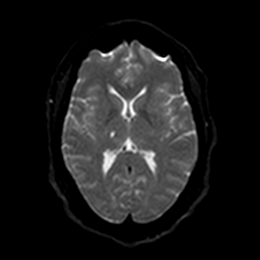
[im 27/36]
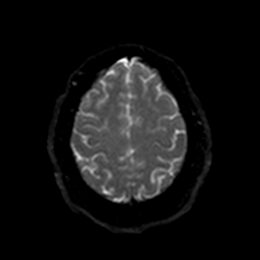
[im 36/36]
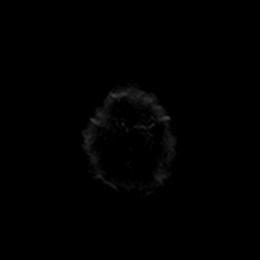

[Series 6: DWI · axial · 4.0mm · 0.88mm/px · z∈[-71,+66]mm · 5 of 36 slices shown (3 of 6)]
[im 1/36]
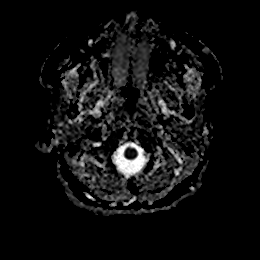
[im 9/36]
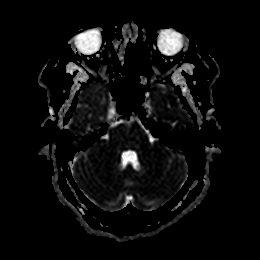
[im 18/36]
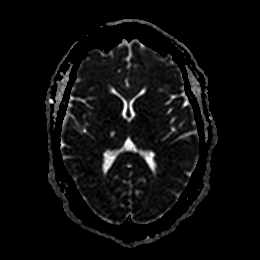
[im 27/36]
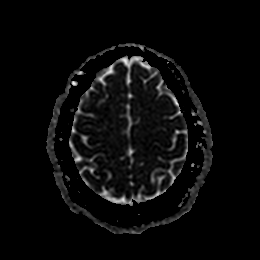
[im 36/36]
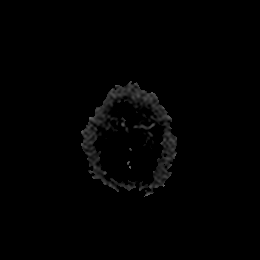

[Series 7: DWI · coronal · 5.0mm · 0.88mm/px · 4 of 28 slices shown (4 of 6)]
[im 1/28]
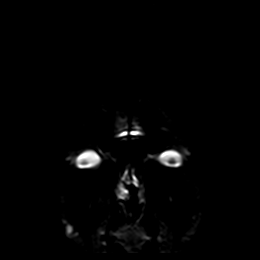
[im 10/28]
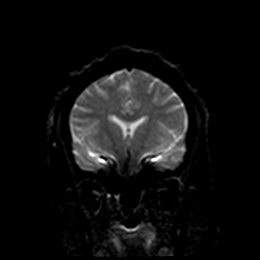
[im 19/28]
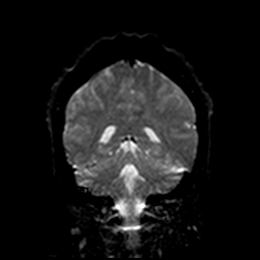
[im 28/28]
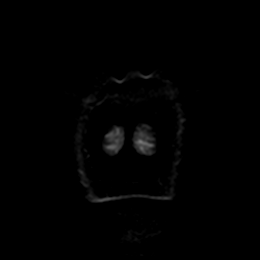

[Series 7: DWI · coronal · 5.0mm · 0.88mm/px · 3 of 28 slices shown (5 of 6)]
[im 1/28]
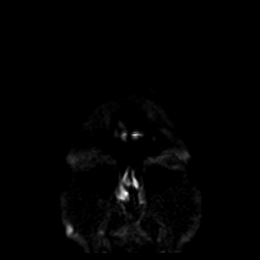
[im 14/28]
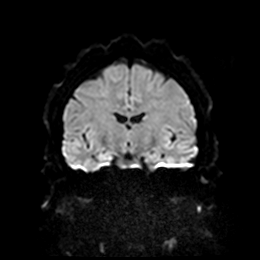
[im 28/28]
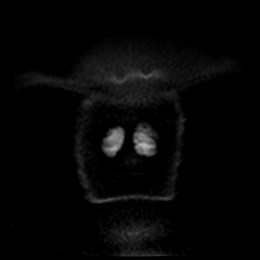

[Series 8: DWI · coronal · 5.0mm · 0.88mm/px · 3 of 28 slices shown (6 of 6)]
[im 1/28]
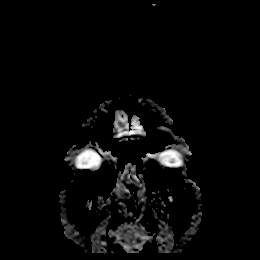
[im 14/28]
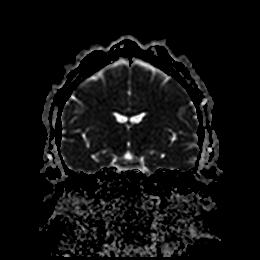
[im 28/28]
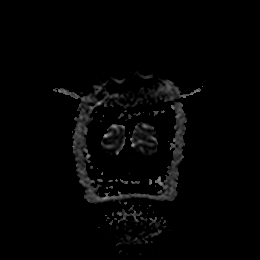

[Series 9: T1 · sagittal · 5.0mm · 0.94mm/px · 2 of 18 slices shown]
[im 1/18]
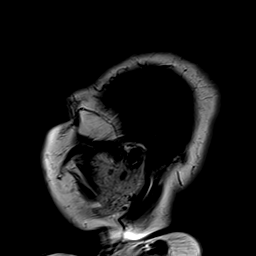
[im 18/18]
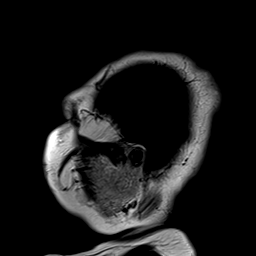

[Series 10: T2 · axial · 5.0mm · 0.72mm/px · z∈[-68,+63]mm · 2 of 20 slices shown (1 of 2)]
[im 1/20]
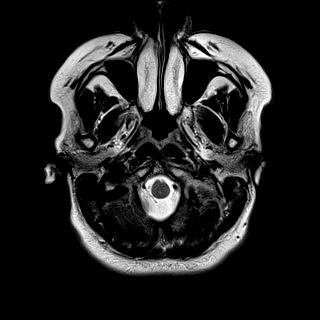
[im 20/20]
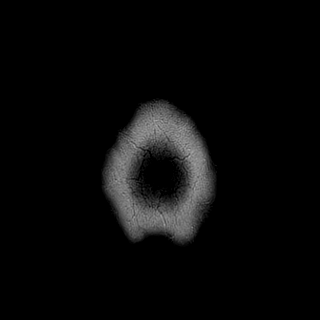

[Series 11: ax hemo · axial · 5.0mm · 0.86mm/px · z∈[-73,+69]mm · 3 of 25 slices shown]
[im 1/25]
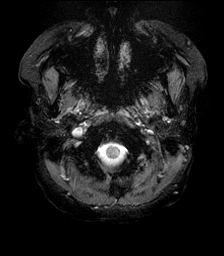
[im 13/25]
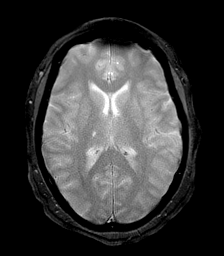
[im 25/25]
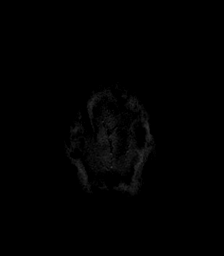

[Series 12: FLAIR · axial · 4.0mm · 0.43mm/px · z∈[-63,+59]mm · 4 of 32 slices shown (1 of 2)]
[im 1/32]
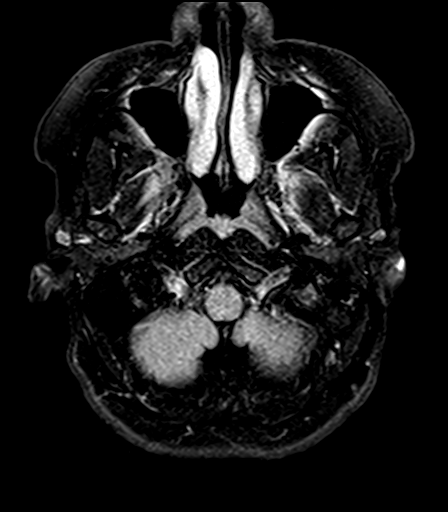
[im 11/32]
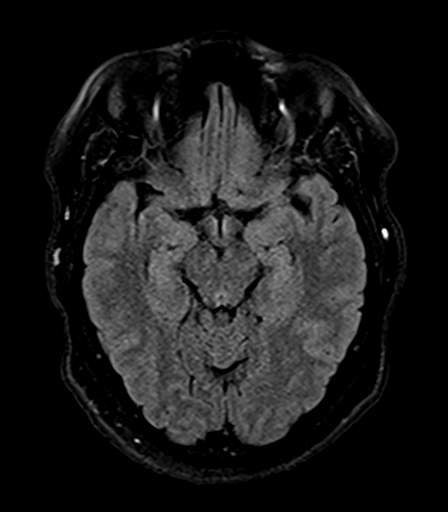
[im 21/32]
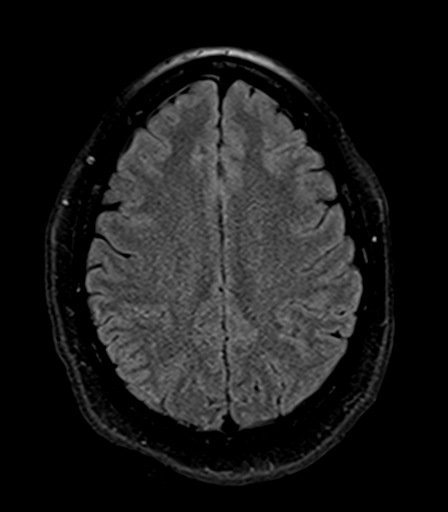
[im 32/32]
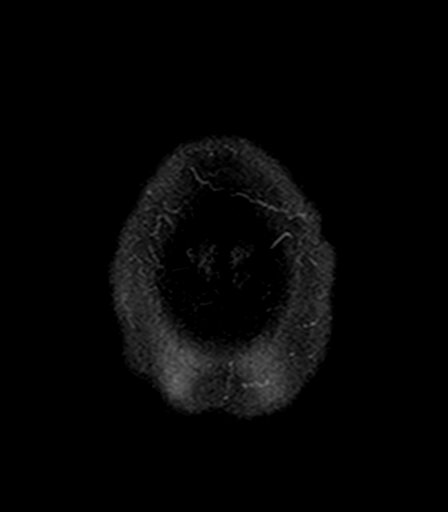

[Series 14: T2 · coronal · 5.0mm · 0.72mm/px · 3 of 26 slices shown (2 of 2)]
[im 1/26]
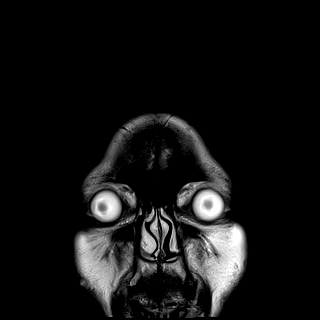
[im 13/26]
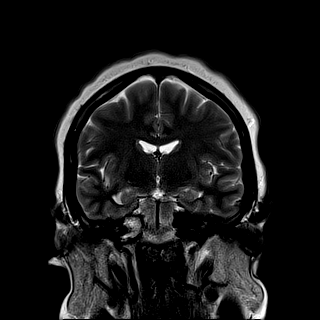
[im 26/26]
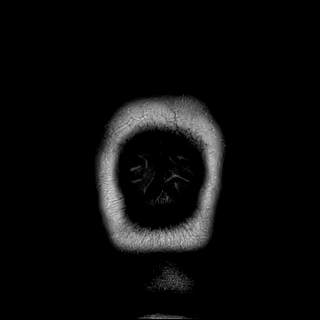

[Series 15: FLAIR · sagittal · 5.0mm · 0.94mm/px · 2 of 19 slices shown (2 of 2)]
[im 1/19]
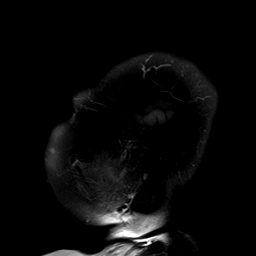
[im 19/19]
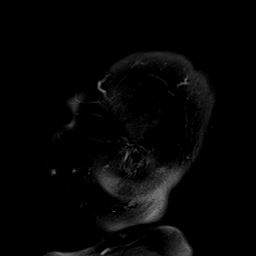

[41 of 48 positions shown; findings below may reference images not displayed]

FINDINGS: Brain:

Cerebral volume is normal for age.

Redemonstrated chronic infarct within the right thalamus.

Minimal multifocal T2/FLAIR hyperintensity within the cerebral white
matter is nonspecific, but compatible with chronic small vessel
ischemic disease.

There is no acute infarct.

No evidence of intracranial mass.

No chronic intracranial blood products.

No extra-axial fluid collection.

No midline shift.

Vascular: Expected proximal arterial flow voids.

Skull and upper cervical spine: No focal marrow lesion.

Sinuses/Orbits: Visualized orbits show no acute finding. Trace
ethmoid sinus mucosal thickening.

Other: Minimal fluid within the left mastoid air cells.
IMPRESSION: No evidence of acute intracranial abnormality, including acute
infarction.

Redemonstrated chronic right thalamic lacunar infarct.

Stable, minimal chronic small vessel ischemic disease within the
cerebral white matter.

Trace left mastoid effusion.

## 2020-05-20 IMAGING — CT CT ANGIO HEAD-NECK
2 of 8 series · 7 of 33 positions shown · IV contrast (omnipaque)
Comparison: Same day head CT.

CLINICAL DATA: Stroke follow-up.  Left-sided weakness.

EXAM:
CT ANGIOGRAPHY HEAD AND NECK
TECHNIQUE: Multidetector CT imaging of the head and neck was performed using
the standard protocol during bolus administration of intravenous
contrast. Multiplanar CT image reconstructions and MIPs were
obtained to evaluate the vascular anatomy. Carotid stenosis
measurements (when applicable) are obtained utilizing NASCET
criteria, using the distal internal carotid diameter as the
denominator.
CONTRAST:  75mL OMNIPAQUE IOHEXOL 350 MG/ML SOLN

[Series 6: cta head & neck · axial · 0.48mm/px · z∈[-177,+33]mm · 5 of 634 slices shown]
[im 106/634  soft-tissue]
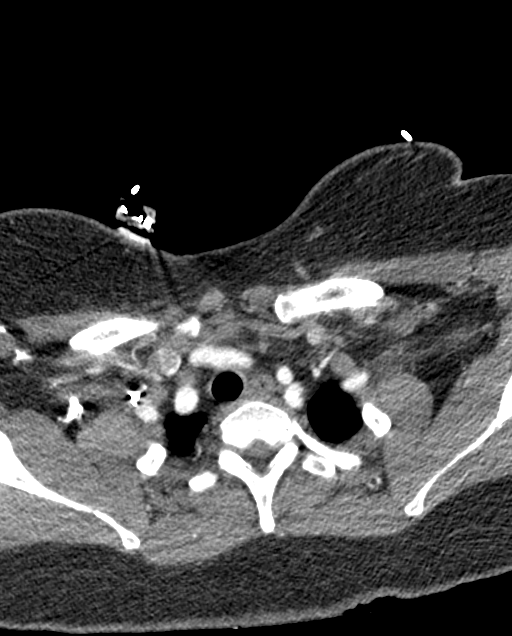
[im 212/634  bone]
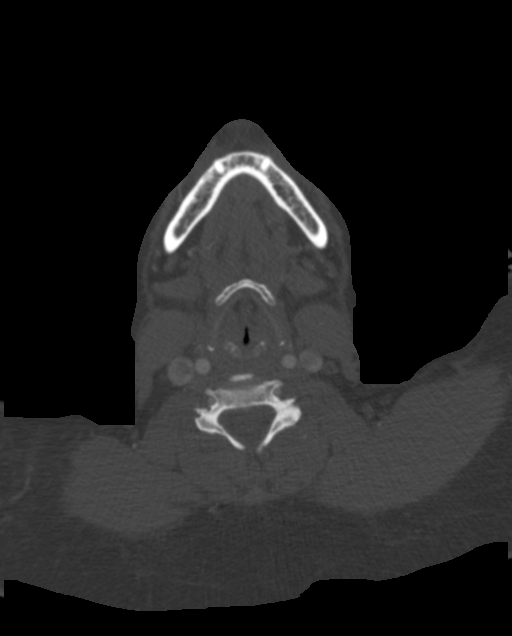
[im 317/634  soft-tissue]
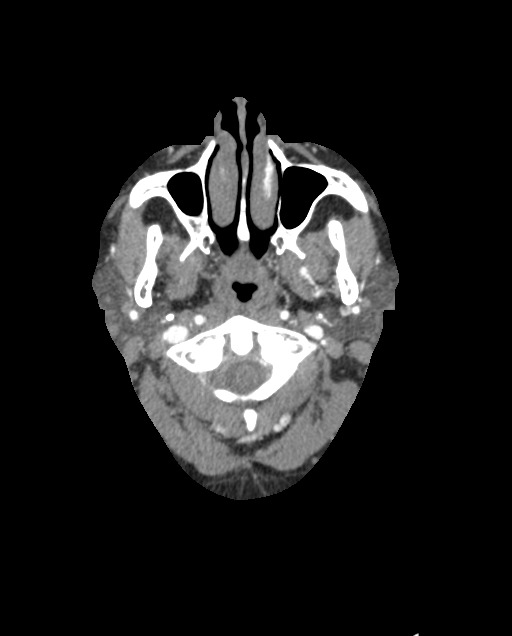
[im 423/634  bone]
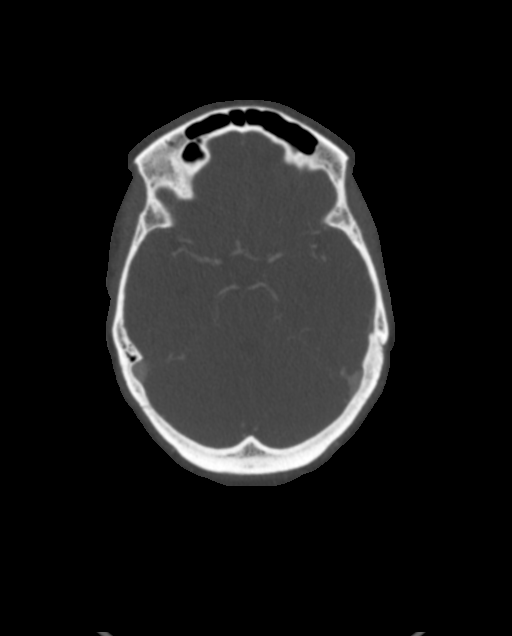
[im 528/634  soft-tissue]
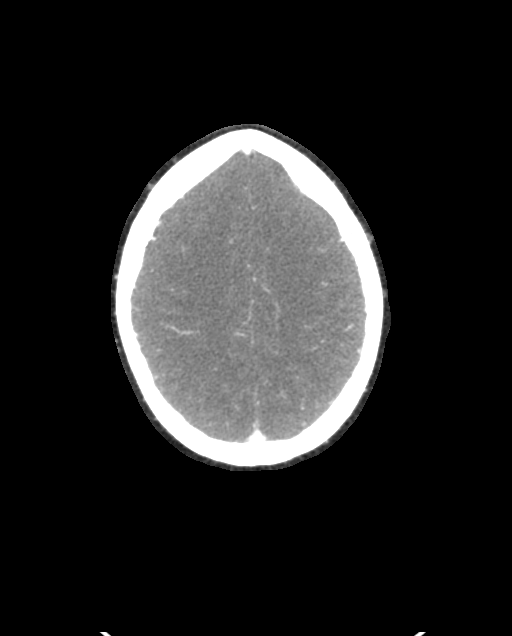

[Series 7: ax thins · axial · 0.50mm/px · z∈[-123,-16]mm · 2 of 322 slices shown]
[im 108/322  soft-tissue]
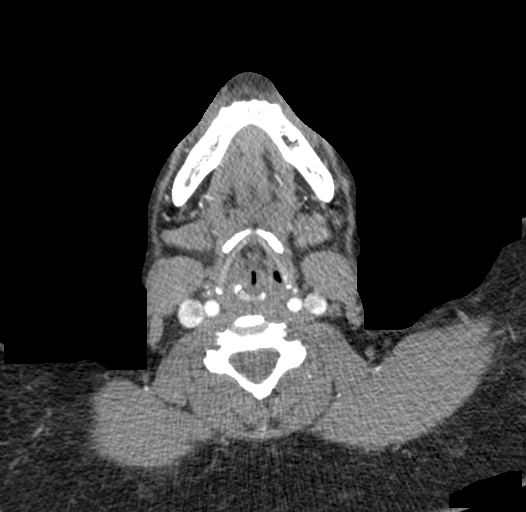
[im 215/322  soft-tissue]
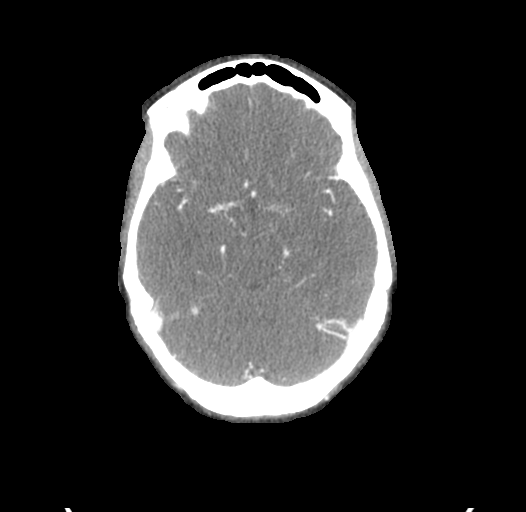

[7 of 33 positions shown; findings below may reference images not displayed]

FINDINGS: CTA NECK FINDINGS

Aortic arch: Great vessel origins are patent.

Right carotid system: No evidence of dissection, stenosis (50% or
greater) or occlusion.

Left carotid system: No evidence of dissection, stenosis (50% or
greater) or occlusion.

Vertebral arteries: Codominant. No evidence of dissection, stenosis
(50% or greater) or occlusion.

Skeleton: No acute findings.

Other neck: No mass or suspicious adenopathy.

Upper chest: Dependent atelectasis.  No consolidation.

Review of the MIP images confirms the above findings

CTA HEAD FINDINGS

Evaluation is limited by venous contamination. Within this
limitation:

Anterior circulation: Evaluation of the anterior circulation is
significantly limited by venous contamination. No large vessel
occlusion. Bilateral M1 MCAs are patent. Bilateral proximal M2 MCAs
are patent. Bilateral A1 and A2 ACAs are patent. No aneurysm
identified.

Posterior circulation: Bilateral intradural vertebral arteries and
the basilar artery are patent. Bilateral PCAs are patent without
evidence of hemodynamically significant proximal stenosis. No
aneurysm identified.

Venous sinuses: No evidence of dural sinus thrombosis.

Review of the MIP images confirms the above findings
IMPRESSION: 1. Intracranial evaluation is limited by venous contamination
without evidence of large vessel occlusion.
2. No hemodynamically significant stenosis in the neck.

## 2020-05-20 IMAGING — CT CT ANGIO HEAD-NECK
2 of 7 series · 8 of 33 positions shown · IV contrast (Omnipaque or Isovue)
Comparison: Same day head CT.

CLINICAL DATA: Stroke follow-up.  Left-sided weakness.

EXAM:
CT ANGIOGRAPHY HEAD AND NECK
TECHNIQUE: Multidetector CT imaging of the head and neck was performed using
the standard protocol during bolus administration of intravenous
contrast. Multiplanar CT image reconstructions and MIPs were
obtained to evaluate the vascular anatomy. Carotid stenosis
measurements (when applicable) are obtained utilizing NASCET
criteria, using the distal internal carotid diameter as the
denominator.
CONTRAST:  75mL OMNIPAQUE IOHEXOL 350 MG/ML SOLN

[Series 5: cta head & neck · axial · 0.48mm/px · z∈[-119,-11]mm · 2 of 163 slices shown]
[im 55/163  soft-tissue]
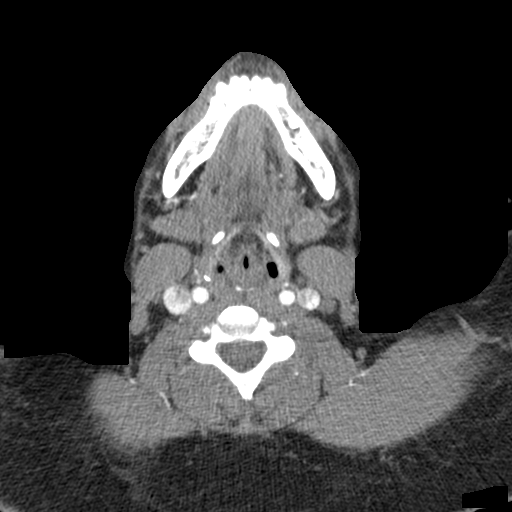
[im 109/163  soft-tissue]
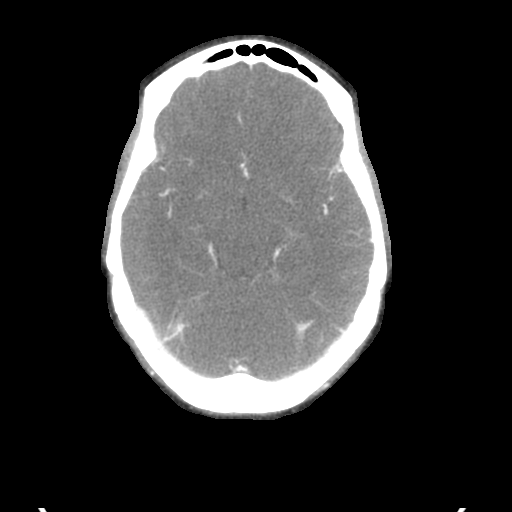

[Series 7: ax thins · axial · 0.50mm/px · z∈[-185,+45]mm · 6 of 322 slices shown]
[im 46/322  soft-tissue]
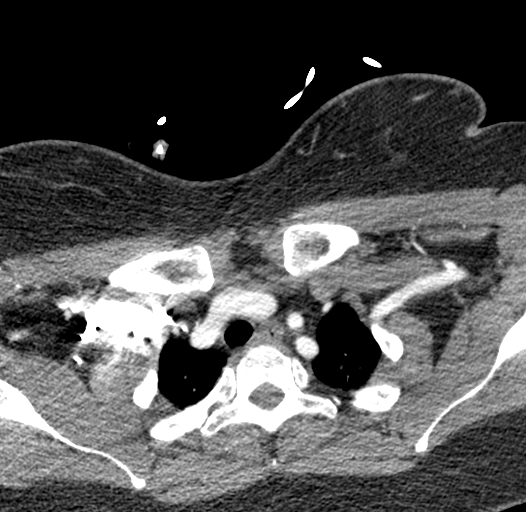
[im 92/322  bone]
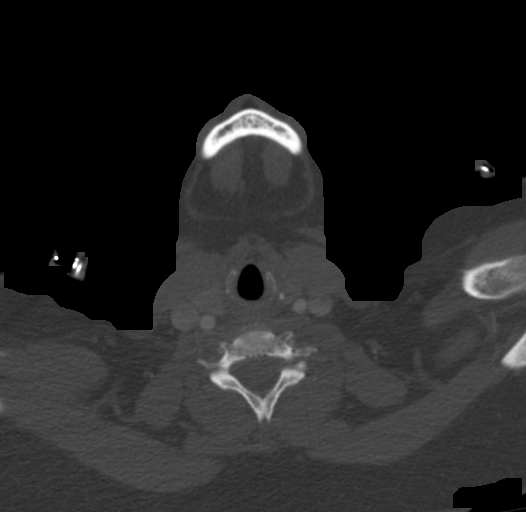
[im 138/322  soft-tissue]
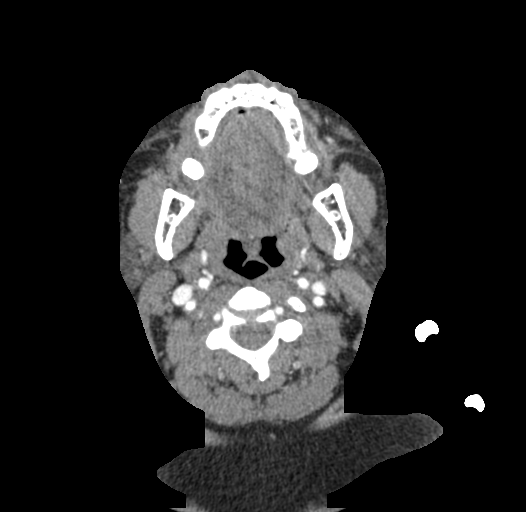
[im 184/322  bone]
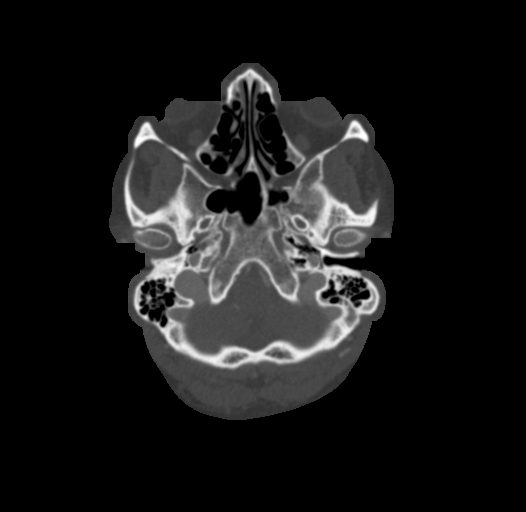
[im 230/322  soft-tissue]
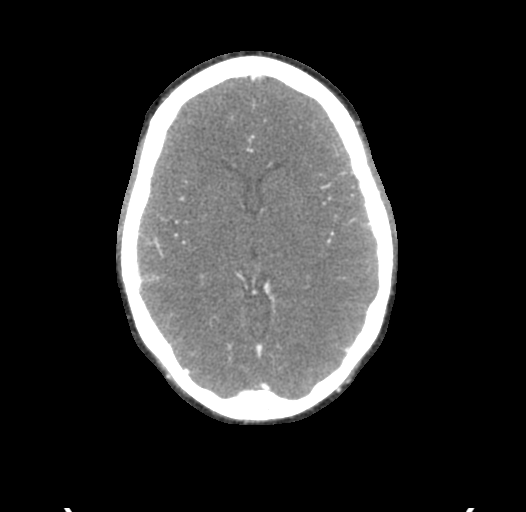
[im 276/322  bone]
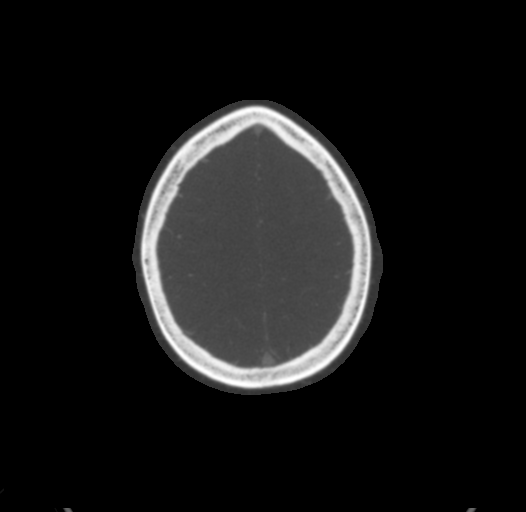

[8 of 33 positions shown; findings below may reference images not displayed]

FINDINGS: CTA NECK FINDINGS

Aortic arch: Great vessel origins are patent.

Right carotid system: No evidence of dissection, stenosis (50% or
greater) or occlusion.

Left carotid system: No evidence of dissection, stenosis (50% or
greater) or occlusion.

Vertebral arteries: Codominant. No evidence of dissection, stenosis
(50% or greater) or occlusion.

Skeleton: No acute findings.

Other neck: No mass or suspicious adenopathy.

Upper chest: Dependent atelectasis.  No consolidation.

Review of the MIP images confirms the above findings

CTA HEAD FINDINGS

Evaluation is limited by venous contamination. Within this
limitation:

Anterior circulation: Evaluation of the anterior circulation is
significantly limited by venous contamination. No large vessel
occlusion. Bilateral M1 MCAs are patent. Bilateral proximal M2 MCAs
are patent. Bilateral A1 and A2 ACAs are patent. No aneurysm
identified.

Posterior circulation: Bilateral intradural vertebral arteries and
the basilar artery are patent. Bilateral PCAs are patent without
evidence of hemodynamically significant proximal stenosis. No
aneurysm identified.

Venous sinuses: No evidence of dural sinus thrombosis.

Review of the MIP images confirms the above findings
IMPRESSION: 1. Intracranial evaluation is limited by venous contamination
without evidence of large vessel occlusion.
2. No hemodynamically significant stenosis in the neck.

## 2020-05-20 IMAGING — CT CT HEAD CODE STROKE
3 of 4 series · 14 of 47 positions shown, 16 images · non-contrast
Comparison: None.

CLINICAL DATA: Code stroke.  Neuro deficit, acute stroke suspected.

EXAM:
CT HEAD WITHOUT CONTRAST
TECHNIQUE: Contiguous axial images were obtained from the base of the skull
through the vertex without intravenous contrast.

[Series 4: coronal soft · coronal · 0.32mm/px · 3 of 71 slices shown]
[im 24/71  brain]
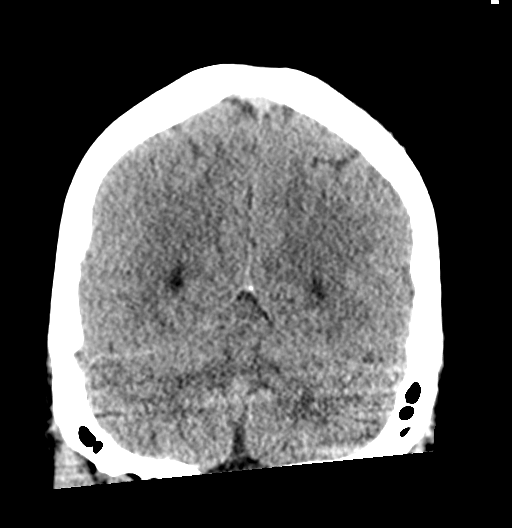
[im 32/71  brain]
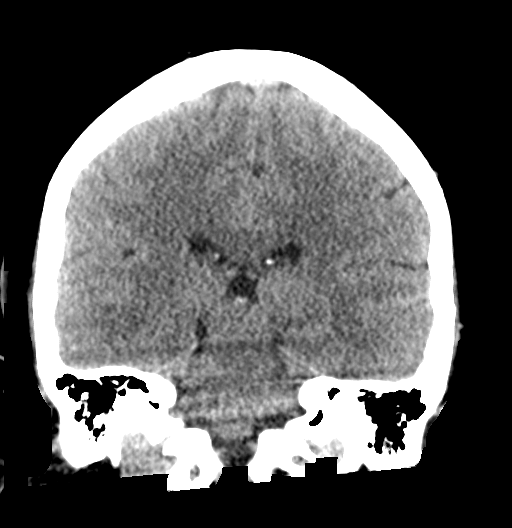
[im 39/71  brain]
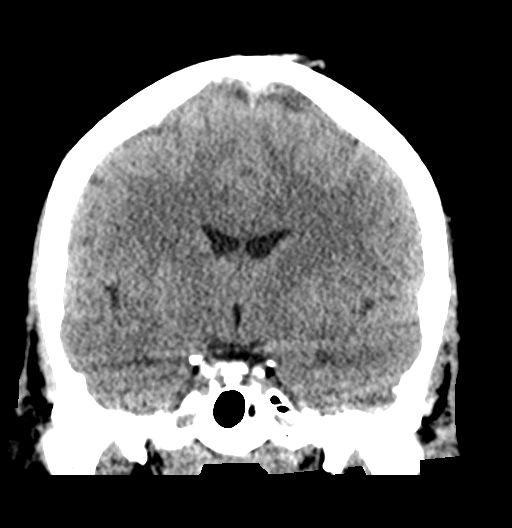

[Series 5: sagittal soft · sagittal · 0.33mm/px · 3 of 57 slices shown]
[im 19/57  brain]
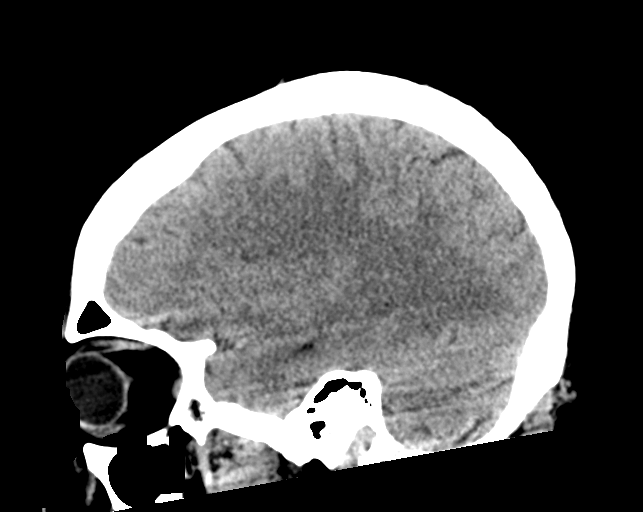
[im 29/57  brain]
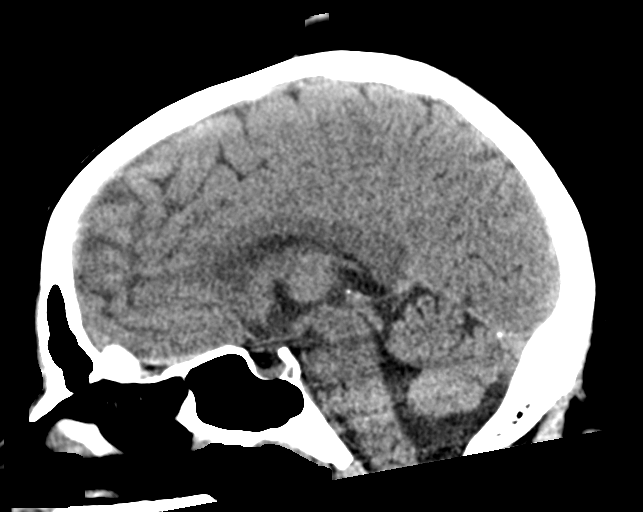
[im 38/57  brain]
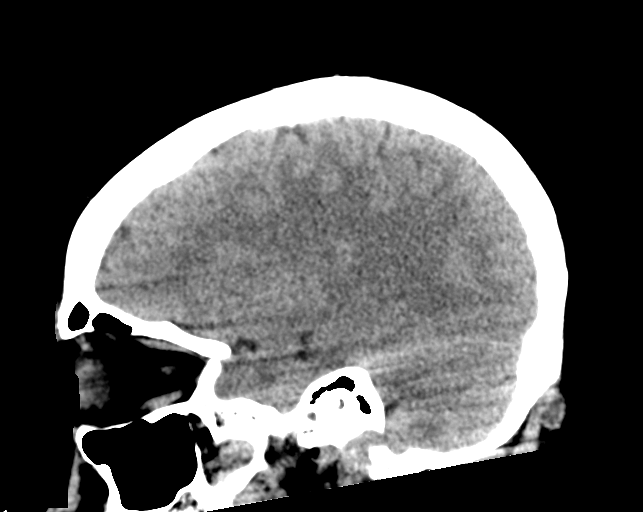

[Series 8: head ax w o · axial · 0.33mm/px · z∈[+10,+147]mm · 8 of 34 slices shown, 10 images]
[im 3/34  brain]
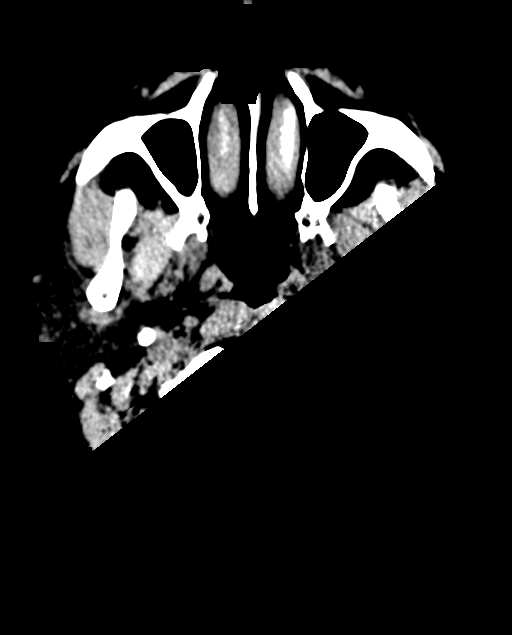
[im 3/34  bone]
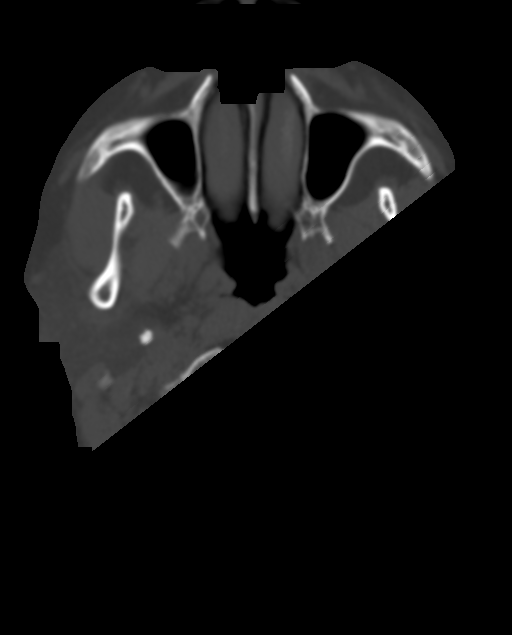
[im 7/34  brain]
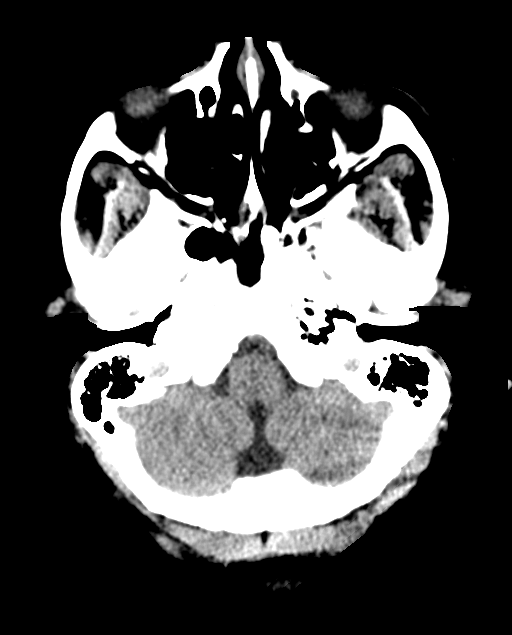
[im 12/34  brain]
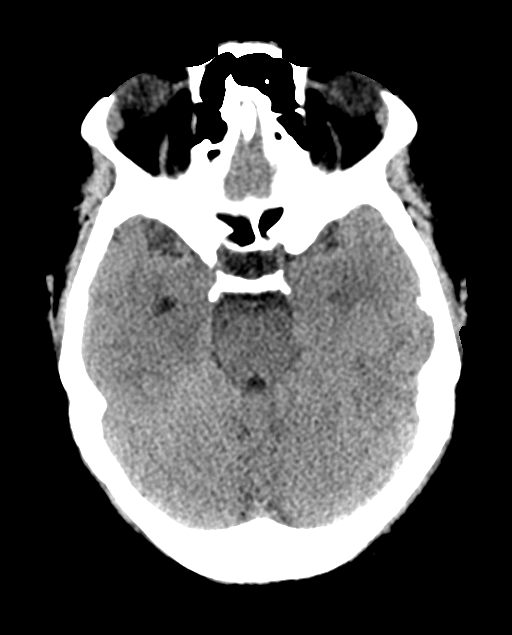
[im 16/34  brain]
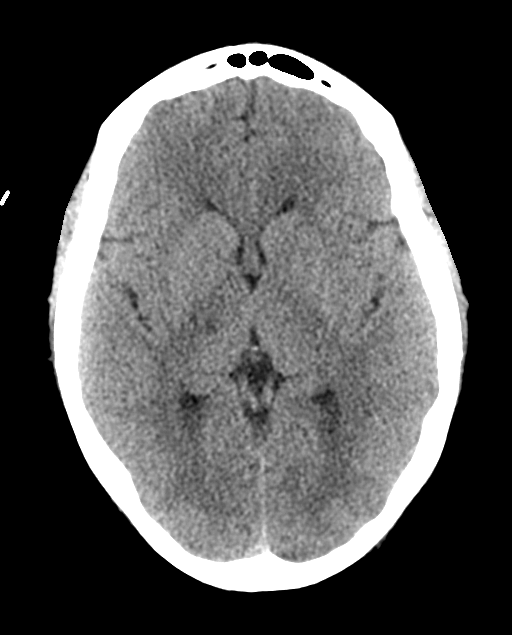
[im 18/34  brain]
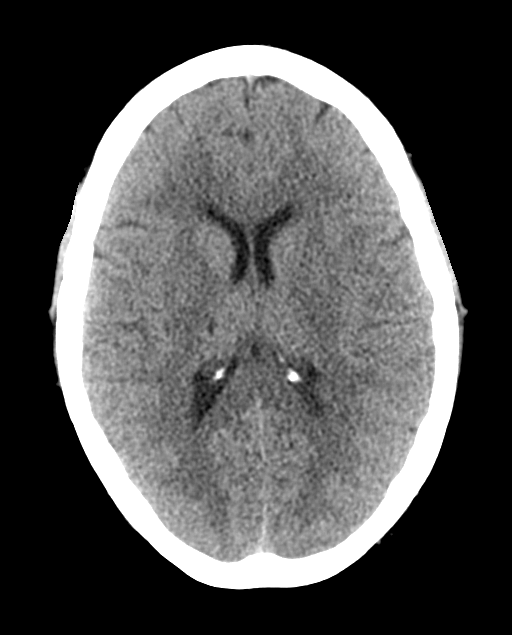
[im 18/34  bone]
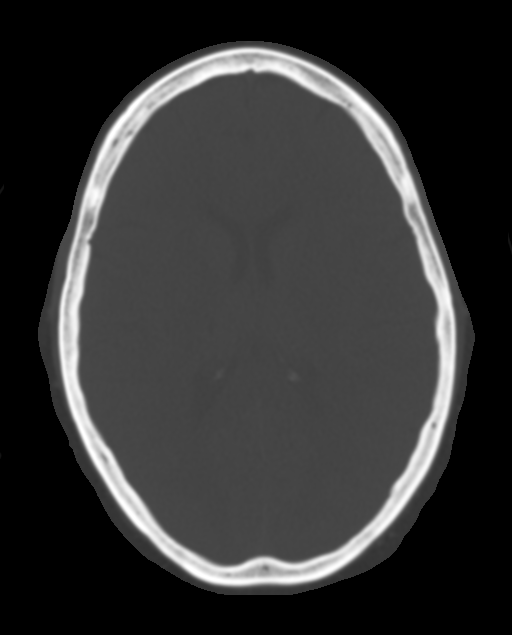
[im 23/34  brain]
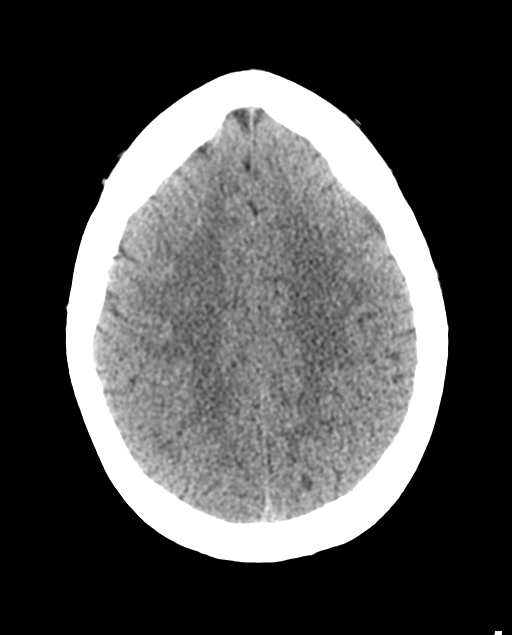
[im 27/34  brain]
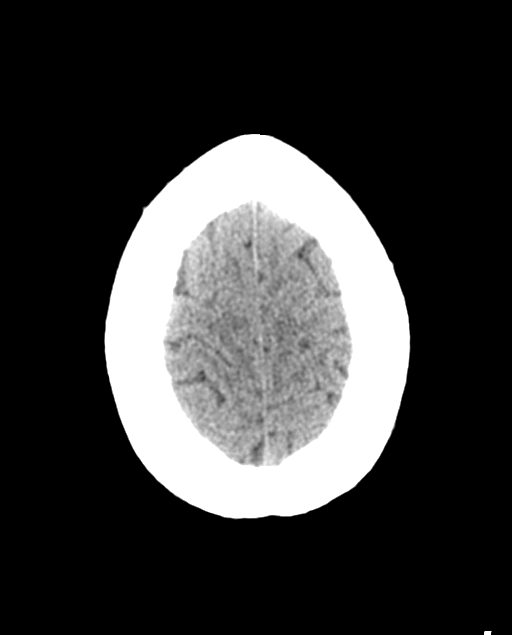
[im 31/34  brain]
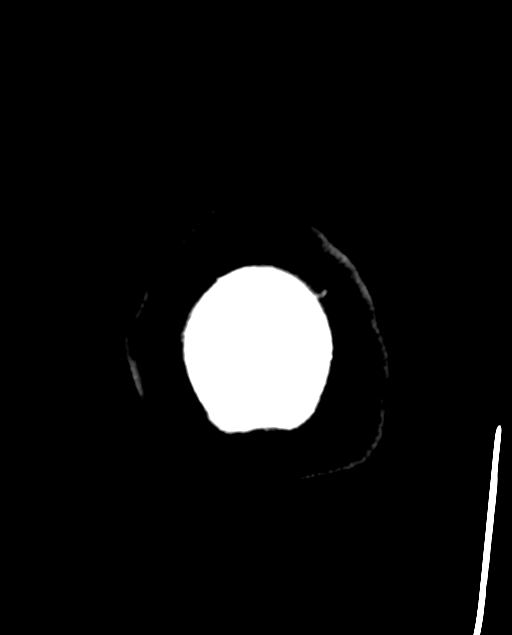

[14 of 47 positions shown; findings below may reference images not displayed]

FINDINGS: Brain: No evidence of acute large vascular territory infarction,
hemorrhage, hydrocephalus, extra-axial collection or mass
lesion/mass effect. Remote right thalamic infarct.

Vascular: No evidence of a hyperdense vessel.

Skull: No acute fracture.

Sinuses/Orbits: Sinuses are clear.  Unremarkable orbits.

Other: No mastoid effusions.

ASPECTS ([place] Stroke Program Early CT Score) total score (0-10
with 10 being normal): 10.
IMPRESSION: 1. No evidence of acute intracranial abnormality. ASPECTS is 10.
2. Remote right thalamic lacunar infarct.

Code stroke imaging results were communicated on [DATE] at [DATE] to provider Dr. CAMPANA via telephone, who verbally acknowledged
these results.

## 2020-05-20 MED ORDER — DIPHENHYDRAMINE HCL 50 MG/ML IJ SOLN
12.5000 mg | Freq: Once | INTRAMUSCULAR | Status: AC
  Administered 2020-05-20: 17:00:00 12.5 mg via INTRAVENOUS
  Filled 2020-05-20: qty 1

## 2020-05-20 MED ORDER — SODIUM CHLORIDE 0.9 % IV BOLUS
1000.0000 mL | Freq: Once | INTRAVENOUS | Status: AC
  Administered 2020-05-20: 17:00:00 1000 mL via INTRAVENOUS

## 2020-05-20 MED ORDER — ONDANSETRON HCL 4 MG/2ML IJ SOLN
4.0000 mg | Freq: Once | INTRAMUSCULAR | Status: AC
  Administered 2020-05-20: 15:00:00 4 mg via INTRAVENOUS
  Filled 2020-05-20: qty 2

## 2020-05-20 MED ORDER — PROCHLORPERAZINE EDISYLATE 10 MG/2ML IJ SOLN
5.0000 mg | Freq: Once | INTRAMUSCULAR | Status: DC
  Filled 2020-05-20: qty 2

## 2020-05-20 MED ORDER — FENTANYL CITRATE (PF) 100 MCG/2ML IJ SOLN
50.0000 ug | Freq: Once | INTRAMUSCULAR | Status: AC
  Administered 2020-05-20: 15:00:00 50 ug via INTRAVENOUS
  Filled 2020-05-20: qty 2

## 2020-05-20 MED ORDER — IOHEXOL 350 MG/ML SOLN
100.0000 mL | Freq: Once | INTRAVENOUS | Status: AC | PRN
  Administered 2020-05-20: 16:00:00 75 mL via INTRAVENOUS

## 2020-05-20 MED ORDER — PROCHLORPERAZINE EDISYLATE 10 MG/2ML IJ SOLN
5.0000 mg | Freq: Once | INTRAMUSCULAR | Status: AC
  Administered 2020-05-20: 17:00:00 5 mg via INTRAVENOUS

## 2020-05-20 MED ORDER — KETOROLAC TROMETHAMINE 30 MG/ML IJ SOLN
15.0000 mg | Freq: Once | INTRAMUSCULAR | Status: AC
  Administered 2020-05-20: 17:00:00 15 mg via INTRAVENOUS
  Filled 2020-05-20: qty 1

## 2020-05-20 NOTE — ED Notes (Signed)
Pt stable and does not require every 15 minutes at this time. Will continue to monitor.

## 2020-05-20 NOTE — ED Provider Notes (Signed)
[facility] EMERGENCY DEPARTMENT Provider Note   CSN: [id number] Arrival date & time: 05/20/20  1439  An emergency department physician performed an initial assessment on this suspected stroke patient at 22.  History Chief Complaint  Patient presents with  . Headache  . Numbness    Joyce [name] is a 44 y.o. 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  Patient has had 3 days of severe headache similar to previous migraines however unrelenting despite her medications.  3 hours prior to being seen by me at 11:45 AM her left leg became numbness suddenly worsened.  She also complains of severe low back pain worse on the left side.  She denies changes in vision, new or worsened burning weakness in the extremities.  The paresthesia of the lower extremity is generalized.  She complains of generalized throbbing headache.  She denies nausea or vomiting.  Patient was discharged from her previous admission for CVA on dual antiplatelet therapy and just discontinued her clopidogrel.  She is currently on aspirin.  HPI     Past Medical History:  Diagnosis Date  . Acute CVA (cerebrovascular accident) ([place]) 03/04/2020  . Acute CVA (cerebrovascular accident) ([place]) 03/04/2020  . Acute left-sided weakness 03/05/2020  . AMA (advanced maternal age) multigravida 35+ 11/12/2014  . Anemia   . Depression   . Koppel multiparity 11/26/2014  . Hypertension   . Migraine   . Pregnant 11/12/2014  . PTSD (post-traumatic stress disorder) 05/01/2017  . Round ligament pain 11/12/2014  . Short of breath on exertion 11/12/2014  . Trichomonas infection     Patient Active Problem List   Diagnosis Date Noted  . History of CVA with residual deficit 05/15/2020  . Neuropathic pain 05/06/2020  . Encounter for IUD insertion 04/20/2020  . Anxiety state   . Dyslipidemia   . Labile blood pressure    . Migraine without status migrainosus, not intractable   . Right thalamic infarction ([place]) 03/10/2020  . Left hemiparesis ([place]) 03/10/2020  . Recurrent falls 03/05/2020  . Leukocytosis 03/05/2020  . MDD (major depressive disorder), recurrent episode, moderate ([place]) 05/01/2017  . PTSD (post-traumatic stress disorder) 05/01/2017  . Depression, recurrent ([place]) 12/07/2016  . Essential hypertension 07/20/2016  . Uterine fibroid 01/27/2016  . Cervical high risk HPV (human papillomavirus) test positive 12/08/2014    Past Surgical History:  Procedure Laterality Date  . CHOLECYSTECTOMY       OB History    Gravida  11   Para  8   Term  8   Preterm  0   AB  3   Living  8     SAB  1   IAB  2   Ectopic      Multiple  0   Live Births  8           Family History  Problem Relation Age of Onset  . Diabetes Mother   . Hypertension Mother   . Kidney disease Mother   . Asthma Mother   . Hyperlipidemia Mother   . Alzheimer's disease Father   . Diabetes Father   . Kidney disease Sister   . Asthma Son   . Diabetes Sister   . Asthma Son   . ADD / ADHD Son   . Heart murmur Son   . Asthma Son     Social History   Tobacco Use  . Smoking status:  Never Smoker  . Smokeless tobacco: Never Used  Vaping Use  . Vaping Use: Never used  Substance Use Topics  . Alcohol use: Not Currently    Comment: occasionally  . Drug use: No    Home Medications Prior to Admission medications   Medication Sig Start Date End Date Taking? Authorizing Provider  ALPRAZolam (XANAX) 0.25 MG tablet Take 1 tablet (0.25 mg total) by mouth daily as needed for anxiety. 03/23/20  Yes Love, Ivan Anchors, PA-C  amLODipine (NORVASC) 5 MG tablet Take 1 tablet (5 mg total) by mouth daily. 03/23/20  Yes Love, Ivan Anchors, PA-C  aspirin 325 MG EC tablet Take 1 tablet (325 mg total) by mouth daily. 03/23/20  Yes Love, Ivan Anchors, PA-C  atorvastatin (LIPITOR) 80 MG tablet Take 1 tablet (80 mg total) by mouth  daily. 03/23/20  Yes Love, Ivan Anchors, PA-C  Baclofen 5 MG TABS Take 1 tablet by mouth 3 (three) times daily. 04/06/20  Yes [provider]  escitalopram (LEXAPRO) 20 MG tablet Take 1 tablet (20 mg total) by mouth daily. 03/23/20  Yes Love, Ivan Anchors, PA-C  gabapentin (NEURONTIN) 300 MG capsule Take 1 capsule (300 mg total) by mouth 4 (four) times daily. 1 cap twice daily and 2 caps at bedtime 05/06/20  Yes Meredith Staggers, MD  topiramate (TOPAMAX) 25 MG tablet Take 1 tablet (25 mg total) by mouth daily. 03/23/20  Yes Love, Ivan Anchors, PA-C  traZODone (DESYREL) 100 MG tablet Take 100 mg by mouth at bedtime. 03/31/20  Yes [provider]  clopidogrel (PLAVIX) 75 MG tablet Take 1 tablet (75 mg total) by mouth daily with breakfast. Patient not taking: No sig reported 03/23/20   Love, Ivan Anchors, PA-C  PARAGARD INTRAUTERINE COPPER IU by Intrauterine route.    [provider]    Allergies    Patient has no known allergies.  Review of Systems   Review of Systems Ten systems reviewed and are negative for acute change, except as noted in the HPI.   Physical Exam Updated Vital Signs BP 116/74   Pulse 71   Temp 98.5 F (36.9 C) (Oral)   Resp 16   SpO2 98%   Physical Exam Vitals and nursing note reviewed.  Constitutional:      General: She is not in acute distress.    Appearance: She is well-developed and well-nourished. She is not diaphoretic.  HENT:     Head: Normocephalic and atraumatic.     Mouth/Throat:     Mouth: Oropharynx is clear and moist.  Eyes:     General: No visual field deficit or scleral icterus.    Extraocular Movements: EOM normal.     Conjunctiva/sclera: Conjunctivae normal.     Pupils: Pupils are equal, round, and reactive to light.     Comments: No horizontal, vertical or rotational nystagmus  Neck:     Comments: Full active and passive ROM without pain No midline or paraspinal tenderness No nuchal rigidity or meningeal signs Cardiovascular:      Rate and Rhythm: Normal rate and regular rhythm.     Pulses: Intact distal pulses.  Pulmonary:     Effort: Pulmonary effort is normal. No respiratory distress.     Breath sounds: Normal breath sounds. No wheezing or rales.  Abdominal:     General: Bowel sounds are normal.     Palpations: Abdomen is soft.     Tenderness: There is no abdominal tenderness. There is no guarding or rebound.  Musculoskeletal:  General: Normal range of motion.     Cervical back: Normal range of motion and neck supple.  Lymphadenopathy:     Cervical: No cervical adenopathy.  Skin:    General: Skin is warm and dry.     Findings: No rash.  Neurological:     Mental Status: She is alert and oriented to person, place, and time.     GCS: GCS eye subscore is 4. GCS verbal subscore is 5. GCS motor subscore is 6.     Cranial Nerves: No cranial nerve deficit, dysarthria or facial asymmetry.     Sensory: Sensory deficit present.     Motor: Weakness present. No abnormal muscle tone.     Coordination: Coordination normal.     Deep Tendon Reflexes: Reflexes normal.  Psychiatric:        Mood and Affect: Mood is anxious.        Behavior: Behavior normal.        Thought Content: Thought content normal.        Judgment: Judgment normal.     ED Results / Procedures / Treatments   Labs (all labs ordered are listed, but only abnormal results are displayed) Labs Reviewed  COMPREHENSIVE METABOLIC PANEL - Abnormal; Notable for the following components:      Result Value   Calcium 8.8 (*)    All other components within normal limits  RESP PANEL BY RT-PCR (FLU A&B, COVID) ARPGX2  ETHANOL  PROTIME-INR  APTT  CBC  DIFFERENTIAL  RAPID URINE DRUG SCREEN, HOSP PERFORMED  URINALYSIS, ROUTINE W REFLEX MICROSCOPIC  CBG MONITORING, ED  I-STAT CHEM 8, ED  POC URINE PREG, ED    EKG EKG Interpretation  Date/Time:  Wednesday May 20 2020 15:14:33 EST Ventricular Rate:  83 PR Interval:    QRS  Duration: 89 QT Interval:  390 QTC Calculation: 459 R Axis:   58 Text Interpretation: Sinus rhythm Abnormal R-wave progression, early transition since last tracing no significant change Confirmed by Daleen Bo [phone]) on 05/20/2020 3:25:23 PM   Radiology MR BRAIN WO CONTRAST  Result Date: 05/20/2020 CLINICAL DATA:  Neuro deficit, acute, stroke suspected. Additional history provided: Numbness in left leg, back pain, headache. Prior stroke in October. EXAM: MRI HEAD WITHOUT CONTRAST TECHNIQUE: Multiplanar, multiecho pulse sequences of the brain and surrounding structures were obtained without intravenous contrast. COMPARISON:  CT angiogram head/neck 05/20/2020. Noncontrast head CT 05/20/2020. MRI/MRA head 03/05/2020. FINDINGS: Brain: Cerebral volume is normal for age. Redemonstrated chronic infarct within the right thalamus. Minimal multifocal T2/FLAIR hyperintensity within the cerebral white matter is nonspecific, but compatible with chronic small vessel ischemic disease. There is no acute infarct. No evidence of intracranial mass. No chronic intracranial blood products. No extra-axial fluid collection. No midline shift. Vascular: Expected proximal arterial flow voids. Skull and upper cervical spine: No focal marrow lesion. Sinuses/Orbits: Visualized orbits show no acute finding. Trace ethmoid sinus mucosal thickening. Other: Minimal fluid within the left mastoid air cells. IMPRESSION: No evidence of acute intracranial abnormality, including acute infarction. Redemonstrated chronic right thalamic lacunar infarct. Stable, minimal chronic small vessel ischemic disease within the cerebral white matter. Trace left mastoid effusion. Electronically Signed   By: Kellie Simmering DO   On: 05/20/2020 18:07   CT HEAD CODE STROKE WO CONTRAST  Result Date: 05/20/2020 CLINICAL DATA:  Code stroke.  Neuro deficit, acute stroke suspected. EXAM: CT HEAD WITHOUT CONTRAST TECHNIQUE: Contiguous axial images were obtained  from the base of the skull through the vertex without intravenous contrast.  COMPARISON:  None. FINDINGS: Brain: No evidence of acute large vascular territory infarction, hemorrhage, hydrocephalus, extra-axial collection or mass lesion/mass effect. Remote right thalamic infarct. Vascular: No evidence of a hyperdense vessel. Skull: No acute fracture. Sinuses/Orbits: Sinuses are clear.  Unremarkable orbits. Other: No mastoid effusions. ASPECTS [facility] Stroke Program Early CT Score) total score (0-10 with 10 being normal): 10. IMPRESSION: 1. No evidence of acute intracranial abnormality. ASPECTS is 10. 2. Remote right thalamic lacunar infarct. Code stroke imaging results were communicated on 05/20/2020 at 3:10 pm to provider Dr. Dewayne Hatch via telephone, who verbally acknowledged these results. Electronically Signed   By: Margaretha Sheffield MD   On: 05/20/2020 15:13   CT ANGIO HEAD CODE STROKE  Result Date: 05/20/2020 CLINICAL DATA:  Stroke follow-up.  Left-sided weakness. EXAM: CT ANGIOGRAPHY HEAD AND NECK TECHNIQUE: Multidetector CT imaging of the head and neck was performed using the standard protocol during bolus administration of intravenous contrast. Multiplanar CT image reconstructions and MIPs were obtained to evaluate the vascular anatomy. Carotid stenosis measurements (when applicable) are obtained utilizing NASCET criteria, using the distal internal carotid diameter as the denominator. CONTRAST:  75mL OMNIPAQUE IOHEXOL 350 MG/ML SOLN COMPARISON:  Same day head CT. FINDINGS: CTA NECK FINDINGS Aortic arch: Great vessel origins are patent. Right carotid system: No evidence of dissection, stenosis (50% or greater) or occlusion. Left carotid system: No evidence of dissection, stenosis (50% or greater) or occlusion. Vertebral arteries: Codominant. No evidence of dissection, stenosis (50% or greater) or occlusion. Skeleton: No acute findings. Other neck: No mass or suspicious adenopathy. Upper chest: Dependent  atelectasis.  No consolidation. Review of the MIP images confirms the above findings CTA HEAD FINDINGS Evaluation is limited by venous contamination. Within this limitation: Anterior circulation: Evaluation of the anterior circulation is significantly limited by venous contamination. No large vessel occlusion. Bilateral M1 MCAs are patent. Bilateral proximal M2 MCAs are patent. Bilateral A1 and A2 ACAs are patent. No aneurysm identified. Posterior circulation: Bilateral intradural vertebral arteries and the basilar artery are patent. Bilateral PCAs are patent without evidence of hemodynamically significant proximal stenosis. No aneurysm identified. Venous sinuses: No evidence of dural sinus thrombosis. Review of the MIP images confirms the above findings IMPRESSION: 1. Intracranial evaluation is limited by venous contamination without evidence of large vessel occlusion. 2. No hemodynamically significant stenosis in the neck. Electronically Signed   By: Margaretha Sheffield MD   On: 05/20/2020 16:28   CT ANGIO NECK CODE STROKE  Result Date: 05/20/2020 CLINICAL DATA:  Stroke follow-up.  Left-sided weakness. EXAM: CT ANGIOGRAPHY HEAD AND NECK TECHNIQUE: Multidetector CT imaging of the head and neck was performed using the standard protocol during bolus administration of intravenous contrast. Multiplanar CT image reconstructions and MIPs were obtained to evaluate the vascular anatomy. Carotid stenosis measurements (when applicable) are obtained utilizing NASCET criteria, using the distal internal carotid diameter as the denominator. CONTRAST:  87mL OMNIPAQUE IOHEXOL 350 MG/ML SOLN COMPARISON:  Same day head CT. FINDINGS: CTA NECK FINDINGS Aortic arch: Great vessel origins are patent. Right carotid system: No evidence of dissection, stenosis (50% or greater) or occlusion. Left carotid system: No evidence of dissection, stenosis (50% or greater) or occlusion. Vertebral arteries: Codominant. No evidence of dissection,  stenosis (50% or greater) or occlusion. Skeleton: No acute findings. Other neck: No mass or suspicious adenopathy. Upper chest: Dependent atelectasis.  No consolidation. Review of the MIP images confirms the above findings CTA HEAD FINDINGS Evaluation is limited by venous contamination. Within this limitation: Anterior circulation: Evaluation  of the anterior circulation is significantly limited by venous contamination. No large vessel occlusion. Bilateral M1 MCAs are patent. Bilateral proximal M2 MCAs are patent. Bilateral A1 and A2 ACAs are patent. No aneurysm identified. Posterior circulation: Bilateral intradural vertebral arteries and the basilar artery are patent. Bilateral PCAs are patent without evidence of hemodynamically significant proximal stenosis. No aneurysm identified. Venous sinuses: No evidence of dural sinus thrombosis. Review of the MIP images confirms the above findings IMPRESSION: 1. Intracranial evaluation is limited by venous contamination without evidence of large vessel occlusion. 2. No hemodynamically significant stenosis in the neck. Electronically Signed   By: Margaretha Sheffield MD   On: 05/20/2020 16:28    Procedures Procedures (including critical care time)  Medications Ordered in ED Medications  fentaNYL (SUBLIMAZE) injection 50 mcg (50 mcg Intravenous Given 05/20/20 1512)  ondansetron (ZOFRAN) injection 4 mg (4 mg Intravenous Given 05/20/20 1513)  iohexol (OMNIPAQUE) 350 MG/ML injection 100 mL (75 mLs Intravenous Contrast Given 05/20/20 1530)  sodium chloride 0.9 % bolus 1,000 mL (1,000 mLs Intravenous New Bag/Given 05/20/20 1652)  diphenhydrAMINE (BENADRYL) injection 12.5 mg (12.5 mg Intravenous Given 05/20/20 1652)  ketorolac (TORADOL) 30 MG/ML injection 15 mg (15 mg Intravenous Given 05/20/20 1653)  prochlorperazine (COMPAZINE) injection 5 mg (5 mg Intravenous Given 05/20/20 1654)    ED Course  I have reviewed the triage vital signs and the nursing  notes.  Pertinent labs & imaging results that were available during my care of the patient were reviewed by me and considered in my medical decision making (see chart for details).  Clinical Course as of 05/20/20 1910  Wed May 20, 2020  1520 Case discussed with Dr. Alexis Goodell of Neurology . The patient does not meet criteria for admin of TPA. Initial CT is negative, however we will proceed  with CTA head and neck and reevaluate. [AH]    Clinical Course User Index [AH] Margarita Mail, PA-C   MDM Rules/Calculators/A&P                          [id number]- secondary paresthesia and back pain VS:  Vitals:   05/20/20 1700 05/20/20 1755 05/20/20 1800 05/20/20 1830  BP: (!) 143/83 119/77 120/71 116/74  Pulse: 65 65 70 71  Resp: (!) 21 20 18 16   Temp:      TempSrc:      SpO2: 100% 98% 98% 98%    [id number] is gathered by patient  and emr. Previous records obtained and reviewed. DDX:The patient's complaint of headache involves an extensive number of diagnostic and treatment options, and is a complaint that carries with it a high risk of complications, morbidity, and potential mortality. Given the large differential diagnosis, medical decision making is of high complexity. Emergent considerations for headache include subarachnoid hemorrhage, meningitis, temporal arteritis, glaucoma, cerebral ischemia, carotid/vertebral dissection, intracranial tumor, Venous sinus thrombosis, carbon monoxide poisoning, acute or chronic subdural hemorrhage.  Other considerations include: Migraine, Cluster headache, Hypertension, Caffeine, alcohol, or drug withdrawal, Pseudotumor cerebri, Arteriovenous malformation, Head injury, Neurocysticercosis, Post-lumbar puncture, Preeclampsia, Tension headache, Sinusitis, Cervical arthritis, Refractive error causing strain, Dental abscess, Otitis media, Temporomandibular joint syndrome, Depression, Somatoform disorder (eg, somatization) Trigeminal neuralgia,  Glossopharyngeal neuralgia.  Labs: I ordered reviewed and interpreted labs which include CBC, CMP, PT/INR, APTT, ethanol, Chem-8 and urine pregnancy all of which are negative. Imaging: I ordered and reviewed images which included CT head, CT angiogram of the head and neck and MRI of the brain without  contrast. I independently visualized and interpreted all imaging. There are no acute, significant findings on today's images. EKG: Sinus rhythm at a rate of 83 Consults: Dr. Alexis Goodell who is on for teleneurology today. MDM: Patient here with cute on chronic paresthesia of the left lower extremity and headache with recent stroke.  Work-up today negative for stroke.  Pain is has improved significantly with treatment.  She is able to move both lower extremities with ease.  Headache is resolved.  Suspect complicated migraine.  She may have some radiculopathy from her low back versus paresthesia increased due to her increased respiratory rate and anxiety.  Does not appear to be an emergent cause of her symptoms should be discharged to follow-up with her primary care doctor and neurologist. Patient disposition:The patient appears reasonably screened and/or stabilized for discharge and I doubt any other medical condition or other [facility] requiring further screening, evaluation, or treatment in the ED at this time prior to discharge. I have discussed lab and/or imaging findings with the patient and answered all questions/concerns to the best of my ability.I have discussed return precautions and OP follow up.    Final Clinical Impression(s) / ED Diagnoses Final diagnoses:  Bad headache  Paresthesia  Acute left-sided low back pain, unspecified whether sciatica present    Rx / DC Orders ED Discharge Orders    None       Margarita Mail, PA-C 05/20/20 1913    Daleen Bo, MD 05/29/20 1728

## 2020-05-20 NOTE — Discharge Instructions (Signed)

## 2020-05-20 NOTE — Consult Note (Signed)
[facility] Telemedicine Consult   Requesting Provider: Eulis Foster Consult Participants: Nurse Location of the provider: [facility] Location of the patient: [name]  This consult was provided via telemedicine with 2-way video and audio communication. The patient/family was informed that care would be provided in this way and agreed to receive care in this manner.    Chief Complaint: Headache, LLE numbness and back apin  HPI: 44 year old female with a history of right thalamic infarct on 03/04/2020 with resultant left sided numbness and weakness who reports that on yesterday she began with a headache and experienced back pain as well.  Back pain has worsened today.  Also note this afternoon that her left leg was more numb than at baseline.  Patient presented for evaluation at that time.    LKW: 05/20/2020, 1200 tpa given?: No, Recent infarct IR Thrombectomy? No, No evidence of LVO Modified Rankin Scale: 2-Slight disability-UNABLE to perform all activities but does not need assistance Time of teleneurologist evaluation: 1455  Exam: Vitals:   05/20/20 1453 05/20/20 1454  BP:  136/78  Pulse:  89  Resp:  (!) 32  Temp: 98.5 F (36.9 C)   SpO2:  100%    General: Awake and alert complaining of significant back pain  1A: Level of Consciousness - 0 1B: Ask Month and Age - 1 1C: 'Blink Eyes' & 'Squeeze Hands' - 0 2: Test Horizontal Extraocular Movements - 0 3: Test Visual Fields - 0 4: Test Facial Palsy - 0 5A: Test Left Arm Motor Drift - 1 5B: Test Right Arm Motor Drift - 0 6A: Test Left Leg Motor Drift - 3 6B: Test Right Leg Motor Drift - 2 7: Test Limb Ataxia - 0 8: Test Sensation - 1 9: Test Language/Aphasia- 0 10: Test Dysarthria - 0 11: Test Extinction/Inattention - 0 NIHSS score: 8  * Although patient unable to lift the left leg off the bed when asked, when she performs heel-to-shin testing able to keep the leg raised while resting on the right shin.  When both  legs lifted actively the right leg falls to the bed before the left.    Imaging Reviewed:  Head CT without contrast personally reviewed and shows no acute changes.    CTA of head and neck: No evidence of LVO  Labs reviewed in epic and pertinent values follow: CBG 75   Assessment: 44 year old female with a history of right thalamic infarct on 03/04/2020 with resultant left sided numbness and weakness presenting with complaints of worsening LLE numbness, back pain and headache.  Back pain seems to be the most predominant complaint and appears to interfere with examination of the lower extremities.  Patient not a tPA or thrombectomy candidate.  Further work up recommended.    Recommendations:  1. Patient to continue current antiplatelet therapy 2. MRI of the brain and lumbar spine 3. Analgesia for back pain   This patient is receiving care for possible acute neurological changes. There was 45 minutes of care by this provider at the time of service, including time for direct evaluation via telemedicine, review of medical records, imaging studies and discussion of findings with providers, the patient and/or family.  Alexis Goodell, MD Neurology   If 7pm- 7am, please page neurology on call as listed in [place].

## 2020-05-20 NOTE — ED Triage Notes (Signed)
Pt to er room number 18, pt states that she had a stoke in October, states that it was a hemorraghic stroke, states that she is here for some numbness in her L leg, back pain and a headache.  Pt states that she has some numbness from her previous stroke, but the numbness is worse, MD notified of pt, PA at bedside for fast exam and to evaluate pt.  Pt states that the numbness had a sudden onset aprox 3 hours ago.  Last known normal was about three hours ago 1145am

## 2020-05-20 NOTE — Telephone Encounter (Signed)
pt cancel today's appt due to she has a headache

## 2020-06-03 ENCOUNTER — Other Ambulatory Visit: Payer: Self-pay

## 2020-06-03 ENCOUNTER — Ambulatory Visit (HOSPITAL_COMMUNITY): Payer: Medicaid Other | Attending: Physical Medicine & Rehabilitation | Admitting: Physical Therapy

## 2020-06-03 ENCOUNTER — Encounter (HOSPITAL_COMMUNITY): Payer: Self-pay | Admitting: Physical Therapy

## 2020-06-03 DIAGNOSIS — R262 Difficulty in walking, not elsewhere classified: Secondary | ICD-10-CM | POA: Insufficient documentation

## 2020-06-03 DIAGNOSIS — M6281 Muscle weakness (generalized): Secondary | ICD-10-CM | POA: Diagnosis not present

## 2020-06-03 DIAGNOSIS — R29818 Other symptoms and signs involving the nervous system: Secondary | ICD-10-CM | POA: Diagnosis not present

## 2020-06-03 DIAGNOSIS — R278 Other lack of coordination: Secondary | ICD-10-CM | POA: Insufficient documentation

## 2020-06-03 DIAGNOSIS — M25612 Stiffness of left shoulder, not elsewhere classified: Secondary | ICD-10-CM | POA: Diagnosis not present

## 2020-06-03 NOTE — Therapy (Signed)
[place], [place], [address] Phone: [phone]   Fax:  [phone]  Physical Therapy Treatment, Progress Note and RECERT  Patient Details  Name: Joyce [name] MRN: [id number] Date of Birth: August 04, 1975 Referring Provider (PT): Lauraine Rinne PA  Progress Note Reporting Period 04/01/20 to 06/03/20  See note below for Objective Data and Assessment of Progress/Goals.       Encounter Date: 06/03/2020   PT End of Session - 06/03/20 1313    Visit Number 9    Number of Visits 17    Date for PT Re-Evaluation 07/29/20    Authorization Type self -pay, [employer]-approved 12 visits from 11/30-12/30    Authorization - Visit Number 0    Authorization - Number of Visits 0    Progress Note Due on Visit 10    PT Start Time 1315    PT Stop Time 1355    PT Time Calculation (min) 40 min    Equipment Utilized During Treatment Gait belt    Activity Tolerance Patient limited by fatigue    Behavior During Therapy WFL for tasks assessed/performed           Past Medical History:  Diagnosis Date  . Acute CVA (cerebrovascular accident) ([place]) 03/04/2020  . Acute CVA (cerebrovascular accident) ([place]) 03/04/2020  . Acute left-sided weakness 03/05/2020  . AMA (advanced maternal age) multigravida 35+ 11/12/2014  . Anemia   . Depression   . [place] multiparity 11/26/2014  . Hypertension   . Migraine   . Pregnant 11/12/2014  . PTSD (post-traumatic stress disorder) 05/01/2017  . Round ligament pain 11/12/2014  . Short of breath on exertion 11/12/2014  . Trichomonas infection     Past Surgical History:  Procedure Laterality Date  . CHOLECYSTECTOMY      There were no vitals filed for this visit.   Subjective Assessment - 06/03/20 1323    Subjective States she is doing alright and reports a little more pain today in her left leg and fingers and is reported as 7/10. States since her last appointment she went to ED. States that  they didn't find anything significantly wrong but treated her back pain (pain medication). States that she feels in the morning things are worse and her hand is really tight. States that she feels like she is starting over. States that overall she feels about 40% better since the start of therapy. States that when she walks further she can't breath because she is so fatigued and tire. States she tries to walk around the house without her rollator. States that she has been very tired and she hasn't done her exercises like she should.    Pertinent History HTN, Migraine, PTSD, Acute CVA 03/04/20    How long can you walk comfortably? 5-10 with rollator    Currently in Pain? Yes    Pain Score 7     Pain Location Leg    Pain Orientation Left    Pain Descriptors / Indicators Aching;Tightness              [facility] PT Assessment - 06/03/20 1331      Assessment   Medical Diagnosis left hemiplegia    Referring Provider (PT) Lauraine Rinne PA    Next MD Visit 06/09/20      Strength   Right Hip Flexion 4-/5    Left Hip Flexion 3-/5    Right Knee Flexion 3+/5    Right Knee Extension 3+/5  Left Knee Flexion 3-/5    Left Knee Extension 3/5    Right Ankle Dorsiflexion 4+/5    Left Ankle Dorsiflexion 3-/5      Transfers   Transfers Sit to Stand;Stand to Sit    Five time sit to stand comments  --   unable to complete performed 2 reps with hands on thighs in 1:38, took 28 seconds to perform one rep, used left hand to press off of left thigh - no arm rails used     Ambulation/Gait   Ambulation/Gait Yes    Ambulation/Gait Assistance 5: Supervision    Ambulation Distance (Feet) 116 Feet    Assistive device Rolling walker    Ambulation Surface Level;Indoor    Gait velocity decreased    Gait Comments 2MW, increased labored breathing, legs shaking, left flat foot                                 PT Education - 06/03/20 1342    Education Details on current presentation,  limited progress, importance of HEP compliance and plan moving for forward    Person(s) Educated Patient    Methods Explanation    Comprehension Verbalized understanding            PT Short Term Goals - 06/03/20 1331      PT SHORT TERM GOAL #1   Title Patient will report at least 25% improvement in overall symptoms and/or function to demonstrate improved functional mobility    Time 4    Period Weeks    Status Achieved    Target Date 04/29/20      PT SHORT TERM GOAL #2   Title Patient will be independent in self management strategies to improve quality of life and functional outcomes.    Time 4    Period Weeks    Status On-going    Target Date 04/29/20      PT SHORT TERM GOAL #3   Title Patient will be able to transition from sit to stand without use of upper extremities to improve transitional mobility    Baseline left hand on thigh to perform, slow and labored movement    Time 4    Period Weeks    Status Partially Met    Target Date 04/29/20             PT Long Term Goals - 06/03/20 1331      PT LONG TERM GOAL #1   Title Patient will report at least 50% improvement in overall symptoms and/or function to demonstrate improved functional mobility    Time 8    Period Weeks    Status On-going      PT LONG TERM GOAL #2   Title Patient will be able to ascend and descend stairs with use of railing and step through gait pattern to improve ability to go up to bedroom    Baseline unable step through with heavy use of arms on the railing    Time 8    Period Weeks    Status On-going      PT LONG TERM GOAL #3   Title Patient will be able to ambulate at least 226 feet in 2 minutes without use of Assistive device to improve ability to walk in community    Time 8    Period Weeks    Status On-going  Plan - 06/03/20 1314    Clinical Impression Statement Patient present for a progress note on this date. Limited progress noted towards goals with only  subjective goal being met at this time. Increased labored movements noted on this date with walking and sit to stands. Despite visual difficulty with patient transitioning from sit to stand and stand to sit (increased labored breathing, legs shaking), patient able to demonstrate controlled eccentric lowering to chair. Discussed importance of compliance with HEP to anticipate improvements in overall function. Patient to follow up with MD next week, will follow up when patient returns. Extending POC additional 8 visits to continue to work on overall function and strength.    Personal Factors and Comorbidities Comorbidity 1;Comorbidity 2    Comorbidities migraines, HTN    Examination-Activity Limitations Locomotion Level;Lift;Transfers;Toileting;Stand;Stairs;Squat;Carry;Bathing    Examination-Participation Restrictions Cleaning;Community Activity;Meal Prep;Shop;Occupation    Stability/Clinical Decision Making Stable/Uncomplicated    Rehab Potential Good    PT Frequency --   1-2x/week for total of 8 visits over 8 week certification period   PT Duration 8 weeks    PT Treatment/Interventions ADLs/Self Care Home Management;Aquatic Therapy;Cryotherapy;Electrical Stimulation;Moist Heat;Traction;Balance training;Therapeutic exercise;Therapeutic activities;Functional mobility training;Stair training;Gait training;DME Instruction;Neuromuscular re-education;Patient/family education;Orthotic Fit/Training;Manual techniques;Energy conservation;Dry needling;Passive range of motion    PT Next Visit Plan LE strengthening.  focus on muscle activation, walking mechanics, transfers without using UE to mobilize Lt LE, standing exercises.  Progress with open chain PRE and dynamic standing activities    PT Home Exercise Plan LAQs, DF    Consulted and Agree with Plan of Care Patient           Patient will benefit from skilled therapeutic intervention in order to improve the following deficits and impairments:  Decreased  coordination,Improper body mechanics,Decreased range of motion,Decreased balance,Decreased mobility,Difficulty walking,Pain,Decreased strength,Decreased activity tolerance,Decreased knowledge of use of DME,Abnormal gait,Decreased endurance  Visit Diagnosis: Difficulty in walking, not elsewhere classified  Muscle weakness (generalized)     Problem List Patient Active Problem List   Diagnosis Date Noted  . History of CVA with residual deficit 05/15/2020  . Neuropathic pain 05/06/2020  . Encounter for IUD insertion 04/20/2020  . Anxiety state   . Dyslipidemia   . Labile blood pressure   . Migraine without status migrainosus, not intractable   . Right thalamic infarction ([place]) 03/10/2020  . Left hemiparesis ([place]) 03/10/2020  . Recurrent falls 03/05/2020  . Leukocytosis 03/05/2020  . MDD (major depressive disorder), recurrent episode, moderate ([place]) 05/01/2017  . PTSD (post-traumatic stress disorder) 05/01/2017  . Depression, recurrent ([place]) 12/07/2016  . Essential hypertension 07/20/2016  . Uterine fibroid 01/27/2016  . Cervical high risk HPV (human papillomavirus) test positive 12/08/2014    3:28 PM, 06/03/20 Jerene Pitch, DPT Physical Therapy with [facility]  [phone] office  [place] [address] [place], [place], [address] Phone: [phone]   Fax:  [phone]  Name: TANGANYIKA BOWLDS MRN: [id number] Date of Birth: [birth year]

## 2020-06-05 ENCOUNTER — Ambulatory Visit (HOSPITAL_COMMUNITY): Payer: Medicaid Other

## 2020-06-05 ENCOUNTER — Telehealth (HOSPITAL_COMMUNITY): Payer: Self-pay

## 2020-06-05 NOTE — Telephone Encounter (Signed)
pt called to cancel this appt and reschedule

## 2020-06-07 ENCOUNTER — Encounter (HOSPITAL_COMMUNITY): Payer: Self-pay | Admitting: *Deleted

## 2020-06-07 ENCOUNTER — Emergency Department (HOSPITAL_COMMUNITY)
Admission: EM | Admit: 2020-06-07 | Discharge: 2020-06-07 | Disposition: A | Discharge status: Home / Self Care | Payer: Medicaid Other | Attending: Emergency Medicine | Admitting: Emergency Medicine

## 2020-06-07 ENCOUNTER — Other Ambulatory Visit: Payer: Self-pay

## 2020-06-07 ENCOUNTER — Emergency Department (HOSPITAL_COMMUNITY): Payer: Medicaid Other

## 2020-06-07 DIAGNOSIS — I10 Essential (primary) hypertension: Secondary | ICD-10-CM | POA: Insufficient documentation

## 2020-06-07 DIAGNOSIS — Z7982 Long term (current) use of aspirin: Secondary | ICD-10-CM | POA: Insufficient documentation

## 2020-06-07 DIAGNOSIS — R069 Unspecified abnormalities of breathing: Secondary | ICD-10-CM | POA: Diagnosis not present

## 2020-06-07 DIAGNOSIS — R0682 Tachypnea, not elsewhere classified: Secondary | ICD-10-CM | POA: Diagnosis not present

## 2020-06-07 DIAGNOSIS — Z79899 Other long term (current) drug therapy: Secondary | ICD-10-CM | POA: Insufficient documentation

## 2020-06-07 DIAGNOSIS — M791 Myalgia, unspecified site: Secondary | ICD-10-CM | POA: Diagnosis not present

## 2020-06-07 DIAGNOSIS — R52 Pain, unspecified: Secondary | ICD-10-CM | POA: Diagnosis not present

## 2020-06-07 DIAGNOSIS — U071 COVID-19: Secondary | ICD-10-CM | POA: Insufficient documentation

## 2020-06-07 DIAGNOSIS — R0602 Shortness of breath: Secondary | ICD-10-CM | POA: Diagnosis not present

## 2020-06-07 LAB — RESP PANEL BY RT-PCR (FLU A&B, COVID) ARPGX2
Influenza A by PCR: NEGATIVE
Influenza B by PCR: NEGATIVE
SARS Coronavirus 2 by RT PCR: POSITIVE — AB

## 2020-06-07 IMAGING — DX DG CHEST 1V PORT
1 series · 1 of 1 positions shown · non-contrast
Comparison: [DATE]

CLINICAL DATA: Shortness of breath.  Coronavirus infection.

EXAM:
PORTABLE CHEST 1 VIEW

[chest ap]
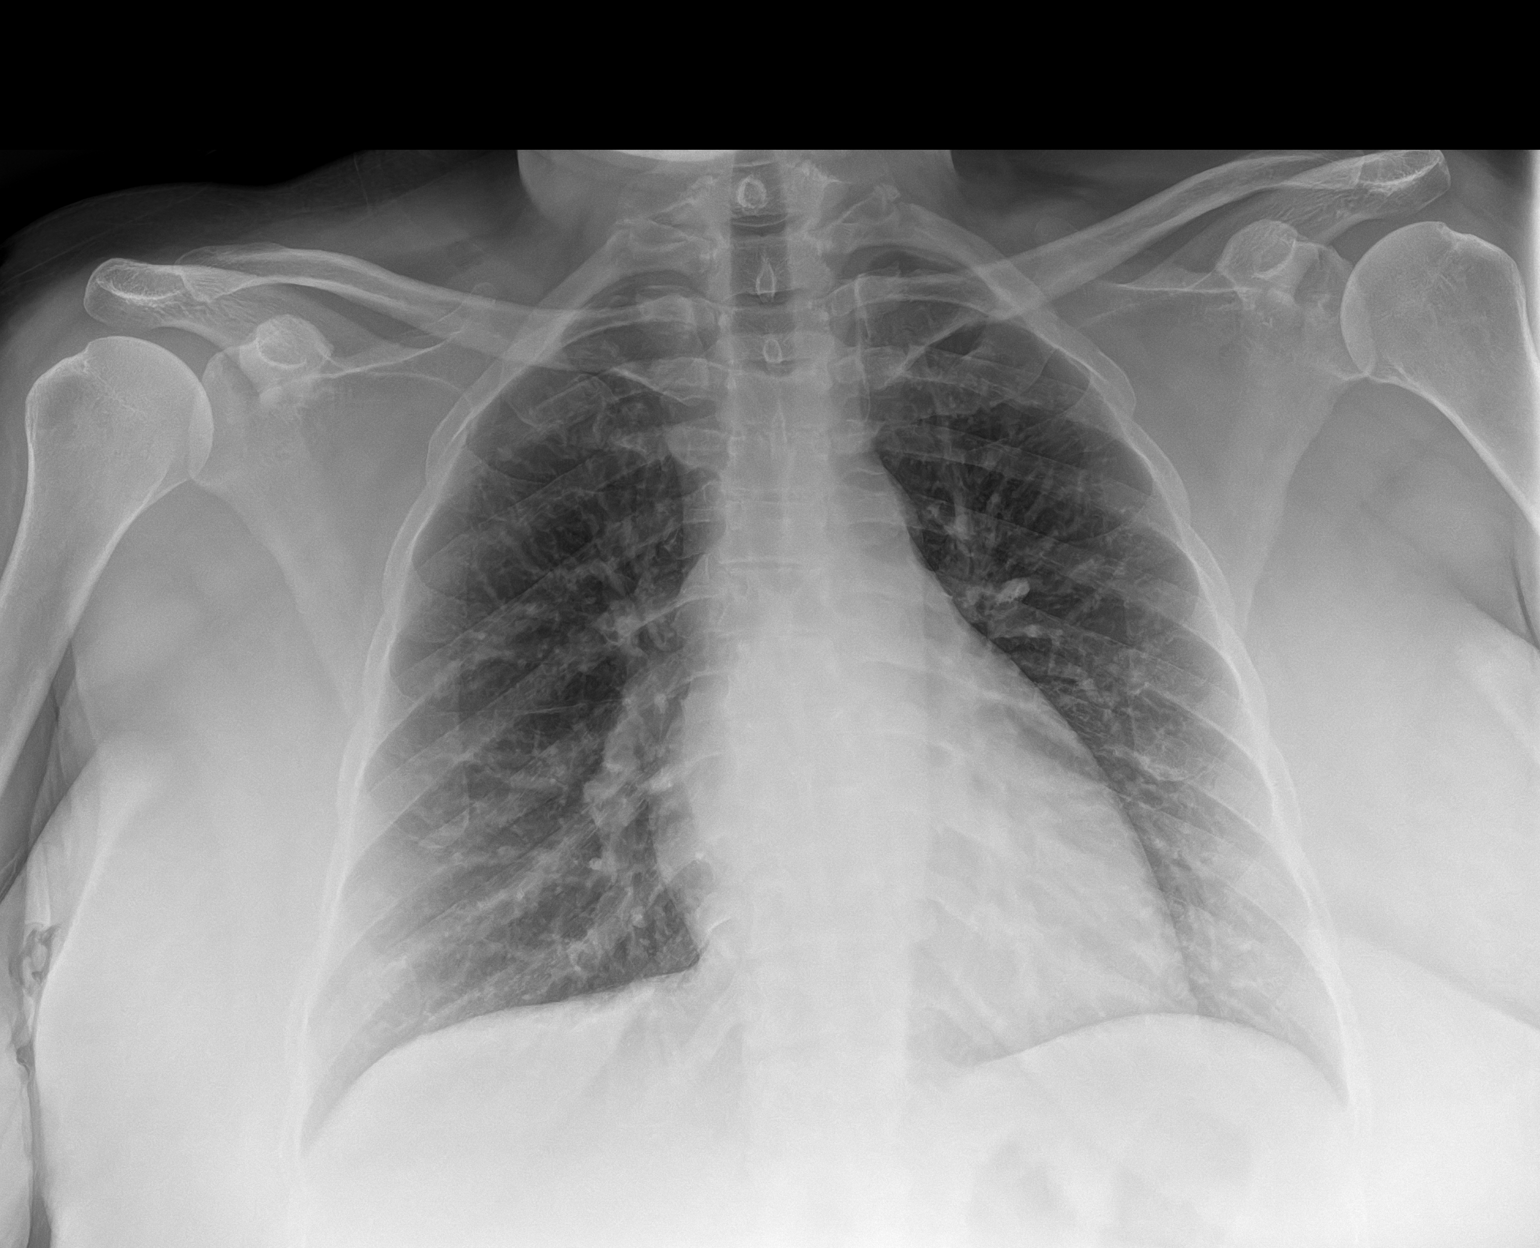

[1 of 1 positions shown; findings below may reference images not displayed]

FINDINGS: Heart and mediastinal shadows are normal. There is central bronchial
thickening but no infiltrate, collapse or effusion. Bony structures
are unremarkable.
IMPRESSION: Bronchitis pattern. No consolidation or collapse.

## 2020-06-07 MED ORDER — HYDROCODONE-ACETAMINOPHEN 5-325 MG PO TABS
1.0000 | ORAL_TABLET | Freq: Once | ORAL | Status: AC
Start: 2020-06-07 — End: 2020-06-07
  Administered 2020-06-07: 1 via ORAL
  Filled 2020-06-07: qty 1

## 2020-06-07 MED ORDER — IBUPROFEN 800 MG PO TABS
800.0000 mg | ORAL_TABLET | Freq: Once | ORAL | Status: AC
  Administered 2020-06-07: 800 mg via ORAL
  Filled 2020-06-07: qty 1

## 2020-06-07 MED ORDER — HYDROCODONE-ACETAMINOPHEN 5-325 MG PO TABS: 1.0000 | ORAL_TABLET | ORAL | 0 refills | Status: DC | PRN

## 2020-06-07 MED ORDER — IBUPROFEN 600 MG PO TABS: 600.0000 mg | ORAL_TABLET | Freq: Four times a day (QID) | ORAL | 0 refills | Status: DC | PRN

## 2020-06-07 MED ORDER — AEROCHAMBER Z-STAT PLUS/MEDIUM MISC: 1.0000 | Freq: Once | Status: DC

## 2020-06-07 MED ORDER — ALBUTEROL SULFATE HFA 108 (90 BASE) MCG/ACT IN AERS
3.0000 | INHALATION_SPRAY | RESPIRATORY_TRACT | Status: DC | PRN
  Filled 2020-06-07: qty 6.7

## 2020-06-07 NOTE — ED Notes (Signed)
Pt states everyone at home is sick as well.

## 2020-06-07 NOTE — ED Notes (Signed)
Pt walked to me while holding my hands, O2 stayed 96-99. Pt stated she felt weak and wanted to sit back on the bed.

## 2020-06-07 NOTE — ED Notes (Signed)
Pt in bed, pt c/o general body aches, states that her whole body hurts and she feels tired, pt states that she is un vaccinated.

## 2020-06-07 NOTE — ED Provider Notes (Signed)
[facility] EMERGENCY DEPARTMENT Provider Note   CSN: [id number] Arrival date & time: 06/07/20  1122     History No chief complaint on file.   Joyce [name] is a 45 y.o. female with a history of CVA with residual left-sided weakness but does ambulate using a walker, depression, hypertension, PTSD, presenting for evaluation of a 2-day history of worsening generalized body aches along with weakness. She states her entire household is ill with similar symptoms including 2 children who have had persistent cough. Patient does endorse a cough which has been nonproductive, she denies shortness of breath, also denies chest pain, nausea, vomiting, no abdominal pain also denies nasal congestion or rhinorrhea, no anosmia. She suspects she may have Covid, she is not vaccinated for this infection. She has taken multiple doses of ibuprofen with no significant imaging provement in her body aches.  HPI     Past Medical History:  Diagnosis Date  . Acute CVA (cerebrovascular accident) ([place]) 03/04/2020  . Acute CVA (cerebrovascular accident) ([place]) 03/04/2020  . Acute left-sided weakness 03/05/2020  . AMA (advanced maternal age) multigravida 35+ 11/12/2014  . Anemia   . Depression   . [place] multiparity 11/26/2014  . Hypertension   . Migraine   . Pregnant 11/12/2014  . PTSD (post-traumatic stress disorder) 05/01/2017  . Round ligament pain 11/12/2014  . Short of breath on exertion 11/12/2014  . Trichomonas infection     Patient Active Problem List   Diagnosis Date Noted  . History of CVA with residual deficit 05/15/2020  . Neuropathic pain 05/06/2020  . Encounter for IUD insertion 04/20/2020  . Anxiety state   . Dyslipidemia   . Labile blood pressure   . Migraine without status migrainosus, not intractable   . Right thalamic infarction ([place]) 03/10/2020  . Left hemiparesis ([place]) 03/10/2020  . Recurrent falls 03/05/2020  . Leukocytosis 03/05/2020  . MDD (major depressive disorder), recurrent  episode, moderate ([place]) 05/01/2017  . PTSD (post-traumatic stress disorder) 05/01/2017  . Depression, recurrent ([place]) 12/07/2016  . Essential hypertension 07/20/2016  . Uterine fibroid 01/27/2016  . Cervical high risk HPV (human papillomavirus) test positive 12/08/2014    Past Surgical History:  Procedure Laterality Date  . CHOLECYSTECTOMY       OB History    Gravida  11   Para  8   Term  8   Preterm  0   AB  3   Living  8     SAB  1   IAB  2   Ectopic      Multiple  0   Live Births  8           Family History  Problem Relation Age of Onset  . Diabetes Mother   . Hypertension Mother   . Kidney disease Mother   . Asthma Mother   . Hyperlipidemia Mother   . Alzheimer's disease Father   . Diabetes Father   . Kidney disease Sister   . Asthma Son   . Diabetes Sister   . Asthma Son   . ADD / ADHD Son   . Heart murmur Son   . Asthma Son     Social History   Tobacco Use  . Smoking status: Never Smoker  . Smokeless tobacco: Never Used  Vaping Use  . Vaping Use: Never used  Substance Use Topics  . Alcohol use: Not Currently    Comment: occasionally  . Drug use: No    Home Medications Prior to Admission  medications   Medication Sig Start Date End Date Taking? Authorizing Provider  ALPRAZolam (XANAX) 0.25 MG tablet Take 1 tablet (0.25 mg total) by mouth daily as needed for anxiety. 03/23/20  Yes Love, Ivan Anchors, PA-C  amLODipine (NORVASC) 5 MG tablet Take 1 tablet (5 mg total) by mouth daily. 03/23/20  Yes Love, Ivan Anchors, PA-C  aspirin 325 MG EC tablet Take 1 tablet (325 mg total) by mouth daily. 03/23/20  Yes Love, Ivan Anchors, PA-C  atorvastatin (LIPITOR) 80 MG tablet Take 1 tablet (80 mg total) by mouth daily. 03/23/20  Yes Love, Ivan Anchors, PA-C  Baclofen 5 MG TABS Take 1 tablet by mouth 3 (three) times daily. 04/06/20  Yes [provider]  escitalopram (LEXAPRO) 20 MG tablet Take 1 tablet (20 mg total) by mouth daily. 03/23/20  Yes Love,  Ivan Anchors, PA-C  gabapentin (NEURONTIN) 300 MG capsule Take 1 capsule (300 mg total) by mouth 4 (four) times daily. 1 cap twice daily and 2 caps at bedtime 05/06/20  Yes Meredith Staggers, MD  [facility] INTRAUTERINE COPPER IU by Intrauterine route.   Yes [provider]  topiramate (TOPAMAX) 25 MG tablet Take 1 tablet (25 mg total) by mouth daily. 03/23/20  Yes Love, Ivan Anchors, PA-C  traZODone (DESYREL) 100 MG tablet Take 100 mg by mouth at bedtime. 03/31/20  Yes [provider]  clopidogrel (PLAVIX) 75 MG tablet Take 1 tablet (75 mg total) by mouth daily with breakfast. Patient not taking: Reported on 06/07/2020 03/23/20   Bary Leriche, PA-C    Allergies    Patient has no known allergies.  Review of Systems   Review of Systems  Constitutional: Positive for fatigue. Negative for chills and fever.  HENT: Negative.  Negative for congestion.   Eyes: Negative.   Respiratory: Negative for chest tightness and shortness of breath.   Cardiovascular: Negative for chest pain.  Gastrointestinal: Negative for abdominal pain, nausea and vomiting.  Genitourinary: Negative.   Musculoskeletal: Positive for myalgias. Negative for arthralgias, joint swelling and neck pain.  Skin: Negative.  Negative for rash and wound.  Neurological: Positive for weakness. Negative for dizziness, light-headedness, numbness and headaches.  Psychiatric/Behavioral: Negative.     Physical Exam Updated Vital Signs BP (!) 141/87 (BP Location: Left Arm)   Pulse 73   Temp 98.8 F (37.1 C) (Oral)   Resp (!) 24   Ht 5\' 5"  (1.651 m)   Wt 99.8 kg   SpO2 100%   BMI 36.61 kg/m   Physical Exam Vitals and nursing note reviewed.  Constitutional:      Appearance: She is well-developed and well-nourished.  HENT:     Head: Normocephalic and atraumatic.  Eyes:     Conjunctiva/sclera: Conjunctivae normal.  Cardiovascular:     Rate and Rhythm: Normal rate and regular rhythm.     Pulses: Normal pulses and intact  distal pulses.     Heart sounds: Normal heart sounds.  Pulmonary:     Effort: Pulmonary effort is normal. Tachypnea present.     Breath sounds: Normal breath sounds. No wheezing.  Abdominal:     General: Bowel sounds are normal.     Palpations: Abdomen is soft.     Tenderness: There is no abdominal tenderness.  Musculoskeletal:        General: Tenderness present. No swelling. Normal range of motion.     Cervical back: Normal range of motion.     Right lower leg: No edema.  Left lower leg: No edema.     Comments: Tender to palpation upper and lower extremities, no edema, no focal areas of pain.   Skin:    General: Skin is warm and dry.  Neurological:     Mental Status: She is alert. Mental status is at baseline.     Cranial Nerves: No cranial nerve deficit.  Psychiatric:        Mood and Affect: Mood and affect normal.     ED Results / Procedures / Treatments   Labs (all labs ordered are listed, but only abnormal results are displayed) Labs Reviewed  RESP PANEL BY RT-PCR (FLU A&B, COVID) ARPGX2 - Abnormal; Notable for the following components:      Result Value   SARS Coronavirus 2 by RT PCR POSITIVE (*)    All other components within normal limits    EKG None  Radiology DG Chest Portable 1 View  Result Date: 06/07/2020 CLINICAL DATA:  Shortness of breath.  Coronavirus infection. EXAM: PORTABLE CHEST 1 VIEW COMPARISON:  03/27/2020 FINDINGS: Heart and mediastinal shadows are normal. There is central bronchial thickening but no infiltrate, collapse or effusion. Bony structures are unremarkable. IMPRESSION: Bronchitis pattern. No consolidation or collapse. Electronically Signed   By: Nelson Chimes M.D.   On: 06/07/2020 16:41    Procedures Procedures (including critical care time)  Medications Ordered in ED Medications  ibuprofen (ADVIL) tablet 800 mg (has no administration in time range)  HYDROcodone-acetaminophen (NORCO/VICODIN) 5-325 MG per tablet 1 tablet (1 tablet  Oral Given 06/07/20 1653)    ED Course  I have reviewed the triage vital signs and the nursing notes.  Pertinent labs & imaging results that were available during my care of the patient were reviewed by me and considered in my medical decision making (see chart for details).    MDM Rules/Calculators/A&P                         Pt with generalized myalgias, appears generally uncomfortable but does not appear toxic.  She is not hypoxic here, ambulated with no c/o sob and O2 saturation remained greater than 97%.  She has had persistent tachypnea while here.  Prior to dc, respiratory rate 24.  She still denies sob, but states she gets anxious being here which makes her sob.  She still has complaint of myalgias, but appears more comfortable.  Will plan dc home with pain medicine in combination with ibuprofen.  Strict return precautions given.  Discussed home quarantine per cdc recommendations.  Of note, not given albuterol mdi originally ordered as resp rate improved.  No wheezing on re-exam.  The patient appears reasonably screened and/or stabilized for discharge and I doubt any other medical condition or other [facility] requiring further screening, evaluation, or treatment in the ED at this time prior to discharge.  Joyce [name] was evaluated in Emergency Department on 06/07/2020 for the symptoms described in the history of present illness. She was evaluated in the context of the global COVID-19 pandemic, which necessitated consideration that the patient might be at risk for infection with the SARS-CoV-2 virus that causes COVID-19. Institutional protocols and algorithms that pertain to the evaluation of patients at risk for COVID-19 are in a state of rapid change based on information released by regulatory bodies including the CDC and federal and state organizations. These policies and algorithms were followed during the patient's care in the ED.   Final Clinical Impression(s) / ED Diagnoses Final  diagnoses:  COVID-19  Myalgia    Rx / DC Orders ED Discharge Orders    None       Landis Martins 06/07/20 1842    Fredia Sorrow, MD 06/18/20 1630

## 2020-06-07 NOTE — Discharge Instructions (Addendum)
Use the medicines prescribed for your body aches.  Rest and drink plenty of fluids.  Get rechecked by your primary MD or returning here if you develop any worsening weakness or you develop shortness of breath.    You will need to be in home quarantine as well as your family members for the next 5 days, after which you can maintain your regular routine, IF your symptoms are resolved and you are not running any fevers.     Person Under Monitoring Name: Joyce [name]  Location: [address] [place] [address]   Infection Prevention Recommendations for Individuals Confirmed to have, or Being Evaluated for, 2019 Novel Coronavirus (COVID-19) Infection Who Receive Care at Home  Individuals who are confirmed to have, or are being evaluated for, COVID-19 should follow the prevention steps below until a healthcare provider or local or state health department says they can return to normal activities.  Stay home except to get medical care You should restrict activities outside your home, except for getting medical care. Do not go to work, school, or public areas, and do not use public transportation or taxis.  Call ahead before visiting your doctor Before your medical appointment, call the healthcare provider and tell them that you have, or are being evaluated for, COVID-19 infection. This will help the healthcare providers office take steps to keep other people from getting infected. Ask your healthcare provider to call the local or state health department.  Monitor your symptoms Seek prompt medical attention if your illness is worsening (e.g., difficulty breathing). Before going to your medical appointment, call the healthcare provider and tell them that you have, or are being evaluated for, COVID-19 infection. Ask your healthcare provider to call the local or state health department.  Wear a facemask You should wear a facemask that covers your nose and mouth when you are in  the same room with other people and when you visit a healthcare provider. People who live with or visit you should also wear a facemask while they are in the same room with you.  Separate yourself from other people in your home As much as possible, you should stay in a different room from other people in your home. Also, you should use a separate bathroom, if available.  Avoid sharing household items You should not share dishes, drinking glasses, cups, eating utensils, towels, bedding, or other items with other people in your home. After using these items, you should wash them thoroughly with soap and water.  Cover your coughs and sneezes Cover your mouth and nose with a tissue when you cough or sneeze, or you can cough or sneeze into your sleeve. Throw used tissues in a lined trash can, and immediately wash your hands with soap and water for at least 20 seconds or use an alcohol-based hand rub.  Wash your [facility] your hands often and thoroughly with soap and water for at least 20 seconds. You can use an alcohol-based hand sanitizer if soap and water are not available and if your hands are not visibly dirty. Avoid touching your eyes, nose, and mouth with unwashed hands.   Prevention Steps for Caregivers and Household Members of Individuals Confirmed to have, or Being Evaluated for, COVID-19 Infection Being Cared for in the Home  If you live with, or provide care at home for, a person confirmed to have, or being evaluated for, COVID-19 infection please follow these guidelines to prevent infection:  Follow healthcare providers instructions Make sure that  you understand and can help the patient follow any healthcare provider instructions for all care.  Provide for the patients basic needs You should help the patient with basic needs in the home and provide support for getting groceries, prescriptions, and other personal needs.  Monitor the patients symptoms If they are getting  sicker, call his or her medical provider and tell them that the patient has, or is being evaluated for, COVID-19 infection. This will help the healthcare providers office take steps to keep other people from getting infected. Ask the healthcare provider to call the local or state health department.  Limit the number of people who have contact with the patient If possible, have only one caregiver for the patient. Other household members should stay in another home or place of residence. If this is not possible, they should stay in another room, or be separated from the patient as much as possible. Use a separate bathroom, if available. Restrict visitors who do not have an essential need to be in the home.  Keep older adults, very young children, and other sick people away from the patient Keep older adults, very young children, and those who have compromised immune systems or chronic health conditions away from the patient. This includes people with chronic heart, lung, or kidney conditions, diabetes, and cancer.  Ensure good ventilation Make sure that shared spaces in the home have good air flow, such as from an air conditioner or an opened window, weather permitting.  Wash your hands often Wash your hands often and thoroughly with soap and water for at least 20 seconds. You can use an alcohol based hand sanitizer if soap and water are not available and if your hands are not visibly dirty. Avoid touching your eyes, nose, and mouth with unwashed hands. Use disposable paper towels to dry your hands. If not available, use dedicated cloth towels and replace them when they become wet.  Wear a facemask and gloves Wear a disposable facemask at all times in the room and gloves when you touch or have contact with the patients blood, body fluids, and/or secretions or excretions, such as sweat, saliva, sputum, nasal mucus, vomit, urine, or feces.  Ensure the mask fits over your nose and mouth tightly,  and do not touch it during use. Throw out disposable facemasks and gloves after using them. Do not reuse. Wash your hands immediately after removing your facemask and gloves. If your personal clothing becomes contaminated, carefully remove clothing and launder. Wash your hands after handling contaminated clothing. Place all used disposable facemasks, gloves, and other waste in a lined container before disposing them with other household waste. Remove gloves and wash your hands immediately after handling these items.  Do not share dishes, glasses, or other household items with the patient Avoid sharing household items. You should not share dishes, drinking glasses, cups, eating utensils, towels, bedding, or other items with a patient who is confirmed to have, or being evaluated for, COVID-19 infection. After the person uses these items, you should wash them thoroughly with soap and water.  Wash laundry thoroughly Immediately remove and wash clothes or bedding that have blood, body fluids, and/or secretions or excretions, such as sweat, saliva, sputum, nasal mucus, vomit, urine, or feces, on them. Wear gloves when handling laundry from the patient. Read and follow directions on labels of laundry or clothing items and detergent. In general, wash and dry with the warmest temperatures recommended on the label.  Clean all areas the individual has used  often Clean all touchable surfaces, such as counters, tabletops, doorknobs, bathroom fixtures, toilets, phones, keyboards, tablets, and bedside tables, every day. Also, clean any surfaces that may have blood, body fluids, and/or secretions or excretions on them. Wear gloves when cleaning surfaces the patient has come in contact with. Use a diluted bleach solution (e.g., dilute bleach with 1 part bleach and 10 parts water) or a household disinfectant with a label that says EPA-registered for coronaviruses. To make a bleach solution at home, add 1 tablespoon  of bleach to 1 quart (4 cups) of water. For a larger supply, add  cup of bleach to 1 gallon (16 cups) of water. Read labels of cleaning products and follow recommendations provided on product labels. Labels contain instructions for safe and effective use of the cleaning product including precautions you should take when applying the product, such as wearing gloves or eye protection and making sure you have good ventilation during use of the product. Remove gloves and wash hands immediately after cleaning.  Monitor yourself for signs and symptoms of illness Caregivers and household members are considered close contacts, should monitor their health, and will be asked to limit movement outside of the home to the extent possible. Follow the monitoring steps for close contacts listed on the symptom monitoring form.   ? If you have additional questions, contact your local health department or call the epidemiologist on call at [phone] (available 24/7). ? This guidance is subject to change. For the most up-to-date guidance from [facility], please refer to their website: [email or web address]

## 2020-06-07 NOTE — ED Triage Notes (Signed)
Pt c/o pain all over for past 2 days.  Pt states she has tried "everything" for pain.

## 2020-06-08 ENCOUNTER — Telehealth: Payer: Self-pay

## 2020-06-08 NOTE — Telephone Encounter (Signed)
Attempted to reach pt. In regard to further treatment for COVID 19. Left message with Infusion hotline number [phone].

## 2020-06-09 ENCOUNTER — Ambulatory Visit: Payer: Medicaid Other | Admitting: Neurology

## 2020-06-11 ENCOUNTER — Ambulatory Visit (HOSPITAL_COMMUNITY): Payer: Medicaid Other

## 2020-06-16 ENCOUNTER — Other Ambulatory Visit: Payer: Self-pay | Admitting: Physical Medicine & Rehabilitation

## 2020-06-16 DIAGNOSIS — M792 Neuralgia and neuritis, unspecified: Secondary | ICD-10-CM

## 2020-06-18 DIAGNOSIS — E785 Hyperlipidemia, unspecified: Secondary | ICD-10-CM | POA: Diagnosis not present

## 2020-06-18 DIAGNOSIS — Z139 Encounter for screening, unspecified: Secondary | ICD-10-CM | POA: Diagnosis not present

## 2020-06-18 DIAGNOSIS — Z7689 Persons encountering health services in other specified circumstances: Secondary | ICD-10-CM | POA: Diagnosis not present

## 2020-06-19 ENCOUNTER — Encounter: Payer: Self-pay | Admitting: Nurse Practitioner

## 2020-06-19 ENCOUNTER — Ambulatory Visit (INDEPENDENT_AMBULATORY_CARE_PROVIDER_SITE_OTHER): Payer: Medicaid Other | Admitting: Nurse Practitioner

## 2020-06-19 ENCOUNTER — Other Ambulatory Visit: Payer: Self-pay

## 2020-06-19 DIAGNOSIS — D72829 Elevated white blood cell count, unspecified: Secondary | ICD-10-CM | POA: Diagnosis not present

## 2020-06-19 DIAGNOSIS — R109 Unspecified abdominal pain: Secondary | ICD-10-CM | POA: Insufficient documentation

## 2020-06-19 DIAGNOSIS — F339 Major depressive disorder, recurrent, unspecified: Secondary | ICD-10-CM | POA: Diagnosis not present

## 2020-06-19 DIAGNOSIS — R1084 Generalized abdominal pain: Secondary | ICD-10-CM | POA: Diagnosis not present

## 2020-06-19 LAB — CMP14+EGFR
ALT: 23 IU/L (ref 0–32)
AST: 21 IU/L (ref 0–40)
Albumin/Globulin Ratio: 1.2 (ref 1.2–2.2)
Albumin: 4.2 g/dL (ref 3.8–4.8)
Alkaline Phosphatase: 69 IU/L (ref 44–121)
BUN/Creatinine Ratio: 18 (ref 9–23)
BUN: 14 mg/dL (ref 6–24)
Bilirubin Total: 0.8 mg/dL (ref 0.0–1.2)
CO2: 22 mmol/L (ref 20–29)
Calcium: 9.6 mg/dL (ref 8.7–10.2)
Chloride: 106 mmol/L (ref 96–106)
Creatinine, Ser: 0.78 mg/dL (ref 0.57–1.00)
GFR calc Af Amer: 108 mL/min/{1.73_m2} (ref >59)
GFR calc non Af Amer: 93 mL/min/{1.73_m2} (ref >59)
Globulin, Total: 3.4 g/dL (ref 1.5–4.5)
Glucose: 78 mg/dL (ref 65–99)
Potassium: 3.8 mmol/L (ref 3.5–5.2)
Sodium: 142 mmol/L (ref 134–144)
Total Protein: 7.6 g/dL (ref 6.0–8.5)

## 2020-06-19 LAB — CBC WITH DIFFERENTIAL/PLATELET
Basophils Absolute: 0 10*3/uL (ref 0.0–0.2)
Basos: 0 %
EOS (ABSOLUTE): 0 10*3/uL (ref 0.0–0.4)
Eos: 0 %
Hematocrit: 46.5 % (ref 34.0–46.6)
Hemoglobin: 15.3 g/dL (ref 11.1–15.9)
Immature Grans (Abs): 0 10*3/uL (ref 0.0–0.1)
Immature Granulocytes: 0 %
Lymphocytes Absolute: 2.4 10*3/uL (ref 0.7–3.1)
Lymphs: 24 %
MCH: 26.4 pg — ABNORMAL LOW (ref 26.6–33.0)
MCHC: 32.9 g/dL (ref 31.5–35.7)
MCV: 80 fL (ref 79–97)
Monocytes Absolute: 0.5 10*3/uL (ref 0.1–0.9)
Monocytes: 5 %
Neutrophils Absolute: 7 10*3/uL (ref 1.4–7.0)
Neutrophils: 71 %
Platelets: 275 10*3/uL (ref 150–450)
RBC: 5.79 x10E6/uL — ABNORMAL HIGH (ref 3.77–5.28)
RDW: 13.6 % (ref 11.7–15.4)
WBC: 10 10*3/uL (ref 3.4–10.8)

## 2020-06-19 LAB — LIPID PANEL WITH LDL/HDL RATIO
Cholesterol, Total: 123 mg/dL (ref 100–199)
HDL: 39 mg/dL — ABNORMAL LOW (ref >39)
LDL Chol Calc (NIH): 67 mg/dL (ref 0–99)
LDL/HDL Ratio: 1.7 ratio (ref 0.0–3.2)
Triglycerides: 88 mg/dL (ref 0–149)
VLDL Cholesterol Cal: 17 mg/dL (ref 5–40)

## 2020-06-19 LAB — HCV INTERPRETATION

## 2020-06-19 LAB — HCV AB W/RFLX TO VERIFICATION: HCV Ab: <0.1 s/co ratio (ref 0.0–0.9)

## 2020-06-19 NOTE — Patient Instructions (Signed)
It is great to see you again today.  For your abdominal pain, I am ordering an ultrasound of your abdomen.   I recommend calling your GYN and setting up an appointment to discuss your vaginal bleeding.  I also referred you to the therapy office that is on the second floor of our building.  If you don't hear from them in a week, please let us know.

## 2020-06-19 NOTE — Assessment & Plan Note (Signed)
resolved 

## 2020-06-19 NOTE — Progress Notes (Signed)
Established Patient Office Visit  Subjective:  Patient ID: Joyce [name], female    DOB: March 05, 1976  Age: 45 y.o. MRN: [id number]  CC:  Chief Complaint  Patient presents with  . Follow-up  . Hypertension    HPI Joyce [name] presents for lab follow-up. She was COVID positive 13 days ago, 06/07/20, at [facility] ED.  She states that she is feeling much better after having COVID.  She states she has mild abdominal pain. She states she is still bleeding after IUD insertion and she wonders if this is related.  She sees [facility] for GYN, and had her IUD placed in either early Dec or late Nov 2021. She swapped from [place] after having a CVA, and she is concerned with heavy vaginal bleeding that is occurring daily, not just with menstruation.  Past Medical History:  Diagnosis Date  . Acute CVA (cerebrovascular accident) ([place]) 03/04/2020  . Acute CVA (cerebrovascular accident) ([place]) 03/04/2020  . Acute left-sided weakness 03/05/2020  . AMA (advanced maternal age) multigravida 35+ 11/12/2014  . Anemia   . Depression   . [place] multiparity 11/26/2014  . Hypertension   . Migraine   . Pregnant 11/12/2014  . PTSD (post-traumatic stress disorder) 05/01/2017  . Round ligament pain 11/12/2014  . Short of breath on exertion 11/12/2014  . Trichomonas infection     Past Surgical History:  Procedure Laterality Date  . CHOLECYSTECTOMY      Family History  Problem Relation Age of Onset  . Diabetes Mother   . Hypertension Mother   . Kidney disease Mother   . Asthma Mother   . Hyperlipidemia Mother   . Alzheimer's disease Father   . Diabetes Father   . Kidney disease Sister   . Asthma Son   . Diabetes Sister   . Asthma Son   . ADD / ADHD Son   . Heart murmur Son   . Asthma Son     Social History   Socioeconomic History  . Marital status: Significant Other    Spouse name: [name]  . Number of children: 8  . Years of education: 58  . Highest education level: Not on file   Occupational History  . Occupation: unemployed    Comment: disabled  Tobacco Use  . Smoking status: Never Smoker  . Smokeless tobacco: Never Used  Vaping Use  . Vaping Use: Never used  Substance and Sexual Activity  . Alcohol use: Not Currently    Comment: occasionally  . Drug use: No  . Sexual activity: Yes    Birth control/protection: I.U.D.    Comment: followed by GYN  Other Topics Concern  . Not on file  Social History Narrative   Lives with 5 children   [name], his mother   Fiancee's two children   Social Determinants of Health   Financial Resource Strain: Not on file  Food Insecurity: Not on file  Transportation Needs: Not on file  Physical Activity: Not on file  Stress: Not on file  Social Connections: Not on file  Intimate Partner Violence: Not on file    Outpatient Medications Prior to Visit  Medication Sig Dispense Refill  . ALPRAZolam (XANAX) 0.25 MG tablet Take 1 tablet (0.25 mg total) by mouth daily as needed for anxiety. 30 tablet 0  . amLODipine (NORVASC) 5 MG tablet Take 1 tablet (5 mg total) by mouth daily. 30 tablet 0  . aspirin 325 MG EC tablet Take 1 tablet (325 mg total) by mouth daily.  100 tablet 0  . atorvastatin (LIPITOR) 80 MG tablet Take 1 tablet (80 mg total) by mouth daily. 30 tablet 0  . Baclofen 5 MG TABS Take 1 tablet by mouth 3 (three) times daily.    . clopidogrel (PLAVIX) 75 MG tablet Take 1 tablet (75 mg total) by mouth daily with breakfast. 30 tablet 0  . escitalopram (LEXAPRO) 20 MG tablet Take 1 tablet (20 mg total) by mouth daily. 20 tablet 0  . gabapentin (NEURONTIN) 300 MG capsule TAKE 1 CAPSULE BY MOUTH TWICE DAILY AND 2 CAPSULES EVERY NIGHT AT BEDTIME 120 capsule 1  . HYDROcodone-acetaminophen (NORCO/VICODIN) 5-325 MG tablet Take 1 tablet by mouth every 4 (four) hours as needed for moderate pain. 15 tablet 0  . ibuprofen (ADVIL) 600 MG tablet Take 1 tablet (600 mg total) by mouth every 6 (six) hours as needed. 30 tablet 0  .  PARAGARD INTRAUTERINE COPPER IU by Intrauterine route.    . topiramate (TOPAMAX) 25 MG tablet Take 1 tablet (25 mg total) by mouth daily. 30 tablet 0  . traZODone (DESYREL) 100 MG tablet Take 100 mg by mouth at bedtime.     No facility-administered medications prior to visit.    No Known Allergies  ROS Review of Systems  Constitutional: Negative.   Respiratory: Negative.   Cardiovascular: Negative.   Gastrointestinal: Positive for abdominal pain.  Genitourinary: Positive for vaginal bleeding.       Since getting IUD placement      Objective:    Physical Exam Constitutional:      Appearance: She is obese.  Cardiovascular:     Rate and Rhythm: Normal rate and regular rhythm.     Pulses: Normal pulses.     Heart sounds: Normal heart sounds.  Pulmonary:     Effort: Pulmonary effort is normal.     Breath sounds: Normal breath sounds.  Abdominal:     General: Abdomen is flat. There is no distension.     Palpations: Abdomen is soft. There is no mass.     Tenderness: There is abdominal tenderness. There is no rebound.     Hernia: No hernia is present.  Neurological:     Mental Status: She is alert.     BP 134/80   Pulse 90   Temp 97.6 F (36.4 C)   Resp 20   Ht 5\' 5"  (1.651 m)   Wt 231 lb (104.8 kg)   SpO2 98%   BMI 38.44 kg/m  Wt Readings from Last 3 Encounters:  06/19/20 231 lb (104.8 kg)  06/07/20 220 lb (99.8 kg)  05/18/20 237 lb (107.5 kg)     Health Maintenance Due  Topic Date Due  . COVID-19 Vaccine (1) Never done  . INFLUENZA VACCINE  12/29/2019    There are no preventive care reminders to display for this patient.  Lab Results  Component Value Date   TSH 0.523 03/06/2020   Lab Results  Component Value Date   WBC 10.0 06/18/2020   HGB 15.3 06/18/2020   HCT 46.5 06/18/2020   MCV 80 06/18/2020   PLT 275 06/18/2020   Lab Results  Component Value Date   NA 142 06/18/2020   K 3.8 06/18/2020   CO2 22 06/18/2020   GLUCOSE 78 06/18/2020    BUN 14 06/18/2020   CREATININE 0.78 06/18/2020   BILITOT 0.8 06/18/2020   ALKPHOS 69 06/18/2020   AST 21 06/18/2020   ALT 23 06/18/2020   PROT 7.6 06/18/2020   ALBUMIN  4.2 06/18/2020   CALCIUM 9.6 06/18/2020   ANIONGAP 9 05/20/2020   Lab Results  Component Value Date   CHOL 123 06/18/2020   Lab Results  Component Value Date   HDL 39 (L) 06/18/2020   Lab Results  Component Value Date   LDLCALC 67 06/18/2020   Lab Results  Component Value Date   TRIG 88 06/18/2020   Lab Results  Component Value Date   CHOLHDL 4.1 03/05/2020   Lab Results  Component Value Date   HGBA1C 5.6 03/05/2020      Assessment & Plan:   Problem List Items Addressed This Visit      Other   Depression, recurrent ([place])    -was referred for therapy at last visit, but she states therapy never reached out to her -will refer to different therapist      Abdominal pain    -generalized pain on palpation -she states this has been ongoing for about 2 weeks -WBC and LFTs are unremarkable -? Related to vaginal bleeding -will get abdominal u/s -recommended going back to GYN to re-evaluate IUD -if nothing obvious on u/s, will consider GI referral      Relevant Orders   US Abdomen Complete   RESOLVED: Leukocytosis    resolved         No orders of the defined types were placed in this encounter.   Follow-up: Return in about 4 months (around 10/17/2020) for Lab follow-up; come in sooner if abdominal pain gets worse.    Noreene Larsson, NP

## 2020-06-19 NOTE — Assessment & Plan Note (Addendum)
-  generalized pain on palpation -she states this has been ongoing for about 2 weeks -WBC and LFTs are unremarkable -? Related to vaginal bleeding -will get abdominal u/s -recommended going back to GYN to re-evaluate IUD -if nothing obvious on u/s, will consider GI referral

## 2020-06-19 NOTE — Assessment & Plan Note (Signed)
-  was referred for therapy at last visit, but she states therapy never reached out to her -will refer to different therapist

## 2020-06-19 NOTE — Progress Notes (Signed)
We will discuss these at her appointment today.

## 2020-06-22 ENCOUNTER — Ambulatory Visit (HOSPITAL_COMMUNITY): Payer: Medicaid Other

## 2020-06-22 ENCOUNTER — Ambulatory Visit (HOSPITAL_COMMUNITY): Payer: Medicaid Other | Admitting: Physical Therapy

## 2020-06-22 ENCOUNTER — Telehealth (HOSPITAL_COMMUNITY): Payer: Self-pay | Admitting: Physical Therapy

## 2020-06-22 NOTE — Telephone Encounter (Signed)
pt cancelled appts for today because she is not feeling well

## 2020-06-24 ENCOUNTER — Ambulatory Visit (HOSPITAL_COMMUNITY): Payer: Medicaid Other | Admitting: Specialist

## 2020-06-24 ENCOUNTER — Encounter (HOSPITAL_COMMUNITY): Payer: Self-pay | Admitting: Specialist

## 2020-06-24 ENCOUNTER — Other Ambulatory Visit: Payer: Self-pay

## 2020-06-24 DIAGNOSIS — M6281 Muscle weakness (generalized): Secondary | ICD-10-CM | POA: Diagnosis not present

## 2020-06-24 DIAGNOSIS — R29818 Other symptoms and signs involving the nervous system: Secondary | ICD-10-CM

## 2020-06-24 DIAGNOSIS — M25612 Stiffness of left shoulder, not elsewhere classified: Secondary | ICD-10-CM | POA: Diagnosis not present

## 2020-06-24 DIAGNOSIS — R278 Other lack of coordination: Secondary | ICD-10-CM | POA: Diagnosis not present

## 2020-06-24 DIAGNOSIS — R262 Difficulty in walking, not elsewhere classified: Secondary | ICD-10-CM | POA: Diagnosis not present

## 2020-06-24 NOTE — Therapy (Signed)
[place], [place], [address] Phone: [phone]   Fax:  [phone]  Occupational Therapy Evaluation  Joyce [name] Details  Name: Joyce [name] MRN: [id number] Date of Birth: 1975/11/19 Referring Provider (OT): Dr. Alger Simons   Encounter Date: 06/24/2020   OT End of Session - 06/24/20 1616    Visit Number 1    Number of Visits 12    Date for OT Re-Evaluation 08/05/20    Authorization Type [employer]    Authorization Time Period requesting 12 visits from 06/24/20-08/05/20    Authorization - Visit Number 0    Authorization - Number of Visits 12    OT Start Time 0915    OT Stop Time 1005    OT Time Calculation (min) 50 min    Activity Tolerance Joyce [name] tolerated treatment well    Behavior During Therapy [facility] for tasks assessed/performed           Past Medical History:  Diagnosis Date  . Acute CVA (cerebrovascular accident) ([place]) 03/04/2020  . Acute CVA (cerebrovascular accident) ([place]) 03/04/2020  . Acute left-sided weakness 03/05/2020  . AMA (advanced maternal age) multigravida 35+ 11/12/2014  . Anemia   . Depression   . [place] multiparity 11/26/2014  . Hypertension   . Migraine   . Pregnant 11/12/2014  . PTSD (post-traumatic stress disorder) 05/01/2017  . Round ligament pain 11/12/2014  . Short of breath on exertion 11/12/2014  . Trichomonas infection     Past Surgical History:  Procedure Laterality Date  . CHOLECYSTECTOMY      There were no vitals filed for this visit.   Subjective Assessment - 06/24/20 1612    Subjective  S:  the function in my arm and leg comes and goes.  sometimes are better than others.    Pertinent History Joyce [name] reports having a CVA affecting her left side in October 2021.  She reports that she has decreased ability to use her non dominant arm with some functional tasks due to numbness, pain, and decreased coordination.    Joyce [name] Stated Goals I want to use my left arm  like normal.    Currently in Pain? Yes    Pain Score 3     Pain Location Shoulder    Pain Orientation Left    Pain Descriptors / Indicators Aching    Pain Type Acute pain    Pain Onset More than a month ago    Pain Frequency Intermittent    Aggravating Factors  with movement above shoulder height    Pain Relieving Factors rest    Effect of Pain on Daily Activities moderate             [facility] OT Assessment - 06/24/20 1629      Assessment   Medical Diagnosis Left Hemiplegia    Referring Provider (OT) Dr. Alger Simons    Onset Date/Surgical Date --   October2021   Hand Dominance Right    Prior Therapy yes at hospital, current receive OP PT      Precautions   Precautions Fall      Restrictions   Weight Bearing Restrictions No      Balance Screen   Has the Joyce [name] fallen in the past 6 months No    Has the Joyce [name] had a decrease in activity level because of a fear of falling?  No    Is the Joyce [name] reluctant to leave their home because of a fear of falling?  No      Home  Environment   Family/Joyce [name] expects to be discharged to: Private residence    Living Arrangements Other relatives    Available Help at Discharge Family    Type of Home House      Prior Function   Level of [place] Full time employment    Conservation officer, nature    Leisure spending time with family      ADL   ADL comments unable to reach overhead with left arm due to shoulder pain, difficulty dressing herself,difficulty picking up and maintaining grasp on objects, and difficulty with fine motor coordination.      Mobility   Mobility Status --   uses rolling walker for ambulating.     Written Expression   Dominant Hand Right      Vision - History   Baseline Vision No visual deficits      Cognition   Overall Cognitive Status Within Functional Limits for tasks assessed      Observation/Other Assessments   Focus on Therapeutic Outcomes (FOTO)  grip and  pinch strength assessment      Sensation   Light Touch Appears Intact   light touch in tact, reports pins and needles sensation in her left hand     Coordination   Gross Motor Movements are Fluid and Coordinated Yes    9 Hole Peg Test Right;Left    Right 9 Hole Peg Test 25.13"    Left 9 Hole Peg Test 39.27"      ROM / Strength   AROM / PROM / Strength AROM;Strength      AROM   AROM Assessment Site Shoulder;Elbow;Forearm;Wrist    Right/Left Shoulder Left    Left Shoulder Flexion 100 Degrees    Left Shoulder ABduction 90 Degrees    Left Shoulder Internal Rotation 80 Degrees    Left Shoulder External Rotation 15 Degrees    Right/Left Elbow Left    Left Elbow Flexion 130    Left Elbow Extension 0    Right/Left Forearm Left    Left Forearm Pronation 90 Degrees    Left Forearm Supination 90 Degrees    Right/Left Wrist Left    Left Wrist Extension 60 Degrees    Left Wrist Flexion 60 Degrees      Strength   Strength Assessment Site Shoulder;Elbow;Forearm;Wrist    Right/Left Shoulder Left    Left Shoulder Flexion 3+/5    Left Shoulder Extension 3+/5    Left Shoulder ABduction 3+/5    Left Shoulder Internal Rotation 3+/5    Left Shoulder External Rotation 3+/5    Right/Left Elbow Left    Left Elbow Flexion 4-/5    Left Elbow Extension 4-/5    Right/Left Forearm Left    Left Forearm Pronation 3+/5    Left Forearm Supination 3+/5    Right/Left Wrist Left    Left Wrist Flexion 3+/5    Left Wrist Extension 3+/5      Hand Function   Right Hand Grip (lbs) 20    Right Hand Lateral Pinch 10 lbs    Right Hand 3 Point Pinch 10 lbs    Left Hand Grip (lbs) 5    Left Hand Lateral Pinch 4 lbs    Left 3 point pinch 4 lbs                           OT Education - 06/24/20 1615  Education Details educated Joyce [name] on goals and POC for OT intervention, shoulder table stretches and fine motor coordination.    Person(s) Educated Joyce [name]    Methods  Explanation;Demonstration;Handout    Comprehension Verbalized understanding;Returned demonstration            OT Short Term Goals - 06/24/20 1621      OT SHORT TERM GOAL #1   Title Joyce [name] will be educated and independent with HEP for improved functional use of LUE.    Time 3    Period Weeks    Status New    Target Date 07/15/20      OT SHORT TERM GOAL #2   Title Joyce [name] will improve LUE A/ROM to [facility] in order to use LUE as an active assist with daily tasks.    Time 3    Period Weeks    Status New      OT SHORT TERM GOAL #3   Title Joyce [name] will improve LUE strength to 4/5 or better for increased ability to reach into overhead cabinet.    Time 3    Period Weeks    Status New             OT Long Term Goals - 06/24/20 1622      OT LONG TERM GOAL #1   Title Joyce [name] will complete B/IADLs, leisure tasks at highest level of I possible.    Time 6    Period Weeks    Status New    Target Date 08/05/20      OT LONG TERM GOAL #2   Title Joyce [name] will improve left upper extremity grip strength to 15# or better and pinch strength to 7# or better for increased independence in opening containers.    Time 6    Period Weeks    Status New      OT LONG TERM GOAL #3   Title Joyce [name] will decrease completion time on nine hole peg test to 30" or less with left hand for improved coordination needed to tie shoes, braid hair, manipulate change.    Time 6    Period Weeks    Status New      OT LONG TERM GOAL #4   Title Joyce [name] will improve left hand sensation to [facility] with decreased pins and needles sensation.    Time 6    Period Weeks    Status New                 Plan - 06/24/20 1617    Clinical Impression Statement A:  Joyce [name] is a 45 year old female with past medical history signifiant for CVA, PTSD, migraine, depression.  Joyce [name] had a right CVA with left hemiparesis in Oct 2021.  She has difficulty using her left arm with functional tasks, unable to work, unable to drive  secondary to deficits listed below.    OT Occupational Profile and History Detailed Assessment- Review of Records and additional review of physical, cognitive, psychosocial history related to current functional performance    Occupational performance deficits (Please refer to evaluation for details): ADL's;IADL's;Work;Leisure    Body Structure / Function / Physical Skills ADL;Strength;Dexterity;Pain;UE functional use;IADL;ROM;Sensation;Coordination;FMC    Rehab Potential Good    Clinical Decision Making Several treatment options, min-mod task modification necessary    Comorbidities Affecting Occupational Performance: May have comorbidities impacting occupational performance    Modification or Assistance to Complete Evaluation  Min-Moderate modification of tasks or assist with assess necessary to complete eval  OT Frequency 2x / week    OT Duration 6 weeks    OT Treatment/Interventions Self-care/ADL training;Therapeutic exercise;Joyce [name]/family education;Neuromuscular education;Splinting;Moist Heat;Energy conservation;Therapeutic activities;Passive range of motion;Manual Therapy;DME and/or AE instruction;Ultrasound;Cryotherapy    Plan P:  Skilled OT intervention to improve A/ROM, strength, grip/pinch strength, coordination and sensation to [facility] in order to use LUE as an active assist at prior level of function with all desired daily, work, leisure tasks. next session:  HEP for grip strengthening, begin functional reaching, strengthening, coordination exercises.    OT Home Exercise Plan 1/26:  table slides and [facility] training    Consulted and Agree with Plan of Care Joyce [name]           Joyce [name] will benefit from skilled therapeutic intervention in order to improve the following deficits and impairments:   Body Structure / Function / Physical Skills: ADL,Strength,Dexterity,Pain,UE functional use,IADL,ROM,Sensation,Coordination,FMC       Visit Diagnosis: Other lack of coordination  Other symptoms  and signs involving the nervous system  Stiffness of left shoulder, not elsewhere classified    Problem List Joyce [name] Active Problem List   Diagnosis Date Noted  . Abdominal pain 06/19/2020  . History of CVA with residual deficit 05/15/2020  . Neuropathic pain 05/06/2020  . Encounter for IUD insertion 04/20/2020  . Anxiety state   . Dyslipidemia   . Labile blood pressure   . Migraine without status migrainosus, not intractable   . Right thalamic infarction ([place]) 03/10/2020  . Left hemiparesis ([place]) 03/10/2020  . Recurrent falls 03/05/2020  . MDD (major depressive disorder), recurrent episode, moderate ([place]) 05/01/2017  . PTSD (post-traumatic stress disorder) 05/01/2017  . Depression, recurrent ([place]) 12/07/2016  . Essential hypertension 07/20/2016  . Uterine fibroid 01/27/2016  . Cervical high risk HPV (human papillomavirus) test positive 12/08/2014    Vangie Bicker, [place], OTR/L [phone]  06/24/2020, 4:57 PM  [facility] [address] [place], [place], [address] Phone: [phone]   Fax:  [phone]  Name: CYNITHIA HAKIMI MRN: [id number] Date of Birth: [birth year]

## 2020-06-24 NOTE — Patient Instructions (Signed)
  Coordination Activities  Perform the following activities for 10 minutes 2 times per day with left hand(s).   Rotate ball in fingertips (clockwise and counter-clockwise).  Flip cards 1 at a time as fast as you can.  Rotate card in hand (clockwise and counter-clockwise).  Pick up coins, buttons, marbles, dried beans/pasta of different sizes and place in container.  Pick up coins and place in container or coin bank.  Pick up coins and stack.  Pick up coins one at a time until you get 5-10 in your hand, then move coins from palm to fingertips to stack one at a time.  Twirl pen between fingers.  Screw together nuts and bolts, then unfasten.   1) SHOULDER: Flexion On Table   Place hands on towel placed on table, elbows straight. Lean forward with you upper body, pushing towel away from body.  15___ reps per set, __2_ sets per day  2) Abduction (Passive)   With arm out to side, resting on towel placed on table with palm DOWN, keeping trunk away from table, lean to the side while pushing towel away from body.  Repeat _15___ times. Do __2__ sessions per day.  Copyright  VHI. All rights reserved.     3) Internal Rotation (Assistive)   Seated with elbow bent at right angle and held against side, slide arm on table surface in an inward arc keeping elbow anchored in place. Repeat __15__ times. Do __2__ sessions per day. Activity: Use this motion to brush crumbs off the table.  Copyright  VHI. All rights reserved.

## 2020-06-26 ENCOUNTER — Other Ambulatory Visit: Payer: Self-pay

## 2020-06-26 ENCOUNTER — Ambulatory Visit (HOSPITAL_COMMUNITY): Payer: Medicaid Other | Admitting: Specialist

## 2020-06-26 ENCOUNTER — Encounter (HOSPITAL_COMMUNITY): Payer: Self-pay | Admitting: Specialist

## 2020-06-26 DIAGNOSIS — R29818 Other symptoms and signs involving the nervous system: Secondary | ICD-10-CM

## 2020-06-26 DIAGNOSIS — R262 Difficulty in walking, not elsewhere classified: Secondary | ICD-10-CM | POA: Diagnosis not present

## 2020-06-26 DIAGNOSIS — R278 Other lack of coordination: Secondary | ICD-10-CM | POA: Diagnosis not present

## 2020-06-26 DIAGNOSIS — M6281 Muscle weakness (generalized): Secondary | ICD-10-CM | POA: Diagnosis not present

## 2020-06-26 DIAGNOSIS — M25612 Stiffness of left shoulder, not elsewhere classified: Secondary | ICD-10-CM

## 2020-06-26 NOTE — Therapy (Signed)
[place], [place], [address] Phone: [phone]   Fax:  [phone]  Occupational Therapy Treatment  Patient Details  Name: Joyce [name] MRN: [id number] Date of Birth: March 08, 1976 Referring Provider (OT): Dr. Alger Simons   Encounter Date: 06/26/2020   OT End of Session - 06/26/20 1441    Visit Number 2    Number of Visits 12    Date for OT Re-Evaluation 08/05/20    Authorization Type [employer]    Authorization Time Period requesting 12 visits from 06/24/20-08/05/20    Authorization - Visit Number 1    Authorization - Number of Visits 12    Progress Note Due on Visit 10    OT Start Time 0932    OT Stop Time 6712   arrived at 1305   OT Time Calculation (min) 37 min    Activity Tolerance Patient tolerated treatment well    Behavior During Therapy [facility] for tasks assessed/performed           Past Medical History:  Diagnosis Date  . Acute CVA (cerebrovascular accident) ([place]) 03/04/2020  . Acute CVA (cerebrovascular accident) ([place]) 03/04/2020  . Acute left-sided weakness 03/05/2020  . AMA (advanced maternal age) multigravida 35+ 11/12/2014  . Anemia   . Depression   . [place] multiparity 11/26/2014  . Hypertension   . Migraine   . Pregnant 11/12/2014  . PTSD (post-traumatic stress disorder) 05/01/2017  . Round ligament pain 11/12/2014  . Short of breath on exertion 11/12/2014  . Trichomonas infection     Past Surgical History:  Procedure Laterality Date  . CHOLECYSTECTOMY      There were no vitals filed for this visit.   Subjective Assessment - 06/26/20 1321    Subjective  S:  I am doing the exercises at home.  they are going pretty well.    Currently in Pain? Yes    Pain Score 5     Pain Location Back    Pain Orientation Left    Pain Descriptors / Indicators Aching              [facility] OT Assessment - 06/26/20 0001      Assessment   Medical Diagnosis Left Hemiplegia    Referring Provider  (OT) Dr. Alger Simons      Precautions   Precautions Fall                    OT Treatments/Exercises (OP) - 06/26/20 0001      Exercises   Exercises Shoulder;Wrist;Hand;Theraputty;Elbow      Shoulder Exercises: ROM/Strengthening   Proximal Shoulder Strengthening, Seated completed vertical, horizontal, circle in, circle out, writing first and last name, each exercise 10 times with rest between each set, good form, good speed and distance coordination bilaterlly      Additional Elbow Exercises   Sponges for grip and in hand manipulation:  9,8, 10  then picked up handfuls of sponges and reached into varying degrees of flexion, abduction to approximatley 90 degrees to place in box x 5 attempts    Theraputty - Flatten yellow in seated position, wrist stability pull with pvc pipe 5 times    Theraputty - Roll yellow in seated position    Theraputty - Grip yellow supinated and pronated grasp      Theraputty   Theraputty - Pinch locate 10 beads with left hand    Theraputty Hand- Locate Pegs m  OT Short Term Goals - 06/26/20 1445      OT SHORT TERM GOAL #1   Title Patient will be educated and independent with HEP for improved functional use of LUE.    Time 3    Period Weeks    Status On-going    Target Date 07/15/20      OT SHORT TERM GOAL #2   Title Patient will improve LUE A/ROM to [facility] in order to use LUE as an active assist with daily tasks.    Time 3    Period Weeks    Status On-going      OT SHORT TERM GOAL #3   Title Patient will improve LUE strength to 4/5 or better for increased ability to reach into overhead cabinet.    Time 3    Period Weeks    Status On-going             OT Long Term Goals - 06/26/20 1444      OT LONG TERM GOAL #1   Title Patient will complete B/IADLs, leisure tasks at highest level of I possible.    Time 6    Period Weeks    Status On-going      OT LONG TERM GOAL #2   Title Patient will improve  left upper extremity grip strength to 15# or better and pinch strength to 7# or better for increased independence in opening containers.    Time 6    Period Weeks    Status On-going      OT LONG TERM GOAL #3   Title Patient will decrease completion time on nine hole peg test to 30" or less with left hand for improved coordination needed to tie shoes, braid hair, manipulate change.    Time 6    Period Weeks    Status On-going      OT LONG TERM GOAL #4   Title Patient will improve left hand sensation to [facility] with decreased pins and needles sensation.    Time 6    Period Weeks    Status On-going                 Plan - 06/26/20 1442    Clinical Impression Statement A:  patient reports compliance with HEP.  completed proximal shoulder strengthening this date, followed by tasks for improvement of grip strengh, pinch strength, and fine motor coordination.  patient's left arm begins tremoring with fatigue with reaching and proximal shoulder strengthening tasks.    Body Structure / Function / Physical Skills ADL;Strength;Dexterity;Pain;UE functional use;IADL;ROM;Sensation;Coordination;FMC    OT Treatment/Interventions Self-care/ADL training;Therapeutic exercise;Patient/family education;Neuromuscular education;Splinting;Moist Heat;Energy conservation;Therapeutic activities;Passive range of motion;Manual Therapy;DME and/or AE instruction;Ultrasound;Cryotherapy    Plan P:  increased to red tputty for improved grip and pinch strengthening.  add manual therapy and stretching to left shoulder  in supine.           Patient will benefit from skilled therapeutic intervention in order to improve the following deficits and impairments:   Body Structure / Function / Physical Skills: ADL,Strength,Dexterity,Pain,UE functional use,IADL,ROM,Sensation,Coordination,FMC       Visit Diagnosis: Other lack of coordination  Other symptoms and signs involving the nervous system  Stiffness of left  shoulder, not elsewhere classified    Problem List Patient Active Problem List   Diagnosis Date Noted  . Abdominal pain 06/19/2020  . History of CVA with residual deficit 05/15/2020  . Neuropathic pain 05/06/2020  . Encounter for IUD insertion 04/20/2020  . Anxiety state   .  Dyslipidemia   . Labile blood pressure   . Migraine without status migrainosus, not intractable   . Right thalamic infarction ([place]) 03/10/2020  . Left hemiparesis ([place]) 03/10/2020  . Recurrent falls 03/05/2020  . MDD (major depressive disorder), recurrent episode, moderate ([place]) 05/01/2017  . PTSD (post-traumatic stress disorder) 05/01/2017  . Depression, recurrent ([place]) 12/07/2016  . Essential hypertension 07/20/2016  . Uterine fibroid 01/27/2016  . Cervical high risk HPV (human papillomavirus) test positive 12/08/2014    Vangie Bicker, [place], OTR/L [phone]  06/26/2020, 2:46 PM  [facility] [address] [place], [place], [address] Phone: [phone]   Fax:  [phone]  Name: Joyce [name] MRN: [id number] Date of Birth: [birth year]

## 2020-06-30 ENCOUNTER — Other Ambulatory Visit: Payer: Self-pay

## 2020-06-30 ENCOUNTER — Encounter (HOSPITAL_COMMUNITY): Payer: Self-pay | Admitting: Physical Therapy

## 2020-06-30 ENCOUNTER — Encounter (HOSPITAL_COMMUNITY): Payer: Self-pay | Admitting: Occupational Therapy

## 2020-06-30 ENCOUNTER — Ambulatory Visit (HOSPITAL_COMMUNITY): Payer: Medicaid Other | Admitting: Occupational Therapy

## 2020-06-30 ENCOUNTER — Ambulatory Visit (HOSPITAL_COMMUNITY): Payer: Medicaid Other | Attending: Physical Medicine & Rehabilitation | Admitting: Physical Therapy

## 2020-06-30 DIAGNOSIS — R262 Difficulty in walking, not elsewhere classified: Secondary | ICD-10-CM | POA: Diagnosis not present

## 2020-06-30 DIAGNOSIS — M25612 Stiffness of left shoulder, not elsewhere classified: Secondary | ICD-10-CM

## 2020-06-30 DIAGNOSIS — R29818 Other symptoms and signs involving the nervous system: Secondary | ICD-10-CM | POA: Diagnosis not present

## 2020-06-30 DIAGNOSIS — M6281 Muscle weakness (generalized): Secondary | ICD-10-CM | POA: Diagnosis not present

## 2020-06-30 DIAGNOSIS — R278 Other lack of coordination: Secondary | ICD-10-CM | POA: Insufficient documentation

## 2020-06-30 NOTE — Therapy (Signed)
[place], [place], [address] Phone: [phone]   Fax:  [phone]  Occupational Therapy Treatment  Patient Details  Name: Joyce [name] MRN: [id number] Date of Birth: 1975/08/24 Referring Provider (OT): Dr. Alger Simons   Encounter Date: 06/30/2020   OT End of Session - 06/30/20 1551    Visit Number 3    Number of Visits 12    Date for OT Re-Evaluation 08/05/20    Authorization Type [employer]    Authorization Time Period requesting 12 visits from 06/24/20-08/05/20    Authorization - Visit Number 2    Authorization - Number of Visits 12    Progress Note Due on Visit 10    OT Start Time 1350    OT Stop Time 1429    OT Time Calculation (min) 39 min    Activity Tolerance Patient tolerated treatment well    Behavior During Therapy [facility] for tasks assessed/performed           Past Medical History:  Diagnosis Date  . Acute CVA (cerebrovascular accident) ([place]) 03/04/2020  . Acute CVA (cerebrovascular accident) ([place]) 03/04/2020  . Acute left-sided weakness 03/05/2020  . AMA (advanced maternal age) multigravida 35+ 11/12/2014  . Anemia   . Depression   . [place] multiparity 11/26/2014  . Hypertension   . Migraine   . Pregnant 11/12/2014  . PTSD (post-traumatic stress disorder) 05/01/2017  . Round ligament pain 11/12/2014  . Short of breath on exertion 11/12/2014  . Trichomonas infection     Past Surgical History:  Procedure Laterality Date  . CHOLECYSTECTOMY      There were no vitals filed for this visit.   Subjective Assessment - 06/30/20 1348    Subjective  S: Everything gets harder when my back starts hurting.    Pain Score 7     Pain Location Back    Pain Orientation Mid;Lower    Pain Descriptors / Indicators Aching    Pain Type Chronic pain              [facility] OT Assessment - 06/30/20 1538      Assessment   Medical Diagnosis Left Hemiplegia      Precautions   Precautions Fall                     OT Treatments/Exercises (OP) - 06/30/20 0001      Shoulder Exercises: Supine   Horizontal ABduction 5 reps;AAROM   vc for form; lowering beyone 90 flexion   External Rotation AAROM;5 reps   sharp pain through bicep   Internal Rotation AAROM;5 reps    Flexion PROM;AAROM;5 reps   pain biceps; tingly upon return to neutral; tremoring w/ tone for one rep of AROM; x5 of PROM but discontinued due to pain and tone.   ABduction AAROM;5 reps      Shoulder Exercises: Standing   Other Standing Exercises paddle, x, circles, x10 for proximal L shoulder strengthening   fatigue at 5 or 6 on circles and x patterns     Neurological Re-education Exercises   Theraputty - Flatten red in seated position, wrist stability pull with pvc pipe 5 times; minimal to moderate difficulty   moderate assist to flatten; noted tremors   Theraputty - Roll red in seated position    Theraputty - Grip red supinated and pronated grasp    Theraputty - Pinch red seated position      Manual Therapy   Manual  Therapy Myofascial release    Manual therapy comments completed separately form therapeutic exercise   anterior bicpes   Myofascial Release Myofascial release and manual techniques to L anterior and shoulder to decrease pain and fascial restrictions and to improve joint ROM.                    OT Short Term Goals - 06/26/20 1445      OT SHORT TERM GOAL #1   Title Patient will be educated and independent with HEP for improved functional use of LUE.    Time 3    Period Weeks    Status On-going    Target Date 07/15/20      OT SHORT TERM GOAL #2   Title Patient will improve LUE A/ROM to [facility] in order to use LUE as an active assist with daily tasks.    Time 3    Period Weeks    Status On-going      OT SHORT TERM GOAL #3   Title Patient will improve LUE strength to 4/5 or better for increased ability to reach into overhead cabinet.    Time 3    Period Weeks    Status On-going              OT Long Term Goals - 06/26/20 1444      OT LONG TERM GOAL #1   Title Patient will complete B/IADLs, leisure tasks at highest level of I possible.    Time 6    Period Weeks    Status On-going      OT LONG TERM GOAL #2   Title Patient will improve left upper extremity grip strength to 15# or better and pinch strength to 7# or better for increased independence in opening containers.    Time 6    Period Weeks    Status On-going      OT LONG TERM GOAL #3   Title Patient will decrease completion time on nine hole peg test to 30" or less with left hand for improved coordination needed to tie shoes, braid hair, manipulate change.    Time 6    Period Weeks    Status On-going      OT LONG TERM GOAL #4   Title Patient will improve left hand sensation to [facility] with decreased pins and needles sensation.    Time 6    Period Weeks    Status On-going                 Plan - 06/30/20 1546    Clinical Impression Statement A: Pt reported to have mid to low back pain this date. During supine PROM and A/AROM pt appeared to be in pain resulting in extended time to complete ROM tasks. PROM discontinued this date in favor of A/AROM with dowel rod with less observation of limiting pain. Pt fatigued at 5 to 6 reps for parts of proximal shoulder strengthening. Pt Able to maniuplate red putty this date per plan from last week.    Occupational performance deficits (Please refer to evaluation for details): ADL's;IADL's;Work;Leisure    Body Structure / Function / Physical Skills ADL;Strength;Dexterity;Pain;UE functional use;IADL;ROM;Sensation;Coordination;FMC    Plan P: continue A/AROM in supine to start session; myofascial release to anterior/superior L shoulder as needed; attempt standing A/AROM as possible with dowel rod. Continue pinch and grip strengthening tasks.    OT Home Exercise Plan 1/26:  table slides and [facility] training    Consulted and Agree  with Plan of Care Patient            Patient will benefit from skilled therapeutic intervention in order to improve the following deficits and impairments:   Body Structure / Function / Physical Skills: ADL,Strength,Dexterity,Pain,UE functional use,IADL,ROM,Sensation,Coordination,FMC       Visit Diagnosis: Muscle weakness (generalized)  Other symptoms and signs involving the nervous system  Stiffness of left shoulder, not elsewhere classified    Problem List Patient Active Problem List   Diagnosis Date Noted  . Abdominal pain 06/19/2020  . History of CVA with residual deficit 05/15/2020  . Neuropathic pain 05/06/2020  . Encounter for IUD insertion 04/20/2020  . Anxiety state   . Dyslipidemia   . Labile blood pressure   . Migraine without status migrainosus, not intractable   . Right thalamic infarction ([place]) 03/10/2020  . Left hemiparesis ([place]) 03/10/2020  . Recurrent falls 03/05/2020  . MDD (major depressive disorder), recurrent episode, moderate ([place]) 05/01/2017  . PTSD (post-traumatic stress disorder) 05/01/2017  . Depression, recurrent ([place]) 12/07/2016  . Essential hypertension 07/20/2016  . Uterine fibroid 01/27/2016  . Cervical high risk HPV (human papillomavirus) test positive 12/08/2014   Larey Seat OT, MOT  Larey Seat 06/30/2020, 3:56 PM  [facility] [address] [place], [place], [address] Phone: [phone]   Fax:  [phone]  Name: Joyce [name] MRN: [id number] Date of Birth: [birth year]

## 2020-06-30 NOTE — Therapy (Signed)
[place], [place], [address] Phone: [phone]   Fax:  [phone]  Physical Therapy Treatment  Patient Details  Name: Joyce [name] MRN: [id number] Date of Birth: 30-Nov-1975 Referring Provider (PT): Lauraine Rinne PA   Encounter Date: 06/30/2020   PT End of Session - 06/30/20 1445    Visit Number 10    Number of Visits 17    Date for PT Re-Evaluation 07/29/20    Authorization Type self -pay, [employer]-approved 8 visits approved from 06/12/20 to 07/29/20    Authorization - Visit Number 1    Authorization - Number of Visits 8    Progress Note Due on Visit 19    PT Start Time 8756    PT Stop Time 1523    PT Time Calculation (min) 38 min    Equipment Utilized During Treatment Gait belt    Activity Tolerance Patient limited by fatigue    Behavior During Therapy [facility] for tasks assessed/performed           Past Medical History:  Diagnosis Date  . Acute CVA (cerebrovascular accident) ([place]) 03/04/2020  . Acute CVA (cerebrovascular accident) ([place]) 03/04/2020  . Acute left-sided weakness 03/05/2020  . AMA (advanced maternal age) multigravida 35+ 11/12/2014  . Anemia   . Depression   . [place] multiparity 11/26/2014  . Hypertension   . Migraine   . Pregnant 11/12/2014  . PTSD (post-traumatic stress disorder) 05/01/2017  . Round ligament pain 11/12/2014  . Short of breath on exertion 11/12/2014  . Trichomonas infection     Past Surgical History:  Procedure Laterality Date  . CHOLECYSTECTOMY      There were no vitals filed for this visit.   Subjective Assessment - 06/30/20 1454    Subjective Pain in back noted at 4/10 but took pain meds recently. States she is tired because she just had OT. States her exercises are going well with no difficulties.    Pertinent History HTN, Migraine, PTSD, Acute CVA 03/04/20    How long can you walk comfortably? 5-10 with rollator    Currently in Pain? Yes    Pain Score 4      Pain Location Back    Pain Orientation Lower;Mid    Pain Descriptors / Indicators Aching;Sharp    Pain Type Chronic pain              [facility] PT Assessment - 06/30/20 0001      Assessment   Medical Diagnosis Left Hemiplegia                         [facility] Adult PT Treatment/Exercise - 06/30/20 1453      Ambulation/Gait   Gait Comments walking in // bars with 3# ankle weights on and UE assist x6 laps forwards and backwards, then lateral x5 B      Knee/Hip Exercises: Standing   Other Standing Knee Exercises toe walk in // bars with UE assist (mini heel raise) x2    Other Standing Knee Exercises foot taps on 12" step with bilateral UE support 3x5 B - alternating                    PT Short Term Goals - 06/03/20 1331      PT SHORT TERM GOAL #1   Title Patient will report at least 25% improvement in overall symptoms and/or function to demonstrate improved functional mobility  Time 4    Period Weeks    Status Achieved    Target Date 04/29/20      PT SHORT TERM GOAL #2   Title Patient will be independent in self management strategies to improve quality of life and functional outcomes.    Time 4    Period Weeks    Status On-going    Target Date 04/29/20      PT SHORT TERM GOAL #3   Title Patient will be able to transition from sit to stand without use of upper extremities to improve transitional mobility    Baseline left hand on thigh to perform, slow and labored movement    Time 4    Period Weeks    Status Partially Met    Target Date 04/29/20             PT Long Term Goals - 06/03/20 1331      PT LONG TERM GOAL #1   Title Patient will report at least 50% improvement in overall symptoms and/or function to demonstrate improved functional mobility    Time 8    Period Weeks    Status On-going      PT LONG TERM GOAL #2   Title Patient will be able to ascend and descend stairs with use of railing and step through gait pattern to improve  ability to go up to bedroom    Baseline unable step through with heavy use of arms on the railing    Time 8    Period Weeks    Status On-going      PT LONG TERM GOAL #3   Title Patient will be able to ambulate at least 226 feet in 2 minutes without use of Assistive device to improve ability to walk in community    Time 8    Period Weeks    Status On-going                 Plan - 06/30/20 1454    Clinical Impression Statement Bilateral legs immediately began to shake with first set of exercises. Difficulty with patient clearing right foot with step taps on 12" step. Increased shaking noted on right lower extremity with left leg coming up onto step. With stepping with ankle weights patient able to take longer strides with right leg and more of a step to gait with the left leg. Patient continues to fatigue easily and requires long rest breaks to regain her strength. Will continue with current POC as tolerated.    Personal Factors and Comorbidities Comorbidity 1;Comorbidity 2    Comorbidities migraines, HTN    Examination-Activity Limitations Locomotion Level;Lift;Transfers;Toileting;Stand;Stairs;Squat;Carry;Bathing    Examination-Participation Restrictions Cleaning;Community Activity;Meal Prep;Shop;Occupation    Stability/Clinical Decision Making Stable/Uncomplicated    Rehab Potential Good    PT Frequency --   1-2x/week for total of 12 visits over 8 week certification period   PT Duration 8 weeks    PT Treatment/Interventions ADLs/Self Care Home Management;Aquatic Therapy;Cryotherapy;Electrical Stimulation;Moist Heat;Traction;Balance training;Therapeutic exercise;Therapeutic activities;Functional mobility training;Stair training;Gait training;DME Instruction;Neuromuscular re-education;Patient/family education;Orthotic Fit/Training;Manual techniques;Energy conservation;Dry needling;Passive range of motion    PT Next Visit Plan LE strengthening.  focus on muscle activation, walking  mechanics, transfers without using UE to mobilize Lt LE, standing exercises.  Progress with open chain PRE and dynamic standing activities    PT Home Exercise Plan LAQs, DF    Consulted and Agree with Plan of Care Patient           Patient will benefit  from skilled therapeutic intervention in order to improve the following deficits and impairments:  Decreased coordination,Improper body mechanics,Decreased range of motion,Decreased balance,Decreased mobility,Difficulty walking,Pain,Decreased strength,Decreased activity tolerance,Decreased knowledge of use of DME,Abnormal gait,Decreased endurance  Visit Diagnosis: Difficulty in walking, not elsewhere classified  Muscle weakness (generalized)  Other symptoms and signs involving the nervous system     Problem List Patient Active Problem List   Diagnosis Date Noted  . Abdominal pain 06/19/2020  . History of CVA with residual deficit 05/15/2020  . Neuropathic pain 05/06/2020  . Encounter for IUD insertion 04/20/2020  . Anxiety state   . Dyslipidemia   . Labile blood pressure   . Migraine without status migrainosus, not intractable   . Right thalamic infarction ([place]) 03/10/2020  . Left hemiparesis ([place]) 03/10/2020  . Recurrent falls 03/05/2020  . MDD (major depressive disorder), recurrent episode, moderate ([place]) 05/01/2017  . PTSD (post-traumatic stress disorder) 05/01/2017  . Depression, recurrent ([place]) 12/07/2016  . Essential hypertension 07/20/2016  . Uterine fibroid 01/27/2016  . Cervical high risk HPV (human papillomavirus) test positive 12/08/2014   3:24 PM, 06/30/20 Jerene Pitch, DPT Physical Therapy with [facility]  [phone] office  [place] [address] [place], [place], [address] Phone: [phone]   Fax:  [phone]  Name: Joyce [name] MRN: [id number] Date of Birth: Oct 05, 1975

## 2020-07-01 ENCOUNTER — Ambulatory Visit: Payer: Medicaid Other | Admitting: Nurse Practitioner

## 2020-07-01 ENCOUNTER — Encounter: Payer: Self-pay | Admitting: Physical Medicine & Rehabilitation

## 2020-07-01 ENCOUNTER — Encounter: Payer: Medicaid Other | Attending: Physical Medicine & Rehabilitation | Admitting: Physical Medicine & Rehabilitation

## 2020-07-01 VITALS — BP 146/81 | HR 81 | Temp 98.5°F | Ht 65.0 in | Wt 236.2 lb

## 2020-07-01 DIAGNOSIS — M792 Neuralgia and neuritis, unspecified: Secondary | ICD-10-CM | POA: Diagnosis not present

## 2020-07-01 DIAGNOSIS — G8929 Other chronic pain: Secondary | ICD-10-CM | POA: Insufficient documentation

## 2020-07-01 DIAGNOSIS — M545 Low back pain, unspecified: Secondary | ICD-10-CM | POA: Diagnosis not present

## 2020-07-01 DIAGNOSIS — K219 Gastro-esophageal reflux disease without esophagitis: Secondary | ICD-10-CM | POA: Insufficient documentation

## 2020-07-01 DIAGNOSIS — I639 Cerebral infarction, unspecified: Secondary | ICD-10-CM | POA: Insufficient documentation

## 2020-07-01 DIAGNOSIS — I6381 Other cerebral infarction due to occlusion or stenosis of small artery: Secondary | ICD-10-CM

## 2020-07-01 MED ORDER — PANTOPRAZOLE SODIUM 40 MG PO TBEC: 40.0000 mg | DELAYED_RELEASE_TABLET | Freq: Every day | ORAL | 1 refills | Status: DC

## 2020-07-01 MED ORDER — METHOCARBAMOL 500 MG PO TABS
500.0000 mg | ORAL_TABLET | Freq: Three times a day (TID) | ORAL | 1 refills | Status: DC | PRN
Start: 2020-07-01 — End: 2020-09-02

## 2020-07-01 NOTE — Patient Instructions (Addendum)
STOP IBUPROFEN  WORK ON REGULAR BACK STRETCHING WHILE SEATED (FORWARD BEND, SIDE BEND, ROTATION). SPEAK WITH YOUR PHYSICAL THERAPIST ABOUT YOUR BACK PAIN AS WELL.   USE REGULAR HEAT ON YOUR BACK. AT LEAST 3 X DAILY. STRETCH AFTER YOU HEAT

## 2020-07-01 NOTE — Progress Notes (Addendum)
Subjective:    Patient ID: Joyce [name], female    DOB: 1975-08-29, 45 y.o.   MRN: [id number]  HPI  Joyce [name] is here in follow up of her right thalamic infarct with left HP. She has had delays with therapy d/t coming down with COVID in early January. She had significant flu-like symptoms. She has been dealing with increased low back pain after the COVID episode. She says that the pain has really been worse in her low back since the stroke. The pain is in her left low back with radiation to the right side at times. It's worst when shes up walking or if she sits too long.   She reports that her headaches are much better and that the neuropathic pain in her left leg is much better with the increased gabapentin as long as she takes her medication when she's supposed to.   She is using he RW. She hasn't had any falls. She's sleeping well. Mood is positive.   The patient reports increased indigestion and stomach upset since having COVID. She was using quite a bit of ibuprofen in addition to the plavix and ECASA she uses for stroke prophylaxis.   Pain Inventory Average Pain 9 Pain Right Now 9 My pain is sharp, stabbing, tingling and aching  In the last 24 hours, has pain interfered with the following? General activity 9 Relation with others 9 Enjoyment of life 9 What TIME of day is your pain at its worst? morning , daytime, evening and night Sleep (in general) Poor  Pain is worse with: walking, sitting, standing and some activites Pain improves with: therapy/exercise and medication Relief from Meds: 5  Family History  Problem Relation Age of Onset  . Diabetes Mother   . Hypertension Mother   . Kidney disease Mother   . Asthma Mother   . Hyperlipidemia Mother   . Alzheimer's disease Father   . Diabetes Father   . Kidney disease Sister   . Asthma Son   . Diabetes Sister   . Asthma Son   . ADD / ADHD Son   . Heart murmur Son   . Asthma Son    Social History    Socioeconomic History  . Marital status: Significant Other    Spouse name: Joyce [name]  . Number of children: 8  . Years of education: 46  . Highest education level: Not on file  Occupational History  . Occupation: unemployed    Comment: disabled  Tobacco Use  . Smoking status: Never Smoker  . Smokeless tobacco: Never Used  Vaping Use  . Vaping Use: Never used  Substance and Sexual Activity  . Alcohol use: Not Currently    Comment: occasionally  . Drug use: No  . Sexual activity: Yes    Birth control/protection: I.U.D.    Comment: followed by GYN  Other Topics Concern  . Not on file  Social History Narrative   Lives with 5 children   Joyce [name], his mother   Fiancee's two children   Social Determinants of Health   Financial Resource Strain: Not on file  Food Insecurity: Not on file  Transportation Needs: Not on file  Physical Activity: Not on file  Stress: Not on file  Social Connections: Not on file   Past Surgical History:  Procedure Laterality Date  . CHOLECYSTECTOMY     Past Surgical History:  Procedure Laterality Date  . CHOLECYSTECTOMY     Past Medical History:  Diagnosis Date  . Acute CVA (  cerebrovascular accident) ([place]) 03/04/2020  . Acute CVA (cerebrovascular accident) ([place]) 03/04/2020  . Acute left-sided weakness 03/05/2020  . AMA (advanced maternal age) multigravida 35+ 11/12/2014  . Anemia   . Depression   . [place] multiparity 11/26/2014  . Hypertension   . Migraine   . Pregnant 11/12/2014  . PTSD (post-traumatic stress disorder) 05/01/2017  . Round ligament pain 11/12/2014  . Short of breath on exertion 11/12/2014  . Trichomonas infection    BP (!) 146/81   Pulse 81   Temp 98.5 F (36.9 C)   Ht 5\' 5"  (1.651 m)   Wt 236 lb 3.2 oz (107.1 kg)   SpO2 98%   BMI 39.31 kg/m   Opioid Risk Score:   Fall Risk Score:  `1  Depression screen PHQ 2/9  Depression screen [facility] 2/9 06/19/2020 05/15/2020 05/06/2020 04/03/2020 12/01/2017 02/15/2017 12/07/2016   Decreased Interest 1 1 1 2  0 0 0  Down, Depressed, Hopeless 1 1 1 3 2  0 0  PHQ - 2 Score 2 2 2 5 2  0 0  Altered sleeping 1 1 - 3 2 - -  Tired, decreased energy 2 1 - 2 3 - -  Change in appetite 1 1 - 2 0 - -  Feeling bad or failure about yourself  1 1 - 3 0 - -  Trouble concentrating 1 1 - 3 2 - -  Moving slowly or fidgety/restless 1 0 - 1 0 - -  Suicidal thoughts 0 0 - 0 0 - -  PHQ-9 Score 9 7 - 19 9 - -  Difficult doing work/chores Somewhat difficult Somewhat difficult - - Somewhat difficult - -  Some recent data might be hidden    Review of Systems  Musculoskeletal: Positive for back pain.  All other systems reviewed and are negative.      Objective:   Physical Exam  General: No acute distress. obese HEENT: EOMI, oral membranes moist Cards: reg rate  Chest: normal effort Abdomen: Soft, NT, ND Skin: dry, intact Extremities: no edema Psych: pleasant and appropriate Neuro: Patient is alert and oriented x3. She demonstrates reasonable insight and awareness. Cranial nerve exam is grossly intact except for decreased visual acuity. Gaze is conjugate although she does report blurred vision. Strength is 5 out of 5 right upper and 4/5 right lower extremities. 3-4/5 LLE d/t pain.  Sensation is 1 out of 2 left upper left lower extremity with decreased fine motor coordination. She has a positive left pronator drift still.  She ambulates with her rolling walker and struggles to keep weight balanced to left.  Musculoskeletal: LB tender to palpation with spasms noted in high lumbar to lower thoracic paraspinals, L>R. She struggled with forward flexion or lateral bending. She needeed extra time for sit to stand.               Assessment & Plan:  1.  Left-sided hemiparesis secondary to right thalamic infarction             -Secondary stroke prophylaxis with Plavix 75 mg daily and aspirin 325 mg daily             -continue with outpt therapies at OT/PT             -RW for safety 2.   Pain management/history of migraines             -continue on topiramate 25 mg daily. This is working nicely for her             -  Gabapentin for neuropathic pain 300 mg 3 times daily                         -Increased to 300 mg twice daily and 600 mg nightly with improvement   -needs to be timely with taking meds.  3.  Mood: Lexapro 20 mg daily             -Alprazolam 0.25 mg daily as needed             -Trazodone 100 mg nightly for sleep.   4.  Spasticity: controlled Baclofen 5 mg 3 times daily----DC  5. GERD- protonix    -stop nsaids other than ECASA   -GI f/u arranged.  6. Low back pain- likely d/t gait mechanics, mild spondylosis, spasm  -xrays  -robaxin trial  -heat  -d/w therapy re: stretches.    Fifteen minutes of face to face patient care time were spent during this visit. All questions were encouraged and answered.  Follow up with me in 6 weeks .

## 2020-07-02 ENCOUNTER — Telehealth (HOSPITAL_COMMUNITY): Payer: Self-pay | Admitting: Physical Therapy

## 2020-07-02 ENCOUNTER — Ambulatory Visit (HOSPITAL_COMMUNITY): Payer: Medicaid Other | Admitting: Physical Therapy

## 2020-07-02 ENCOUNTER — Ambulatory Visit (HOSPITAL_COMMUNITY): Payer: Medicaid Other | Admitting: Occupational Therapy

## 2020-07-02 NOTE — Telephone Encounter (Signed)
pt cancelled appt for today because her back is in pain.

## 2020-07-03 ENCOUNTER — Ambulatory Visit (HOSPITAL_COMMUNITY)
Admission: RE | Admit: 2020-07-03 | Discharge: 2020-07-03 | Disposition: A | Discharge status: Home / Self Care | Payer: Medicaid Other | Source: Ambulatory Visit | Attending: Nurse Practitioner | Admitting: Nurse Practitioner

## 2020-07-03 ENCOUNTER — Other Ambulatory Visit: Payer: Self-pay

## 2020-07-03 ENCOUNTER — Encounter (HOSPITAL_COMMUNITY): Payer: Medicaid Other

## 2020-07-03 ENCOUNTER — Ambulatory Visit (HOSPITAL_COMMUNITY)
Admission: RE | Admit: 2020-07-03 | Discharge: 2020-07-03 | Disposition: A | Discharge status: Home / Self Care | Payer: Medicaid Other | Source: Ambulatory Visit | Attending: Physical Medicine & Rehabilitation | Admitting: Physical Medicine & Rehabilitation

## 2020-07-03 DIAGNOSIS — M545 Low back pain, unspecified: Secondary | ICD-10-CM

## 2020-07-03 DIAGNOSIS — R1084 Generalized abdominal pain: Secondary | ICD-10-CM | POA: Diagnosis not present

## 2020-07-03 DIAGNOSIS — G8929 Other chronic pain: Secondary | ICD-10-CM

## 2020-07-03 DIAGNOSIS — M792 Neuralgia and neuritis, unspecified: Secondary | ICD-10-CM | POA: Diagnosis not present

## 2020-07-03 DIAGNOSIS — R109 Unspecified abdominal pain: Secondary | ICD-10-CM | POA: Diagnosis not present

## 2020-07-03 DIAGNOSIS — M47816 Spondylosis without myelopathy or radiculopathy, lumbar region: Secondary | ICD-10-CM | POA: Diagnosis not present

## 2020-07-03 IMAGING — US US ABDOMEN COMPLETE
1 series · 14 of 25 positions shown · non-contrast
Comparison: None.

CLINICAL DATA: Abdominal pain for 2 weeks.

EXAM:
ABDOMEN ULTRASOUND COMPLETE

[Series 1: us abdomen complete · 14 of 59 slices shown]
[im 1/59]
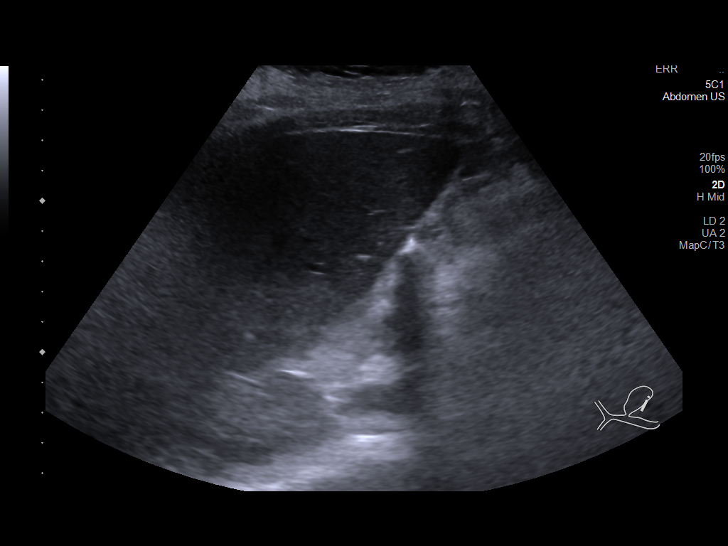
[im 5/59]
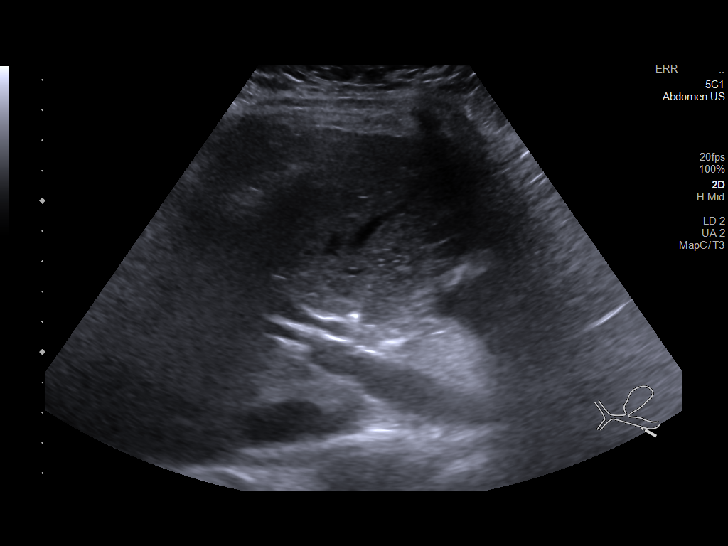
[im 10/59]
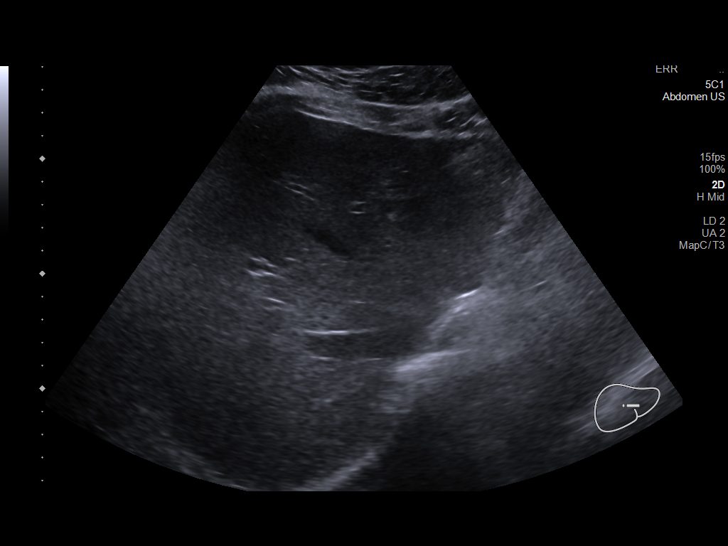
[im 15/59]
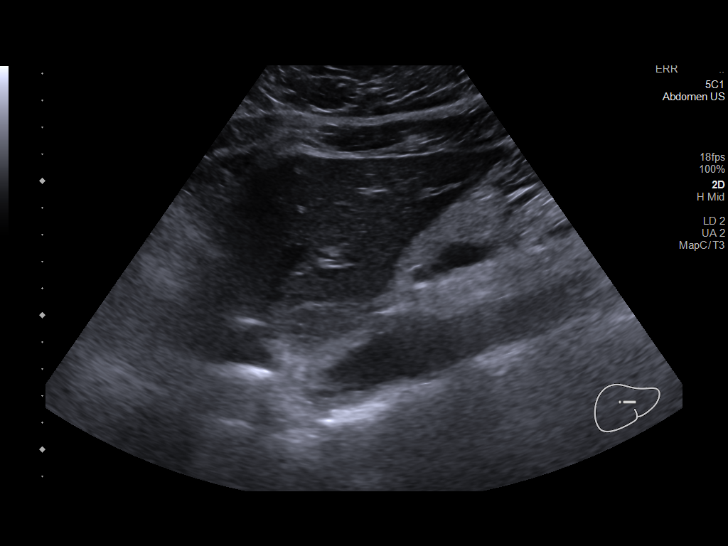
[im 20/59]
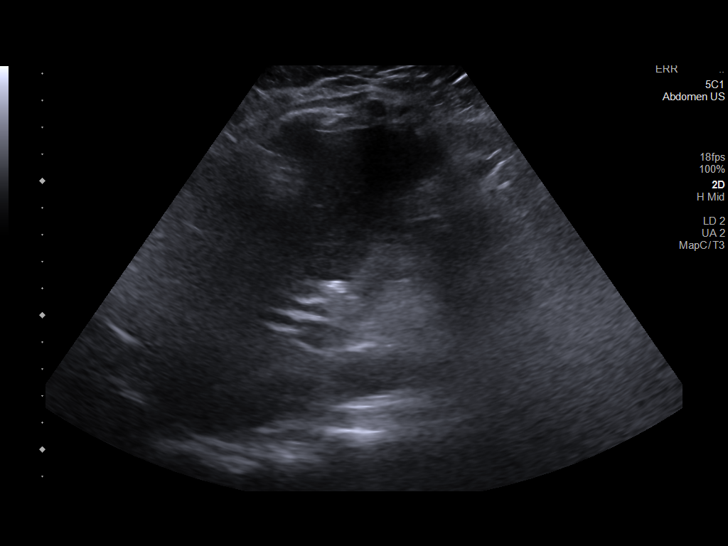
[im 22/59]
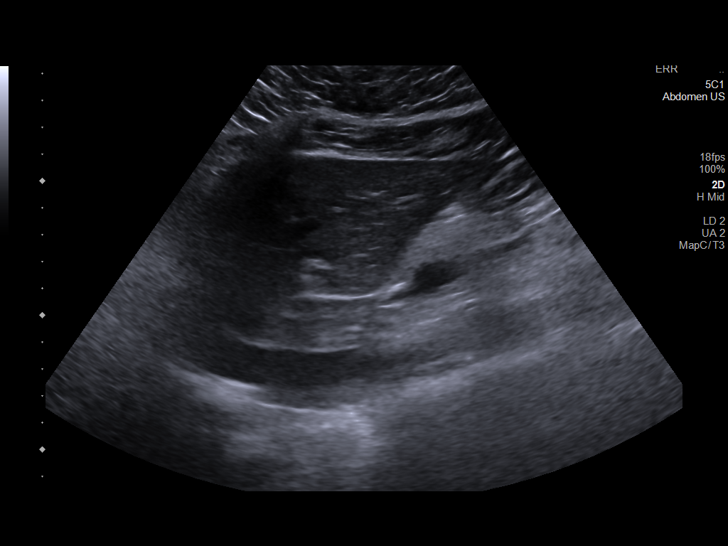
[im 27/59]
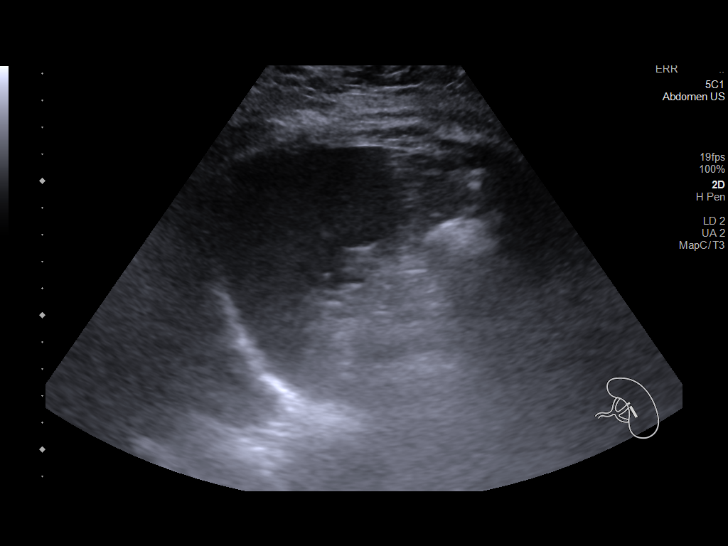
[im 32/59]
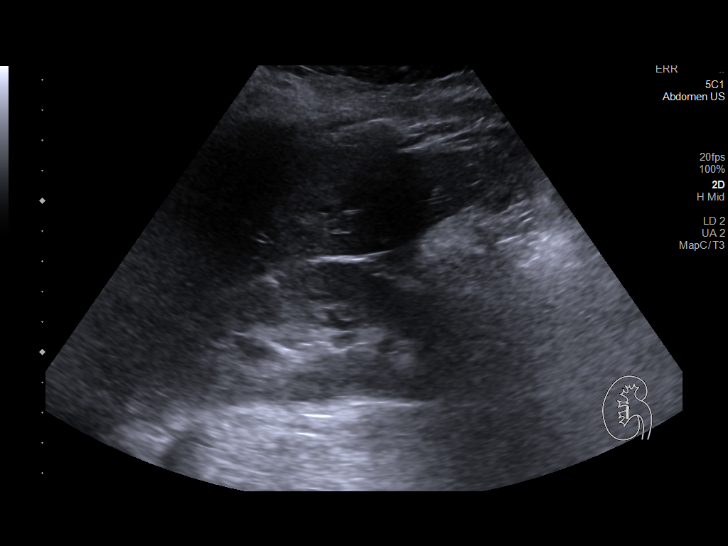
[im 37/59]
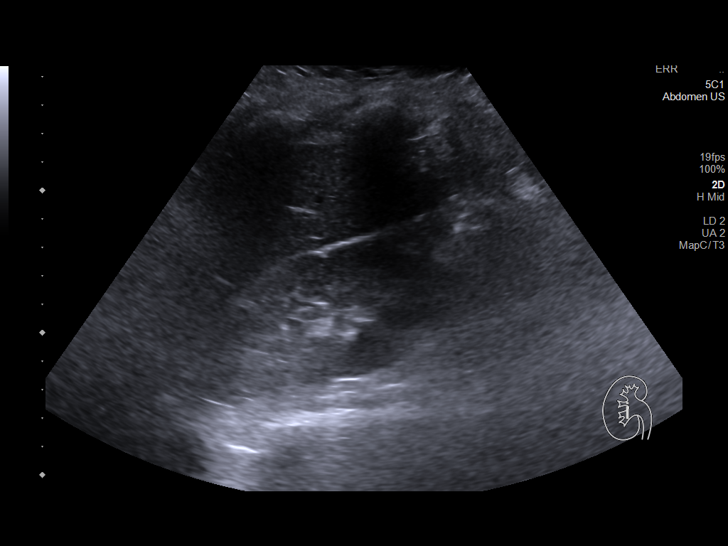
[im 39/59]
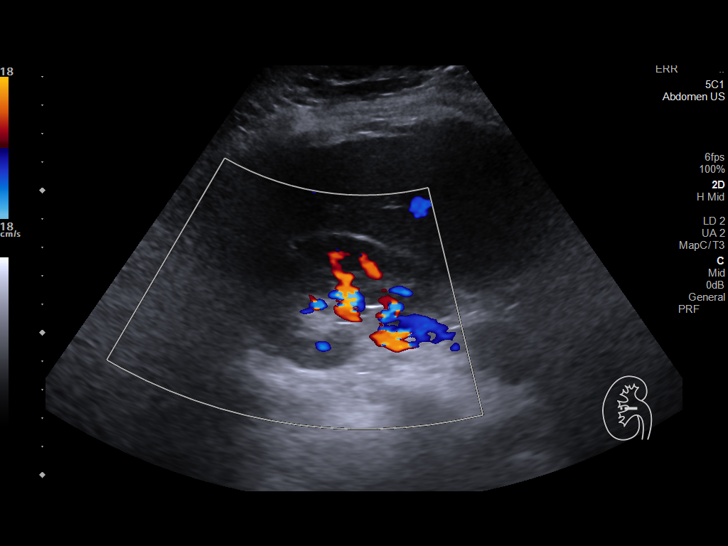
[im 44/59]
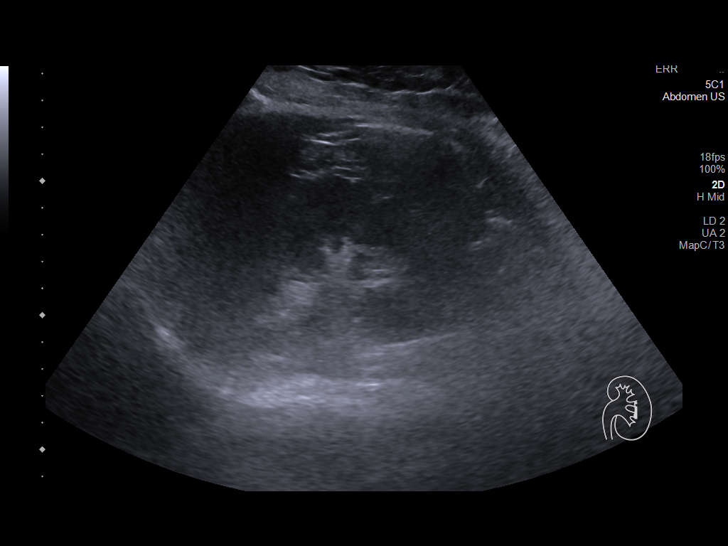
[im 49/59]
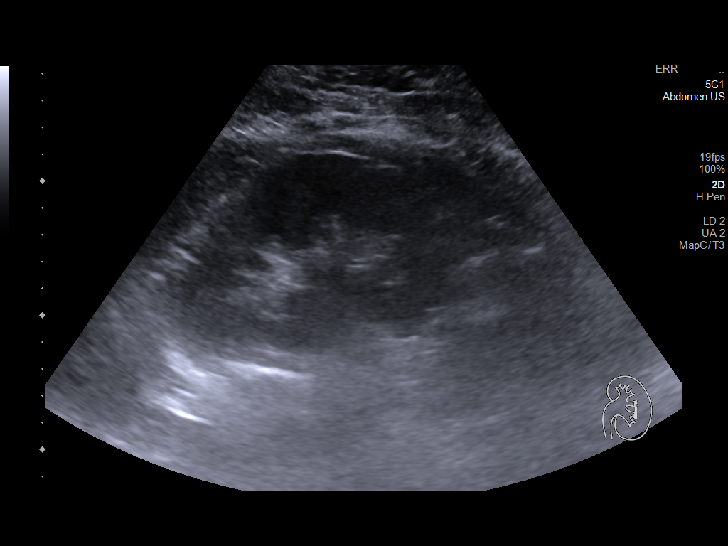
[im 54/59]
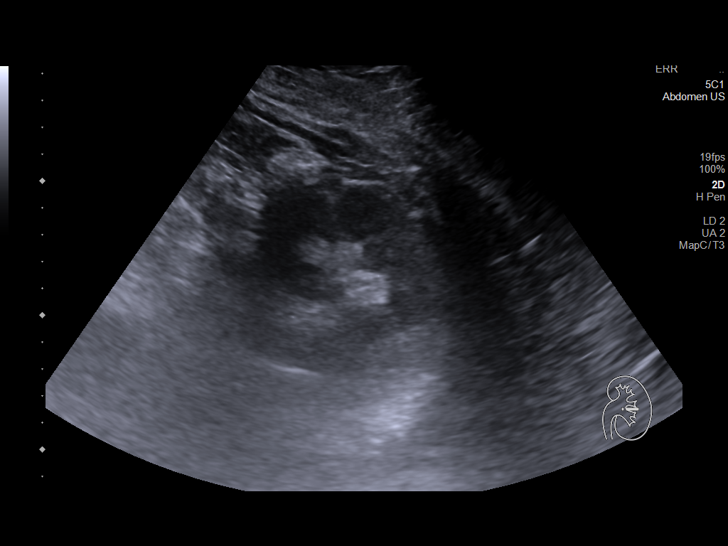
[im 59/59]
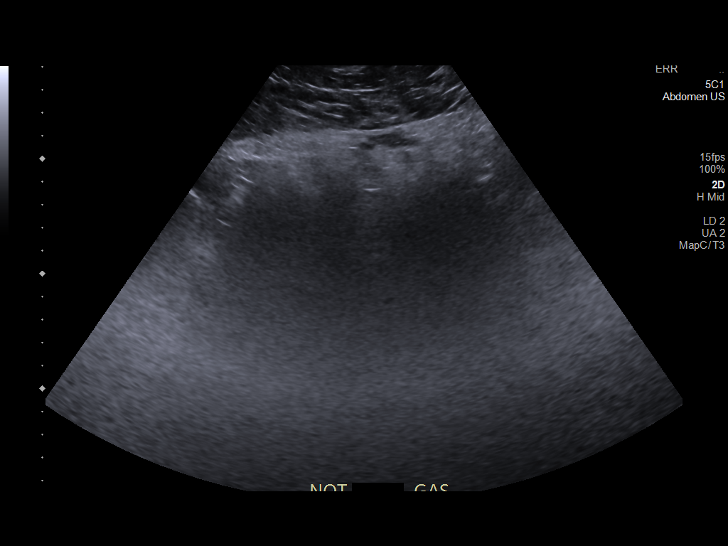

[14 of 25 positions shown; findings below may reference images not displayed]

FINDINGS: Gallbladder: Surgically absent.

Common bile duct: Diameter: 4.0 mm

Liver: Normal echogenicity without focal lesion or biliary
dilatation. Portal vein is patent on color Doppler imaging with
normal direction of blood flow towards the liver.

IVC: Normal caliber.

Pancreas: Sonographically unremarkable.

Spleen: Normal size.  No focal lesions.

Right Kidney: Length: 10.0 cm. Normal renal cortical thickness and
echogenicity without focal lesions or hydronephrosis.

Left Kidney: Length: 10.0 cm. Normal renal cortical thickness and
echogenicity without focal lesions or hydronephrosis.

Abdominal aorta: Normal caliber.

Other findings: None.
IMPRESSION: 1. Status post cholecystectomy.  No biliary dilatation.
2. Otherwise unremarkable abdominal ultrasound examination.

## 2020-07-03 IMAGING — DX DG LUMBAR SPINE COMPLETE 4+V
5 series · 5 of 5 positions shown · non-contrast
Comparison: None.

CLINICAL DATA: Low back pain

EXAM:
LUMBAR SPINE - COMPLETE 4+ VIEW

[l-spine ap]
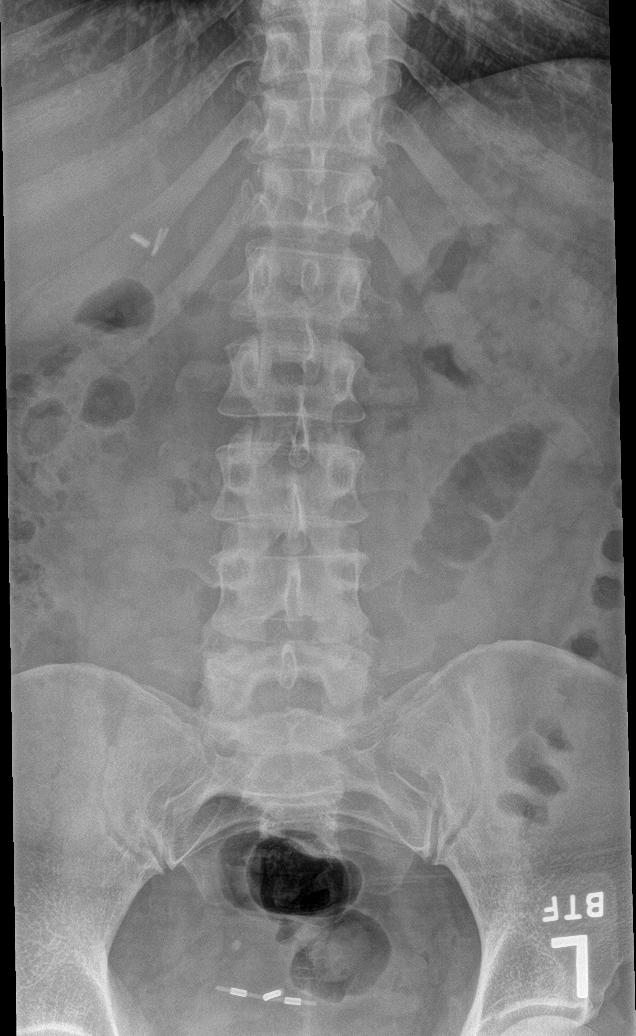

[l-spine obl (1 of 2)]
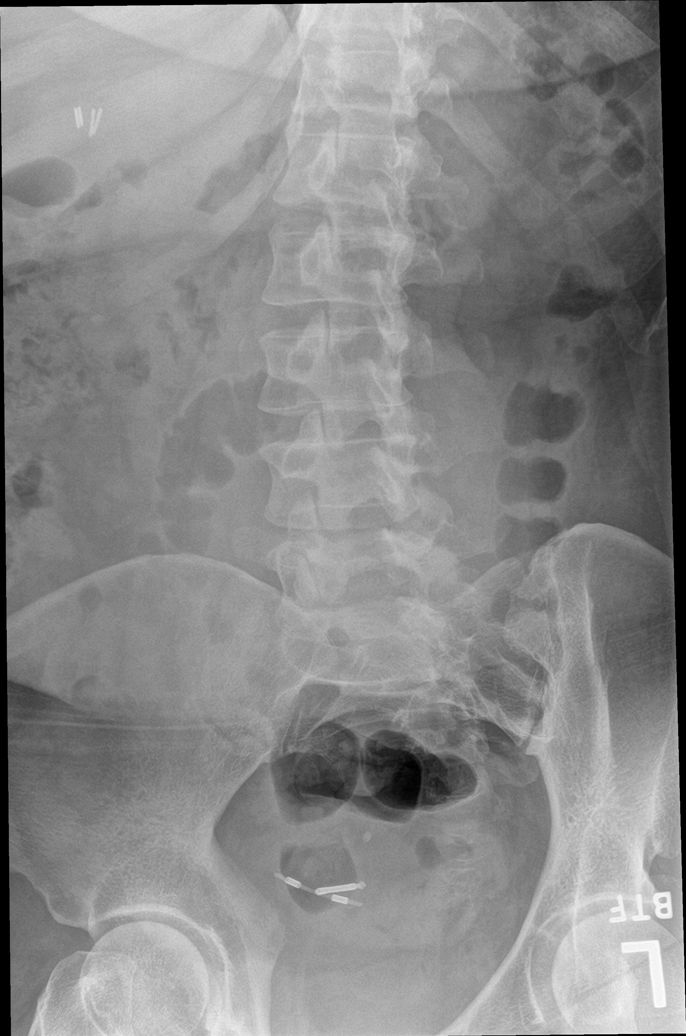

[l-spine obl (2 of 2)]
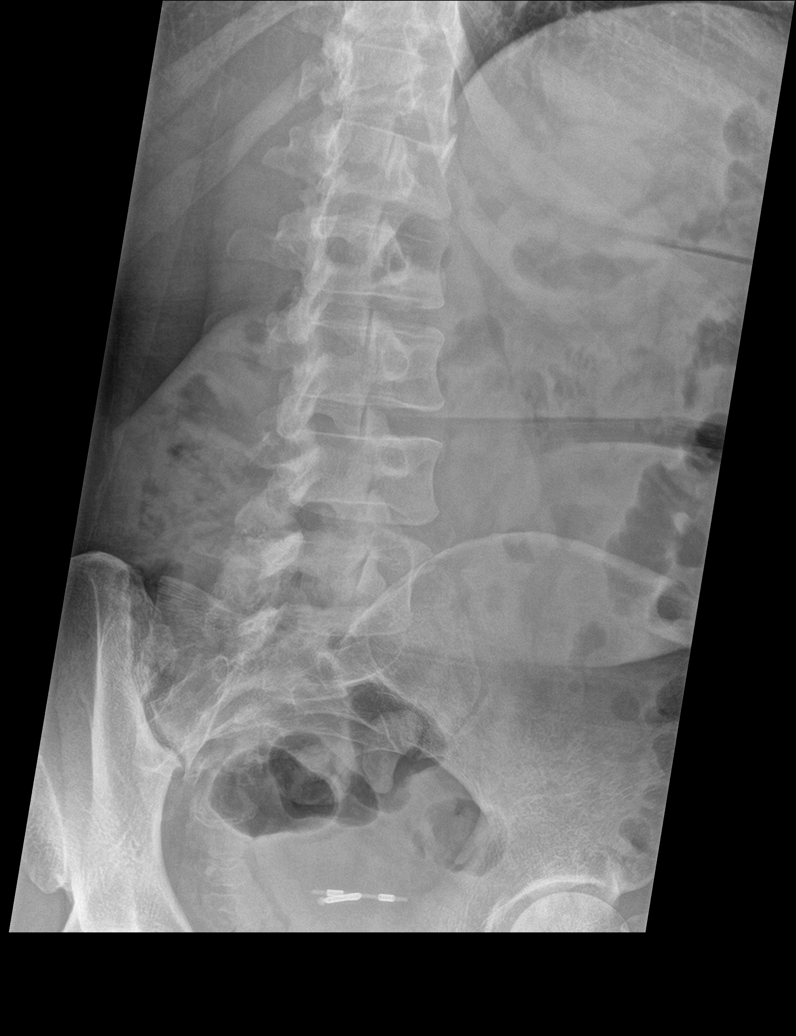

[l-spine lat]
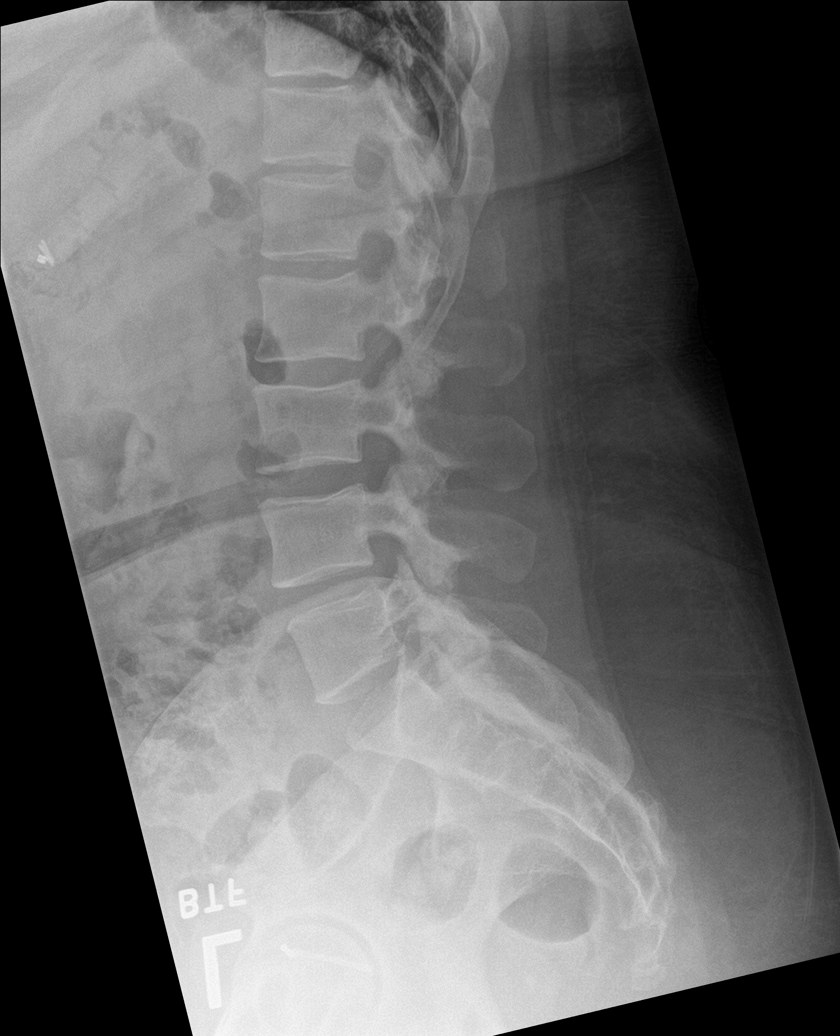

[l-spine spot]
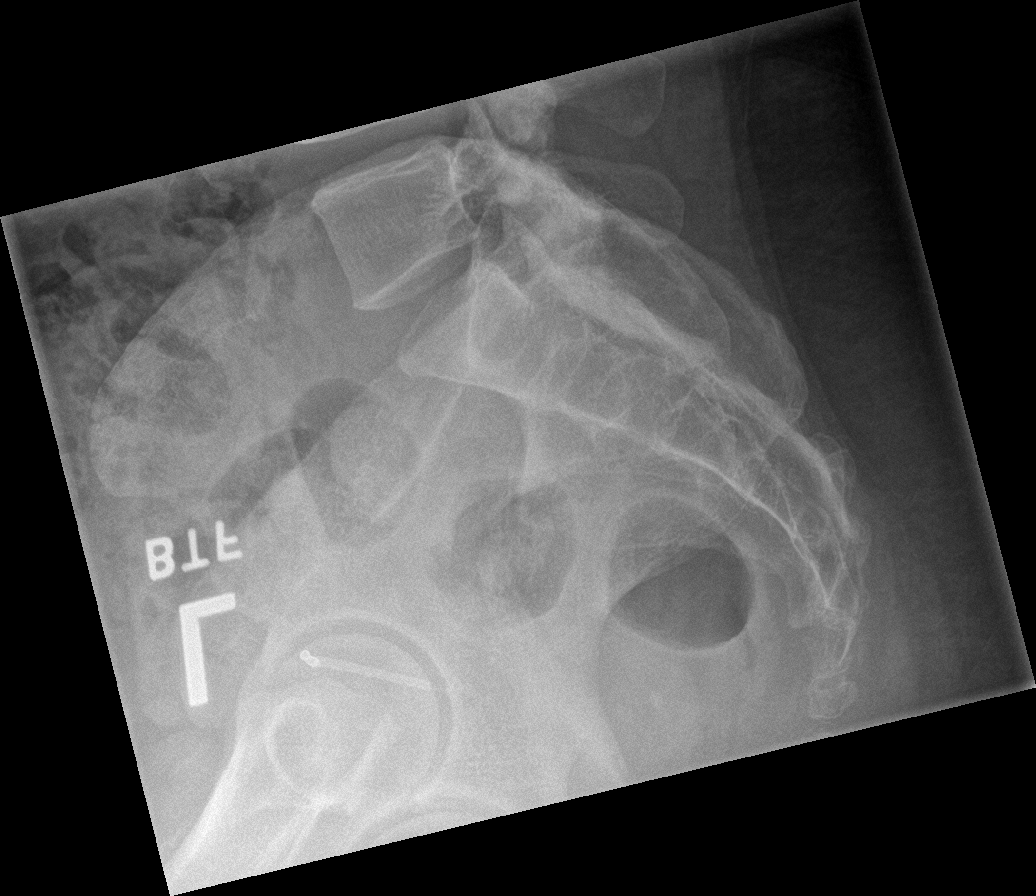

[5 of 5 positions shown; findings below may reference images not displayed]

FINDINGS: There are five non-rib bearing lumbar-type vertebral bodies. There
is normal alignment. There is no evidence for acute fracture or
subluxation. Intervertebral disc spaces are preserved without
significant degenerative changes. Mild lower lumbar facet
arthropathy. Status post cholecystectomy. IUD.
IMPRESSION: No acute osseous abnormality.

## 2020-07-06 ENCOUNTER — Other Ambulatory Visit: Payer: Self-pay | Admitting: Nurse Practitioner

## 2020-07-06 DIAGNOSIS — R1084 Generalized abdominal pain: Secondary | ICD-10-CM

## 2020-07-06 NOTE — Progress Notes (Signed)
I sent in a GI referral to Dr. Roseanne Kaufman office. She should really get in with her gynecologist if she has not because she has had vaginal bleeding since IUD placement. Her abdominal pain may be GYN-related instead of GI, and she already sees a GYN, so she does not need a new referral.

## 2020-07-06 NOTE — Progress Notes (Signed)
The ultrasound was negative. Is she still having abdominal pain?

## 2020-07-07 ENCOUNTER — Telehealth (HOSPITAL_COMMUNITY): Payer: Self-pay | Admitting: Physical Medicine & Rehabilitation

## 2020-07-07 ENCOUNTER — Encounter (HOSPITAL_COMMUNITY): Payer: Medicaid Other | Admitting: Occupational Therapy

## 2020-07-07 NOTE — Telephone Encounter (Signed)
Please let pt know there are no major findings on lumbar xrays. She has mild arthritis (in facets), but otherwise disc spaces, vertebrae look ok.  thanks

## 2020-07-07 NOTE — Telephone Encounter (Signed)
Patient has been notified

## 2020-07-08 ENCOUNTER — Other Ambulatory Visit: Payer: Self-pay

## 2020-07-08 ENCOUNTER — Encounter (HOSPITAL_COMMUNITY): Payer: Self-pay | Admitting: Physical Therapy

## 2020-07-08 ENCOUNTER — Ambulatory Visit (HOSPITAL_COMMUNITY): Payer: Medicaid Other

## 2020-07-08 ENCOUNTER — Ambulatory Visit (HOSPITAL_COMMUNITY): Payer: Medicaid Other | Admitting: Physical Therapy

## 2020-07-08 DIAGNOSIS — M6281 Muscle weakness (generalized): Secondary | ICD-10-CM | POA: Diagnosis not present

## 2020-07-08 DIAGNOSIS — M25612 Stiffness of left shoulder, not elsewhere classified: Secondary | ICD-10-CM

## 2020-07-08 DIAGNOSIS — R262 Difficulty in walking, not elsewhere classified: Secondary | ICD-10-CM | POA: Diagnosis not present

## 2020-07-08 DIAGNOSIS — R29818 Other symptoms and signs involving the nervous system: Secondary | ICD-10-CM | POA: Diagnosis not present

## 2020-07-08 DIAGNOSIS — R278 Other lack of coordination: Secondary | ICD-10-CM

## 2020-07-08 NOTE — Patient Instructions (Signed)
Sensory Retraining Exercises After A Stroke  Post-stroke numbness is different from other types of numbness because the issues originate from the brain, not the local tissue. Therefore, the most popular way of treating numbness is through sensory retraining, also called sensory reeducation. This rehabilitation method relies on a process called neuroplasticity: the brain's natural ability to reorganize and rewire itself and learn new functions. Sensory retraining seeks to retrain the brain how to interpret your senses by practicing different exercises that involve touch. For example, you can gather objects of different textures (like rough sandpaper, fluffy cotton balls, silky material) and feel them without looking. Then, look at the object to provide yourself with feedback. Although sensory retraining exercises can be exceptionally difficult in the beginning (especially if there is no sensation at all) the goal is to slowly rewire the brain through repetitive stimulus.  Sensory Retraining Exercises to Try at Home Sensory retraining exercises can help restore your brain's ability to interpret your senses. All of the exercises involve your sense of touch. Each time you touch something, you send sensory stimulation to your brain and encourage your brain to rewire itself. Repeat each exercise at least 10 times and practice for about 10-15 minutes a day. Remember, repetition and consistency are most important for a speedy recovery from stroke. Here are several effective sensory stimulation activities for stroke patients: 1. Tabletop Touch Therapy Gather together objects with different textures and place them onto a table in front of you. Some examples of objects to grab are soft scarves, rough sandpaper, fluffy cotton balls, rough Velcro, and cool silverware. Without looking at the objects, pick them up and feel them. Try to distinguish the difference between textures. 2. Texture Hunting Fill a bowl  with uncooked rice and bury different textured objects in it, like marbles, coins, Velcro strips, and cotton balls. Then reach your hand into the bowl and try to find the objects without looking. If you can't do this at first, keep trying. Repeated exposure to sensory retraining exercises helps keep the brain stimulated and promote recovery.   3. Texture Handling Have someone place different objects in your hand with your eyes open. Sense how these objects feel. Once you've gone through all the objects and observed how they feel, perform the exercise again with your eyes closed. Put your focus into feeling each object to imprint the connection in your mind. Note any difference between how the objects feel with your eyes open, then closed. 4. Temperature Differentiation This sensory reeducation exercise is particularly beneficial to stroke survivors who have trouble feeling heat or cold. Soak a cloth in cold water and soak another cloth in warm (but not hot) water. Then, have someone place the cold cloth on your arm. Notice the sensation and if it is different than before the stroke. After 30 seconds, have them switch the cold cloth with the warm cloth. Try to sense the difference in temperature. Now, close your eyes. Have someone place one cloth on your arm and try to determine if you're feeling heat or cold. Repeat this exercise back and forth alternating from hot to cold. If you don't have someone to help you, then you can perform this exercise using your unaffected hand to place the cloths on your arm. 5. Sensory Locating Close your eyes and have someone place their hand somewhere on your arm. Then, point to the area that you think they touched. If you don't point to the correct area, have them move your hand. Then, open  your eyes to visually absorb the information. Feedback like this helps retrain your brain. It's like telling your brain, "I was not touched here, I was touched there." Repeat  this exercise at least 10 times, preferably more! Once you master this sensory retraining exercise, switch it up by having your assistant touch you with different textured objects, like a Q-tip or metal spoon. Always keep your eyes closed during the exercise, and if you perform the exercise incorrectly, open your eyes once someone moves your finger to absorb the feedback.

## 2020-07-08 NOTE — Therapy (Signed)
[facility] [place], [place], [address] Phone: [phone]   Fax:  [phone]  Physical Therapy Treatment  Patient Details  Name: Joyce [name] MRN: [id number] Date of Birth: February 19, 1976 Referring Provider (PT): Lauraine Rinne PA   Encounter Date: 07/08/2020   PT End of Session - 07/08/20 1447    Visit Number 11    Number of Visits 17    Date for PT Re-Evaluation 07/29/20    Authorization Type self -pay, [employer]-approved 8 visits approved from 06/12/20 to 07/29/20    Authorization - Visit Number 2    Authorization - Number of Visits 8    Progress Note Due on Visit 19    PT Start Time 2162    PT Stop Time 1530    PT Time Calculation (min) 43 min    Equipment Utilized During Treatment Gait belt    Activity Tolerance Patient limited by fatigue    Behavior During Therapy [facility] for tasks assessed/performed           Past Medical History:  Diagnosis Date  . Acute CVA (cerebrovascular accident) ([place]) 03/04/2020  . Acute CVA (cerebrovascular accident) ([place]) 03/04/2020  . Acute left-sided weakness 03/05/2020  . AMA (advanced maternal age) multigravida 35+ 11/12/2014  . Anemia   . Depression   . [place] multiparity 11/26/2014  . Hypertension   . Migraine   . Pregnant 11/12/2014  . PTSD (post-traumatic stress disorder) 05/01/2017  . Round ligament pain 11/12/2014  . Short of breath on exertion 11/12/2014  . Trichomonas infection     Past Surgical History:  Procedure Laterality Date  . CHOLECYSTECTOMY      There were no vitals filed for this visit.   Subjective Assessment - 07/08/20 1448    Subjective Patient states that back is bothering her. She has pain with lifting L leg which causes a sharp back pain. Pain is worse at night.    Pertinent History HTN, Migraine, PTSD, Acute CVA 03/04/20    How long can you walk comfortably? 5-10 with rollator    Currently in Pain? Yes    Pain Score 8     Pain Location Back     Pain Orientation Lower    Pain Descriptors / Indicators Aching    Pain Type Chronic pain                             [facility] Adult PT Treatment/Exercise - 07/08/20 1501      Knee/Hip Exercises: Standing   Heel Raises Both;1 set;10 reps    Knee Flexion Both;1 set      Knee/Hip Exercises: Seated   Other Seated Knee/Hip Exercises hip abduction and adduction isometrics with belt and ball 10x 10 second holds      Knee/Hip Exercises: Supine   Bridges 10 reps;5 reps    Other Supine Knee/Hip Exercises ab set with exhale x 10, ab set with tactile cueing x 10 with 5 second holds                    PT Short Term Goals - 06/03/20 1331      PT SHORT TERM GOAL #1   Title Patient will report at least 25% improvement in overall symptoms and/or function to demonstrate improved functional mobility    Time 4    Period Weeks    Status Achieved    Target Date 04/29/20  PT SHORT TERM GOAL #2   Title Patient will be independent in self management strategies to improve quality of life and functional outcomes.    Time 4    Period Weeks    Status On-going    Target Date 04/29/20      PT SHORT TERM GOAL #3   Title Patient will be able to transition from sit to stand without use of upper extremities to improve transitional mobility    Baseline left hand on thigh to perform, slow and labored movement    Time 4    Period Weeks    Status Partially Met    Target Date 04/29/20             PT Long Term Goals - 06/03/20 1331      PT LONG TERM GOAL #1   Title Patient will report at least 50% improvement in overall symptoms and/or function to demonstrate improved functional mobility    Time 8    Period Weeks    Status On-going      PT LONG TERM GOAL #2   Title Patient will be able to ascend and descend stairs with use of railing and step through gait pattern to improve ability to go up to bedroom    Baseline unable step through with heavy use of arms on the  railing    Time 8    Period Weeks    Status On-going      PT LONG TERM GOAL #3   Title Patient will be able to ambulate at least 226 feet in 2 minutes without use of Assistive device to improve ability to walk in community    Time 8    Period Weeks    Status On-going                 Plan - 07/08/20 1447    Clinical Impression Statement Patient with c/o increase in back pain limiting ability to complete ADL and HEP. Worked on core and hip strengthening for proximal stability. Patient requires verbal cueing for exhale to achieve TRA activation and is able to complete with tactile cueing for flattening back with core activation. Patient with c/o increase in back pain following initial bridges which improves with cueing for prior glute activation. Patient states improvement following core and hip exercises on table. Patient fatigues quickly with exercises requiring frequent rest breaks throughout session. Patient will continue to benefit from skilled physical therapy in order to reduce impairment and improve function.    Personal Factors and Comorbidities Comorbidity 1;Comorbidity 2    Comorbidities migraines, HTN    Examination-Activity Limitations Locomotion Level;Lift;Transfers;Toileting;Stand;Stairs;Squat;Carry;Bathing    Examination-Participation Restrictions Cleaning;Community Activity;Meal Prep;Shop;Occupation    Stability/Clinical Decision Making Stable/Uncomplicated    Rehab Potential Good    PT Frequency --   1-2x/week for total of 12 visits over 8 week certification period   PT Duration 8 weeks    PT Treatment/Interventions ADLs/Self Care Home Management;Aquatic Therapy;Cryotherapy;Electrical Stimulation;Moist Heat;Traction;Balance training;Therapeutic exercise;Therapeutic activities;Functional mobility training;Stair training;Gait training;DME Instruction;Neuromuscular re-education;Patient/family education;Orthotic Fit/Training;Manual techniques;Energy conservation;Dry  needling;Passive range of motion    PT Next Visit Plan LE strengthening.  focus on muscle activation, walking mechanics, transfers without using UE to mobilize Lt LE, standing exercises.  Progress with open chain PRE and dynamic standing activities    PT Home Exercise Plan LAQs, DF 2/9 ab set, bridge, hip abd/add iso    Consulted and Agree with Plan of Care Patient  Patient will benefit from skilled therapeutic intervention in order to improve the following deficits and impairments:  Decreased coordination,Improper body mechanics,Decreased range of motion,Decreased balance,Decreased mobility,Difficulty walking,Pain,Decreased strength,Decreased activity tolerance,Decreased knowledge of use of DME,Abnormal gait,Decreased endurance  Visit Diagnosis: Muscle weakness (generalized)  Other symptoms and signs involving the nervous system     Problem List Patient Active Problem List   Diagnosis Date Noted  . Chronic left-sided low back pain without sciatica 07/01/2020  . Abdominal pain 06/19/2020  . History of CVA with residual deficit 05/15/2020  . Neuropathic pain 05/06/2020  . Encounter for IUD insertion 04/20/2020  . Anxiety state   . Dyslipidemia   . Labile blood pressure   . Migraine without status migrainosus, not intractable   . Right thalamic infarction ([place]) 03/10/2020  . Left hemiparesis ([place]) 03/10/2020  . Recurrent falls 03/05/2020  . MDD (major depressive disorder), recurrent episode, moderate ([place]) 05/01/2017  . PTSD (post-traumatic stress disorder) 05/01/2017  . Depression, recurrent ([place]) 12/07/2016  . Essential hypertension 07/20/2016  . Uterine fibroid 01/27/2016  . Cervical high risk HPV (human papillomavirus) test positive 12/08/2014    3:38 PM, 07/08/20 Mearl Latin PT, DPT Physical Therapist at [place], [place], [address] Phone:  [phone]   Fax:  [phone]  Name: KAMREN HESKETT MRN: [id number] Date of Birth: Oct 21, 1975

## 2020-07-09 NOTE — Therapy (Signed)
[place], [place], [address] Phone: [phone]   Fax:  [phone]  Occupational Therapy Treatment  Patient Details  Name: Joyce [name] MRN: [id number] Date of Birth: [birth year] Referring Provider (OT): Dr. Alger Simons   Encounter Date: 07/08/2020   OT End of Session - 07/08/20 1503    Visit Number 4    Number of Visits 12    Date for OT Re-Evaluation 08/05/20    Authorization Type [employer]    Authorization Time Period requesting 12 visits from 06/24/20-08/05/20    Authorization - Visit Number 3    Authorization - Number of Visits 12    Progress Note Due on Visit 10    OT Start Time 5462    OT Stop Time 1427    OT Time Calculation (min) 42 min    Activity Tolerance Patient tolerated treatment well;Patient limited by pain    Behavior During Therapy [facility] for tasks assessed/performed           Past Medical History:  Diagnosis Date  . Acute CVA (cerebrovascular accident) ([place]) 03/04/2020  . Acute CVA (cerebrovascular accident) ([place]) 03/04/2020  . Acute left-sided weakness 03/05/2020  . AMA (advanced maternal age) multigravida 35+ 11/12/2014  . Anemia   . Depression   . [place] multiparity 11/26/2014  . Hypertension   . Migraine   . Pregnant 11/12/2014  . PTSD (post-traumatic stress disorder) 05/01/2017  . Round ligament pain 11/12/2014  . Short of breath on exertion 11/12/2014  . Trichomonas infection     Past Surgical History:  Procedure Laterality Date  . CHOLECYSTECTOMY      There were no vitals filed for this visit.   Subjective Assessment - 07/09/20 1645    Subjective  S: I've had this back pain since my stroke.    Currently in Pain? Yes    Pain Score 9     Pain Location Back    Pain Orientation Lower    Pain Descriptors / Indicators Aching    Pain Type Acute pain    Pain Onset Other (comment)   since the stroke   Pain Frequency Constant    Aggravating Factors  raising arms above  shoulder, reaching too far forward    Pain Relieving Factors heat during OT session helped.    Effect of Pain on Daily Activities severe effect              [facility] OT Assessment - 07/08/20 1424      Assessment   Medical Diagnosis Left Hemiplegia      Precautions   Precautions Fall                    OT Treatments/Exercises (OP) - 07/08/20 1416      Exercises   Exercises Shoulder;Wrist;Hand;Theraputty;Elbow      Additional Elbow Exercises   Hand Gripper with Large Beads all beads with gripper set at 11#   horizontal   Hand Gripper with Medium Beads all beads with gripper set at 11#   horizontal   Hand Gripper with Small Beads 6 beads completed with gripper set at 11#   horizontal     Additional Wrist Exercises   Sponges 12, 14      Hand Exercises   Other Hand Exercises Using green resistive clothespin, patient used a 3 point pinch to pick up 25 sponges and place in container.    Other Hand Exercises Utilized yellow resistive clothespin in left  hand with 3 point pinch to stack to cubes on a low level elevated surface while incorporating functional shoulder reaching.      Modalities   Modalities Moist Heat      Moist Heat Therapy   Moist Heat Location Lumbar Spine   provided to low back during treatment session for pain                 OT Education - 07/08/20 1426    Education Details Education provided on sensation after stroke with unknown timeframe for return. Provided HEP for sensory retraining exercises to complete at home.    Methods Explanation;Handout    Comprehension Verbalized understanding            OT Short Term Goals - 06/26/20 1445      OT SHORT TERM GOAL #1   Title Patient will be educated and independent with HEP for improved functional use of LUE.    Time 3    Period Weeks    Status On-going    Target Date 07/15/20      OT SHORT TERM GOAL #2   Title Patient will improve LUE A/ROM to [facility] in order to use LUE as an active  assist with daily tasks.    Time 3    Period Weeks    Status On-going      OT SHORT TERM GOAL #3   Title Patient will improve LUE strength to 4/5 or better for increased ability to reach into overhead cabinet.    Time 3    Period Weeks    Status On-going             OT Long Term Goals - 06/26/20 1444      OT LONG TERM GOAL #1   Title Patient will complete B/IADLs, leisure tasks at highest level of I possible.    Time 6    Period Weeks    Status On-going      OT LONG TERM GOAL #2   Title Patient will improve left upper extremity grip strength to 15# or better and pinch strength to 7# or better for increased independence in opening containers.    Time 6    Period Weeks    Status On-going      OT LONG TERM GOAL #3   Title Patient will decrease completion time on nine hole peg test to 30" or less with left hand for improved coordination needed to tie shoes, braid hair, manipulate change.    Time 6    Period Weeks    Status On-going      OT LONG TERM GOAL #4   Title Patient will improve left hand sensation to [facility] with decreased pins and needles sensation.    Time 6    Period Weeks    Status On-going                 Plan - 07/09/20 1649    Clinical Impression Statement A: Due to high level of pain, session was completed seated at table while focusing on grip and pinch strengthening. Moist heat provided for lower back during session with patient reports of increased comfort level although pain still present. Pt voices concerns regarding decreased sensation in primarily fingertips of left hand. She reports that it was worse immediately after the stroke and has improved since. Education provided regarding sensory retraining (see education). Focused on grip and pinch strengthening using handgripper and resistive clothespins. Pt required severeal rest breaks during tasks  due to muscle weakness and fatigue. VC for form and technique provided.    Body Structure / Function /  Physical Skills ADL;Strength;Dexterity;Pain;UE functional use;IADL;ROM;Sensation;Coordination;FMC    Plan P: Continue with grip and pinch strengthening. Attempt to complete hangripper task while picking up all small beads. Follow up on sensory retraining exercises provided at last session.   OT Home Exercise Plan 1/26:  table slides and [facility] training 2/9: Sensory retraining exercises    Consulted and Agree with Plan of Care Patient           Patient will benefit from skilled therapeutic intervention in order to improve the following deficits and impairments:   Body Structure / Function / Physical Skills: ADL,Strength,Dexterity,Pain,UE functional use,IADL,ROM,Sensation,Coordination,FMC       Visit Diagnosis: Stiffness of left shoulder, not elsewhere classified  Other symptoms and signs involving the nervous system  Other lack of coordination    Problem List Patient Active Problem List   Diagnosis Date Noted  . Chronic left-sided low back pain without sciatica 07/01/2020  . Abdominal pain 06/19/2020  . History of CVA with residual deficit 05/15/2020  . Neuropathic pain 05/06/2020  . Encounter for IUD insertion 04/20/2020  . Anxiety state   . Dyslipidemia   . Labile blood pressure   . Migraine without status migrainosus, not intractable   . Right thalamic infarction ([place]) 03/10/2020  . Left hemiparesis ([place]) 03/10/2020  . Recurrent falls 03/05/2020  . MDD (major depressive disorder), recurrent episode, moderate ([place]) 05/01/2017  . PTSD (post-traumatic stress disorder) 05/01/2017  . Depression, recurrent ([place]) 12/07/2016  . Essential hypertension 07/20/2016  . Uterine fibroid 01/27/2016  . Cervical high risk HPV (human papillomavirus) test positive 12/08/2014    Ailene Ravel, OTR/L,CBIS  [phone]  07/09/2020, 5:08 PM  [facility] [address] [place], [place], [address] Phone: [phone]   Fax:   [phone]  Name: Joyce [name] MRN: [id number] Date of Birth: 09/21/1975

## 2020-07-10 ENCOUNTER — Telehealth (HOSPITAL_COMMUNITY): Payer: Self-pay | Admitting: Occupational Therapy

## 2020-07-10 ENCOUNTER — Ambulatory Visit (HOSPITAL_COMMUNITY): Payer: Medicaid Other

## 2020-07-10 ENCOUNTER — Telehealth (HOSPITAL_COMMUNITY): Payer: Self-pay

## 2020-07-10 ENCOUNTER — Ambulatory Visit (HOSPITAL_COMMUNITY): Payer: Medicaid Other | Admitting: Occupational Therapy

## 2020-07-10 NOTE — Telephone Encounter (Signed)
pt is having back problems called to cx today's appt.

## 2020-07-14 ENCOUNTER — Ambulatory Visit (HOSPITAL_COMMUNITY): Payer: Medicaid Other | Admitting: Occupational Therapy

## 2020-07-14 ENCOUNTER — Encounter (HOSPITAL_COMMUNITY): Payer: Self-pay | Admitting: Occupational Therapy

## 2020-07-14 ENCOUNTER — Other Ambulatory Visit: Payer: Self-pay

## 2020-07-14 ENCOUNTER — Ambulatory Visit (HOSPITAL_COMMUNITY): Payer: Medicaid Other

## 2020-07-14 DIAGNOSIS — R262 Difficulty in walking, not elsewhere classified: Secondary | ICD-10-CM | POA: Diagnosis not present

## 2020-07-14 DIAGNOSIS — R278 Other lack of coordination: Secondary | ICD-10-CM | POA: Diagnosis not present

## 2020-07-14 DIAGNOSIS — R29818 Other symptoms and signs involving the nervous system: Secondary | ICD-10-CM

## 2020-07-14 DIAGNOSIS — M6281 Muscle weakness (generalized): Secondary | ICD-10-CM

## 2020-07-14 DIAGNOSIS — M25612 Stiffness of left shoulder, not elsewhere classified: Secondary | ICD-10-CM

## 2020-07-14 NOTE — Therapy (Addendum)
[place], [place], [address] Phone: [phone]   Fax:  [phone]  Occupational Therapy Treatment  Patient Details  Name: Joyce [name] MRN: [id number] Date of Birth: November 20, 1975 Referring Provider (OT): Dr. Alger Simons   Encounter Date: 07/14/2020   OT End of Session - 07/14/20 1113    Visit Number 5    Number of Visits 12    Date for OT Re-Evaluation 08/05/20    Authorization Type [employer]    Authorization Time Period 12 visits approved from 06/24/20-08/05/20    Authorization - Visit Number 4    Authorization - Number of Visits 12    Progress Note Due on Visit 10    OT Start Time 1032    OT Stop Time 1111    OT Time Calculation (min) 39 min    Activity Tolerance Patient tolerated treatment well;Patient limited by pain    Behavior During Therapy [facility] for tasks assessed/performed           Past Medical History:  Diagnosis Date  . Acute CVA (cerebrovascular accident) ([place]) 03/04/2020  . Acute CVA (cerebrovascular accident) ([place]) 03/04/2020  . Acute left-sided weakness 03/05/2020  . AMA (advanced maternal age) multigravida 35+ 11/12/2014  . Anemia   . Depression   . [place] multiparity 11/26/2014  . Hypertension   . Migraine   . Pregnant 11/12/2014  . PTSD (post-traumatic stress disorder) 05/01/2017  . Round ligament pain 11/12/2014  . Short of breath on exertion 11/12/2014  . Trichomonas infection     Past Surgical History:  Procedure Laterality Date  . CHOLECYSTECTOMY      There were no vitals filed for this visit.   Subjective Assessment - 07/14/20 1033    Subjective  S: My elbow is hurting where I fell.    Currently in Pain? Yes    Pain Score 3     Pain Location Elbow    Pain Orientation Left    Pain Descriptors / Indicators Aching;Sore    Pain Type Acute pain    Pain Radiating Towards N/A    Pain Onset In the past 7 days    Pain Frequency Intermittent    Aggravating Factors   stretching arm out    Pain Relieving Factors rest    Effect of Pain on Daily Activities mod effect on ADLs              [facility] OT Assessment - 07/14/20 1032      Assessment   Medical Diagnosis Left Hemiplegia      Precautions   Precautions Fall                    OT Treatments/Exercises (OP) - 07/14/20 1034      Exercises   Exercises Shoulder;Wrist;Hand;Theraputty;Elbow      Additional Elbow Exercises   Hand Gripper with Large Beads all beads with gripper set at 20#, vertical    Hand Gripper with Medium Beads all beads with gripper set at 20#, vertical    Hand Gripper with Small Beads all beads with gripper set at 20#, horizontal      Additional Wrist Exercises   Sponges 11, 14, 13      Hand Exercises   Other Hand Exercises Pt using red clothespin to grasp and stack 20 sponges in 4 towers of 5. Increased time for coordination and stacking. Pt then using green clothespin to unstack and place back into bucket-grasping by corners instead  of opening fully due to weakness.    Other Hand Exercises Pt pulling squigz up from table top, 10X, working on wrist strengthening and stability.      Neurological Re-education Exercises   Shoulder Flexion AROM;10 reps    Shoulder Protraction AROM;10 reps      Fine Motor Coordination (Hand/Wrist)   Fine Motor Coordination Flipping cards;Manipulating coins    Flipping cards Pt holding card in left hand, rotating 2 full turns and then flipping 2 full turns. Increased time and concentration for task. Completed 10 cards    Manipulating coins Pt holding coins in palm and working on palm to fingertip translation to bring to fingertips and drop into slotted container. Min difficulty and slightly increased time for completion. Occasional dropped coin.                    OT Short Term Goals - 06/26/20 1445      OT SHORT TERM GOAL #1   Title Patient will be educated and independent with HEP for improved functional use of LUE.     Time 3    Period Weeks    Status On-going    Target Date 07/15/20      OT SHORT TERM GOAL #2   Title Patient will improve LUE A/ROM to [facility] in order to use LUE as an active assist with daily tasks.    Time 3    Period Weeks    Status On-going      OT SHORT TERM GOAL #3   Title Patient will improve LUE strength to 4/5 or better for increased ability to reach into overhead cabinet.    Time 3    Period Weeks    Status On-going             OT Long Term Goals - 06/26/20 1444      OT LONG TERM GOAL #1   Title Patient will complete B/IADLs, leisure tasks at highest level of I possible.    Time 6    Period Weeks    Status On-going      OT LONG TERM GOAL #2   Title Patient will improve left upper extremity grip strength to 15# or better and pinch strength to 7# or better for increased independence in opening containers.    Time 6    Period Weeks    Status On-going      OT LONG TERM GOAL #3   Title Patient will decrease completion time on nine hole peg test to 30" or less with left hand for improved coordination needed to tie shoes, braid hair, manipulate change.    Time 6    Period Weeks    Status On-going      OT LONG TERM GOAL #4   Title Patient will improve left hand sensation to [facility] with decreased pins and needles sensation.    Time 6    Period Weeks    Status On-going                 Plan - 07/14/20 1048    Clinical Impression Statement A: Pt reports she fell about 4 days ago and has had elbow soreness since this time. Session completed at table working on LUE shoulder ROM/strengthening, grip and pinch strengthening, and fine motor coordination. Increased gripper resistance to 20# today. Pt unable to complete horizontal abduction and abduction due to elbow discomfort. During card flip task pt reporting the cards in her left hand feel rough  and the cards in her right hand feel smooth. Added squgiz activity for wrist stability.    Body Structure / Function /  Physical Skills ADL;Strength;Dexterity;Pain;UE functional use;IADL;ROM;Sensation;Coordination;FMC    Plan P: Attempt all LUE shoulder A/ROM, add grooved pegboard fine motor task    OT Home Exercise Plan 1/26:  table slides and [facility] training 2/9: Sensory retraining exercises    Consulted and Agree with Plan of Care Patient           Patient will benefit from skilled therapeutic intervention in order to improve the following deficits and impairments:   Body Structure / Function / Physical Skills: ADL,Strength,Dexterity,Pain,UE functional use,IADL,ROM,Sensation,Coordination,FMC       Visit Diagnosis: Other symptoms and signs involving the nervous system  Stiffness of left shoulder, not elsewhere classified  Other lack of coordination    Problem List Patient Active Problem List   Diagnosis Date Noted  . Chronic left-sided low back pain without sciatica 07/01/2020  . Abdominal pain 06/19/2020  . History of CVA with residual deficit 05/15/2020  . Neuropathic pain 05/06/2020  . Encounter for IUD insertion 04/20/2020  . Anxiety state   . Dyslipidemia   . Labile blood pressure   . Migraine without status migrainosus, not intractable   . Right thalamic infarction ([place]) 03/10/2020  . Left hemiparesis ([place]) 03/10/2020  . Recurrent falls 03/05/2020  . MDD (major depressive disorder), recurrent episode, moderate ([place]) 05/01/2017  . PTSD (post-traumatic stress disorder) 05/01/2017  . Depression, recurrent ([place]) 12/07/2016  . Essential hypertension 07/20/2016  . Uterine fibroid 01/27/2016  . Cervical high risk HPV (human papillomavirus) test positive 12/08/2014   Guadelupe Sabin, OTR/L  [phone] 07/14/2020, 11:14 AM  [place], [place], [address] Phone: [phone]   Fax:  [phone]  Name: Joyce [name] MRN: [id number] Date of Birth: [birth year]

## 2020-07-14 NOTE — Therapy (Signed)
[place], [place], [address] Phone: [phone]   Fax:  [phone]  Physical Therapy Treatment  Patient Details  Name: Joyce [name] MRN: [id number] Date of Birth: 1976/01/25 Referring Provider (PT): Lauraine Rinne PA   Encounter Date: 07/14/2020   PT End of Session - 07/14/20 0947    Visit Number 12    Number of Visits 17    Date for PT Re-Evaluation 07/29/20    Authorization Type self -pay, [employer]-approved 8 visits approved from 06/12/20 to 07/29/20    Authorization - Visit Number 3    Authorization - Number of Visits 8    Progress Note Due on Visit 19    PT Start Time 0948    PT Stop Time 1030    PT Time Calculation (min) 42 min    Equipment Utilized During Treatment Gait belt    Activity Tolerance Patient limited by fatigue    Behavior During Therapy [facility] for tasks assessed/performed           Past Medical History:  Diagnosis Date  . Acute CVA (cerebrovascular accident) ([place]) 03/04/2020  . Acute CVA (cerebrovascular accident) ([place]) 03/04/2020  . Acute left-sided weakness 03/05/2020  . AMA (advanced maternal age) multigravida 35+ 11/12/2014  . Anemia   . Depression   . [place] multiparity 11/26/2014  . Hypertension   . Migraine   . Pregnant 11/12/2014  . PTSD (post-traumatic stress disorder) 05/01/2017  . Round ligament pain 11/12/2014  . Short of breath on exertion 11/12/2014  . Trichomonas infection     Past Surgical History:  Procedure Laterality Date  . CHOLECYSTECTOMY      There were no vitals filed for this visit.   Subjective Assessment - 07/14/20 1004    Subjective Patient reports recent fall at home and reports her left leg felt weak and gave out resulting fall and notes left elbow pain since this incident    Pertinent History HTN, Migraine, PTSD, Acute CVA 03/04/20    Currently in Pain? Yes    Pain Score 4     Pain Location Elbow    Pain Orientation Left    Pain Descriptors /  Indicators Aching              [facility] PT Assessment - 07/14/20 0001      Assessment   Medical Diagnosis Left Hemiplegia      Precautions   Precautions Fall                         [facility] Adult PT Treatment/Exercise - 07/14/20 0001      Knee/Hip Exercises: Aerobic   Nustep seat level/arm level 10      Knee/Hip Exercises: Standing   Other Standing Knee Exercises standing in // bars with 2" step performing alternating stair taps with emphasis on rapid speed 3x30 sec    Other Standing Knee Exercises seated perofrming rapid, alternating foot stomps on Bosu trainer 2x30 sec      Knee/Hip Exercises: Seated   Sit to Sand without UE support   5x5 reps from 22" seat height                 PT Education - 07/14/20 1011    Education Details education on continued HEP to improve LLE strength/power to facilitate normal walking/balance    Person(s) Educated Patient    Methods Explanation    Comprehension Verbalized understanding  PT Short Term Goals - 06/03/20 1331      PT SHORT TERM GOAL #1   Title Patient will report at least 25% improvement in overall symptoms and/or function to demonstrate improved functional mobility    Time 4    Period Weeks    Status Achieved    Target Date 04/29/20      PT SHORT TERM GOAL #2   Title Patient will be independent in self management strategies to improve quality of life and functional outcomes.    Time 4    Period Weeks    Status On-going    Target Date 04/29/20      PT SHORT TERM GOAL #3   Title Patient will be able to transition from sit to stand without use of upper extremities to improve transitional mobility    Baseline left hand on thigh to perform, slow and labored movement    Time 4    Period Weeks    Status Partially Met    Target Date 04/29/20             PT Long Term Goals - 06/03/20 1331      PT LONG TERM GOAL #1   Title Patient will report at least 50% improvement in overall  symptoms and/or function to demonstrate improved functional mobility    Time 8    Period Weeks    Status On-going      PT LONG TERM GOAL #2   Title Patient will be able to ascend and descend stairs with use of railing and step through gait pattern to improve ability to go up to bedroom    Baseline unable step through with heavy use of arms on the railing    Time 8    Period Weeks    Status On-going      PT LONG TERM GOAL #3   Title Patient will be able to ambulate at least 226 feet in 2 minutes without use of Assistive device to improve ability to walk in community    Time 8    Period Weeks    Status On-going                 Plan - 07/14/20 1012    Clinical Impression Statement Patient progressing with POC details and demonstrates improved activity tolerance as evidenced by decreased need for therapeutic rest periods between tasks and reporting improved energy levels at home during mobility/ADL.  Continue with POC to improve LLE strength, coordination, and power to progress to ambulation with less restrictive AD (cane vs rollator)    Personal Factors and Comorbidities Comorbidity 1;Comorbidity 2    Comorbidities migraines, HTN    Examination-Activity Limitations Locomotion Level;Lift;Transfers;Toileting;Stand;Stairs;Squat;Carry;Bathing    Examination-Participation Restrictions Cleaning;Community Activity;Meal Prep;Shop;Occupation    Stability/Clinical Decision Making Stable/Uncomplicated    Rehab Potential Good    PT Frequency --   1-2x/week for total of 12 visits over 8 week certification period   PT Duration 8 weeks    PT Treatment/Interventions ADLs/Self Care Home Management;Aquatic Therapy;Cryotherapy;Electrical Stimulation;Moist Heat;Traction;Balance training;Therapeutic exercise;Therapeutic activities;Functional mobility training;Stair training;Gait training;DME Instruction;Neuromuscular re-education;Patient/family education;Orthotic Fit/Training;Manual techniques;Energy  conservation;Dry needling;Passive range of motion    PT Next Visit Plan Work on power, speed, alternating movements with greater speed/amplitude.  Trial ambulation with cane and CGA    PT Home Exercise Plan LAQs, DF 2/9 ab set, bridge, hip abd/add iso. 2/15 sit to stand from EOB    Consulted and Agree with Plan of Care Patient  Patient will benefit from skilled therapeutic intervention in order to improve the following deficits and impairments:  Decreased coordination,Improper body mechanics,Decreased range of motion,Decreased balance,Decreased mobility,Difficulty walking,Pain,Decreased strength,Decreased activity tolerance,Decreased knowledge of use of DME,Abnormal gait,Decreased endurance  Visit Diagnosis: Muscle weakness (generalized)  Other symptoms and signs involving the nervous system  Difficulty in walking, not elsewhere classified     Problem List Patient Active Problem List   Diagnosis Date Noted  . Chronic left-sided low back pain without sciatica 07/01/2020  . Abdominal pain 06/19/2020  . History of CVA with residual deficit 05/15/2020  . Neuropathic pain 05/06/2020  . Encounter for IUD insertion 04/20/2020  . Anxiety state   . Dyslipidemia   . Labile blood pressure   . Migraine without status migrainosus, not intractable   . Right thalamic infarction ([place]) 03/10/2020  . Left hemiparesis ([place]) 03/10/2020  . Recurrent falls 03/05/2020  . MDD (major depressive disorder), recurrent episode, moderate ([place]) 05/01/2017  . PTSD (post-traumatic stress disorder) 05/01/2017  . Depression, recurrent ([place]) 12/07/2016  . Essential hypertension 07/20/2016  . Uterine fibroid 01/27/2016  . Cervical high risk HPV (human papillomavirus) test positive 12/08/2014    10:39 AM, 07/14/20 M. Sherlyn Lees, PT, DPT Physical Therapist- [place] Office Number: [phone]  [place] [address] [place], [place],  [address] Phone: [phone]   Fax:  [phone]  Name: SEINI LANNOM MRN: [id number] Date of Birth: 06-04-1975

## 2020-07-17 ENCOUNTER — Ambulatory Visit (HOSPITAL_COMMUNITY): Payer: Medicaid Other

## 2020-07-17 ENCOUNTER — Telehealth (HOSPITAL_COMMUNITY): Payer: Self-pay

## 2020-07-17 NOTE — Telephone Encounter (Signed)
pt called to cx both appts due to she has no babysitter for her kids.

## 2020-07-20 ENCOUNTER — Telehealth: Payer: Self-pay | Admitting: *Deleted

## 2020-07-20 MED ORDER — ASPIRIN 325 MG PO TBEC: 325.0000 mg | DELAYED_RELEASE_TABLET | Freq: Every day | ORAL | 0 refills | Status: DC

## 2020-07-20 NOTE — Telephone Encounter (Signed)
Joyce [name] called for a refill on her ECASA.

## 2020-07-21 ENCOUNTER — Ambulatory Visit (HOSPITAL_COMMUNITY): Payer: Medicaid Other

## 2020-07-21 ENCOUNTER — Telehealth (HOSPITAL_COMMUNITY): Payer: Self-pay

## 2020-07-21 ENCOUNTER — Ambulatory Visit (HOSPITAL_COMMUNITY): Payer: Medicaid Other | Admitting: Occupational Therapy

## 2020-07-21 ENCOUNTER — Telehealth (HOSPITAL_COMMUNITY): Payer: Self-pay | Admitting: Occupational Therapy

## 2020-07-21 NOTE — Telephone Encounter (Signed)
pt called to cx these appts due to she stated that her transportation had gotten mixed up.

## 2020-07-24 ENCOUNTER — Ambulatory Visit (HOSPITAL_COMMUNITY): Payer: Medicaid Other

## 2020-07-27 ENCOUNTER — Other Ambulatory Visit: Payer: Self-pay

## 2020-07-27 DIAGNOSIS — F4310 Post-traumatic stress disorder, unspecified: Secondary | ICD-10-CM

## 2020-07-27 DIAGNOSIS — F339 Major depressive disorder, recurrent, unspecified: Secondary | ICD-10-CM

## 2020-07-27 DIAGNOSIS — F331 Major depressive disorder, recurrent, moderate: Secondary | ICD-10-CM

## 2020-07-28 ENCOUNTER — Telehealth: Payer: Self-pay

## 2020-07-28 ENCOUNTER — Ambulatory Visit (HOSPITAL_COMMUNITY): Payer: Medicaid Other

## 2020-07-28 NOTE — Telephone Encounter (Signed)
S/w pt she is having some back pain and wil not be here today

## 2020-07-28 NOTE — Telephone Encounter (Signed)
Pt has back pain today and will not be here

## 2020-07-30 ENCOUNTER — Telehealth: Payer: Self-pay | Admitting: *Deleted

## 2020-07-30 NOTE — Telephone Encounter (Signed)
Joyce [name] called for a refill on her aspirin and reports that she is having dizzy spells when she changes positions like stands from sitting and walking.  I was unable to reach Joyce Hefel but left a detailed message on her mobile number per DPR that her aspirin was sent to pharmacy on 07/20/20 #100 and any refills are to be handled by her primary care and she should call them to address the dizziness. If she has problems tonight she should seek evaluation in urgent care or ED.

## 2020-07-31 ENCOUNTER — Ambulatory Visit (HOSPITAL_COMMUNITY): Payer: Medicaid Other | Attending: Physical Medicine & Rehabilitation | Admitting: Occupational Therapy

## 2020-07-31 ENCOUNTER — Other Ambulatory Visit: Payer: Self-pay

## 2020-07-31 ENCOUNTER — Encounter (HOSPITAL_COMMUNITY): Payer: Self-pay | Admitting: Occupational Therapy

## 2020-07-31 ENCOUNTER — Ambulatory Visit (HOSPITAL_COMMUNITY): Payer: Medicaid Other

## 2020-07-31 DIAGNOSIS — R278 Other lack of coordination: Secondary | ICD-10-CM | POA: Diagnosis present

## 2020-07-31 DIAGNOSIS — M6281 Muscle weakness (generalized): Secondary | ICD-10-CM | POA: Diagnosis present

## 2020-07-31 DIAGNOSIS — R29818 Other symptoms and signs involving the nervous system: Secondary | ICD-10-CM | POA: Insufficient documentation

## 2020-07-31 DIAGNOSIS — M25612 Stiffness of left shoulder, not elsewhere classified: Secondary | ICD-10-CM | POA: Insufficient documentation

## 2020-07-31 DIAGNOSIS — R262 Difficulty in walking, not elsewhere classified: Secondary | ICD-10-CM | POA: Diagnosis present

## 2020-07-31 NOTE — Therapy (Signed)
[place], [place], [address] Phone: [phone]   Fax:  [phone]  Occupational Therapy Reassessment, Treatment (recertification)  Patient Details  Name: Joyce [name] MRN: [id number] Date of Birth: 07-15-1975 Referring Provider (OT): Dr. Alger Simons   Encounter Date: 07/31/2020   OT End of Session - 07/31/20 1154    Visit Number 6    Number of Visits 14    Date for OT Re-Evaluation 08/30/20    Authorization Type [employer]    Authorization Time Period 12 visits approved from 06/24/20-08/05/20; requesting 7 additional visits    Authorization - Visit Number 4    Authorization - Number of Visits 12    Progress Note Due on Visit --    OT Start Time 1116    OT Stop Time 1156    OT Time Calculation (min) 40 min    Activity Tolerance Patient tolerated treatment well;Patient limited by pain    Behavior During Therapy [facility] for tasks assessed/performed           Past Medical History:  Diagnosis Date  . Acute CVA (cerebrovascular accident) ([place]) 03/04/2020  . Acute CVA (cerebrovascular accident) ([place]) 03/04/2020  . Acute left-sided weakness 03/05/2020  . AMA (advanced maternal age) multigravida 35+ 11/12/2014  . Anemia   . Depression   . [place] multiparity 11/26/2014  . Hypertension   . Migraine   . Pregnant 11/12/2014  . PTSD (post-traumatic stress disorder) 05/01/2017  . Round ligament pain 11/12/2014  . Short of breath on exertion 11/12/2014  . Trichomonas infection     Past Surgical History:  Procedure Laterality Date  . CHOLECYSTECTOMY      There were no vitals filed for this visit.   Subjective Assessment - 07/31/20 1257    Subjective  S: I'm feeling ok, my elbow still sometimes.    Currently in Pain? No/denies              [facility] OT Assessment - 07/31/20 1116      Assessment   Medical Diagnosis Left Hemiplegia      Precautions   Precautions Fall      Coordination   Left 9 Hole Peg  Test 34.87"   39.27" previous     AROM   Left Shoulder Flexion 118 Degrees   100 previous   Left Shoulder ABduction 111 Degrees   90 previous   Left Shoulder Internal Rotation 80 Degrees   same as previous   Left Shoulder External Rotation 56 Degrees   15 previous     Strength   Left Shoulder Flexion 4-/5   3+/5 previous   Left Shoulder ABduction 3+/5   same as previous   Left Shoulder Internal Rotation 4-/5   3+/5 previous   Left Shoulder External Rotation 3+/5   same as  previous   Left Elbow Flexion 3+/5   4-/5 previous   Left Elbow Extension 3/5   4-/5 previous   Left Forearm Pronation 3+/5   same as previous   Left Forearm Supination 3+/5   same as previous   Left Wrist Flexion 3+/5   same as previous   Left Wrist Extension 3+/5   same as previous     Hand Function   Left Hand Grip (lbs) 7   5 previous   Left Hand Lateral Pinch 5 lbs   4 previous   Left 3 point pinch 5 lbs   4 previous  OT Treatments/Exercises (OP) - 07/31/20 1133      Exercises   Exercises Shoulder;Wrist;Hand;Theraputty;Elbow      Additional Elbow Exercises   Hand Gripper with Large Beads all beads with gripper set at 22#, vertical    Hand Gripper with Medium Beads all beads with gripper set at 22#, horizontal    Hand Gripper with Small Beads all beads with gripper set at 22#, horizontal      Neurological Re-education Exercises   Shoulder Flexion AROM;10 reps    Shoulder ABduction AROM;10 reps    Shoulder Protraction AROM;10 reps    Shoulder Horizontal ABduction AROM;10 reps    Other Exercises 1 Pt placing yellow, red, and green clothespins along vertical bar of pinch tree. Working on functional reaching with LUE as well as pinch strength.      Fine Motor Coordination (Hand/Wrist)   Fine Motor Coordination Small Pegboard    Small Pegboard pt holding pegs in left hand and woring on in-hand manipulation to bring to fingertips and place into pegboard. Pt dropping items  occasionally, after completion pt then removing and placing all pegs into palm for sustained grasp before placing back into container.                    OT Short Term Goals - 06/26/20 1445      OT SHORT TERM GOAL #1   Title Patient will be educated and independent with HEP for improved functional use of LUE.    Time 3    Period Weeks    Status On-going    Target Date 07/15/20      OT SHORT TERM GOAL #2   Title Patient will improve LUE A/ROM to [facility] in order to use LUE as an active assist with daily tasks.    Time 3    Period Weeks    Status On-going      OT SHORT TERM GOAL #3   Title Patient will improve LUE strength to 4/5 or better for increased ability to reach into overhead cabinet.    Time 3    Period Weeks    Status On-going             OT Long Term Goals - 06/26/20 1444      OT LONG TERM GOAL #1   Title Patient will complete B/IADLs, leisure tasks at highest level of I possible.    Time 6    Period Weeks    Status On-going      OT LONG TERM GOAL #2   Title Patient will improve left upper extremity grip strength to 15# or better and pinch strength to 7# or better for increased independence in opening containers.    Time 6    Period Weeks    Status On-going      OT LONG TERM GOAL #3   Title Patient will decrease completion time on nine hole peg test to 30" or less with left hand for improved coordination needed to tie shoes, braid hair, manipulate change.    Time 6    Period Weeks    Status On-going      OT LONG TERM GOAL #4   Title Patient will improve left hand sensation to [facility] with decreased pins and needles sensation.    Time 6    Period Weeks    Status On-going                 Plan - 07/31/20 1200  Clinical Impression Statement A: Reassessment completed today as pt has not been seen since 07/14/20. Pt is progressing towards goals and has improved her LUE ROM and strength, grip and pinch strength, and coordination however has not  met any goals yet. Today's strength limited due to LUE elbow pain as well as weakness. Pt completing LUE ROM, functional reaching, grip/pinch, coordination tasks today. Verbal cuing for form and technique.    OT Occupational Profile and History Detailed Assessment- Review of Records and additional review of physical, cognitive, psychosocial history related to current functional performance    Occupational performance deficits (Please refer to evaluation for details): ADL's;IADL's;Work;Leisure    Body Structure / Function / Physical Skills ADL;Strength;Dexterity;Pain;UE functional use;IADL;ROM;Sensation;Coordination;FMC    Rehab Potential Good    Clinical Decision Making Several treatment options, min-mod task modification necessary    Comorbidities Affecting Occupational Performance: May have comorbidities impacting occupational performance    Modification or Assistance to Complete Evaluation  Min-Moderate modification of tasks or assist with assess necessary to complete eval    OT Frequency 2x / week    OT Duration 4 weeks    OT Treatment/Interventions Self-care/ADL training;Therapeutic exercise;Patient/family education;Neuromuscular education;Splinting;Moist Heat;Energy conservation;Therapeutic activities;Passive range of motion;Manual Therapy;DME and/or AE instruction;Ultrasound;Cryotherapy    Plan P: Pt will benefit from continued skilled OT services to improve LUE functional use during ADLs. Treatment plan: LUE strengthening, grip and pinch strengthening, coordination tasks, self-care training, modalities prn    OT Home Exercise Plan 1/26:  table slides and [facility] training 2/9: Sensory retraining exercises    Consulted and Agree with Plan of Care Patient           Patient will benefit from skilled therapeutic intervention in order to improve the following deficits and impairments:   Body Structure / Function / Physical Skills: ADL,Strength,Dexterity,Pain,UE functional  use,IADL,ROM,Sensation,Coordination,FMC       Visit Diagnosis: Other symptoms and signs involving the nervous system  Stiffness of left shoulder, not elsewhere classified  Other lack of coordination    Problem List Patient Active Problem List   Diagnosis Date Noted  . Chronic left-sided low back pain without sciatica 07/01/2020  . Abdominal pain 06/19/2020  . History of CVA with residual deficit 05/15/2020  . Neuropathic pain 05/06/2020  . Encounter for IUD insertion 04/20/2020  . Anxiety state   . Dyslipidemia   . Labile blood pressure   . Migraine without status migrainosus, not intractable   . Right thalamic infarction ([place]) 03/10/2020  . Left hemiparesis ([place]) 03/10/2020  . Recurrent falls 03/05/2020  . MDD (major depressive disorder), recurrent episode, moderate ([place]) 05/01/2017  . PTSD (post-traumatic stress disorder) 05/01/2017  . Depression, recurrent ([place]) 12/07/2016  . Essential hypertension 07/20/2016  . Uterine fibroid 01/27/2016  . Cervical high risk HPV (human papillomavirus) test positive 12/08/2014   Guadelupe Sabin, OTR/L  [phone] 07/31/2020, 1:03 PM  [facility] [address] [place], [place], [address] Phone: [phone]   Fax:  [phone]  Name: Joyce [name] MRN: [id number] Date of Birth: 1975/08/12

## 2020-07-31 NOTE — Therapy (Signed)
[place], [place], [address] Phone: [phone]   Fax:  [phone]  Physical Therapy Treatment, Progress note, Recertification  Patient Details  Name: Joyce [name] MRN: [id number] Date of Birth: [birth year] Referring Provider (PT): Lauraine Rinne PA Naaman Plummer, [name], MD)   Encounter Date: 07/31/2020   PT End of Session - 07/31/20 1049    Visit Number 13    Number of Visits 17    Date for PT Re-Evaluation 08/28/20    Authorization Type self -pay, [employer]-approved 8 visits approved from 06/12/20 to 07/29/20    Authorization - Visit Number 4    Authorization - Number of Visits 8    Progress Note Due on Visit 23    PT Start Time 1033    PT Stop Time 1115    PT Time Calculation (min) 42 min    Equipment Utilized During Treatment Gait belt    Activity Tolerance Patient limited by fatigue    Behavior During Therapy [facility] for tasks assessed/performed           Past Medical History:  Diagnosis Date  . Acute CVA (cerebrovascular accident) ([place]) 03/04/2020  . Acute CVA (cerebrovascular accident) ([place]) 03/04/2020  . Acute left-sided weakness 03/05/2020  . AMA (advanced maternal age) multigravida 35+ 11/12/2014  . Anemia   . Depression   . [place] multiparity 11/26/2014  . Hypertension   . Migraine   . Pregnant 11/12/2014  . PTSD (post-traumatic stress disorder) 05/01/2017  . Round ligament pain 11/12/2014  . Short of breath on exertion 11/12/2014  . Trichomonas infection     Past Surgical History:  Procedure Laterality Date  . CHOLECYSTECTOMY      There were no vitals filed for this visit.   Subjective Assessment - 07/31/20 1048    Subjective Pt reports having "dizzy spells" over the past few days and does not correlate any particular position or time of day that exacerbates these episodes              [facility] PT Assessment - 07/31/20 0001      Assessment   Medical Diagnosis Left Hemiplegia     Referring Provider (PT) Lauraine Rinne PA   Meredith Staggers, MD     Precautions   Precautions [facility] Adult PT Treatment/Exercise - 07/31/20 0001      Transfers   Transfers Sit to Stand;Stand to Sit    Stand to Sit 6: Modified independent (Device/Increase time);To elevated surface;With armrests      Ambulation/Gait   Ambulation/Gait Yes    Ambulation/Gait Assistance 6: Modified independent (Device/Increase time)    Ambulation Distance (Feet) 78 Feet   repeated 2MWT with ace wrap for DF assist on left and able to traverse distance of 120 ft with improved foot clearance and increased velocity   Assistive device 4-wheeled walker    Gait Pattern Decreased dorsiflexion - left;Decreased hip/knee flexion - left;Decreased stance time - left;Decreased stride length;Left foot flat    Ambulation Surface Level    Gait velocity decreased    Stairs Yes    Stairs Assistance 6: Modified independent (Device/Increase time)    Stair Management Technique Two rails;Step to pattern    Number of Stairs 4    Height of Stairs 6    Gait Comments 2MWT      Knee/Hip  Exercises: Aerobic   Nustep Nu-step level 2 x 10 min to improve activity tolerance and improve reciprocal motion                  PT Education - 07/31/20 1216    Education Details education and demonstration of device to improve left ankle dorsiflexion and exhibits increased gait speed as a result    Person(s) Educated Patient    Methods Explanation;Demonstration    Comprehension Verbalized understanding;Returned demonstration            PT Short Term Goals - 07/31/20 1217      PT SHORT TERM GOAL #1   Title Patient will report at least 25% improvement in overall symptoms and/or function to demonstrate improved functional mobility    Time 4    Period Weeks    Status Achieved    Target Date 04/29/20      PT SHORT TERM GOAL #2   Title Patient will be independent in self management  strategies to improve quality of life and functional outcomes.    Time 4    Period Weeks    Status On-going    Target Date 08/14/20      PT SHORT TERM GOAL #3   Title Patient will be able to transition from sit to stand without use of upper extremities to improve transitional mobility    Baseline UE support and use of arm rests or requires elevated seat height of 24" to perform without UE assist    Time 4    Period Weeks    Status On-going    Target Date 08/14/20             PT Long Term Goals - 07/31/20 1218      PT LONG TERM GOAL #1   Title Patient will report at least 50% improvement in overall symptoms and/or function to demonstrate improved functional mobility    Baseline Pt reports feeling 50% improved but still has fluctuating performance and level of assistance    Time 8    Period Weeks    Status Achieved      PT LONG TERM GOAL #2   Title Patient will be able to ascend and descend stairs with use of railing and step through gait pattern to improve ability to go up to bedroom    Baseline unable step through with heavy use of arms on the railing. Modified independence with two rails and step-to pattern    Time 8    Period Weeks    Status On-going    Target Date 08/28/20      PT LONG TERM GOAL #3   Title Patient will be able to ambulate at least 226 feet in 2 minutes without use of Assistive device to improve ability to walk in community    Baseline 80 ft with rollator and modified independent level surfaces with left foot drag, 120 ft with left ankle DF assist    Time 4    Period Weeks    Status On-going    Target Date 08/28/20                 Plan - 07/31/20 1112    Clinical Impression Statement Demonstrates improved gait velocity and gait mechanics with left ankle dorsiflexion assist.  Patient would likely benefit from orthotic or device intervention to improve left foot clearance during gait to improve velocity and decrease risk for falls.    Personal  Factors and Comorbidities Comorbidity 1;Comorbidity 2  Comorbidities migraines, HTN    Examination-Activity Limitations Locomotion Level;Lift;Transfers;Toileting;Stand;Stairs;Squat;Carry;Bathing    Examination-Participation Restrictions Cleaning;Community Activity;Meal Prep;Shop;Occupation    Stability/Clinical Decision Making Stable/Uncomplicated    Rehab Potential Good    PT Frequency --   1-2x/week for total of 12 visits over 8 week certification period   PT Duration 8 weeks    PT Treatment/Interventions ADLs/Self Care Home Management;Aquatic Therapy;Cryotherapy;Electrical Stimulation;Moist Heat;Traction;Balance training;Therapeutic exercise;Therapeutic activities;Functional mobility training;Stair training;Gait training;DME Instruction;Neuromuscular re-education;Patient/family education;Orthotic Fit/Training;Manual techniques;Energy conservation;Dry needling;Passive range of motion    PT Next Visit Plan Work on power, speed, alternating movements with greater speed/amplitude.  Trial ambulation with cane and CGA    PT Home Exercise Plan LAQs, DF 2/9 ab set, bridge, hip abd/add iso. 2/15 sit to stand from EOB    Consulted and Agree with Plan of Care Patient           Patient will benefit from skilled therapeutic intervention in order to improve the following deficits and impairments:  Decreased coordination,Improper body mechanics,Decreased range of motion,Decreased balance,Decreased mobility,Difficulty walking,Pain,Decreased strength,Decreased activity tolerance,Decreased knowledge of use of DME,Abnormal gait,Decreased endurance  Visit Diagnosis: Muscle weakness (generalized)  Other symptoms and signs involving the nervous system  Difficulty in walking, not elsewhere classified     Problem List Patient Active Problem List   Diagnosis Date Noted  . Chronic left-sided low back pain without sciatica 07/01/2020  . Abdominal pain 06/19/2020  . History of CVA with residual deficit  05/15/2020  . Neuropathic pain 05/06/2020  . Encounter for IUD insertion 04/20/2020  . Anxiety state   . Dyslipidemia   . Labile blood pressure   . Migraine without status migrainosus, not intractable   . Right thalamic infarction ([place]) 03/10/2020  . Left hemiparesis ([place]) 03/10/2020  . Recurrent falls 03/05/2020  . MDD (major depressive disorder), recurrent episode, moderate ([place]) 05/01/2017  . PTSD (post-traumatic stress disorder) 05/01/2017  . Depression, recurrent ([place]) 12/07/2016  . Essential hypertension 07/20/2016  . Uterine fibroid 01/27/2016  . Cervical high risk HPV (human papillomavirus) test positive 12/08/2014   12:23 PM, 07/31/20 M. Sherlyn Lees, PT, DPT Physical Therapist- [place] Office Number: [phone]  [facility] [address] [place], [place], [address] Phone: [phone]   Fax:  [phone]  Name: Joyce [name] MRN: [id number] Date of Birth: 07/23/1975

## 2020-08-04 ENCOUNTER — Other Ambulatory Visit: Payer: Self-pay

## 2020-08-04 ENCOUNTER — Encounter (HOSPITAL_COMMUNITY): Payer: Self-pay

## 2020-08-04 ENCOUNTER — Ambulatory Visit (HOSPITAL_COMMUNITY): Payer: Medicaid Other

## 2020-08-04 DIAGNOSIS — R29818 Other symptoms and signs involving the nervous system: Secondary | ICD-10-CM

## 2020-08-04 DIAGNOSIS — M25612 Stiffness of left shoulder, not elsewhere classified: Secondary | ICD-10-CM

## 2020-08-04 DIAGNOSIS — M6281 Muscle weakness (generalized): Secondary | ICD-10-CM

## 2020-08-04 DIAGNOSIS — R278 Other lack of coordination: Secondary | ICD-10-CM

## 2020-08-04 DIAGNOSIS — R262 Difficulty in walking, not elsewhere classified: Secondary | ICD-10-CM

## 2020-08-04 NOTE — Therapy (Signed)
[place], [place], [address] Phone: [phone]   Fax:  [phone]  Occupational Therapy Treatment  Patient Details  Name: Joyce [name] MRN: [id number] Date of Birth: 05-Jan-1976 Referring Provider (OT): Dr. Alger Simons   Encounter Date: 08/04/2020   OT End of Session - 08/04/20 1617    Visit Number 7    Number of Visits 14    Date for OT Re-Evaluation 08/30/20    Authorization Type [employer]    Authorization Time Period 12 visits approved from 06/24/20-08/05/20; requesting 7 additional visits    Authorization - Visit Number 5    Authorization - Number of Visits 12    OT Start Time 1036    OT Stop Time 1108    OT Time Calculation (min) 32 min    Activity Tolerance Patient tolerated treatment well    Behavior During Therapy [facility] for tasks assessed/performed           Past Medical History:  Diagnosis Date  . Acute CVA (cerebrovascular accident) ([place]) 03/04/2020  . Acute CVA (cerebrovascular accident) ([place]) 03/04/2020  . Acute left-sided weakness 03/05/2020  . AMA (advanced maternal age) multigravida 35+ 11/12/2014  . Anemia   . Depression   . [place] multiparity 11/26/2014  . Hypertension   . Migraine   . Pregnant 11/12/2014  . PTSD (post-traumatic stress disorder) 05/01/2017  . Round ligament pain 11/12/2014  . Short of breath on exertion 11/12/2014  . Trichomonas infection     Past Surgical History:  Procedure Laterality Date  . CHOLECYSTECTOMY      There were no vitals filed for this visit.   Subjective Assessment - 08/04/20 1040    Subjective  S: My elbow is ok. It's going down. I think it may be a pinched nerve.    Currently in Pain? No/denies              [facility] OT Assessment - 08/04/20 1621      Assessment   Medical Diagnosis Left Hemiplegia      Precautions   Precautions Fall                    OT Treatments/Exercises (OP) - 08/04/20 1044      Exercises    Exercises Hand;Shoulder      Shoulder Exercises: Therapy Ball   Other Therapy Ball Exercises green therapy ball strengthening: chest press, flexion, diagonals each direction; 10X each      Hand Exercises   Sponges Utilized green resistive clothespin in left hand with 3 point pinch to pinch open as much as possible to pick up 20 sponges and place in container.      Fine Motor Coordination (Hand/Wrist)   Fine Motor Coordination Grooved pegs    Grooved pegs Utilized tweezers in left hand (holding in functional tripod grasp) and placed pegs into pegboard. Removed using tweezers also.                    OT Short Term Goals - 06/26/20 1445      OT SHORT TERM GOAL #1   Title Patient will be educated and independent with HEP for improved functional use of LUE.    Time 3    Period Weeks    Status On-going    Target Date 07/15/20      OT SHORT TERM GOAL #2   Title Patient will improve LUE A/ROM to [facility] in order to use LUE  as an active assist with daily tasks.    Time 3    Period Weeks    Status On-going      OT SHORT TERM GOAL #3   Title Patient will improve LUE strength to 4/5 or better for increased ability to reach into overhead cabinet.    Time 3    Period Weeks    Status On-going             OT Long Term Goals - 06/26/20 1444      OT LONG TERM GOAL #1   Title Patient will complete B/IADLs, leisure tasks at highest level of I possible.    Time 6    Period Weeks    Status On-going      OT LONG TERM GOAL #2   Title Patient will improve left upper extremity grip strength to 15# or better and pinch strength to 7# or better for increased independence in opening containers.    Time 6    Period Weeks    Status On-going      OT LONG TERM GOAL #3   Title Patient will decrease completion time on nine hole peg test to 30" or less with left hand for improved coordination needed to tie shoes, braid hair, manipulate change.    Time 6    Period Weeks    Status On-going       OT LONG TERM GOAL #4   Title Patient will improve left hand sensation to [facility] with decreased pins and needles sensation.    Time 6    Period Weeks    Status On-going                 Plan - 08/04/20 1617    Clinical Impression Statement A: Modified pinch strengthening task as patient was unable to open the green resistice clothespin completely and the red resistance was too easy. Max difficulty with tweezer use during fine morot coordination when requested to hold tweezers in a tripod grasp which required more active ROM in the wrist to be able to place pegs correctly and required increased time. Muscle fatigue noted during therapy ball strengthening during diagonal movement of left shoulder to right hip. VC for form and technique were provided during session.    Body Structure / Function / Physical Skills ADL;Strength;Dexterity;Pain;UE functional use;IADL;ROM;Sensation;Coordination;FMC           Patient will benefit from skilled therapeutic intervention in order to improve the following deficits and impairments:   Body Structure / Function / Physical Skills: ADL,Strength,Dexterity,Pain,UE functional use,IADL,ROM,Sensation,Coordination,FMC       Visit Diagnosis: Other symptoms and signs involving the nervous system  Other lack of coordination  Stiffness of left shoulder, not elsewhere classified    Problem List Patient Active Problem List   Diagnosis Date Noted  . Chronic left-sided low back pain without sciatica 07/01/2020  . Abdominal pain 06/19/2020  . History of CVA with residual deficit 05/15/2020  . Neuropathic pain 05/06/2020  . Encounter for IUD insertion 04/20/2020  . Anxiety state   . Dyslipidemia   . Labile blood pressure   . Migraine without status migrainosus, not intractable   . Right thalamic infarction ([place]) 03/10/2020  . Left hemiparesis ([place]) 03/10/2020  . Recurrent falls 03/05/2020  . MDD (major depressive disorder), recurrent episode,  moderate ([place]) 05/01/2017  . PTSD (post-traumatic stress disorder) 05/01/2017  . Depression, recurrent ([place]) 12/07/2016  . Essential hypertension 07/20/2016  . Uterine fibroid 01/27/2016  . Cervical high  risk HPV (human papillomavirus) test positive 12/08/2014    Ailene Ravel, OTR/L,CBIS  [phone]  08/04/2020, 4:21 PM  [facility] [address] [place], [place], [address] Phone: [phone]   Fax:  [phone]  Name: TANAYSIA BHARDWAJ MRN: [id number] Date of Birth: [birth year]

## 2020-08-04 NOTE — Therapy (Signed)
[place], [place], [address] Phone: [phone]   Fax:  [phone]  Physical Therapy Treatment  Patient Details  Name: Joyce [name] MRN: [id number] Date of Birth: 10/15/1975 Referring Provider (PT): Lauraine Rinne PA Naaman Plummer, [name], MD)   Encounter Date: 08/04/2020   PT End of Session - 08/04/20 0955    Visit Number 14    Number of Visits 25    Date for PT Re-Evaluation 08/28/20    Authorization Type self -pay, [employer]-approved 8 visits approved from 06/12/20 to 07/29/20. Submitted for additional 8 on 07/30/20    Authorization - Visit Number 5    Authorization - Number of Visits 8    Progress Note Due on Visit 23    PT Start Time 0946    PT Stop Time 1029    PT Time Calculation (min) 43 min    Equipment Utilized During Treatment Gait belt    Activity Tolerance Patient limited by fatigue    Behavior During Therapy WFL for tasks assessed/performed           Past Medical History:  Diagnosis Date  . Acute CVA (cerebrovascular accident) ([facility]) 03/04/2020  . Acute CVA (cerebrovascular accident) ([facility]) 03/04/2020  . Acute left-sided weakness 03/05/2020  . AMA (advanced maternal age) multigravida 35+ 11/12/2014  . Anemia   . Depression   . [place] multiparity 11/26/2014  . Hypertension   . Migraine   . Pregnant 11/12/2014  . PTSD (post-traumatic stress disorder) 05/01/2017  . Round ligament pain 11/12/2014  . Short of breath on exertion 11/12/2014  . Trichomonas infection     Past Surgical History:  Procedure Laterality Date  . CHOLECYSTECTOMY      There were no vitals filed for this visit.   Subjective Assessment - 08/04/20 0957    Subjective Reports feeling better and no dizziness noted past few days and feeling more energized. Pt reports that her legs feel stronger and she has been able to increase her time walking at home    Pertinent History HTN, Migraine, PTSD, Acute CVA 03/04/20    Currently in  Pain? No/denies    Pain Score 0-No pain                             [facility] Adult PT Treatment/Exercise - 08/04/20 0001      Knee/Hip Exercises: Aerobic   Nustep Nu-step level 1 with 1 min at 60 SPM and 1 min slow, self-pace. Alternating up to 7 min      Knee/Hip Exercises: Standing   Knee Flexion Strengthening;2 sets;10 reps    Knee Flexion Limitations 5    Hip Flexion Stengthening;Both;2 sets   2 min stair taps   Hip Flexion Limitations marching alternating   5 lbs ankle weights   Other Standing Knee Exercises standing trunk twists left/right 1 kg ball 2x10      Knee/Hip Exercises: Seated   Long Arc Quad Strengthening;Both;2 sets;15 reps    Long Arc Quad Weight 5 lbs.                    PT Short Term Goals - 07/31/20 1217      PT SHORT TERM GOAL #1   Title Patient will report at least 25% improvement in overall symptoms and/or function to demonstrate improved functional mobility    Time 4    Period Weeks    Status Achieved  Target Date 04/29/20      PT SHORT TERM GOAL #2   Title Patient will be independent in self management strategies to improve quality of life and functional outcomes.    Time 4    Period Weeks    Status On-going    Target Date 08/14/20      PT SHORT TERM GOAL #3   Title Patient will be able to transition from sit to stand without use of upper extremities to improve transitional mobility    Baseline UE support and use of arm rests or requires elevated seat height of 24" to perform without UE assist    Time 4    Period Weeks    Status On-going    Target Date 08/14/20             PT Long Term Goals - 07/31/20 1218      PT LONG TERM GOAL #1   Title Patient will report at least 50% improvement in overall symptoms and/or function to demonstrate improved functional mobility    Baseline Pt reports feeling 50% improved but still has fluctuating performance and level of assistance    Time 8    Period Weeks    Status  Achieved      PT LONG TERM GOAL #2   Title Patient will be able to ascend and descend stairs with use of railing and step through gait pattern to improve ability to go up to bedroom    Baseline unable step through with heavy use of arms on the railing. Modified independence with two rails and step-to pattern    Time 8    Period Weeks    Status On-going    Target Date 08/28/20      PT LONG TERM GOAL #3   Title Patient will be able to ambulate at least 226 feet in 2 minutes without use of Assistive device to improve ability to walk in community    Baseline 80 ft with rollator and modified independent level surfaces with left foot drag, 120 ft with left ankle DF assist    Time 4    Period Weeks    Status On-going    Target Date 08/28/20                 Plan - 08/04/20 1011    Clinical Impression Statement Continues to exhibit difficulty with left foot clearance during gait and ambulates with slow labored pattern.  Activities today with emphasis on increasing speed and reciprocal motions with pt exhibiting difficulty especially with left foot clearance activities requiring increased step height.  Continued tx indicated to improve gait safety and velocity.  This therapist recently submitted request from MD for orthotic/foot drop intervention for LLE to minimize toe drag and improve gait velocity as evidenced by last week's 2MWT without device and repeated with ace wrap "AFO" which improved foot clearance and gait velocity. Difficulty with twisting motions with notable unsteadiness due to no UE support and LE quivering witnessed and pt requiring immediate stabilization via BUE    Personal Factors and Comorbidities Comorbidity 1;Comorbidity 2    Comorbidities migraines, HTN    Examination-Activity Limitations Locomotion Level;Lift;Transfers;Toileting;Stand;Stairs;Squat;Carry;Bathing    Examination-Participation Restrictions Cleaning;Community Activity;Meal Prep;Shop;Occupation     Stability/Clinical Decision Making Stable/Uncomplicated    Rehab Potential Good    PT Frequency --   1-2x/week for total of 12 visits over 8 week certification period   PT Duration 8 weeks    PT Treatment/Interventions ADLs/Self Care Home Management;Aquatic  Therapy;Cryotherapy;Electrical Stimulation;Moist Heat;Traction;Balance training;Therapeutic exercise;Therapeutic activities;Functional mobility training;Stair training;Gait training;DME Instruction;Neuromuscular re-education;Patient/family education;Orthotic Fit/Training;Manual techniques;Energy conservation;Dry needling;Passive range of motion    PT Next Visit Plan Work on power, speed, alternating movements with greater speed/amplitude.  Trial ambulation with cane and CGA    PT Home Exercise Plan LAQs, DF 2/9 ab set, bridge, hip abd/add iso. 2/15 sit to stand from EOB    Consulted and Agree with Plan of Care Patient           Patient will benefit from skilled therapeutic intervention in order to improve the following deficits and impairments:  Decreased coordination,Improper body mechanics,Decreased range of motion,Decreased balance,Decreased mobility,Difficulty walking,Pain,Decreased strength,Decreased activity tolerance,Decreased knowledge of use of DME,Abnormal gait,Decreased endurance  Visit Diagnosis: Muscle weakness (generalized)  Other symptoms and signs involving the nervous system  Difficulty in walking, not elsewhere classified     Problem List Patient Active Problem List   Diagnosis Date Noted  . Chronic left-sided low back pain without sciatica 07/01/2020  . Abdominal pain 06/19/2020  . History of CVA with residual deficit 05/15/2020  . Neuropathic pain 05/06/2020  . Encounter for IUD insertion 04/20/2020  . Anxiety state   . Dyslipidemia   . Labile blood pressure   . Migraine without status migrainosus, not intractable   . Right thalamic infarction ([facility]) 03/10/2020  . Left hemiparesis ([facility]) 03/10/2020  .  Recurrent falls 03/05/2020  . MDD (major depressive disorder), recurrent episode, moderate ([facility]) 05/01/2017  . PTSD (post-traumatic stress disorder) 05/01/2017  . Depression, recurrent ([facility]) 12/07/2016  . Essential hypertension 07/20/2016  . Uterine fibroid 01/27/2016  . Cervical high risk HPV (human papillomavirus) test positive 12/08/2014   10:30 AM, 08/04/20 M. Sherlyn Lees, PT, DPT Physical Therapist- [place] Office Number: [phone]  [facility] [address] [place], [place], [address] Phone: [phone]   Fax:  [phone]  Name: Joyce [name] MRN: [id number] Date of Birth: 05/17/1976

## 2020-08-06 ENCOUNTER — Ambulatory Visit: Payer: Medicaid Other | Admitting: Neurology

## 2020-08-06 ENCOUNTER — Encounter: Payer: Self-pay | Admitting: Neurology

## 2020-08-06 ENCOUNTER — Other Ambulatory Visit: Payer: Self-pay

## 2020-08-06 VITALS — BP 133/81 | HR 84 | Ht 65.0 in | Wt 230.0 lb

## 2020-08-06 DIAGNOSIS — I639 Cerebral infarction, unspecified: Secondary | ICD-10-CM | POA: Diagnosis not present

## 2020-08-06 DIAGNOSIS — Z9189 Other specified personal risk factors, not elsewhere classified: Secondary | ICD-10-CM | POA: Diagnosis not present

## 2020-08-06 DIAGNOSIS — R202 Paresthesia of skin: Secondary | ICD-10-CM

## 2020-08-06 DIAGNOSIS — I6381 Other cerebral infarction due to occlusion or stenosis of small artery: Secondary | ICD-10-CM

## 2020-08-06 MED ORDER — TOPIRAMATE 50 MG PO TABS: 50.0000 mg | ORAL_TABLET | Freq: Two times a day (BID) | ORAL | 3 refills | Status: DC

## 2020-08-06 NOTE — Patient Instructions (Signed)
I had a long d/w patient about her recent  Thalamic lacunar stroke, risk for recurrent stroke/TIAs, personally independently reviewed imaging studies and stroke evaluation results and answered questions.Continue aspirin 81 mg daily  for secondary stroke prevention and maintain strict control of hypertension with blood pressure goal below 130/90, diabetes with hemoglobin A1c goal below 6.5% and lipids with LDL cholesterol goal below 70 mg/dL. I also advised the patient to eat a healthy diet with plenty of whole grains, cereals, fruits and vegetables, exercise regularly and maintain ideal body weight.  I recommend she increase her Topamax to 50 mg daily for 1 week and then twice daily as tolerated to help with postop paresthesias and migraines.  Check follow-up lipid profile today.  Refer for polysomnogram for sleep apnea as she appears to be at risk for the same.  Continue ongoing physical occupational therapy.  Continue to use cane and walker for ambulation and safety.  Followup in the future with my nurse practitioner Janett Billow in 3 months or call earlier if necessary.  Stroke Prevention Some medical conditions and behaviors are associated with a higher chance of having a stroke. You can help prevent a stroke by making nutrition, lifestyle, and other changes, including managing any medical conditions you may have. What nutrition changes can be made?  Eat healthy foods. You can do this by: ? Choosing foods high in fiber, such as fresh fruits and vegetables and whole grains. ? Eating at least 5 or more servings of fruits and vegetables a day. Try to fill half of your plate at each meal with fruits and vegetables. ? Choosing lean protein foods, such as lean cuts of meat, poultry without skin, fish, tofu, beans, and nuts. ? Eating low-fat dairy products. ? Avoiding foods that are high in salt (sodium). This can help lower blood pressure. ? Avoiding foods that have saturated fat, trans fat, and cholesterol.  This can help prevent high cholesterol. ? Avoiding processed and premade foods.  Follow your health care provider's specific guidelines for losing weight, controlling high blood pressure (hypertension), lowering high cholesterol, and managing diabetes. These may include: ? Reducing your daily calorie intake. ? Limiting your daily sodium intake to 1,500 milligrams (mg). ? Using only healthy fats for cooking, such as olive oil, canola oil, or sunflower oil. ? Counting your daily carbohydrate intake.   What lifestyle changes can be made?  Maintain a healthy weight. Talk to your health care provider about your ideal weight.  Get at least 30 minutes of moderate physical activity at least 5 days a week. Moderate activity includes brisk walking, biking, and swimming.  Do not use any products that contain nicotine or tobacco, such as cigarettes and e-cigarettes. If you need help quitting, ask your health care provider. It may also be helpful to avoid exposure to secondhand smoke.  Limit alcohol intake to no more than 1 drink a day for nonpregnant women and 2 drinks a day for men. One drink equals 12 oz of beer, 5 oz of wine, or 1 oz of hard liquor.  Stop any illegal drug use.  Avoid taking birth control pills. Talk to your health care provider about the risks of taking birth control pills if: ? You are over 56 years old. ? You smoke. ? You get migraines. ? You have ever had a blood clot. What other changes can be made?  Manage your cholesterol levels. ? Eating a healthy diet is important for preventing high cholesterol. If cholesterol cannot be managed through  diet alone, you may also need to take medicines. ? Take any prescribed medicines to control your cholesterol as told by your health care provider.  Manage your diabetes. ? Eating a healthy diet and exercising regularly are important parts of managing your blood sugar. If your blood sugar cannot be managed through diet and exercise, you  may need to take medicines. ? Take any prescribed medicines to control your diabetes as told by your health care provider.  Control your hypertension. ? To reduce your risk of stroke, try to keep your blood pressure below 130/80. ? Eating a healthy diet and exercising regularly are an important part of controlling your blood pressure. If your blood pressure cannot be managed through diet and exercise, you may need to take medicines. ? Take any prescribed medicines to control hypertension as told by your health care provider. ? Ask your health care provider if you should monitor your blood pressure at home. ? Have your blood pressure checked every year, even if your blood pressure is normal. Blood pressure increases with age and some medical conditions.  Get evaluated for sleep disorders (sleep apnea). Talk to your health care provider about getting a sleep evaluation if you snore a lot or have excessive sleepiness.  Take over-the-counter and prescription medicines only as told by your health care provider. Aspirin or blood thinners (antiplatelets or anticoagulants) may be recommended to reduce your risk of forming blood clots that can lead to stroke.  Make sure that any other medical conditions you have, such as atrial fibrillation or atherosclerosis, are managed. What are the warning signs of a stroke? The warning signs of a stroke can be easily remembered as BEFAST.  B is for balance. Signs include: ? Dizziness. ? Loss of balance or coordination. ? Sudden trouble walking.  E is for eyes. Signs include: ? A sudden change in vision. ? Trouble seeing.  F is for face. Signs include: ? Sudden weakness or numbness of the face. ? The face or eyelid drooping to one side.  A is for arms. Signs include: ? Sudden weakness or numbness of the arm, usually on one side of the body.  S is for speech. Signs include: ? Trouble speaking (aphasia). ? Trouble understanding.  T is for  time. ? These symptoms may represent a serious problem that is an emergency. Do not wait to see if the symptoms will go away. Get medical help right away. Call your local emergency services (911 in the [place] Do not drive yourself to the hospital.  Other signs of stroke may include: ? A sudden, severe headache with no known cause. ? Nausea or vomiting. ? Seizure. Where to find more information For more information, visit:  American Stroke Association: www.strokeassociation.org  National Stroke Association: www.stroke.org Summary  You can prevent a stroke by eating healthy, exercising, not smoking, limiting alcohol intake, and managing any medical conditions you may have.  Do not use any products that contain nicotine or tobacco, such as cigarettes and e-cigarettes. If you need help quitting, ask your health care provider. It may also be helpful to avoid exposure to secondhand smoke.  Remember BEFAST for warning signs of stroke. Get help right away if you or a loved one has any of these signs. This information is not intended to replace advice given to you by your health care provider. Make sure you discuss any questions you have with your health care provider. Document Revised: 04/28/2017 Document Reviewed: 06/21/2016 Elsevier Patient Education  2021 [employer]  [employer].  

## 2020-08-06 NOTE — Progress Notes (Signed)
[facility] [address] [place]. [place] [address] [phone]       OFFICE CONSULT NOTE  [name] Date of Birth:  [birth year] Medical Record Number:  [id number]   Referring MD: Merrily Pew PA-C  Reason for Referral: Stroke HPI: [name] is a 45 year old pleasant African-American lady seen today for initial office consultation visit for stroke.  History is obtained from the patient, review of electronic medical records and I personally reviewed pertinent available imaging films in PACS.  She has past medical history of hypertension, hyperlipidemia, depression, migraines who presented initially initially on 03/04/2020 with sudden onset of left-sided weakness and numbness.  She presented outside time window for TPA. She was seen by telemetry specialist at [facility] and NIH stroke scale was 3.  MRI scan of the brain showed a small 8 mm right lateral thalamic acute lacunar infarct.  There are mild changes of small vessel disease.  MR angiogram of the brain showed no significant large vessel stenosis.  Carotid ultrasound showed no significant extracranial stenosis.  Transthoracic echo showed normal ejection fraction of 60 to 65% without cardiac source of embolism.  LDL cholesterol was 96 mg percent and hemoglobin A1c was 5.6.  ANA panel was negative antiphospholipid antibodies were negative.  Homocysteine level was normal.  RPR was negative.  ESR was normal.  Patient was started on aspirin Plavix for 3 weeks and subsequently switched to aspirin alone.  She did well with the regaining strength on the left side but has residual paresthesias still.  She is currently doing outpatient physical occupational therapy.  She returned to the ER on 05/19/2020 with headache as well as back pain.  Repeat MRI of the brain showed no acute abnormality.  Patient states she remains on aspirin she is tolerating well without bleeding or bruising.  Blood pressures well controlled  today it is 133/81.  She was on hormonal injections 3 times a week at the time of the stroke since then her gynecologist/changes to hormonal implant.  Her migraines are much improved and now occur around once a month or so.  She is on low-dose Topamax 25 mg daily for migraine prevention and tolerating it well without side effects.  She does admit to snoring and having disturbed sleep but she has never been evaluated for sleep apnea  ROS:   14 system review of systems is positive for back pain, difficulty walking, numbness, tingling and weakness in all other systems negative  PMH:  Past Medical History:  Diagnosis Date  . Acute CVA (cerebrovascular accident) ([place]) 03/04/2020  . Acute CVA (cerebrovascular accident) ([place]) 03/04/2020  . Acute left-sided weakness 03/05/2020  . AMA (advanced maternal age) multigravida 35+ 11/12/2014  . Anemia   . Depression   . [place] multiparity 11/26/2014  . Hypertension   . Migraine   . Pregnant 11/12/2014  . PTSD (post-traumatic stress disorder) 05/01/2017  . Round ligament pain 11/12/2014  . Short of breath on exertion 11/12/2014  . Trichomonas infection     Social History:  Social History   Socioeconomic History  . Marital status: Significant Other    Spouse name: [name]  . Number of children: 8  . Years of education: 36  . Highest education level: Not on file  Occupational History  . Occupation: unemployed    Comment: disabled  Tobacco Use  . Smoking status: Never Smoker  . Smokeless tobacco: Never Used  Vaping Use  . Vaping Use: Never used  Substance and Sexual  Activity  . Alcohol use: Not Currently    Comment: occasionally  . Drug use: No  . Sexual activity: Yes    Birth control/protection: I.U.D.    Comment: followed by GYN  Other Topics Concern  . Not on file  Social History Narrative   Lives with fiance and kids   Right Handed   Drinks >12 cans of soda in caffeine   Social Determinants of Health   Financial Resource Strain: Not on  file  Food Insecurity: Not on file  Transportation Needs: Not on file  Physical Activity: Not on file  Stress: Not on file  Social Connections: Not on file  Intimate Partner Violence: Not on file    Medications:   Current Outpatient Medications on File Prior to Visit  Medication Sig Dispense Refill  . ALPRAZolam (XANAX) 0.25 MG tablet Take 1 tablet (0.25 mg total) by mouth daily as needed for anxiety. 30 tablet 0  . amLODipine (NORVASC) 5 MG tablet Take 1 tablet (5 mg total) by mouth daily. 30 tablet 0  . aspirin 325 MG EC tablet Take 1 tablet (325 mg total) by mouth daily. 100 tablet 0  . atorvastatin (LIPITOR) 80 MG tablet Take 1 tablet (80 mg total) by mouth daily. 30 tablet 0  . clopidogrel (PLAVIX) 75 MG tablet Take 1 tablet (75 mg total) by mouth daily with breakfast. 30 tablet 0  . escitalopram (LEXAPRO) 20 MG tablet Take 1 tablet (20 mg total) by mouth daily. 20 tablet 0  . gabapentin (NEURONTIN) 300 MG capsule TAKE 1 CAPSULE BY MOUTH TWICE DAILY AND 2 CAPSULES EVERY NIGHT AT BEDTIME 120 capsule 1  . HYDROcodone-acetaminophen (NORCO/VICODIN) 5-325 MG tablet Take 1 tablet by mouth every 4 (four) hours as needed for moderate pain. 15 tablet 0  . ibuprofen (ADVIL) 600 MG tablet Take 1 tablet (600 mg total) by mouth every 6 (six) hours as needed. 30 tablet 0  . methocarbamol (ROBAXIN) 500 MG tablet Take 1 tablet (500 mg total) by mouth every 8 (eight) hours as needed for muscle spasms. 90 tablet 1  . pantoprazole (PROTONIX) 40 MG tablet Take 1 tablet (40 mg total) by mouth daily. 30 tablet 1  . PARAGARD INTRAUTERINE COPPER IU by Intrauterine route.    . traZODone (DESYREL) 100 MG tablet Take 100 mg by mouth at bedtime.     No current facility-administered medications on file prior to visit.    Allergies:  No Known Allergies  Physical Exam General: well developed, well nourished, seated, in no evident distress Head: head normocephalic and atraumatic.   Neck: supple with no  carotid or supraclavicular bruits Cardiovascular: regular rate and rhythm, no murmurs Musculoskeletal: no deformity.  Favors her back due to pain and spasm Skin:  no rash/petichiae Vascular:  Normal pulses all extremities  Neurologic Exam Mental Status: Awake and fully alert. Oriented to place and time. Recent and remote memory intact. Attention span, concentration and fund of knowledge appropriate. Mood and affect appropriate.  Cranial Nerves: Fundoscopic exam reveals sharp disc margins. Pupils equal, briskly reactive to light. Extraocular movements full without nystagmus. Visual fields full to confrontation. Hearing intact. Facial sensation intact. Face, tongue, palate moves normally and symmetrically.  Motor: Normal bulk and tone. Normal strength in all tested extremity muscles.  Diminished fine finger movements on the left.  Orbits right over left upper extremity. Sensory.:  Subjective diminished left lower face and upper and lower extremity pinprick , position and vibratory sensation.  Coordination: Rapid alternating movements normal  in all extremities. Finger-to-nose and heel-to-shin performed accurately bilaterally. Gait and Station: Arises from chair with  difficulty. Stance is antalgic and favoring her back.  Drags left leg due to pain gait demonstrates normal stride length and balance .  Reflexes: 1+ and symmetric. Toes downgoing.   NIHSS  1 Modified Rankin  2  ASSESSMENT: 45 year old African-American lady with right thalamic lacunar infarct in October 2021 secondary to small vessel disease with residual postop paresthesias.  Vascular risk factors of hypertension, hyperlipidemia, obesity and at risk for sleep apnea.  She also has longstanding history of migraines which appears suboptimally controlled on the present low-dose of Topamax.     PLAN: I had a long d/w patient about her recent  Thalamic lacunar stroke, risk for recurrent stroke/TIAs, personally independently reviewed  imaging studies and stroke evaluation results and answered questions.Continue aspirin 81 mg daily  for secondary stroke prevention and maintain strict control of hypertension with blood pressure goal below 130/90, diabetes with hemoglobin A1c goal below 6.5% and lipids with LDL cholesterol goal below 70 mg/dL. I also advised the patient to eat a healthy diet with plenty of whole grains, cereals, fruits and vegetables, exercise regularly and maintain ideal body weight.  I recommend she increase her Topamax to 50 mg daily for 1 week and then twice daily as tolerated to help with postop paresthesias and migraines.  Check follow-up lipid profile today.  Refer for polysomnogram for sleep apnea as she appears to be at risk for the same.  Continue ongoing physical occupational therapy.  Continue to use cane and walker for ambulation and safety.  Followup in the future with my nurse practitioner Janett Billow in 3 months or call earlier if necessary.  Greater than 50% time during this 45-minute consultation visit was spent on counseling and coordination of care about thalamic stroke and post stroke paresthesias. Antony Contras, MD Note: This document was prepared with digital dictation and possible smart phrase technology. Any transcriptional errors that result from this process are unintentional.

## 2020-08-07 ENCOUNTER — Telehealth (HOSPITAL_COMMUNITY): Payer: Self-pay

## 2020-08-07 ENCOUNTER — Ambulatory Visit (HOSPITAL_COMMUNITY): Payer: Medicaid Other

## 2020-08-07 LAB — LIPID PANEL
Chol/HDL Ratio: 2.7 ratio (ref 0.0–4.4)
Cholesterol, Total: 114 mg/dL (ref 100–199)
HDL: 43 mg/dL (ref >39)
LDL Chol Calc (NIH): 54 mg/dL (ref 0–99)
Triglycerides: 86 mg/dL (ref 0–149)
VLDL Cholesterol Cal: 17 mg/dL (ref 5–40)

## 2020-08-07 NOTE — Telephone Encounter (Signed)
pt's husband called to cx today's appts due to the pt is not feeling well

## 2020-08-11 ENCOUNTER — Ambulatory Visit (HOSPITAL_COMMUNITY): Payer: Medicaid Other

## 2020-08-11 ENCOUNTER — Telehealth (HOSPITAL_COMMUNITY): Payer: Self-pay

## 2020-08-11 NOTE — Telephone Encounter (Signed)
Pt is taken her daughter to the ED and wil not be here today

## 2020-08-14 ENCOUNTER — Ambulatory Visit (HOSPITAL_COMMUNITY): Payer: Medicaid Other

## 2020-08-14 ENCOUNTER — Encounter (HOSPITAL_COMMUNITY): Payer: Self-pay

## 2020-08-14 ENCOUNTER — Other Ambulatory Visit: Payer: Self-pay

## 2020-08-14 DIAGNOSIS — R29818 Other symptoms and signs involving the nervous system: Secondary | ICD-10-CM | POA: Diagnosis not present

## 2020-08-14 DIAGNOSIS — M25612 Stiffness of left shoulder, not elsewhere classified: Secondary | ICD-10-CM

## 2020-08-14 DIAGNOSIS — R278 Other lack of coordination: Secondary | ICD-10-CM

## 2020-08-14 DIAGNOSIS — M6281 Muscle weakness (generalized): Secondary | ICD-10-CM

## 2020-08-14 DIAGNOSIS — R262 Difficulty in walking, not elsewhere classified: Secondary | ICD-10-CM

## 2020-08-14 NOTE — Therapy (Signed)
[facility] [place], [place], [address] Phone: [phone]   Fax:  [phone]  Physical Therapy Treatment  Patient Details  Name: Joyce [name] MRN: [id number] Date of Birth: 1975/08/16 Referring Provider (PT): Lauraine Rinne PA Naaman Plummer, [name], MD)   Encounter Date: 08/14/2020   PT End of Session - 08/14/20 1023    Visit Number 15    Number of Visits 25    Date for PT Re-Evaluation 08/28/20    Authorization Type self -pay, [employer]-approved 8 visits approved from 06/12/20 to 07/29/20. Submitted for additional 8 on 07/30/20    Authorization - Visit Number 6    Authorization - Number of Visits 8    Progress Note Due on Visit 23    PT Start Time 1018    PT Stop Time 1108    PT Time Calculation (min) 50 min    Equipment Utilized During Treatment Gait belt    Activity Tolerance Patient limited by fatigue    Behavior During Therapy WFL for tasks assessed/performed           Past Medical History:  Diagnosis Date  . Acute CVA (cerebrovascular accident) ([place]) 03/04/2020  . Acute CVA (cerebrovascular accident) ([place]) 03/04/2020  . Acute left-sided weakness 03/05/2020  . AMA (advanced maternal age) multigravida 35+ 11/12/2014  . Anemia   . Depression   . [place] multiparity 11/26/2014  . Hypertension   . Migraine   . Pregnant 11/12/2014  . PTSD (post-traumatic stress disorder) 05/01/2017  . Round ligament pain 11/12/2014  . Short of breath on exertion 11/12/2014  . Trichomonas infection     Past Surgical History:  Procedure Laterality Date  . CHOLECYSTECTOMY      There were no vitals filed for this visit.   Subjective Assessment - 08/14/20 1021    Subjective Pt reports she is feeling better. No new complaints.  Continues to walk with slow and laborious pattern with decreased foot clearance LLE during transfers and gait with occasional toe drag and limited step length due to left foot drop/weakness in dorsiflexion     Currently in Pain? No/denies    Pain Score 0-No pain              [facility] PT Assessment - 08/14/20 0001      Assessment   Medical Diagnosis Left Hemiplegia                         [facility] Adult PT Treatment/Exercise - 08/14/20 0001      Transfers   Transfers Sit to Stand;Stand to Sit;Stand Pivot Transfers    Stand Pivot Transfers 6: Modified independent (Device/Increase time)   decreased left foot clearance during turning/pivoting with left foot drag     Ambulation/Gait   Ambulation/Gait Yes    Ambulation/Gait Assistance 6: Modified independent (Device/Increase time)    Ambulation Distance (Feet) 100 Feet   for 3 trials with cues and techniuqes to facilitate left foot clearance (tapping, kinesiotape)   Assistive device 4-wheeled walker    Gait Pattern Decreased dorsiflexion - left;Decreased weight shift to left;Poor foot clearance - left    Ambulation Surface Level;Indoor    Gait velocity decreased    Stairs Yes    Stairs Assistance 6: Modified independent (Device/Increase time)    Stair Management Technique Two rails;Step to pattern    Number of Stairs 4    Height of Stairs 6      Knee/Hip Exercises:  Aerobic   Nustep Nu-step level 5 x 8 min with 15 sec hard effort 45 sec light      Knee/Hip Exercises: Standing   Hip Flexion Stengthening;Left   2 min stair taps on 6" step     Knee/Hip Exercises: Seated   Long Arc Quad Strengthening;Both;3 sets;10 reps    Long Arc Quad Weight 5 lbs.   5 lbs LLE, 20 lbs RLE   Ball Squeeze 3x10, 2 sec hold                  PT Education - 08/14/20 1045    Education Details education on use and benefits of device to improve left ankle dorsiflexion during gait and transfers to improve stability, speed, and safety to reduce risk for falls    Person(s) Educated Patient    Methods Explanation    Comprehension Verbalized understanding            PT Short Term Goals - 07/31/20 1217      PT SHORT TERM GOAL #1    Title Patient will report at least 25% improvement in overall symptoms and/or function to demonstrate improved functional mobility    Time 4    Period Weeks    Status Achieved    Target Date 04/29/20      PT SHORT TERM GOAL #2   Title Patient will be independent in self management strategies to improve quality of life and functional outcomes.    Time 4    Period Weeks    Status On-going    Target Date 08/14/20      PT SHORT TERM GOAL #3   Title Patient will be able to transition from sit to stand without use of upper extremities to improve transitional mobility    Baseline UE support and use of arm rests or requires elevated seat height of 24" to perform without UE assist    Time 4    Period Weeks    Status On-going    Target Date 08/14/20             PT Long Term Goals - 07/31/20 1218      PT LONG TERM GOAL #1   Title Patient will report at least 50% improvement in overall symptoms and/or function to demonstrate improved functional mobility    Baseline Pt reports feeling 50% improved but still has fluctuating performance and level of assistance    Time 8    Period Weeks    Status Achieved      PT LONG TERM GOAL #2   Title Patient will be able to ascend and descend stairs with use of railing and step through gait pattern to improve ability to go up to bedroom    Baseline unable step through with heavy use of arms on the railing. Modified independence with two rails and step-to pattern    Time 8    Period Weeks    Status On-going    Target Date 08/28/20      PT LONG TERM GOAL #3   Title Patient will be able to ambulate at least 226 feet in 2 minutes without use of Assistive device to improve ability to walk in community    Baseline 80 ft with rollator and modified independent level surfaces with left foot drag, 120 ft with left ankle DF assist    Time 4    Period Weeks    Status On-going    Target Date 08/28/20  Plan - 08/14/20 1046     Clinical Impression Statement Patient continues to exhibit LLE weakness and altered gait kinematics with left foot demonstrating decreased clearance during swing phase, decreased step length, and difficulty with achieving requisite left foot clearance with gait on level surfaces and requiring step-to pattern for stair ambulation and bilateral HR.  Patient would benefit from device/orthotic intervention for left foot/ankle to improve independence and safety with transfers, gait, and stair ambulation.    Personal Factors and Comorbidities Comorbidity 1;Comorbidity 2    Comorbidities migraines, HTN    Examination-Activity Limitations Locomotion Level;Lift;Transfers;Toileting;Stand;Stairs;Squat;Carry;Bathing    Examination-Participation Restrictions Cleaning;Community Activity;Meal Prep;Shop;Occupation    Stability/Clinical Decision Making Stable/Uncomplicated    Rehab Potential Good    PT Frequency --   1-2x/week for total of 12 visits over 8 week certification period   PT Duration 8 weeks    PT Treatment/Interventions ADLs/Self Care Home Management;Aquatic Therapy;Cryotherapy;Electrical Stimulation;Moist Heat;Traction;Balance training;Therapeutic exercise;Therapeutic activities;Functional mobility training;Stair training;Gait training;DME Instruction;Neuromuscular re-education;Patient/family education;Orthotic Fit/Training;Manual techniques;Energy conservation;Dry needling;Passive range of motion    PT Next Visit Plan Work on power, speed, alternating movements with greater speed/amplitude.  Trial ambulation with cane and CGA    PT Home Exercise Plan LAQs, DF 2/9 ab set, bridge, hip abd/add iso. 2/15 sit to stand from EOB    Consulted and Agree with Plan of Care Patient           Patient will benefit from skilled therapeutic intervention in order to improve the following deficits and impairments:  Decreased coordination,Improper body mechanics,Decreased range of motion,Decreased balance,Decreased  mobility,Difficulty walking,Pain,Decreased strength,Decreased activity tolerance,Decreased knowledge of use of DME,Abnormal gait,Decreased endurance  Visit Diagnosis: Muscle weakness (generalized)  Other symptoms and signs involving the nervous system  Difficulty in walking, not elsewhere classified  Other lack of coordination     Problem List Patient Active Problem List   Diagnosis Date Noted  . Chronic left-sided low back pain without sciatica 07/01/2020  . Abdominal pain 06/19/2020  . History of CVA with residual deficit 05/15/2020  . Neuropathic pain 05/06/2020  . Encounter for IUD insertion 04/20/2020  . Anxiety state   . Dyslipidemia   . Labile blood pressure   . Migraine without status migrainosus, not intractable   . Right thalamic infarction ([place]) 03/10/2020  . Left hemiparesis ([place]) 03/10/2020  . Recurrent falls 03/05/2020  . MDD (major depressive disorder), recurrent episode, moderate ([place]) 05/01/2017  . PTSD (post-traumatic stress disorder) 05/01/2017  . Depression, recurrent ([place]) 12/07/2016  . Essential hypertension 07/20/2016  . Uterine fibroid 01/27/2016  . Cervical high risk HPV (human papillomavirus) test positive 12/08/2014   11:13 AM, 08/14/20 M. Sherlyn Lees, PT, DPT Physical Therapist- [facility] Office Number: [phone]  [facility] [address] [place], [place], [address] Phone: [phone]   Fax:  [phone]  Name: Joyce [name] MRN: [id number] Date of Birth: 24-Oct-1975

## 2020-08-14 NOTE — Therapy (Signed)
[facility] [address] [place], [place], [address] Phone: [phone]   Fax:  [phone]  Occupational Therapy Treatment  Patient Details  Name: Joyce [name] MRN: [id number] Date of Birth: 1975-10-19 Referring Provider (OT): Dr. Alger Simons   Encounter Date: 08/14/2020   OT End of Session - 08/14/20 1214    Visit Number 8    Number of Visits 14    Date for OT Re-Evaluation 08/30/20    Authorization Type [employer]    Authorization Time Period 12 visits approved from 06/24/20-08/05/20; requesting 7 additional visits    Authorization - Visit Number 1    Authorization - Number of Visits 7    OT Start Time 1115    OT Stop Time 1200    OT Time Calculation (min) 45 min    Activity Tolerance Patient tolerated treatment well    Behavior During Therapy [facility] for tasks assessed/performed           Past Medical History:  Diagnosis Date  . Acute CVA (cerebrovascular accident) ([place]) 03/04/2020  . Acute CVA (cerebrovascular accident) ([place]) 03/04/2020  . Acute left-sided weakness 03/05/2020  . AMA (advanced maternal age) multigravida 35+ 11/12/2014  . Anemia   . Depression   . [place] multiparity 11/26/2014  . Hypertension   . Migraine   . Pregnant 11/12/2014  . PTSD (post-traumatic stress disorder) 05/01/2017  . Round ligament pain 11/12/2014  . Short of breath on exertion 11/12/2014  . Trichomonas infection     Past Surgical History:  Procedure Laterality Date  . CHOLECYSTECTOMY      There were no vitals filed for this visit.   Subjective Assessment - 08/14/20 1149    Subjective  S: My elbow is still not 100%.    Currently in Pain? No/denies              [facility] OT Assessment - 08/14/20 1212      Assessment   Medical Diagnosis Left Hemiplegia      Precautions   Precautions Fall                    OT Treatments/Exercises (OP) - 08/14/20 1133      Exercises   Exercises Hand;Shoulder      Shoulder  Exercises: Supine   Protraction AROM;5 reps;Strengthening;10 reps    Protraction Weight (lbs) 1    Horizontal ABduction AROM;5 reps;Strengthening;10 reps    Horizontal ABduction Weight (lbs) 1    External Rotation AROM;5 reps;Strengthening;10 reps    External Rotation Weight (lbs) 1    Internal Rotation AROM;5 reps;Strengthening;10 reps    Internal Rotation Weight (lbs) 1    Flexion AROM;5 reps;Strengthening;10 reps    Shoulder Flexion Weight (lbs) 1    ABduction AROM;5 reps;Strengthening;10 reps    Shoulder ABduction Weight (lbs) 1      Shoulder Exercises: Therapy Ball   Other Therapy Ball Exercises green therapy ball strengthening: chest press, flexion 10X each   semi reclined position     Shoulder Exercises: ROM/Strengthening   Proximal Shoulder Strengthening, Supine 5X with1# semi reclined position    Proximal Shoulder Strengthening, Seated Using washcloth on door at shoulder level; 30" 2 sets      Hand Exercises   Other Hand Exercises Using bilateral hands, patient pulled large container of red theraputty apart into two separate pieces. Putty was flatten using both hands. Using pvc pipe in left hand, patient pressed circles focusing on grip strength, wrist strength, and  shoulder strength.                  OT Education - 08/14/20 1213    Education Details Dycem provided with education on use to decrease difficulty when opening jars or containers. Demonstration provided on use and technique.    Person(s) Educated Patient    Methods Explanation;Demonstration    Comprehension Verbalized understanding            OT Short Term Goals - 06/26/20 1445      OT SHORT TERM GOAL #1   Title Patient will be educated and independent with HEP for improved functional use of LUE.    Time 3    Period Weeks    Status On-going    Target Date 07/15/20      OT SHORT TERM GOAL #2   Title Patient will improve LUE A/ROM to [facility] in order to use LUE as an active assist with daily tasks.     Time 3    Period Weeks    Status On-going      OT SHORT TERM GOAL #3   Title Patient will improve LUE strength to 4/5 or better for increased ability to reach into overhead cabinet.    Time 3    Period Weeks    Status On-going             OT Long Term Goals - 06/26/20 1444      OT LONG TERM GOAL #1   Title Patient will complete B/IADLs, leisure tasks at highest level of I possible.    Time 6    Period Weeks    Status On-going      OT LONG TERM GOAL #2   Title Patient will improve left upper extremity grip strength to 15# or better and pinch strength to 7# or better for increased independence in opening containers.    Time 6    Period Weeks    Status On-going      OT LONG TERM GOAL #3   Title Patient will decrease completion time on nine hole peg test to 30" or less with left hand for improved coordination needed to tie shoes, braid hair, manipulate change.    Time 6    Period Weeks    Status On-going      OT LONG TERM GOAL #4   Title Patient will improve left hand sensation to [facility] with decreased pins and needles sensation.    Time 6    Period Weeks    Status On-going                 Plan - 08/14/20 1215    Clinical Impression Statement A: Focused on shoulder strength and stability while in a semi reclined position for better posture and support. Able to complete with improved form and technique. Continues to have decrease shoulder stability and strength with muscle fatigue pressent. VC for form and technique. Complete hand strengthening task using PVC pipe and putty to increase ability to open jars and containers. Provided dycem to assist with task at home. Discussed insurance visit coverage and visit limit. Will schedule remaining OT and PT visits per insurance coverage and provide updated schedule at next session.    Body Structure / Function / Physical Skills ADL;Strength;Dexterity;Pain;UE functional use;IADL;ROM;Sensation;Coordination;FMC    Plan P: Update  HEP. Print new schedule. Continue to work on hand strength and shoulder stabilility.    Consulted and Agree with Plan of Care Patient  Patient will benefit from skilled therapeutic intervention in order to improve the following deficits and impairments:   Body Structure / Function / Physical Skills: ADL,Strength,Dexterity,Pain,UE functional use,IADL,ROM,Sensation,Coordination,FMC       Visit Diagnosis: Other symptoms and signs involving the nervous system  Other lack of coordination  Stiffness of left shoulder, not elsewhere classified    Problem List Patient Active Problem List   Diagnosis Date Noted  . Chronic left-sided low back pain without sciatica 07/01/2020  . Abdominal pain 06/19/2020  . History of CVA with residual deficit 05/15/2020  . Neuropathic pain 05/06/2020  . Encounter for IUD insertion 04/20/2020  . Anxiety state   . Dyslipidemia   . Labile blood pressure   . Migraine without status migrainosus, not intractable   . Right thalamic infarction ([place]) 03/10/2020  . Left hemiparesis ([place]) 03/10/2020  . Recurrent falls 03/05/2020  . MDD (major depressive disorder), recurrent episode, moderate ([place]) 05/01/2017  . PTSD (post-traumatic stress disorder) 05/01/2017  . Depression, recurrent ([place]) 12/07/2016  . Essential hypertension 07/20/2016  . Uterine fibroid 01/27/2016  . Cervical high risk HPV (human papillomavirus) test positive 12/08/2014    Ailene Ravel, OTR/L,CBIS  [phone]  08/14/2020, 12:40 PM  [place], [place], [address] Phone: [phone]   Fax:  [phone]  Name: Joyce [name] MRN: [id number] Date of Birth: 08-03-1975

## 2020-08-16 NOTE — Progress Notes (Signed)
Kindly inform the patient that cholesterol profile was all satisfactory

## 2020-08-18 ENCOUNTER — Encounter (HOSPITAL_COMMUNITY): Payer: Self-pay

## 2020-08-18 ENCOUNTER — Ambulatory Visit (HOSPITAL_COMMUNITY): Payer: Medicaid Other

## 2020-08-18 ENCOUNTER — Encounter: Payer: Self-pay | Admitting: *Deleted

## 2020-08-18 DIAGNOSIS — F331 Major depressive disorder, recurrent, moderate: Secondary | ICD-10-CM | POA: Diagnosis not present

## 2020-08-18 DIAGNOSIS — F410 Panic disorder [episodic paroxysmal anxiety] without agoraphobia: Secondary | ICD-10-CM | POA: Diagnosis not present

## 2020-08-18 DIAGNOSIS — F411 Generalized anxiety disorder: Secondary | ICD-10-CM | POA: Diagnosis not present

## 2020-08-19 ENCOUNTER — Other Ambulatory Visit: Payer: Self-pay

## 2020-08-19 ENCOUNTER — Ambulatory Visit: Payer: Medicaid Other | Admitting: Gastroenterology

## 2020-08-19 ENCOUNTER — Encounter: Payer: Self-pay | Admitting: Gastroenterology

## 2020-08-19 VITALS — BP 142/79 | HR 81 | Temp 97.5°F | Ht 65.0 in | Wt 240.4 lb

## 2020-08-19 DIAGNOSIS — R1030 Lower abdominal pain, unspecified: Secondary | ICD-10-CM

## 2020-08-19 DIAGNOSIS — R197 Diarrhea, unspecified: Secondary | ICD-10-CM | POA: Diagnosis not present

## 2020-08-19 MED ORDER — DICYCLOMINE HCL 10 MG PO CAPS
10.0000 mg | ORAL_CAPSULE | Freq: Three times a day (TID) | ORAL | 3 refills | Status: DC
Start: 2020-08-19 — End: 2021-10-06

## 2020-08-19 NOTE — Progress Notes (Signed)
Primary Care Physician:  Noreene Larsson, NP  Referring Physician: Demetrius Revel, NP Primary Gastroenterologist:  Dr. Abbey Chatters  Chief Complaint  Patient presents with  . Abdominal Pain    Lower abd. X3 months    HPI:   Joyce [name] is a 45 y.o. female presenting today at the request of Demetrius Revel, NP, due to abdominal pain. US abdomen complete in Feb 2022 with absent gallbladder, no biliary dilation.   Lower abdominal pain since Feb 2022, following bout with Covid in Jan 2022. Has diarrhea at least 2-3 times per day. Prior to Covid, would have a BM once per day. Pain is intermittent, sometimes relieved with diarrhea and sometimes not. Hot compress helps to relieve. Sleeps with a heating pad. Feels like a crampy, gassy pain. Nothing triggers it. No rectal bleeding.  No recent antibiotics. No new medications. Drinks bottled water. No sick contacts. No fever/chills. Appetite is fair. No weight loss.   History of stroke in Oct 2021 with left-sided weakness. Uses walker to ambulate.   Past Medical History:  Diagnosis Date  . Acute CVA (cerebrovascular accident) ([place]) 03/04/2020  . Acute CVA (cerebrovascular accident) ([place]) 03/04/2020  . Acute left-sided weakness 03/05/2020  . AMA (advanced maternal age) multigravida 35+ 11/12/2014  . Anemia   . Depression   . [place] multiparity 11/26/2014  . Hypertension   . Migraine   . Pregnant 11/12/2014  . PTSD (post-traumatic stress disorder) 05/01/2017  . Round ligament pain 11/12/2014  . Short of breath on exertion 11/12/2014  . Trichomonas infection     Past Surgical History:  Procedure Laterality Date  . CHOLECYSTECTOMY      Current Outpatient Medications  Medication Sig Dispense Refill  . ALPRAZolam (XANAX) 0.25 MG tablet Take 1 tablet (0.25 mg total) by mouth daily as needed for anxiety. 30 tablet 0  . amLODipine (NORVASC) 5 MG tablet Take 1 tablet (5 mg total) by mouth daily. 30 tablet 0  . aspirin 325 MG EC tablet Take 1 tablet (325  mg total) by mouth daily. 100 tablet 0  . atorvastatin (LIPITOR) 80 MG tablet Take 1 tablet (80 mg total) by mouth daily. 30 tablet 0  . dicyclomine (BENTYL) 10 MG capsule Take 1 capsule (10 mg total) by mouth 4 (four) times daily -  before meals and at bedtime. Stop if constipated 120 capsule 3  . escitalopram (LEXAPRO) 20 MG tablet Take 1 tablet (20 mg total) by mouth daily. 20 tablet 0  . gabapentin (NEURONTIN) 300 MG capsule TAKE 1 CAPSULE BY MOUTH TWICE DAILY AND 2 CAPSULES EVERY NIGHT AT BEDTIME 120 capsule 1  . ibuprofen (ADVIL) 600 MG tablet Take 1 tablet (600 mg total) by mouth every 6 (six) hours as needed. 30 tablet 0  . methocarbamol (ROBAXIN) 500 MG tablet Take 1 tablet (500 mg total) by mouth every 8 (eight) hours as needed for muscle spasms. 90 tablet 1  . pantoprazole (PROTONIX) 40 MG tablet Take 1 tablet (40 mg total) by mouth daily. 30 tablet 1  . PARAGARD INTRAUTERINE COPPER IU by Intrauterine route.    . topiramate (TOPAMAX) 50 MG tablet Take 1 tablet (50 mg total) by mouth 2 (two) times daily. Start 1 tablet daily x 1 week and then twice daily 60 tablet 3  . traZODone (DESYREL) 100 MG tablet Take 100 mg by mouth at bedtime.     No current facility-administered medications for this visit.    Allergies as of 08/19/2020  . (No  Known Allergies)    Family History  Problem Relation Age of Onset  . Diabetes Mother   . Hypertension Mother   . Kidney disease Mother   . Asthma Mother   . Hyperlipidemia Mother   . Alzheimer's disease Father   . Diabetes Father   . Kidney disease Sister   . Asthma Son   . Diabetes Sister   . Asthma Son   . ADD / ADHD Son   . Heart murmur Son   . Asthma Son   . Colon cancer Neg Hx   . Colon polyps Neg Hx     Social History   Socioeconomic History  . Marital status: Significant Other    Spouse name: [name]  . Number of children: 8  . Years of education: 80  . Highest education level: Not on file  Occupational History  .  Occupation: unemployed    Comment: disabled  Tobacco Use  . Smoking status: Never Smoker  . Smokeless tobacco: Never Used  Vaping Use  . Vaping Use: Never used  Substance and Sexual Activity  . Alcohol use: Not Currently    Comment: occasionally  . Drug use: No  . Sexual activity: Yes    Birth control/protection: I.U.D.    Comment: followed by GYN  Other Topics Concern  . Not on file  Social History Narrative   Lives with fiance and kids   Right Handed   Drinks >12 cans of soda in caffeine   Social Determinants of Health   Financial Resource Strain: Not on file  Food Insecurity: Not on file  Transportation Needs: Not on file  Physical Activity: Not on file  Stress: Not on file  Social Connections: Not on file  Intimate Partner Violence: Not on file    Review of Systems: Gen: Denies any fever, chills, fatigue, weight loss, lack of appetite.  CV: Denies chest pain, heart palpitations, peripheral edema, syncope.  Resp: Denies shortness of breath at rest or with exertion. Denies wheezing or cough.  GI: see HPI GU : Denies urinary burning, urinary frequency, urinary hesitancy MS: Denies joint pain, muscle weakness, cramps, or limitation of movement.  Derm: Denies rash, itching, dry skin Psych: Denies depression, anxiety, memory loss, and confusion Heme: Denies bruising, bleeding, and enlarged lymph nodes.  Physical Exam: BP (!) 142/79   Pulse 81   Temp (!) 97.5 F (36.4 C) (Temporal)   Ht 5\' 5"  (1.651 m)   Wt 240 lb 6.4 oz (109 kg)   BMI 40.00 kg/m  General:   Alert and oriented. Pleasant and cooperative. Well-nourished and well-developed.  Head:  Normocephalic and atraumatic. Eyes:  Without icterus, sclera clear and conjunctiva pink.  Ears:  Normal auditory acuity. Mouth:  Mask in place Lungs:  Clear to auscultation bilaterally. No wheezes, rales, or rhonchi. No distress.  Heart:  S1, S2 present without murmurs appreciated.  Abdomen:  +BS, soft, mild TTP supra  umbilically and non-distended. No HSM noted. No guarding or rebound. No masses appreciated.  Rectal:  Deferred  Msk:  Symmetrical without gross deformities. Left-sided weakness left lower extremity but ambulates with mild assistance Extremities:  Without edema. Neurologic:  Alert and  oriented x4;  grossly normal neurologically. Skin:  Intact without significant lesions or rashes. Psych:  Alert and cooperative. Normal mood and affect.  Lab Results  Component Value Date   WBC 10.0 06/18/2020   HGB 15.3 06/18/2020   HCT 46.5 06/18/2020   MCV 80 06/18/2020   PLT 275 06/18/2020  Lab Results  Component Value Date   ALT 23 06/18/2020   AST 21 06/18/2020   ALKPHOS 69 06/18/2020   BILITOT 0.8 06/18/2020     ASSESSMENT: MAKENZY KRIST is a 45 y.o. female presenting today with diarrhea onset shortly after Covid in Jan 2022, suspicious for a post-infectious IBS presentation. Notably, majority of her Covid symptoms were GI-related (N/V, diarrhea).   No overt GI bleeding or alarm signs/symptoms. Lower abdominal discomfort at times related to diarrhea. No acute findings on physical exam. Doubt infectious process but stool studies ordered today to exclude. Will trial dicyclomine in interim for supportive measures.    PLAN: Stool studies Dicyclomine up to 4 times per day Call if worsening abdominal discomfort  Return for close follow-up in 6-8 weeks   Annitta Needs, PhD, ANP-BC [facility] Gastroenterology

## 2020-08-19 NOTE — Patient Instructions (Signed)
Please have stool studies done. I doubt you have an infectious process going on, but we need to rule this out.  I have sent in dicyclomine to take up to 4 times a day (before meals and at bedtime). This can cause constipation, so please stop it if you get constipated. I would start off slow taking it just twice a day.  We will see you in 6-8 weeks! Please call with any worsening symptoms or pain.  It was a pleasure to see you today. I want to create trusting relationships with patients to provide genuine, compassionate, and quality care. I value your feedback. If you receive a survey regarding your visit,  I greatly appreciate you taking time to fill this out.   Annitta Needs, PhD, ANP-BC [facility] Gastroenterology

## 2020-08-21 ENCOUNTER — Encounter (HOSPITAL_COMMUNITY): Payer: Self-pay

## 2020-08-21 ENCOUNTER — Ambulatory Visit (HOSPITAL_COMMUNITY): Payer: Medicaid Other

## 2020-08-25 ENCOUNTER — Encounter: Payer: Self-pay | Admitting: Gastroenterology

## 2020-08-25 NOTE — Progress Notes (Signed)
CC'ED TO PCP 

## 2020-09-02 ENCOUNTER — Other Ambulatory Visit: Payer: Self-pay

## 2020-09-02 ENCOUNTER — Encounter: Payer: Medicaid Other | Attending: Physical Medicine & Rehabilitation | Admitting: Physical Medicine & Rehabilitation

## 2020-09-02 ENCOUNTER — Encounter: Payer: Self-pay | Admitting: Physical Medicine & Rehabilitation

## 2020-09-02 VITALS — BP 143/83 | HR 87 | Temp 98.7°F | Ht 65.0 in | Wt 240.0 lb

## 2020-09-02 DIAGNOSIS — I639 Cerebral infarction, unspecified: Secondary | ICD-10-CM | POA: Diagnosis not present

## 2020-09-02 DIAGNOSIS — I6381 Other cerebral infarction due to occlusion or stenosis of small artery: Secondary | ICD-10-CM

## 2020-09-02 DIAGNOSIS — M792 Neuralgia and neuritis, unspecified: Secondary | ICD-10-CM | POA: Diagnosis not present

## 2020-09-02 DIAGNOSIS — M545 Low back pain, unspecified: Secondary | ICD-10-CM | POA: Insufficient documentation

## 2020-09-02 DIAGNOSIS — G8194 Hemiplegia, unspecified affecting left nondominant side: Secondary | ICD-10-CM | POA: Insufficient documentation

## 2020-09-02 DIAGNOSIS — G8929 Other chronic pain: Secondary | ICD-10-CM | POA: Diagnosis not present

## 2020-09-02 MED ORDER — METHOCARBAMOL 500 MG PO TABS: 500.0000 mg | ORAL_TABLET | Freq: Three times a day (TID) | ORAL | 1 refills | Status: AC | PRN

## 2020-09-02 NOTE — Patient Instructions (Signed)
PLEASE FEEL FREE TO CALL OUR OFFICE WITH ANY PROBLEMS OR QUESTIONS ([phone])      

## 2020-09-02 NOTE — Progress Notes (Signed)
Subjective:    Patient ID: [name], female    DOB: [birth year], 45 y.o.   MRN: [id number]  HPI   Nivia is here in follow up of her right thalamic infarction.  She reports that her back pain comes and goes.  She has done better with the Robaxin.  Neurology apparently increased her Topamax to help with her weight loss as it was described to her that this would help her back pain.  She completed therapy back in March and [facility] and has not resumed since.  She uses her walker for balance.  Still has some dysesthesias on the left side with a recent increase in her gabapentin help with these.  She states that she remains on Lexapro and Xanax for depression and anxiety but we have not filled these since she left the hospital.  It sounds as if her primary is feeling these medications.  We reviewed her lumbar x-rays from after her last visit and these show mild lumbar spondylosis and degenerative changes but nothing major.  Pain Inventory Average Pain 8 Pain Right Now 8 My pain is tingling and aching  LOCATION OF PAIN  Shoulder, hand, back, leg  BOWEL Number of stools per week: 2 Oral laxative use No  Type of laxative na Enema or suppository use No  History of colostomy No  Incontinent No   BLADDER Normal In and out cath, frequency na Able to self cath No  Bladder incontinence No  Frequent urination No  Leakage with coughing No  Difficulty starting stream No  Incomplete bladder emptying No    Mobility use a walker do you drive?  no  Function not employed: date last employed . I need assistance with the following:  bathing, meal prep and household duties  Neuro/Psych weakness numbness tingling trouble walking dizziness confusion depression anxiety  Prior Studies Any changes since last visit?  no  Physicians involved in your care Any changes since last visit?  no   Family History  Problem Relation Age of Onset  . Diabetes Mother   . Hypertension  Mother   . Kidney disease Mother   . Asthma Mother   . Hyperlipidemia Mother   . Alzheimer's disease Father   . Diabetes Father   . Kidney disease Sister   . Asthma Son   . Diabetes Sister   . Asthma Son   . ADD / ADHD Son   . Heart murmur Son   . Asthma Son   . Colon cancer Neg Hx   . Colon polyps Neg Hx    Social History   Socioeconomic History  . Marital status: Significant Other    Spouse name: [name]  . Number of children: 8  . Years of education: 81  . Highest education level: Not on file  Occupational History  . Occupation: unemployed    Comment: disabled  Tobacco Use  . Smoking status: Never Smoker  . Smokeless tobacco: Never Used  Vaping Use  . Vaping Use: Never used  Substance and Sexual Activity  . Alcohol use: Not Currently    Comment: occasionally  . Drug use: No  . Sexual activity: Yes    Birth control/protection: I.U.D.    Comment: followed by GYN  Other Topics Concern  . Not on file  Social History Narrative   Lives with fiance and kids   Right Handed   Drinks >12 cans of soda in caffeine   Social Determinants of Health   Financial Resource Strain:  Not on file  Food Insecurity: Not on file  Transportation Needs: Not on file  Physical Activity: Not on file  Stress: Not on file  Social Connections: Not on file   Past Surgical History:  Procedure Laterality Date  . CHOLECYSTECTOMY     Past Medical History:  Diagnosis Date  . Acute CVA (cerebrovascular accident) ([place]) 03/04/2020  . Acute CVA (cerebrovascular accident) ([place]) 03/04/2020  . Acute left-sided weakness 03/05/2020  . AMA (advanced maternal age) multigravida 35+ 11/12/2014  . Anemia   . Depression   . [place] multiparity 11/26/2014  . Hypertension   . Migraine   . Pregnant 11/12/2014  . PTSD (post-traumatic stress disorder) 05/01/2017  . Round ligament pain 11/12/2014  . Short of breath on exertion 11/12/2014  . Trichomonas infection    BP (!) 143/83   Pulse 87   Temp 98.7 F  (37.1 C)   Ht 5\' 5"  (1.651 m)   Wt 240 lb (108.9 kg)   SpO2 97%   BMI 39.94 kg/m   Opioid Risk Score:   Fall Risk Score:  `1  Depression screen PHQ 2/9  Depression screen [facility] 2/9 06/19/2020 05/15/2020 05/06/2020 04/03/2020 12/01/2017 02/15/2017 12/07/2016  Decreased Interest 1 1 1 2  0 0 0  Down, Depressed, Hopeless 1 1 1 3 2  0 0  PHQ - 2 Score 2 2 2 5 2  0 0  Altered sleeping 1 1 - 3 2 - -  Tired, decreased energy 2 1 - 2 3 - -  Change in appetite 1 1 - 2 0 - -  Feeling bad or failure about yourself  1 1 - 3 0 - -  Trouble concentrating 1 1 - 3 2 - -  Moving slowly or fidgety/restless 1 0 - 1 0 - -  Suicidal thoughts 0 0 - 0 0 - -  PHQ-9 Score 9 7 - 19 9 - -  Difficult doing work/chores Somewhat difficult Somewhat difficult - - Somewhat difficult - -  Some recent data might be hidden    Review of Systems  Musculoskeletal: Positive for back pain.       Shoulder, hand, leg pain  Neurological: Positive for dizziness, weakness and numbness.  Psychiatric/Behavioral: Positive for dysphoric mood. The patient is nervous/anxious.   All other systems reviewed and are negative.      Objective:   Physical Exam  General: No acute distress HEENT: EOMI, oral membranes moist Cards: reg rate  Chest: normal effort Abdomen: Soft, NT, ND Skin: dry, intact Extremities: no edema Psych: pleasant and appropriate   Neuro Alert and oriented x 3. Normal insight and awareness. Intact Memory. Normal language and speech. Cranial nerve exam unremarkable Sensory 1/2 LUE and LLE. Motor 4/5 on left 5/5 on right. .  Musculoskeletal: LB tender to touch in upper lumbar paraspinals. Pain with flexion and extension. Favors left leg tremendously during gait and drops the left pelvis..               Assessment & Plan:  1.  Left-sided hemiparesis secondary to right thalamic infarction             -Secondary stroke prophylaxis with Plavix 75 mg daily and aspirin 325 mg daily             -resume outpt PT  for her back pain, posture             -RW for safety and posture  -heat, modalities discussed 2.  Pain management/history of migraines             -  continue topiramate 50mg  bid             -Gabapentin for neuropathic pain 300 mg 3 times daily                         -Increased to 300 mg twice daily and 600 mg nightly with improvement                         -working better now that she's taking scheduled.  3.  Mood:  Per primary, lexapro, xanax 4.  Spasticity: controlled Baclofen 5 mg 3 times daily----DC  5. GERD- protonix                          -stop nsaids other than ECASA                         -GI f/u arranged.  6. Low back pain- likely d/t gait mechanics, mild spondylosis, spasm             -xrays reviewed             -robaxin trial             -heat             -resume therapy as above   Fifteen minutes of face to face patient care time were spent during this visit. All questions were encouraged and answered.  Follow up with me in 3 mos .

## 2020-09-04 ENCOUNTER — Ambulatory Visit (HOSPITAL_COMMUNITY): Payer: Medicaid Other

## 2020-09-04 ENCOUNTER — Encounter (HOSPITAL_COMMUNITY): Payer: Medicaid Other

## 2020-09-04 DIAGNOSIS — R197 Diarrhea, unspecified: Secondary | ICD-10-CM | POA: Diagnosis not present

## 2020-09-08 ENCOUNTER — Encounter (HOSPITAL_COMMUNITY): Payer: Medicaid Other

## 2020-09-08 ENCOUNTER — Ambulatory Visit (HOSPITAL_COMMUNITY): Payer: Medicaid Other

## 2020-09-10 ENCOUNTER — Ambulatory Visit: Payer: Medicaid Other | Admitting: Neurology

## 2020-09-10 ENCOUNTER — Encounter: Payer: Self-pay | Admitting: Neurology

## 2020-09-10 VITALS — BP 126/78 | HR 68 | Ht 65.0 in | Wt 235.3 lb

## 2020-09-10 DIAGNOSIS — E669 Obesity, unspecified: Secondary | ICD-10-CM

## 2020-09-10 DIAGNOSIS — R0683 Snoring: Secondary | ICD-10-CM

## 2020-09-10 DIAGNOSIS — R519 Headache, unspecified: Secondary | ICD-10-CM | POA: Diagnosis not present

## 2020-09-10 DIAGNOSIS — R351 Nocturia: Secondary | ICD-10-CM

## 2020-09-10 DIAGNOSIS — G4719 Other hypersomnia: Secondary | ICD-10-CM | POA: Diagnosis not present

## 2020-09-10 DIAGNOSIS — I6381 Other cerebral infarction due to occlusion or stenosis of small artery: Secondary | ICD-10-CM

## 2020-09-10 DIAGNOSIS — I639 Cerebral infarction, unspecified: Secondary | ICD-10-CM | POA: Diagnosis not present

## 2020-09-10 LAB — GASTROINTESTINAL PATHOGEN PANEL PCR
C. difficile Tox A/B, PCR: UNDETERMINED — AB
Campylobacter, PCR: UNDETERMINED — AB
Cryptosporidium, PCR: UNDETERMINED — AB
E coli (ETEC) LT/ST PCR: UNDETERMINED — AB
E coli (STEC) stx1/stx2, PCR: UNDETERMINED — AB
E coli 0157, PCR: UNDETERMINED — AB
Giardia lamblia, PCR: DETECTED — AB
Norovirus, PCR: UNDETERMINED — AB
Rotavirus A, PCR: UNDETERMINED — AB
Salmonella, PCR: UNDETERMINED — AB
Shigella, PCR: UNDETERMINED — AB

## 2020-09-10 LAB — C. DIFFICILE GDH AND TOXIN A/B
GDH ANTIGEN: NOT DETECTED
MICRO NUMBER:: 11752586
SPECIMEN QUALITY:: ADEQUATE
TOXIN A AND B: NOT DETECTED

## 2020-09-10 NOTE — Patient Instructions (Signed)

## 2020-09-10 NOTE — Progress Notes (Signed)
Subjective:    Patient ID: Joyce [name] is a 45 y.o. female.  HPI     Star Age, MD, PhD [facility] [address] P.O. [place], [place] [address]  Dear Joyce [name],   I saw your patient, Joyce [name], upon your kind request, in my Sleep clinic today for initial consultation of her sleep disorder, in particular, concern for underlying obstructive sleep apnea.  The patient is unaccompanied today.  As you know, [name] is a 45 year old right-handed woman with an underlying medical history of hypertension, right thalamic stroke in October 2021, anemia, depression, and obesity, who reports snoring and excessive daytime somnolence.  I reviewed your office note from 08/06/2020.  Her Epworth sleepiness score is 21 out of 24, fatigue severity score is 53 out of 63.  She takes trazodone at night for sleep, 100 mg.  Of note, she is also on Topamax and Robaxin, gabapentin, and Xanax. This is a partial list of her medications, her full list is under her med tab. She lives with 7 of her 8 children.  She currently does not work.  She has a significant other, Joyce [name], and her family reports snoring.  She has sleep disruption, primarily from nocturia, she goes to the bathroom about 4-5 times per average night and has woken up with a headache.  She is a non-smoker and does not drink alcohol, no caffeine on a day-to-day basis.  She is trying to lose weight.  She has 4 dogs in the household, she has a TV in her bedroom and tends to watch it at night and falls asleep to it, it tends to stay on all night unless she wakes up in the middle of the night to turn it off.  She is not aware of any family history of sleep apnea.  Her Past Medical History Is Significant For: Past Medical History:  Diagnosis Date  . Acute CVA (cerebrovascular accident) ([place]) 03/04/2020  . Acute CVA (cerebrovascular accident) ([place]) 03/04/2020  . Acute left-sided weakness 03/05/2020  . AMA  (advanced maternal age) multigravida 35+ 11/12/2014  . Anemia   . Depression   . [place] multiparity 11/26/2014  . Hypertension   . Migraine   . Pregnant 11/12/2014  . PTSD (post-traumatic stress disorder) 05/01/2017  . Round ligament pain 11/12/2014  . Short of breath on exertion 11/12/2014  . Trichomonas infection     Her Past Surgical History Is Significant For: Past Surgical History:  Procedure Laterality Date  . CHOLECYSTECTOMY      Her Family History Is Significant For: Family History  Problem Relation Age of Onset  . Diabetes Mother   . Hypertension Mother   . Kidney disease Mother   . Asthma Mother   . Hyperlipidemia Mother   . Alzheimer's disease Father   . Diabetes Father   . Kidney disease Sister   . Asthma Son   . Diabetes Sister   . Asthma Son   . ADD / ADHD Son   . Heart murmur Son   . Asthma Son   . Colon cancer Neg Hx   . Colon polyps Neg Hx     Her Social History Is Significant For: Social History   Socioeconomic History  . Marital status: Significant Other    Spouse name: Joyce  . Number of children: 8  . Years of education: 22  . Highest education level: Not on file  Occupational History  . Occupation: unemployed    Comment: disabled  Tobacco Use  . Smoking status: Never Smoker  . Smokeless tobacco: Never Used  Vaping Use  . Vaping Use: Never used  Substance and Sexual Activity  . Alcohol use: Not Currently    Comment: occasionally  . Drug use: No  . Sexual activity: Yes    Birth control/protection: I.U.D.    Comment: followed by GYN  Other Topics Concern  . Not on file  Social History Narrative   Lives with fiance and kids   Right Handed   Drinks >12 cans of soda in caffeine   Social Determinants of Health   Financial Resource Strain: Not on file  Food Insecurity: Not on file  Transportation Needs: Not on file  Physical Activity: Not on file  Stress: Not on file  Social Connections: Not on file    Her Allergies Are:  No  Known Allergies:   Her Current Medications Are:  Outpatient Encounter Medications as of 09/10/2020  Medication Sig  . ALPRAZolam (XANAX) 0.25 MG tablet TAKE 1 TABLET (0.25 MG TOTAL) BY MOUTH DAILY AS NEEDED FOR ANXIETY.  [name] amLODipine (NORVASC) 5 MG tablet TAKE 1 TABLET (5 MG TOTAL) BY MOUTH DAILY.  [name] aspirin 325 MG EC tablet Take 1 tablet (325 mg total) by mouth daily.  [name] aspirin EC 325 MG tablet TAKE 1 TABLET (325 MG TOTAL) BY MOUTH DAILY.  [name] atorvastatin (LIPITOR) 80 MG tablet TAKE 1 TABLET (80 MG TOTAL) BY MOUTH DAILY.  [name] dicyclomine (BENTYL) 10 MG capsule Take 1 capsule (10 mg total) by mouth 4 (four) times daily -  before meals and at bedtime. Stop if constipated  . escitalopram (LEXAPRO) 20 MG tablet TAKE 1 TABLET (20 MG TOTAL) BY MOUTH DAILY.  [name] gabapentin (NEURONTIN) 300 MG capsule TAKE 1 CAPSULE BY MOUTH TWICE DAILY AND 2 CAPSULES EVERY NIGHT AT BEDTIME  . ibuprofen (ADVIL) 600 MG tablet Take 1 tablet (600 mg total) by mouth every 6 (six) hours as needed.  . methocarbamol (ROBAXIN) 500 MG tablet Take 1 tablet (500 mg total) by mouth every 8 (eight) hours as needed for muscle spasms.  . pantoprazole (PROTONIX) 40 MG tablet Take 1 tablet (40 mg total) by mouth daily.  [name] PARAGARD INTRAUTERINE COPPER IU by Intrauterine route.  . topiramate (TOPAMAX) 50 MG tablet Take 1 tablet (50 mg total) by mouth 2 (two) times daily. Start 1 tablet daily x 1 week and then twice daily  . traZODone (DESYREL) 100 MG tablet Take 100 mg by mouth at bedtime.   No facility-administered encounter medications on file as of 09/10/2020.  :   Review of Systems:  Out of a complete 14 point review of systems, all are reviewed and negative with the exception of these symptoms as listed below:  Review of Systems  Neurological:       Here for sleep consult. No prior sleep study. Hx of stroke and snoring is present. Epworth Sleepiness Scale 0= would never doze 1= slight chance of dozing 2= moderate chance of  dozing 3= high chance of dozing  Sitting and reading:3 Watching TV:3 Sitting inactive in a public place (ex. Theater or meeting):3 As a passenger in a car for an hour without a break:3 Lying down to rest in the afternoon:3 Sitting and talking to someone:2 Sitting quietly after lunch (no alcohol):2 In a car, while stopped in traffic:2 Total:21     Objective:  Neurological Exam  Physical Exam Physical Examination:   Vitals:   09/10/20 0846  BP: 126/78  Pulse:  68  SpO2: 98%    General Examination: The patient is a very pleasant 45 y.o. female in no acute distress. She appears well-developed and well-nourished and well groomed.   HEENT: Normocephalic, atraumatic, pupils are equal, round and reactive to light, extraocular tracking is good without limitation to gaze excursion or nystagmus noted. Hearing is grossly intact. Face is symmetric with normal facial animation. Speech is clear with no dysarthria noted. There is no hypophonia. There is no lip, neck/head, jaw or voice tremor. Neck is supple with full range of passive and active motion. There are no carotid bruits on auscultation. Oropharynx exam reveals: moderate mouth dryness, adequate dental hygiene and moderate airway crowding, due to small airway entry, tonsils and uvula not fully visualized, Mallampati class IV.  She has a mild overbite.  Tongue protrudes centrally in palate elevates symmetrically.   Chest: Clear to auscultation without wheezing, rhonchi or crackles noted.  Heart: S1+S2+0, regular and normal without murmurs, rubs or gallops noted.   Abdomen: Soft, non-tender and non-distended with normal bowel sounds appreciated on auscultation.  Extremities: There is no pitting edema in the distal lower extremities bilaterally.   Skin: Warm and dry without trophic changes noted.   Musculoskeletal: exam reveals no obvious joint deformities, tenderness or joint swelling or erythema.   Neurologically:  Mental status:  The patient is awake, alert and oriented in all 4 spheres. Her immediate and remote memory, attention, language skills and fund of knowledge are appropriate. There is no evidence of aphasia, agnosia, apraxia or anomia. Speech is clear with normal prosody and enunciation. Thought process is linear. Mood is normal and affect is normal.  Cranial nerves II - XII are as described above under HEENT exam.  Motor exam: Normal bulk, mild increase in tone in the left upper and lower extremity, mild weakness in the left upper and lower extremity, in the 4 out of 5 range.  Romberg is not tested for safety concerns, fine motor skills are normal on the right.   Sensory exam: intact to light touch in the upper and lower extremities.  Gait, station and balance: She stands with difficulty and has to push herself up.  She requires no assistance.  She walks with a rolling walker.  She has a limp on the left.  Assessment and Plan:   In summary, AHNYLA MENDEL is a very pleasant 45 y.o.-year old female with an underlying medical history of hypertension, right thalamic stroke in October 2021, anemia, depression, and obesity, whose history and physical exam are concerning for obstructive sleep apnea (OSA). I had a long chat with the patient about my findings and the diagnosis of OSA, its prognosis and treatment options. We talked about medical treatments, surgical interventions and non-pharmacological approaches. I explained in particular the risks and ramifications of untreated moderate to severe OSA, especially with respect to developing cardiovascular disease down the Road, including congestive heart failure, difficult to treat hypertension, cardiac arrhythmias, or stroke. Even type 2 diabetes has, in part, been linked to untreated OSA. Symptoms of untreated OSA include daytime sleepiness, memory problems, mood irritability and mood disorder such as depression and anxiety, lack of energy, as well as recurrent headaches,  especially morning headaches. We talked about trying to maintain a healthy lifestyle in general, as well as the importance of weight control. We also talked about the importance of good sleep hygiene. I recommended the following at this time: sleep study.  I explained the sleep test procedure to the patient and also  outlined possible surgical and non-surgical treatment options of OSA, including the use of a custom-made dental device (which would require a referral to a specialist dentist or oral surgeon), upper airway surgical options, such as traditional UPPP or a novel less invasive surgical option in the form of Inspire hypoglossal nerve stimulation (which would involve a referral to an ENT surgeon). I also explained the CPAP treatment option to the patient, who indicated that she would be willing to try CPAP if the need arises. I explained the importance of being compliant with PAP treatment, not only for insurance purposes but primarily to improve Her symptoms, and for the patient's long term health benefit, including to reduce Her cardiovascular risks. I answered all her questions today and the patient was in agreement. I plan to see her back after the sleep study is completed and encouraged her to call with any interim questions, concerns, problems or updates.   Thank you very much for allowing me to participate in the care of this nice patient. If I can be of any further assistance to you please do not hesitate to talk to me.  Sincerely,   Star Age, MD, PhD

## 2020-09-11 ENCOUNTER — Encounter (HOSPITAL_COMMUNITY): Payer: Medicaid Other

## 2020-09-11 ENCOUNTER — Other Ambulatory Visit: Payer: Self-pay | Admitting: Gastroenterology

## 2020-09-11 ENCOUNTER — Ambulatory Visit (HOSPITAL_COMMUNITY): Payer: Medicaid Other

## 2020-09-11 MED ORDER — METRONIDAZOLE 500 MG PO TABS: 500.0000 mg | ORAL_TABLET | Freq: Two times a day (BID) | ORAL | 0 refills | Status: AC

## 2020-09-15 ENCOUNTER — Encounter (HOSPITAL_COMMUNITY): Payer: Medicaid Other

## 2020-09-15 ENCOUNTER — Ambulatory Visit (HOSPITAL_COMMUNITY): Payer: Medicaid Other

## 2020-09-15 DIAGNOSIS — F331 Major depressive disorder, recurrent, moderate: Secondary | ICD-10-CM | POA: Diagnosis not present

## 2020-09-15 DIAGNOSIS — F411 Generalized anxiety disorder: Secondary | ICD-10-CM | POA: Diagnosis not present

## 2020-09-15 DIAGNOSIS — Z79899 Other long term (current) drug therapy: Secondary | ICD-10-CM | POA: Diagnosis not present

## 2020-09-15 DIAGNOSIS — F410 Panic disorder [episodic paroxysmal anxiety] without agoraphobia: Secondary | ICD-10-CM | POA: Diagnosis not present

## 2020-09-15 DIAGNOSIS — Z5181 Encounter for therapeutic drug level monitoring: Secondary | ICD-10-CM | POA: Diagnosis not present

## 2020-09-18 ENCOUNTER — Ambulatory Visit (HOSPITAL_COMMUNITY): Payer: Medicaid Other

## 2020-09-18 ENCOUNTER — Encounter (HOSPITAL_COMMUNITY): Payer: Medicaid Other

## 2020-09-22 ENCOUNTER — Telehealth: Payer: Self-pay

## 2020-09-22 NOTE — Telephone Encounter (Signed)
Calling to schedule sleep study. 

## 2020-09-24 ENCOUNTER — Telehealth: Payer: Self-pay

## 2020-09-24 NOTE — Telephone Encounter (Signed)
LVM for pt to call me back to schedule sleep study  

## 2020-09-28 ENCOUNTER — Ambulatory Visit (HOSPITAL_COMMUNITY): Payer: Medicaid Other

## 2020-10-06 ENCOUNTER — Ambulatory Visit: Payer: Medicaid Other | Admitting: Nurse Practitioner

## 2020-10-06 ENCOUNTER — Encounter: Payer: Self-pay | Admitting: Nurse Practitioner

## 2020-10-06 ENCOUNTER — Other Ambulatory Visit: Payer: Self-pay

## 2020-10-06 DIAGNOSIS — H9203 Otalgia, bilateral: Secondary | ICD-10-CM | POA: Insufficient documentation

## 2020-10-06 MED ORDER — FLUTICASONE PROPIONATE 50 MCG/ACT NA SUSP: NASAL | 6 refills | Status: AC

## 2020-10-06 MED ORDER — PREDNISONE 20 MG PO TABS: 40.0000 mg | ORAL_TABLET | Freq: Every day | ORAL | 0 refills | Status: AC

## 2020-10-06 NOTE — Progress Notes (Signed)
Acute Office Visit  Subjective:    Patient ID: Joyce [name], female    DOB: Nov 07, 1975, 45 y.o.   MRN: [id number]  Chief Complaint  Patient presents with  . Otalgia    R and L ear pain; fullness x 2 weeks.     HPI Patient is in today for bilateral ear pain and fullness x 2 weeks. She states that she can barely hear.  She has been taking a variety of over the counter ear drops. She also states that she has been using flonase without relief.  Past Medical History:  Diagnosis Date  . Acute CVA (cerebrovascular accident) ([place]) 03/04/2020  . Acute CVA (cerebrovascular accident) ([place]) 03/04/2020  . Acute left-sided weakness 03/05/2020  . AMA (advanced maternal age) multigravida 35+ 11/12/2014  . Anemia   . Depression   . [place] multiparity 11/26/2014  . Hypertension   . Migraine   . Pregnant 11/12/2014  . PTSD (post-traumatic stress disorder) 05/01/2017  . Round ligament pain 11/12/2014  . Short of breath on exertion 11/12/2014  . Trichomonas infection     Past Surgical History:  Procedure Laterality Date  . CHOLECYSTECTOMY      Family History  Problem Relation Age of Onset  . Diabetes Mother   . Hypertension Mother   . Kidney disease Mother   . Asthma Mother   . Hyperlipidemia Mother   . Alzheimer's disease Father   . Diabetes Father   . Kidney disease Sister   . Asthma Son   . Diabetes Sister   . Asthma Son   . ADD / ADHD Son   . Heart murmur Son   . Asthma Son   . Colon cancer Neg Hx   . Colon polyps Neg Hx     Social History   Socioeconomic History  . Marital status: Significant Other    Spouse name: [name]  . Number of children: 8  . Years of education: 9  . Highest education level: Not on file  Occupational History  . Occupation: unemployed    Comment: disabled  Tobacco Use  . Smoking status: Never Smoker  . Smokeless tobacco: Never Used  Vaping Use  . Vaping Use: Never used  Substance and Sexual Activity  . Alcohol use: Not Currently     Comment: occasionally  . Drug use: No  . Sexual activity: Yes    Birth control/protection: I.U.D.    Comment: followed by GYN  Other Topics Concern  . Not on file  Social History Narrative   Lives with fiance and kids   Right Handed   Drinks >12 cans of soda in caffeine   Social Determinants of Health   Financial Resource Strain: Not on file  Food Insecurity: Not on file  Transportation Needs: Not on file  Physical Activity: Not on file  Stress: Not on file  Social Connections: Not on file  Intimate Partner Violence: Not on file    Outpatient Medications Prior to Visit  Medication Sig Dispense Refill  . amLODipine (NORVASC) 5 MG tablet TAKE 1 TABLET (5 MG TOTAL) BY MOUTH DAILY. 30 tablet 0  . aspirin 325 MG EC tablet Take 1 tablet (325 mg total) by mouth daily. 100 tablet 0  . aspirin EC 325 MG tablet TAKE 1 TABLET (325 MG TOTAL) BY MOUTH DAILY. 100 tablet 0  . atorvastatin (LIPITOR) 80 MG tablet TAKE 1 TABLET (80 MG TOTAL) BY MOUTH DAILY. 30 tablet 0  . dicyclomine (BENTYL) 10 MG capsule Take 1  capsule (10 mg total) by mouth 4 (four) times daily -  before meals and at bedtime. Stop if constipated 120 capsule 3  . escitalopram (LEXAPRO) 20 MG tablet TAKE 1 TABLET (20 MG TOTAL) BY MOUTH DAILY. 20 tablet 0  . gabapentin (NEURONTIN) 300 MG capsule TAKE 1 CAPSULE BY MOUTH TWICE DAILY AND 2 CAPSULES EVERY NIGHT AT BEDTIME 120 capsule 1  . ibuprofen (ADVIL) 600 MG tablet Take 1 tablet (600 mg total) by mouth every 6 (six) hours as needed. 30 tablet 0  . methocarbamol (ROBAXIN) 500 MG tablet Take 1 tablet (500 mg total) by mouth every 8 (eight) hours as needed for muscle spasms. 90 tablet 1  . pantoprazole (PROTONIX) 40 MG tablet Take 1 tablet (40 mg total) by mouth daily. 30 tablet 1  . PARAGARD INTRAUTERINE COPPER IU by Intrauterine route.    . topiramate (TOPAMAX) 50 MG tablet Take 1 tablet (50 mg total) by mouth 2 (two) times daily. Start 1 tablet daily x 1 week and then twice daily  60 tablet 3  . traZODone (DESYREL) 100 MG tablet Take 100 mg by mouth at bedtime.     No facility-administered medications prior to visit.    No Known Allergies  Review of Systems  HENT: Positive for ear pain and hearing loss. Negative for congestion, sinus pressure and sinus pain.   Cardiovascular: Negative.        Objective:    Physical Exam Constitutional:      Appearance: Normal appearance. She is obese.  HENT:     Right Ear: Ear canal and external ear normal.     Left Ear: Ear canal and external ear normal.     Ears:     Comments: Serous fluid behind both TMs    Nose: Nose normal.     Mouth/Throat:     Mouth: Mucous membranes are moist.     Pharynx: Oropharynx is clear.  Neurological:     Mental Status: She is alert.     BP 127/78   Pulse 90   Temp 98.6 F (37 C)   Resp 18   Ht 5\' 5"  (1.651 m)   Wt 233 lb (105.7 kg)   SpO2 96%   BMI 38.77 kg/m  Wt Readings from Last 3 Encounters:  10/06/20 233 lb (105.7 kg)  09/10/20 235 lb 5 oz (106.7 kg)  09/02/20 240 lb (108.9 kg)    Health Maintenance Due  Topic Date Due  . COVID-19 Vaccine (1) Never done  . PAP SMEAR-Modifier  12/01/2020    There are no preventive care reminders to display for this patient.   Lab Results  Component Value Date   TSH 0.523 03/06/2020   Lab Results  Component Value Date   WBC 10.0 06/18/2020   HGB 15.3 06/18/2020   HCT 46.5 06/18/2020   MCV 80 06/18/2020   PLT 275 06/18/2020   Lab Results  Component Value Date   NA 142 06/18/2020   K 3.8 06/18/2020   CO2 22 06/18/2020   GLUCOSE 78 06/18/2020   BUN 14 06/18/2020   CREATININE 0.78 06/18/2020   BILITOT 0.8 06/18/2020   ALKPHOS 69 06/18/2020   AST 21 06/18/2020   ALT 23 06/18/2020   PROT 7.6 06/18/2020   ALBUMIN 4.2 06/18/2020   CALCIUM 9.6 06/18/2020   ANIONGAP 9 05/20/2020   Lab Results  Component Value Date   CHOL 114 08/06/2020   Lab Results  Component Value Date   HDL 43 08/06/2020  Lab Results   Component Value Date   LDLCALC 54 08/06/2020   Lab Results  Component Value Date   TRIG 86 08/06/2020   Lab Results  Component Value Date   CHOLHDL 2.7 08/06/2020   Lab Results  Component Value Date   HGBA1C 5.6 03/05/2020       Assessment & Plan:   Problem List Items Addressed This Visit      Other   Acute otalgia, bilateral    -Rx. flonase and prednisone -has R>L hearing loss x 2 weeks -has serous effusion behind both TMs; scar tissue to left TM -she states she has been using flonase without effect, so we reviewed dosage instructions -no s/s of infection -referral to ENT, and she will cancel if pain resolves      Relevant Medications   fluticasone (FLONASE) 50 MCG/ACT nasal spray   predniSONE (DELTASONE) 20 MG tablet   Other Relevant Orders   Ambulatory referral to ENT       Meds ordered this encounter  Medications  . fluticasone (FLONASE) 50 MCG/ACT nasal spray    Sig: Use 2 sprays in each nostril BID for a week. After 1 week, decrease to 1 spray in each nostril BID as needed for congestion/allergies.    Dispense:  16 g    Refill:  6  . predniSONE (DELTASONE) 20 MG tablet    Sig: Take 2 tablets (40 mg total) by mouth daily with breakfast for 5 days.    Dispense:  10 tablet    Refill:  0     Noreene Larsson, NP

## 2020-10-06 NOTE — Assessment & Plan Note (Signed)
-  Rx. flonase and prednisone -has R>L hearing loss x 2 weeks -has serous effusion behind both TMs; scar tissue to left TM -she states she has been using flonase without effect, so we reviewed dosage instructions -no s/s of infection -referral to ENT, and she will cancel if pain resolves

## 2020-10-07 ENCOUNTER — Ambulatory Visit: Payer: Medicaid Other | Admitting: Gastroenterology

## 2020-10-09 ENCOUNTER — Ambulatory Visit (INDEPENDENT_AMBULATORY_CARE_PROVIDER_SITE_OTHER): Payer: Medicaid Other | Admitting: Neurology

## 2020-10-09 ENCOUNTER — Other Ambulatory Visit: Payer: Self-pay

## 2020-10-09 DIAGNOSIS — E669 Obesity, unspecified: Secondary | ICD-10-CM

## 2020-10-09 DIAGNOSIS — R0683 Snoring: Secondary | ICD-10-CM

## 2020-10-09 DIAGNOSIS — I639 Cerebral infarction, unspecified: Secondary | ICD-10-CM

## 2020-10-09 DIAGNOSIS — G4720 Circadian rhythm sleep disorder, unspecified type: Secondary | ICD-10-CM

## 2020-10-09 DIAGNOSIS — G4719 Other hypersomnia: Secondary | ICD-10-CM

## 2020-10-09 DIAGNOSIS — R519 Headache, unspecified: Secondary | ICD-10-CM

## 2020-10-09 DIAGNOSIS — I6381 Other cerebral infarction due to occlusion or stenosis of small artery: Secondary | ICD-10-CM

## 2020-10-09 DIAGNOSIS — R351 Nocturia: Secondary | ICD-10-CM

## 2020-10-19 NOTE — Procedures (Signed)
PATIENT'S NAME:  Joyce [name], Joyce [name] DOB:      [birth year]      MR#:    [id number]     DATE OF RECORDING: 10/09/2020 REFERRING M.D.:  Antony Contras, MD Study Performed:   Baseline Polysomnogram HISTORY: 45 year old woman with a history of hypertension, right thalamic stroke in October 2021, anemia, depression, and obesity, who reports snoring and excessive daytime somnolence. The patient endorsed the Epworth Sleepiness Scale at 21 points. The patient's weight 235 pounds with a height of 65 (inches), resulting in a BMI of 39.3 kg/m2. The patient's neck circumference measured  inches.  CURRENT MEDICATIONS: Xanax, Norvasc, Aspirin, Lipitor, Bentyl, Lexapro, Neurontin, Advil, Robaxin, Protonix, Topomax, Desyrel, Paragard IU   PROCEDURE:  This is a multichannel digital polysomnogram utilizing the Somnostar 11.2 system.  Electrodes and sensors were applied and monitored per AASM Specifications.   EEG, EOG, Chin and Limb EMG, were sampled at 200 Hz.  ECG, Snore and Nasal Pressure, Thermal Airflow, Respiratory Effort, CPAP Flow and Pressure, Oximetry was sampled at 50 Hz. Digital video and audio were recorded.      BASELINE STUDY  Lights Out was at 21:51 and Lights On at 05:24.  Total recording time (TRT) was 454 minutes, with a total sleep time (TST) of 445.5 minutes.   The patient's sleep latency was 6 minutes.  REM latency was 144 minutes, which is delayed. The sleep efficiency was 98.1 %.     SLEEP ARCHITECTURE: WASO (Wake after sleep onset) was 2.5 minutes without any sleep fragmentation noted. There were 4.5 minutes in Stage N1, 312.5 minutes Stage N2, 78.5 minutes Stage N3 and 50 minutes in Stage REM.  The percentage of Stage N1 was 1.%, Stage N2 was 70.1%, which is markedly increased, Stage N3 was 17.6%, which is normal, and Stage R (REM sleep) was 11.2%, which is reduced. The arousals were noted as: 10 were spontaneous, 0 were associated with PLMs, 0 were associated with respiratory events.  RESPIRATORY  ANALYSIS:  There were a total of 1 respiratory events:  0 obstructive apneas, 0 central apneas and 0 mixed apneas with a total of 0 apneas and an apnea index (AI) of 0 /hour. There were 1 hypopneas with a hypopnea index of .1 /hour. The patient also had 0 respiratory event related arousals (RERAs).      The total APNEA/HYPOPNEA INDEX (AHI) was .1/hour and the total RESPIRATORY DISTURBANCE INDEX was  .1 /hour.  1 events occurred in REM sleep and 0 events in NREM. The REM AHI was  1.2 /hour, versus a non-REM AHI of 0. The patient spent 445.5 minutes of total sleep time in the supine position and 0 minutes in non-supine.. The supine AHI was 0.1 versus a non-supine AHI of 0.0.  OXYGEN SATURATION & C02:  The Wake baseline 02 saturation was 96%, with the lowest being 90%. Time spent below 89% saturation equaled 0 minutes.  PERIODIC LIMB MOVEMENTS: The patient had a total of 0 Periodic Limb Movements.  The Periodic Limb Movement (PLM) index was 0 and the PLM Arousal index was 0/hour.  Audio and video analysis did not show any abnormal or unusual movements, behaviors, phonations or vocalizations. The patient took no bathroom breaks. Mild, intermittent snoring was noted. The EKG was in keeping with normal sinus rhythm (NSR).  Post-study, the patient indicated that sleep was the same as usual.   IMPRESSION:  1. Primary Snoring 2. Dysfunctions associated with sleep stages or arousal from sleep  RECOMMENDATIONS:  1. This study does  not demonstrate any significant obstructive or central sleep disordered breathing with the exception of mild, intermittent snoring; weight loss may aid in reducing her snoring. Treatment with positive airway pressure is not recommended or necessary. This study does not support an intrinsic sleep disorder as a cause of the patient's symptoms. Other causes, including circadian rhythm disturbances, an underlying mood disorder, medication effect and/or an underlying medical problem  cannot be ruled out. 2. This study shows no significant sleep fragmentation and mildly abnormal sleep stage percentages; these are nonspecific findings and per se do not signify an intrinsic sleep disorder or a cause for the patient's sleep-related symptoms. Causes include (but are not limited to) the first night effect of the sleep study, circadian rhythm disturbances, medication effect or an underlying mood disorder or medical problem.  3. The patient should be cautioned not to drive, work at heights, or operate dangerous or heavy equipment when tired or sleepy. Review and reiteration of good sleep hygiene measures should be pursued with any patient. 4. The patient will be advised to follow up with the referring provider, who will be notified of the test results.  I certify that I have reviewed the entire raw data recording prior to the issuance of this report in accordance with the Standards of Accreditation of the American Academy of Sleep Medicine (AASM)   Star Age, MD, PhD Diplomat, American Board of Neurology and Sleep Medicine (Neurology and Sleep Medicine)

## 2020-10-19 NOTE — Progress Notes (Signed)
Patient referred by Dr. Leonie Man, seen by me on 09/10/20, diagnostic PSG on 10/09/20.   Please call and notify the patient that the recent sleep study did not show any significant obstructive sleep apnea with the exception of mild, intermittent snoring; weight loss may aid in reducing her snoring. Treatment with positive airway pressure is not recommended or necessary. At this juncture, she can FU with Dr. Leonie Man and her PCP as scheduled. Please remind patient to try to maintain good sleep hygiene, which means: Keep a regular sleep and wake schedule and make enough time for sleep (7 1/2 to 8 1/2 hours for the average adult), try not to exercise or have a meal within 2 hours of your bedtime, try to keep your bedroom conducive for sleep, that is, cool and dark, without light distractors such as an illuminated alarm clock, and refrain from watching TV right before sleep or in the middle of the night and do not keep the TV or radio on during the night. If a nightlight is used, have it away from the visual field. Also, try not to use or play on electronic devices at bedtime, such as your cell phone, tablet PC or laptop. If you like to read at bedtime on an electronic device, try to dim the background light as much as possible. Do not eat in the middle of the night. Keep pets away from the bedroom environment. For stress relief, try meditation, deep breathing exercises (there are many books and CDs available), a white noise machine or fan can help to diffuse other noise distractors, such as traffic noise. Do not drink alcohol before bedtime, as it can disturb sleep and cause middle of the night awakenings. Never mix alcohol and sedating medications! Avoid narcotic pain medication close to bedtime, as opioids/narcotics can suppress breathing drive and breathing effort.    Star Age, MD, PhD [facility])

## 2020-10-20 ENCOUNTER — Telehealth: Payer: Self-pay

## 2020-10-20 DIAGNOSIS — F411 Generalized anxiety disorder: Secondary | ICD-10-CM | POA: Diagnosis not present

## 2020-10-20 DIAGNOSIS — F331 Major depressive disorder, recurrent, moderate: Secondary | ICD-10-CM | POA: Diagnosis not present

## 2020-10-20 DIAGNOSIS — F410 Panic disorder [episodic paroxysmal anxiety] without agoraphobia: Secondary | ICD-10-CM | POA: Diagnosis not present

## 2020-10-20 NOTE — Telephone Encounter (Signed)
I called pt. No answer, left a message asking pt to call me back.   

## 2020-10-20 NOTE — Telephone Encounter (Signed)
-----   Message from Star Age, MD sent at 10/19/2020  5:20 PM EDT ----- Patient referred by Dr. Leonie Man, seen by me on 09/10/20, diagnostic PSG on 10/09/20.   Please call and notify the patient that the recent sleep study did not show any significant obstructive sleep apnea with the exception of mild, intermittent snoring; weight loss may aid in reducing her snoring. Treatment with positive airway pressure is not recommended or necessary. At this juncture, she can FU with Dr. Leonie Man and her PCP as scheduled. Please remind patient to try to maintain good sleep hygiene, which means: Keep a regular sleep and wake schedule and make enough time for sleep (7 1/2 to 8 1/2 hours for the average adult), try not to exercise or have a meal within 2 hours of your bedtime, try to keep your bedroom conducive for sleep, that is, cool and dark, without light distractors such as an illuminated alarm clock, and refrain from watching TV right before sleep or in the middle of the night and do not keep the TV or radio on during the night. If a nightlight is used, have it away from the visual field. Also, try not to use or play on electronic devices at bedtime, such as your cell phone, tablet PC or laptop. If you like to read at bedtime on an electronic device, try to dim the background light as much as possible. Do not eat in the middle of the night. Keep pets away from the bedroom environment. For stress relief, try meditation, deep breathing exercises (there are many books and CDs available), a white noise machine or fan can help to diffuse other noise distractors, such as traffic noise. Do not drink alcohol before bedtime, as it can disturb sleep and cause middle of the night awakenings. Never mix alcohol and sedating medications! Avoid narcotic pain medication close to bedtime, as opioids/narcotics can suppress breathing drive and breathing effort.    Star Age, MD, PhD [facility])

## 2020-10-21 ENCOUNTER — Other Ambulatory Visit: Payer: Self-pay

## 2020-10-21 ENCOUNTER — Ambulatory Visit: Payer: Medicaid Other | Admitting: Nurse Practitioner

## 2020-10-21 ENCOUNTER — Encounter: Payer: Self-pay | Admitting: Nurse Practitioner

## 2020-10-21 VITALS — BP 131/79 | HR 68 | Temp 98.4°F | Resp 20 | Ht 65.0 in | Wt 234.0 lb

## 2020-10-21 DIAGNOSIS — E785 Hyperlipidemia, unspecified: Secondary | ICD-10-CM | POA: Diagnosis not present

## 2020-10-21 DIAGNOSIS — I10 Essential (primary) hypertension: Secondary | ICD-10-CM | POA: Diagnosis not present

## 2020-10-21 DIAGNOSIS — H9203 Otalgia, bilateral: Secondary | ICD-10-CM

## 2020-10-21 NOTE — Assessment & Plan Note (Addendum)
-  unsure of etiology; I don't see fluid effusions that were present at last visit, but pain persists -R>L pain persisting despite prednisone and flonase -referral to ENT was initially denied d/t her insurance -will send in referral to different office; referral team notified

## 2020-10-21 NOTE — Progress Notes (Signed)
Established Patient Office Visit  Subjective:  Patient ID: Joyce [name], female    DOB: 1976-01-22  Age: 45 y.o. MRN: [id number]  CC:  Chief Complaint  Patient presents with  . Otalgia  . Depression    Med check     HPI KEARA [name] presents for med check. At her last OV, she had serous fluid behind both TMs and R>L hearing loss. She was prescribed flonase and prednisone. She got a ENT referral as well, but this referral was closed. Unsure why.   Past Medical History:  Diagnosis Date  . Acute CVA (cerebrovascular accident) ([place]) 03/04/2020  . Acute CVA (cerebrovascular accident) ([place]) 03/04/2020  . Acute left-sided weakness 03/05/2020  . AMA (advanced maternal age) multigravida 35+ 11/12/2014  . Anemia   . Depression   . [place] multiparity 11/26/2014  . Hypertension   . Migraine   . Pregnant 11/12/2014  . PTSD (post-traumatic stress disorder) 05/01/2017  . Round ligament pain 11/12/2014  . Short of breath on exertion 11/12/2014  . Trichomonas infection     Past Surgical History:  Procedure Laterality Date  . CHOLECYSTECTOMY      Family History  Problem Relation Age of Onset  . Diabetes Mother   . Hypertension Mother   . Kidney disease Mother   . Asthma Mother   . Hyperlipidemia Mother   . Alzheimer's disease Father   . Diabetes Father   . Kidney disease Sister   . Asthma Son   . Diabetes Sister   . Asthma Son   . ADD / ADHD Son   . Heart murmur Son   . Asthma Son   . Colon cancer Neg Hx   . Colon polyps Neg Hx     Social History   Socioeconomic History  . Marital status: Significant Other    Spouse name: [name]  . Number of children: 8  . Years of education: 31  . Highest education level: Not on file  Occupational History  . Occupation: unemployed    Comment: disabled  Tobacco Use  . Smoking status: Never Smoker  . Smokeless tobacco: Never Used  Vaping Use  . Vaping Use: Never used  Substance and Sexual Activity  . Alcohol use: Not  Currently    Comment: occasionally  . Drug use: No  . Sexual activity: Yes    Birth control/protection: I.U.D.    Comment: followed by GYN  Other Topics Concern  . Not on file  Social History Narrative   Lives with fiance and kids   Right Handed   Drinks >12 cans of soda in caffeine   Social Determinants of Health   Financial Resource Strain: Not on file  Food Insecurity: Not on file  Transportation Needs: Not on file  Physical Activity: Not on file  Stress: Not on file  Social Connections: Not on file  Intimate Partner Violence: Not on file    Outpatient Medications Prior to Visit  Medication Sig Dispense Refill  . amLODipine (NORVASC) 5 MG tablet TAKE 1 TABLET (5 MG TOTAL) BY MOUTH DAILY. 30 tablet 0  . aspirin 325 MG EC tablet Take 1 tablet (325 mg total) by mouth daily. 100 tablet 0  . aspirin EC 325 MG tablet TAKE 1 TABLET (325 MG TOTAL) BY MOUTH DAILY. 100 tablet 0  . atorvastatin (LIPITOR) 80 MG tablet TAKE 1 TABLET (80 MG TOTAL) BY MOUTH DAILY. 30 tablet 0  . dicyclomine (BENTYL) 10 MG capsule Take 1 capsule (10 mg total)  by mouth 4 (four) times daily -  before meals and at bedtime. Stop if constipated 120 capsule 3  . escitalopram (LEXAPRO) 20 MG tablet TAKE 1 TABLET (20 MG TOTAL) BY MOUTH DAILY. 20 tablet 0  . fluticasone (FLONASE) 50 MCG/ACT nasal spray Use 2 sprays in each nostril BID for a week. After 1 week, decrease to 1 spray in each nostril BID as needed for congestion/allergies. 16 g 6  . gabapentin (NEURONTIN) 300 MG capsule TAKE 1 CAPSULE BY MOUTH TWICE DAILY AND 2 CAPSULES EVERY NIGHT AT BEDTIME 120 capsule 1  . ibuprofen (ADVIL) 600 MG tablet Take 1 tablet (600 mg total) by mouth every 6 (six) hours as needed. 30 tablet 0  . methocarbamol (ROBAXIN) 500 MG tablet Take 1 tablet (500 mg total) by mouth every 8 (eight) hours as needed for muscle spasms. 90 tablet 1  . pantoprazole (PROTONIX) 40 MG tablet Take 1 tablet (40 mg total) by mouth daily. 30 tablet 1  .  PARAGARD INTRAUTERINE COPPER IU by Intrauterine route.    . topiramate (TOPAMAX) 50 MG tablet Take 1 tablet (50 mg total) by mouth 2 (two) times daily. Start 1 tablet daily x 1 week and then twice daily 60 tablet 3  . traZODone (DESYREL) 100 MG tablet Take 100 mg by mouth at bedtime.     No facility-administered medications prior to visit.    No Known Allergies  ROS Review of Systems  Constitutional: Negative.   HENT: Positive for ear pain and hearing loss. Negative for ear discharge, sinus pressure, sinus pain and tinnitus.   Respiratory: Negative.   Cardiovascular: Negative.   Psychiatric/Behavioral: Negative.       Objective:    Physical Exam Constitutional:      Appearance: Normal appearance. She is obese.  HENT:     Right Ear: Ear canal and external ear normal. There is no impacted cerumen.     Left Ear: Ear canal and external ear normal. There is no impacted cerumen.     Ears:     Comments: Left TM scarring;  Neurological:     Mental Status: She is alert.  Psychiatric:        Mood and Affect: Mood normal.        Behavior: Behavior normal.        Thought Content: Thought content normal.        Judgment: Judgment normal.     BP 131/79   Pulse 68   Temp 98.4 F (36.9 C)   Resp 20   Ht '5\' 5"'  (1.651 m)   Wt 234 lb (106.1 kg)   SpO2 96%   BMI 38.94 kg/m  Wt Readings from Last 3 Encounters:  10/21/20 234 lb (106.1 kg)  10/06/20 233 lb (105.7 kg)  09/10/20 235 lb 5 oz (106.7 kg)     Health Maintenance Due  Topic Date Due  . COVID-19 Vaccine (1) Never done  . PAP SMEAR-Modifier  12/01/2020    There are no preventive care reminders to display for this patient.  Lab Results  Component Value Date   TSH 0.523 03/06/2020   Lab Results  Component Value Date   WBC 10.0 06/18/2020   HGB 15.3 06/18/2020   HCT 46.5 06/18/2020   MCV 80 06/18/2020   PLT 275 06/18/2020   Lab Results  Component Value Date   NA 142 06/18/2020   K 3.8 06/18/2020   CO2 22  06/18/2020   GLUCOSE 78 06/18/2020   BUN 14  06/18/2020   CREATININE 0.78 06/18/2020   BILITOT 0.8 06/18/2020   ALKPHOS 69 06/18/2020   AST 21 06/18/2020   ALT 23 06/18/2020   PROT 7.6 06/18/2020   ALBUMIN 4.2 06/18/2020   CALCIUM 9.6 06/18/2020   ANIONGAP 9 05/20/2020   Lab Results  Component Value Date   CHOL 114 08/06/2020   Lab Results  Component Value Date   HDL 43 08/06/2020   Lab Results  Component Value Date   LDLCALC 54 08/06/2020   Lab Results  Component Value Date   TRIG 86 08/06/2020   Lab Results  Component Value Date   CHOLHDL 2.7 08/06/2020   Lab Results  Component Value Date   HGBA1C 5.6 03/05/2020      Assessment & Plan:   Problem List Items Addressed This Visit      Cardiovascular and Mediastinum   Essential hypertension   Relevant Orders   CBC with Differential/Platelet   CMP14+EGFR   Lipid Panel With LDL/HDL Ratio     Other   Dyslipidemia   Relevant Orders   Lipid Panel With LDL/HDL Ratio   Acute otalgia, bilateral - Primary    -unsure of etiology; I don't see fluid effusions that were present at last visit, but pain persists -R>L pain persisting despite prednisone and flonase -referral to ENT was initially denied d/t her insurance -will send in referral to different office; referral team notified      Relevant Orders   Ambulatory referral to ENT      No orders of the defined types were placed in this encounter.   Follow-up: Return in about 1 month (around 11/21/2020) for Lab follow-up (HTN, HLD).    Noreene Larsson, NP

## 2020-10-21 NOTE — Patient Instructions (Signed)
Please have fasting labs drawn 2-3 days prior to your appointment so we can discuss the results during your office visit.  

## 2020-10-22 ENCOUNTER — Encounter: Payer: Self-pay | Admitting: Gastroenterology

## 2020-10-22 ENCOUNTER — Ambulatory Visit (INDEPENDENT_AMBULATORY_CARE_PROVIDER_SITE_OTHER): Payer: Medicaid Other | Admitting: Gastroenterology

## 2020-10-22 ENCOUNTER — Telehealth: Payer: Self-pay

## 2020-10-22 VITALS — BP 131/75 | HR 80 | Temp 97.0°F | Ht 65.0 in | Wt 232.4 lb

## 2020-10-22 DIAGNOSIS — R109 Unspecified abdominal pain: Secondary | ICD-10-CM | POA: Diagnosis not present

## 2020-10-22 NOTE — Patient Instructions (Signed)
We are arranging a CT scan in the near future!  Please resume dicyclomine (Bentyl) two times a day (you can take this up to 4 times a day, before meals and at bedtime!) We are starting out on the lower end to see how you do. Monitor for signs of constipation, dry mouth, dizziness, and stop if this happens.  We will see you in 3-4 months!  I enjoyed seeing you again today! As you know, I value our relationship and want to provide genuine, compassionate, and quality care. I welcome your feedback. If you receive a survey regarding your visit,  I greatly appreciate you taking time to fill this out. See you next time!  Annitta Needs, PhD, ANP-BC [facility]

## 2020-10-22 NOTE — Telephone Encounter (Signed)
PA for CT abd/pelvis w/o contrast submitted via [employer] website. Case marked as urgent. Request tracking ID: [id number].

## 2020-10-22 NOTE — Telephone Encounter (Signed)
PA for CT abd/pelvis w/o contrast approved. PA# [id number], valid 10/22/20-12/21/20.

## 2020-10-22 NOTE — Telephone Encounter (Signed)
CT scheduled for 10/28/20 at 10:30am, arrive at 10:15am. NPO 4 hours prior to test. Pickup contrast prior to appt.  Called and informed pt of CT appt. Letter mailed.

## 2020-10-22 NOTE — Progress Notes (Signed)
Referring Provider: Noreene Larsson, NP Primary Care Physician:  Noreene Larsson, NP Primary GI: Dr. Abbey Chatters  Chief Complaint  Patient presents with  . Abdominal Pain    Lower abd, daily, comes/goes. Saltine crackers only thing that helps.  . Diarrhea    Loose stools regardless    HPI:   Joyce [name] is a 45 y.o. female presenting today with a history of lower abdominal pain and diarrhea following bout with COVID Jan 2022 (symptoms with COVID predominantly GI with N/V/D), completing stool studies at last visit with negative CDI but found to have indeterminate results on GI path panel except for Giardia, which was positive. Treated with course of metronidazole. Presents today in follow-up.  Despite chief complaint, she denies diarrhea on questioning and states she is back to her baseline prior to Covid. Continues with lower abdominal pain although better. Improved after BM. BMs twice a day, not watery. Abdominal pain relieved after BM. Abdominal pain returns thereafter. No fever/chills. Saltine crackers helps to relieve discomfort. Eating saltine crackers daily throughout the day. No rectal bleeding. No N/V. Not taking dicyclomine. Had taken one per day. Dicyclomine had helped when taking this. She is concerned about abdominal pain. States she had been taking high dose NSAIDs since October but now stopped. Worried about ulcer but has no UGI symptoms.   Past Medical History:  Diagnosis Date  . Acute CVA (cerebrovascular accident) ([place]) 03/04/2020  . Acute CVA (cerebrovascular accident) ([place]) 03/04/2020  . Acute left-sided weakness 03/05/2020  . AMA (advanced maternal age) multigravida 35+ 11/12/2014  . Anemia   . Depression   . [place] multiparity 11/26/2014  . Hypertension   . Migraine   . Pregnant 11/12/2014  . PTSD (post-traumatic stress disorder) 05/01/2017  . Round ligament pain 11/12/2014  . Short of breath on exertion 11/12/2014  . Trichomonas infection     Past Surgical  History:  Procedure Laterality Date  . CHOLECYSTECTOMY      Current Outpatient Medications  Medication Sig Dispense Refill  . amLODipine (NORVASC) 5 MG tablet TAKE 1 TABLET (5 MG TOTAL) BY MOUTH DAILY. 30 tablet 0  . aspirin 325 MG EC tablet Take 1 tablet (325 mg total) by mouth daily. 100 tablet 0  . aspirin EC 325 MG tablet TAKE 1 TABLET (325 MG TOTAL) BY MOUTH DAILY. 100 tablet 0  . atorvastatin (LIPITOR) 80 MG tablet TAKE 1 TABLET (80 MG TOTAL) BY MOUTH DAILY. 30 tablet 0  . dicyclomine (BENTYL) 10 MG capsule Take 1 capsule (10 mg total) by mouth 4 (four) times daily -  before meals and at bedtime. Stop if constipated 120 capsule 3  . escitalopram (LEXAPRO) 20 MG tablet TAKE 1 TABLET (20 MG TOTAL) BY MOUTH DAILY. 20 tablet 0  . fluticasone (FLONASE) 50 MCG/ACT nasal spray Use 2 sprays in each nostril BID for a week. After 1 week, decrease to 1 spray in each nostril BID as needed for congestion/allergies. 16 g 6  . gabapentin (NEURONTIN) 300 MG capsule TAKE 1 CAPSULE BY MOUTH TWICE DAILY AND 2 CAPSULES EVERY NIGHT AT BEDTIME 120 capsule 1  . ibuprofen (ADVIL) 600 MG tablet Take 1 tablet (600 mg total) by mouth every 6 (six) hours as needed. 30 tablet 0  . methocarbamol (ROBAXIN) 500 MG tablet Take 1 tablet (500 mg total) by mouth every 8 (eight) hours as needed for muscle spasms. 90 tablet 1  . pantoprazole (PROTONIX) 40 MG tablet Take 1 tablet (40 mg total) by  mouth daily. 30 tablet 1  . PARAGARD INTRAUTERINE COPPER IU by Intrauterine route.    . topiramate (TOPAMAX) 50 MG tablet Take 1 tablet (50 mg total) by mouth 2 (two) times daily. Start 1 tablet daily x 1 week and then twice daily 60 tablet 3  . traZODone (DESYREL) 100 MG tablet Take 100 mg by mouth at bedtime.     No current facility-administered medications for this visit.    Allergies as of 10/22/2020  . (No Known Allergies)    Family History  Problem Relation Age of Onset  . Diabetes Mother   . Hypertension Mother   .  Kidney disease Mother   . Asthma Mother   . Hyperlipidemia Mother   . Alzheimer's disease Father   . Diabetes Father   . Kidney disease Sister   . Asthma Son   . Diabetes Sister   . Asthma Son   . ADD / ADHD Son   . Heart murmur Son   . Asthma Son   . Colon cancer Neg Hx   . Colon polyps Neg Hx     Social History   Socioeconomic History  . Marital status: Significant Other    Spouse name: [name]  . Number of children: 8  . Years of education: 29  . Highest education level: Not on file  Occupational History  . Occupation: unemployed    Comment: disabled  Tobacco Use  . Smoking status: Never Smoker  . Smokeless tobacco: Never Used  Vaping Use  . Vaping Use: Never used  Substance and Sexual Activity  . Alcohol use: Not Currently    Comment: occasionally  . Drug use: No  . Sexual activity: Yes    Birth control/protection: I.U.D.    Comment: followed by GYN  Other Topics Concern  . Not on file  Social History Narrative   Lives with fiance and kids   Right Handed   Drinks >12 cans of soda in caffeine   Social Determinants of Health   Financial Resource Strain: Not on file  Food Insecurity: Not on file  Transportation Needs: Not on file  Physical Activity: Not on file  Stress: Not on file  Social Connections: Not on file    Review of Systems: Gen: Denies fever, chills, anorexia. Denies fatigue, weakness, weight loss.  CV: Denies chest pain, palpitations, syncope, peripheral edema, and claudication. Resp: Denies dyspnea at rest, cough, wheezing, coughing up blood, and pleurisy. GI: see HPI Derm: Denies rash, itching, dry skin Psych: Denies depression, anxiety, memory loss, confusion. No homicidal or suicidal ideation.  Heme: Denies bruising, bleeding, and enlarged lymph nodes.  Physical Exam: BP 131/75   Pulse 80   Temp (!) 97 F (36.1 C)   Ht 5\' 5"  (1.651 m)   Wt 232 lb 6.4 oz (105.4 kg)   LMP 10/20/2020   BMI 38.67 kg/m  General:   Alert and  oriented. No distress noted. Pleasant and cooperative.  Head:  Normocephalic and atraumatic. Eyes:  Conjuctiva clear without scleral icterus. Mouth:  Mask in place  Abdomen:  +BS, soft, TTP RLQ and suprapubic moreso than LLQ and non-distended. No rebound or guarding. No HSM or masses noted. Msk:  Left-sided weakness following CVA last year, uses walker for ambulation  Extremities:  Without edema. Neurologic:  Alert and  oriented x4 Psych:  Alert and cooperative. Normal mood and affect.  ASSESSMENT: LOZA PRELL is a 45 y.o. female presenting today with history of abdominal pain and diarrhea following bout  with Covid in Jan 2022, with stool studies completed in March 2022 negative for CDI but positive for Giardia, which was treated with course of metronidazole.  Clinically improved with resolution of diarrhea, noting she is near to her baseline of 1-2 BMs per day. Abdominal cramping persists although improved, and is relieved after BMs. 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 She is concerned about an occult process and would desire imaging, which we can certainly pursue. In interim, recommending resuming dicyclomine twice a day and titrate up to four times per day if tolerated.    PLAN:  CT abdomen/pelvis Dicyclomine for supportive measures Stool studies if recurrent diarrhea 3-4 months returns  Annitta Needs, PhD, [facility]

## 2020-10-28 ENCOUNTER — Ambulatory Visit (HOSPITAL_COMMUNITY)
Admission: RE | Admit: 2020-10-28 | Discharge: 2020-10-28 | Disposition: A | Discharge status: Home / Self Care | Payer: Medicaid Other | Source: Ambulatory Visit | Attending: Gastroenterology | Admitting: Gastroenterology

## 2020-10-28 DIAGNOSIS — G894 Chronic pain syndrome: Secondary | ICD-10-CM | POA: Diagnosis not present

## 2020-10-28 DIAGNOSIS — N838 Other noninflammatory disorders of ovary, fallopian tube and broad ligament: Secondary | ICD-10-CM | POA: Diagnosis not present

## 2020-10-28 DIAGNOSIS — R109 Unspecified abdominal pain: Secondary | ICD-10-CM | POA: Insufficient documentation

## 2020-10-28 DIAGNOSIS — R194 Change in bowel habit: Secondary | ICD-10-CM | POA: Diagnosis not present

## 2020-10-28 DIAGNOSIS — R1013 Epigastric pain: Secondary | ICD-10-CM | POA: Diagnosis not present

## 2020-10-28 IMAGING — CT CT ABD-PELV W/O CM
2 of 4 series · 16 of 46 positions shown, 18 images · non-contrast
Comparison: [DATE]

CLINICAL DATA: Chronic abdominal pain with change in bowel habits.
Epigastric pain for 1 year.

EXAM:
CT ABDOMEN AND PELVIS WITHOUT CONTRAST
TECHNIQUE: Multidetector CT imaging of the abdomen and pelvis was performed
following the standard protocol without IV contrast.

[Series 2: axial st · axial · 0.70mm/px · z∈[+964,+1384]mm · 13 of 96 slices shown, 15 images]
[im 6/96  soft-tissue]
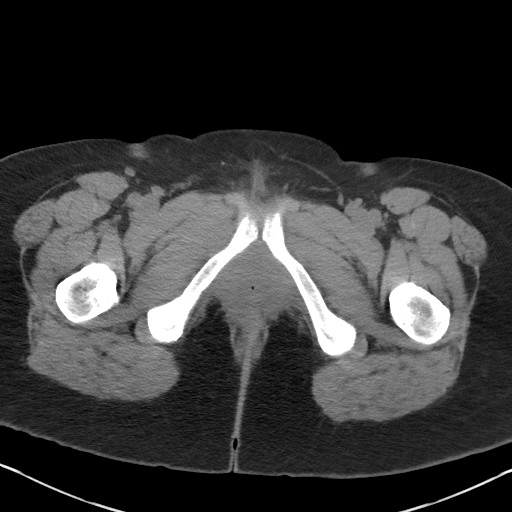
[im 6/96  bone]
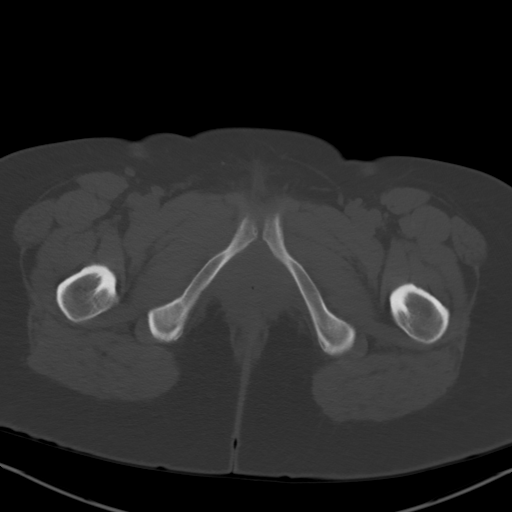
[im 11/96  soft-tissue]
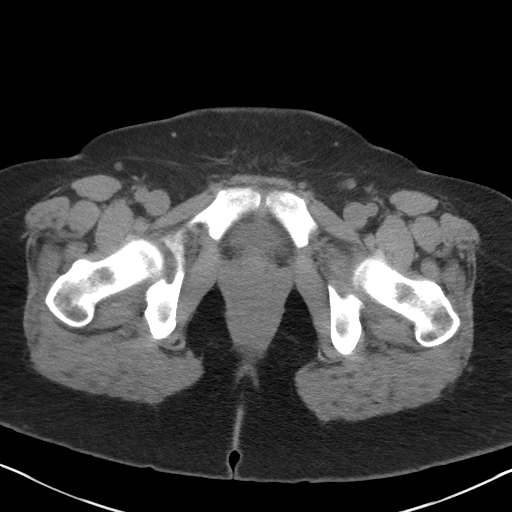
[im 22/96  soft-tissue]
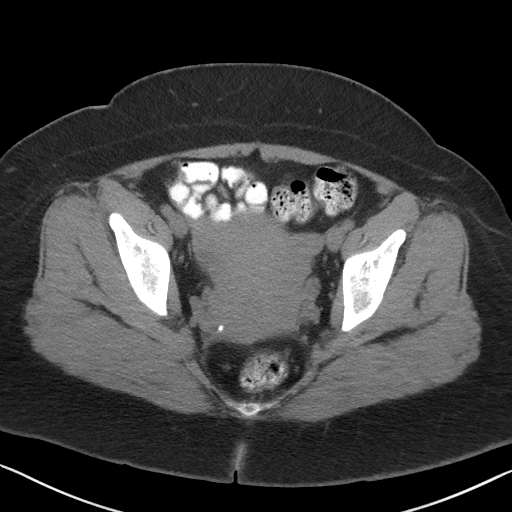
[im 27/96  soft-tissue]
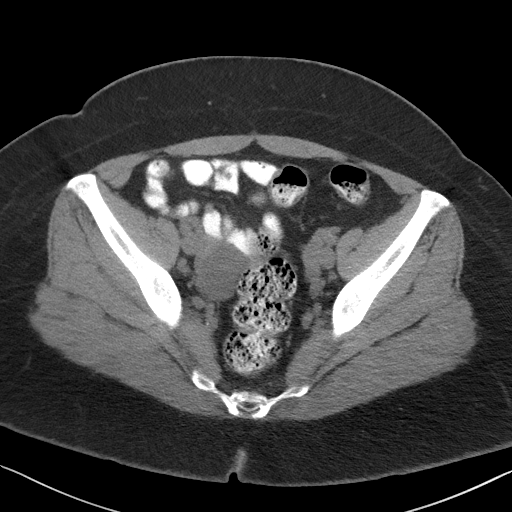
[im 32/96  soft-tissue]
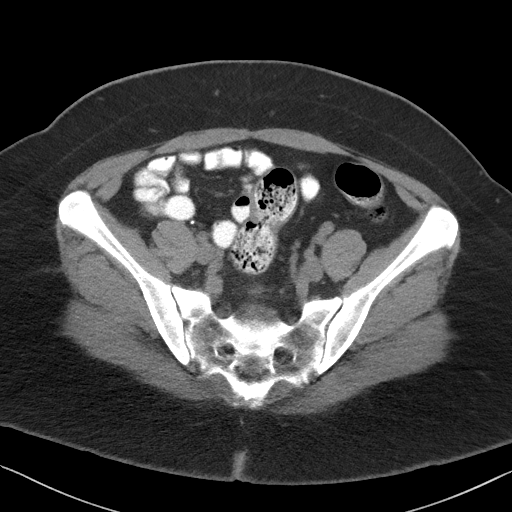
[im 43/96  soft-tissue]
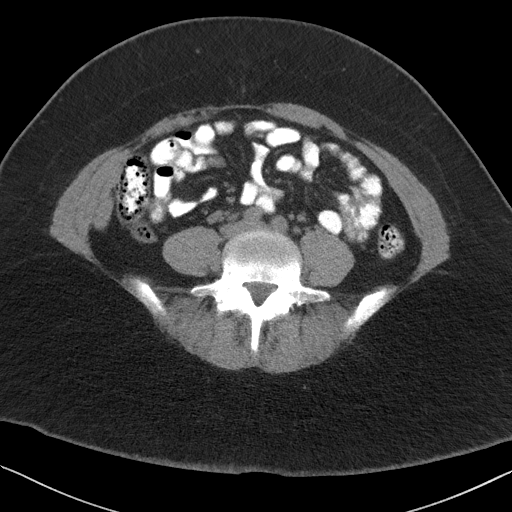
[im 48/96  soft-tissue]
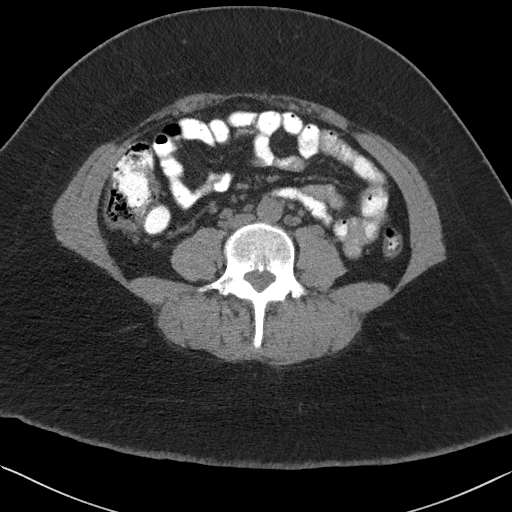
[im 53/96  soft-tissue]
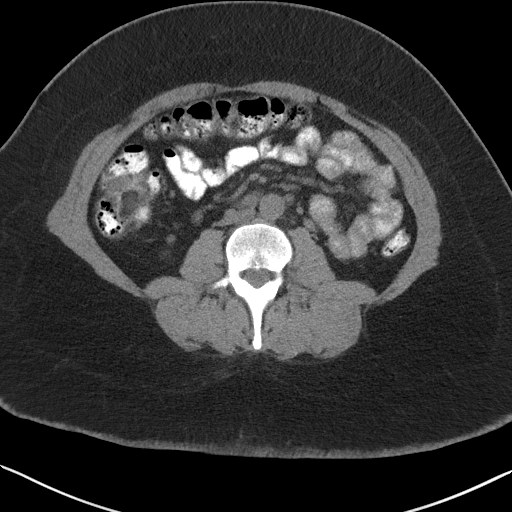
[im 64/96  soft-tissue]
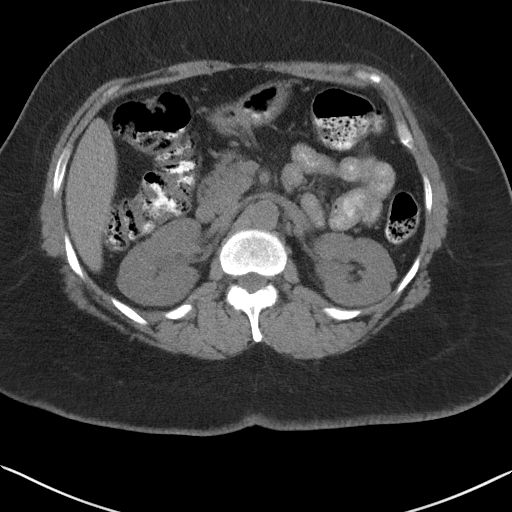
[im 64/96  bone]
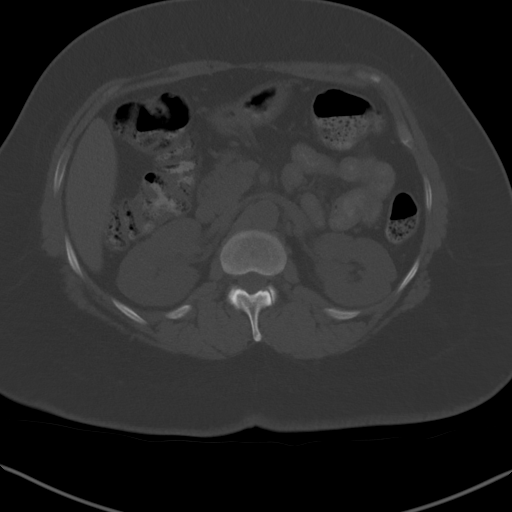
[im 69/96  soft-tissue]
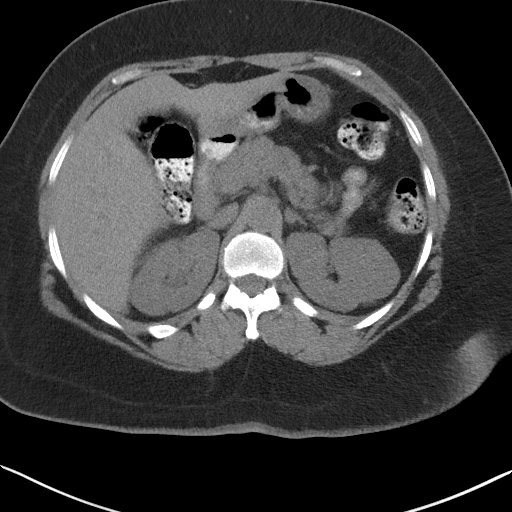
[im 74/96  soft-tissue]
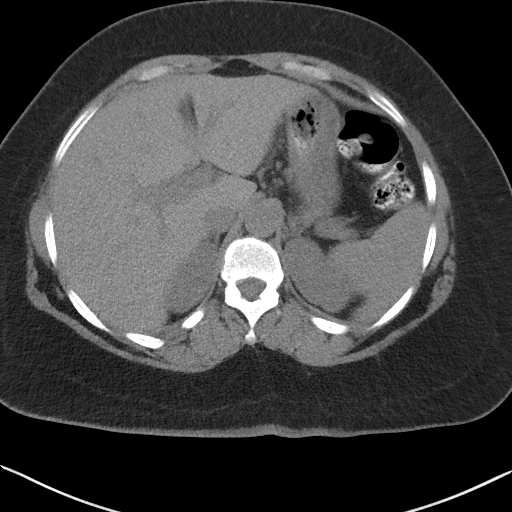
[im 85/96  soft-tissue]
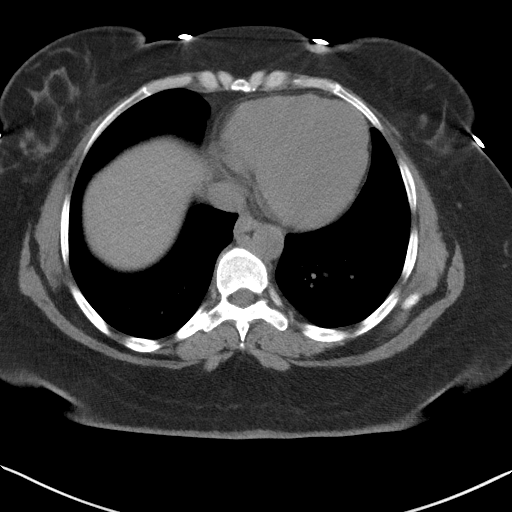
[im 90/96  soft-tissue]
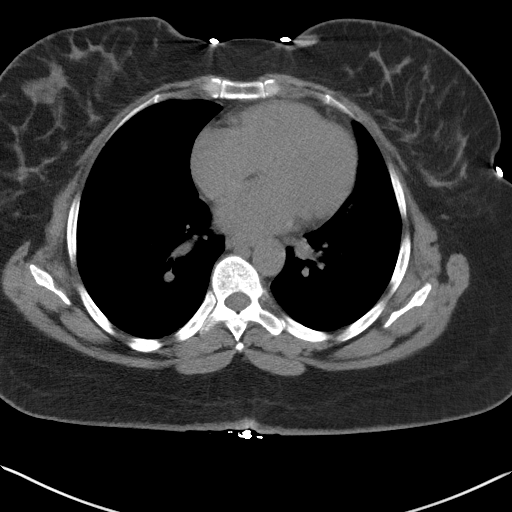

[Series 5: coronal st · coronal · 0.76mm/px · 3 of 110 slices shown]
[im 37/110  soft-tissue]
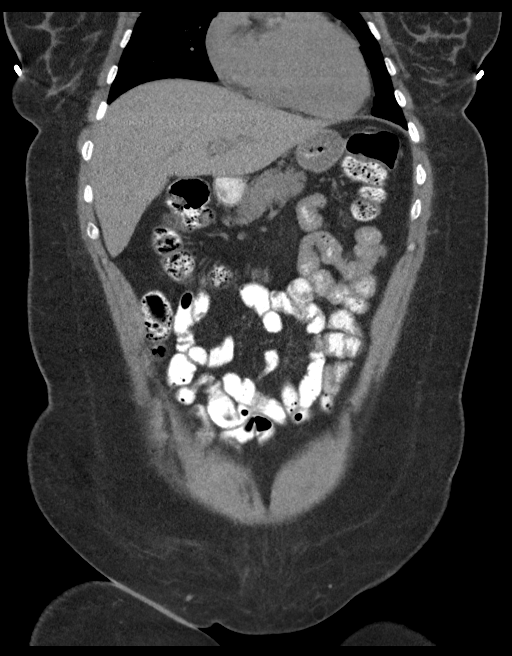
[im 49/110  soft-tissue]
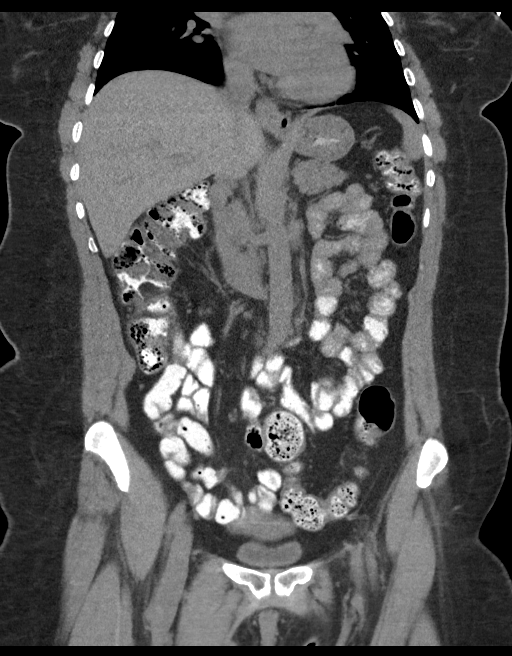
[im 61/110  soft-tissue]
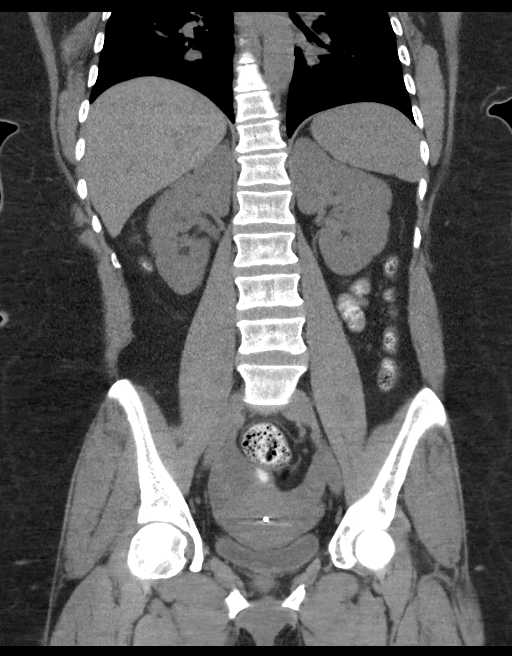

[16 of 46 positions shown; findings below may reference images not displayed]

FINDINGS: Lower chest: Unremarkable

Hepatobiliary: Cholecystectomy.  Otherwise unremarkable.

Pancreas: Unremarkable

Spleen: Unremarkable

Adrenals/Urinary Tract: Unremarkable

Stomach/Bowel: Unremarkable

Vascular/Lymphatic: Scattered small retroperitoneal lymph nodes are
not pathologically enlarged by size criteria. Ileocolic lymph node
0.8 cm in short axis on image 45 series 2, previously 0.7 cm.

Reproductive: IUD position centrally along the myometrium. Hypodense
right ovarian/adnexal lesion 3.5 by 4.3 cm on image 67 series 6 with
internal density of 18 Hounsfield units.

Other: No supplemental non-categorized findings.

Musculoskeletal: Unremarkable
IMPRESSION: 1. Upper normal sized ileocolic and retroperitoneal lymph nodes
without overt pathologic adenopathy identified.
2. Hypodense approximately 4 cm right adnexal lesion, mildly higher
than fluid density. This lesion is technically nonspecific and not
felt to be simple. This lesion could be further characterized with
pelvic sonography if clinically warranted.

## 2020-10-28 NOTE — Telephone Encounter (Signed)
I called pt. I advised pt that Dr. Rexene Alberts reviewed pt's sleep study and found that no significant osa was noted. Dr. Rexene Alberts recommends that pt pursue weight loss to help with snoring and keep f/u as scheduled with PCP and Dr. Leonie Man. I reviewed sleep hygiene recommendations with the pt, including trying to keep a regular sleep wake schedule, avoiding electronics in the bedroom, keeping the bedroom cool, dark, and quiet, and avoiding eating or exercising within 2 hours of bedtime as well as eating in the middle of the night. I advised pt to keep pets out of the bedroom. I discussed with pt the importance of stress relief and to try meditation, deep breathing exercises, and/or a white noise machine or fan to diffuse other noise distractors. I advised pt to not drink alcohol before bedtime and to never mix alcohol and sedating medications. Pt was advised to avoid narcotic pain medication close to bedtime. I advised pt that a copy of these sleep study results will be sent to Dr. Leonie Man. Pt verbalized understanding of results. Pt had no questions at this time but was encouraged to call back if questions arise.

## 2020-10-29 ENCOUNTER — Other Ambulatory Visit: Payer: Self-pay | Admitting: *Deleted

## 2020-10-29 DIAGNOSIS — N83209 Unspecified ovarian cyst, unspecified side: Secondary | ICD-10-CM

## 2020-11-03 ENCOUNTER — Ambulatory Visit (HOSPITAL_COMMUNITY)
Admission: RE | Admit: 2020-11-03 | Discharge: 2020-11-03 | Disposition: A | Discharge status: Home / Self Care | Payer: Medicaid Other | Source: Ambulatory Visit | Attending: Gastroenterology | Admitting: Gastroenterology

## 2020-11-03 ENCOUNTER — Other Ambulatory Visit: Payer: Self-pay

## 2020-11-03 DIAGNOSIS — N83209 Unspecified ovarian cyst, unspecified side: Secondary | ICD-10-CM | POA: Insufficient documentation

## 2020-11-03 DIAGNOSIS — N83201 Unspecified ovarian cyst, right side: Secondary | ICD-10-CM | POA: Diagnosis not present

## 2020-11-03 IMAGING — US US PELVIS COMPLETE WITH TRANSVAGINAL
1 series · 13 of 25 positions shown · non-contrast
Comparison: Prior CT from [DATE].

CLINICAL DATA: Follow-up right adnexal lesion seen on recent CT.



[Series 1: us pelvic complete with transvaginal · 146 acquisitions, 13 frames shown]
[im 1/146]
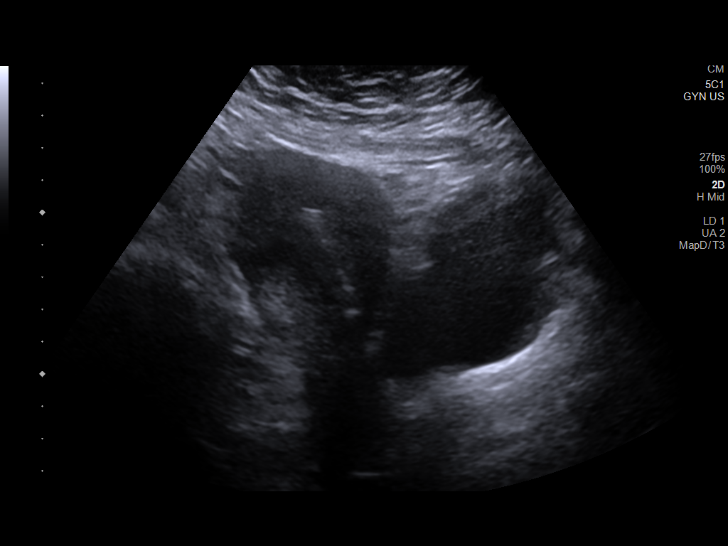
[im 13/146]
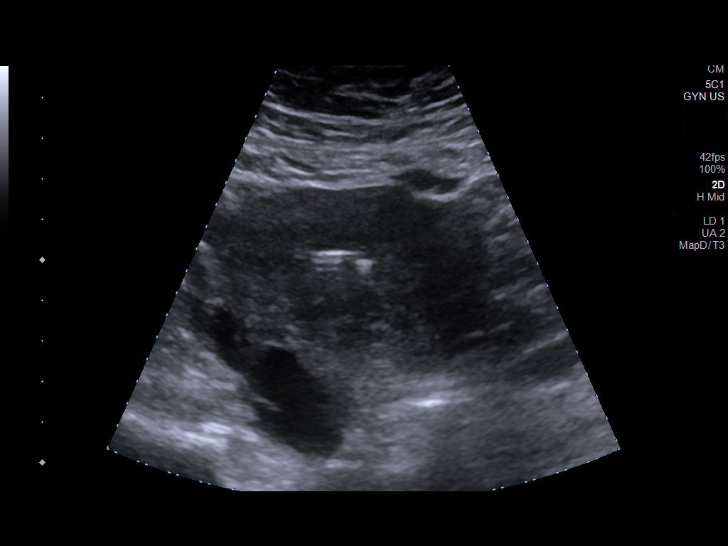
[im 25/146]
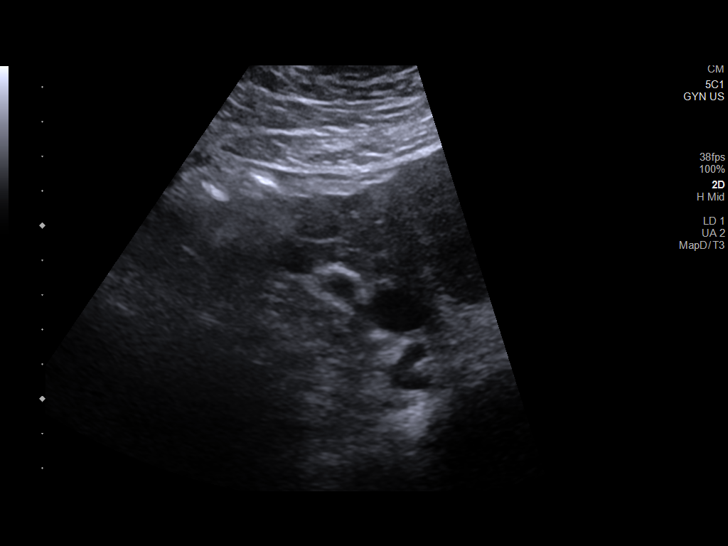
[im 37/146]
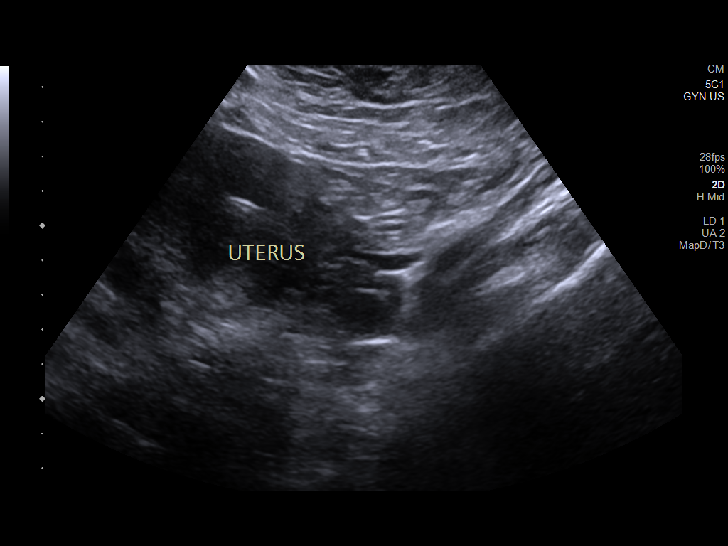
[im 49/146]
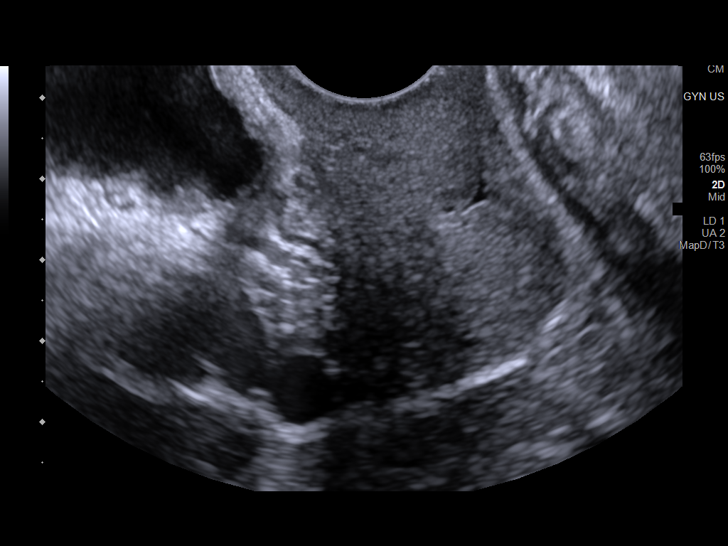
[im 61/146]
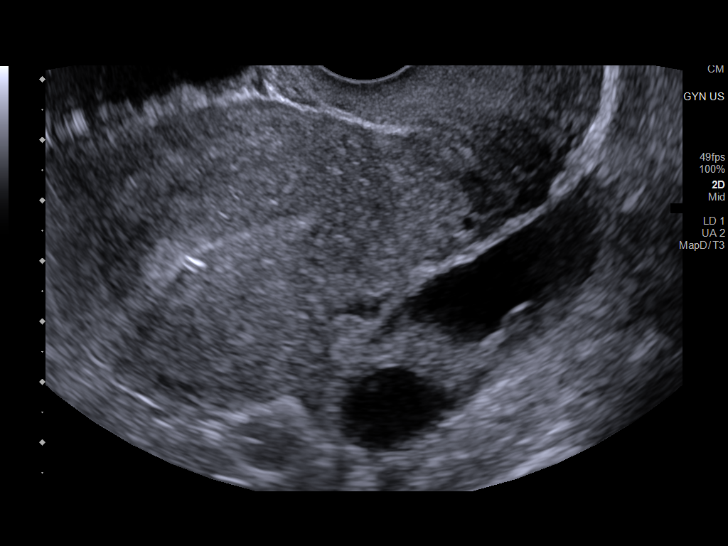
[im 73/146]
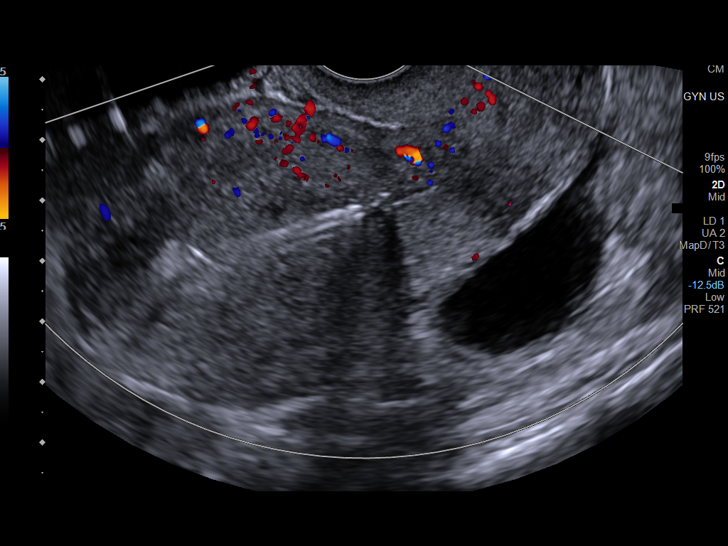
[im 85/146]
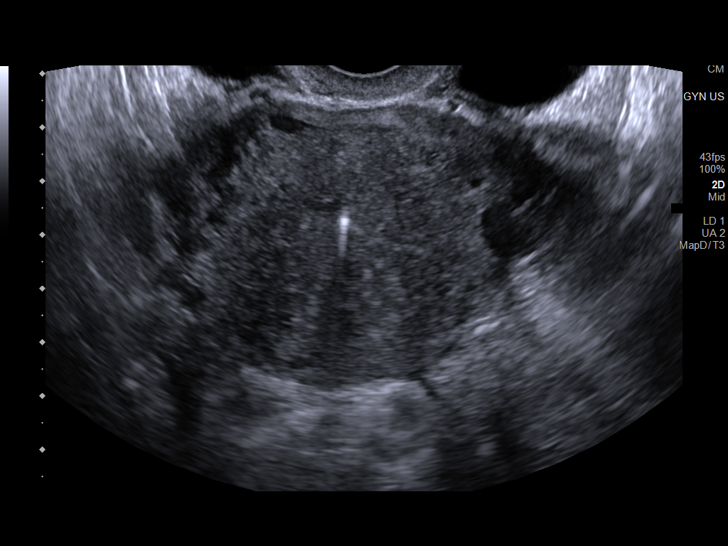
[im 97/146]
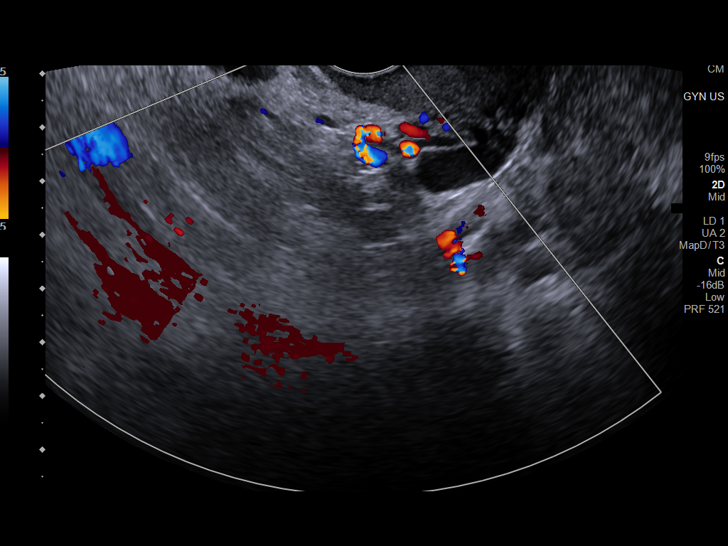
[im 109/146]
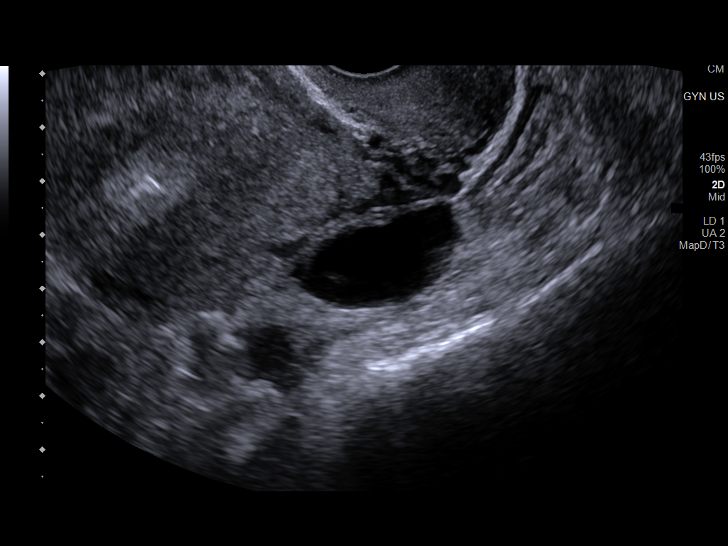
[im 121/146]
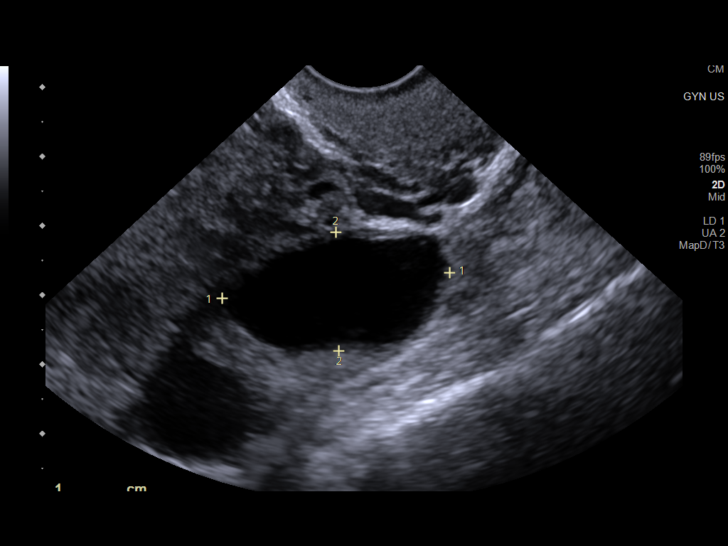
[im 133/146]
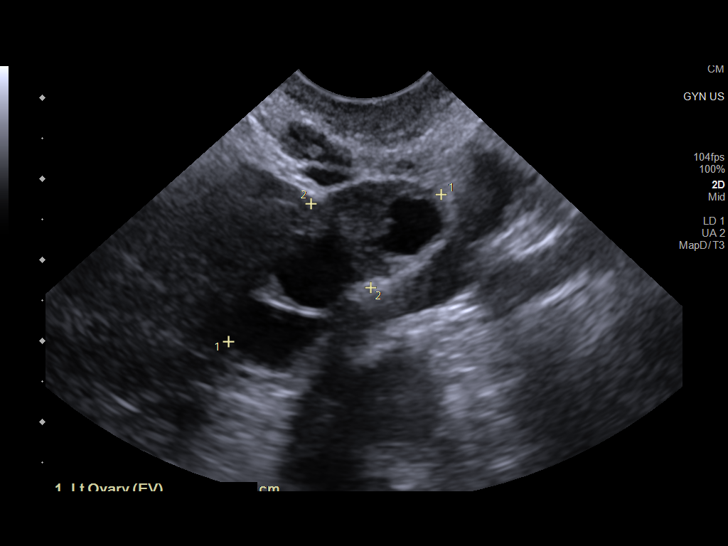
[im 146/146]
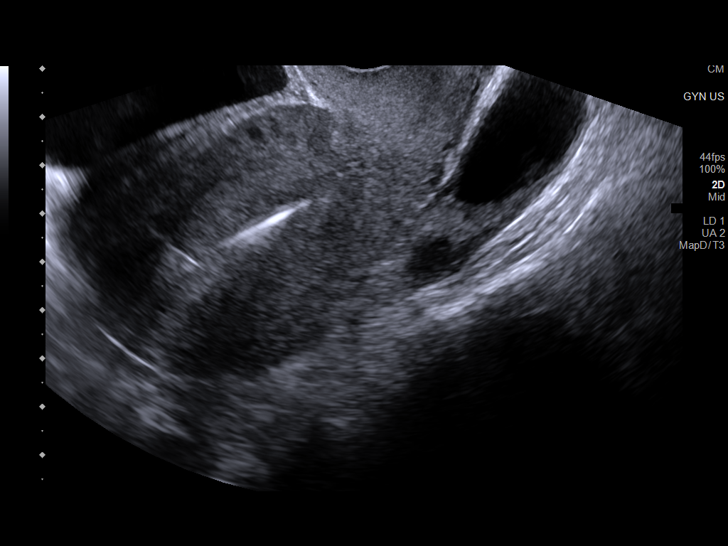

[13 of 25 positions shown; findings below may reference images not displayed]

FINDINGS: Uterus

Measurements: 9.4 x 4.9 x 6.0 cm. Uterus is anteverted. No discrete
fibroid or other mass.

Endometrium

IUD in appropriate position within the endometrial cavity. Adjacent
endometrial stripe obscured by the IUD. No definite focal
abnormality.

Right ovary

Measurements: 5.4 x 2.1 x 3.9 cm = volume: 23.1 mL. 3.3 x 1.7 x
cm predominantly simple anechoic cyst seen arising from the right
ovary. Single internal daughter cyst noted. No other significant
internal complexity. No vascularity or solid component.

Left ovary

Measurements: 3.2 x 1.3 x 1.6 cm = volume: 3.3 mL. Normal
appearance/no adnexal mass.

Other findings

No abnormal free fluid.
IMPRESSION: 1. 3.3 cm simple right ovarian cyst with small internal daughter
cyst, decreased in size as compared to recent CT from [DATE].
This has benign features, and likely reflects a normal physiologic
follicular cyst. No follow-up imaging recommended. Note: This
recommendation does not apply to premenarchal patients or to those
with increased risk (genetic, family history, elevated tumor markers
or other high-risk factors) of ovarian cancer. Reference: Radiology
[DATE]):[id number].
2. IUD in appropriate position within the endometrial cavity.
Endometrial complex not well evaluated due to adjacent IUD.
3. Normal uterus and left ovary. No other pelvic or adnexal mass. No
free fluid.

## 2020-11-11 ENCOUNTER — Ambulatory Visit (HOSPITAL_COMMUNITY): Payer: Medicaid Other | Attending: Physical Medicine & Rehabilitation | Admitting: Physical Therapy

## 2020-11-11 ENCOUNTER — Other Ambulatory Visit: Payer: Self-pay

## 2020-11-11 ENCOUNTER — Encounter (HOSPITAL_COMMUNITY): Payer: Self-pay | Admitting: Physical Therapy

## 2020-11-11 DIAGNOSIS — M6281 Muscle weakness (generalized): Secondary | ICD-10-CM | POA: Insufficient documentation

## 2020-11-11 DIAGNOSIS — M545 Low back pain, unspecified: Secondary | ICD-10-CM

## 2020-11-11 DIAGNOSIS — R2689 Other abnormalities of gait and mobility: Secondary | ICD-10-CM | POA: Insufficient documentation

## 2020-11-11 NOTE — Therapy (Signed)
[place], [place], [address] Phone: [phone]   Fax:  [phone]  Physical Therapy Evaluation  Patient Details  Name: MALITA [name] MRN: [id number] Date of Birth: 1975-07-29 Referring Provider (PT): Alger Simons MD   Encounter Date: 11/11/2020   PT End of Session - 11/11/20 1108     Visit Number 1    Number of Visits 8    Date for PT Re-Evaluation 12/09/20    Authorization Type Medicaid [place] [employer] (6 therapy visits shown as used from previous episode)    Authorization Time Period Check auth    PT Start Time 1036    PT Stop Time 1115    PT Time Calculation (min) 39 min    Activity Tolerance Patient tolerated treatment well    Behavior During Therapy [facility] for tasks assessed/performed             Past Medical History:  Diagnosis Date   Acute CVA (cerebrovascular accident) ([place]) 03/04/2020   Acute CVA (cerebrovascular accident) ([place]) 03/04/2020   Acute left-sided weakness 03/05/2020   AMA (advanced maternal age) multigravida 44+ 11/12/2014   Anemia    Depression    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   Hypertension    Migraine    Pregnant 11/12/2014   PTSD (post-traumatic stress disorder) 05/01/2017   Round ligament pain 11/12/2014   Short of breath on exertion 11/12/2014   Trichomonas infection     Past Surgical History:  Procedure Laterality Date   CHOLECYSTECTOMY      There were no vitals filed for this visit.    Subjective Assessment - 11/11/20 1043     Subjective Patient presents to therapy with complaint of low back pain. This is worse when she walks long distance. She has been treated in this clinic for balance and gait previously. She says her back has been bothering her since her stroke in October. She says this is slightly better than it was but still bothers her.    Pertinent History HTN, Migraine, PTSD, Acute CVA 03/04/20    Limitations Standing;Walking;House hold activities    How  long can you walk comfortably? about 45-60 minutes    Patient Stated Goals Be able to exercise when having pain, know what to do for pain    Currently in Pain? No/denies   9/10 at worst with walking, no pain currenlty at rest               [facility] PT Assessment - 11/11/20 0001       Assessment   Medical Diagnosis LBP    Referring Provider (PT) Alger Simons MD    Onset Date/Surgical Date 03/07/20    Prior Therapy Yes      Precautions   Precautions Fall      Balance Screen   Has the patient fallen in the past 6 months Yes    How many times? 1    Has the patient had a decrease in activity level because of a fear of falling?  Yes    Is the patient reluctant to leave their home because of a fear of falling?  No      Home Social worker Private residence    Living Arrangements Children;Spouse/significant other;Other relatives    Available Help at Discharge Family      Prior Function   Level of Independence Independent      Cognition   Overall Cognitive Status Within Functional Limits for tasks assessed  Strength   Right Hip Flexion 4/5    Right Hip Extension 3-/5    Right Hip ABduction 4/5    Left Hip Flexion 4-/5    Left Hip Extension 3-/5    Left Hip ABduction 3+/5    Right Knee Extension 4+/5    Left Knee Extension 4-/5    Right Ankle Dorsiflexion 4+/5    Left Ankle Dorsiflexion 4/5      Palpation   Palpation comment Mod TTP about bilateral lumbar paraspinals      Transfers   Five time sit to stand comments  2 x with no UEs in 28 sec      Ambulation/Gait   Ambulation/Gait Yes    Ambulation/Gait Assistance 6: Modified independent (Device/Increase time);5: Supervision    Assistive device Rollator    Gait Pattern Decreased dorsiflexion - left;Poor foot clearance - left;Trunk flexed;Decreased stride length;Decreased step length - right;Decreased step length - left    Ambulation Surface Level;Indoor    Gait velocity decreased                         Objective measurements completed on examination: See above findings.       [place] Adult PT Treatment/Exercise - 11/11/20 0001       Exercises   Exercises Lumbar      Lumbar Exercises: Supine   Ab Set 10 reps    Other Supine Lumbar Exercises hip abduction iso 10 x 5"                    PT Education - 11/11/20 1046     Education Details on evaluation findings, POC and HEP    Person(s) Educated Patient    Methods Explanation;Handout    Comprehension Verbalized understanding              PT Short Term Goals - 11/11/20 1111       PT SHORT TERM GOAL #1   Title Patient will be independent with initial HEP and self-management strategies to improve functional outcomes    Time 2    Period Weeks    Status New    Target Date 11/25/20               PT Long Term Goals - 11/11/20 1119       PT LONG TERM GOAL #1   Title Patient will have equal to or > 4/5 MMT throughout BLE to improve ability to perform functional mobility, stair ambulation and ADLs.    Time 4    Period Weeks    Status New    Target Date 12/09/20      PT LONG TERM GOAL #2   Title Patient will be able to ambulate at least 250 feet during 2MWT with LRAD to demonstrate improved ability to perform functional mobility and associated tasks.    Time 4    Period Weeks    Status New    Target Date 12/09/20      PT LONG TERM GOAL #3   Title Patient will report at least 70% overall improvement in subjective complaint to indicate improvement in ability to perform ADLs.    Time 4    Period Weeks    Status New    Target Date 12/09/20      PT LONG TERM GOAL #4   Title Patient will be able to stand > 60 minutes with pain not to exceed 3/10 in lumbar to improve ability  to perform cooking and grooming ADLs.    Time 4    Period Weeks    Status New    Target Date 12/09/20                    Plan - 11/11/20 1110     Clinical Impression Statement Patient is a  45 y.o. female who presents to physical therapy with complaint of LBP. Patient demonstrates decreased strength, ROM restriction, increased tenderness to palpation and gait abnormalities which are likely contributing to symptoms of pain and are negatively impacting patient ability to perform ADLs and functional mobility tasks. Patient will benefit from skilled physical therapy services to address these deficits to reduce pain, improve level of function with ADLs, functional mobility tasks, and reduce risk for falls.    Personal Factors and Comorbidities Comorbidity 1;Comorbidity 2    Comorbidities migraines, HTN    Examination-Activity Limitations Locomotion Level;Lift;Transfers;Toileting;Stand;Stairs;Squat;Carry;Bathing    Examination-Participation Restrictions Cleaning;Community Activity;Meal Prep;Shop    Stability/Clinical Decision Making Stable/Uncomplicated    Clinical Decision Making Low    Rehab Potential Good    PT Frequency 2x / week    PT Duration 4 weeks    PT Treatment/Interventions ADLs/Self Care Home Management;Aquatic Therapy;Cryotherapy;Electrical Stimulation;Moist Heat;Traction;Balance training;Therapeutic exercise;Therapeutic activities;Functional mobility training;Stair training;Gait training;DME Instruction;Neuromuscular re-education;Patient/family education;Orthotic Fit/Training;Manual techniques;Energy conservation;Dry needling;Passive range of motion;Biofeedback;Spinal Manipulations;Joint Manipulations;Splinting;Taping;Vasopneumatic Device;Scar mobilization;Ultrasound;Parrafin;Compression bandaging    PT Next Visit Plan Review goals and HEP. Progress hip and core strength as tolerated. Manuals as needed.    PT Home Exercise Plan Eval: ab set, hip abduction iso    Consulted and Agree with Plan of Care Patient             Patient will benefit from skilled therapeutic intervention in order to improve the following deficits and impairments:  Improper body mechanics, Decreased  range of motion, Decreased balance, Decreased mobility, Difficulty walking, Pain, Decreased strength, Decreased activity tolerance, Abnormal gait, Decreased endurance, Increased fascial restricitons, Impaired flexibility  Visit Diagnosis: Low back pain, unspecified back pain laterality, unspecified chronicity, unspecified whether sciatica present  Other abnormalities of gait and mobility     Problem List Patient Active Problem List   Diagnosis Date Noted   Acute otalgia, bilateral 10/06/2020   Diarrhea 08/19/2020   Chronic left-sided low back pain without sciatica 07/01/2020   Abdominal pain 06/19/2020   History of CVA with residual deficit 05/15/2020   Neuropathic pain 05/06/2020   Encounter for IUD insertion 04/20/2020   Anxiety state    Dyslipidemia    Labile blood pressure    Migraine without status migrainosus, not intractable    Right thalamic infarction ([place]) 03/10/2020   Left hemiparesis ([place]) 03/10/2020   Recurrent falls 03/05/2020   MDD (major depressive disorder), recurrent episode, moderate ([place]) 05/01/2017   PTSD (post-traumatic stress disorder) 05/01/2017   Depression, recurrent ([place]) 12/07/2016   Essential hypertension 07/20/2016   Uterine fibroid 01/27/2016   Cervical high risk HPV (human papillomavirus) test positive 12/08/2014   11:24 AM, 11/11/20 Josue Hector PT DPT  Physical Therapist with [facility]  [phone] [place] [address] [place], [place], [address] Phone: [phone]   Fax:  [phone]  Name: LODIE WAHEED MRN: [id number] Date of Birth: [birth year]

## 2020-11-11 NOTE — Patient Instructions (Signed)
Access Code: [facility] URL: [email or web address] Date: 11/11/2020 Prepared by: Josue Hector  Exercises Supine Transversus Abdominis Bracing - Hands on Stomach - 2-3 x daily - 7 x weekly - 2 sets - 10 reps - 5 second hold Hooklying Isometric Hip Abduction with Belt - 2-3 x daily - 7 x weekly - 2 sets - 10 reps - 5 second hold

## 2020-11-16 ENCOUNTER — Ambulatory Visit: Payer: Medicaid Other | Admitting: Adult Health

## 2020-11-16 ENCOUNTER — Encounter: Payer: Self-pay | Admitting: Adult Health

## 2020-11-16 ENCOUNTER — Ambulatory Visit (HOSPITAL_COMMUNITY): Payer: Medicaid Other | Admitting: Physical Therapy

## 2020-11-16 NOTE — Progress Notes (Deleted)
Joyce [name] [address] [place] [address] [phone]       OFFICE CONSULT NOTE  [name] Date of Birth:  05/19/1976 Medical Record Number:  [id number]   Referring MD: Merrily Pew PA-C  Reason for Referral: Stroke    HPI:   Today, 11/16/2020, Joyce [name] returns for 74-monthstroke follow-up.  She reports continued left-sided paresthesias *** and remains on topiramate 50 mg twice daily tolerating without side effects.  Denies new or worsening stroke/TIA symptoms.  Reports compliance on aspirin and atorvastatin for secondary stroke prevention without associated side effects.  Blood pressure today ***.  Underwent sleep evaluation for possible sleep apnea with sleep study 10/09/2020 negative for significant apnea.        History provided for reference purposes only Consult visit 08/06/2020 Dr. SLeonie Man [name] a 45year old pleasant African-American lady seen today for initial office consultation visit for stroke.  History is obtained from the patient, review of electronic medical records and I personally reviewed pertinent available imaging films in PACS.  She has past medical history of hypertension, hyperlipidemia, depression, migraines who presented initially initially on 03/04/2020 with sudden onset of left-sided weakness and numbness.  She presented outside time window for TPA. [facility]  MRI scan of the brain showed a small 8 mm right lateral thalamic acute lacunar infarct.  There are mild changes of small vessel disease.  MR angiogram of the brain showed no significant large vessel stenosis.  Carotid ultrasound showed no significant extracranial stenosis.  Transthoracic echo showed normal ejection fraction of 60 to 65% without cardiac source of embolism.  LDL cholesterol was 96 mg percent and hemoglobin A1c was 5.6.  ANA panel was negative antiphospholipid  antibodies were negative.  Homocysteine level was normal.  RPR was negative.  ESR was normal.  Patient was started on aspirin Plavix for 3 weeks and subsequently switched to aspirin alone.  She did well with the regaining strength on the left side but has residual paresthesias still.  She is currently doing outpatient physical occupational therapy.  She returned to the ER on 05/19/2020 with headache as well as back pain.  Repeat MRI of the brain showed no acute abnormality.  Patient states she remains on aspirin she is tolerating well without bleeding or bruising.  Blood pressures well controlled today it is 133/81.  She was on hormonal injections 3 times a week at the time of the stroke since then her gynecologist/changes to hormonal implant.  Her migraines are much improved and now occur around once a month or so.  She is on low-dose Topamax 25 mg daily for migraine prevention and tolerating it well without side effects.  She does admit to snoring and having disturbed sleep but she has never been evaluated for sleep apnea  ROS:   14 system review of systems is positive for back pain, difficulty walking, numbness, tingling and weakness in all other systems negative  PMH:  Past Medical History:  Diagnosis Date   [place]) [place] 03/04[place]   Acute CV[place] ([place]vascular accident) ([place] 03/04/2020   Acute left-sided weakness 03/05/2020   AMA (advanced maternal age) multigravida 35+ 11/12/2014   Anemia    Depression    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   Hypertension    Migraine    Pregnant 11/12/2014   PTSD (post-traumatic stress disorder) 05/01/2017   Round ligament pain 11/12/2014   Short  of breath on exertion 11/12/2014   Trichomonas infection     Social History:  Social History   Socioeconomic History   Marital status: Significant Other    Spouse name: Joyce [name]   Number of children: 8   Years of education: 9   Highest education level: Not on file  Occupational History    Occupation: unemployed    Comment: disabled  Tobacco Use   Smoking status: Never   Smokeless tobacco: Never  Vaping Use   Vaping Use: Never used  Substance and Sexual Activity   Alcohol use: Not Currently    Comment: occasionally   Drug use: No   Sexual activity: Yes    Birth control/protection: I.U.D.    Comment: followed by GYN  Other Topics Concern   Not on file  Social History Narrative   Lives with fiance and kids   Right Handed   Drinks >12 cans of soda in caffeine   Social Determinants of Health   Financial Resource Strain: Not on file  Food Insecurity: Not on file  Transportation Needs: Not on file  Physical Activity: Not on file  Stress: Not on file  Social Connections: Not on file  Intimate Partner Violence: Not on file    Medications:   Current Outpatient Medications on File Prior to Visit  Medication Sig Dispense Refill   amLODipine (NORVASC) 5 MG tablet TAKE 1 TABLET (5 MG TOTAL) BY MOUTH DAILY. 30 tablet 0   aspirin 325 MG EC tablet Take 1 tablet (325 mg total) by mouth daily. 100 tablet 0   aspirin EC 325 MG tablet TAKE 1 TABLET (325 MG TOTAL) BY MOUTH DAILY. 100 tablet 0   atorvastatin (LIPITOR) 80 MG tablet TAKE 1 TABLET (80 MG TOTAL) BY MOUTH DAILY. 30 tablet 0   dicyclomine (BENTYL) 10 MG capsule Take 1 capsule (10 mg total) by mouth 4 (four) times daily -  before meals and at bedtime. Stop if constipated 120 capsule 3   escitalopram (LEXAPRO) 20 MG tablet TAKE 1 TABLET (20 MG TOTAL) BY MOUTH DAILY. 20 tablet 0   fluticasone (FLONASE) 50 MCG/ACT nasal spray Use 2 sprays in each nostril BID for a week. After 1 week, decrease to 1 spray in each nostril BID as needed for congestion/allergies. 16 g 6   gabapentin (NEURONTIN) 300 MG capsule TAKE 1 CAPSULE BY MOUTH TWICE DAILY AND 2 CAPSULES EVERY NIGHT AT BEDTIME 120 capsule 1   ibuprofen (ADVIL) 600 MG tablet Take 1 tablet (600 mg total) by mouth every 6 (six) hours as needed. 30 tablet 0   methocarbamol  (ROBAXIN) 500 MG tablet Take 1 tablet (500 mg total) by mouth every 8 (eight) hours as needed for muscle spasms. 90 tablet 1   pantoprazole (PROTONIX) 40 MG tablet Take 1 tablet (40 mg total) by mouth daily. 30 tablet 1   PARAGARD INTRAUTERINE COPPER IU by Intrauterine route.     topiramate (TOPAMAX) 50 MG tablet Take 1 tablet (50 mg total) by mouth 2 (two) times daily. Start 1 tablet daily x 1 week and then twice daily 60 tablet 3   traZODone (DESYREL) 100 MG tablet Take 100 mg by mouth at bedtime.     No current facility-administered medications on file prior to visit.    Allergies:  No Known Allergies  Physical Exam General: well developed, well nourished, seated, in no evident distress Head: head normocephalic and atraumatic.   Neck: supple with no carotid or supraclavicular bruits Cardiovascular: regular rate and rhythm,  no murmurs Musculoskeletal: no deformity.  Favors her back due to pain and spasm Skin:  no rash/petichiae Vascular:  Normal pulses all extremities  Neurologic Exam Mental Status: Awake and fully alert. Oriented to place and time. Recent and remote memory intact. Attention span, concentration and fund of knowledge appropriate. Mood and affect appropriate.  Cranial Nerves: Fundoscopic exam reveals sharp disc margins. Pupils equal, briskly reactive to light. Extraocular movements full without nystagmus. Visual fields full to confrontation. Hearing intact. Facial sensation intact. Face, tongue, palate moves normally and symmetrically.  Motor: Normal bulk and tone. Normal strength in all tested extremity muscles.  Diminished fine finger movements on the left.  Orbits right over left upper extremity. Sensory.:  Subjective diminished left lower face and upper and lower extremity pinprick , position and vibratory sensation.  Coordination: Rapid alternating movements normal in all extremities. Finger-to-nose and heel-to-shin performed accurately bilaterally. Gait and Station:  Arises from chair with  difficulty. Stance is antalgic and favoring her back.  Drags left leg due to pain gait demonstrates normal stride length and balance .  Reflexes: 1+ and symmetric. Toes downgoing.      ASSESSMENT: 46 year old African-American lady with right thalamic lacunar infarct in October 2021 secondary to small vessel disease with residual postop paresthesias.  Vascular risk factors of hypertension, hyperlipidemia, and obesity.  Sleep study 09/2020 rule out sleep apnea.  He also has longstanding history of migraines which appears suboptimally controlled on the present low-dose of Topamax.     PLAN:  1.  Right thalamic stroke -Residual paresthesias -continue topiramate 50 mg twice daily -Continue atorvastatin and aspirin for secondary stroke prevention -Routine follow-up with PCP for aggressive stroke risk factor management with maintaining hypertension with blood pressure goal below 130/90, and lipids with LDL cholesterol goal below 70 mg/dL.       CC:  [facility] provider: Noreene Larsson, NP   I spent *** minutes of face-to-face and non-face-to-face time with patient.  This included previsit chart review, lab review, study review, order entry, electronic health record documentation, patient education   Frann Rider, [facility]  [name] [address] [place], [place] [address]  Phone [phone] Fax [phone] Note: This document was prepared with digital dictation and possible smart phrase technology. Any transcriptional errors that result from this process are unintentional.

## 2020-11-17 ENCOUNTER — Other Ambulatory Visit: Payer: Self-pay | Admitting: Physical Medicine & Rehabilitation

## 2020-11-17 DIAGNOSIS — F331 Major depressive disorder, recurrent, moderate: Secondary | ICD-10-CM | POA: Diagnosis not present

## 2020-11-17 DIAGNOSIS — F411 Generalized anxiety disorder: Secondary | ICD-10-CM | POA: Diagnosis not present

## 2020-11-17 DIAGNOSIS — Z79899 Other long term (current) drug therapy: Secondary | ICD-10-CM | POA: Diagnosis not present

## 2020-11-17 DIAGNOSIS — F410 Panic disorder [episodic paroxysmal anxiety] without agoraphobia: Secondary | ICD-10-CM | POA: Diagnosis not present

## 2020-11-17 DIAGNOSIS — M792 Neuralgia and neuritis, unspecified: Secondary | ICD-10-CM

## 2020-11-18 ENCOUNTER — Other Ambulatory Visit: Payer: Self-pay

## 2020-11-18 ENCOUNTER — Ambulatory Visit (HOSPITAL_COMMUNITY): Payer: Medicaid Other | Admitting: Physical Therapy

## 2020-11-18 DIAGNOSIS — R2689 Other abnormalities of gait and mobility: Secondary | ICD-10-CM

## 2020-11-18 DIAGNOSIS — M6281 Muscle weakness (generalized): Secondary | ICD-10-CM

## 2020-11-18 DIAGNOSIS — M545 Low back pain, unspecified: Secondary | ICD-10-CM | POA: Diagnosis not present

## 2020-11-18 NOTE — Therapy (Signed)
[facility] [place], [place], [address] Phone: [phone]   Fax:  [phone]  Physical Therapy Treatment  Patient Details  Name: Joyce [name] MRN: [id number] Date of Birth: 11/30/1975 Referring Provider (PT): Alger Simons MD   Encounter Date: 11/18/2020   PT End of Session - 11/18/20 1718     Visit Number 2    Number of Visits 8    Date for PT Re-Evaluation 12/09/20    Authorization Type Medicaid [employer] (6 therapy visits shown as used from previous episode)  12 initally then need further approval    Authorization - Visit Number 1    Progress Note Due on Visit 10    PT Start Time 1622    PT Stop Time 1710    PT Time Calculation (min) 48 min    Activity Tolerance Patient tolerated treatment well    Behavior During Therapy [facility] for tasks assessed/performed             Past Medical History:  Diagnosis Date   Acute CVA (cerebrovascular accident) ([place]) 03/04/2020   Acute CVA (cerebrovascular accident) ([place]) 03/04/2020   Acute left-sided weakness 03/05/2020   AMA (advanced maternal age) multigravida 35+ 11/12/2014   Anemia    Depression    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   Hypertension    Migraine    Pregnant 11/12/2014   PTSD (post-traumatic stress disorder) 05/01/2017   Round ligament pain 11/12/2014   Short of breath on exertion 11/12/2014   Trichomonas infection     Past Surgical History:  Procedure Laterality Date   CHOLECYSTECTOMY      There were no vitals filed for this visit.   Subjective Assessment - 11/18/20 1634     Subjective pt states she had an episode of severe pain last night and it was "crucial" pain.  States 10/10 and had to take 4 ibuprofen and 2 mm relaxers.  States she doesn't know what caused it but she had to run kids to sports practice, sat in car and waited and by the time she got home and in the bed her pain increased.  STates her feet have also been swelling.  Today she has only  a little pain at 5/10 especially with deep breath and no swelling in her LE's.    Currently in Pain? Yes    Pain Score 5     Pain Location Back    Pain Orientation Mid;Lower    Pain Descriptors / Indicators Aching;Sore                               [facility] Adult PT Treatment/Exercise - 11/18/20 0001       Lumbar Exercises: Supine   Ab Set 10 reps    AB Set Limitations 5 second holds    Bridge 10 reps    Straight Leg Raise 10 reps    Straight Leg Raises Limitations with abdominal bracing      Lumbar Exercises: Sidelying   Clam Both;10 reps;5 seconds    Hip Abduction Both;10 reps      Lumbar Exercises: Prone   Straight Leg Raise 10 reps    Other Prone Lumbar Exercises hamstring curls 10X    Other Prone Lumbar Exercises heelsqueeaze 10X5"                      PT Short Term Goals - 11/18/20 1717  PT SHORT TERM GOAL #1   Title Patient will be independent with initial HEP and self-management strategies to improve functional outcomes    Time 2    Period Weeks    Status On-going    Target Date 11/25/20               PT Long Term Goals - 11/18/20 1717       PT LONG TERM GOAL #1   Title Patient will have equal to or > 4/5 MMT throughout BLE to improve ability to perform functional mobility, stair ambulation and ADLs.    Time 4    Period Weeks    Status On-going      PT LONG TERM GOAL #2   Title Patient will be able to ambulate at least 250 feet during 2MWT with LRAD to demonstrate improved ability to perform functional mobility and associated tasks.    Time 4    Period Weeks    Status On-going      PT LONG TERM GOAL #3   Title Patient will report at least 70% overall improvement in subjective complaint to indicate improvement in ability to perform ADLs.    Time 4    Period Weeks    Status On-going      PT LONG TERM GOAL #4   Title Patient will be able to stand > 60 minutes with pain not to exceed 3/10 in lumbar to improve  ability to perform cooking and grooming ADLs.    Time 4    Period Weeks    Status On-going                   Plan - 11/18/20 1710     Clinical Impression Statement Pt presents with All wheel walker this visit and noted antalgia.Educated this session on core stability and impact on lumbar health.  Instructed with abdominal bracing completing remaining exercises incorporating this.  Pt observed with difficulty transferring sit to supine laying straight back but completed supine to sit using the logroll technique without cues to do so.  LE strengthening initiated with noted challenge completing bilaterally with decreased ROM and shaking of limb more so on Lt than Rt.  Pt tends to extend bil knees flat between each activity as states this is more comfortable for her back than having them bent.  Pt unable to clear bottom while completing bridge but observed ability to scoot over into car seat without difficulty.  Pt with overall slow mobility and mechanics with all functional activities.  Pt with reported increase in pain following exercises but unsure if this is pain or soreness when questioned.    Personal Factors and Comorbidities Comorbidity 1;Comorbidity 2    Comorbidities migraines, HTN    Examination-Activity Limitations Locomotion Level;Lift;Transfers;Toileting;Stand;Stairs;Squat;Carry;Bathing    Examination-Participation Restrictions Cleaning;Community Activity;Meal Prep;Shop    Stability/Clinical Decision Making Stable/Uncomplicated    Rehab Potential Good    PT Frequency 2x / week    PT Duration 4 weeks    PT Treatment/Interventions ADLs/Self Care Home Management;Aquatic Therapy;Cryotherapy;Electrical Stimulation;Moist Heat;Traction;Balance training;Therapeutic exercise;Therapeutic activities;Functional mobility training;Stair training;Gait training;DME Instruction;Neuromuscular re-education;Patient/family education;Orthotic Fit/Training;Manual techniques;Energy conservation;Dry  needling;Passive range of motion;Biofeedback;Spinal Manipulations;Joint Manipulations;Splinting;Taping;Vasopneumatic Device;Scar mobilization;Ultrasound;Parrafin;Compression bandaging    PT Next Visit Plan Progress hip and core strength as tolerated. Manuals as needed.    PT Home Exercise Plan Eval: ab set, hip abduction iso    Consulted and Agree with Plan of Care Patient  Patient will benefit from skilled therapeutic intervention in order to improve the following deficits and impairments:  Improper body mechanics, Decreased range of motion, Decreased balance, Decreased mobility, Difficulty walking, Pain, Decreased strength, Decreased activity tolerance, Abnormal gait, Decreased endurance, Increased fascial restricitons, Impaired flexibility  Visit Diagnosis: Low back pain, unspecified back pain laterality, unspecified chronicity, unspecified whether sciatica present  Other abnormalities of gait and mobility  Muscle weakness (generalized)     Problem List Patient Active Problem List   Diagnosis Date Noted   Acute otalgia, bilateral 10/06/2020   Diarrhea 08/19/2020   Chronic left-sided low back pain without sciatica 07/01/2020   Abdominal pain 06/19/2020   History of CVA with residual deficit 05/15/2020   Neuropathic pain 05/06/2020   Encounter for IUD insertion 04/20/2020   Anxiety state    Dyslipidemia    Labile blood pressure    Migraine without status migrainosus, not intractable    Right thalamic infarction ([place]) 03/10/2020   Left hemiparesis ([place]) 03/10/2020   Recurrent falls 03/05/2020   MDD (major depressive disorder), recurrent episode, moderate ([place]) 05/01/2017   PTSD (post-traumatic stress disorder) 05/01/2017   Depression, recurrent ([place]) 12/07/2016   Essential hypertension 07/20/2016   Uterine fibroid 01/27/2016   Cervical high risk HPV (human papillomavirus) test positive 12/08/2014   Teena Irani, PTA/CLT [phone]  Teena Irani 11/18/2020, 5:20 PM  [facility] [address] [place], [place], [address] Phone: [phone]   Fax:  [phone]  Name: Joyce [name] MRN: [id number] Date of Birth: 1975/06/26

## 2020-11-19 ENCOUNTER — Encounter (HOSPITAL_COMMUNITY): Payer: Self-pay

## 2020-11-19 ENCOUNTER — Telehealth: Payer: Self-pay

## 2020-11-19 ENCOUNTER — Other Ambulatory Visit: Payer: Self-pay

## 2020-11-19 ENCOUNTER — Ambulatory Visit (HOSPITAL_COMMUNITY): Payer: Medicaid Other

## 2020-11-19 DIAGNOSIS — H9203 Otalgia, bilateral: Secondary | ICD-10-CM

## 2020-11-19 DIAGNOSIS — M545 Low back pain, unspecified: Secondary | ICD-10-CM

## 2020-11-19 DIAGNOSIS — R2689 Other abnormalities of gait and mobility: Secondary | ICD-10-CM

## 2020-11-19 NOTE — Therapy (Addendum)
[facility] [address] [place], [place], [address] Phone: [phone]   Fax:  [phone]  Physical Therapy Treatment  Patient Details  Name: Joyce [name] MRN: [id number] Date of Birth: February 28, 1976 Referring Provider (PT): Alger Simons MD   PHYSICAL THERAPY DISCHARGE SUMMARY  Visits from Start of Care: 3  Current functional level related to goals / functional outcomes: See below    Remaining deficits: See below    Education / Equipment: See assessment   Patient agrees to discharge. Patient goals were partially met. Patient is being discharged due to financial reasons.   Encounter Date: 11/19/2020   PT End of Session - 11/19/20 1141     Visit Number 3    Number of Visits 8    Date for PT Re-Evaluation 12/09/20    Authorization Type Medicaid [employer] (6 therapy visits shown as used from previous episode)  12 initally then need further approval    Authorization Time Period not approved due to already had 43 visits this year    PT Start Time 20    PT Stop Time 1213    PT Time Calculation (min) 38 min    Activity Tolerance Patient tolerated treatment well    Behavior During Therapy WFL for tasks assessed/performed             Past Medical History:  Diagnosis Date   Acute CVA (cerebrovascular accident) ([place]) 03/04/2020   Acute CVA (cerebrovascular accident) ([place]) 03/04/2020   Acute left-sided weakness 03/05/2020   AMA (advanced maternal age) multigravida 35+ 11/12/2014   Anemia    Depression    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   Hypertension    Migraine    Pregnant 11/12/2014   PTSD (post-traumatic stress disorder) 05/01/2017   Round ligament pain 11/12/2014   Short of breath on exertion 11/12/2014   Trichomonas infection     Past Surgical History:  Procedure Laterality Date   CHOLECYSTECTOMY      There were no vitals filed for this visit.   Subjective Assessment - 11/19/20 1140     Subjective Pt stated  she is feeling good.  Reports she has increased pain with walking after an hour.    Pertinent History HTN, Migraine, PTSD, Acute CVA 03/04/20    Limitations Standing;Walking;House hold activities    How long can you stand comfortably? able to stand for an hour without pain    How long can you walk comfortably? able to walk for an hour comfortably.    Patient Stated Goals Be able to exercise when having pain, know what to do for pain    Currently in Pain? No/denies                [facility] PT Assessment - 11/19/20 0001       Assessment   Medical Diagnosis LBP    Referring Provider (PT) Alger Simons MD    Onset Date/Surgical Date 03/07/20    Hand Dominance Right    Next MD Visit July 2022    Prior Therapy Yes      Precautions   Precautions Fall      Strength   Right/Left Hip Right;Left    Right Hip Flexion 4/5   was 4/5   Right Hip Extension 3-/5   was 3-   Right Hip ABduction 4/5   was 4/5   Left Hip Flexion 3/5   decreased range; was 4-5   Left Hip Extension 3-/5   was 3-  Left Hip ABduction 3/5   decrease ROM; was 3+   Right Knee Flexion 4/5    Right Knee Extension --   was 4+   Left Knee Flexion 3-/5   tremor   Left Knee Extension --   was 4-     Transfers   Transfers Sit to Stand    Comments 5STS 35.11"      Ambulation/Gait   Ambulation/Gait Yes    Ambulation/Gait Assistance 5: Supervision    Ambulation Distance (Feet) 182 Feet    Assistive device Rollator    Gait Pattern Decreased dorsiflexion - left;Poor foot clearance - left;Trunk flexed;Decreased stride length;Decreased step length - right;Decreased step length - left    Ambulation Surface Level;Indoor    Gait velocity decreased    Gait Comments 2MWT                           [facility] Adult PT Treatment/Exercise - 11/19/20 0001       Lumbar Exercises: Supine   Bridge 10 reps    Straight Leg Raise 10 reps    Straight Leg Raises Limitations with abdominal bracing      Lumbar Exercises:  Sidelying   Hip Abduction Both;10 reps                      PT Short Term Goals - 11/19/20 1142       PT SHORT TERM GOAL #1   Title Patient will be independent with initial HEP and self-management strategies to improve functional outcomes    Baseline 11/19/20:  Reports compliance with HEP daily.  Has changed bed mobilty techniques to assist with pain contol.    Status Achieved               PT Long Term Goals - 11/19/20 1151       PT LONG TERM GOAL #1   Title Patient will have equal to or > 4/5 MMT throughout BLE to improve ability to perform functional mobility, stair ambulation and ADLs.    Baseline 11/19/20: see MMT, continues to demonstrate weakness    Status Not Met      PT LONG TERM GOAL #2   Title Patient will be able to ambulate at least 250 feet during 2MWT with LRAD to demonstrate improved ability to perform functional mobility and associated tasks.    Baseline 11/19/20: 2MWT 173f with rollator    Status On-going      PT LONG TERM GOAL #3   Title Patient will report at least 70% overall improvement in subjective complaint to indicate improvement in ability to perform ADLs.    Baseline 11/19/20:  Reports improvements by 50% with improved strageties to improve strength and body mechanics    Status On-going      PT LONG TERM GOAL #4   Title Patient will be able to stand > 60 minutes with pain not to exceed 3/10 in lumbar to improve ability to perform cooking and grooming ADLs.    Baseline 11/19/20:  Reports ability to stand for an hour wiht increased LBP to 10/10    Status Not Met                   Plan - 11/19/20 1217     Clinical Impression Statement [employer] denied authorization due to already received total authorized visits this year.  Pt doesn't wish to continue therapy with self pay.  Reviewed goals with  1 STG met, reports compliance wth HEP and self managements strategies.  Pt continues to exhibit significant weakness LE.  MMT, 2MWT,  and reviewed subjective improvements.  Pt continues to have increased pain standing and walking for longer than 60 minutes.  Advanced HEP with additional hip strengthening exercises, printout given and verbalized understanding with importance of continueing HEP.    Personal Factors and Comorbidities Comorbidity 1;Comorbidity 2    Comorbidities migraines, HTN    Examination-Activity Limitations Locomotion Level;Lift;Transfers;Toileting;Stand;Stairs;Squat;Carry;Bathing    Examination-Participation Restrictions Cleaning;Community Activity;Meal Prep;Shop    Stability/Clinical Decision Making Stable/Uncomplicated    Clinical Decision Making Low    Rehab Potential Good    PT Frequency 2x / week    PT Duration 4 weeks    PT Treatment/Interventions ADLs/Self Care Home Management;Aquatic Therapy;Cryotherapy;Electrical Stimulation;Moist Heat;Traction;Balance training;Therapeutic exercise;Therapeutic activities;Functional mobility training;Stair training;Gait training;DME Instruction;Neuromuscular re-education;Patient/family education;Orthotic Fit/Training;Manual techniques;Energy conservation;Dry needling;Passive range of motion;Biofeedback;Spinal Manipulations;Joint Manipulations;Splinting;Taping;Vasopneumatic Device;Scar mobilization;Ultrasound;Parrafin;Compression bandaging    PT Next Visit Plan DC to HEP.    PT Home Exercise Plan Eval: ab set, hip abduction iso; 6/23: STS, bridge, SLR, prone SLR and abduction    Consulted and Agree with Plan of Care Patient             Patient will benefit from skilled therapeutic intervention in order to improve the following deficits and impairments:  Improper body mechanics, Decreased range of motion, Decreased balance, Decreased mobility, Difficulty walking, Pain, Decreased strength, Decreased activity tolerance, Abnormal gait, Decreased endurance, Increased fascial restricitons, Impaired flexibility  Visit Diagnosis: Low back pain, unspecified back pain  laterality, unspecified chronicity, unspecified whether sciatica present  Other abnormalities of gait and mobility     Problem List Patient Active Problem List   Diagnosis Date Noted   Acute otalgia, bilateral 10/06/2020   Diarrhea 08/19/2020   Chronic left-sided low back pain without sciatica 07/01/2020   Abdominal pain 06/19/2020   History of CVA with residual deficit 05/15/2020   Neuropathic pain 05/06/2020   Encounter for IUD insertion 04/20/2020   Anxiety state    Dyslipidemia    Labile blood pressure    Migraine without status migrainosus, not intractable    Right thalamic infarction ([place]) 03/10/2020   Left hemiparesis ([place]) 03/10/2020   Recurrent falls 03/05/2020   MDD (major depressive disorder), recurrent episode, moderate ([place]) 05/01/2017   PTSD (post-traumatic stress disorder) 05/01/2017   Depression, recurrent ([place]) 12/07/2016   Essential hypertension 07/20/2016   Uterine fibroid 01/27/2016   Cervical high risk HPV (human papillomavirus) test positive 12/08/2014   Ihor Austin, LPTA/CLT; CBIS [phone]  Aldona Lento 11/19/2020, 1:10 PM  3:37 PM, 11/19/20 Josue Hector PT DPT  Physical Therapist with [facility]  [phone] [place] [address] [place], [place], [address] Phone: [phone]   Fax:  [phone]  Name: MEREDITH KILBRIDE MRN: [id number] Date of Birth: 10/06/1975

## 2020-11-19 NOTE — Telephone Encounter (Signed)
Patient called would like the referral for ENT sent to Dr Kasandra Knudsen [facility], they accept her insurance.

## 2020-11-19 NOTE — Telephone Encounter (Signed)
Referral sent 

## 2020-11-19 NOTE — Patient Instructions (Addendum)
Functional Quadriceps: Sit to Stand    Sit on edge of chair, feet flat on floor. Stand upright, extending knees fully. Repeat 10 times per set. Do 3 sets per session.   Copyright  VHI. All rights reserved.   Bridge    Lie back, legs bent. Inhale, pressing hips up. Keeping ribs in, lengthen lower back. Exhale, rolling down along spine from top. Repeat ____ times. Do ____ sessions per day.  http://pm.exer.us/55   Copyright  VHI. All rights reserved.   Straight Leg Raise    Tighten stomach and slowly raise locked right leg ____ inches from floor. Repeat 10 times per set. Do ____ sets per session. Do ____ sessions per day.  http://orth.exer.us/1103   Copyright  VHI. All rights reserved.    Abduction: Side Leg Lift (Eccentric) - Side-Lying    Lie on side. Lift top leg slightly higher than shoulder level. Keep top leg straight with body, toes pointing forward. Slowly lower for 3-5 seconds.  10 reps per set, 2 sets per day, 7 days per week.   http://ecce.exer.us/63   Copyright  VHI. All rights reserved.    Straight Leg Raise (Prone)    Abdomen and head supported, keep left knee locked and raise leg at hip. Avoid arching low back. Repeat 10 times per set. Do 2 sets per day.  http://orth.exer.us/1113   Copyright  VHI. All rights reserved.

## 2020-11-20 ENCOUNTER — Other Ambulatory Visit: Payer: Self-pay

## 2020-11-20 DIAGNOSIS — E785 Hyperlipidemia, unspecified: Secondary | ICD-10-CM | POA: Diagnosis not present

## 2020-11-20 DIAGNOSIS — I10 Essential (primary) hypertension: Secondary | ICD-10-CM | POA: Diagnosis not present

## 2020-11-20 MED ORDER — ESCITALOPRAM OXALATE 20 MG PO TABS: ORAL_TABLET | Freq: Every day | ORAL | 0 refills | Status: DC

## 2020-11-20 MED ORDER — ATORVASTATIN CALCIUM 80 MG PO TABS: ORAL_TABLET | Freq: Every day | ORAL | 0 refills | Status: DC

## 2020-11-20 MED ORDER — AMLODIPINE BESYLATE 5 MG PO TABS: ORAL_TABLET | Freq: Every day | ORAL | 0 refills | Status: DC

## 2020-11-21 LAB — CMP14+EGFR
ALT: 13 IU/L (ref 0–32)
AST: 13 IU/L (ref 0–40)
Albumin/Globulin Ratio: 1.3 (ref 1.2–2.2)
Albumin: 4 g/dL (ref 3.8–4.8)
Alkaline Phosphatase: 81 IU/L (ref 44–121)
BUN/Creatinine Ratio: 10 (ref 9–23)
BUN: 7 mg/dL (ref 6–24)
Bilirubin Total: 0.4 mg/dL (ref 0.0–1.2)
CO2: 22 mmol/L (ref 20–29)
Calcium: 9.1 mg/dL (ref 8.7–10.2)
Chloride: 107 mmol/L — ABNORMAL HIGH (ref 96–106)
Creatinine, Ser: 0.68 mg/dL (ref 0.57–1.00)
Globulin, Total: 3 g/dL (ref 1.5–4.5)
Glucose: 89 mg/dL (ref 65–99)
Potassium: 4.2 mmol/L (ref 3.5–5.2)
Sodium: 140 mmol/L (ref 134–144)
Total Protein: 7 g/dL (ref 6.0–8.5)
eGFR: 111 mL/min/{1.73_m2} (ref >59)

## 2020-11-21 LAB — CBC WITH DIFFERENTIAL/PLATELET
Basophils Absolute: 0.1 10*3/uL (ref 0.0–0.2)
Basos: 1 %
EOS (ABSOLUTE): 0.1 10*3/uL (ref 0.0–0.4)
Eos: 1 %
Hematocrit: 39.9 % (ref 34.0–46.6)
Hemoglobin: 13.2 g/dL (ref 11.1–15.9)
Immature Grans (Abs): 0 10*3/uL (ref 0.0–0.1)
Immature Granulocytes: 0 %
Lymphocytes Absolute: 1.9 10*3/uL (ref 0.7–3.1)
Lymphs: 19 %
MCH: 25.4 pg — ABNORMAL LOW (ref 26.6–33.0)
MCHC: 33.1 g/dL (ref 31.5–35.7)
MCV: 77 fL — ABNORMAL LOW (ref 79–97)
Monocytes Absolute: 0.6 10*3/uL (ref 0.1–0.9)
Monocytes: 6 %
Neutrophils Absolute: 7.6 10*3/uL — ABNORMAL HIGH (ref 1.4–7.0)
Neutrophils: 73 %
Platelets: 313 10*3/uL (ref 150–450)
RBC: 5.2 x10E6/uL (ref 3.77–5.28)
RDW: 13.9 % (ref 11.7–15.4)
WBC: 10.3 10*3/uL (ref 3.4–10.8)

## 2020-11-21 LAB — LIPID PANEL WITH LDL/HDL RATIO
Cholesterol, Total: 125 mg/dL (ref 100–199)
HDL: 43 mg/dL (ref >39)
LDL Chol Calc (NIH): 70 mg/dL (ref 0–99)
LDL/HDL Ratio: 1.6 ratio (ref 0.0–3.2)
Triglycerides: 56 mg/dL (ref 0–149)
VLDL Cholesterol Cal: 12 mg/dL (ref 5–40)

## 2020-11-22 NOTE — Progress Notes (Signed)
Labs look ok.

## 2020-11-23 ENCOUNTER — Encounter: Payer: Self-pay | Admitting: Nurse Practitioner

## 2020-11-23 ENCOUNTER — Other Ambulatory Visit: Payer: Self-pay

## 2020-11-23 ENCOUNTER — Ambulatory Visit: Payer: Medicaid Other | Admitting: Nurse Practitioner

## 2020-11-23 VITALS — BP 115/80 | HR 80 | Temp 98.5°F | Ht 65.0 in | Wt 221.0 lb

## 2020-11-23 DIAGNOSIS — E785 Hyperlipidemia, unspecified: Secondary | ICD-10-CM | POA: Diagnosis not present

## 2020-11-23 DIAGNOSIS — I10 Essential (primary) hypertension: Secondary | ICD-10-CM | POA: Diagnosis not present

## 2020-11-23 DIAGNOSIS — H9203 Otalgia, bilateral: Secondary | ICD-10-CM

## 2020-11-23 DIAGNOSIS — R197 Diarrhea, unspecified: Secondary | ICD-10-CM

## 2020-11-23 DIAGNOSIS — F331 Major depressive disorder, recurrent, moderate: Secondary | ICD-10-CM | POA: Diagnosis not present

## 2020-11-23 NOTE — Progress Notes (Signed)
Established Patient Office Visit  Subjective:  Patient ID: Joyce [name], female    DOB: [birth year]  Age: 45 y.o. MRN: [id number]  CC:  Chief Complaint  Patient presents with   Depression    Follow up    HPI Joyce [name] presents for lab follow-up.  She states her depression is stable and doing well.  She is still having right ear pain and trouble hearing. She was referred to ENT, but Dr. Lucia Gaskins doesn't take Medicaid, so she ahsn't had an appt. yet.   Past Medical History:  Diagnosis Date   Acute CVA (cerebrovascular accident) ([place]) 03/04/2020   Acute CVA (cerebrovascular accident) ([place]) 03/04/2020   Acute left-sided weakness 03/05/2020   AMA (advanced maternal age) multigravida 35+ 11/12/2014   Anemia    Depression    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   Hypertension    Migraine    Pregnant 11/12/2014   PTSD (post-traumatic stress disorder) 05/01/2017   Round ligament pain 11/12/2014   Short of breath on exertion 11/12/2014   Trichomonas infection     Past Surgical History:  Procedure Laterality Date   CHOLECYSTECTOMY      Family History  Problem Relation Age of Onset   Diabetes Mother    Hypertension Mother    Kidney disease Mother    Asthma Mother    Hyperlipidemia Mother    Alzheimer's disease Father    Diabetes Father    Kidney disease Sister    Asthma Son    Diabetes Sister    Asthma Son    ADD / ADHD Son    Heart murmur Son    Asthma Son    Colon cancer Neg Hx    Colon polyps Neg Hx     Social History   Socioeconomic History   Marital status: Significant Other    Spouse name: Engineer, materials   Number of children: 8   Years of education: 9   Highest education level: Not on file  Occupational History   Occupation: unemployed    Comment: disabled  Tobacco Use   Smoking status: Never   Smokeless tobacco: Never  Vaping Use   Vaping Use: Never used  Substance and Sexual Activity   Alcohol use: Not Currently    Comment: occasionally   Drug use:  No   Sexual activity: Yes    Birth control/protection: I.U.D.    Comment: followed by GYN  Other Topics Concern   Not on file  Social History Narrative   Lives with fiance and kids   Right Handed   Drinks >12 cans of soda in caffeine   Social Determinants of Health   Financial Resource Strain: Not on file  Food Insecurity: Not on file  Transportation Needs: Not on file  Physical Activity: Not on file  Stress: Not on file  Social Connections: Not on file  Intimate Partner Violence: Not on file    Outpatient Medications Prior to Visit  Medication Sig Dispense Refill   amLODipine (NORVASC) 5 MG tablet TAKE 1 TABLET (5 MG TOTAL) BY MOUTH DAILY. 30 tablet 0   aspirin 325 MG EC tablet Take 1 tablet (325 mg total) by mouth daily. 100 tablet 0   aspirin EC 325 MG tablet TAKE 1 TABLET (325 MG TOTAL) BY MOUTH DAILY. 100 tablet 0   atorvastatin (LIPITOR) 80 MG tablet TAKE 1 TABLET (80 MG TOTAL) BY MOUTH DAILY. 30 tablet 0   dicyclomine (BENTYL) 10 MG capsule Take 1 capsule (10 mg total) by mouth  4 (four) times daily -  before meals and at bedtime. Stop if constipated 120 capsule 3   escitalopram (LEXAPRO) 20 MG tablet TAKE 1 TABLET (20 MG TOTAL) BY MOUTH DAILY. 20 tablet 0   fluticasone (FLONASE) 50 MCG/ACT nasal spray Use 2 sprays in each nostril BID for a week. After 1 week, decrease to 1 spray in each nostril BID as needed for congestion/allergies. 16 g 6   gabapentin (NEURONTIN) 300 MG capsule Take 1 capsule by mouth bid and 2 capsules at hs. 120 capsule 1   ibuprofen (ADVIL) 600 MG tablet Take 1 tablet (600 mg total) by mouth every 6 (six) hours as needed. 30 tablet 0   methocarbamol (ROBAXIN) 500 MG tablet Take 1 tablet (500 mg total) by mouth every 8 (eight) hours as needed for muscle spasms. 90 tablet 1   pantoprazole (PROTONIX) 40 MG tablet Take 1 tablet (40 mg total) by mouth daily. 30 tablet 1   PARAGARD INTRAUTERINE COPPER IU by Intrauterine route.     topiramate (TOPAMAX) 50 MG  tablet Take 1 tablet (50 mg total) by mouth 2 (two) times daily. Start 1 tablet daily x 1 week and then twice daily 60 tablet 3   traZODone (DESYREL) 100 MG tablet Take 100 mg by mouth at bedtime.     No facility-administered medications prior to visit.    No Known Allergies  ROS Review of Systems  Constitutional: Negative.   HENT:  Positive for ear pain and hearing loss.   Respiratory: Negative.    Cardiovascular: Negative.   Psychiatric/Behavioral: Negative.       Objective:    Physical Exam Constitutional:      Appearance: Normal appearance.  HENT:     Right Ear: Ear canal and external ear normal.     Left Ear: Tympanic membrane, ear canal and external ear normal.     Ears:     Comments: Bulging right TM, no erythema Cardiovascular:     Rate and Rhythm: Normal rate and regular rhythm.     Pulses: Normal pulses.     Heart sounds: Normal heart sounds.  Pulmonary:     Effort: Pulmonary effort is normal.     Breath sounds: Normal breath sounds.  Neurological:     Mental Status: She is alert.  Psychiatric:        Mood and Affect: Mood normal.        Behavior: Behavior normal.        Thought Content: Thought content normal.        Judgment: Judgment normal.     Comments: -PHQ-2 = 0    BP 115/80 (BP Location: Left Arm, Patient Position: Sitting, Cuff Size: Large)   Pulse 80   Temp 98.5 F (36.9 C) (Temporal)   Ht '5\' 5"'  (1.651 m)   Wt 221 lb (100.2 kg)   SpO2 98%   BMI 36.78 kg/m  Wt Readings from Last 3 Encounters:  11/23/20 221 lb (100.2 kg)  10/22/20 232 lb 6.4 oz (105.4 kg)  10/21/20 234 lb (106.1 kg)     Health Maintenance Due  Topic Date Due   PAP SMEAR-Modifier  12/01/2020    There are no preventive care reminders to display for this patient.  Lab Results  Component Value Date   TSH 0.523 03/06/2020   Lab Results  Component Value Date   WBC 10.3 11/20/2020   HGB 13.2 11/20/2020   HCT 39.9 11/20/2020   MCV 77 (L) 11/20/2020   PLT  313  11/20/2020   Lab Results  Component Value Date   NA 140 11/20/2020   K 4.2 11/20/2020   CO2 22 11/20/2020   GLUCOSE 89 11/20/2020   BUN 7 11/20/2020   CREATININE 0.68 11/20/2020   BILITOT 0.4 11/20/2020   ALKPHOS 81 11/20/2020   AST 13 11/20/2020   ALT 13 11/20/2020   PROT 7.0 11/20/2020   ALBUMIN 4.0 11/20/2020   CALCIUM 9.1 11/20/2020   ANIONGAP 9 05/20/2020   EGFR 111 11/20/2020   Lab Results  Component Value Date   CHOL 125 11/20/2020   Lab Results  Component Value Date   HDL 43 11/20/2020   Lab Results  Component Value Date   LDLCALC 70 11/20/2020   Lab Results  Component Value Date   TRIG 56 11/20/2020   Lab Results  Component Value Date   CHOLHDL 2.7 08/06/2020   Lab Results  Component Value Date   HGBA1C 5.6 03/05/2020      Assessment & Plan:   Problem List Items Addressed This Visit       Cardiovascular and Mediastinum   Essential hypertension    -BP well controlled  -continue amlodipine       Relevant Orders   CBC with Differential/Platelet   CMP14+EGFR   Lipid Panel With LDL/HDL Ratio     Other   MDD (major depressive disorder), recurrent episode, moderate (HCC)    -PHQ-2 = 0 -continue lexapro       Dyslipidemia - Primary    Lab Results  Component Value Date   CHOL 125 11/20/2020   HDL 43 11/20/2020   LDLCALC 70 11/20/2020   TRIG 56 11/20/2020   CHOLHDL 2.7 08/06/2020  -continue atorvastatin        Relevant Orders   Lipid Panel With LDL/HDL Ratio   Diarrhea    -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 Benjamine Mola       Relevant Orders   Ambulatory referral to ENT    No orders of the defined types were placed in this encounter.   Follow-up: Return in about 6 months (around 05/25/2021) for Lab follow-up (HLD, HTN).    Noreene Larsson, NP

## 2020-11-23 NOTE — Assessment & Plan Note (Signed)
Lab Results  Component Value Date   CHOL 125 11/20/2020   HDL 43 11/20/2020   LDLCALC 70 11/20/2020   TRIG 56 11/20/2020   CHOLHDL 2.7 08/06/2020  -continue atorvastatin

## 2020-11-23 NOTE — Assessment & Plan Note (Signed)
-  was diagnosed with Giardia and treated by GI

## 2020-11-23 NOTE — Assessment & Plan Note (Signed)
-  BP well controlled  -continue amlodipine

## 2020-11-23 NOTE — Assessment & Plan Note (Signed)
-  PHQ-2 = 0 -continue lexapro

## 2020-11-23 NOTE — Assessment & Plan Note (Signed)
-  today right ear has pain and bulging TM -she has been using flonase with no relief -referral to Dr. Benjamine Mola

## 2020-11-23 NOTE — Patient Instructions (Signed)
Please have fasting labs drawn 2-3 days prior to your appointment so we can discuss the results during your office visit.  

## 2020-12-02 ENCOUNTER — Encounter: Payer: Medicaid Other | Attending: Physical Medicine & Rehabilitation | Admitting: Physical Medicine & Rehabilitation

## 2020-12-02 ENCOUNTER — Encounter (HOSPITAL_COMMUNITY): Payer: Medicaid Other | Admitting: Physical Therapy

## 2020-12-02 ENCOUNTER — Other Ambulatory Visit: Payer: Self-pay

## 2020-12-02 ENCOUNTER — Encounter: Payer: Self-pay | Admitting: Physical Medicine & Rehabilitation

## 2020-12-02 ENCOUNTER — Encounter (HOSPITAL_COMMUNITY): Payer: Self-pay

## 2020-12-02 VITALS — BP 120/76 | HR 99 | Temp 98.0°F | Ht 65.0 in | Wt 240.0 lb

## 2020-12-02 DIAGNOSIS — F900 Attention-deficit hyperactivity disorder, predominantly inattentive type: Secondary | ICD-10-CM | POA: Diagnosis not present

## 2020-12-02 DIAGNOSIS — K219 Gastro-esophageal reflux disease without esophagitis: Secondary | ICD-10-CM | POA: Diagnosis not present

## 2020-12-02 DIAGNOSIS — M792 Neuralgia and neuritis, unspecified: Secondary | ICD-10-CM | POA: Insufficient documentation

## 2020-12-02 DIAGNOSIS — I639 Cerebral infarction, unspecified: Secondary | ICD-10-CM | POA: Diagnosis not present

## 2020-12-02 DIAGNOSIS — I6381 Other cerebral infarction due to occlusion or stenosis of small artery: Secondary | ICD-10-CM

## 2020-12-02 DIAGNOSIS — G8194 Hemiplegia, unspecified affecting left nondominant side: Secondary | ICD-10-CM | POA: Diagnosis not present

## 2020-12-02 MED ORDER — PANTOPRAZOLE SODIUM 40 MG PO TBEC: 40.0000 mg | DELAYED_RELEASE_TABLET | Freq: Every day | ORAL | 5 refills | Status: DC

## 2020-12-02 MED ORDER — GABAPENTIN 600 MG PO TABS: 600.0000 mg | ORAL_TABLET | Freq: Three times a day (TID) | ORAL | 5 refills | Status: DC

## 2020-12-02 NOTE — Progress Notes (Signed)
Subjective:    Patient ID: Joyce [name], female    DOB: 1976/01/23, 45 y.o.   MRN: [id number]  HPI  Joyce [name] is here in follow-up of her right thalamic infarction.  I last saw her in April.  At that visit we made some adjustments in her gabapentin to help with her neuropathic pain/headaches. This change helped a lot with her headaches. The only times she has them now is when she menstruates.   She complains of ongoing numbness in the left leg. She may have pain as well especially if she's on the leg for longer periods of time.  She is also having low back pain when she first gets out of bed as when she sits a lot or when she is up for longer periods of time.   Lumbar Xray 2.22 There are five non-rib bearing lumbar-type vertebral bodies. There is normal alignment. There is no evidence for acute fracture or subluxation. Intervertebral disc spaces are preserved without significant degenerative changes. Mild lower lumbar facet arthropathy.   She is doing some stretching recommended by therapy, in supine. Her therapy rx expired.  Pain Inventory Average Pain 8 Pain Right Now 7 My pain is sharp and aching  In the last 24 hours, has pain interfered with the following? General activity 8 Relation with others 0 Enjoyment of life 9 What TIME of day is your pain at its worst? daytime and night Sleep (in general) Poor  Pain is worse with: walking, sitting, and standing Pain improves with:  none Relief from Meds: 1  Family History  Problem Relation Age of Onset   Diabetes Mother    Hypertension Mother    Kidney disease Mother    Asthma Mother    Hyperlipidemia Mother    Alzheimer's disease Father    Diabetes Father    Kidney disease Sister    Asthma Son    Diabetes Sister    Asthma Son    ADD / ADHD Son    Heart murmur Son    Asthma Son    Colon cancer Neg Hx    Colon polyps Neg Hx    Social History   Socioeconomic History   Marital status: Significant Other     Spouse name: Engineer, materials   Number of children: 8   Years of education: 9   Highest education level: Not on file  Occupational History   Occupation: unemployed    Comment: disabled  Tobacco Use   Smoking status: Never   Smokeless tobacco: Never  Vaping Use   Vaping Use: Never used  Substance and Sexual Activity   Alcohol use: Not Currently    Comment: occasionally   Drug use: No   Sexual activity: Yes    Birth control/protection: I.U.D.    Comment: followed by GYN  Other Topics Concern   Not on file  Social History Narrative   Lives with fiance and kids   Right Handed   Drinks >12 cans of soda in caffeine   Social Determinants of Health   Financial Resource Strain: Not on file  Food Insecurity: Not on file  Transportation Needs: Not on file  Physical Activity: Not on file  Stress: Not on file  Social Connections: Not on file   Past Surgical History:  Procedure Laterality Date   CHOLECYSTECTOMY     Past Surgical History:  Procedure Laterality Date   CHOLECYSTECTOMY     Past Medical History:  Diagnosis Date   Acute CVA (cerebrovascular accident) ([place])  03/04/2020   Acute CVA (cerebrovascular accident) ([place]) 03/04/2020   Acute left-sided weakness 03/05/2020   AMA (advanced maternal age) multigravida 35+ 11/12/2014   Anemia    Depression    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   Hypertension    Migraine    Pregnant 11/12/2014   PTSD (post-traumatic stress disorder) 05/01/2017   Round ligament pain 11/12/2014   Short of breath on exertion 11/12/2014   Trichomonas infection    BP 120/76   Pulse 99   Temp 98 F (36.7 C)   Ht 5\' 5"  (1.651 m)   Wt 240 lb (108.9 kg)   SpO2 98%   BMI 39.94 kg/m   Opioid Risk Score:   Fall Risk Score:  `1  Depression screen PHQ 2/9  Depression screen [facility] 2/9 11/23/2020 10/21/2020 10/06/2020 06/19/2020 05/15/2020 05/06/2020 04/03/2020  Decreased Interest 0 0 0 1 1 1 2   Down, Depressed, Hopeless 0 0 1 1 1 1 3   PHQ - 2 Score 0 0 1 2 2 2 5    Altered sleeping - - - 1 1 - 3  Tired, decreased energy - - - 2 1 - 2  Change in appetite - - - 1 1 - 2  Feeling bad or failure about yourself  - - - 1 1 - 3  Trouble concentrating - - - 1 1 - 3  Moving slowly or fidgety/restless - - - 1 0 - 1  Suicidal thoughts - - - 0 0 - 0  PHQ-9 Score - - - 9 7 - 19  Difficult doing work/chores - - - Somewhat difficult Somewhat difficult - -  Some recent data might be hidden    Review of Systems  Constitutional: Negative.   HENT: Negative.    Eyes: Negative.   Respiratory: Negative.    Cardiovascular: Negative.   Gastrointestinal: Negative.   Endocrine: Negative.   Genitourinary: Negative.   Musculoskeletal:  Positive for arthralgias, back pain, gait problem and neck pain.  Skin: Negative.   Allergic/Immunologic: Negative.   Hematological: Negative.   Psychiatric/Behavioral: Negative.    All other systems reviewed and are negative.     Objective:   Physical Exam  General: No acute distress HEENT: EOMI, oral membranes moist Cards: reg rate  Chest: normal effort Abdomen: Soft, NT, ND Skin: dry, intact Extremities: no edema Psych: pleasant and appropriate  Neuro Alert and oriented x 3. Normal insight and awareness. Intact Memory. Normal language and speech. Cranial nerve exam unremarkable Sensory 1/2 LUE and LLE. Motor 4/5 on left 5/5 on right. .  Musculoskeletal: LB tender to touch in upper lumbar paraspinals once again. Pain worse with extension and facet maneuvers. Less with flexion. Still favors left side quite a bit.               Assessment & Plan:  1.  Left-sided hemiparesis secondary to right thalamic infarction             -Secondary stroke prophylaxis with Plavix 75 mg daily and aspirin 325 mg daily             -HEP             -RW for safety and posture             -heat, modalities discussed, good mechanics 2.  Pain management/history of migraines             -continue topiramate 50mg  bid             -Gabapentin  for neuropathic pain 300 mg 3 times daily                         -Increased to 600mg  tid                         -working better now that she's taking scheduled. 3.  Mood:  Per primary, lexapro, xanax 4.  Spasticity: controlled Baclofen 5 mg 3 times daily----DC 5. GERD- protonix                         -stop nsaids other than ECASA                         -GI f/u as directed 6. Low back pain- likely d/t gait mechanics, mild spondylosis and facet arthropathy             -xrays reviewed again             - heat             -HEP was reviewed  -discussed gait mechanics   15 minutes of face to face patient care time were spent during this visit. All questions were encouraged and answered.  Follow up with me in 4 mos .

## 2020-12-02 NOTE — Patient Instructions (Signed)
WORK ON THE STRETCH I SHOWED YOU.  BEND AT THE WAIST WITH KNEES EXTENDED UNTIL YOU FEEL A PULL ON YOUR LOW BACK.    GOOD WALKING MECHANICS!!!!

## 2020-12-02 NOTE — Therapy (Signed)
[facility] [place], [place], [address] Phone: [phone]   Fax:  [phone]  Patient Details  Name: Joyce [name] MRN: [id number] Date of Birth: 1975/06/08 Referring Provider:  No ref. provider found  Encounter Date: 12/02/2020  OCCUPATIONAL THERAPY DISCHARGE SUMMARY  Visits from Start of Care: 8  Current functional level related to goals / functional outcomes:  Patient did not return to clinic after last OT session on 08/14/20. Will be discharged due to noncompliance. OT Long Term Goals - 06/26/20 1444                OT LONG TERM GOAL #1     Title Patient will complete B/IADLs, leisure tasks at highest level of I possible.     Time 6     Period Weeks     Status On-going           OT LONG TERM GOAL #2     Title Patient will improve left upper extremity grip strength to 15# or better and pinch strength to 7# or better for increased independence in opening containers.     Time 6     Period Weeks     Status On-going           OT LONG TERM GOAL #3     Title Patient will decrease completion time on nine hole peg test to 30" or less with left hand for improved coordination needed to tie shoes, braid hair, manipulate change.     Time 6     Period Weeks     Status On-going           OT LONG TERM GOAL #4     Title Patient will improve left hand sensation to [facility] with decreased pins and needles sensation.     Time 6     Period Weeks     Status On-going       Remaining deficits: Decreased hand sensation, decreased coordination, decreased hand strength, difficulty completing ADL tasks.    Education / Equipment: Sensation changes after CVA, shoulder table stretches, fine motor coordination HEP.   Patient agrees to discharge. Patient goals were not met. Patient is being discharged due to not returning since the last visit.Ailene Ravel, OTR/L,CBIS  [phone]   12/02/2020, 3:26 PM  [facility] [address] [place], [place], [address] Phone: [phone]   Fax:  [phone]

## 2020-12-07 ENCOUNTER — Encounter (HOSPITAL_COMMUNITY): Payer: Medicaid Other | Admitting: Physical Therapy

## 2020-12-08 DIAGNOSIS — R41840 Attention and concentration deficit: Secondary | ICD-10-CM | POA: Diagnosis not present

## 2020-12-08 DIAGNOSIS — F410 Panic disorder [episodic paroxysmal anxiety] without agoraphobia: Secondary | ICD-10-CM | POA: Diagnosis not present

## 2020-12-08 DIAGNOSIS — F411 Generalized anxiety disorder: Secondary | ICD-10-CM | POA: Diagnosis not present

## 2020-12-08 DIAGNOSIS — F331 Major depressive disorder, recurrent, moderate: Secondary | ICD-10-CM | POA: Diagnosis not present

## 2020-12-09 ENCOUNTER — Encounter (HOSPITAL_COMMUNITY): Payer: Medicaid Other | Admitting: Physical Therapy

## 2020-12-17 ENCOUNTER — Other Ambulatory Visit: Payer: Self-pay | Admitting: Nurse Practitioner

## 2020-12-18 ENCOUNTER — Other Ambulatory Visit: Payer: Self-pay | Admitting: Nurse Practitioner

## 2020-12-18 MED ORDER — AMLODIPINE BESYLATE 5 MG PO TABS: ORAL_TABLET | Freq: Every day | ORAL | 0 refills | Status: DC

## 2021-01-14 DIAGNOSIS — F411 Generalized anxiety disorder: Secondary | ICD-10-CM | POA: Diagnosis not present

## 2021-01-14 DIAGNOSIS — F331 Major depressive disorder, recurrent, moderate: Secondary | ICD-10-CM | POA: Diagnosis not present

## 2021-01-14 DIAGNOSIS — F410 Panic disorder [episodic paroxysmal anxiety] without agoraphobia: Secondary | ICD-10-CM | POA: Diagnosis not present

## 2021-01-14 DIAGNOSIS — R41840 Attention and concentration deficit: Secondary | ICD-10-CM | POA: Diagnosis not present

## 2021-01-18 DIAGNOSIS — H903 Sensorineural hearing loss, bilateral: Secondary | ICD-10-CM | POA: Diagnosis not present

## 2021-01-18 DIAGNOSIS — H9203 Otalgia, bilateral: Secondary | ICD-10-CM | POA: Diagnosis not present

## 2021-02-11 DIAGNOSIS — F331 Major depressive disorder, recurrent, moderate: Secondary | ICD-10-CM | POA: Diagnosis not present

## 2021-02-11 DIAGNOSIS — F411 Generalized anxiety disorder: Secondary | ICD-10-CM | POA: Diagnosis not present

## 2021-02-11 DIAGNOSIS — F410 Panic disorder [episodic paroxysmal anxiety] without agoraphobia: Secondary | ICD-10-CM | POA: Diagnosis not present

## 2021-02-14 ENCOUNTER — Other Ambulatory Visit: Payer: Self-pay | Admitting: Nurse Practitioner

## 2021-02-15 MED ORDER — AMLODIPINE BESYLATE 5 MG PO TABS: ORAL_TABLET | Freq: Every day | ORAL | 0 refills | Status: DC

## 2021-02-15 MED ORDER — ATORVASTATIN CALCIUM 80 MG PO TABS: ORAL_TABLET | Freq: Every day | ORAL | 0 refills | Status: DC

## 2021-02-24 ENCOUNTER — Telehealth: Payer: Self-pay | Admitting: Nurse Practitioner

## 2021-02-25 ENCOUNTER — Ambulatory Visit: Payer: Medicaid Other | Admitting: Gastroenterology

## 2021-04-07 ENCOUNTER — Encounter: Payer: Medicaid Other | Attending: Physical Medicine & Rehabilitation | Admitting: Physical Medicine & Rehabilitation

## 2021-04-19 ENCOUNTER — Other Ambulatory Visit: Payer: Self-pay | Admitting: Nurse Practitioner

## 2021-04-20 ENCOUNTER — Encounter: Payer: Self-pay | Admitting: Internal Medicine

## 2021-04-20 ENCOUNTER — Other Ambulatory Visit: Payer: Self-pay

## 2021-04-20 ENCOUNTER — Ambulatory Visit: Payer: Medicaid Other | Admitting: Internal Medicine

## 2021-04-20 VITALS — BP 146/98 | HR 77 | Resp 18 | Ht 65.0 in | Wt 235.1 lb

## 2021-04-20 DIAGNOSIS — M792 Neuralgia and neuritis, unspecified: Secondary | ICD-10-CM | POA: Diagnosis not present

## 2021-04-20 DIAGNOSIS — Z23 Encounter for immunization: Secondary | ICD-10-CM

## 2021-04-20 DIAGNOSIS — M25522 Pain in left elbow: Secondary | ICD-10-CM | POA: Diagnosis not present

## 2021-04-20 DIAGNOSIS — F411 Generalized anxiety disorder: Secondary | ICD-10-CM | POA: Diagnosis not present

## 2021-04-20 DIAGNOSIS — M5431 Sciatica, right side: Secondary | ICD-10-CM

## 2021-04-20 DIAGNOSIS — F331 Major depressive disorder, recurrent, moderate: Secondary | ICD-10-CM | POA: Diagnosis not present

## 2021-04-20 DIAGNOSIS — F410 Panic disorder [episodic paroxysmal anxiety] without agoraphobia: Secondary | ICD-10-CM | POA: Diagnosis not present

## 2021-04-20 MED ORDER — AMLODIPINE BESYLATE 5 MG PO TABS: ORAL_TABLET | Freq: Every day | ORAL | 0 refills | Status: DC

## 2021-04-20 MED ORDER — ATORVASTATIN CALCIUM 80 MG PO TABS: ORAL_TABLET | Freq: Every day | ORAL | 0 refills | Status: DC

## 2021-04-20 MED ORDER — PREDNISONE 10 MG (21) PO TBPK
ORAL_TABLET | ORAL | 0 refills | Status: DC
Start: 2021-04-20 — End: 2021-06-06

## 2021-04-20 MED ORDER — ESCITALOPRAM OXALATE 20 MG PO TABS: ORAL_TABLET | Freq: Every day | ORAL | 0 refills | Status: DC

## 2021-04-20 NOTE — Patient Instructions (Addendum)
Please take Prednisone as prescribed for hip pain/sciatica.  Please continue stretching exercises as advised by your PM&R provider.  Please contact Dr Naaman Plummer for chronic neuropathic pain.

## 2021-04-20 NOTE — Progress Notes (Signed)
Acute Office Visit  Subjective:    Patient ID: Joyce [name], female    DOB: [birth year], 45 y.o.   MRN: [id number]  Chief Complaint  Patient presents with   Pain    Pt having left arm pain she feel after having stroke and has been hurting since getting worse also pt having right hip pain after she walks a lot down the leg     HPI Patient is in today for c/o left elbow pain, which is chronic since she had CVA. No localized swelling or warmth is noted. She has been having neuropathic pain since CVA and has been taking Gabapentin 600 mg TID, followed by Dr Naaman Plummer.  She also c/o chronic low back pain and right hip and leg pain, which appear to be radiating pain from back. She has intermittent numbness and weakness of legs since CVA. She has been using rolling walker. Denies any recent fall or injury.  Past Medical History:  Diagnosis Date   Acute CVA (cerebrovascular accident) ([place]) 03/04/2020   Acute CVA (cerebrovascular accident) ([place]) 03/04/2020   Acute left-sided weakness 03/05/2020   AMA (advanced maternal age) multigravida 35+ 11/12/2014   Anemia    Depression    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   Hypertension    Migraine    Pregnant 11/12/2014   PTSD (post-traumatic stress disorder) 05/01/2017   Round ligament pain 11/12/2014   Short of breath on exertion 11/12/2014   Trichomonas infection     Past Surgical History:  Procedure Laterality Date   CHOLECYSTECTOMY      Family History  Problem Relation Age of Onset   Diabetes Mother    Hypertension Mother    Kidney disease Mother    Asthma Mother    Hyperlipidemia Mother    Alzheimer's disease Father    Diabetes Father    Kidney disease Sister    Asthma Son    Diabetes Sister    Asthma Son    ADD / ADHD Son    Heart murmur Son    Asthma Son    Colon cancer Neg Hx    Colon polyps Neg Hx     Social History   Socioeconomic History   Marital status: Significant Other    Spouse name: Engineer, materials   Number of children: 8    Years of education: 9   Highest education level: Not on file  Occupational History   Occupation: unemployed    Comment: disabled  Tobacco Use   Smoking status: Never   Smokeless tobacco: Never  Vaping Use   Vaping Use: Never used  Substance and Sexual Activity   Alcohol use: Not Currently    Comment: occasionally   Drug use: No   Sexual activity: Yes    Birth control/protection: I.U.D.    Comment: followed by GYN  Other Topics Concern   Not on file  Social History Narrative   Lives with fiance and kids   Right Handed   Drinks >12 cans of soda in caffeine   Social Determinants of Health   Financial Resource Strain: Not on file  Food Insecurity: Not on file  Transportation Needs: Not on file  Physical Activity: Not on file  Stress: Not on file  Social Connections: Not on file  Intimate Partner Violence: Not on file    Outpatient Medications Prior to Visit  Medication Sig Dispense Refill   ALPRAZolam (XANAX) 0.25 MG tablet Take 0.25 mg by mouth daily as needed.     amLODipine (NORVASC) 5  MG tablet TAKE 1 TABLET (5 MG TOTAL) BY MOUTH DAILY. 30 tablet 0   aspirin 325 MG EC tablet Take 1 tablet (325 mg total) by mouth daily. 100 tablet 0   atorvastatin (LIPITOR) 80 MG tablet TAKE 1 TABLET (80 MG TOTAL) BY MOUTH DAILY. 30 tablet 0   buPROPion (WELLBUTRIN XL) 150 MG 24 hr tablet Take 150 mg by mouth every morning.     buPROPion (WELLBUTRIN XL) 300 MG 24 hr tablet Take 300 mg by mouth every morning.     busPIRone (BUSPAR) 10 MG tablet Take 10 mg by mouth 2 (two) times daily.     dicyclomine (BENTYL) 10 MG capsule Take 1 capsule (10 mg total) by mouth 4 (four) times daily -  before meals and at bedtime. Stop if constipated 120 capsule 3   escitalopram (LEXAPRO) 20 MG tablet TAKE 1 TABLET (20 MG TOTAL) BY MOUTH DAILY. 20 tablet 0   fluticasone (FLONASE) 50 MCG/ACT nasal spray Use 2 sprays in each nostril BID for a week. After 1 week, decrease to 1 spray in each nostril BID as  needed for congestion/allergies. 16 g 6   gabapentin (NEURONTIN) 600 MG tablet Take 1 tablet (600 mg total) by mouth 3 (three) times daily. 90 tablet 5   ibuprofen (ADVIL) 600 MG tablet Take 1 tablet (600 mg total) by mouth every 6 (six) hours as needed. 30 tablet 0   pantoprazole (PROTONIX) 40 MG tablet Take 1 tablet (40 mg total) by mouth daily. 30 tablet 5   PARAGARD INTRAUTERINE COPPER IU by Intrauterine route.     traZODone (DESYREL) 100 MG tablet Take 100 mg by mouth at bedtime.     methocarbamol (ROBAXIN) 500 MG tablet Take 1 tablet (500 mg total) by mouth every 8 (eight) hours as needed for muscle spasms. 90 tablet 1   methocarbamol (ROBAXIN) 750 MG tablet Take 750 mg by mouth at bedtime as needed.     topiramate (TOPAMAX) 50 MG tablet Take 1 tablet (50 mg total) by mouth 2 (two) times daily. Start 1 tablet daily x 1 week and then twice daily (Patient not taking: Reported on 04/20/2021) 60 tablet 3   amLODipine (NORVASC) 5 MG tablet TAKE 1 TABLET (5 MG TOTAL) BY MOUTH DAILY. (Patient not taking: Reported on 04/20/2021) 30 tablet 0   No facility-administered medications prior to visit.    No Known Allergies  Review of Systems  Constitutional:  Negative for chills and fever.  Eyes:  Negative for pain and discharge.  Respiratory:  Negative for cough and shortness of breath.   Cardiovascular:  Negative for chest pain and palpitations.  Gastrointestinal:  Negative for diarrhea, nausea and vomiting.  Musculoskeletal:  Positive for arthralgias, back pain, gait problem and neck pain. Negative for neck stiffness.  Skin:  Negative for rash.  Neurological:  Positive for weakness. Negative for dizziness.  Psychiatric/Behavioral:  Positive for sleep disturbance. Negative for agitation and behavioral problems. The patient is nervous/anxious.       Objective:    Physical Exam Vitals reviewed.  Constitutional:      General: She is not in acute distress.    Appearance: She is not  diaphoretic.     Comments: Has a rolling walker  HENT:     Head: Normocephalic and atraumatic.  Eyes:     General: No scleral icterus.    Extraocular Movements: Extraocular movements intact.  Cardiovascular:     Rate and Rhythm: Normal rate and regular rhythm.  Pulses: Normal pulses.     Heart sounds: Normal heart sounds. No murmur heard. Pulmonary:     Breath sounds: Normal breath sounds. No wheezing or rales.  Musculoskeletal:        General: Tenderness (Lumbar spine area) present.     Cervical back: Neck supple. No tenderness.     Right lower leg: No edema.     Left lower leg: No edema.  Skin:    General: Skin is warm.     Findings: No rash.  Neurological:     General: No focal deficit present.     Mental Status: She is alert and oriented to person, place, and time.  Psychiatric:        Mood and Affect: Mood normal.        Behavior: Behavior normal.    BP (!) 146/98 (BP Location: Left Arm, Patient Position: Sitting, Cuff Size: Normal)   Pulse 77   Resp 18   Ht 5\' 5"  (1.651 m)   Wt 235 lb 1.9 oz (106.6 kg)   SpO2 98%   BMI 39.13 kg/m  Wt Readings from Last 3 Encounters:  04/20/21 235 lb 1.9 oz (106.6 kg)  12/02/20 240 lb (108.9 kg)  11/23/20 221 lb (100.2 kg)        Assessment & Plan:   Problem List Items Addressed This Visit       Other   Neuropathic pain Elbow pain appears to be one of hyperalgesia from CVA On Gabapentin 600 mg TID   Other Visit Diagnoses     Sciatica of right side    -  Primary Sterapred taper  Stretching exercises Advised to check with PM&R provider for possible steroid injection    Relevant Medications   predniSONE (STERAPRED UNI-PAK 21 TAB) 10 MG (21) TBPK tablet    Left elbow pain     As mentioned in neuropathic pain Less likely to be MSK in etiology without any local swelling or warmth         Meds ordered this encounter  Medications   predniSONE (STERAPRED UNI-PAK 21 TAB) 10 MG (21) TBPK tablet    Sig: Take as  package instructions.    Dispense:  1 each    Refill:  0     Gianny Killman Keith Rake, MD

## 2021-04-21 DIAGNOSIS — Z23 Encounter for immunization: Secondary | ICD-10-CM | POA: Diagnosis not present

## 2021-04-25 ENCOUNTER — Other Ambulatory Visit: Payer: Self-pay | Admitting: Family Medicine

## 2021-05-25 ENCOUNTER — Ambulatory Visit: Payer: Medicaid Other | Admitting: Nurse Practitioner

## 2021-06-06 ENCOUNTER — Other Ambulatory Visit: Payer: Self-pay | Admitting: Family Medicine

## 2021-06-06 ENCOUNTER — Other Ambulatory Visit: Payer: Self-pay | Admitting: Internal Medicine

## 2021-06-06 DIAGNOSIS — M5431 Sciatica, right side: Secondary | ICD-10-CM

## 2021-06-07 MED ORDER — PREDNISONE 10 MG (21) PO TBPK: ORAL_TABLET | ORAL | 0 refills | Status: DC

## 2021-06-07 MED ORDER — AMLODIPINE BESYLATE 5 MG PO TABS: ORAL_TABLET | Freq: Every day | ORAL | 0 refills | Status: DC

## 2021-06-07 MED ORDER — ESCITALOPRAM OXALATE 20 MG PO TABS: ORAL_TABLET | Freq: Every day | ORAL | 0 refills | Status: DC

## 2021-06-07 MED ORDER — ATORVASTATIN CALCIUM 80 MG PO TABS: ORAL_TABLET | Freq: Every day | ORAL | 0 refills | Status: DC

## 2021-06-10 ENCOUNTER — Telehealth: Payer: Self-pay

## 2021-06-10 NOTE — Telephone Encounter (Signed)
I have called pt and her mail box is full.  She called this am and wanted to know why Dr Posey Pronto could not take her as PCP.  Dr Posey Pronto said he will not be able to follow her as PCP.  He does not feel he can offer her the care she needs.

## 2021-06-18 ENCOUNTER — Ambulatory Visit: Payer: Self-pay | Admitting: *Deleted

## 2021-06-18 NOTE — Telephone Encounter (Signed)
°  Chief Complaint: blood in mucus/sputum Symptoms: blood seen when sneezes and blows nose Frequency: 2 weeks Pertinent Negatives: Patient denies nosebleeds Disposition: [] ED /[x] Urgent Care (no appt availability in office) / [] Appointment(In office/virtual)/ []  [facility]/ [] Home Care/ [] Refused Recommended Disposition /[] [facility]/ []  Follow-up with PCP Additional Notes: Patient is concerned about the blood she is seeing- advised UC for evaluation- no PCP at this time

## 2021-06-18 NOTE — Telephone Encounter (Signed)
Patient is stating she has blood in sputum- sees blood when she sneezes and in mucus. Started 2 weeks ago and patient is concerned. Patient does not have history of nose bleeds. Patient has had recent stroke and has recently stopped blood thinner. Answer Assessment - Initial Assessment Questions 1. AMOUNT OF BLEEDING: "How bad is the bleeding?" "How much blood was lost?" "Has the bleeding stopped?"   - MILD: needed a couple tissues   - MODERATE: needed many tissues   - SEVERE: large blood clots, soaked many tissues, lasted more than 30 minutes      Blood only seen in mucus 2. ONSET: "When did the nosebleed start?"      2 weeks 3. FREQUENCY: "How many nosebleeds have you had in the last 24 hours?"      Just in sputum and mucus 4. RECURRENT SYMPTOMS: "Have there been other recent nosebleeds?" If Yes, ask: "How long did it take you to stop the bleeding?" "What worked best?"      no 5. CAUSE: "What do you think caused this nosebleed?"     unsure 6. LOCAL FACTORS: "Do you have any cold symptoms?", "Have you been rubbing or picking at your nose?"     Some congestion- patient states she feels a clog and blows to get it out- sees blood 7. SYSTEMIC FACTORS: "Do you have high blood pressure or any bleeding problems?"     Recent stroke 8. BLOOD THINNERS: "Do you take any blood thinners?" (e.g., aspirin, clopidogrel / Plavix, coumadin, heparin). Notes: Other strong blood thinners include: Arixtra (fondaparinux), Eliquis (apixaban), Pradaxa (dabigatran), and Xarelto (rivaroxaban).     Recently stopped 9. OTHER SYMPTOMS: "Do you have any other symptoms?" (e.g., lightheadedness)     Anemia hx 10. PREGNANCY: "Is there any chance you are pregnant?" "When was your last menstrual period?"       *No Answer*  Protocols used: Nosebleed-A-AH

## 2021-06-23 ENCOUNTER — Other Ambulatory Visit: Payer: Self-pay

## 2021-06-23 ENCOUNTER — Encounter: Payer: Medicaid Other | Attending: Physical Medicine & Rehabilitation | Admitting: Physical Medicine & Rehabilitation

## 2021-06-23 ENCOUNTER — Encounter: Payer: Self-pay | Admitting: Physical Medicine & Rehabilitation

## 2021-06-23 VITALS — BP 149/92 | HR 77 | Temp 98.7°F | Ht 65.0 in | Wt 234.0 lb

## 2021-06-23 DIAGNOSIS — M5416 Radiculopathy, lumbar region: Secondary | ICD-10-CM | POA: Insufficient documentation

## 2021-06-23 DIAGNOSIS — G8194 Hemiplegia, unspecified affecting left nondominant side: Secondary | ICD-10-CM | POA: Insufficient documentation

## 2021-06-23 DIAGNOSIS — M7712 Lateral epicondylitis, left elbow: Secondary | ICD-10-CM | POA: Insufficient documentation

## 2021-06-23 DIAGNOSIS — M7711 Lateral epicondylitis, right elbow: Secondary | ICD-10-CM | POA: Insufficient documentation

## 2021-06-23 DIAGNOSIS — G43409 Hemiplegic migraine, not intractable, without status migrainosus: Secondary | ICD-10-CM | POA: Diagnosis not present

## 2021-06-23 HISTORY — DX: Radiculopathy, lumbar region: M54.16

## 2021-06-23 MED ORDER — PREDNISONE 20 MG PO TABS: 20.0000 mg | ORAL_TABLET | ORAL | 0 refills | Status: DC

## 2021-06-23 MED ORDER — TOPIRAMATE 50 MG PO TABS: 50.0000 mg | ORAL_TABLET | Freq: Two times a day (BID) | ORAL | 3 refills | Status: DC

## 2021-06-23 NOTE — Patient Instructions (Signed)
For your elbow: Wear forearm brace/band Ice three x daily 30 minutes Prednisone

## 2021-06-23 NOTE — Progress Notes (Signed)
Subjective:    Patient ID: Joyce [name], female    DOB: [birth year], 46 y.o.   MRN: [id number]  HPI  Mrs. Glatt is here in follow up of her right thalamic infarction. She complains of shar pain in her left elbow when she picks up objects or if she moves it quickly.  Apparently she was given oral steroids to take which provided some relief temporarily.  Currently she finds it difficult even to take on her take off her shirts and blouses.  She also complains of right hip pain when she is walking or with genearl acticity. She is also having pain when she sleeps at night.  Her muscle relaxer does help her to rest at nighttime.  We discussed her back pain at last visit.  She has been trying to walk using good form.  She does not do a lot of stretches but she does stay fairly active.  She denies any falls or mishaps.    Pain Inventory Average Pain 9 Pain Right Now 9 My pain is sharp and aching  In the last 24 hours, has pain interfered with the following? General activity 10 Relation with others 10 Enjoyment of life 10 What TIME of day is your pain at its worst? evening and night Sleep (in general) Poor  Pain is worse with: walking, bending, sitting, standing, and some activites Pain improves with: rest Relief from Meds:  not sure  Family History  Problem Relation Age of Onset   Diabetes Mother    Hypertension Mother    Kidney disease Mother    Asthma Mother    Hyperlipidemia Mother    Alzheimer's disease Father    Diabetes Father    Kidney disease Sister    Asthma Son    Diabetes Sister    Asthma Son    ADD / ADHD Son    Heart murmur Son    Asthma Son    Colon cancer Neg Hx    Colon polyps Neg Hx    Social History   Socioeconomic History   Marital status: Significant Other    Spouse name: Engineer, materials   Number of children: 8   Years of education: 9   Highest education level: Not on file  Occupational History   Occupation: unemployed    Comment: disabled   Tobacco Use   Smoking status: Never   Smokeless tobacco: Never  Vaping Use   Vaping Use: Never used  Substance and Sexual Activity   Alcohol use: Not Currently    Comment: occasionally   Drug use: No   Sexual activity: Yes    Birth control/protection: I.U.D.    Comment: followed by GYN  Other Topics Concern   Not on file  Social History Narrative   Lives with fiance and kids   Right Handed   Drinks >12 cans of soda in caffeine   Social Determinants of Health   Financial Resource Strain: Not on file  Food Insecurity: Not on file  Transportation Needs: Not on file  Physical Activity: Not on file  Stress: Not on file  Social Connections: Not on file   Past Surgical History:  Procedure Laterality Date   CHOLECYSTECTOMY     Past Surgical History:  Procedure Laterality Date   CHOLECYSTECTOMY     Past Medical History:  Diagnosis Date   Acute CVA (cerebrovascular accident) ([place]) 03/04/2020   Acute CVA (cerebrovascular accident) ([place]) 03/04/2020   Acute left-sided weakness 03/05/2020   AMA (advanced maternal age) multigravida  35+ 11/12/2014   Anemia    Depression    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   Hypertension    Migraine    Pregnant 11/12/2014   PTSD (post-traumatic stress disorder) 05/01/2017   Round ligament pain 11/12/2014   Short of breath on exertion 11/12/2014   Trichomonas infection    BP (!) 149/92 (BP Location: Right Arm)    Pulse 77    Temp 98.7 F (37.1 C) (Oral)    Ht 5\' 5"  (1.651 m)    Wt 234 lb (106.1 kg)    SpO2 97%    BMI 38.94 kg/m   Opioid Risk Score:   Fall Risk Score:  `1  Depression screen PHQ 2/9  Depression screen [facility] 2/9 04/20/2021 11/23/2020 10/21/2020 10/06/2020 06/19/2020 05/15/2020 05/06/2020  Decreased Interest 0 0 0 0 1 1 1   Down, Depressed, Hopeless 0 0 0 1 1 1 1   PHQ - 2 Score 0 0 0 1 2 2 2   Altered sleeping 0 - - - 1 1 -  Tired, decreased energy 0 - - - 2 1 -  Change in appetite 0 - - - 1 1 -  Feeling bad or failure about yourself  0 -  - - 1 1 -  Trouble concentrating 0 - - - 1 1 -  Moving slowly or fidgety/restless 0 - - - 1 0 -  Suicidal thoughts 0 - - - 0 0 -  PHQ-9 Score 0 - - - 9 7 -  Difficult doing work/chores Not difficult at all - - - Somewhat difficult Somewhat difficult -  Some recent data might be hidden       Review of Systems  Constitutional: Negative.   HENT: Negative.    Eyes: Negative.   Respiratory: Negative.    Cardiovascular: Negative.   Gastrointestinal: Negative.   Endocrine: Negative.   Genitourinary: Negative.   Musculoskeletal:        Right hip pain , pain in left elbow  Skin: Negative.   Allergic/Immunologic: Negative.   Neurological:  Positive for numbness.       Left leg  Hematological: Negative.   Psychiatric/Behavioral: Negative.        Objective:   Physical Exam General: No acute distres.  Obese HEENT: EOMI, oral membranes moist Cards: reg rate  Chest: normal effort Abdomen: Soft, NT, ND Skin: dry, intact Extremities: no edema Psych: pleasant and appropriate  Neuro Alert and oriented x 3. Normal insight and awareness. Intact Memory. Normal language and speech. Cranial nerve exam unremarkable Sensory 1/2 LUE and LLE. Motor 4/5 on left 5/5 on right.  No significant changes here.  She does walk with reasonable balance and weight shift.  Musculoskeletal: She is tender to palpation in the lumbar spine as well as the right greater than left PSIS areas.  Flexion seem to cause more pain in extension and she did have some antalgia with weightbearing on the right side.  Left elbow is tender at the lateral epicondyle and she has pain with palpation of the extensor tendon as well as with resistance of the left wrist and finger extenders.          Assessment & Plan:  1.  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  Pain management/history of migraines             -Resume topiramate 50mg  bid             -Gabapentin for neuropathic pain 300 mg 3 times daily                         -Increased to 600mg  tid                         -working better now that she's taking scheduled. 3.  Mood:  Per primary, lexapro, xanax 4.  Spasticity: controlled Baclofen 5 mg 3 times daily----DC 5. GERD- protonix                         -stop nsaids other than ECASA                         -GI f/u as directed 6. Low back pain-patient has spondylosis and facet arthropathy on her most recent x-ray from last year.  Given that her symptoms are continuing to be problematic and in fact may be worsening with radicular signs now, I ordered an MRI of her lumbar spine without contrast  7.  Left elbow pain consistent with lateral epicondylitis  -Ice, elbow band/splint  -Prednisone taper   -Steroid injection if she fails these conservative measures  15 minutes of face to face patient care time were spent during this visit. All questions were encouraged and answered.  Follow up with me in 2 months s .        Assessment & Plan:

## 2021-07-04 ENCOUNTER — Ambulatory Visit
Admission: RE | Admit: 2021-07-04 | Discharge: 2021-07-04 | Disposition: A | Discharge status: Home / Self Care | Payer: Medicaid Other | Source: Ambulatory Visit | Attending: Physical Medicine & Rehabilitation | Admitting: Physical Medicine & Rehabilitation

## 2021-07-04 ENCOUNTER — Other Ambulatory Visit: Payer: Self-pay

## 2021-07-04 DIAGNOSIS — M47816 Spondylosis without myelopathy or radiculopathy, lumbar region: Secondary | ICD-10-CM | POA: Diagnosis not present

## 2021-07-04 DIAGNOSIS — M5416 Radiculopathy, lumbar region: Secondary | ICD-10-CM

## 2021-07-04 DIAGNOSIS — M48061 Spinal stenosis, lumbar region without neurogenic claudication: Secondary | ICD-10-CM | POA: Diagnosis not present

## 2021-07-04 IMAGING — MR MR LUMBAR SPINE W/O CM
4 of 5 series · 18 of 48 positions shown · non-contrast
Comparison: Lumbar radiographs [DATE]. CT Abdomen and Pelvis
[DATE].

CLINICAL DATA: 45-year-old female with lumbar radiculopathy.

EXAM:
MRI LUMBAR SPINE WITHOUT CONTRAST
TECHNIQUE: Multiplanar, multisequence MR imaging of the lumbar spine was
performed. No intravenous contrast was administered.

[Series 5: T2 · sagittal · 4.0mm · 0.73mm/px · 6 of 15 slices shown (1 of 2)]
[im 1/15]
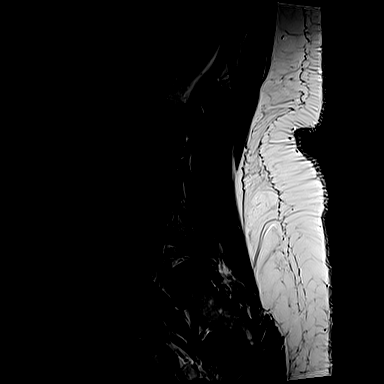
[im 3/15]
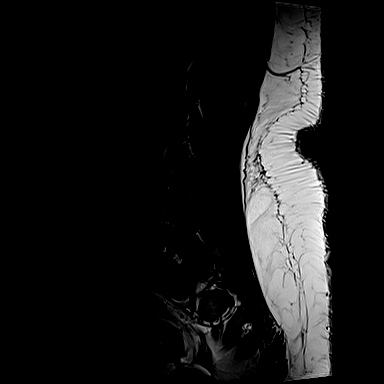
[im 6/15]
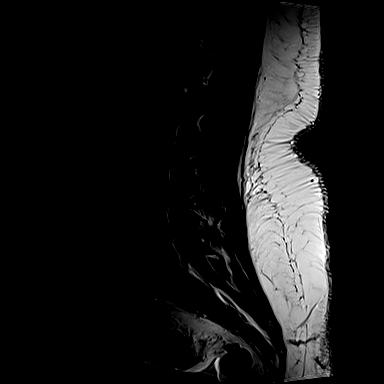
[im 9/15]
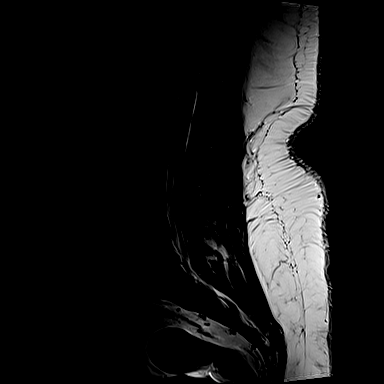
[im 12/15]
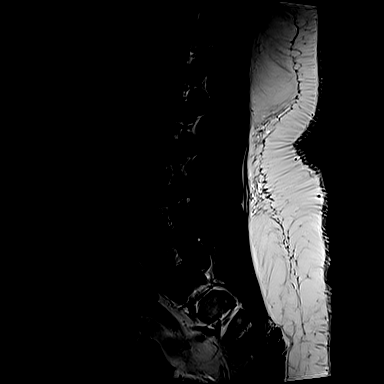
[im 15/15]
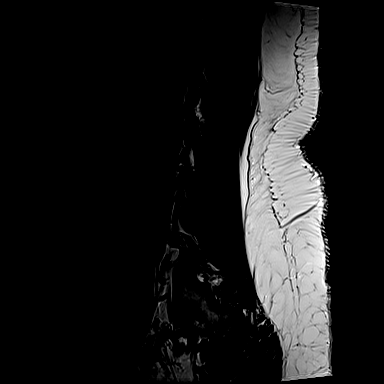

[Series 6: T1 · sagittal · 4.0mm · 0.73mm/px · 3 of 15 slices shown (1 of 2)]
[im 3/15]
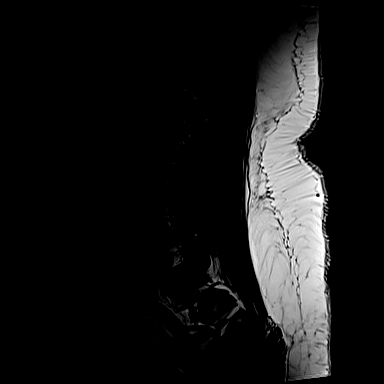
[im 9/15]
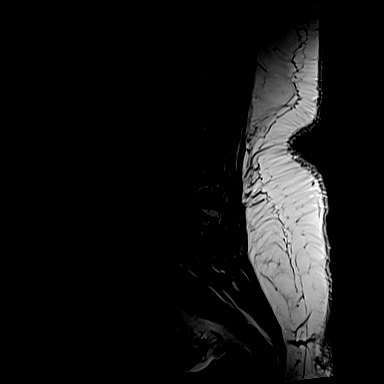
[im 15/15]
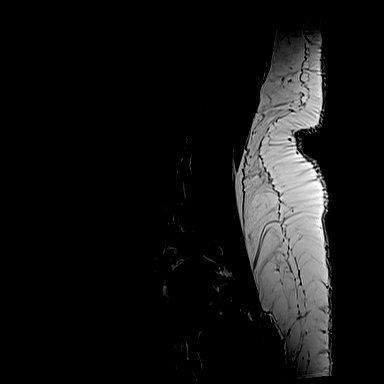

[Series 10: T1 · axial · 4.0mm · 0.28mm/px · z∈[-45,+95]mm · 3 of 38 slices shown (2 of 2)]
[im 6/38]
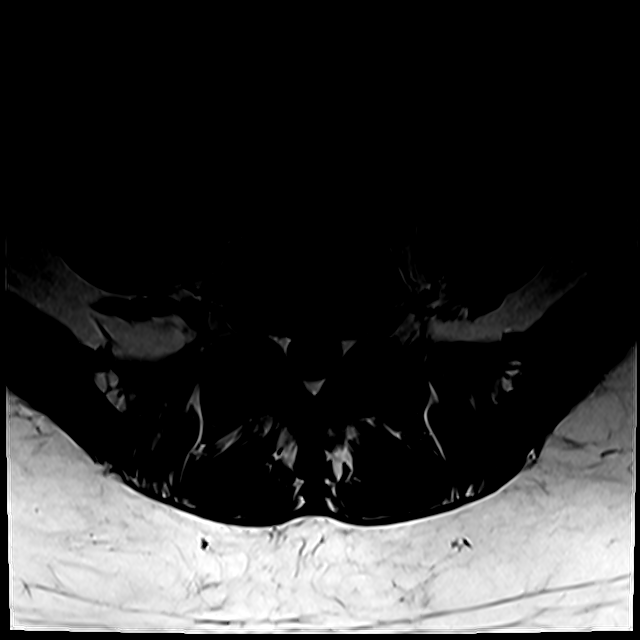
[im 19/38]
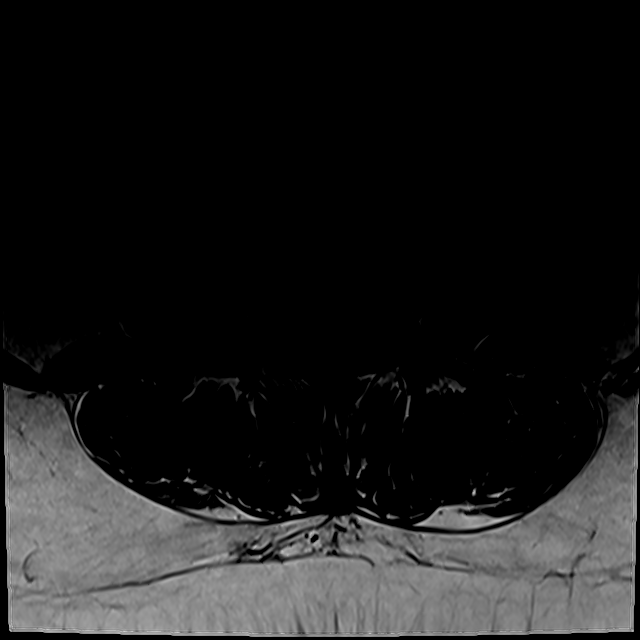
[im 32/38]
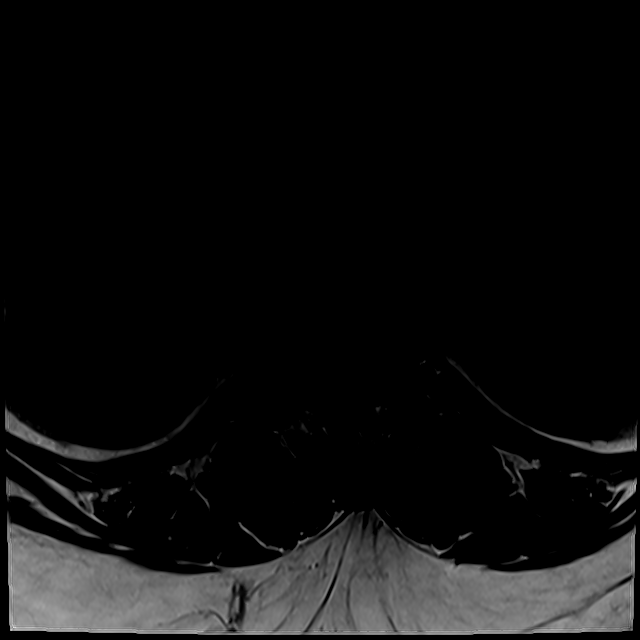

[Series 13: T2 · axial · 4.0mm · 0.28mm/px · z∈[-70,+95]mm · 6 of 38 slices shown (2 of 2)]
[im 1/38]
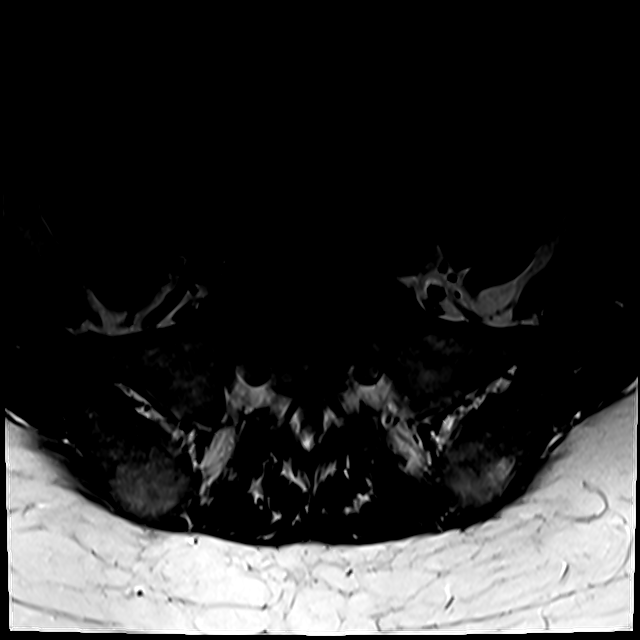
[im 6/38]
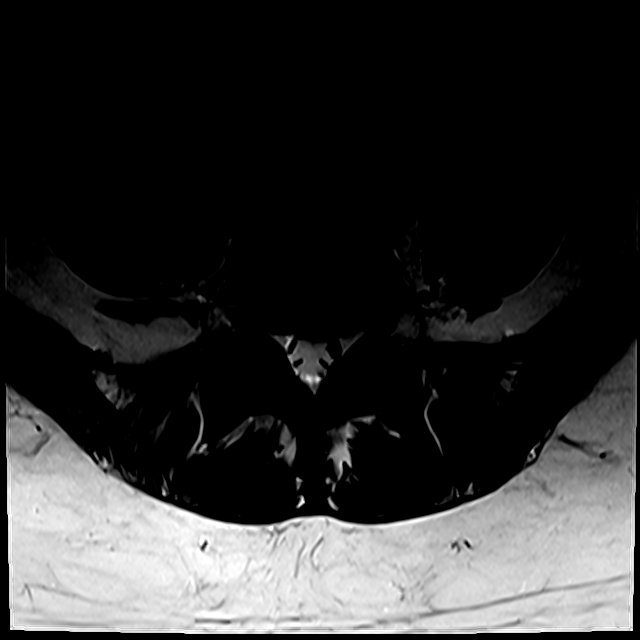
[im 11/38]
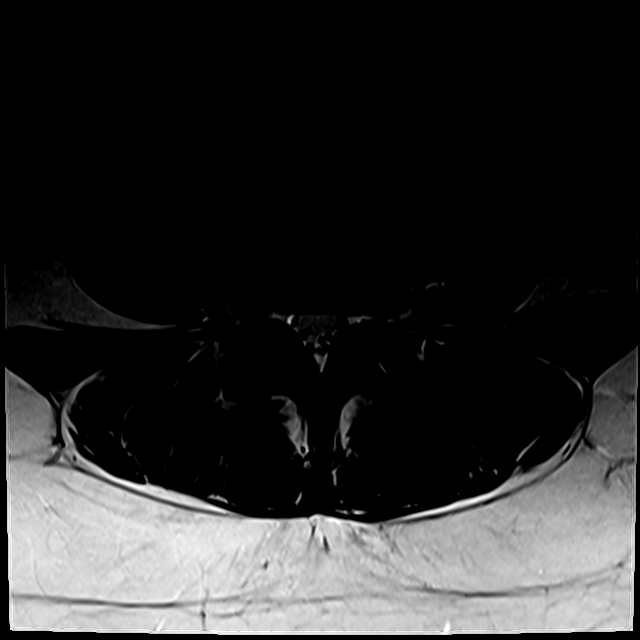
[im 16/38]
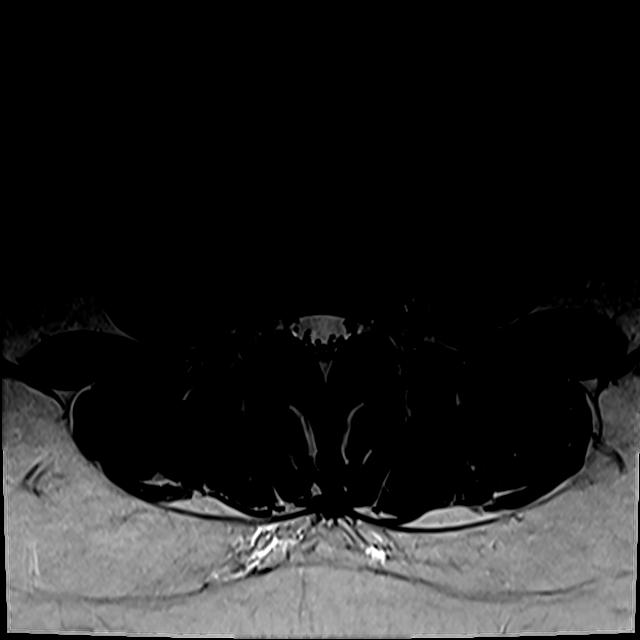
[im 19/38]
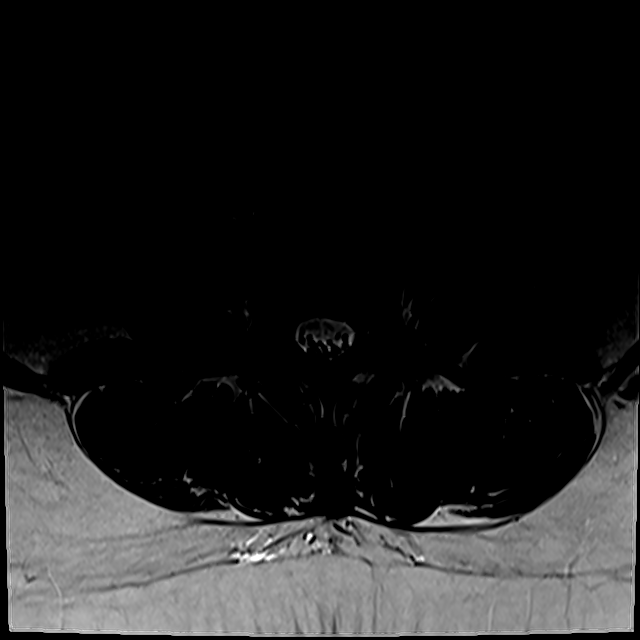
[im 32/38]
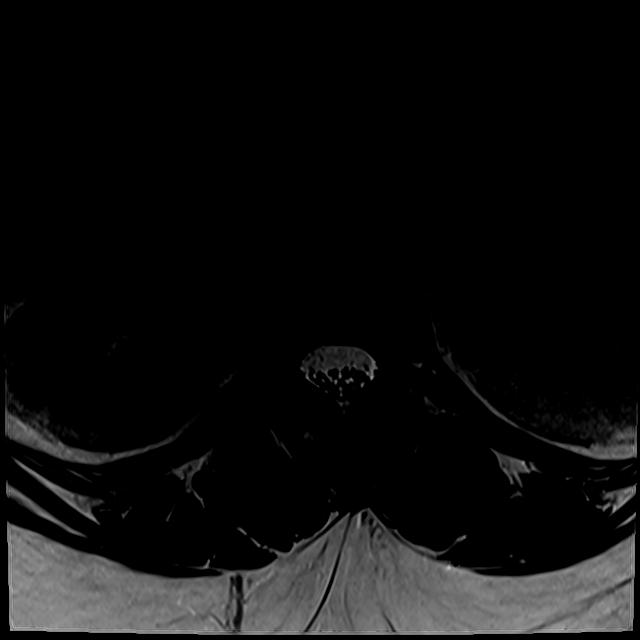

[18 of 48 positions shown; findings below may reference images not displayed]

FINDINGS: Segmentation:  Normal on the comparisons.

Alignment:  Normal lumbar lordosis.  No spondylolisthesis.

Vertebrae: Patchy benign vertebral body hemangioma suspected at L1,
with fairly typical CT appearance for hemangioma there last year.
Smaller similar benign vertebral body hemangioma suspected on the
left at L4. No convincing marrow edema or acute osseous abnormality.
Background bone marrow signal within normal limits. Intact visible
sacrum and SI joints.

Conus medullaris and cauda equina: Conus extends to the T12-L1
level. No lower spinal cord or conus signal abnormality. Capacious
spinal canal at most levels. Normal cauda equina nerve roots.

Paraspinal and other soft tissues: Negative; trace free fluid in the
cul-de-sac which is likely physiologic.

Disc levels:

T11-T12: Mild facet hypertrophy.  Mild T11 foraminal stenosis.

T12-L1:  Negative.

L1-L2:  Negative.

L2-L3: Mild disc desiccation and disc space loss. Small to moderate
left paracentral disc extrusion (series 7, image 10 and series 13,
image 16). Mild to moderate left lateral recess stenosis (descending
left L3 nerve level). Borderline to mild spinal stenosis. No
foraminal involvement or stenosis.

L3-L4:  Negative.

L4-L5: Negative disc. Moderate facet hypertrophy. Trace degenerative
facet joint fluid. No stenosis.

L5-S1: Negative disc. Moderate facet hypertrophy. Mild endplate
spurring. No significant stenosis.
IMPRESSION: 1. Isolated lumbar disc degeneration at L2-L3 with a small to
moderate left paracentral disc extrusion most affecting the left
lateral recess. Query left L3 radiculitis.

2. Otherwise moderate lower lumbar facet arthropathy. No other
lumbar spinal stenosis or convincing neural impingement.

## 2021-07-11 ENCOUNTER — Emergency Department (HOSPITAL_COMMUNITY)
Admission: EM | Admit: 2021-07-11 | Discharge: 2021-07-11 | Disposition: A | Discharge status: Home / Self Care | Payer: Medicaid Other | Attending: Emergency Medicine | Admitting: Emergency Medicine

## 2021-07-11 ENCOUNTER — Other Ambulatory Visit: Payer: Self-pay

## 2021-07-11 ENCOUNTER — Encounter (HOSPITAL_COMMUNITY): Payer: Self-pay | Admitting: *Deleted

## 2021-07-11 DIAGNOSIS — R112 Nausea with vomiting, unspecified: Secondary | ICD-10-CM | POA: Insufficient documentation

## 2021-07-11 DIAGNOSIS — Z79899 Other long term (current) drug therapy: Secondary | ICD-10-CM | POA: Insufficient documentation

## 2021-07-11 DIAGNOSIS — I10 Essential (primary) hypertension: Secondary | ICD-10-CM | POA: Diagnosis not present

## 2021-07-11 DIAGNOSIS — R197 Diarrhea, unspecified: Secondary | ICD-10-CM | POA: Diagnosis not present

## 2021-07-11 DIAGNOSIS — Z7982 Long term (current) use of aspirin: Secondary | ICD-10-CM | POA: Diagnosis not present

## 2021-07-11 LAB — COMPREHENSIVE METABOLIC PANEL
ALT: 19 U/L (ref 0–44)
AST: 18 U/L (ref 15–41)
Albumin: 3.5 g/dL (ref 3.5–5.0)
Alkaline Phosphatase: 45 U/L (ref 38–126)
Anion gap: 4 — ABNORMAL LOW (ref 5–15)
BUN: 10 mg/dL (ref 6–20)
CO2: 25 mmol/L (ref 22–32)
Calcium: 8.4 mg/dL — ABNORMAL LOW (ref 8.9–10.3)
Chloride: 105 mmol/L (ref 98–111)
Creatinine, Ser: 0.76 mg/dL (ref 0.44–1.00)
GFR, Estimated: >60 mL/min (ref >60)
Glucose, Bld: 87 mg/dL (ref 70–99)
Potassium: 3.4 mmol/L — ABNORMAL LOW (ref 3.5–5.1)
Sodium: 134 mmol/L — ABNORMAL LOW (ref 135–145)
Total Bilirubin: 0.2 mg/dL — ABNORMAL LOW (ref 0.3–1.2)
Total Protein: 6.9 g/dL (ref 6.5–8.1)

## 2021-07-11 LAB — CBC
HCT: 35.5 % — ABNORMAL LOW (ref 36.0–46.0)
Hemoglobin: 10.8 g/dL — ABNORMAL LOW (ref 12.0–15.0)
MCH: 20.9 pg — ABNORMAL LOW (ref 26.0–34.0)
MCHC: 30.4 g/dL (ref 30.0–36.0)
MCV: 68.8 fL — ABNORMAL LOW (ref 80.0–100.0)
Platelets: 323 10*3/uL (ref 150–400)
RBC: 5.16 MIL/uL — ABNORMAL HIGH (ref 3.87–5.11)
RDW: 19.6 % — ABNORMAL HIGH (ref 11.5–15.5)
WBC: 8.3 10*3/uL (ref 4.0–10.5)
nRBC: 0 % (ref 0.0–0.2)

## 2021-07-11 LAB — LIPASE, BLOOD: Lipase: 26 U/L (ref 11–51)

## 2021-07-11 MED ORDER — ONDANSETRON HCL 4 MG PO TABS: 4.0000 mg | ORAL_TABLET | Freq: Four times a day (QID) | ORAL | 0 refills | Status: DC

## 2021-07-11 MED ORDER — ONDANSETRON 4 MG PO TBDP
4.0000 mg | ORAL_TABLET | Freq: Once | ORAL | Status: AC
  Administered 2021-07-11: 4 mg via ORAL
  Filled 2021-07-11: qty 1

## 2021-07-11 NOTE — ED Triage Notes (Signed)
Pt c/o diarrhea, n/v and mid abdominal pain since Thursday.

## 2021-07-11 NOTE — ED Provider Notes (Signed)
[facility] Provider Note   CSN: [id number] Arrival date & time: 07/11/21  1248     History  Chief Complaint  Patient presents with   Diarrhea    Joyce [name] is a 46 y.o. female.   Diarrhea  Very pleasant 46 year old female with a history of prior stroke, hypertension and some anxiety depression.  She also has high cholesterol.  She presents to the hospital today with nausea vomiting diarrhea.  Daughter has similar symptoms, they are both been sick for approximately 3 days, there has been some waxing and waning nature to this, the diarrhea and vomiting is nonbloody, she has only had watery diarrhea.  Home Medications Prior to Admission medications   Medication Sig Start Date End Date Taking? Authorizing Provider  ondansetron (ZOFRAN) 4 MG tablet Take 1 tablet (4 mg total) by mouth every 6 (six) hours. 07/11/21  Yes Noemi Chapel, MD  ALPRAZolam Duanne Moron) 0.25 MG tablet Take 0.25 mg by mouth daily as needed. 11/17/20   [provider]  amLODipine (NORVASC) 5 MG tablet TAKE 1 TABLET (5 MG TOTAL) BY MOUTH DAILY. 06/07/21 06/07/22  Lindell Spar, MD  aspirin 325 MG EC tablet Take 1 tablet (325 mg total) by mouth daily. 07/20/20   Meredith Staggers, MD  atorvastatin (LIPITOR) 80 MG tablet TAKE 1 TABLET (80 MG TOTAL) BY MOUTH DAILY. 06/07/21 06/07/22  Lindell Spar, MD  buPROPion (WELLBUTRIN XL) 150 MG 24 hr tablet Take 150 mg by mouth every morning. 12/08/20   [provider]  buPROPion (WELLBUTRIN XL) 300 MG 24 hr tablet Take 300 mg by mouth every morning. 02/21/21   [provider]  busPIRone (BUSPAR) 10 MG tablet Take 10 mg by mouth 2 (two) times daily. 02/11/21   [provider]  dicyclomine (BENTYL) 10 MG capsule Take 1 capsule (10 mg total) by mouth 4 (four) times daily -  before meals and at bedtime. Stop if constipated 08/19/20   Annitta Needs, NP  escitalopram (LEXAPRO) 20 MG tablet TAKE 1 TABLET (20 MG TOTAL) BY MOUTH DAILY.  06/07/21 06/07/22  Lindell Spar, MD  fluticasone (FLONASE) 50 MCG/ACT nasal spray Use 2 sprays in each nostril BID for a week. After 1 week, decrease to 1 spray in each nostril BID as needed for congestion/allergies. 10/06/20   Noreene Larsson, NP  gabapentin (NEURONTIN) 600 MG tablet Take 1 tablet (600 mg total) by mouth 3 (three) times daily. 12/02/20   Meredith Staggers, MD  ibuprofen (ADVIL) 600 MG tablet Take 1 tablet (600 mg total) by mouth every 6 (six) hours as needed. 06/07/20   Evalee Jefferson, PA-C  methocarbamol (ROBAXIN) 500 MG tablet Take 1 tablet (500 mg total) by mouth every 8 (eight) hours as needed for muscle spasms. 09/02/20 09/02/21  Meredith Staggers, MD  methocarbamol (ROBAXIN) 750 MG tablet Take 750 mg by mouth at bedtime as needed. 01/18/21   [provider]  pantoprazole (PROTONIX) 40 MG tablet Take 1 tablet (40 mg total) by mouth daily. 12/02/20 12/02/21  Meredith Staggers, MD  PARAGARD INTRAUTERINE COPPER IU by Intrauterine route.    [provider]  predniSONE (DELTASONE) 20 MG tablet Take 1 tablet (20 mg total) by mouth as directed. Take 1 tab 3x daily for 4 days then 1 tab 2x daily for 4 days then 1 tab once daily for 4 days, then 1/2 tab daily for 4 days and off. 06/23/21   Meredith Staggers, MD  topiramate (TOPAMAX) 50 MG tablet Take 1 tablet (50 mg total) by mouth 2 (two) times daily. Start 1 tablet daily x 1 week and then twice daily 06/23/21   Meredith Staggers, MD  traZODone (DESYREL) 100 MG tablet Take 100 mg by mouth at bedtime. 03/31/20   [provider]      Allergies    Patient has no known allergies.    Review of Systems   Review of Systems  Gastrointestinal:  Positive for diarrhea.  All other systems reviewed and are negative.  Physical Exam Updated Vital Signs BP 135/84 (BP Location: Left Arm)    Pulse 85    Temp 97.8 F (36.6 C)    Resp 14    Ht 1.651 m (5\' 5" )    Wt 104.3 kg    LMP 07/10/2021    SpO2 99%    BMI 38.27 kg/m  Physical  Exam Vitals and nursing note reviewed.  Constitutional:      General: She is not in acute distress.    Appearance: She is well-developed.  HENT:     Head: Normocephalic and atraumatic.     Mouth/Throat:     Pharynx: No oropharyngeal exudate.  Eyes:     General: No scleral icterus.       Right eye: No discharge.        Left eye: No discharge.     Conjunctiva/sclera: Conjunctivae normal.     Pupils: Pupils are equal, round, and reactive to light.  Neck:     Thyroid: No thyromegaly.     Vascular: No JVD.  Cardiovascular:     Rate and Rhythm: Normal rate and regular rhythm.     Heart sounds: Normal heart sounds. No murmur heard.   No friction rub. No gallop.  Pulmonary:     Effort: Pulmonary effort is normal. No respiratory distress.     Breath sounds: Normal breath sounds. No wheezing or rales.  Abdominal:     General: Bowel sounds are normal. There is no distension.     Palpations: Abdomen is soft. There is no mass.     Tenderness: There is no abdominal tenderness.  Musculoskeletal:        General: No tenderness. Normal range of motion.     Cervical back: Normal range of motion and neck supple.  Lymphadenopathy:     Cervical: No cervical adenopathy.  Skin:    General: Skin is warm and dry.     Findings: No erythema or rash.  Neurological:     Mental Status: She is alert.     Coordination: Coordination normal.  Psychiatric:        Behavior: Behavior normal.    ED Results / Procedures / Treatments   Labs (all labs ordered are listed, but only abnormal results are displayed) Labs Reviewed  LIPASE, BLOOD  COMPREHENSIVE METABOLIC PANEL  CBC  URINALYSIS, ROUTINE W REFLEX MICROSCOPIC  POC URINE PREG, ED    EKG None  Radiology No results found.  Procedures Procedures    Medications Ordered in ED Medications  ondansetron (ZOFRAN-ODT) disintegrating tablet 4 mg (4 mg Oral Given 07/11/21 1506)    ED Course/ Medical Decision Making/ A&P                            Medical Decision Making Amount and/or Complexity of Data Reviewed Labs: ordered.  Risk Prescription drug management.   Exam is unremarkable, there is no tenderness in  the abdomen, there is normal bowel sounds, there is no tachycardia, she is well-appearing.  She states that she was feeling better today but has had some ongoing nausea vomiting diarrhea.  Sounds viral, no travel, no similar foods, nobody else around him has been sick, the child is in school at 73 years old and the symptoms started 2 days after her last at school.  Recommended Zofran, given in emergency department, stable for discharge with same prescription, patient agreeable        Final Clinical Impression(s) / ED Diagnoses Final diagnoses:  Nausea vomiting and diarrhea    Rx / DC Orders ED Discharge Orders          Ordered    ondansetron (ZOFRAN) 4 MG tablet  Every 6 hours        07/11/21 1506              Noemi Chapel, MD 07/11/21 1507

## 2021-07-11 NOTE — Discharge Instructions (Signed)
°  Zofran every 6 hours as needed for nasuea  Thank you for letting us take care of you today!  Please drink plenty of clear liquids, you may try soup broth, juice, Gatorade, water but try not for any food for2 days.  Please obtain all of your results from medical records or have your doctors office obtain the results - share them with your doctor - you should be seen at your doctors office in the next 2 days. Call today to arrange your follow up. Take the medications as prescribed. Please review all of the medicines and only take them if you do not have an allergy to them. Please be aware that if you are taking birth control pills, taking other prescriptions, ESPECIALLY ANTIBIOTICS may make the birth control ineffective - if this is the case, either do not engage in sexual activity or use alternative methods of birth control such as condoms until you have finished the medicine and your family doctor says it is OK to restart them. If you are on a blood thinner such as COUMADIN, be aware that any other medicine that you take may cause the coumadin to either work too much, or not enough - you should have your coumadin level rechecked in next 7 days if this is the case.  ?  It is also a possibility that you have an allergic reaction to any of the medicines that you have been prescribed - Everybody reacts differently to medications and while MOST people have no trouble with most medicines, you may have a reaction such as nausea, vomiting, rash, swelling, shortness of breath. If this is the case, please stop taking the medicine immediately and contact your physician.   If you were given a medication in the ED such as percocet, vicodin, or morphine, be aware that these medicines are sedating and may change your ability to take care of yourself adequately for several hours after being given this medicines - you should not drive or take care of small children if you were given this medicine in the Emergency  Department or if you have been prescribed these types of medicines. ?   You should return to the ER IMMEDIATELY if you develop severe or worsening symptoms.

## 2021-07-12 ENCOUNTER — Telehealth: Payer: Self-pay

## 2021-07-12 NOTE — Telephone Encounter (Signed)
Transition Care Management Follow-up Telephone Call Date of discharge and from where: 07/11/2021-[facility]  How have you been since you were released from the hospital? Pt stated she is feeling the same. Patient has been advised to contact new PCP to see if she can have an acute follow up.  Any questions or concerns? No  Items Reviewed: Did the pt receive and understand the discharge instructions provided? Yes  Medications obtained and verified? Yes  Other? No  Any new allergies since your discharge? No  Dietary orders reviewed? No Do you have support at home? Yes   Home Care and Equipment/Supplies: Were home health services ordered? not applicable If so, what is the name of the agency? N/A  Has the agency set up a time to come to the patient's home? not applicable Were any new equipment or medical supplies ordered?  No What is the name of the medical supply agency? N/A Were you able to get the supplies/equipment? not applicable Do you have any questions related to the use of the equipment or supplies? No  Functional Questionnaire: (I = Independent and D = Dependent) ADLs: I  Bathing/Dressing- I  Meal Prep- I  Eating- I  Maintaining continence- I  Transferring/Ambulation- I  Managing Meds- I  Follow up appointments reviewed:  PCP Hospital f/u appt confirmed? No   Specialist Hospital f/u appt confirmed? No   Are transportation arrangements needed? No  If their condition worsens, is the pt aware to call PCP or go to the Emergency Dept.? Yes Was the patient provided with contact information for the PCP's office or ED? Yes Was to pt encouraged to call back with questions or concerns? Yes

## 2021-07-15 DIAGNOSIS — F410 Panic disorder [episodic paroxysmal anxiety] without agoraphobia: Secondary | ICD-10-CM | POA: Diagnosis not present

## 2021-07-15 DIAGNOSIS — F331 Major depressive disorder, recurrent, moderate: Secondary | ICD-10-CM | POA: Diagnosis not present

## 2021-07-15 DIAGNOSIS — F411 Generalized anxiety disorder: Secondary | ICD-10-CM | POA: Diagnosis not present

## 2021-07-15 DIAGNOSIS — Z79899 Other long term (current) drug therapy: Secondary | ICD-10-CM | POA: Diagnosis not present

## 2021-09-21 ENCOUNTER — Other Ambulatory Visit: Payer: Self-pay | Admitting: Emergency Medicine

## 2021-10-04 ENCOUNTER — Emergency Department (HOSPITAL_COMMUNITY)
Admission: EM | Admit: 2021-10-04 | Discharge: 2021-10-04 | Disposition: A | Discharge status: Home / Self Care | Payer: Medicaid Other | Attending: Emergency Medicine | Admitting: Emergency Medicine

## 2021-10-04 ENCOUNTER — Other Ambulatory Visit: Payer: Self-pay

## 2021-10-04 ENCOUNTER — Encounter (HOSPITAL_COMMUNITY): Payer: Self-pay | Admitting: Emergency Medicine

## 2021-10-04 DIAGNOSIS — R52 Pain, unspecified: Secondary | ICD-10-CM | POA: Diagnosis not present

## 2021-10-04 DIAGNOSIS — Z79899 Other long term (current) drug therapy: Secondary | ICD-10-CM | POA: Diagnosis not present

## 2021-10-04 DIAGNOSIS — M5441 Lumbago with sciatica, right side: Secondary | ICD-10-CM | POA: Diagnosis not present

## 2021-10-04 DIAGNOSIS — M545 Low back pain, unspecified: Secondary | ICD-10-CM | POA: Diagnosis present

## 2021-10-04 DIAGNOSIS — Z7982 Long term (current) use of aspirin: Secondary | ICD-10-CM | POA: Diagnosis not present

## 2021-10-04 DIAGNOSIS — I10 Essential (primary) hypertension: Secondary | ICD-10-CM | POA: Insufficient documentation

## 2021-10-04 MED ORDER — CYCLOBENZAPRINE HCL 10 MG PO TABS: 10.0000 mg | ORAL_TABLET | Freq: Two times a day (BID) | ORAL | 0 refills | Status: DC | PRN

## 2021-10-04 MED ORDER — PREDNISONE 10 MG PO TABS: 30.0000 mg | ORAL_TABLET | Freq: Every day | ORAL | 0 refills | Status: AC

## 2021-10-04 NOTE — ED Triage Notes (Signed)
Pt having lower back pain radiating down right leg to foot.  History of arthritis, pt took Advil and Neurontin this am, without relief. ?

## 2021-10-04 NOTE — ED Provider Notes (Signed)
?[facility] ?Provider Note ? ? ?CSN: [id number] ?Arrival date & time: 10/04/21  1310 ? ?  ? ?History ? ?Chief Complaint  ?Patient presents with  ? Back Pain  ? ? ?Joyce [name] is a 46 y.o. female. ? ?HPI ? ?With medical history including hypertension, CVA, presents with complaints of right lower back pain.  Patient is having pain for last week time, pain came on suddenly, denies any traumatic injury to the area, states pain is mainly in her right lower back into her butt cheek and will occasionally go down to her right leg, she denies saddle paresthesias urinary/bowel incontinency, states pain is worse with movement improved with rest discharge over-the-counter pain medication with relief.  She denies any chest pain shortness of breath any leg pain leg swelling, denies any urinary symptoms flank tenderness or suprapubic pain. ? ?Reviewed patient's chart recently had MRI in March of lower lumbar region has disc degeneration L2 /L3 without acute abnormalities. ? ?Home Medications ?Prior to Admission medications   ?Medication Sig Start Date End Date Taking? Authorizing Provider  ?cyclobenzaprine (FLEXERIL) 10 MG tablet Take 1 tablet (10 mg total) by mouth 2 (two) times daily as needed for muscle spasms. 10/04/21  Yes Marcello Fennel, PA-C  ?predniSONE (DELTASONE) 10 MG tablet Take 3 tablets (30 mg total) by mouth daily for 5 days. 10/04/21 10/09/21 Yes Marcello Fennel, PA-C  ?ALPRAZolam (XANAX) 0.25 MG tablet Take 0.25 mg by mouth daily as needed. 11/17/20   [provider]  ?amLODipine (NORVASC) 5 MG tablet TAKE 1 TABLET (5 MG TOTAL) BY MOUTH DAILY. 06/07/21 06/07/22  Lindell Spar, MD  ?aspirin 325 MG EC tablet Take 1 tablet (325 mg total) by mouth daily. 07/20/20   Meredith Staggers, MD  ?atorvastatin (LIPITOR) 80 MG tablet TAKE 1 TABLET (80 MG TOTAL) BY MOUTH DAILY. 06/07/21 06/07/22  Lindell Spar, MD  ?buPROPion (WELLBUTRIN XL) 150 MG 24 hr tablet Take 150 mg by mouth every morning.  12/08/20   [provider]  ?buPROPion (WELLBUTRIN XL) 300 MG 24 hr tablet Take 300 mg by mouth every morning. 02/21/21   [provider]  ?busPIRone (BUSPAR) 10 MG tablet Take 10 mg by mouth 2 (two) times daily. 02/11/21   [provider]  ?dicyclomine (BENTYL) 10 MG capsule Take 1 capsule (10 mg total) by mouth 4 (four) times daily -  before meals and at bedtime. Stop if constipated 08/19/20   Annitta Needs, NP  ?escitalopram (LEXAPRO) 20 MG tablet TAKE 1 TABLET (20 MG TOTAL) BY MOUTH DAILY. 06/07/21 06/07/22  Lindell Spar, MD  ?fluticasone (FLONASE) 50 MCG/ACT nasal spray Use 2 sprays in each nostril BID for a week. After 1 week, decrease to 1 spray in each nostril BID as needed for congestion/allergies. 10/06/20   Noreene Larsson, NP  ?gabapentin (NEURONTIN) 600 MG tablet Take 1 tablet (600 mg total) by mouth 3 (three) times daily. 12/02/20   Meredith Staggers, MD  ?ibuprofen (ADVIL) 600 MG tablet Take 1 tablet (600 mg total) by mouth every 6 (six) hours as needed. 06/07/20   Evalee Jefferson, PA-C  ?methocarbamol (ROBAXIN) 750 MG tablet Take 750 mg by mouth at bedtime as needed. 01/18/21   [provider]  ?ondansetron (ZOFRAN) 4 MG tablet Take 1 tablet (4 mg total) by mouth every 6 (six) hours. 07/11/21   Noemi Chapel, MD  ?pantoprazole (PROTONIX) 40 MG tablet Take 1 tablet (40 mg total) by mouth daily.  12/02/20 12/02/21  Meredith Staggers, MD  ?[facility] INTRAUTERINE COPPER IU by Intrauterine route.    [provider]  ?topiramate (TOPAMAX) 50 MG tablet Take 1 tablet (50 mg total) by mouth 2 (two) times daily. Start 1 tablet daily x 1 week and then twice daily 06/23/21   Meredith Staggers, MD  ?traZODone (DESYREL) 100 MG tablet Take 100 mg by mouth at bedtime. 03/31/20   [provider]  ?   ? ?Allergies    ?Patient has no known allergies.   ? ?Review of Systems   ?Review of Systems  ?Constitutional:  Negative for chills and fever.  ?Respiratory:  Negative for shortness of  breath.   ?Cardiovascular:  Negative for chest pain.  ?Gastrointestinal:  Negative for abdominal pain.  ?Musculoskeletal:  Positive for back pain.  ?Neurological:  Negative for headaches.  ? ?Physical Exam ?Updated Vital Signs ?BP (!) 141/78 (BP Location: Right Arm)   Pulse 73   Temp 98.3 ?F (36.8 ?C) (Oral)   Resp 16   Ht '5\' 5"'$  (1.651 m)   Wt 104.3 kg   SpO2 100%   BMI 38.27 kg/m?  ?Physical Exam ?Vitals and nursing note reviewed.  ?Constitutional:   ?   General: She is not in acute distress. ?   Appearance: She is not ill-appearing.  ?HENT:  ?   Head: Normocephalic and atraumatic.  ?   Nose: No congestion.  ?Eyes:  ?   Conjunctiva/sclera: Conjunctivae normal.  ?Cardiovascular:  ?   Rate and Rhythm: Normal rate and regular rhythm.  ?   Pulses: Normal pulses.  ?Pulmonary:  ?   Effort: Pulmonary effort is normal.  ?Abdominal:  ?   Palpations: Abdomen is soft.  ?   Tenderness: There is no abdominal tenderness. There is no right CVA tenderness or left CVA tenderness.  ?   Comments: Abdomen soft nontender to palpation no guarding rebound tenderness or peritoneal sign negative Murphy sign McBurney point no suprapubic tenderness no flank tenderness or side tenderness.  ?Musculoskeletal:  ?   Comments: Spine was palpated was nontender to palpation no step-off deformities noted, there is no overlying skin changes, she had tenderness noted in the muscular over the right iliac crest, as well as buttocks pain she has 5-5 strength neurovascular intact in the lower extremities 2+ radial pulses.  She is able to ambulate.  ?Skin: ?   General: Skin is warm and dry.  ?Neurological:  ?   Mental Status: She is alert.  ?Psychiatric:     ?   Mood and Affect: Mood normal.  ? ? ?ED Results / Procedures / Treatments   ?Labs ?(all labs ordered are listed, but only abnormal results are displayed) ?Labs Reviewed - No data to display ? ?EKG ?None ? ?Radiology ?No results found. ? ?Procedures ?Procedures  ? ? ?Medications Ordered in  ED ?Medications - No data to display ? ?ED Course/ Medical Decision Making/ A&P ?  ?                        ?Medical Decision Making ? ?This patient presents to the ED for concern of back pain, this involves an extensive number of treatment options, and is a complaint that carries with it a high risk of complications and morbidity.  The differential diagnosis includes spinal equina, UTI, fracture, dislocation ? ? ? ?Additional history obtained: ? ?Additional history obtained from N/A ?External records from outside source obtained and reviewed including previous  imaging, previous PCP notes ? ? ?Co morbidities that complicate the patient evaluation ? ?Obesity ? ?Social Determinants of Health: ? ?N/A ? ? ? ?Lab Tests: ? ?I Ordered, and personally interpreted labs.  The pertinent results include: N/A ? ? ?Imaging Studies ordered: ? ?I ordered imaging studies including N/A ?I independently visualized and interpreted imaging which showed N/A ?I agree with the radiologist interpretation ? ? ?Cardiac Monitoring: ? ?The patient was maintained on a cardiac monitor.  I personally viewed and interpreted the cardiac monitored which showed an underlying rhythm of: N/A ? ? ?Medicines ordered and prescription drug management: ? ?I ordered medication including N/A ?I have reviewed the patients home medicines and have made adjustments as needed ? ?Critical Interventions: ? ?N/A ? ? ?Reevaluation: ? ?Presents with back pain, exam seems consistent with sciatica, no significant findings, patient agreeable with steroids muscle relaxer's and discharge. ? ?Consultations Obtained: ? ?N/A ? ? ? ?Test Considered: ? ?CT imaging of lumbar region but will defer as my suspicion for fracture very low at this time, no trauma to the area, nontender during my exam, low risk for pathologic fracture. ? ? ? ?Rule out ?I have low suspicion for spinal fracture or spinal cord abnormality as patient denies urinary incontinency, retention, difficulty with  bowel movements, denies saddle paresthesias.  Spine was palpated there is no step-off, crepitus or gross deformities felt, patient had 5/5 strength, full range of motion, neurovascular fully intact in the lower extremi

## 2021-10-04 NOTE — Discharge Instructions (Signed)
You have been seen here for back pain, I recommend taking over-the-counter pain medications like ibuprofen and/or Tylenol every 6 as needed.  Please follow dosage and on the back of bottle.  I also recommend applying heat to the area and stretching out the muscles as this will help decrease stiffness and pain.  I have given you information on exercises please follow. ? ?Given you steroids and a muscle relaxer take as prescribed ? ?Follow-up with neurosurgery for further evaluation. ? ?Come back to the emergency department if you develop chest pain, shortness of breath, severe abdominal pain, uncontrolled nausea, vomiting, diarrhea. ? ? ? ?

## 2021-10-05 ENCOUNTER — Telehealth: Payer: Self-pay

## 2021-10-05 ENCOUNTER — Encounter: Payer: Self-pay | Admitting: Internal Medicine

## 2021-10-05 NOTE — Telephone Encounter (Signed)
Transition Care Management Follow-up Telephone Call ?Date of discharge and from where: 10/04/2021 from [facility] ?How have you been since you were released from the hospital? Patient stated that she is still feeling the same. Patient is aware of upcoming appts and will follow up with her providers to evaluate further. Patient did not have any questions or concerns at this time.  ?Any questions or concerns? No ? ?Items Reviewed: ?Did the pt receive and understand the discharge instructions provided? Yes  ?Medications obtained and verified? Yes  ?Other? No  ?Any new allergies since your discharge? No  ?Dietary orders reviewed? No ?Do you have support at home? Yes  ? ?Functional Questionnaire: (I = Independent and D = Dependent) ?ADLs: I ? ?Bathing/Dressing- I ? ?Meal Prep- I ? ?Eating- I ? ?Maintaining continence- I ? ?Transferring/Ambulation- I ? ?Managing Meds- I ? ? ?Follow up appointments reviewed: ? ?PCP Hospital f/u appt confirmed? Yes  Scheduled to see Halina Maidens, MD on 10/06/2021 @ 3:00pm. ?[facility] f/u appt confirmed? Yes  Scheduled to see Alger Simons, MD on 10/13/2021 @ 2:20pm. ?Are transportation arrangements needed? No  ?If their condition worsens, is the pt aware to call PCP or go to the Emergency Dept.? Yes ?Was the patient provided with contact information for the PCP's office or ED? Yes ?Was to pt encouraged to call back with questions or concerns? Yes ? ?

## 2021-10-06 ENCOUNTER — Encounter: Payer: Self-pay | Admitting: Internal Medicine

## 2021-10-06 ENCOUNTER — Ambulatory Visit: Payer: Medicaid Other | Admitting: Internal Medicine

## 2021-10-06 VITALS — BP 134/82 | HR 83 | Ht 65.0 in | Wt 245.0 lb

## 2021-10-06 DIAGNOSIS — G8194 Hemiplegia, unspecified affecting left nondominant side: Secondary | ICD-10-CM

## 2021-10-06 DIAGNOSIS — M792 Neuralgia and neuritis, unspecified: Secondary | ICD-10-CM | POA: Diagnosis not present

## 2021-10-06 DIAGNOSIS — G43409 Hemiplegic migraine, not intractable, without status migrainosus: Secondary | ICD-10-CM

## 2021-10-06 DIAGNOSIS — I10 Essential (primary) hypertension: Secondary | ICD-10-CM | POA: Diagnosis not present

## 2021-10-06 DIAGNOSIS — E785 Hyperlipidemia, unspecified: Secondary | ICD-10-CM | POA: Diagnosis not present

## 2021-10-06 DIAGNOSIS — F331 Major depressive disorder, recurrent, moderate: Secondary | ICD-10-CM

## 2021-10-06 DIAGNOSIS — M5416 Radiculopathy, lumbar region: Secondary | ICD-10-CM

## 2021-10-06 DIAGNOSIS — D251 Intramural leiomyoma of uterus: Secondary | ICD-10-CM | POA: Diagnosis not present

## 2021-10-06 NOTE — Progress Notes (Signed)
? ? ?Date:  10/06/2021  ? ?Name:  Joyce [name]   DOB:  05-Jun-1975   MRN:  [id number] ? ? ?Chief Complaint: New Patient (Initial Visit) and Leg Pain (Right Leg Pain - starts in lower buttocks and radiates down into leg. ) ? ?Leg Pain  ?The incident occurred 5 to 7 days ago. There was no injury mechanism. Pain location: right buttock to right knee. The quality of the pain is described as burning and shooting. The pain is moderate. The pain has been Improving (gradually) since onset. Treatments tried: given steroid taper and muscle relaxants by UC.  ?Hypertension ?This is a chronic problem. The problem is controlled. Associated symptoms include headaches. Pertinent negatives include no chest pain, palpitations or shortness of breath. Past treatments include calcium channel blockers. Hypertensive end-organ damage includes CVA (in 2021).  ?Hyperlipidemia ?This is a chronic problem. The problem is controlled. Associated symptoms include leg pain and myalgias. Pertinent negatives include no chest pain or shortness of breath. Current antihyperlipidemic treatment includes statins.  ?Migraine  ?This is a recurrent problem. The problem occurs intermittently. The pain quality is similar to prior headaches. Associated symptoms include back pain. Pertinent negatives include no abdominal pain, coughing, dizziness or fever. Treatments tried: Topamax bid for prevention; Advil prn. The treatment provided significant relief. Her past medical history is significant for hypertension.  ?Depression ?       This is a chronic (treated by Psych) problem.The problem is unchanged.  Associated symptoms include myalgias and headaches.  Associated symptoms include no fatigue.  Past treatments include SSRIs - Selective serotonin reuptake inhibitors (Lexapro, Bupropion and Buspar). ? ?Lab Results  ?Component Value Date  ? NA 134 (L) 07/11/2021  ? K 3.4 (L) 07/11/2021  ? CO2 25 07/11/2021  ? GLUCOSE 87 07/11/2021  ? BUN 10 07/11/2021  ?  CREATININE 0.76 07/11/2021  ? CALCIUM 8.4 (L) 07/11/2021  ? EGFR 111 11/20/2020  ? GFRNONAA >60 07/11/2021  ? ?Lab Results  ?Component Value Date  ? CHOL 125 11/20/2020  ? HDL 43 11/20/2020  ? [place] 70 11/20/2020  ? TRIG 56 11/20/2020  ? CHOLHDL 2.7 08/06/2020  ? ?Lab Results  ?Component Value Date  ? TSH 0.523 03/06/2020  ? ?Lab Results  ?Component Value Date  ? HGBA1C 5.6 03/05/2020  ? ?Lab Results  ?Component Value Date  ? WBC 8.3 07/11/2021  ? HGB 10.8 (L) 07/11/2021  ? HCT 35.5 (L) 07/11/2021  ? MCV 68.8 (L) 07/11/2021  ? PLT 323 07/11/2021  ? ?Lab Results  ?Component Value Date  ? ALT 19 07/11/2021  ? AST 18 07/11/2021  ? ALKPHOS 45 07/11/2021  ? BILITOT 0.2 (L) 07/11/2021  ? ?Lab Results  ?Component Value Date  ? VD25OH 33 02/15/2017  ?  ? ?Review of Systems  ?Constitutional:  Negative for chills, fatigue and fever.  ?Respiratory:  Negative for cough, chest tightness and shortness of breath.   ?Cardiovascular:  Negative for chest pain, palpitations and leg swelling.  ?Gastrointestinal:  Negative for abdominal pain, constipation and diarrhea.  ?Musculoskeletal:  Positive for arthralgias, back pain, gait problem and myalgias.  ?Neurological:  Positive for headaches. Negative for dizziness and light-headedness.  ?Psychiatric/Behavioral:  Positive for depression, dysphoric mood and sleep disturbance. The patient is nervous/anxious.   ? ?Patient Active Problem List  ? Diagnosis Date Noted  ? Lumbar radiculitis 06/23/2021  ? Left lateral epicondylitis 06/23/2021  ? Chronic left-sided low back pain without sciatica 07/01/2020  ? Neuropathic pain 05/06/2020  ?  Anxiety state   ? Dyslipidemia   ? Migraine without status migrainosus, not intractable   ? History of stroke 03/10/2020  ? Left hemiparesis ([place]) 03/10/2020  ? Recurrent falls 03/05/2020  ? MDD (major depressive disorder), recurrent episode, moderate ([place]) 05/01/2017  ? PTSD (post-traumatic stress disorder) 05/01/2017  ? Essential hypertension 07/20/2016  ?  Uterine fibroid 01/27/2016  ? Cervical high risk HPV (human papillomavirus) test positive 12/08/2014  ? ? ?No Known Allergies ? ?Past Surgical History:  ?Procedure Laterality Date  ? CHOLECYSTECTOMY    ? ? ?Social History  ? ?Tobacco Use  ? Smoking status: Never  ? Smokeless tobacco: Never  ?Vaping Use  ? Vaping Use: Never used  ?Substance Use Topics  ? Alcohol use: Never  ? Drug use: No  ? ? ? ?Medication list has been reviewed and updated. ? ?Current Meds  ?Medication Sig  ? ALPRAZolam (XANAX) 0.25 MG tablet Take 0.25 mg by mouth daily as needed.  ? amLODipine (NORVASC) 5 MG tablet TAKE 1 TABLET (5 MG TOTAL) BY MOUTH DAILY.  ? aspirin 325 MG EC tablet Take 1 tablet (325 mg total) by mouth daily.  ? atorvastatin (LIPITOR) 80 MG tablet TAKE 1 TABLET (80 MG TOTAL) BY MOUTH DAILY.  ? buPROPion (WELLBUTRIN XL) 300 MG 24 hr tablet Take 300 mg by mouth every morning.  ? busPIRone (BUSPAR) 10 MG tablet Take 10 mg by mouth 2 (two) times daily.  ? cyclobenzaprine (FLEXERIL) 10 MG tablet Take 1 tablet (10 mg total) by mouth 2 (two) times daily as needed for muscle spasms.  ? escitalopram (LEXAPRO) 20 MG tablet TAKE 1 TABLET (20 MG TOTAL) BY MOUTH DAILY.  ? fluticasone (FLONASE) 50 MCG/ACT nasal spray Use 2 sprays in each nostril BID for a week. After 1 week, decrease to 1 spray in each nostril BID as needed for congestion/allergies.  ? gabapentin (NEURONTIN) 600 MG tablet Take 1 tablet (600 mg total) by mouth 3 (three) times daily.  ? ibuprofen (ADVIL) 600 MG tablet Take 1 tablet (600 mg total) by mouth every 6 (six) hours as needed.  ? methocarbamol (ROBAXIN) 750 MG tablet Take 750 mg by mouth at bedtime as needed.  ? pantoprazole (PROTONIX) 40 MG tablet Take 1 tablet (40 mg total) by mouth daily.  ? PARAGARD INTRAUTERINE COPPER IU by Intrauterine route.  ? predniSONE (DELTASONE) 10 MG tablet Take 3 tablets (30 mg total) by mouth daily for 5 days.  ? topiramate (TOPAMAX) 50 MG tablet Take 1 tablet (50 mg total) by mouth 2  (two) times daily. Start 1 tablet daily x 1 week and then twice daily  ? traZODone (DESYREL) 100 MG tablet Take 100 mg by mouth at bedtime.  ? ? ? ?  10/06/2021  ?  2:39 PM  ?GAD 7 : Generalized Anxiety Score  ?Nervous, Anxious, on Edge 3  ?Control/stop worrying 3  ?Worry too much - different things 3  ?Trouble relaxing 3  ?Restless 2  ?Easily annoyed or irritable 3  ?Afraid - awful might happen 1  ?Total GAD 7 Score 18  ?Anxiety Difficulty Extremely difficult  ? ? ? ?  10/06/2021  ?  2:39 PM  ?Depression screen PHQ 2/9  ?Decreased Interest 3  ?Down, Depressed, Hopeless 3  ?PHQ - 2 Score 6  ?Altered sleeping 3  ?Tired, decreased energy 2  ?Change in appetite 0  ?Feeling bad or failure about yourself  2  ?Trouble concentrating 3  ?Moving slowly or fidgety/restless 2  ?Suicidal  thoughts 0  ?PHQ-9 Score 18  ?Difficult doing work/chores Very difficult  ? ? ?BP Readings from Last 3 Encounters:  ?10/06/21 134/82  ?10/04/21 (!) 148/84  ?07/11/21 138/74  ? ? ?Physical Exam ?Vitals and nursing note reviewed.  ?Constitutional:   ?   General: She is not in acute distress. ?   Appearance: Normal appearance. She is well-developed. She is not ill-appearing.  ?HENT:  ?   Head: Normocephalic and atraumatic.  ?Neck:  ?   Vascular: No carotid bruit.  ?Cardiovascular:  ?   Rate and Rhythm: Normal rate and regular rhythm.  ?   Pulses: Normal pulses.  ?   Heart sounds: No murmur heard. ?Pulmonary:  ?   Effort: Pulmonary effort is normal. No respiratory distress.  ?   Breath sounds: No wheezing or rhonchi.  ?Musculoskeletal:  ?   Right shoulder: Normal.  ?   Left shoulder: No bony tenderness. Normal range of motion.  ?   Cervical back: Normal range of motion.  ?   Lumbar back: Spasms and tenderness present. Positive right straight leg raise test.  ?   Right lower leg: No edema.  ?   Left lower leg: No edema.  ?   Comments: Tremor of LUE with exertion  ?Lymphadenopathy:  ?   Cervical: No cervical adenopathy.  ?Skin: ?   General: Skin is  warm and dry.  ?   Findings: No rash.  ?Neurological:  ?   Mental Status: She is alert and oriented to person, place, and time.  ?   Motor: Weakness and tremor present. No atrophy or abnormal muscle tone.  ?   Co

## 2021-10-07 DIAGNOSIS — F331 Major depressive disorder, recurrent, moderate: Secondary | ICD-10-CM | POA: Diagnosis not present

## 2021-10-07 DIAGNOSIS — F411 Generalized anxiety disorder: Secondary | ICD-10-CM | POA: Diagnosis not present

## 2021-10-07 DIAGNOSIS — F410 Panic disorder [episodic paroxysmal anxiety] without agoraphobia: Secondary | ICD-10-CM | POA: Diagnosis not present

## 2021-10-13 ENCOUNTER — Encounter: Payer: Medicaid Other | Attending: Physical Medicine & Rehabilitation | Admitting: Physical Medicine & Rehabilitation

## 2021-10-13 ENCOUNTER — Encounter: Payer: Self-pay | Admitting: Physical Medicine & Rehabilitation

## 2021-10-13 VITALS — BP 138/84 | HR 108 | Ht 65.0 in | Wt 239.0 lb

## 2021-10-13 DIAGNOSIS — M76899 Other specified enthesopathies of unspecified lower limb, excluding foot: Secondary | ICD-10-CM | POA: Insufficient documentation

## 2021-10-13 DIAGNOSIS — G8194 Hemiplegia, unspecified affecting left nondominant side: Secondary | ICD-10-CM | POA: Diagnosis not present

## 2021-10-13 DIAGNOSIS — M792 Neuralgia and neuritis, unspecified: Secondary | ICD-10-CM | POA: Diagnosis not present

## 2021-10-13 MED ORDER — TIZANIDINE HCL 2 MG PO TABS: 2.0000 mg | ORAL_TABLET | Freq: Three times a day (TID) | ORAL | 3 refills | Status: DC

## 2021-10-13 MED ORDER — PREDNISONE 20 MG PO TABS: 20.0000 mg | ORAL_TABLET | Freq: Every day | ORAL | 0 refills | Status: DC

## 2021-10-13 NOTE — Progress Notes (Signed)
? ?Subjective:  ? ? Patient ID: Joyce [name], female    DOB: Jan 31, 1976, 46 y.o.   MRN: [id number] ? ?HPI ? ?She continues right hip pain which is worst when she sits down or lays down. She denies back pain.  She reports improvement in left leg pain.  Her left elbow seems to be improved to stable.  Lumbar MRI was done in February which revealed the following:  ? ?L2-L3: Mild disc desiccation and disc space loss. Small to moderate ?left paracentral disc extrusion (series 7, image 10 and series 13, ?image 16). Mild to moderate left lateral recess stenosis (descending ?left L3 nerve level). Borderline to mild spinal stenosis. No ?foraminal involvement or stenosis. ?  ?L3-L4:  Negative. ?  ?L4-L5: Negative disc. Moderate facet hypertrophy. Trace degenerative ?facet joint fluid. No stenosis. ?  ?L5-S1: Negative disc. Moderate facet hypertrophy. Mild endplate ?spurring. No significant stenosis. ? ? ?She received some oral steroids and muscle relaxant which may have provided some relief. She uses a Production assistant, radio which may calm it down somewhat.  ? ?She walks with and without a cane.  Sometimes walking helps get her mind off of her pain.  She does tend to favor the right leg with gait now.  She has used some Robaxin for spasms which really helps her fall asleep at night if nothing else.  She has taken some short courses of steroids and has used Advil on occasion also.  Currently she is on only aspirin alone for her secondary stroke prevention. ? ? ?Pain Inventory ?Average Pain 10 ?Pain Right Now 10 ?My pain is stabbing ? ?In the last 24 hours, has pain interfered with the following? ?General activity 10 ?Relation with others 10 ?Enjoyment of life 10 ?What TIME of day is your pain at its worst? night ?Sleep (in general) Poor ? ?Pain is worse with: walking, bending, sitting, standing, and some activites ?Pain improves with:  none ?Relief from Meds:  none ? ?Family History  ?Problem Relation Age of Onset  ? Diabetes Mother   ?  Hypertension Mother   ? Kidney disease Mother   ? Asthma Mother   ? Hyperlipidemia Mother   ? Alzheimer's disease Father   ? Diabetes Father   ? Kidney disease Sister   ? Asthma Son   ? Diabetes Sister   ? Asthma Son   ? ADD / ADHD Son   ? Heart murmur Son   ? Asthma Son   ? Colon cancer Neg Hx   ? Colon polyps Neg Hx   ? ?Social History  ? ?Socioeconomic History  ? Marital status: Significant Other  ?  Spouse name: Fransisco Beau  ? Number of children: 8  ? Years of education: 74  ? Highest education level: Not on file  ?Occupational History  ? Occupation: unemployed  ?  Comment: disabled  ?Tobacco Use  ? Smoking status: Never  ? Smokeless tobacco: Never  ?Vaping Use  ? Vaping Use: Never used  ?Substance and Sexual Activity  ? Alcohol use: Never  ? Drug use: No  ? Sexual activity: Yes  ?  Birth control/protection: I.U.D.  ?  Comment: followed by GYN  ?Other Topics Concern  ? Not on file  ?Social History Narrative  ? Lives with fiance and kids  ? Right Handed  ? Drinks >12 cans of soda in caffeine  ? ?Social Determinants of Health  ? ?Financial Resource Strain: Low Risk   ? Difficulty of Paying Living Expenses: Not hard  at all  ?Food Insecurity: No Food Insecurity  ? Worried About Charity fundraiser in the Last Year: Never true  ? Ran Out of Food in the Last Year: Never true  ?Transportation Needs: No Transportation Needs  ? Lack of Transportation (Medical): No  ? Lack of Transportation (Non-Medical): No  ?Physical Activity: Not on file  ?Stress: Not on file  ?Social Connections: Not on file  ? ?Past Surgical History:  ?Procedure Laterality Date  ? CHOLECYSTECTOMY    ? ?Past Surgical History:  ?Procedure Laterality Date  ? CHOLECYSTECTOMY    ? ?Past Medical History:  ?Diagnosis Date  ? Acute CVA (cerebrovascular accident) ([place]) 03/04/2020  ? Acute left-sided weakness 03/05/2020  ? Anemia   ? Depression   ? [place] multiparity 11/26/2014  ? Hypertension   ? Lumbar radiculitis 06/23/2021  ? Migraine   ? PTSD  (post-traumatic stress disorder) 05/01/2017  ? Round ligament pain 11/12/2014  ? ?BP 138/84   Pulse (!) 108   Ht '5\' 5"'$  (1.651 m)   Wt 239 lb (108.4 kg)   SpO2 99%   BMI 39.77 kg/m?  ? ?Opioid Risk Score:   ?Fall Risk Score:  `1 ? ?Depression screen PHQ 2/9 ? ? ?  10/13/2021  ?  2:06 PM 10/06/2021  ?  2:39 PM 06/23/2021  ?  2:52 PM 04/20/2021  ?  3:34 PM 11/23/2020  ?  9:37 AM 10/21/2020  ? 10:10 AM 10/06/2020  ? 11:17 AM  ?Depression screen PHQ 2/9  ?Decreased Interest '2 3 2 '$ 0 0 0 0  ?Down, Depressed, Hopeless '2 3 2 '$ 0 0 0 1  ?PHQ - 2 Score '4 6 4 '$ 0 0 0 1  ?Altered sleeping  3 2 0     ?Tired, decreased energy  2  0     ?Change in appetite  0  0     ?Feeling bad or failure about yourself   2  0     ?Trouble concentrating  3  0     ?Moving slowly or fidgety/restless  2  0     ?Suicidal thoughts  0  0     ?PHQ-9 Score  18 6 0     ?Difficult doing work/chores  Very difficult  Not difficult at all     ?  ? ? ?Review of Systems  ?Musculoskeletal:   ?     Left hip, buttocks, thigh, knee pain  ?All other systems reviewed and are negative. ? ?   ?Objective:  ? Physical Exam ?General: No acute distress, obese ?HEENT: NCAT, EOMI, oral membranes moist ?Cards: reg rate  ?Chest: normal effort ?Abdomen: Soft, NT, ND ?Skin: dry, intact ?Extremities: no edema ?Psych: pleasant and appropriate  ?Neuro Alert and oriented x 3. Normal insight and awareness. Intact Memory. Normal language and speech. Cranial nerve exam unremarkable Sensory 1/2 LUE and LLE. Motor 4/5 on left 5/5 on right.  No significant changes here.  She does walk with reasonable balance and weight shift.  ?Musculoskeletal: Left hip really minimally tender today.  Right buttock notable for tenderness at the ischium and lower buttocks.  Pains worse with forward flexion as well as straight leg raising on the right side.  Pain seems to radiate down the back of the right leg and perhaps little bit laterally as well.  Mild pain at the right knee also. ?  ?  ?  ?  ?Assessment &  Plan:  ?1.  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            -Secondary stroke prophylaxis with  aspirin 325 mg daily ?            -HEP ?            -  ?2.  Pain management/history of migraines ?              topiramate '50mg'$  bid ?            -Gabapentin for neuropathic pain 300 mg 3 times daily ?                        -Increased to '600mg'$  tid ?                        -working better now that she's taking scheduled. ?3.  Mood:  Per primary, lexapro, xanax ?4.  Spasticity: controlled Baclofen 5 mg 3 times daily----DC ?5. GERD- protonix ?                        -stop nsaids other than ECASA ?                        -GI f/u as directed ?6. Low back pain-patient has spondylosis and facet arthropathy on her most recent x-ray from last year.  MRI really unremarkable. Exam more c/w proximal hamstring tendinonapthy likely d/t gait mechanics, stroke ?-refer to PT ?-hand out/info provided ?-tizanidine trial ?-prednisone taper ?7.  Left elbow pain consistent with lateral epicondylitis ?            -Ice, elbow band/splint ?            -seems better.  ?  ?30 minutes of face to face patient care time were spent during this visit. All questions were encouraged and answered.  Follow up with me in 2 months s . ? ? ? ? ?    ? ? ?

## 2021-10-13 NOTE — Patient Instructions (Signed)
PLEASE FEEL FREE TO CALL OUR OFFICE WITH ANY PROBLEMS OR QUESTIONS ([phone])      

## 2021-11-15 ENCOUNTER — Encounter (HOSPITAL_COMMUNITY): Payer: Self-pay | Admitting: Physical Therapy

## 2021-11-15 ENCOUNTER — Ambulatory Visit (HOSPITAL_COMMUNITY): Payer: Medicaid Other | Attending: Physical Medicine & Rehabilitation | Admitting: Physical Therapy

## 2021-11-15 DIAGNOSIS — R2689 Other abnormalities of gait and mobility: Secondary | ICD-10-CM

## 2021-11-15 DIAGNOSIS — G8194 Hemiplegia, unspecified affecting left nondominant side: Secondary | ICD-10-CM | POA: Insufficient documentation

## 2021-11-15 DIAGNOSIS — R29818 Other symptoms and signs involving the nervous system: Secondary | ICD-10-CM

## 2021-11-15 DIAGNOSIS — M76899 Other specified enthesopathies of unspecified lower limb, excluding foot: Secondary | ICD-10-CM | POA: Insufficient documentation

## 2021-11-15 DIAGNOSIS — M25551 Pain in right hip: Secondary | ICD-10-CM

## 2021-11-15 DIAGNOSIS — M792 Neuralgia and neuritis, unspecified: Secondary | ICD-10-CM | POA: Diagnosis not present

## 2021-11-15 DIAGNOSIS — M6281 Muscle weakness (generalized): Secondary | ICD-10-CM

## 2021-11-15 NOTE — Therapy (Signed)
OUTPATIENT PHYSICAL THERAPY LOWER EXTREMITY EVALUATION   Patient Name: Joyce [name] MRN: [id number] DOB:09-Jul-1975, 46 y.o., female Today's Date: 11/15/2021   PT End of Session - 11/15/21 0835     Visit Number 1    Number of Visits 12    Date for PT Re-Evaluation 12/27/21    Authorization Type Medicaid [employer]    Authorization Time Period 12 visits requested - check auth    PT Start Time [phone]    PT Stop Time 0908    PT Time Calculation (min) 34 min    Activity Tolerance Patient tolerated treatment well    Behavior During Therapy [facility] for tasks assessed/performed             Past Medical History:  Diagnosis Date   Acute CVA (cerebrovascular accident) ([place]) 03/04/2020   Acute left-sided weakness 03/05/2020   Anemia    Depression    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   Hypertension    Lumbar radiculitis 06/23/2021   Migraine    PTSD (post-traumatic stress disorder) 05/01/2017   Round ligament pain 11/12/2014   Past Surgical History:  Procedure Laterality Date   CHOLECYSTECTOMY     Patient Active Problem List   Diagnosis Date Noted   Hamstring tendinitis at origin 10/13/2021   Lumbar radiculitis 06/23/2021   Left lateral epicondylitis 06/23/2021   Chronic left-sided low back pain without sciatica 07/01/2020   Neuropathic pain 05/06/2020   Anxiety state    Dyslipidemia    Migraine without status migrainosus, not intractable    History of stroke 03/10/2020   Left hemiparesis ([place]) 03/10/2020   Recurrent falls 03/05/2020   MDD (major depressive disorder), recurrent episode, moderate ([place]) 05/01/2017   PTSD (post-traumatic stress disorder) 05/01/2017   Essential hypertension 07/20/2016   Uterine fibroid 01/27/2016   Cervical high risk HPV (human papillomavirus) test positive 12/08/2014    PCP: Halina Maidens MD  REFERRING PROVIDER: Meredith Staggers, MD   REFERRING DIAG: G81.94 (ICD-10-CM) - Left hemiparesis ([place]) M79.2 (ICD-10-CM) - Neuropathic pain  M76.899 (ICD-10-CM) - Hamstring tendinitis at origin   THERAPY DIAG:  Other abnormalities of gait and mobility  Muscle weakness (generalized)  Other symptoms and signs involving the nervous system  Pain in right hip  Rationale for Evaluation and Treatment Rehabilitation  ONSET DATE: December 2022  SUBJECTIVE:   SUBJECTIVE STATEMENT: Patient states pain in R buttock and hamstring area that goes into R leg/knee. Patient states symptoms increase with movement and laying down. She has trouble sleeping from it. Sharp pains with resting. Laying in bed all day doesn't bother it. Symptoms began with insidious onset about 6-7 months ago.   PERTINENT HISTORY: HTN, Migraine, PTSD, CVA 03/04/20   PAIN:  Are you having pain? Yes: NPRS scale: 6/10 Pain location: R knee Pain description: tingling Aggravating factors: movement Relieving factors: laying in bed  PRECAUTIONS: None  WEIGHT BEARING RESTRICTIONS No  FALLS:  Has patient fallen in last 6 months? No  LIVING ENVIRONMENT: Lives with: lives with their family Lives in: House/apartment Stairs: Yes: Internal: flight steps; on right going up, on left going up, and can reach both and External: 3 steps; on right going up and on left going up Has following equipment at home: Walker - 4 wheeled  OCCUPATION: not employed  PLOF: [place] feel better   OBJECTIVE:   PATIENT SURVEYS:  LEFS 17/80  COGNITION:  Overall cognitive status: Within functional limits for tasks assessed     SENSATION: [facility]  POSTURE: rounded shoulders and forward head  PALPATION: TTP R glute max, glute med/min, proximal hamstring; no tenderness throughout rest of leg  LOWER EXTREMITY ROM:  decreased hip extension bilaterally, pain with TKE on R in R knee and hamstring  Active ROM Right eval Left eval  Hip flexion    Hip extension    Hip abduction    Hip adduction    Hip internal rotation    Hip external rotation    Knee  flexion    Knee extension    Ankle dorsiflexion    Ankle plantarflexion    Ankle inversion    Ankle eversion     (Blank rows = not tested)  LOWER EXTREMITY MMT:  MMT Right eval Left eval  Hip flexion 4/5 4/5  Hip extension 2+/5 2+/5  Hip abduction 4/5 4-/5  Hip adduction    Hip internal rotation    Hip external rotation    Knee flexion 4/5 4+/5  Knee extension 4/5 4+/5  Ankle dorsiflexion 4+/5 4+/5  Ankle plantarflexion    Ankle inversion    Ankle eversion     (Blank rows = not tested)   FUNCTIONAL TESTS:  5 times sit to stand: 22.28 seconds without UE use 2 minute walk test: 195 feet  GAIT: Distance walked: 195 feet Assistive device utilized: Walker - 4 wheeled Level of assistance: Modified independence Comments: 2MWT, antalgic on RLE with increased UE support used on R stance    TODAY'S TREATMENT: 11/15/21 Supine active hamstring stretch 10x 5 second holds Supine hamstring isometric 10 x 10 second holds   PATIENT EDUCATION:  Education details: Patient educated on exam findings, POC, scope of PT, HEP. Person educated: Patient Education method: Explanation, Demonstration, and Handouts Education comprehension: verbalized understanding, returned demonstration, verbal cues required, and tactile cues required   HOME EXERCISE PROGRAM: 6/19/23Access Code: [email or web address] - Supine Hamstring Stretch (Mirrored)  - 2-3 x daily - 7 x weekly - 10 reps - 5-10 second hold - Supine Isometric Hamstring Set  - 2-3 x daily - 7 x weekly - 10 reps - 10 second hold  ASSESSMENT:  CLINICAL IMPRESSION: Patient a 46 y.o. y.o. female who was seen today for physical therapy evaluation and treatment for L hemiparesis, neuropathic pain, and hamstring tendinitis at origin. Patient presents with pain limited deficits in R hip and LE  strength, ROM, endurance, activity tolerance, and functional mobility with ADL. Patient is having to modify and restrict ADL as indicated by outcome measure score  as well as subjective information and objective measures which is affecting overall participation. Patient will benefit from skilled physical therapy in order to improve function and reduce impairment.   OBJECTIVE IMPAIRMENTS Abnormal gait, decreased balance, decreased endurance, decreased mobility, difficulty walking, decreased ROM, decreased strength, increased muscle spasms, improper body mechanics, and pain.   ACTIVITY LIMITATIONS lifting, bending, standing, squatting, sleeping, stairs, transfers, bed mobility, toileting, locomotion level, and caring for others  PARTICIPATION LIMITATIONS: cleaning, laundry, shopping, community activity, and yard work  [place], Past/current experiences, Time since onset of injury/illness/exacerbation, and 3+ comorbidities: HTN, Migraine, PTSD, Acute CVA 03/04/20   are also affecting patient's functional outcome.   REHAB POTENTIAL: Good  CLINICAL DECISION MAKING: Stable/uncomplicated  EVALUATION COMPLEXITY: Low   GOALS: Goals reviewed with patient? Yes  SHORT TERM GOALS: Target date: 12/06/2021  Patient will be independent with HEP in order to improve functional outcomes. Baseline:  Goal status: INITIAL  2.  Patient will report at least 25% improvement in symptoms  for improved quality of life. Baseline:  Goal status: INITIAL    LONG TERM GOALS: Target date: 12/27/2021  Patient will report at least 75% improvement in symptoms for improved quality of life. Baseline:  Goal status: INITIAL  2.  Patient will be able to complete 5x STS in under 14 seconds in order to reduce the risk of falls. Baseline:  Goal status: INITIAL  3.  Patient will be able to ambulate at least 250 feet in 2MWT in order to demonstrate improved gait speed for community ambulation.  Baseline: 195 feet Goal status: INITIAL  4.  Patient will improve LEFS score by at least 9 points in order to indicate improved tolerance to activity. Baseline: 17/80 Goal  status: INITIAL  5.  Patient will demonstrate grade of 4+/5 MMT grade in all tested musculature as evidence of improved strength to assist with stair ambulation and gait.   Baseline: see MMT Goal status: INITIAL     PLAN: PT FREQUENCY: 2x/week  PT DURATION: 6 weeks  PLANNED INTERVENTIONS: Therapeutic exercises, Therapeutic activity, Neuromuscular re-education, Balance training, Gait training, Patient/Family education, Joint manipulation, Joint mobilization, Stair training, Orthotic/Fit training, DME instructions, Aquatic Therapy, Dry Needling, Electrical stimulation, Spinal manipulation, Spinal mobilization, Cryotherapy, Moist heat, Compression bandaging, scar mobilization, Splintting, Taping, Traction, Ultrasound, Ionotophoresis '4mg'$ /ml Dexamethasone, and Manual therapy  PLAN FOR NEXT SESSION: f/u with HEP, hamstring length, hip strength, balance training functional strength, progress as able   Mearl Latin, PT 11/15/2021, 9:17 AM

## 2021-11-17 ENCOUNTER — Ambulatory Visit (HOSPITAL_COMMUNITY): Payer: Medicaid Other | Attending: Physical Medicine & Rehabilitation

## 2021-11-17 ENCOUNTER — Encounter (HOSPITAL_COMMUNITY): Payer: Self-pay

## 2021-11-17 DIAGNOSIS — M25551 Pain in right hip: Secondary | ICD-10-CM | POA: Diagnosis not present

## 2021-11-17 DIAGNOSIS — R2689 Other abnormalities of gait and mobility: Secondary | ICD-10-CM | POA: Insufficient documentation

## 2021-11-17 DIAGNOSIS — M6281 Muscle weakness (generalized): Secondary | ICD-10-CM | POA: Diagnosis not present

## 2021-11-17 DIAGNOSIS — R29818 Other symptoms and signs involving the nervous system: Secondary | ICD-10-CM | POA: Diagnosis not present

## 2021-11-17 NOTE — Therapy (Signed)
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   Hypertension    Lumbar radiculitis 06/23/2021   Migraine    PTSD (post-traumatic stress disorder) 05/01/2017   Round ligament pain 11/12/2014   Past Surgical History:  Procedure Laterality Date   CHOLECYSTECTOMY     Patient Active Problem List   Diagnosis Date Noted   Hamstring tendinitis at origin 10/13/2021   Lumbar radiculitis 06/23/2021   Left lateral epicondylitis 06/23/2021   Chronic left-sided low back pain without sciatica 07/01/2020   Neuropathic pain 05/06/2020   Anxiety state    Dyslipidemia    Migraine without status migrainosus, not intractable    History of stroke 03/10/2020   Left hemiparesis ([place]) 03/10/2020   Recurrent falls 03/05/2020   MDD (major depressive disorder), recurrent episode, moderate ([place]) 05/01/2017   PTSD (post-traumatic stress disorder) 05/01/2017   Essential hypertension 07/20/2016   Uterine fibroid 01/27/2016   Cervical high risk HPV (human papillomavirus) test positive 12/08/2014    PCP: Halina Maidens MD  REFERRING PROVIDER: Meredith Staggers, MD   REFERRING DIAG: G81.94 (ICD-10-CM) - Left hemiparesis ([place]) M79.2 (ICD-10-CM) - Neuropathic pain M76.899 (ICD-10-CM) - Hamstring tendinitis at origin   THERAPY DIAG:  Other abnormalities of gait and mobility  Muscle weakness (generalized)  Other symptoms and signs involving the nervous  system  Pain in right hip  Rationale for Evaluation and Treatment Rehabilitation  ONSET DATE: December 2022  SUBJECTIVE:   SUBJECTIVE STATEMENT: Pt stated the weather effects her pain, reports increased LBP as well as Rt posterior leg, pain scale 7/10 for hamstring pain and 10/10 for LBP.  Pt does not feel she needs to go to ER.  Took muscle relaxer prior session.  Stated she has been completing stretches daily at night.  Eval subjective: Patient states pain in R buttock and hamstring area that goes into R leg/knee. Patient states symptoms increase with movement and laying down. She has trouble sleeping from it. Sharp pains with resting. Laying in bed all day doesn't bother it. Symptoms began with insidious onset about 6-7 months ago.   PERTINENT HISTORY: HTN, Migraine, PTSD, CVA 03/04/20   PAIN:  Are you having pain? Yes: NPRS scale: 6/10 Pain location: R knee Pain description: tingling Aggravating factors: movement Relieving factors: laying in bed  PRECAUTIONS: None  WEIGHT BEARING RESTRICTIONS No  FALLS:  Has patient fallen in last 6 months? No  LIVING ENVIRONMENT: Lives with: lives with their family Lives in: House/apartment Stairs: Yes: Internal: flight steps; on right going up, on left going up, and can reach both and External: 3 steps; on right going up and on left going up Has following equipment at home: Walker - 4 wheeled  OCCUPATION: not employed  PLOF: [place] feel better   OBJECTIVE:   PATIENT SURVEYS:  LEFS 17/80  COGNITION:  Overall cognitive  status: Within functional limits for tasks assessed     SENSATION: WFL    POSTURE: rounded shoulders and forward head  PALPATION: TTP R glute max, glute med/min, proximal hamstring; no tenderness throughout rest of leg  LOWER EXTREMITY ROM:  decreased hip extension bilaterally, pain with TKE on R in R knee and hamstring  Active ROM Right eval Left eval  Hip flexion    Hip  extension    Hip abduction    Hip adduction    Hip internal rotation    Hip external rotation    Knee flexion    Knee extension    Ankle dorsiflexion    Ankle plantarflexion    Ankle inversion    Ankle eversion     (Blank rows = not tested)  LOWER EXTREMITY MMT:  MMT Right eval Left eval  Hip flexion 4/5 4/5  Hip extension 2+/5 2+/5  Hip abduction 4/5 4-/5  Hip adduction    Hip internal rotation    Hip external rotation    Knee flexion 4/5 4+/5  Knee extension 4/5 4+/5  Ankle dorsiflexion 4+/5 4+/5  Ankle plantarflexion    Ankle inversion    Ankle eversion     (Blank rows = not tested)   FUNCTIONAL TESTS:  5 times sit to stand: 22.28 seconds without UE use 2 minute walk test: 195 feet  GAIT: Distance walked: 195 feet Assistive device utilized: Walker - 4 wheeled Level of assistance: Modified independence Comments: 2MWT, antalgic on RLE with increased UE support used on R stance    TODAY'S TREATMENT: 11/17/21: Reviewed goals, educated importance of HEP compliance for maximal benefits STS 5 reps x 2 sets Log rolling Supine: bridge 4 sets x 5 reps  Hamstring stretch 5x 20"  Hamstring isometric 10x 10" Sidelying: clam 10x 5" 11/15/21 Supine active hamstring stretch 10x 5 second holds Supine hamstring isometric 10 x 10 second holds   PATIENT EDUCATION:  Education details: 11/17/21: Reviewed goals, educated importance of HEP compliance for maximal benefits 6/19 eval: Patient educated on exam findings, POC, scope of PT, HEP. Person educated: Patient Education method: Explanation, Demonstration, and Handouts Education comprehension: verbalized understanding, returned demonstration, verbal cues required, and tactile cues required   HOME EXERCISE PROGRAM: 6/19/23Access Code: [email or web address] - Supine Hamstring Stretch (Mirrored)  - 2-3 x daily - 7 x weekly - 10 reps - 5-10 second hold - Supine Isometric Hamstring Set  - 2-3 x daily - 7 x weekly - 10 reps - 10 second  hold  11/17/21: STS, bridge, discussed walking program   ASSESSMENT:  CLINICAL IMPRESSION: Reviewed goals, educated importance of HEP compliance, pt reports compliance with HEP daily at night.  Pt able to recall and demonstrate current exercises program.  Session focus with proximal strengthening and LE mobility.  Pt educated on log rolling to reduce strain on lower back for pain control.  Added STS and bridge for gluteal strengthening, pt able to complete with noted fatigue following 5 reps.  Reports back pain relief with bridges, added to HEP.  EOS pt reports LBP pain reduced to 5/10 and Rt hamstring pain resolved.   OBJECTIVE IMPAIRMENTS Abnormal gait, decreased balance, decreased endurance, decreased mobility, difficulty walking, decreased ROM, decreased strength, increased muscle spasms, improper body mechanics, and pain.   ACTIVITY LIMITATIONS lifting, bending, standing, squatting, sleeping, stairs, transfers, bed mobility, toileting, locomotion level, and caring for others  PARTICIPATION LIMITATIONS: cleaning, laundry, shopping, community activity, and yard work  [place], Past/current experiences, Time since onset  of injury/illness/exacerbation, and 3+ comorbidities: HTN, Migraine, PTSD, Acute CVA 03/04/20   are also affecting patient's functional outcome.   REHAB POTENTIAL: Good  CLINICAL DECISION MAKING: Stable/uncomplicated  EVALUATION COMPLEXITY: Low   GOALS: Goals reviewed with patient? Yes  SHORT TERM GOALS: Target date: 12/06/2021  Patient will be independent with HEP in order to improve functional outcomes. Baseline:  Goal status: IN PROGRESS  2.  Patient will report at least 25% improvement in symptoms for improved quality of life. Baseline:  Goal status: IN PROGRESS    LONG TERM GOALS: Target date: 12/27/2021  Patient will report at least 75% improvement in symptoms for improved quality of life. Baseline:  Goal status: IN PROGRESS  2.   Patient will be able to complete 5x STS in under 14 seconds in order to reduce the risk of falls. Baseline:  Goal status: IN PROGRESS  3.  Patient will be able to ambulate at least 250 feet in 2MWT in order to demonstrate improved gait speed for community ambulation.  Baseline: 195 feet Goal status: IN PROGRESS  4.  Patient will improve LEFS score by at least 9 points in order to indicate improved tolerance to activity. Baseline: 17/80 Goal status: IN PROGRESS  5.  Patient will demonstrate grade of 4+/5 MMT grade in all tested musculature as evidence of improved strength to assist with stair ambulation and gait.   Baseline: see MMT Goal status: IN PROGRESS     PLAN: PT FREQUENCY: 2x/week  PT DURATION: 6 weeks  PLANNED INTERVENTIONS: Therapeutic exercises, Therapeutic activity, Neuromuscular re-education, Balance training, Gait training, Patient/Family education, Joint manipulation, Joint mobilization, Stair training, Orthotic/Fit training, DME instructions, Aquatic Therapy, Dry Needling, Electrical stimulation, Spinal manipulation, Spinal mobilization, Cryotherapy, Moist heat, Compression bandaging, scar mobilization, Splintting, Taping, Traction, Ultrasound, Ionotophoresis '4mg'$ /ml Dexamethasone, and Manual therapy  PLAN FOR NEXT SESSION:  hamstring length, hip strength, balance training functional strength, progress as able  Ihor Austin, LPTA/CLT; CBIS [phone]  Aldona Lento, PTA 11/17/2021, 11:47 AM

## 2021-12-02 ENCOUNTER — Ambulatory Visit (HOSPITAL_COMMUNITY): Payer: Medicaid Other | Admitting: Physical Therapy

## 2021-12-07 ENCOUNTER — Ambulatory Visit (HOSPITAL_COMMUNITY): Payer: Medicaid Other | Attending: Physical Medicine & Rehabilitation | Admitting: Physical Therapy

## 2021-12-07 DIAGNOSIS — M25551 Pain in right hip: Secondary | ICD-10-CM | POA: Diagnosis not present

## 2021-12-07 DIAGNOSIS — R29818 Other symptoms and signs involving the nervous system: Secondary | ICD-10-CM | POA: Insufficient documentation

## 2021-12-07 DIAGNOSIS — R2689 Other abnormalities of gait and mobility: Secondary | ICD-10-CM | POA: Diagnosis not present

## 2021-12-07 DIAGNOSIS — M6281 Muscle weakness (generalized): Secondary | ICD-10-CM | POA: Insufficient documentation

## 2021-12-07 NOTE — Therapy (Signed)
OUTPATIENT PHYSICAL THERAPY LOWER EXTREMITY TREATMENT   Patient Name: Joyce [name] MRN: [id number] DOB:Mar 23, 1976, 46 y.o., female Today's Date: 12/09/2021    11/17/21 1125  PT Visits / Re-Eval  Visit Number 3  Number of Visits 12  Date for PT Re-Evaluation 12/27/21  Authorization  Authorization Type Medicaid [employer]  Authorization Time Period 7 visits approved 6/19-->01/13/22  PT Time Calculation  PT Start Time 945  PT Stop Time 1000  PT Time Calculation (min) 45 min  PT - End of Session  Activity Tolerance Patient tolerated treatment well  Behavior During Therapy [facility] for tasks assessed/performed     Past Medical History:  Diagnosis Date   Acute CVA (cerebrovascular accident) ([place]) 03/04/2020   Acute left-sided weakness 03/05/2020   Anemia    Depression    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   Hypertension    Lumbar radiculitis 06/23/2021   Migraine    PTSD (post-traumatic stress disorder) 05/01/2017   Round ligament pain 11/12/2014   Past Surgical History:  Procedure Laterality Date   CHOLECYSTECTOMY     Patient Active Problem List   Diagnosis Date Noted   Hamstring tendinitis at origin 10/13/2021   Lumbar radiculitis 06/23/2021   Left lateral epicondylitis 06/23/2021   Chronic left-sided low back pain without sciatica 07/01/2020   Neuropathic pain 05/06/2020   Anxiety state    Dyslipidemia    Migraine without status migrainosus, not intractable    History of stroke 03/10/2020   Left hemiparesis ([place]) 03/10/2020   Recurrent falls 03/05/2020   MDD (major depressive disorder), recurrent episode, moderate ([place]) 05/01/2017   PTSD (post-traumatic stress disorder) 05/01/2017   Essential hypertension 07/20/2016   Uterine fibroid 01/27/2016   Cervical high risk HPV (human papillomavirus) test positive 12/08/2014    PCP: Halina Maidens MD  REFERRING PROVIDER: Meredith Staggers, MD   REFERRING DIAG: G81.94 (ICD-10-CM) - Left hemiparesis ([place]) M79.2  (ICD-10-CM) - Neuropathic pain M76.899 (ICD-10-CM) - Hamstring tendinitis at origin   THERAPY DIAG:  Other abnormalities of gait and mobility  Muscle weakness (generalized)  Other symptoms and signs involving the nervous system  Rationale for Evaluation and Treatment Rehabilitation  ONSET DATE: December 2022  SUBJECTIVE:   SUBJECTIVE STATEMENT: Pt states she is doing well today yet states her LBP is 9/10.  States she feels her pain is increased due to drinking sodas. States she puts icy hot or ben gay on her back and that helps to ease it.  States she wants to return to work.  Eval subjective: Patient states pain in R buttock and hamstring area that goes into R leg/knee. Patient states symptoms increase with movement and laying down. She has trouble sleeping from it. Sharp pains with resting. Laying in bed all day doesn't bother it. Symptoms began with insidious onset about 6-7 months ago.   PERTINENT HISTORY: HTN, Migraine, PTSD, CVA 03/04/20   PAIN:  Are you having pain? Yes: NPRS scale: 9/10 Pain location: back Pain description: sharp constant pain Aggravating factors: movement Relieving factors: laying in bed  PRECAUTIONS: None  WEIGHT BEARING RESTRICTIONS No  FALLS:  Has patient fallen in last 6 months? No  LIVING ENVIRONMENT: Lives with: lives with their family Lives in: House/apartment Stairs: Yes: Internal: flight steps; on right going up, on left going up, and can reach both and External: 3 steps; on right going up and on left going up Has following equipment at home: Walker - 4 wheeled  OCCUPATION: not employed  PLOF: [place] feel  better   OBJECTIVE:   PATIENT SURVEYS:  LEFS 17/80  COGNITION:  Overall cognitive status: Within functional limits for tasks assessed     SENSATION: WFL    POSTURE: rounded shoulders and forward head  PALPATION: TTP R glute max, glute med/min, proximal hamstring; no tenderness throughout rest of  leg  LOWER EXTREMITY ROM:  decreased hip extension bilaterally, pain with TKE on R in R knee and hamstring  LOWER EXTREMITY MMT:  MMT Right eval Left eval  Hip flexion 4/5 4/5  Hip extension 2+/5 2+/5  Hip abduction 4/5 4-/5  Hip adduction    Hip internal rotation    Hip external rotation    Knee flexion 4/5 4+/5  Knee extension 4/5 4+/5  Ankle dorsiflexion 4+/5 4+/5  Ankle plantarflexion    Ankle inversion    Ankle eversion     (Blank rows = not tested)   FUNCTIONAL TESTS:  5 times sit to stand: 22.28 seconds without UE use 2 minute walk test: 195 feet  GAIT: Distance walked: 195 feet Assistive device utilized: Walker - 4 wheeled Level of assistance: Modified independence Comments: 2MWT, antalgic on RLE with increased UE support used on R stance    TODAY'S TREATMENT: 12/07/21: Supine: Bridge 2X10  SLR 2X10 each side Sidelying: hip abduction 10X Prone:  hamstring curls 10X  Hip abduction 10X  Heel press 10X5"  11/17/21: Reviewed goals, educated importance of HEP compliance for maximal benefits STS 5 reps x 2 sets Log rolling Supine: bridge 4 sets x 5 reps  Hamstring stretch 5x 20"  Hamstring isometric 10x 10" Sidelying: clam 10x 5"  11/15/21 Supine active hamstring stretch 10x 5 second holds Supine hamstring isometric 10 x 10 second holds   PATIENT EDUCATION:  Education details: 11/17/21: Reviewed goals, educated importance of HEP compliance for maximal benefits 6/19 eval: Patient educated on exam findings, POC, scope of PT, HEP. Person educated: Patient Education method: Explanation, Demonstration, and Handouts Education comprehension: verbalized understanding, returned demonstration, verbal cues required, and tactile cues required   HOME EXERCISE PROGRAM: 6/19/23Access Code: [email or web address] - Supine Hamstring Stretch (Mirrored)  - 2-3 x daily - 7 x weekly - 10 reps - 5-10 second hold - Supine Isometric Hamstring Set  - 2-3 x daily - 7 x weekly - 10 reps -  10 second hold  11/17/21: STS, bridge, discussed walking program   ASSESSMENT:  CLINICAL IMPRESSION: Pt comes today with rollator; discussed discontinuing this as with good strength and gait noted.  PT reports she only uses it when she's having increased pain or feeling weaker.  Continued with LE strengthening exercises.  PT able to complete exercises slowly and controlled with minimal cues.  Deep breathing patterns observed with therex.  Added SLR in supine, sidelying and prone to work on weaknesses.  Able to increase to 2 sets of 10 rather than 4 sets of 5 with most activities noting increased endurance/strength.  Pt will continue to benefit from skilled therapy to reduce deficits and return to work.   OBJECTIVE IMPAIRMENTS Abnormal gait, decreased balance, decreased endurance, decreased mobility, difficulty walking, decreased ROM, decreased strength, increased muscle spasms, improper body mechanics, and pain.   ACTIVITY LIMITATIONS lifting, bending, standing, squatting, sleeping, stairs, transfers, bed mobility, toileting, locomotion level, and caring for others  PARTICIPATION LIMITATIONS: cleaning, laundry, shopping, community activity, and yard work  [place], Past/current experiences, Time since onset of injury/illness/exacerbation, and 3+ comorbidities: HTN, Migraine, PTSD, Acute CVA 03/04/20   are also affecting patient's functional outcome.  REHAB POTENTIAL: Good  CLINICAL DECISION MAKING: Stable/uncomplicated  EVALUATION COMPLEXITY: Low   GOALS: Goals reviewed with patient? Yes  SHORT TERM GOALS: Target date: 12/06/2021  Patient will be independent with HEP in order to improve functional outcomes. Baseline:  Goal status: IN PROGRESS  2.  Patient will report at least 25% improvement in symptoms for improved quality of life. Baseline:  Goal status: IN PROGRESS    LONG TERM GOALS: Target date: 12/27/2021  Patient will report at least 75% improvement in  symptoms for improved quality of life. Baseline:  Goal status: IN PROGRESS  2.  Patient will be able to complete 5x STS in under 14 seconds in order to reduce the risk of falls. Baseline:  Goal status: IN PROGRESS  3.  Patient will be able to ambulate at least 250 feet in 2MWT in order to demonstrate improved gait speed for community ambulation.  Baseline: 195 feet Goal status: IN PROGRESS  4.  Patient will improve LEFS score by at least 9 points in order to indicate improved tolerance to activity. Baseline: 17/80 Goal status: IN PROGRESS  5.  Patient will demonstrate grade of 4+/5 MMT grade in all tested musculature as evidence of improved strength to assist with stair ambulation and gait.   Baseline: see MMT Goal status: IN PROGRESS     PLAN: PT FREQUENCY: 2x/week  PT DURATION: 6 weeks  PLANNED INTERVENTIONS: Therapeutic exercises, Therapeutic activity, Neuromuscular re-education, Balance training, Gait training, Patient/Family education, Joint manipulation, Joint mobilization, Stair training, Orthotic/Fit training, DME instructions, Aquatic Therapy, Dry Needling, Electrical stimulation, Spinal manipulation, Spinal mobilization, Cryotherapy, Moist heat, Compression bandaging, scar mobilization, Splintting, Taping, Traction, Ultrasound, Ionotophoresis '4mg'$ /ml Dexamethasone, and Manual therapy  PLAN FOR NEXT SESSION:  hamstring length, hip strength, balance training functional strength, progress as able  Teena Irani, PTA/CLT [place] Ph: [phone]  Teena Irani, PTA 12/09/2021, 9:01 AM

## 2021-12-09 ENCOUNTER — Ambulatory Visit (HOSPITAL_COMMUNITY): Payer: Medicaid Other | Admitting: Physical Therapy

## 2021-12-09 DIAGNOSIS — R29818 Other symptoms and signs involving the nervous system: Secondary | ICD-10-CM

## 2021-12-09 DIAGNOSIS — R2689 Other abnormalities of gait and mobility: Secondary | ICD-10-CM

## 2021-12-09 DIAGNOSIS — M6281 Muscle weakness (generalized): Secondary | ICD-10-CM | POA: Diagnosis not present

## 2021-12-09 DIAGNOSIS — M25551 Pain in right hip: Secondary | ICD-10-CM | POA: Diagnosis not present

## 2021-12-09 NOTE — Therapy (Signed)
OUTPATIENT PHYSICAL THERAPY LOWER EXTREMITY TREATMENT   Patient Name: Joyce [name] MRN: [id number] DOB:02-01-1976, 46 y.o., female Today's Date: 12/09/2021  END OF SESSION:   PT End of Session - 12/09/21 0953     Visit Number 4    Number of Visits 12    Date for PT Re-Evaluation 12/27/21    Authorization Type Medicaid [employer]    Authorization Time Period 7 visits approved 6/19-->01/13/22    Authorization - Visit Number 4    Authorization - Number of Visits 7    PT Start Time 0948    PT Stop Time 1028    PT Time Calculation (min) 40 min    Activity Tolerance Patient tolerated treatment well    Behavior During Therapy [facility] for tasks assessed/performed             Past Medical History:  Diagnosis Date   Acute CVA (cerebrovascular accident) ([place]) 03/04/2020   Acute left-sided weakness 03/05/2020   Anemia    Depression    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   Hypertension    Lumbar radiculitis 06/23/2021   Migraine    PTSD (post-traumatic stress disorder) 05/01/2017   Round ligament pain 11/12/2014   Past Surgical History:  Procedure Laterality Date   CHOLECYSTECTOMY     Patient Active Problem List   Diagnosis Date Noted   Hamstring tendinitis at origin 10/13/2021   Lumbar radiculitis 06/23/2021   Left lateral epicondylitis 06/23/2021   Chronic left-sided low back pain without sciatica 07/01/2020   Neuropathic pain 05/06/2020   Anxiety state    Dyslipidemia    Migraine without status migrainosus, not intractable    History of stroke 03/10/2020   Left hemiparesis ([place]) 03/10/2020   Recurrent falls 03/05/2020   MDD (major depressive disorder), recurrent episode, moderate ([place]) 05/01/2017   PTSD (post-traumatic stress disorder) 05/01/2017   Essential hypertension 07/20/2016   Uterine fibroid 01/27/2016   Cervical high risk HPV (human papillomavirus) test positive 12/08/2014    PCP: Halina Maidens MD  REFERRING PROVIDER: Meredith Staggers, MD   REFERRING  DIAG: G81.94 (ICD-10-CM) - Left hemiparesis ([place]) M79.2 (ICD-10-CM) - Neuropathic pain M76.899 (ICD-10-CM) - Hamstring tendinitis at origin   THERAPY DIAG:  Other abnormalities of gait and mobility  Muscle weakness (generalized)  Other symptoms and signs involving the nervous system  Rationale for Evaluation and Treatment Rehabilitation  ONSET DATE: December 2022  SUBJECTIVE:   SUBJECTIVE STATEMENT: Pt states she has not had any pain since her last visit.  Comes today without AD (was using rollator last visit).  Currently painfree.    Eval subjective: Patient states pain in R buttock and hamstring area that goes into R leg/knee. Patient states symptoms increase with movement and laying down. She has trouble sleeping from it. Sharp pains with resting. Laying in bed all day doesn't bother it. Symptoms began with insidious onset about 6-7 months ago.   PERTINENT HISTORY: HTN, Migraine, PTSD, CVA 03/04/20   PAIN:  Are you having pain? No  PRECAUTIONS: None  WEIGHT BEARING RESTRICTIONS No  FALLS:  Has patient fallen in last 6 months? No  LIVING ENVIRONMENT: Lives with: lives with their family Lives in: House/apartment Stairs: Yes: Internal: flight steps; on right going up, on left going up, and can reach both and External: 3 steps; on right going up and on left going up Has following equipment at home: Walker - 4 wheeled  OCCUPATION: not employed  PLOF: [place] feel better   OBJECTIVE:  PATIENT SURVEYS:  LEFS 17/80  COGNITION:  Overall cognitive status: Within functional limits for tasks assessed     SENSATION: WFL    POSTURE: rounded shoulders and forward head  PALPATION: TTP R glute max, glute med/min, proximal hamstring; no tenderness throughout rest of leg  LOWER EXTREMITY ROM:  decreased hip extension bilaterally, pain with TKE on R in R knee and hamstring  LOWER EXTREMITY MMT:  MMT Right eval Left eval  Hip flexion 4/5 4/5   Hip extension 2+/5 2+/5  Hip abduction 4/5 4-/5  Hip adduction    Hip internal rotation    Hip external rotation    Knee flexion 4/5 4+/5  Knee extension 4/5 4+/5  Ankle dorsiflexion 4+/5 4+/5  Ankle plantarflexion    Ankle inversion    Ankle eversion     (Blank rows = not tested)   FUNCTIONAL TESTS:  5 times sit to stand: 22.28 seconds without UE use 2 minute walk test: 195 feet  GAIT: Distance walked: 195 feet Assistive device utilized: Walker - 4 wheeled Level of assistance: Modified independence Comments: 2MWT, antalgic on RLE with increased UE support used on R stance    TODAY'S TREATMENT: 12/09/21: Supine Bridge 20X  SLR 2X10 each  Sidelying:  hip abduction 2X10 Prone: Hip extension 2X10 Seated:  sit to stands eccentric control no UE assist 10X Standing: Hip abduction  Hip extension  Heelraise/toeraise 20X  12/07/21: Supine: Bridge 2X10  SLR 2X10 each side Sidelying: hip abduction 10X Prone:  hamstring curls 10X  Hip extension 10X  Heel press 10X5"  11/17/21: Reviewed goals, educated importance of HEP compliance for maximal benefits STS 5 reps x 2 sets Log rolling Supine: bridge 4 sets x 5 reps  Hamstring stretch 5x 20"  Hamstring isometric 10x 10" Sidelying: clam 10x 5"  11/15/21 Supine active hamstring stretch 10x 5 second holds Supine hamstring isometric 10 x 10 second holds   PATIENT EDUCATION:  Education details: 11/17/21: Reviewed goals, educated importance of HEP compliance for maximal benefits 6/19 eval: Patient educated on exam findings, POC, scope of PT, HEP. Person educated: Patient Education method: Explanation, Demonstration, and Handouts Education comprehension: verbalized understanding, returned demonstration, verbal cues required, and tactile cues required   HOME EXERCISE PROGRAM: 6/19/23Access Code: [email or web address] - Supine Hamstring Stretch (Mirrored)  - 2-3 x daily - 7 x weekly - 10 reps - 5-10 second hold - Supine Isometric  Hamstring Set  - 2-3 x daily - 7 x weekly - 10 reps - 10 second hold  11/17/21: STS, bridge, discussed walking program   ASSESSMENT:  CLINICAL IMPRESSION: Painfree and without AD today.  Overall improving.  Progressed to standing exercises this session.  Pt able to complete all without any deficits, assistance or pain.  Sit to stand most challenging with eccentric lowering. Pt able to complete exercises slowly and controlled with minimal cues and no rest breaks.   Pt will continue to benefit from skilled therapy to reduce deficits and return to work.   OBJECTIVE IMPAIRMENTS Abnormal gait, decreased balance, decreased endurance, decreased mobility, difficulty walking, decreased ROM, decreased strength, increased muscle spasms, improper body mechanics, and pain.   ACTIVITY LIMITATIONS lifting, bending, standing, squatting, sleeping, stairs, transfers, bed mobility, toileting, locomotion level, and caring for others  PARTICIPATION LIMITATIONS: cleaning, laundry, shopping, community activity, and yard work  [place], Past/current experiences, Time since onset of injury/illness/exacerbation, and 3+ comorbidities: HTN, Migraine, PTSD, Acute CVA 03/04/20   are also affecting patient's functional outcome.   REHAB  POTENTIAL: Good  CLINICAL DECISION MAKING: Stable/uncomplicated  EVALUATION COMPLEXITY: Low   GOALS: Goals reviewed with patient? Yes  SHORT TERM GOALS: Target date: 12/06/2021  Patient will be independent with HEP in order to improve functional outcomes. Baseline:  Goal status: IN PROGRESS  2.  Patient will report at least 25% improvement in symptoms for improved quality of life. Baseline:  Goal status: IN PROGRESS    LONG TERM GOALS: Target date: 12/27/2021  Patient will report at least 75% improvement in symptoms for improved quality of life. Baseline:  Goal status: IN PROGRESS  2.  Patient will be able to complete 5x STS in under 14 seconds in order to  reduce the risk of falls. Baseline:  Goal status: IN PROGRESS  3.  Patient will be able to ambulate at least 250 feet in 2MWT in order to demonstrate improved gait speed for community ambulation.  Baseline: 195 feet Goal status: IN PROGRESS  4.  Patient will improve LEFS score by at least 9 points in order to indicate improved tolerance to activity. Baseline: 17/80 Goal status: IN PROGRESS  5.  Patient will demonstrate grade of 4+/5 MMT grade in all tested musculature as evidence of improved strength to assist with stair ambulation and gait.   Baseline: see MMT Goal status: IN PROGRESS     PLAN: PT FREQUENCY: 2x/week  PT DURATION: 6 weeks  PLANNED INTERVENTIONS: Therapeutic exercises, Therapeutic activity, Neuromuscular re-education, Balance training, Gait training, Patient/Family education, Joint manipulation, Joint mobilization, Stair training, Orthotic/Fit training, DME instructions, Aquatic Therapy, Dry Needling, Electrical stimulation, Spinal manipulation, Spinal mobilization, Cryotherapy, Moist heat, Compression bandaging, scar mobilization, Splintting, Taping, Traction, Ultrasound, Ionotophoresis '4mg'$ /ml Dexamethasone, and Manual therapy  PLAN FOR NEXT SESSION:  hamstring length, hip strength, balance training functional strength, progress as able  Teena Irani, PTA/CLT [place] Ph: [phone]  Teena Irani, PTA 12/09/2021, 10:29 AM

## 2021-12-14 ENCOUNTER — Ambulatory Visit (HOSPITAL_COMMUNITY): Payer: Medicaid Other | Admitting: Physical Therapy

## 2021-12-14 ENCOUNTER — Telehealth (HOSPITAL_COMMUNITY): Payer: Self-pay | Admitting: Physical Therapy

## 2021-12-14 NOTE — Telephone Encounter (Signed)
Pt did not show for appointment.  Called and reports she totally forgot her appointment. States she will be at her next one on Thursday.  Teena Irani, PTA/CLT [place] Ph: [phone]

## 2021-12-16 ENCOUNTER — Ambulatory Visit (HOSPITAL_COMMUNITY): Payer: Medicaid Other | Admitting: Physical Therapy

## 2021-12-16 ENCOUNTER — Encounter (HOSPITAL_COMMUNITY): Payer: Self-pay | Admitting: Physical Therapy

## 2021-12-16 DIAGNOSIS — M6281 Muscle weakness (generalized): Secondary | ICD-10-CM

## 2021-12-16 DIAGNOSIS — R2689 Other abnormalities of gait and mobility: Secondary | ICD-10-CM

## 2021-12-16 DIAGNOSIS — R29818 Other symptoms and signs involving the nervous system: Secondary | ICD-10-CM | POA: Diagnosis not present

## 2021-12-16 DIAGNOSIS — M25551 Pain in right hip: Secondary | ICD-10-CM

## 2021-12-16 NOTE — Therapy (Signed)
OUTPATIENT PHYSICAL THERAPY LOWER EXTREMITY TREATMENT   Patient Name: Joyce [name] MRN: [id number] DOB:Apr 11, 1976, 46 y.o., female Today's Date: 12/16/2021  END OF SESSION:   PT End of Session - 12/16/21 1118     Visit Number 5    Number of Visits 12    Date for PT Re-Evaluation 12/27/21    Authorization Type Medicaid [employer]    Authorization Time Period 7 visits approved 6/19-->01/13/22    Authorization - Visit Number 5    Authorization - Number of Visits 7    PT Start Time 0762    PT Stop Time 2633    PT Time Calculation (min) 39 min    Activity Tolerance Patient tolerated treatment well    Behavior During Therapy [facility] for tasks assessed/performed             Past Medical History:  Diagnosis Date   Acute CVA (cerebrovascular accident) ([place]) 03/04/2020   Acute left-sided weakness 03/05/2020   Anemia    Depression    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   Hypertension    Lumbar radiculitis 06/23/2021   Migraine    PTSD (post-traumatic stress disorder) 05/01/2017   Round ligament pain 11/12/2014   Past Surgical History:  Procedure Laterality Date   CHOLECYSTECTOMY     Patient Active Problem List   Diagnosis Date Noted   Hamstring tendinitis at origin 10/13/2021   Lumbar radiculitis 06/23/2021   Left lateral epicondylitis 06/23/2021   Chronic left-sided low back pain without sciatica 07/01/2020   Neuropathic pain 05/06/2020   Anxiety state    Dyslipidemia    Migraine without status migrainosus, not intractable    History of stroke 03/10/2020   Left hemiparesis ([place]) 03/10/2020   Recurrent falls 03/05/2020   MDD (major depressive disorder), recurrent episode, moderate ([place]) 05/01/2017   PTSD (post-traumatic stress disorder) 05/01/2017   Essential hypertension 07/20/2016   Uterine fibroid 01/27/2016   Cervical high risk HPV (human papillomavirus) test positive 12/08/2014    PCP: Halina Maidens MD  REFERRING PROVIDER: Meredith Staggers, MD   REFERRING  DIAG: G81.94 (ICD-10-CM) - Left hemiparesis ([place]) M79.2 (ICD-10-CM) - Neuropathic pain M76.899 (ICD-10-CM) - Hamstring tendinitis at origin   THERAPY DIAG:  Other abnormalities of gait and mobility  Muscle weakness (generalized)  Other symptoms and signs involving the nervous system  Pain in right hip  Rationale for Evaluation and Treatment Rehabilitation  ONSET DATE: December 2022  SUBJECTIVE:   SUBJECTIVE STATEMENT: Patient has been trying to walk more. Was really sore after last session and errands. Been tying to do more exercises.   PERTINENT HISTORY: HTN, Migraine, PTSD, CVA 03/04/20   PAIN:  Are you having pain? No and Yes: NPRS scale: 6/10 Pain location: R hip Pain description: sore Aggravating factors: walking Relieving factors: rest  PRECAUTIONS: None  WEIGHT BEARING RESTRICTIONS No  FALLS:  Has patient fallen in last 6 months? No  LIVING ENVIRONMENT: Lives with: lives with their family Lives in: House/apartment Stairs: Yes: Internal: flight steps; on right going up, on left going up, and can reach both and External: 3 steps; on right going up and on left going up Has following equipment at home: Walker - 4 wheeled  OCCUPATION: not employed  PLOF: [place] feel better   OBJECTIVE:   PATIENT SURVEYS:  LEFS 17/80  COGNITION:  Overall cognitive status: Within functional limits for tasks assessed     SENSATION: WFL    POSTURE: rounded shoulders and forward head  PALPATION: TTP  R glute max, glute med/min, proximal hamstring; no tenderness throughout rest of leg  LOWER EXTREMITY ROM:  decreased hip extension bilaterally, pain with TKE on R in R knee and hamstring  LOWER EXTREMITY MMT:  MMT Right eval Left eval  Hip flexion 4/5 4/5  Hip extension 2+/5 2+/5  Hip abduction 4/5 4-/5  Hip adduction    Hip internal rotation    Hip external rotation    Knee flexion 4/5 4+/5  Knee extension 4/5 4+/5  Ankle dorsiflexion  4+/5 4+/5  Ankle plantarflexion    Ankle inversion    Ankle eversion     (Blank rows = not tested)   FUNCTIONAL TESTS:  5 times sit to stand: 22.28 seconds without UE use 2 minute walk test: 195 feet  GAIT: Distance walked: 195 feet Assistive device utilized: Walker - 4 wheeled Level of assistance: Modified independence Comments: 2MWT, antalgic on RLE with increased UE support used on R stance    TODAY'S TREATMENT: 12/16/21 SKTC stretch 3x 20 second holds RLE Supine active hamstring stretch 10x 10 second holds RLE Bridge 3x 10  Supine hamstring isometric 10 x 10 second holds DKTC with red ball 2x 10 5 second holds Prone hip extension 2x 10 bilateral  Prone heel squeeze 10 x 5 second holds Prone hamstring curl 2x 10 bilateral Step up 4 inch 1x 10 bilateral   12/09/21: Supine Bridge 20X  SLR 2X10 each  Sidelying:  hip abduction 2X10 Prone: Hip extension 2X10 Seated:  sit to stands eccentric control no UE assist 10X Standing: Hip abduction  Hip extension  Heelraise/toeraise 20X  12/07/21: Supine: Bridge 2X10  SLR 2X10 each side Sidelying: hip abduction 10X Prone:  hamstring curls 10X  Hip extension 10X  Heel press 10X5"  11/17/21: Reviewed goals, educated importance of HEP compliance for maximal benefits STS 5 reps x 2 sets Log rolling Supine: bridge 4 sets x 5 reps  Hamstring stretch 5x 20"  Hamstring isometric 10x 10" Sidelying: clam 10x 5"  11/15/21 Supine active hamstring stretch 10x 5 second holds Supine hamstring isometric 10 x 10 second holds   PATIENT EDUCATION:  Education details: 11/17/21: Reviewed goals, educated importance of HEP compliance for maximal benefits 6/19 eval: Patient educated on exam findings, POC, scope of PT, HEP. Person educated: Patient Education method: Explanation, Demonstration, and Handouts Education comprehension: verbalized understanding, returned demonstration, verbal cues required, and tactile cues required   HOME  EXERCISE PROGRAM: 6/19/23Access Code: [email or web address] - Supine Hamstring Stretch (Mirrored)  - 2-3 x daily - 7 x weekly - 10 reps - 5-10 second hold - Supine Isometric Hamstring Set  - 2-3 x daily - 7 x weekly - 10 reps - 10 second hold  11/17/21: STS, bridge, discussed walking program  7/20 prone hamstring curl, hip extension, walking program   ASSESSMENT:  CLINICAL IMPRESSION: Began session with table exercises and stretches which are tolerated well. She is able to complete additional reps of previously completed exercises but notes moderate fatigue. Intermittent c/o symptoms throughout session. Added step ups for improving functional strength to return to work. Patient will continue to benefit from physical therapy in order to improve function and reduce impairment.   OBJECTIVE IMPAIRMENTS Abnormal gait, decreased balance, decreased endurance, decreased mobility, difficulty walking, decreased ROM, decreased strength, increased muscle spasms, improper body mechanics, and pain.   ACTIVITY LIMITATIONS lifting, bending, standing, squatting, sleeping, stairs, transfers, bed mobility, toileting, locomotion level, and caring for others  PARTICIPATION LIMITATIONS: cleaning, laundry, shopping, community activity, and yard  work  PERSONAL FACTORS [place], Past/current experiences, Time since onset of injury/illness/exacerbation, and 3+ comorbidities: HTN, Migraine, PTSD, Acute CVA 03/04/20   are also affecting patient's functional outcome.   REHAB POTENTIAL: Good  CLINICAL DECISION MAKING: Stable/uncomplicated  EVALUATION COMPLEXITY: Low   GOALS: Goals reviewed with patient? Yes  SHORT TERM GOALS: Target date: 12/06/2021  Patient will be independent with HEP in order to improve functional outcomes. Baseline:  Goal status: IN PROGRESS  2.  Patient will report at least 25% improvement in symptoms for improved quality of life. Baseline:  Goal status: IN PROGRESS    LONG TERM GOALS: Target  date: 12/27/2021  Patient will report at least 75% improvement in symptoms for improved quality of life. Baseline:  Goal status: IN PROGRESS  2.  Patient will be able to complete 5x STS in under 14 seconds in order to reduce the risk of falls. Baseline:  Goal status: IN PROGRESS  3.  Patient will be able to ambulate at least 250 feet in 2MWT in order to demonstrate improved gait speed for community ambulation.  Baseline: 195 feet Goal status: IN PROGRESS  4.  Patient will improve LEFS score by at least 9 points in order to indicate improved tolerance to activity. Baseline: 17/80 Goal status: IN PROGRESS  5.  Patient will demonstrate grade of 4+/5 MMT grade in all tested musculature as evidence of improved strength to assist with stair ambulation and gait.   Baseline: see MMT Goal status: IN PROGRESS     PLAN: PT FREQUENCY: 2x/week  PT DURATION: 6 weeks  PLANNED INTERVENTIONS: Therapeutic exercises, Therapeutic activity, Neuromuscular re-education, Balance training, Gait training, Patient/Family education, Joint manipulation, Joint mobilization, Stair training, Orthotic/Fit training, DME instructions, Aquatic Therapy, Dry Needling, Electrical stimulation, Spinal manipulation, Spinal mobilization, Cryotherapy, Moist heat, Compression bandaging, scar mobilization, Splintting, Taping, Traction, Ultrasound, Ionotophoresis '4mg'$ /ml Dexamethasone, and Manual therapy  PLAN FOR NEXT SESSION:  hamstring length, hip strength, balance training functional strength, progress as able    Mearl Latin, PT 12/16/2021, 11:19 AM

## 2021-12-21 ENCOUNTER — Encounter (HOSPITAL_COMMUNITY): Payer: Medicaid Other | Admitting: Physical Therapy

## 2021-12-23 ENCOUNTER — Telehealth (HOSPITAL_COMMUNITY): Payer: Self-pay

## 2021-12-23 ENCOUNTER — Encounter (HOSPITAL_COMMUNITY): Payer: Medicaid Other

## 2021-12-23 NOTE — Telephone Encounter (Signed)
2nd non-consecutive no show.  Called pt. who stated her car broke down and just got her phone back.  Reminded next apt date and time and requested pt to call if unable to make.  Stated she will have transportation to get there next Tuesday.  Ihor Austin, LPTA/CLT; Delana Meyer [phone]

## 2021-12-27 ENCOUNTER — Other Ambulatory Visit: Payer: Self-pay | Admitting: Internal Medicine

## 2021-12-28 ENCOUNTER — Ambulatory Visit (HOSPITAL_COMMUNITY): Payer: Medicaid Other | Attending: Physical Medicine & Rehabilitation | Admitting: Physical Therapy

## 2021-12-28 DIAGNOSIS — M25551 Pain in right hip: Secondary | ICD-10-CM

## 2021-12-28 DIAGNOSIS — M6281 Muscle weakness (generalized): Secondary | ICD-10-CM | POA: Diagnosis not present

## 2021-12-28 DIAGNOSIS — R29818 Other symptoms and signs involving the nervous system: Secondary | ICD-10-CM | POA: Diagnosis not present

## 2021-12-28 DIAGNOSIS — R2689 Other abnormalities of gait and mobility: Secondary | ICD-10-CM | POA: Diagnosis not present

## 2021-12-28 NOTE — Therapy (Signed)
OUTPATIENT PHYSICAL THERAPY LOWER EXTREMITY TREATMENT Progress Note Reporting Period 11/15/21 to 12/28/21  See note below for Objective Data and Assessment of Progress/Goals.      Patient Name: Joyce [name] MRN: [id number] DOB:May 31, 1975, 46 y.o., female Today's Date: 12/28/2021  END OF SESSION:   PT End of Session - 12/28/21 1104     Visit Number 6    Number of Visits 12    Date for PT Re-Evaluation 12/27/21    Authorization Type Medicaid [employer]    Authorization Time Period 7 visits approved 6/19-->01/13/22    Authorization - Visit Number 6    Authorization - Number of Visits 7    PT Start Time 1034    PT Stop Time 1105    PT Time Calculation (min) 31 min    Activity Tolerance Patient tolerated treatment well    Behavior During Therapy [facility] for tasks assessed/performed           Recertification to 0/27/25 Requesting 8 additional visits - check auth   Past Medical History:  Diagnosis Date   Acute CVA (cerebrovascular accident) ([place]) 03/04/2020   Acute left-sided weakness 03/05/2020   Anemia    Depression    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   Hypertension    Lumbar radiculitis 06/23/2021   Migraine    PTSD (post-traumatic stress disorder) 05/01/2017   Round ligament pain 11/12/2014   Past Surgical History:  Procedure Laterality Date   CHOLECYSTECTOMY     Patient Active Problem List   Diagnosis Date Noted   Hamstring tendinitis at origin 10/13/2021   Lumbar radiculitis 06/23/2021   Left lateral epicondylitis 06/23/2021   Chronic left-sided low back pain without sciatica 07/01/2020   Neuropathic pain 05/06/2020   Anxiety state    Dyslipidemia    Migraine without status migrainosus, not intractable    History of stroke 03/10/2020   Left hemiparesis ([place]) 03/10/2020   Recurrent falls 03/05/2020   MDD (major depressive disorder), recurrent episode, moderate ([place]) 05/01/2017   PTSD (post-traumatic stress disorder) 05/01/2017   Essential hypertension  07/20/2016   Uterine fibroid 01/27/2016   Cervical high risk HPV (human papillomavirus) test positive 12/08/2014    PCP: Halina Maidens MD  REFERRING PROVIDER: Meredith Staggers, MD   REFERRING DIAG: G81.94 (ICD-10-CM) - Left hemiparesis ([place]) M79.2 (ICD-10-CM) - Neuropathic pain M76.899 (ICD-10-CM) - Hamstring tendinitis at origin   THERAPY DIAG:  No diagnosis found.  Other abnormalities of gait and mobility   Muscle weakness (generalized)   Other symptoms and signs involving the nervous system   Pain in right hip  Rationale for Evaluation and Treatment Rehabilitation  ONSET DATE: December 2022  SUBJECTIVE:   SUBJECTIVE STATEMENT: Patient states she missed her last appt due to car trouble.  States she went to [facility] over the weekend with her family and is tired from the 3 hour drive there and back. Reports she is still doing her exercises at home and walking.  Reports she feels she is 50% better.  PERTINENT HISTORY: HTN, Migraine, PTSD, CVA 03/04/20   PAIN:  Are you having pain? No  PRECAUTIONS: None  WEIGHT BEARING RESTRICTIONS No  FALLS:  Has patient fallen in last 6 months? No  LIVING ENVIRONMENT: Lives with: lives with their family Lives in: House/apartment Stairs: Yes: Internal: flight steps; on right going up, on left going up, and can reach both and External: 3 steps; on right going up and on left going up Has following equipment at home: Gilford Rile - 4 wheeled  OCCUPATION: not employed  PLOF: Independent  PATIENT GOALS feel better   OBJECTIVE:   PATIENT SURVEYS:  LEFS 12/28/21:  51/80 ( was 17/80 at evaluation)   COGNITION:  Overall cognitive status: Within functional limits for tasks assessed     SENSATION: WFL    POSTURE: rounded shoulders and forward head  PALPATION: TTP R glute max, glute med/min, proximal hamstring; no tenderness throughout rest of leg  LOWER EXTREMITY ROM:  decreased hip extension bilaterally, pain with TKE  on R in R knee and hamstring  LOWER EXTREMITY MMT:  MMT Right eval Right 12/28/21 Left eval Left  12/28/21  Hip flexion 4/5 4+ 4/5 4+  Hip extension 2+/5 3- 2+/5 3+  Hip abduction 4/5 4+ 4-/5 4  Hip adduction      Hip internal rotation      Hip external rotation      Knee flexion 4/5 4+ 4+/5 5  Knee extension 4/5 4+ 4+/5 5  Ankle dorsiflexion 4+/5 5 4+/5 5  Ankle plantarflexion      Ankle inversion      Ankle eversion       (Blank rows = not tested)   FUNCTIONAL TESTS:  5 times sit to stand: 12/28/21:  18.8 seconds (was 22.28 seconds without UE at evaluation) 2 minute walk test: 12/28/21:  412 feet no AD (was 195 feet at evaluation)  GAIT: 12/28/21: Distance walked: 412 feet Assistive device utilized: no AD Level of assistance: Independent Comments: 2MWT no gait deviations  Evaluation:  Distance walked: 195 feet Assistive device utilized: Environmental consultant - 4 wheeled Level of assistance: Modified independence Comments: 2MWT, antalgic on RLE with increased UE support used on R stance    TODAY'S TREATMENT: 12/16/21 SKTC stretch 3x 20 second holds RLE Supine active hamstring stretch 10x 10 second holds RLE Bridge 3x 10  Supine hamstring isometric 10 x 10 second holds DKTC with red ball 2x 10 5 second holds Prone hip extension 2x 10 bilateral  Prone heel squeeze 10 x 5 second holds Prone hamstring curl 2x 10 bilateral Step up 4 inch 1x 10 bilateral   12/09/21: Supine Bridge 20X  SLR 2X10 each  Sidelying:  hip abduction 2X10 Prone: Hip extension 2X10 Seated:  sit to stands eccentric control no UE assist 10X Standing: Hip abduction  Hip extension  Heelraise/toeraise 20X  12/07/21: Supine: Bridge 2X10  SLR 2X10 each side Sidelying: hip abduction 10X Prone:  hamstring curls 10X  Hip extension 10X  Heel press 10X5"  11/17/21: Reviewed goals, educated importance of HEP compliance for maximal benefits STS 5 reps x 2 sets Log rolling Supine: bridge 4 sets x 5 reps  Hamstring  stretch 5x 20"  Hamstring isometric 10x 10" Sidelying: clam 10x 5"  11/15/21 Supine active hamstring stretch 10x 5 second holds Supine hamstring isometric 10 x 10 second holds   PATIENT EDUCATION:  Education details: 11/17/21: Reviewed goals, educated importance of HEP compliance for maximal benefits 6/19 eval: Patient educated on exam findings, POC, scope of PT, HEP. Person educated: Patient Education method: Explanation, Demonstration, and Handouts Education comprehension: verbalized understanding, returned demonstration, verbal cues required, and tactile cues required   HOME EXERCISE PROGRAM: 6/19/23Access Code: [email or web address] - Supine Hamstring Stretch (Mirrored)  - 2-3 x daily - 7 x weekly - 10 reps - 5-10 second hold - Supine Isometric Hamstring Set  - 2-3 x daily - 7 x weekly - 10 reps - 10 second hold  11/17/21: STS, bridge, discussed walking program  7/20 prone hamstring  curl, hip extension, walking program   ASSESSMENT:  CLINICAL IMPRESSION: Recert completed this session with patient reporting overall 50% improvement.  Pt has doubled her 2 minute walking distance and is also now ambulating without AD.  Pt has made 1/2 grade improvement in all LE mm tested.  Pt has met both short term goals and 2/5 long term goals.  Pt would like to continue to progress toward goal completion.  Patient will continue to benefit from physical therapy in order to improve function and reduce impairment.  Patient has met all short term goals and 2/5 long term goals with ability to complete HEP and improvement in symptoms, gait, strength, and functional mobility. Remaining goals not met due to continued strength and mobility deficits with symptoms present. Patient continues to have symptoms with walking/mobility leading to impaired participation with family. Extending POC 2x/week for 4 weeks and will request additional visits from insurance. Patient will continue to benefit from physical therapy in order to  improve function and reduce impairment. 7:45 AM, 12/30/21 Mearl Latin PT, DPT Physical Therapist at [facility]    OBJECTIVE IMPAIRMENTS Abnormal gait, decreased balance, decreased endurance, decreased mobility, difficulty walking, decreased ROM, decreased strength, increased muscle spasms, improper body mechanics, and pain.   ACTIVITY LIMITATIONS lifting, bending, standing, squatting, sleeping, stairs, transfers, bed mobility, toileting, locomotion level, and caring for others  PARTICIPATION LIMITATIONS: cleaning, laundry, shopping, community activity, and yard work  [place], Past/current experiences, Time since onset of injury/illness/exacerbation, and 3+ comorbidities: HTN, Migraine, PTSD, Acute CVA 03/04/20   are also affecting patient's functional outcome.   REHAB POTENTIAL: Good  CLINICAL DECISION MAKING: Stable/uncomplicated  EVALUATION COMPLEXITY: Low   GOALS: Goals reviewed with patient? Yes  SHORT TERM GOALS: Target date: 12/06/2021  Patient will be independent with HEP in order to improve functional outcomes. Baseline:  Goal status: MET  2.  Patient will report at least 25% improvement in symptoms for improved quality of life. Baseline:  Goal status: MET    LONG TERM GOALS: Target date: 12/27/2021  Patient will report at least 75% improvement in symptoms for improved quality of life. Baseline:  Goal status: IN PROGRESS  2.  Patient will be able to complete 5x STS in under 14 seconds in order to reduce the risk of falls. Baseline:  Goal status: IN PROGRESS  3.  Patient will be able to ambulate at least 250 feet in 2MWT in order to demonstrate improved gait speed for community ambulation.  Baseline: 195 feet Goal status: MET  4.  Patient will improve LEFS score by at least 9 points in order to indicate improved tolerance to activity. Baseline: 17/80 Goal status: MET  5.  Patient will demonstrate grade of 4+/5  MMT grade in all tested musculature as evidence of improved strength to assist with stair ambulation and gait.   Baseline: see MMT Goal status: IN PROGRESS     PLAN: PT FREQUENCY: 2x/week  PT DURATION: 4 weeks  PLANNED INTERVENTIONS: Therapeutic exercises, Therapeutic activity, Neuromuscular re-education, Balance training, Gait training, Patient/Family education, Joint manipulation, Joint mobilization, Stair training, Orthotic/Fit training, DME instructions, Aquatic Therapy, Dry Needling, Electrical stimulation, Spinal manipulation, Spinal mobilization, Cryotherapy, Moist heat, Compression bandaging, scar mobilization, Splintting, Taping, Traction, Ultrasound, Ionotophoresis 49m/ml Dexamethasone, and Manual therapy  PLAN FOR NEXT SESSION:  hamstring length, hip strength, balance training functional strength, progress as able   ATeena Irani PTA/CLT CAdwolfPh: [phone] FTeena Irani  PTA 12/28/2021, 12:17 PM

## 2021-12-30 ENCOUNTER — Ambulatory Visit (HOSPITAL_COMMUNITY): Payer: Medicaid Other | Admitting: Physical Therapy

## 2021-12-30 DIAGNOSIS — R29818 Other symptoms and signs involving the nervous system: Secondary | ICD-10-CM

## 2021-12-30 DIAGNOSIS — R2689 Other abnormalities of gait and mobility: Secondary | ICD-10-CM | POA: Diagnosis not present

## 2021-12-30 DIAGNOSIS — M25551 Pain in right hip: Secondary | ICD-10-CM | POA: Diagnosis not present

## 2021-12-30 DIAGNOSIS — M6281 Muscle weakness (generalized): Secondary | ICD-10-CM

## 2021-12-30 NOTE — Addendum Note (Signed)
Addended by: Mearl Latin on: 12/30/2021 07:44 AM   Modules accepted: Orders

## 2021-12-30 NOTE — Therapy (Signed)
OUTPATIENT PHYSICAL THERAPY LOWER EXTREMITY TREATMENT     Patient Name: DEALIE KOELZER MRN: [id number] DOB:07/02/1975, 46 y.o., female Today's Date: 12/30/2021   PT End of Session - 12/30/21 1204     Visit Number 7    Number of Visits 12    Date for PT Re-Evaluation 01/25/22    Authorization Type Medicaid [employer]    Authorization Time Period 7 visits approved 6/19-->01/13/22; requesting 8 additional visits - check auth    Authorization - Visit Number 7    Authorization - Number of Visits 7    Progress Note Due on Visit 16    PT Start Time 1120    PT Stop Time 1203    PT Time Calculation (min) 43 min    Activity Tolerance Patient tolerated treatment well    Behavior During Therapy 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   Hypertension    Lumbar radiculitis 06/23/2021   Migraine    PTSD (post-traumatic stress disorder) 05/01/2017   Round ligament pain 11/12/2014   Past Surgical History:  Procedure Laterality Date   CHOLECYSTECTOMY     Patient Active Problem List   Diagnosis Date Noted   Hamstring tendinitis at origin 10/13/2021   Lumbar radiculitis 06/23/2021   Left lateral epicondylitis 06/23/2021   Chronic left-sided low back pain without sciatica 07/01/2020   Neuropathic pain 05/06/2020   Anxiety state    Dyslipidemia    Migraine without status migrainosus, not intractable    History of stroke 03/10/2020   Left hemiparesis ([place]) 03/10/2020   Recurrent falls 03/05/2020   MDD (major depressive disorder), recurrent episode, moderate ([place]) 05/01/2017   PTSD (post-traumatic stress disorder) 05/01/2017   Essential hypertension 07/20/2016   Uterine fibroid 01/27/2016   Cervical high risk HPV (human papillomavirus) test positive 12/08/2014    PCP: Halina Maidens MD  REFERRING PROVIDER: Meredith Staggers, MD   REFERRING DIAG: G81.94 (ICD-10-CM) - Left hemiparesis ([place]) M79.2 (ICD-10-CM) - Neuropathic pain M76.899 (ICD-10-CM) - Hamstring tendinitis at origin   THERAPY DIAG:  Other abnormalities of gait and mobility  Muscle weakness (generalized)  Other symptoms and signs involving the nervous system  Pain in right hip  Other abnormalities of gait and mobility   Muscle weakness (generalized)   Other symptoms and signs involving the nervous system   Pain in right hip  Rationale for Evaluation and Treatment Rehabilitation  ONSET DATE: December 2022  SUBJECTIVE:   SUBJECTIVE STATEMENT:  Pt states that she is doing her exercises daily.  Her Pain is better  PERTINENT HISTORY: HTN, Migraine, PTSD, CVA 03/04/20   PAIN:  Are you having pain? 7/10 Yes from her LT buttock to her knee    PLOF: Independent  PATIENT GOALS feel better   OBJECTIVE:   PATIENT SURVEYS:  LEFS 12/28/21:  51/80 ( was 17/80 at evaluation)    POSTURE: rounded shoulders and forward head  PALPATION: TTP R glute max, glute med/min, proximal hamstring; no tenderness throughout rest of leg  LOWER EXTREMITY ROM:  decreased hip extension bilaterally, pain with TKE on R in R knee and hamstring  LOWER EXTREMITY MMT:  MMT Right eval Right 12/28/21 Left eval Left  12/28/21  Hip flexion 4/5 4+ 4/5 4+  Hip extension  2+/5 3- 2+/5 3+  Hip abduction 4/5 4+ 4-/5 4  Hip adduction      Hip internal rotation      Hip external rotation      Knee flexion 4/5 4+ 4+/5 5  Knee extension 4/5 4+ 4+/5 5  Ankle dorsiflexion 4+/5 5 4+/5 5  Ankle plantarflexion      Ankle inversion      Ankle eversion       (Blank rows = not tested)   FUNCTIONAL TESTS:  5 times sit to stand: 12/28/21:  18.8 seconds (was 22.28 seconds without UE at evaluation) 2 minute walk test: 12/28/21:  412 feet no AD (was 195 feet at evaluation)  GAIT: 12/30/21: Standing:  Hip excursion x  3 Heel raises x 15 Hip abduction with green thera-band x 15 each  Hip extension with green thera-band x 15 each  Functional squat x 5 Sitting: Sit to stand x 10  Supine:   Bridge x 15  Knee to chest x 3 Hamstring stretch x 3  Piriformis stretch x 3  Glut and abdominal set hold x 5" x 10 rep each  NUSTEP X 5 MINUTES   12/28/21: Distance walked: 412 feet Assistive device utilized: no AD Level of assistance: Independent Comments: 2MWT no gait deviations  Evaluation:  Distance walked: 195 feet Assistive device utilized: Environmental consultant - 4 wheeled Level of assistance: Modified independence Comments: 2MWT, antalgic on RLE with increased UE support used on R stance    TODAY'S TREATMENT: 12/16/21 SKTC stretch 3x 20 second holds RLE Supine active hamstring stretch 10x 10 second holds RLE Bridge 3x 10  Supine hamstring isometric 10 x 10 second holds DKTC with red ball 2x 10 5 second holds Prone hip extension 2x 10 bilateral  Prone heel squeeze 10 x 5 second holds Prone hamstring curl 2x 10 bilateral Step up 4 inch 1x 10 bilateral   12/09/21:   PATIENT EDUCATION:  Education details: 11/17/21: Reviewed goals, educated importance of HEP compliance for maximal benefits 6/19 eval: Patient educated on exam findings, POC, scope of PT, HEP. Person educated: Patient Education method: Explanation, Demonstration, and Handouts Education comprehension: verbalized understanding, returned demonstration, verbal cues required, and tactile cues required   HOME EXERCISE PROGRAM: 12/30/21 Added standing heel raises, standing resisted hip abduction/hip extension.   6/19/23Access Code: [email or web address] - Supine Hamstring Stretch (Mirrored)  - 2-3 x daily - 7 x weekly - 10 reps - 5-10 second hold - Supine Isometric Hamstring Set  - 2-3 x daily - 7 x weekly - 10 reps - 10 second hold  11/17/21: STS, bridge, discussed walking program  7/20 prone hamstring curl, hip extension, walking  program   ASSESSMENT:  CLINICAL IMPRESSION: Therapist began standing exercises with pt and updated HEP.  Pt hip mm fatigue quickly   Patient will continue to benefit from physical therapy in order to improve function and reduce impairment.Therapist last session requested more visits therapist this session instructed to make one appointment towards the later part of next week then 2x a week for the next 3 weeks.     OBJECTIVE IMPAIRMENTS Abnormal gait, decreased balance, decreased endurance, decreased mobility, difficulty walking, decreased ROM, decreased strength, increased muscle spasms, improper body mechanics, and pain.   ACTIVITY LIMITATIONS lifting, bending, standing, squatting, sleeping, stairs, transfers, bed mobility, toileting, locomotion level, and caring for others  PARTICIPATION LIMITATIONS: cleaning, laundry, shopping, community activity, and yard work  [place], Past/current experiences, Time since onset of injury/illness/exacerbation, and 3+ comorbidities: HTN,  Migraine, PTSD, Acute CVA 03/04/20   are also affecting patient's functional outcome.   REHAB POTENTIAL: Good  CLINICAL DECISION MAKING: Stable/uncomplicated  EVALUATION COMPLEXITY: Low   GOALS: Goals reviewed with patient? Yes  SHORT TERM GOALS: Target date: 12/06/2021  Patient will be independent with HEP in order to improve functional outcomes. Baseline:  Goal status: MET  2.  Patient will report at least 25% improvement in symptoms for improved quality of life. Baseline:  Goal status: MET    LONG TERM GOALS: Target date: 12/27/2021  Patient will report at least 75% improvement in symptoms for improved quality of life. Baseline:  Goal status: IN PROGRESS  2.  Patient will be able to complete 5x STS in under 14 seconds in order to reduce the risk of falls. Baseline:  Goal status: IN PROGRESS  3.  Patient will be able to ambulate at least 250 feet in 2MWT in order to demonstrate  improved gait speed for community ambulation.  Baseline: 195 feet Goal status: MET  4.  Patient will improve LEFS score by at least 9 points in order to indicate improved tolerance to activity. Baseline: 17/80 Goal status: MET  5.  Patient will demonstrate grade of 4+/5 MMT grade in all tested musculature as evidence of improved strength to assist with stair ambulation and gait.   Baseline: see MMT Goal status: IN PROGRESS     PLAN: PT FREQUENCY: 2x/week  PT DURATION: 4 weeks  PLANNED INTERVENTIONS: Therapeutic exercises, Therapeutic activity, Neuromuscular re-education, Balance training, Gait training, Patient/Family education, Joint manipulation, Joint mobilization, Stair training, Orthotic/Fit training, DME instructions, Aquatic Therapy, Dry Needling, Electrical stimulation, Spinal manipulation, Spinal mobilization, Cryotherapy, Moist heat, Compression bandaging, scar mobilization, Splintting, Taping, Traction, Ultrasound, Ionotophoresis 58m/ml Dexamethasone, and Manual therapy  PLAN FOR NEXT SESSION:  hamstring length, hip strength, balance training functional strength, progress as able CRayetta Humphrey PT CLT [phone] 12/30/2021, 12:03 AM

## 2022-01-04 DIAGNOSIS — F410 Panic disorder [episodic paroxysmal anxiety] without agoraphobia: Secondary | ICD-10-CM | POA: Diagnosis not present

## 2022-01-04 DIAGNOSIS — F411 Generalized anxiety disorder: Secondary | ICD-10-CM | POA: Diagnosis not present

## 2022-01-04 DIAGNOSIS — F331 Major depressive disorder, recurrent, moderate: Secondary | ICD-10-CM | POA: Diagnosis not present

## 2022-01-05 ENCOUNTER — Encounter: Payer: Medicaid Other | Attending: Physical Medicine & Rehabilitation | Admitting: Physical Medicine & Rehabilitation

## 2022-01-05 ENCOUNTER — Encounter: Payer: Self-pay | Admitting: Physical Medicine & Rehabilitation

## 2022-01-05 VITALS — BP 148/97 | HR 82 | Ht 65.0 in | Wt 235.4 lb

## 2022-01-05 DIAGNOSIS — M5416 Radiculopathy, lumbar region: Secondary | ICD-10-CM | POA: Diagnosis not present

## 2022-01-05 DIAGNOSIS — G43409 Hemiplegic migraine, not intractable, without status migrainosus: Secondary | ICD-10-CM | POA: Insufficient documentation

## 2022-01-05 DIAGNOSIS — M792 Neuralgia and neuritis, unspecified: Secondary | ICD-10-CM | POA: Diagnosis not present

## 2022-01-05 DIAGNOSIS — G8194 Hemiplegia, unspecified affecting left nondominant side: Secondary | ICD-10-CM | POA: Diagnosis not present

## 2022-01-05 MED ORDER — TOPIRAMATE 50 MG PO TABS: 50.0000 mg | ORAL_TABLET | Freq: Two times a day (BID) | ORAL | 3 refills | Status: DC

## 2022-01-05 MED ORDER — GABAPENTIN 600 MG PO TABS: 600.0000 mg | ORAL_TABLET | Freq: Three times a day (TID) | ORAL | 5 refills | Status: DC

## 2022-01-05 NOTE — Progress Notes (Signed)
Subjective:    Patient ID: Joyce [name], female    DOB: February 12, 1976, 46 y.o.   MRN: [id number]  HPI  Joyce [name] is here in follow up of her thalamic infarct and associated deficits and low back pain.  She reports improvement in her pain after the prednisone taper as well as with the tizanidine.  She also notes that the increased gabapentin helps her left-sided dysesthesias quite a bit.  Therapy has made a difference as far as her balance is concerned.  She is also moved off the walker which seems to have helped with her mechanics.  She tries to stay active and walking is much as she can.  She only uses her walker now for longer distances.  She still has a handful of physical therapy sessions left.  Asked her about her shoe wear and she states that she has been looking at different tennis shoes although she sometimes struggle finding pairs that fit her as it tend to make her feet swell.  Pain Inventory Average Pain 6 Pain Right Now 5 My pain is aching  In the last 24 hours, has pain interfered with the following? General activity 0 Relation with others 0 Enjoyment of life 0 What TIME of day is your pain at its worst? night Sleep (in general) Poor  Pain is worse with: walking Pain improves with: therapy/exercise Relief from Meds: 0  Family History  Problem Relation Age of Onset   Diabetes Mother    Hypertension Mother    Kidney disease Mother    Asthma Mother    Hyperlipidemia Mother    Alzheimer's disease Father    Diabetes Father    Kidney disease Sister    Asthma Son    Diabetes Sister    Asthma Son    ADD / ADHD Son    Heart murmur Son    Asthma Son    Colon cancer Neg Hx    Colon polyps Neg Hx    Social History   Socioeconomic History   Marital status: Significant Other    Spouse name: [place]   Number of children: 8   Years of education: 9   Highest education level: Not on file  Occupational History   Occupation: unemployed    Comment: disabled   Tobacco Use   Smoking status: Never   Smokeless tobacco: Never  Vaping Use   Vaping Use: Never used  Substance and Sexual Activity   Alcohol use: Never   Drug use: No   Sexual activity: Yes    Birth control/protection: I.U.D.    Comment: followed by GYN  Other Topics Concern   Not on file  Social History Narrative   Lives with fiance and kids   Right Handed   Drinks >12 cans of soda in caffeine   Social Determinants of Health   Financial Resource Strain: Low Risk  (10/06/2021)   Overall Financial Resource Strain (CARDIA)    Difficulty of Paying Living Expenses: Not hard at all  Food Insecurity: No Food Insecurity (10/06/2021)   Hunger Vital Sign    Worried About Running Out of Food in the Last Year: Never true    Ran Out of Food in the Last Year: Never true  Transportation Needs: No Transportation Needs (10/06/2021)   PRAPARE - Hydrologist (Medical): No    Lack of Transportation (Non-Medical): No  Physical Activity: Not on file  Stress: Not on file  Social Connections: Not on file  Past Surgical History:  Procedure Laterality Date   CHOLECYSTECTOMY     Past Surgical History:  Procedure Laterality Date   CHOLECYSTECTOMY     Past Medical History:  Diagnosis Date   Acute CVA (cerebrovascular accident) ([place]) 03/04/2020   Acute left-sided weakness 03/05/2020   Anemia    Depression    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   Hypertension    Lumbar radiculitis 06/23/2021   Migraine    PTSD (post-traumatic stress disorder) 05/01/2017   Round ligament pain 11/12/2014   BP (!) 148/97   Pulse 82   Ht '5\' 5"'$  (1.651 m)   Wt 235 lb 6.4 oz (106.8 kg)   SpO2 99%   BMI 39.17 kg/m   Opioid Risk Score:   Fall Risk Score:  `1  Depression screen [facility] 2/9     01/05/2022    3:06 PM 10/13/2021    2:06 PM 10/06/2021    2:39 PM 06/23/2021    2:52 PM 04/20/2021    3:34 PM 11/23/2020    9:37 AM 10/21/2020   10:10 AM  Depression screen PHQ 2/9  Decreased  Interest 0 '2 3 2 '$ 0 0 0  Down, Depressed, Hopeless 0 '2 3 2 '$ 0 0 0  PHQ - 2 Score 0 '4 6 4 '$ 0 0 0  Altered sleeping   3 2 0    Tired, decreased energy   2  0    Change in appetite   0  0    Feeling bad or failure about yourself    2  0    Trouble concentrating   3  0    Moving slowly or fidgety/restless   2  0    Suicidal thoughts   0  0    PHQ-9 Score   18 6 0    Difficult doing work/chores   Very difficult  Not difficult at all       Review of Systems  Musculoskeletal:  Positive for back pain.      Objective:   Physical Exam  General: No acute distress, obese HEENT: NCAT, EOMI, oral membranes moist Cards: reg rate  Chest: normal effort Abdomen: Soft, NT, ND Skin: dry, intact Extremities: no edema Psych: pleasant and appropriate  Neuro Alert and oriented x 3. Normal insight and awareness. Intact Memory. Normal language and speech. Cranial nerve exam unremarkable except for left facial sensory loss.  Sensory 1/2 LUE and LLE ongoing. Motor 4/5 on left 5/5 on right.  No significant changes here.  She does walk with reasonable balance and weight shift although she still favors the left side. Musculoskeletal: mild pain along the low back/ more easily able to bend. Minimal hamstring discomfort. Most of pain was in extension and with facet manuevers.wearing crocs today.        Assessment & Plan:  1.  Left-sided hemiparesis secondary to right thalamic infarction             -Secondary stroke prophylaxis with  aspirin 325 mg daily             -HEP discussed             2.  Pain management/history of migraines               topiramate '50mg'$  bid             -Gabapentin for neuropathic pain 300 mg 3 times daily                         -  Increased to '600mg'$  tid with improvement 3.  Mood:  Per primary, lexapro, xanax 4.  Spasticity: controlled Baclofen 5 mg 3 times daily----DC 5. GERD- protonix                         -stop nsaids other than ECASA                         -GI f/u as  directed 6. Low back pain-patient has spondylosis and facet arthropathy on her most recent x-ray from last year.  MRI really unremarkable.   -pain improved with meds/therapy/appropriate mechanics with gait  -discussed shoewear -continue with outpt PT to completion -tizanidine ongoing  -prednisone taper helpful 7.  Left elbow pain consistent with lateral epicondylitis             -Ice, elbow band/splint             -improved.    15 minutes of face to face patient care time were spent during this visit. All questions were encouraged and answered.  Follow up with me in 4 mos .

## 2022-01-05 NOTE — Patient Instructions (Addendum)
MAINTAIN SOME LEVEL OF EXERCISE DAILY  APPROPRIATE MECHANICS WHEN YOU WALK  GOOD SHOEWEAR  FINISH UP PHYSICAL THERAPY

## 2022-01-07 ENCOUNTER — Other Ambulatory Visit: Payer: Self-pay | Admitting: Internal Medicine

## 2022-01-07 MED ORDER — ESCITALOPRAM OXALATE 20 MG PO TABS: ORAL_TABLET | Freq: Every day | ORAL | 0 refills | Status: DC

## 2022-01-07 MED ORDER — AMLODIPINE BESYLATE 5 MG PO TABS: ORAL_TABLET | Freq: Every day | ORAL | 0 refills | Status: DC

## 2022-01-07 MED ORDER — ATORVASTATIN CALCIUM 80 MG PO TABS: ORAL_TABLET | Freq: Every day | ORAL | 0 refills | Status: DC

## 2022-01-10 ENCOUNTER — Encounter (HOSPITAL_COMMUNITY): Payer: Medicaid Other

## 2022-01-11 ENCOUNTER — Encounter: Payer: Self-pay | Admitting: Internal Medicine

## 2022-01-11 ENCOUNTER — Ambulatory Visit: Payer: Medicaid Other | Admitting: Internal Medicine

## 2022-01-11 ENCOUNTER — Other Ambulatory Visit: Payer: Self-pay | Admitting: Internal Medicine

## 2022-01-11 VITALS — BP 128/80 | HR 83 | Ht 65.0 in | Wt 236.0 lb

## 2022-01-11 DIAGNOSIS — Z1211 Encounter for screening for malignant neoplasm of colon: Secondary | ICD-10-CM | POA: Diagnosis not present

## 2022-01-11 DIAGNOSIS — I10 Essential (primary) hypertension: Secondary | ICD-10-CM | POA: Diagnosis not present

## 2022-01-11 DIAGNOSIS — E785 Hyperlipidemia, unspecified: Secondary | ICD-10-CM

## 2022-01-11 DIAGNOSIS — Z1231 Encounter for screening mammogram for malignant neoplasm of breast: Secondary | ICD-10-CM | POA: Diagnosis not present

## 2022-01-11 DIAGNOSIS — D649 Anemia, unspecified: Secondary | ICD-10-CM | POA: Diagnosis not present

## 2022-01-11 DIAGNOSIS — D509 Iron deficiency anemia, unspecified: Secondary | ICD-10-CM | POA: Insufficient documentation

## 2022-01-11 NOTE — Progress Notes (Signed)
Date:  01/11/2022   Name:  Joyce [name]   DOB:  07/23/1975   MRN:  [id number]   Chief Complaint: Hypertension and Hyperlipidemia  Hypertension This is a chronic problem. The problem is controlled. Associated symptoms include headaches (when BP is elevated). Pertinent negatives include no chest pain, palpitations or shortness of breath. Past treatments include calcium channel blockers. The current treatment provides significant improvement. There is no history of kidney disease, CAD/MI or CVA.  Hyperlipidemia This is a chronic problem. The problem is controlled. Associated symptoms include myalgias. Pertinent negatives include no chest pain or shortness of breath. Current antihyperlipidemic treatment includes statins. The current treatment provides significant improvement of lipids.    Lab Results  Component Value Date   NA 134 (L) 07/11/2021   K 3.4 (L) 07/11/2021   CO2 25 07/11/2021   GLUCOSE 87 07/11/2021   BUN 10 07/11/2021   CREATININE 0.76 07/11/2021   CALCIUM 8.4 (L) 07/11/2021   EGFR 111 11/20/2020   GFRNONAA >60 07/11/2021   Lab Results  Component Value Date   CHOL 125 11/20/2020   HDL 43 11/20/2020   LDLCALC 70 11/20/2020   TRIG 56 11/20/2020   CHOLHDL 2.7 08/06/2020   Lab Results  Component Value Date   TSH 0.523 03/06/2020   Lab Results  Component Value Date   HGBA1C 5.6 03/05/2020   Lab Results  Component Value Date   WBC 8.3 07/11/2021   HGB 10.8 (L) 07/11/2021   HCT 35.5 (L) 07/11/2021   MCV 68.8 (L) 07/11/2021   PLT 323 07/11/2021   Lab Results  Component Value Date   ALT 19 07/11/2021   AST 18 07/11/2021   ALKPHOS 45 07/11/2021   BILITOT 0.2 (L) 07/11/2021   Lab Results  Component Value Date   VD25OH 33 02/15/2017     Review of Systems  Constitutional:  Negative for chills, fatigue and fever.  Respiratory:  Negative for cough, chest tightness and shortness of breath.   Cardiovascular:  Negative for chest pain, palpitations and  leg swelling.  Gastrointestinal:  Negative for abdominal pain, constipation and diarrhea.  Musculoskeletal:  Positive for arthralgias, back pain, gait problem and myalgias.  Neurological:  Positive for headaches (when BP is elevated). Negative for dizziness and light-headedness.  Psychiatric/Behavioral:  Positive for dysphoric mood and sleep disturbance. The patient is nervous/anxious.     Patient Active Problem List   Diagnosis Date Noted   Hamstring tendinitis at origin 10/13/2021   Lumbar radiculitis 06/23/2021   Left lateral epicondylitis 06/23/2021   Chronic left-sided low back pain without sciatica 07/01/2020   Neuropathic pain 05/06/2020   Anxiety state    Dyslipidemia    Migraine without status migrainosus, not intractable    History of stroke 03/10/2020   Left hemiparesis ([place]) 03/10/2020   Recurrent falls 03/05/2020   MDD (major depressive disorder), recurrent episode, moderate ([place]) 05/01/2017   PTSD (post-traumatic stress disorder) 05/01/2017   Essential hypertension 07/20/2016   Uterine fibroid 01/27/2016   Cervical high risk HPV (human papillomavirus) test positive 12/08/2014    No Known Allergies  Past Surgical History:  Procedure Laterality Date   CHOLECYSTECTOMY      Social History   Tobacco Use   Smoking status: Never   Smokeless tobacco: Never  Vaping Use   Vaping Use: Never used  Substance Use Topics   Alcohol use: Never   Drug use: No     Medication list has been reviewed and updated.  Current Meds  Medication Sig   ALPRAZolam (XANAX) 0.25 MG tablet Take 0.25 mg by mouth daily as needed.   amLODipine (NORVASC) 5 MG tablet TAKE 1 TABLET (5 MG TOTAL) BY MOUTH DAILY.   ARIPiprazole (ABILIFY) 5 MG tablet Take 5 mg by mouth daily.   aspirin 325 MG EC tablet Take 1 tablet (325 mg total) by mouth daily.   atorvastatin (LIPITOR) 80 MG tablet TAKE 1 TABLET (80 MG TOTAL) BY MOUTH DAILY.   buPROPion (WELLBUTRIN XL) 300 MG 24 hr tablet Take 300 mg by  mouth every morning.   busPIRone (BUSPAR) 10 MG tablet Take 10 mg by mouth 2 (two) times daily.   cyclobenzaprine (FLEXERIL) 10 MG tablet Take 1 tablet (10 mg total) by mouth 2 (two) times daily as needed for muscle spasms.   escitalopram (LEXAPRO) 20 MG tablet TAKE 1 TABLET (20 MG TOTAL) BY MOUTH DAILY.   fluticasone (FLONASE) 50 MCG/ACT nasal spray Use 2 sprays in each nostril BID for a week. After 1 week, decrease to 1 spray in each nostril BID as needed for congestion/allergies.   gabapentin (NEURONTIN) 600 MG tablet Take 1 tablet (600 mg total) by mouth 3 (three) times daily.   ibuprofen (ADVIL) 600 MG tablet Take 1 tablet (600 mg total) by mouth every 6 (six) hours as needed.   methocarbamol (ROBAXIN) 750 MG tablet Take 750 mg by mouth at bedtime as needed.   pantoprazole (PROTONIX) 40 MG tablet Take 1 tablet (40 mg total) by mouth daily.   PARAGARD INTRAUTERINE COPPER IU by Intrauterine route.   predniSONE (DELTASONE) 20 MG tablet Take 1 tablet (20 mg total) by mouth daily with breakfast. Take 1 tab 3x daily for 4 days then 1 tab 2x daily for 4 days then 1 tab once daily for 4 days, then 1/2 tab daily for 4 days and off.   tiZANidine (ZANAFLEX) 2 MG tablet Take 1 tablet (2 mg total) by mouth 3 (three) times daily.   topiramate (TOPAMAX) 50 MG tablet Take 1 tablet (50 mg total) by mouth 2 (two) times daily. Start 1 tablet daily x 1 week and then twice daily   traZODone (DESYREL) 100 MG tablet Take 100 mg by mouth at bedtime.   trazodone (DESYREL) 300 MG tablet Take 300 mg by mouth at bedtime.       01/11/2022    3:06 PM 10/06/2021    2:39 PM  GAD 7 : Generalized Anxiety Score  Nervous, Anxious, on Edge 3 3  Control/stop worrying 3 3  Worry too much - different things 3 3  Trouble relaxing 3 3  Restless 3 2  Easily annoyed or irritable 3 3  Afraid - awful might happen 3 1  Total GAD 7 Score 21 18  Anxiety Difficulty Extremely difficult Extremely difficult       01/11/2022    3:05  PM 01/05/2022    3:06 PM 10/13/2021    2:06 PM  Depression screen PHQ 2/9  Decreased Interest 3 0 2  Down, Depressed, Hopeless 3 0 2  PHQ - 2 Score 6 0 4  Altered sleeping 3    Tired, decreased energy 3    Change in appetite 0    Feeling bad or failure about yourself  1    Trouble concentrating 3    Moving slowly or fidgety/restless 0    Suicidal thoughts 0    PHQ-9 Score 16    Difficult doing work/chores Very difficult      BP Readings from  Last 3 Encounters:  01/11/22 128/80  01/05/22 (!) 148/97  10/13/21 138/84    Physical Exam Vitals and nursing note reviewed.  Constitutional:      General: She is not in acute distress.    Appearance: She is well-developed. She is obese.  HENT:     Head: Normocephalic and atraumatic.  Neck:     Vascular: No carotid bruit.  Cardiovascular:     Rate and Rhythm: Normal rate and regular rhythm.     Pulses: Normal pulses.     Heart sounds: No murmur heard. Pulmonary:     Effort: Pulmonary effort is normal. No respiratory distress.     Breath sounds: No wheezing or rhonchi.  Musculoskeletal:     Cervical back: Normal range of motion.     Right lower leg: No edema.     Left lower leg: No edema.  Lymphadenopathy:     Cervical: No cervical adenopathy.  Skin:    General: Skin is warm and dry.     Findings: No rash.  Neurological:     Mental Status: She is alert and oriented to person, place, and time. Mental status is at baseline.     Gait: Gait abnormal.  Psychiatric:        Mood and Affect: Mood normal.        Behavior: Behavior normal.     Wt Readings from Last 3 Encounters:  01/11/22 236 lb (107 kg)  01/05/22 235 lb 6.4 oz (106.8 kg)  10/13/21 239 lb (108.4 kg)    BP 128/80   Pulse 83   Ht _0  (1.651 m)   Wt 236 lb (107 kg)   SpO2 96%   BMI 39.27 kg/m   Assessment and Plan: 1. Essential hypertension Clinically stable exam with well controlled BP. Tolerating medications without side effects at this time. Pt to  continue current regimen and low sodium diet;  - Comprehensive metabolic panel  2. Dyslipidemia Tolerating statin medication without side effects at this time LDL is at goal of < 70 on current dose Continue same therapy without change at this time. - Comprehensive metabolic panel - Lipid panel  3. Anemia, unspecified type Hx of anemia for years - never evaluated - CBC with Differential/Platelet - Iron, TIBC and Ferritin Panel - Vitamin B12  4. Colon cancer screening Due for screening - Ambulatory referral to Gastroenterology  5. Encounter for screening mammogram for breast cancer Schedule at [place]   Partially dictated using Editor, commissioning. Any errors are unintentional.  Halina Maidens, MD [facility]  01/11/2022

## 2022-01-12 ENCOUNTER — Telehealth: Payer: Self-pay

## 2022-01-12 ENCOUNTER — Ambulatory Visit (HOSPITAL_COMMUNITY): Payer: Medicaid Other | Admitting: Physical Therapy

## 2022-01-12 ENCOUNTER — Other Ambulatory Visit: Payer: Self-pay

## 2022-01-12 DIAGNOSIS — R29818 Other symptoms and signs involving the nervous system: Secondary | ICD-10-CM

## 2022-01-12 DIAGNOSIS — Z1211 Encounter for screening for malignant neoplasm of colon: Secondary | ICD-10-CM

## 2022-01-12 DIAGNOSIS — M25551 Pain in right hip: Secondary | ICD-10-CM | POA: Diagnosis not present

## 2022-01-12 DIAGNOSIS — R2689 Other abnormalities of gait and mobility: Secondary | ICD-10-CM | POA: Diagnosis not present

## 2022-01-12 DIAGNOSIS — M6281 Muscle weakness (generalized): Secondary | ICD-10-CM

## 2022-01-12 MED ORDER — NA SULFATE-K SULFATE-MG SULF 17.5-3.13-1.6 GM/177ML PO SOLN: 1.0000 | Freq: Once | ORAL | 0 refills | Status: AC

## 2022-01-12 NOTE — Telephone Encounter (Signed)
Gastroenterology Pre-Procedure Review  Request Date: 02/09/22 Requesting Physician: Dr. Marius Ditch  PATIENT REVIEW QUESTIONS: The patient responded to the following health history questions as indicated:    1. Are you having any GI issues? no 2. Do you have a personal history of Polyps? no 3. Do you have a family history of Colon Cancer or Polyps? no 4. Diabetes Mellitus? no 5. Joint replacements in the past 12 months?no 6. Major health problems in the past 3 months?no 7. Any artificial heart valves, MVP, or defibrillator?no    MEDICATIONS & ALLERGIES:    Patient reports the following regarding taking any anticoagulation/antiplatelet therapy:   Plavix, Coumadin, Eliquis, Xarelto, Lovenox, Pradaxa, Brilinta, or Effient? no Aspirin? yes (325 mg daily prescribedby physical therapist stroke  about 2-3 years ago) Blood thinner advice sent to Dr. Alger Simons  Patient confirms/reports the following medications:  Current Outpatient Medications  Medication Sig Dispense Refill   ALPRAZolam (XANAX) 0.25 MG tablet Take 0.25 mg by mouth daily as needed.     amLODipine (NORVASC) 5 MG tablet TAKE 1 TABLET (5 MG TOTAL) BY MOUTH DAILY. 30 tablet 0   ARIPiprazole (ABILIFY) 5 MG tablet Take 5 mg by mouth daily.     aspirin 325 MG EC tablet Take 1 tablet (325 mg total) by mouth daily. 100 tablet 0   atorvastatin (LIPITOR) 80 MG tablet TAKE 1 TABLET (80 MG TOTAL) BY MOUTH DAILY. 30 tablet 0   buPROPion (WELLBUTRIN XL) 300 MG 24 hr tablet Take 300 mg by mouth every morning.     busPIRone (BUSPAR) 10 MG tablet Take 10 mg by mouth 2 (two) times daily.     cyclobenzaprine (FLEXERIL) 10 MG tablet Take 1 tablet (10 mg total) by mouth 2 (two) times daily as needed for muscle spasms. 20 tablet 0   escitalopram (LEXAPRO) 20 MG tablet TAKE 1 TABLET (20 MG TOTAL) BY MOUTH DAILY. 20 tablet 0   fluticasone (FLONASE) 50 MCG/ACT nasal spray Use 2 sprays in each nostril BID for a week. After 1 week, decrease to 1 spray in  each nostril BID as needed for congestion/allergies. 16 g 6   gabapentin (NEURONTIN) 600 MG tablet Take 1 tablet (600 mg total) by mouth 3 (three) times daily. 90 tablet 5   ibuprofen (ADVIL) 600 MG tablet Take 1 tablet (600 mg total) by mouth every 6 (six) hours as needed. 30 tablet 0   methocarbamol (ROBAXIN) 750 MG tablet Take 750 mg by mouth at bedtime as needed.     pantoprazole (PROTONIX) 40 MG tablet Take 1 tablet (40 mg total) by mouth daily. 30 tablet 5   PARAGARD INTRAUTERINE COPPER IU by Intrauterine route.     predniSONE (DELTASONE) 20 MG tablet Take 1 tablet (20 mg total) by mouth daily with breakfast. Take 1 tab 3x daily for 4 days then 1 tab 2x daily for 4 days then 1 tab once daily for 4 days, then 1/2 tab daily for 4 days and off. 26 tablet 0   tiZANidine (ZANAFLEX) 2 MG tablet Take 1 tablet (2 mg total) by mouth 3 (three) times daily. 90 tablet 3   topiramate (TOPAMAX) 50 MG tablet Take 1 tablet (50 mg total) by mouth 2 (two) times daily. Start 1 tablet daily x 1 week and then twice daily 60 tablet 3   traZODone (DESYREL) 100 MG tablet Take 100 mg by mouth at bedtime.     trazodone (DESYREL) 300 MG tablet Take 300 mg by mouth at bedtime.  No current facility-administered medications for this visit.    Patient confirms/reports the following allergies:  No Known Allergies  No orders of the defined types were placed in this encounter.   AUTHORIZATION INFORMATION Primary Insurance: 1D#: Group #:  Secondary Insurance: 1D#: Group #:  SCHEDULE INFORMATION: Date:  Time: Location:

## 2022-01-12 NOTE — Therapy (Signed)
OUTPATIENT PHYSICAL THERAPY LOWER EXTREMITY TREATMENT     Patient Name: Joyce [name] MRN: [id number] DOB:12-01-1975, 46 y.o., female Today's Date: 01/12/2022   PT End of Session - 01/12/22 1346     Visit Number 8    Number of Visits 12    Date for PT Re-Evaluation 01/25/22    Authorization Type Medicaid [employer]    Authorization Time Period 7 visits approved 6/19-->01/13/22; 5 more visits approved 12/28/21-02/25/22    Authorization - Visit Number 1    Authorization - Number of Visits 5    Progress Note Due on Visit 16    PT Start Time 1350    PT Stop Time 1430    PT Time Calculation (min) 40 min    Activity Tolerance Patient tolerated treatment well    Behavior During Therapy [facility] for tasks assessed/performed              Past Medical History:  Diagnosis Date   Acute CVA (cerebrovascular accident) ([place]) 03/04/2020   Acute left-sided weakness 03/05/2020   Anemia    Depression    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   Hypertension    Lumbar radiculitis 06/23/2021   Migraine    PTSD (post-traumatic stress disorder) 05/01/2017   Round ligament pain 11/12/2014   Past Surgical History:  Procedure Laterality Date   CHOLECYSTECTOMY     Patient Active Problem List   Diagnosis Date Noted   Anemia 01/11/2022   Hamstring tendinitis at origin 10/13/2021   Lumbar radiculitis 06/23/2021   Left lateral epicondylitis 06/23/2021   Chronic left-sided low back pain without sciatica 07/01/2020   Neuropathic pain 05/06/2020   Anxiety state    Dyslipidemia    Migraine without status migrainosus, not intractable    History of stroke 03/10/2020   Left hemiparesis ([place]) 03/10/2020   Recurrent falls 03/05/2020   MDD (major depressive disorder), recurrent episode, moderate ([place]) 05/01/2017   PTSD (post-traumatic stress disorder) 05/01/2017   Essential hypertension 07/20/2016   Uterine fibroid 01/27/2016   Cervical high risk HPV (human papillomavirus) test positive 12/08/2014     PCP: Halina Maidens MD  REFERRING PROVIDER: Meredith Staggers, MD   REFERRING DIAG: G81.94 (ICD-10-CM) - Left hemiparesis ([place]) M79.2 (ICD-10-CM) - Neuropathic pain M76.899 (ICD-10-CM) - Hamstring tendinitis at origin   THERAPY DIAG:  Other abnormalities of gait and mobility  Muscle weakness (generalized)  Other symptoms and signs involving the nervous system  Other abnormalities of gait and mobility   Muscle weakness (generalized)   Other symptoms and signs involving the nervous system   Pain in right hip  Rationale for Evaluation and Treatment Rehabilitation  ONSET DATE: December 2022  SUBJECTIVE:   SUBJECTIVE STATEMENT:  Pt reports 4/10 pain in Lt hip today.  States that she is overall better, compliant with HEP   PERTINENT HISTORY: HTN, Migraine, PTSD, CVA 03/04/20   PAIN:  Are you having pain? 7/10 Yes from her LT buttock to her knee    PLOF: Independent  PATIENT GOALS feel better   OBJECTIVE:   PATIENT SURVEYS:  LEFS 12/28/21:  51/80 ( was 17/80 at evaluation)    POSTURE: rounded shoulders and forward head  PALPATION: TTP R glute max, glute med/min, proximal hamstring; no tenderness throughout rest of leg  LOWER EXTREMITY ROM:  decreased hip extension bilaterally, pain with TKE on R in R knee and hamstring  LOWER EXTREMITY MMT:  MMT Right eval Right 12/28/21 Left eval Left  12/28/21  Hip flexion 4/5 4+ 4/5 4+  Hip extension 2+/5 3- 2+/5 3+  Hip abduction 4/5 4+ 4-/5 4  Hip adduction      Hip internal rotation      Hip external rotation      Knee flexion 4/5 4+ 4+/5 5  Knee extension 4/5 4+ 4+/5 5  Ankle dorsiflexion 4+/5 5 4+/5 5  Ankle plantarflexion      Ankle inversion      Ankle eversion       (Blank rows = not tested)   FUNCTIONAL TESTS:  5 times sit to stand: 12/28/21:  18.8 seconds (was 22.28 seconds without UE at evaluation) 2 minute walk test: 12/28/21:  412 feet no AD (was 195 feet at evaluation)  GAIT:  12/28/21: Distance  walked: 412 feet Assistive device utilized: no AD Level of assistance: Independent Comments: 2MWT no gait deviations  Evaluation:  Distance walked: 195 feet Assistive device utilized: Environmental consultant - 4 wheeled Level of assistance: Modified independence Comments: 2MWT, antalgic on RLE with increased UE support used on R stance    TODAY'S TREATMENT: 01/12/22: Nustep lvl 3 5 minutes seat 8 Seated:  STS no UE standard chair Standing:  Hip excursion 10X each direction  Forward lunges onto 4" step no UE 10 reps each LE  Hip abduction 2X10 GTB  Hip extension 2X10 GTB  12/30/21: Standing:  Hip excursion x 3 Heel raises x 15 Hip abduction with green thera-band x 15 each  Hip extension with green thera-band x 15 each  Functional squat x 5 Sitting: Sit to stand x 10  Supine:   Bridge x 15  Knee to chest x 3 Hamstring stretch x 3  Piriformis stretch x 3  Glut and abdominal set hold x 5" x 10 rep each  NUSTEP X 5 MINUTES   12/28/21: Progress note completed Functional test measures MMT/ROM testing  12/16/21 SKTC stretch 3x 20 second holds RLE Supine active hamstring stretch 10x 10 second holds RLE Bridge 3x 10  Supine hamstring isometric 10 x 10 second holds DKTC with red ball 2x 10 5 second holds Prone hip extension 2x 10 bilateral  Prone heel squeeze 10 x 5 second holds Prone hamstring curl 2x 10 bilateral Step up 4 inch 1x 10 bilateral    PATIENT EDUCATION:  Education details: 11/17/21: Reviewed goals, educated importance of HEP compliance for maximal benefits 6/19 eval: Patient educated on exam findings, POC, scope of PT, HEP. Person educated: Patient Education method: Explanation, Demonstration, and Handouts Education comprehension: verbalized understanding, returned demonstration, verbal cues required, and tactile cues required   HOME EXERCISE PROGRAM: 12/30/21 Added standing heel raises, standing resisted hip abduction/hip extension.   6/19/23Access Code: [email or web address] -  Supine Hamstring Stretch (Mirrored)  - 2-3 x daily - 7 x weekly - 10 reps - 5-10 second hold - Supine Isometric Hamstring Set  - 2-3 x daily - 7 x weekly - 10 reps - 10 second hold  11/17/21: STS, bridge, discussed walking program  7/20 prone hamstring curl, hip extension, walking program   ASSESSMENT:  CLINICAL IMPRESSION: Continued with focus on improving LE strength and stability.  Began with 3D hip excursion (AROM) following nustep in which time pt began c/o Rt hip pain, grasping her Rt hip.  Pt has been c/o Lt hip pain up to this point, however, when pointed out to patient reported it has always been her Rt hip.  Pt then presented with antalgic gait following this and reported both hips bother her. Continued with standing LE strengthening with addition of  lunges.  Cues to keep toe in neutral with hip abduction to insure correct musculature being isolated.   Increased most to 2 sets of 10 reps.   Pt required frequent standing rest breaks as she reported "it's weak" referring to her LE's.  Encouraged to acquire a gym membership.  Pt with approval for 4 more sessions following today (5 total).  Pt will continue to benefit from LE strengthening and activity tolerance.    OBJECTIVE IMPAIRMENTS Abnormal gait, decreased balance, decreased endurance, decreased mobility, difficulty walking, decreased ROM, decreased strength, increased muscle spasms, improper body mechanics, and pain.   ACTIVITY LIMITATIONS lifting, bending, standing, squatting, sleeping, stairs, transfers, bed mobility, toileting, locomotion level, and caring for others  PARTICIPATION LIMITATIONS: cleaning, laundry, shopping, community activity, and yard work  [place], Past/current experiences, Time since onset of injury/illness/exacerbation, and 3+ comorbidities: HTN, Migraine, PTSD, Acute CVA 03/04/20   are also affecting patient's functional outcome.   REHAB POTENTIAL: Good  CLINICAL DECISION MAKING:  Stable/uncomplicated  EVALUATION COMPLEXITY: Low   GOALS: Goals reviewed with patient? Yes  SHORT TERM GOALS: Target date: 12/06/2021  Patient will be independent with HEP in order to improve functional outcomes. Baseline:  Goal status: MET  2.  Patient will report at least 25% improvement in symptoms for improved quality of life. Baseline:  Goal status: MET    LONG TERM GOALS: Target date: 12/27/2021  Patient will report at least 75% improvement in symptoms for improved quality of life. Baseline:  Goal status: IN PROGRESS  2.  Patient will be able to complete 5x STS in under 14 seconds in order to reduce the risk of falls. Baseline:  Goal status: IN PROGRESS  3.  Patient will be able to ambulate at least 250 feet in 2MWT in order to demonstrate improved gait speed for community ambulation.  Baseline: 195 feet Goal status: MET  4.  Patient will improve LEFS score by at least 9 points in order to indicate improved tolerance to activity. Baseline: 17/80 Goal status: MET  5.  Patient will demonstrate grade of 4+/5 MMT grade in all tested musculature as evidence of improved strength to assist with stair ambulation and gait.   Baseline: see MMT Goal status: IN PROGRESS     PLAN: PT FREQUENCY: 2x/week  PT DURATION: 4 weeks  PLANNED INTERVENTIONS: Therapeutic exercises, Therapeutic activity, Neuromuscular re-education, Balance training, Gait training, Patient/Family education, Joint manipulation, Joint mobilization, Stair training, Orthotic/Fit training, DME instructions, Aquatic Therapy, Dry Needling, Electrical stimulation, Spinal manipulation, Spinal mobilization, Cryotherapy, Moist heat, Compression bandaging, scar mobilization, Splintting, Taping, Traction, Ultrasound, Ionotophoresis 61m/ml Dexamethasone, and Manual therapy  PLAN FOR NEXT SESSION:  LE strengthening and activity tolerance.  Update HEP as needed to prepare for D/C X 4 more visits.  Inquire if pt joined a  gymn  ATeena Irani PTA/CLT CLive OakPh: [phone] 01/12/2022, 12:03 AM

## 2022-01-17 ENCOUNTER — Telehealth: Payer: Self-pay

## 2022-01-17 ENCOUNTER — Encounter (HOSPITAL_COMMUNITY): Payer: Medicaid Other | Admitting: Physical Therapy

## 2022-01-17 NOTE — Telephone Encounter (Signed)
Blood thinner advice received from Dr. Gaspar Cola office advising patient to hold Aspirin '325mg'$  7 days prior to scheduled colonoscopy 02/09/22.  Restart 2 days after procedure.  LVM for patient to call office back to advise.  Sent her a [employer] as well.  Thanks,  [place], [place]

## 2022-01-19 ENCOUNTER — Telehealth (HOSPITAL_COMMUNITY): Payer: Self-pay

## 2022-01-19 ENCOUNTER — Ambulatory Visit (HOSPITAL_COMMUNITY): Payer: Medicaid Other

## 2022-01-19 NOTE — Telephone Encounter (Signed)
She has another MD apptment she can not miss.Please cx.

## 2022-01-24 ENCOUNTER — Telehealth (HOSPITAL_COMMUNITY): Payer: Self-pay | Admitting: Physical Therapy

## 2022-01-24 ENCOUNTER — Encounter (HOSPITAL_COMMUNITY): Payer: Self-pay | Admitting: Physical Therapy

## 2022-01-24 ENCOUNTER — Encounter (HOSPITAL_COMMUNITY): Payer: Medicaid Other | Admitting: Physical Therapy

## 2022-01-24 NOTE — Therapy (Signed)
PHYSICAL THERAPY DISCHARGE SUMMARY  Visits from Start of Care: 8  Current functional level related to goals / functional outcomes: NA   Remaining deficits: NA   Education / Equipment: Patient called to say she is doing much better and would like to continue with HEP at home. Requesting self DC today.    Patient agrees to discharge. Patient goals were partially met. Patient is being discharged due to being pleased with the current functional level.   1:43 PM, 01/24/22 Josue Hector PT DPT  Physical Therapist with [facility]  [phone]

## 2022-01-24 NOTE — Telephone Encounter (Signed)
Called patient about missed visit today. Patient states she is doing much better and would like to continue with HEP at home and request DC from therapy. Cancelled further visits and DC patient.   1:39 PM, 01/24/22 Josue Hector PT DPT  Physical Therapist with [facility]  [phone]

## 2022-01-26 ENCOUNTER — Ambulatory Visit (HOSPITAL_COMMUNITY): Payer: Medicaid Other

## 2022-01-27 DIAGNOSIS — F331 Major depressive disorder, recurrent, moderate: Secondary | ICD-10-CM | POA: Diagnosis not present

## 2022-01-27 DIAGNOSIS — F410 Panic disorder [episodic paroxysmal anxiety] without agoraphobia: Secondary | ICD-10-CM | POA: Diagnosis not present

## 2022-01-27 DIAGNOSIS — F411 Generalized anxiety disorder: Secondary | ICD-10-CM | POA: Diagnosis not present

## 2022-02-03 ENCOUNTER — Encounter (HOSPITAL_COMMUNITY): Payer: Medicaid Other | Admitting: Physical Therapy

## 2022-02-09 ENCOUNTER — Telehealth: Payer: Self-pay | Admitting: Internal Medicine

## 2022-02-09 ENCOUNTER — Encounter
Admission: RE | Disposition: A | Discharge status: Home / Self Care | Payer: Self-pay | Source: Ambulatory Visit | Attending: Gastroenterology

## 2022-02-09 ENCOUNTER — Telehealth: Payer: Self-pay

## 2022-02-09 ENCOUNTER — Ambulatory Visit: Payer: Medicaid Other | Admitting: Anesthesiology

## 2022-02-09 ENCOUNTER — Ambulatory Visit
Admission: RE | Admit: 2022-02-09 | Discharge: 2022-02-09 | Disposition: A | Discharge status: Home / Self Care | Payer: Medicaid Other | Source: Ambulatory Visit | Attending: Gastroenterology | Admitting: Gastroenterology

## 2022-02-09 ENCOUNTER — Other Ambulatory Visit: Payer: Self-pay

## 2022-02-09 ENCOUNTER — Encounter: Payer: Self-pay | Admitting: Gastroenterology

## 2022-02-09 DIAGNOSIS — F32A Depression, unspecified: Secondary | ICD-10-CM

## 2022-02-09 DIAGNOSIS — I10 Essential (primary) hypertension: Secondary | ICD-10-CM | POA: Diagnosis not present

## 2022-02-09 DIAGNOSIS — Z1211 Encounter for screening for malignant neoplasm of colon: Secondary | ICD-10-CM | POA: Diagnosis not present

## 2022-02-09 DIAGNOSIS — F419 Anxiety disorder, unspecified: Secondary | ICD-10-CM | POA: Insufficient documentation

## 2022-02-09 DIAGNOSIS — Z8673 Personal history of transient ischemic attack (TIA), and cerebral infarction without residual deficits: Secondary | ICD-10-CM | POA: Insufficient documentation

## 2022-02-09 DIAGNOSIS — F418 Other specified anxiety disorders: Secondary | ICD-10-CM | POA: Diagnosis not present

## 2022-02-09 HISTORY — PX: COLONOSCOPY WITH PROPOFOL: SHX5780

## 2022-02-09 LAB — POCT PREGNANCY, URINE: Preg Test, Ur: NEGATIVE

## 2022-02-09 SURGERY — COLONOSCOPY WITH PROPOFOL
Anesthesia: General

## 2022-02-09 MED ORDER — PROPOFOL 500 MG/50ML IV EMUL
INTRAVENOUS | Status: DC | PRN
  Administered 2022-02-09: 165 ug/kg/min via INTRAVENOUS

## 2022-02-09 MED ORDER — SODIUM CHLORIDE 0.9 % IV SOLN
INTRAVENOUS | Status: DC
  Administered 2022-02-09: 1000 mL via INTRAVENOUS

## 2022-02-09 MED ORDER — PROPOFOL 1000 MG/100ML IV EMUL
INTRAVENOUS | Status: AC
  Filled 2022-02-09: qty 100

## 2022-02-09 MED ORDER — LIDOCAINE HCL (CARDIAC) PF 100 MG/5ML IV SOSY
PREFILLED_SYRINGE | INTRAVENOUS | Status: DC | PRN
  Administered 2022-02-09: 50 mg via INTRAVENOUS

## 2022-02-09 MED ORDER — LIDOCAINE HCL (PF) 2 % IJ SOLN
INTRAMUSCULAR | Status: AC
  Filled 2022-02-09: qty 5

## 2022-02-09 MED ORDER — PROPOFOL 10 MG/ML IV BOLUS
INTRAVENOUS | Status: DC | PRN
  Administered 2022-02-09: 90 mg via INTRAVENOUS

## 2022-02-09 NOTE — Anesthesia Procedure Notes (Signed)
Date/Time: 02/09/2022 9:40 AM  Performed by: Loletha Grayer, CRNAPre-anesthesia Checklist: Patient identified, Emergency Drugs available, Suction available, Patient being monitored and Timeout performed Patient Re-evaluated:Patient Re-evaluated prior to induction Oxygen Delivery Method: Simple face mask

## 2022-02-09 NOTE — Transfer of Care (Signed)
Immediate Anesthesia Transfer of Care Note  Patient: [name]  Procedure(s) Performed: COLONOSCOPY WITH PROPOFOL  Patient Location: Endoscopy Unit  Anesthesia Type:General  Level of Consciousness: awake  Airway & Oxygen Therapy: Patient Spontanous Breathing  Post-op Assessment: Report given to RN and Post -op Vital signs reviewed and stable  Post vital signs: Reviewed and stable  Last Vitals:  Vitals Value Taken Time  BP 134/81 02/09/22 0959  Temp    Pulse 75 02/09/22 0959  Resp 23 02/09/22 0959  SpO2 100 % 02/09/22 0959    Last Pain:  Vitals:   02/09/22 0852  TempSrc: Temporal  PainSc: 0-No pain         Complications: No notable events documented.

## 2022-02-09 NOTE — Anesthesia Postprocedure Evaluation (Signed)
Anesthesia Post Note  Patient: Joyce [name]  Procedure(s) Performed: COLONOSCOPY WITH PROPOFOL  Patient location during evaluation: Endoscopy Anesthesia Type: General Level of consciousness: patient remains intubated per anesthesia plan Pain management: pain level controlled Vital Signs Assessment: post-procedure vital signs reviewed and stable Respiratory status: spontaneous breathing, nonlabored ventilation, respiratory function stable and patient connected to nasal cannula oxygen Cardiovascular status: blood pressure returned to baseline and stable Postop Assessment: no apparent nausea or vomiting Anesthetic complications: no   No notable events documented.   Last Vitals:  Vitals:   02/09/22 1007 02/09/22 1017  BP: (!) 131/96   Pulse:    Resp: (!) 21   Temp:    SpO2:  100%    Last Pain:  Vitals:   02/09/22 1017  TempSrc:   PainSc: 0-No pain                 Ilene Qua

## 2022-02-09 NOTE — Telephone Encounter (Signed)
Referral Request - Has patient seen PCP for this complaint? no *If NO, is insurance requiring patient see PCP for this issue before PCP can refer them? Referral for which specialty: Behavioral health  Preferred provider/office: ? Reason for referral: [place]. She states Dr she already sees for his only prescribes meds, but she needs counseling. She wants to do meds and counseling together

## 2022-02-09 NOTE — Op Note (Signed)
[facility] Patient Name: Joyce [name] Procedure Date: 02/09/2022 9:34 AM MRN: [id number] Account #: [id number] Date of Birth: 06/20/1975 Admit Type: Outpatient Age: 46 Room: [facility] ENDO ROOM 4 Gender: Female Note Status: Finalized Instrument Name: Jasper Riling [id number] Procedure:             Colonoscopy Indications:           Screening for colorectal malignant neoplasm, This is                         the patient's first colonoscopy Providers:             Lin Landsman MD, MD Referring MD:          Halina Maidens, MD (Referring MD) Medicines:             General Anesthesia Complications:         No immediate complications. Estimated blood loss: None. Procedure:             Pre-Anesthesia Assessment:                        - Prior to the procedure, a History and Physical was                         performed, and patient medications and allergies were                         reviewed. The patient is competent. The risks and                         benefits of the procedure and the sedation options and                         risks were discussed with the patient. All questions                         were answered and informed consent was obtained.                         Patient identification and proposed procedure were                         verified by the physician, the nurse, the                         anesthesiologist, the anesthetist and the technician                         in the pre-procedure area in the procedure room in the                         endoscopy suite. Mental Status Examination: alert and                         oriented. Airway Examination: normal oropharyngeal                         airway and neck mobility. Respiratory Examination:  clear to auscultation. CV Examination: normal.                         Prophylactic Antibiotics: The patient does not require                          prophylactic antibiotics. Prior Anticoagulants: The                         patient has taken Naproxyn, last dose was 5 days prior                         to procedure. ASA Grade Assessment: III - A patient                         with severe systemic disease. After reviewing the                         risks and benefits, the patient was deemed in                         satisfactory condition to undergo the procedure. The                         anesthesia plan was to use general anesthesia.                         Immediately prior to administration of medications,                         the patient was re-assessed for adequacy to receive                         sedatives. The heart rate, respiratory rate, oxygen                         saturations, blood pressure, adequacy of pulmonary                         ventilation, and response to care were monitored                         throughout the procedure. The physical status of the                         patient was re-assessed after the procedure.                        After obtaining informed consent, the colonoscope was                         passed under direct vision. Throughout the procedure,                         the patient's blood pressure, pulse, and oxygen                         saturations were monitored continuously. The  Colonoscope was introduced through the anus and                         advanced to the the terminal ileum, with                         identification of the appendiceal orifice and IC                         valve. The colonoscopy was performed without                         difficulty. The patient tolerated the procedure well.                         The quality of the bowel preparation was evaluated                         using the BBPS [facility] Bowel Preparation Scale) with                         scores of: Right Colon = 3, Transverse Colon = 3 and                          Left Colon = 3 (entire mucosa seen well with no                         residual staining, small fragments of stool or opaque                         liquid). The total BBPS score equals 9. Findings:      The perianal and digital rectal examinations were normal. Pertinent       negatives include normal sphincter tone and no palpable rectal lesions.      The terminal ileum appeared normal.      The entire examined colon appeared normal.      The retroflexed view of the distal rectum and anal verge was normal and       showed no anal or rectal abnormalities. Impression:            - The examined portion of the ileum was normal.                        - The entire examined colon is normal.                        - The distal rectum and anal verge are normal on                         retroflexion view.                        - No specimens collected. Recommendation:        - Discharge patient to home (with escort).                        - Resume previous diet today.                        -  Continue present medications.                        - Repeat colonoscopy in 10 years for screening                         purposes.                        - Resume Aspirin at prior dose today. Refer to                         managing physician for further adjustment of therapy. Procedure Code(s):     --- Professional ---                        H7026, Colorectal cancer screening; colonoscopy on                         individual not meeting criteria for high risk Diagnosis Code(s):     --- Professional ---                        Z12.11, Encounter for screening for malignant neoplasm                         of colon CPT copyright 2019 American Medical Association. All rights reserved. The codes documented in this report are preliminary and upon coder review may  be revised to meet current compliance requirements. Dr. Ulyess Mort Lin Landsman MD, MD 02/09/2022 9:58:56 AM This report has  been signed electronically. Number of Addenda: 0 Note Initiated On: 02/09/2022 9:34 AM Scope Withdrawal Time: 0 hours 7 minutes 44 seconds  Total Procedure Duration: 0 hours 9 minutes 29 seconds  Estimated Blood Loss:  Estimated blood loss: none.      [facility]

## 2022-02-09 NOTE — Telephone Encounter (Signed)
Called pt as a reminder for pt to get labs done. She verbalized understanding.  KP

## 2022-02-09 NOTE — H&P (Signed)
Cephas Darby, MD [address]  [place]  [place], [place] [address]  Main: [phone]  Fax: [phone] Pager: [phone]  Primary Care Physician:  Glean Hess, MD Primary Gastroenterologist:  Dr. Cephas Darby  Pre-Procedure History & Physical: HPI:  Joyce [name] is a 46 y.o. female is here for an colonoscopy.   Past Medical History:  Diagnosis Date   Acute CVA (cerebrovascular accident) ([place]) 03/04/2020   Acute left-sided weakness 03/05/2020   Anemia    Depression    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   Hypertension    Lumbar radiculitis 06/23/2021   Migraine    PTSD (post-traumatic stress disorder) 05/01/2017   Round ligament pain 11/12/2014    Past Surgical History:  Procedure Laterality Date   CHOLECYSTECTOMY      Prior to Admission medications   Medication Sig Start Date End Date Taking? Authorizing Provider  ALPRAZolam (XANAX) 0.25 MG tablet Take 0.25 mg by mouth daily as needed. 11/17/20  Yes [provider]  amLODipine (NORVASC) 5 MG tablet TAKE 1 TABLET (5 MG TOTAL) BY MOUTH DAILY. 01/07/22 01/07/23 Yes Lindell Spar, MD  ARIPiprazole (ABILIFY) 5 MG tablet Take 5 mg by mouth daily. 01/04/22  Yes [provider]  aspirin 325 MG EC tablet Take 1 tablet (325 mg total) by mouth daily. 07/20/20  Yes Meredith Staggers, MD  atorvastatin (LIPITOR) 80 MG tablet TAKE 1 TABLET (80 MG TOTAL) BY MOUTH DAILY. 01/07/22 01/07/23 Yes Lindell Spar, MD  buPROPion (WELLBUTRIN XL) 300 MG 24 hr tablet Take 300 mg by mouth every morning. 02/21/21  Yes [provider]  busPIRone (BUSPAR) 10 MG tablet Take 10 mg by mouth 2 (two) times daily. 02/11/21  Yes [provider]  cyclobenzaprine (FLEXERIL) 10 MG tablet Take 1 tablet (10 mg total) by mouth 2 (two) times daily as needed for muscle spasms. 10/04/21  Yes Marcello Fennel, PA-C  escitalopram (LEXAPRO) 20 MG tablet TAKE 1 TABLET (20 MG TOTAL) BY MOUTH DAILY. 01/07/22 01/07/23 Yes  Lindell Spar, MD  fluticasone (FLONASE) 50 MCG/ACT nasal spray Use 2 sprays in each nostril BID for a week. After 1 week, decrease to 1 spray in each nostril BID as needed for congestion/allergies. 10/06/20  Yes Noreene Larsson, NP  gabapentin (NEURONTIN) 600 MG tablet Take 1 tablet (600 mg total) by mouth 3 (three) times daily. 01/05/22  Yes Meredith Staggers, MD  ibuprofen (ADVIL) 600 MG tablet Take 1 tablet (600 mg total) by mouth every 6 (six) hours as needed. 06/07/20  Yes Idol, Almyra Free, PA-C  methocarbamol (ROBAXIN) 750 MG tablet Take 750 mg by mouth at bedtime as needed. 01/18/21  Yes [provider]  tiZANidine (ZANAFLEX) 2 MG tablet Take 1 tablet (2 mg total) by mouth 3 (three) times daily. 10/13/21  Yes Meredith Staggers, MD  topiramate (TOPAMAX) 50 MG tablet Take 1 tablet (50 mg total) by mouth 2 (two) times daily. Start 1 tablet daily x 1 week and then twice daily 01/05/22  Yes Meredith Staggers, MD  traZODone (DESYREL) 100 MG tablet Take 100 mg by mouth at bedtime. 03/31/20  Yes [provider]  trazodone (DESYREL) 300 MG tablet Take 300 mg by mouth at bedtime. 10/07/21  Yes [provider]  pantoprazole (PROTONIX) 40 MG tablet Take 1 tablet (40 mg total) by mouth daily. 12/02/20 01/11/22  Meredith Staggers, MD  PARAGARD INTRAUTERINE COPPER IU by Intrauterine route.    [provider]  predniSONE (DELTASONE) 20 MG tablet Take 1 tablet (20 mg total) by mouth daily with breakfast. Take 1 tab 3x daily for 4 days then 1 tab 2x daily for 4 days then 1 tab once daily for 4 days, then 1/2 tab daily for 4 days and off. Patient not taking: Reported on 02/09/2022 10/13/21   Meredith Staggers, MD    Allergies as of 01/12/2022   (No Known Allergies)    Family History  Problem Relation Age of Onset   Diabetes Mother    Hypertension Mother    Kidney disease Mother    Asthma Mother    Hyperlipidemia Mother    Alzheimer's disease Father    Diabetes Father    Kidney  disease Sister    Asthma Son    Diabetes Sister    Asthma Son    ADD / ADHD Son    Heart murmur Son    Asthma Son    Colon cancer Neg Hx    Colon polyps Neg Hx     Social History   Socioeconomic History   Marital status: Significant Other    Spouse name: Fransisco Beau   Number of children: 8   Years of education: 9   Highest education level: Not on file  Occupational History   Occupation: unemployed    Comment: disabled  Tobacco Use   Smoking status: Never   Smokeless tobacco: Never  Vaping Use   Vaping Use: Never used  Substance and Sexual Activity   Alcohol use: Never   Drug use: No   Sexual activity: Yes    Birth control/protection: I.U.D.    Comment: followed by GYN  Other Topics Concern   Not on file  Social History Narrative   Lives with fiance and kids   Right Handed   Drinks >12 cans of soda in caffeine   Social Determinants of Health   Financial Resource Strain: Low Risk  (10/06/2021)   Overall Financial Resource Strain (CARDIA)    Difficulty of Paying Living Expenses: Not hard at all  Food Insecurity: No Food Insecurity (10/06/2021)   Hunger Vital Sign    Worried About Running Out of Food in the Last Year: Never true    Ran Out of Food in the Last Year: Never true  Transportation Needs: No Transportation Needs (10/06/2021)   PRAPARE - Hydrologist (Medical): No    Lack of Transportation (Non-Medical): No  Physical Activity: Not on file  Stress: Not on file  Social Connections: Not on file  Intimate Partner Violence: Not At Risk (10/06/2021)   Humiliation, Afraid, Rape, and Kick questionnaire    Fear of Current or Ex-Partner: No    Emotionally Abused: No    Physically Abused: No    Sexually Abused: No    Review of Systems: See HPI, otherwise negative ROS  Physical Exam: BP (!) 168/95   Pulse 74   Temp (!) 96.7 F (35.9 C) (Temporal)   Resp 18   Ht '5\' 5"'$  (1.651 m)   Wt 96.8 kg   LMP 02/04/2022   SpO2 100%    BMI 35.52 kg/m  General:   Alert,  pleasant and cooperative in NAD Head:  Normocephalic and atraumatic. Neck:  Supple; no masses or thyromegaly. Lungs:  Clear throughout to auscultation.    Heart:  Regular rate and rhythm. Abdomen:  Soft, nontender and nondistended. Normal bowel sounds, without guarding, and without rebound.   Neurologic:  Alert and  oriented  x4;  grossly normal neurologically.  Impression/Plan: Joyce [name] is here for an colonoscopy to be performed for colon cancer screening  Risks, benefits, limitations, and alternatives regarding  colonoscopy have been reviewed with the patient.  Questions have been answered.  All parties agreeable.   Sherri Sear, MD  02/09/2022, 9:30 AM

## 2022-02-09 NOTE — Anesthesia Preprocedure Evaluation (Signed)
Anesthesia Evaluation  Patient identified by MRN, date of birth, ID band Patient awake    Reviewed: Allergy & Precautions, NPO status , Patient's Chart, lab work & pertinent test results  History of Anesthesia Complications Negative for: history of anesthetic complications  Airway Mallampati: I  TM Distance: >3 FB Neck ROM: Full    Dental no notable dental hx. (+)    Pulmonary neg pulmonary ROS,    Pulmonary exam normal breath sounds clear to auscultation       Cardiovascular hypertension, negative cardio ROS Normal cardiovascular exam Rhythm:Regular Rate:Normal     Neuro/Psych  Headaches, PSYCHIATRIC DISORDERS Anxiety Depression CVA, No Residual Symptoms    GI/Hepatic negative GI ROS, Neg liver ROS,   Endo/Other  negative endocrine ROS  Renal/GU negative Renal ROS  negative genitourinary   Musculoskeletal  (+) Arthritis , Osteoarthritis,    Abdominal   Peds negative pediatric ROS (+)  Hematology  (+) Blood dyscrasia, anemia ,   Anesthesia Other Findings   Reproductive/Obstetrics negative OB ROS                            Anesthesia Physical Anesthesia Plan  ASA: 3  Anesthesia Plan: General   Post-op Pain Management:    Induction: Intravenous  PONV Risk Score and Plan: 2 and Propofol infusion  Airway Management Planned: Natural Airway and Nasal Cannula  Additional Equipment:   Intra-op Plan:   Post-operative Plan:   Informed Consent: I have reviewed the patients History and Physical, chart, labs and discussed the procedure including the risks, benefits and alternatives for the proposed anesthesia with the patient or authorized representative who has indicated his/her understanding and acceptance.     Dental Advisory Given  Plan Discussed with: Anesthesiologist, CRNA and Surgeon  Anesthesia Plan Comments: (Patient consented for risks of anesthesia including but not  limited to:  - adverse reactions to medications - risk of airway placement if required - damage to eyes, teeth, lips or other oral mucosa - nerve damage due to positioning  - sore throat or hoarseness - Damage to heart, brain, nerves, lungs, other parts of body or loss of life  Patient voiced understanding.)        Anesthesia Quick Evaluation

## 2022-02-09 NOTE — Telephone Encounter (Signed)
Referral placed.  Pt informed and verbalized understanding.  KP

## 2022-02-10 DIAGNOSIS — D649 Anemia, unspecified: Secondary | ICD-10-CM | POA: Diagnosis not present

## 2022-02-10 DIAGNOSIS — I10 Essential (primary) hypertension: Secondary | ICD-10-CM | POA: Diagnosis not present

## 2022-02-10 DIAGNOSIS — E785 Hyperlipidemia, unspecified: Secondary | ICD-10-CM | POA: Diagnosis not present

## 2022-02-11 LAB — LIPID PANEL
Chol/HDL Ratio: 3.5 ratio (ref 0.0–4.4)
Cholesterol, Total: 143 mg/dL (ref 100–199)
HDL: 41 mg/dL (ref >39)
LDL Chol Calc (NIH): 90 mg/dL (ref 0–99)
Triglycerides: 55 mg/dL (ref 0–149)
VLDL Cholesterol Cal: 12 mg/dL (ref 5–40)

## 2022-02-11 LAB — COMPREHENSIVE METABOLIC PANEL
ALT: 14 IU/L (ref 0–32)
AST: 16 IU/L (ref 0–40)
Albumin/Globulin Ratio: 1.5 (ref 1.2–2.2)
Albumin: 4.1 g/dL (ref 3.9–4.9)
Alkaline Phosphatase: 57 IU/L (ref 44–121)
BUN/Creatinine Ratio: 12 (ref 9–23)
BUN: 9 mg/dL (ref 6–24)
Bilirubin Total: 0.2 mg/dL (ref 0.0–1.2)
CO2: 21 mmol/L (ref 20–29)
Calcium: 9 mg/dL (ref 8.7–10.2)
Chloride: 104 mmol/L (ref 96–106)
Creatinine, Ser: 0.76 mg/dL (ref 0.57–1.00)
Globulin, Total: 2.7 g/dL (ref 1.5–4.5)
Glucose: 87 mg/dL (ref 70–99)
Potassium: 4 mmol/L (ref 3.5–5.2)
Sodium: 139 mmol/L (ref 134–144)
Total Protein: 6.8 g/dL (ref 6.0–8.5)
eGFR: 98 mL/min/{1.73_m2} (ref >59)

## 2022-02-11 LAB — CBC WITH DIFFERENTIAL/PLATELET
Basophils Absolute: 0 10*3/uL (ref 0.0–0.2)
Basos: 0 %
EOS (ABSOLUTE): 0 10*3/uL (ref 0.0–0.4)
Eos: 1 %
Hematocrit: 37.6 % (ref 34.0–46.6)
Hemoglobin: 11.4 g/dL (ref 11.1–15.9)
Immature Grans (Abs): 0 10*3/uL (ref 0.0–0.1)
Immature Granulocytes: 0 %
Lymphocytes Absolute: 1.7 10*3/uL (ref 0.7–3.1)
Lymphs: 21 %
MCH: 22.4 pg — ABNORMAL LOW (ref 26.6–33.0)
MCHC: 30.3 g/dL — ABNORMAL LOW (ref 31.5–35.7)
MCV: 74 fL — ABNORMAL LOW (ref 79–97)
Monocytes Absolute: 0.4 10*3/uL (ref 0.1–0.9)
Monocytes: 4 %
Neutrophils Absolute: 6 10*3/uL (ref 1.4–7.0)
Neutrophils: 74 %
Platelets: 303 10*3/uL (ref 150–450)
RBC: 5.1 x10E6/uL (ref 3.77–5.28)
RDW: 16.6 % — ABNORMAL HIGH (ref 11.7–15.4)
WBC: 8.2 10*3/uL (ref 3.4–10.8)

## 2022-02-11 LAB — IRON,TIBC AND FERRITIN PANEL
Ferritin: 16 ng/mL (ref 15–150)
Iron Saturation: 6 % — CL (ref 15–55)
Iron: 20 ug/dL — ABNORMAL LOW (ref 27–159)
Total Iron Binding Capacity: 319 ug/dL (ref 250–450)
UIBC: 299 ug/dL (ref 131–425)

## 2022-02-11 LAB — VITAMIN B12: Vitamin B-12: 610 pg/mL (ref 232–1245)

## 2022-03-17 ENCOUNTER — Other Ambulatory Visit: Payer: Self-pay | Admitting: Internal Medicine

## 2022-03-18 MED ORDER — ATORVASTATIN CALCIUM 80 MG PO TABS: ORAL_TABLET | Freq: Every day | ORAL | 0 refills | Status: DC

## 2022-04-11 ENCOUNTER — Other Ambulatory Visit: Payer: Self-pay

## 2022-04-11 ENCOUNTER — Encounter (HOSPITAL_COMMUNITY): Payer: Self-pay

## 2022-04-11 ENCOUNTER — Emergency Department (HOSPITAL_COMMUNITY): Payer: Medicaid Other

## 2022-04-11 ENCOUNTER — Other Ambulatory Visit (HOSPITAL_COMMUNITY): Payer: Medicaid Other

## 2022-04-11 ENCOUNTER — Emergency Department (HOSPITAL_COMMUNITY)
Admission: EM | Admit: 2022-04-11 | Discharge: 2022-04-11 | Disposition: A | Discharge status: Home / Self Care | Payer: Medicaid Other | Attending: Emergency Medicine | Admitting: Emergency Medicine

## 2022-04-11 DIAGNOSIS — Z7982 Long term (current) use of aspirin: Secondary | ICD-10-CM | POA: Insufficient documentation

## 2022-04-11 DIAGNOSIS — M545 Low back pain, unspecified: Secondary | ICD-10-CM | POA: Insufficient documentation

## 2022-04-11 DIAGNOSIS — I10 Essential (primary) hypertension: Secondary | ICD-10-CM | POA: Diagnosis not present

## 2022-04-11 DIAGNOSIS — Z79899 Other long term (current) drug therapy: Secondary | ICD-10-CM | POA: Diagnosis not present

## 2022-04-11 DIAGNOSIS — M5459 Other low back pain: Secondary | ICD-10-CM | POA: Diagnosis not present

## 2022-04-11 LAB — URINALYSIS, ROUTINE W REFLEX MICROSCOPIC
Bacteria, UA: NONE SEEN
Bilirubin Urine: NEGATIVE
Glucose, UA: NEGATIVE mg/dL
Hgb urine dipstick: NEGATIVE
Ketones, ur: NEGATIVE mg/dL
Leukocytes,Ua: NEGATIVE
Nitrite: NEGATIVE
Protein, ur: 30 mg/dL — AB
Specific Gravity, Urine: 1.024 (ref 1.005–1.030)
pH: 7 (ref 5.0–8.0)

## 2022-04-11 LAB — CBC WITH DIFFERENTIAL/PLATELET
Abs Immature Granulocytes: 0.03 10*3/uL (ref 0.00–0.07)
Basophils Absolute: 0.1 10*3/uL (ref 0.0–0.1)
Basophils Relative: 1 %
Eosinophils Absolute: 0.1 10*3/uL (ref 0.0–0.5)
Eosinophils Relative: 1 %
HCT: 32.6 % — ABNORMAL LOW (ref 36.0–46.0)
Hemoglobin: 10.1 g/dL — ABNORMAL LOW (ref 12.0–15.0)
Immature Granulocytes: 0 %
Lymphocytes Relative: 32 %
Lymphs Abs: 2.7 10*3/uL (ref 0.7–4.0)
MCH: 21.9 pg — ABNORMAL LOW (ref 26.0–34.0)
MCHC: 31 g/dL (ref 30.0–36.0)
MCV: 70.6 fL — ABNORMAL LOW (ref 80.0–100.0)
Monocytes Absolute: 0.6 10*3/uL (ref 0.1–1.0)
Monocytes Relative: 7 %
Neutro Abs: 5.2 10*3/uL (ref 1.7–7.7)
Neutrophils Relative %: 59 %
Platelets: 343 10*3/uL (ref 150–400)
RBC: 4.62 MIL/uL (ref 3.87–5.11)
RDW: 17 % — ABNORMAL HIGH (ref 11.5–15.5)
WBC: 8.7 10*3/uL (ref 4.0–10.5)
nRBC: 0 % (ref 0.0–0.2)

## 2022-04-11 LAB — COMPREHENSIVE METABOLIC PANEL
ALT: 19 U/L (ref 0–44)
AST: 20 U/L (ref 15–41)
Albumin: 3.2 g/dL — ABNORMAL LOW (ref 3.5–5.0)
Alkaline Phosphatase: 45 U/L (ref 38–126)
Anion gap: 5 (ref 5–15)
BUN: 10 mg/dL (ref 6–20)
CO2: 28 mmol/L (ref 22–32)
Calcium: 8.3 mg/dL — ABNORMAL LOW (ref 8.9–10.3)
Chloride: 104 mmol/L (ref 98–111)
Creatinine, Ser: 0.66 mg/dL (ref 0.44–1.00)
GFR, Estimated: >60 mL/min (ref >60)
Glucose, Bld: 98 mg/dL (ref 70–99)
Potassium: 3.4 mmol/L — ABNORMAL LOW (ref 3.5–5.1)
Sodium: 137 mmol/L (ref 135–145)
Total Bilirubin: 0.2 mg/dL — ABNORMAL LOW (ref 0.3–1.2)
Total Protein: 6.6 g/dL (ref 6.5–8.1)

## 2022-04-11 LAB — POC URINE PREG, ED: Preg Test, Ur: NEGATIVE

## 2022-04-11 MED ORDER — METHOCARBAMOL 500 MG PO TABS: 500.0000 mg | ORAL_TABLET | Freq: Once | ORAL | Status: DC

## 2022-04-11 MED ORDER — METHOCARBAMOL 500 MG PO TABS: 500.0000 mg | ORAL_TABLET | Freq: Two times a day (BID) | ORAL | 0 refills | Status: DC

## 2022-04-11 MED ORDER — HYDROCODONE-ACETAMINOPHEN 5-325 MG PO TABS
1.0000 | ORAL_TABLET | Freq: Once | ORAL | Status: AC
  Administered 2022-04-11: 1 via ORAL
  Filled 2022-04-11: qty 1

## 2022-04-11 MED ORDER — HYDROCODONE-ACETAMINOPHEN 5-325 MG PO TABS: 1.0000 | ORAL_TABLET | Freq: Four times a day (QID) | ORAL | 0 refills | Status: DC | PRN

## 2022-04-11 NOTE — ED Triage Notes (Signed)
Pt presents to ED with complaints of lower back pain since Saturday, denies urinary symptoms

## 2022-04-11 NOTE — Discharge Instructions (Signed)
Take the medications prescribed for your back pain.  You may also benefit from a heating pad applied to your lower back for 20 minutes several times daily.  Plan follow-up with your primary doctor if your symptoms are not improving over the next week.  You may take the hydrocodone prescribed for pain relief.  This will make you drowsy - do not drive within 4 hours of taking this medication.

## 2022-04-11 NOTE — ED Provider Notes (Signed)
[facility] EMERGENCY DEPARTMENT Provider Note   CSN: [id number] Arrival date & time: 04/11/22  1651     History  Chief Complaint  Patient presents with   Back Pain    Joyce [name] is a 46 y.o. female with a history including migraine headache, hypertension, anemia, history of CVA with left-sided weakness, chronic low back pain with diagnosis of arthritis in her lower back presenting with a 2-day history of bilateral low back pain which radiates into her lower abdomen.  She denies weakness or numbness in her extremities, also denies urinary incontinence or retention, no fevers or chills, nausea or vomiting.  She states she has had similar symptoms frequently in the past and is prescribed Neurontin by her PCP for this condition.  She does describe a muscle spasm quality to her symptoms.  She has been on muscle relaxers in the past as well.  Her pain is worsened with positional changes.      The history is provided by the patient.       Home Medications Prior to Admission medications   Medication Sig Start Date End Date Taking? Authorizing Provider  HYDROcodone-acetaminophen (NORCO/VICODIN) 5-325 MG tablet Take 1 tablet by mouth every 6 (six) hours as needed for severe pain. 04/11/22  Yes Athalee Esterline, Almyra Free, PA-C  methocarbamol (ROBAXIN) 500 MG tablet Take 1 tablet (500 mg total) by mouth 2 (two) times daily. 04/11/22  Yes Clifford Coudriet, Almyra Free, PA-C  ALPRAZolam Duanne Moron) 0.25 MG tablet Take 0.25 mg by mouth daily as needed. 11/17/20   [provider]  amLODipine (NORVASC) 5 MG tablet TAKE 1 TABLET (5 MG TOTAL) BY MOUTH DAILY. 01/07/22 01/07/23  Lindell Spar, MD  ARIPiprazole (ABILIFY) 5 MG tablet Take 5 mg by mouth daily. 01/04/22   [provider]  aspirin 325 MG EC tablet Take 1 tablet (325 mg total) by mouth daily. 07/20/20   Meredith Staggers, MD  atorvastatin (LIPITOR) 80 MG tablet TAKE 1 TABLET (80 MG TOTAL) BY MOUTH DAILY. 03/18/22 03/18/23  Lindell Spar, MD  buPROPion  (WELLBUTRIN XL) 300 MG 24 hr tablet Take 300 mg by mouth every morning. 02/21/21   [provider]  busPIRone (BUSPAR) 10 MG tablet Take 10 mg by mouth 2 (two) times daily. 02/11/21   [provider]  cyclobenzaprine (FLEXERIL) 10 MG tablet Take 1 tablet (10 mg total) by mouth 2 (two) times daily as needed for muscle spasms. 10/04/21   Marcello Fennel, PA-C  escitalopram (LEXAPRO) 20 MG tablet TAKE 1 TABLET (20 MG TOTAL) BY MOUTH DAILY. 01/07/22 01/07/23  Lindell Spar, MD  fluticasone (FLONASE) 50 MCG/ACT nasal spray Use 2 sprays in each nostril BID for a week. After 1 week, decrease to 1 spray in each nostril BID as needed for congestion/allergies. 10/06/20   Noreene Larsson, NP  gabapentin (NEURONTIN) 600 MG tablet Take 1 tablet (600 mg total) by mouth 3 (three) times daily. 01/05/22   Meredith Staggers, MD  ibuprofen (ADVIL) 600 MG tablet Take 1 tablet (600 mg total) by mouth every 6 (six) hours as needed. 06/07/20   Evalee Jefferson, PA-C  pantoprazole (PROTONIX) 40 MG tablet Take 1 tablet (40 mg total) by mouth daily. 12/02/20 01/11/22  Meredith Staggers, MD  PARAGARD INTRAUTERINE COPPER IU by Intrauterine route.    [provider]  topiramate (TOPAMAX) 50 MG tablet Take 1 tablet (50 mg total) by mouth 2 (two) times daily. Start 1 tablet daily x 1 week and then twice  daily 01/05/22   Meredith Staggers, MD  traZODone (DESYREL) 100 MG tablet Take 100 mg by mouth at bedtime. 03/31/20   [provider]  trazodone (DESYREL) 300 MG tablet Take 300 mg by mouth at bedtime. 10/07/21   [provider]      Allergies    Patient has no known allergies.    Review of Systems   Review of Systems  Constitutional:  Negative for chills and fever.  Respiratory:  Negative for shortness of breath.   Cardiovascular:  Negative for chest pain and leg swelling.  Gastrointestinal:  Negative for abdominal distention, abdominal pain, constipation, nausea and vomiting.  Genitourinary:   Negative for difficulty urinating, dysuria, flank pain, frequency and urgency.  Musculoskeletal:  Positive for back pain. Negative for gait problem and joint swelling.  Skin:  Negative for rash.  Neurological:  Negative for weakness and numbness.    Physical Exam Updated Vital Signs BP (!) 154/88 (BP Location: Left Arm)   Pulse 78   Temp 98.4 F (36.9 C) (Oral)   Resp 16   Ht _0  (1.651 m)   Wt 104.3 kg   SpO2 99%   BMI 38.27 kg/m  Physical Exam Vitals and nursing note reviewed.  Constitutional:      Appearance: She is well-developed.  HENT:     Head: Normocephalic.  Eyes:     Conjunctiva/sclera: Conjunctivae normal.  Cardiovascular:     Rate and Rhythm: Normal rate.     Pulses: Normal pulses.     Comments: Pedal pulses normal. Pulmonary:     Effort: Pulmonary effort is normal.  Abdominal:     General: Bowel sounds are normal. There is no distension.     Palpations: Abdomen is soft. There is no mass.     Tenderness: There is no guarding or rebound.  Musculoskeletal:        General: Normal range of motion.     Cervical back: Normal range of motion and neck supple.     Lumbar back: Tenderness present. No swelling, edema or spasms.  Skin:    General: Skin is warm and dry.  Neurological:     Mental Status: She is alert.     Sensory: No sensory deficit.     Motor: No tremor or atrophy.     Gait: Gait normal.     Deep Tendon Reflexes:     Reflex Scores:      Patellar reflexes are 2+ on the right side and 2+ on the left side.      Achilles reflexes are 2+ on the right side and 2+ on the left side.    Comments: No strength deficit noted in hip and knee flexor and extensor muscle groups.  Ankle flexion and extension intact.     ED Results / Procedures / Treatments   Labs (all labs ordered are listed, but only abnormal results are displayed) Labs Reviewed  CBC WITH DIFFERENTIAL/PLATELET - Abnormal; Notable for the following components:      Result Value    Hemoglobin 10.1 (*)    HCT 32.6 (*)    MCV 70.6 (*)    MCH 21.9 (*)    RDW 17.0 (*)    All other components within normal limits  COMPREHENSIVE METABOLIC PANEL - Abnormal; Notable for the following components:   Potassium 3.4 (*)    Calcium 8.3 (*)    Albumin 3.2 (*)    Total Bilirubin 0.2 (*)    All other components within  normal limits  URINALYSIS, ROUTINE W REFLEX MICROSCOPIC - Abnormal; Notable for the following components:   APPearance HAZY (*)    Protein, ur 30 (*)    All other components within normal limits  POC URINE PREG, ED    EKG None  Radiology DG Lumbar Spine Complete  Result Date: 04/11/2022 CLINICAL DATA:  Pain EXAM: LUMBAR SPINE - COMPLETE 4+ VIEW COMPARISON:  MRI lumbar spine dated 07/04/2021 FINDINGS: Five lumbar-type vertebral bodies. Normal lumbar lordosis. No evidence of fracture or dislocation. Vertebral body heights and intervertebral disc spaces are maintained. Visualized bony pelvis appears intact. IUD overlying the pelvis. IMPRESSION: Negative. Electronically Signed   By: Julian Hy M.D.   On: 04/11/2022 19:53    Procedures Procedures    Medications Ordered in ED Medications  methocarbamol (ROBAXIN) tablet 500 mg (has no administration in time range)  HYDROcodone-acetaminophen (NORCO/VICODIN) 5-325 MG per tablet 1 tablet (1 tablet Oral Given 04/11/22 1955)    ED Course/ Medical Decision Making/ A&P                           Medical Decision Making Patient with acute on chronic low back pain, however symptoms radiate into her bilateral lower abdomen/pelvis region, denies dysuria, history of kidney stones, bowel or bladder changes.  Her pain is reproducible with movement and palpation.  She has nonacute abdominal exam.  Labs have been obtained given the radiating character of her symptoms.  Amount and/or Complexity of Data Reviewed Labs: ordered.    Details: Significant labs including hemoglobin of 10.1, c-Met and urinalysis  unremarkable. Radiology: ordered.    Details: Lumbar spine negative for acute injury or obvious source of symptoms.  Risk Prescription drug management.           Final Clinical Impression(s) / ED Diagnoses Final diagnoses:  Acute bilateral low back pain without sciatica    Rx / DC Orders ED Discharge Orders          Ordered    methocarbamol (ROBAXIN) 500 MG tablet  2 times daily        04/11/22 2118    HYDROcodone-acetaminophen (NORCO/VICODIN) 5-325 MG tablet  Every 6 hours PRN        04/11/22 2118              Evalee Jefferson, Hershal Coria 04/11/22 2324    Fredia Sorrow, MD 04/13/22 [phone]

## 2022-04-11 NOTE — ED Notes (Addendum)
Patient transported to X-ray 

## 2022-04-13 ENCOUNTER — Encounter: Payer: Self-pay | Admitting: Internal Medicine

## 2022-04-13 ENCOUNTER — Other Ambulatory Visit: Payer: Medicaid Other

## 2022-04-13 DIAGNOSIS — D649 Anemia, unspecified: Secondary | ICD-10-CM | POA: Diagnosis not present

## 2022-04-14 ENCOUNTER — Ambulatory Visit: Payer: Self-pay | Admitting: *Deleted

## 2022-04-14 ENCOUNTER — Encounter: Payer: Self-pay | Admitting: Internal Medicine

## 2022-04-14 LAB — IRON,TIBC AND FERRITIN PANEL
Ferritin: 10 ng/mL — ABNORMAL LOW (ref 15–150)
Iron Saturation: 9 % — CL (ref 15–55)
Iron: 29 ug/dL (ref 27–159)
Total Iron Binding Capacity: 318 ug/dL (ref 250–450)
UIBC: 289 ug/dL (ref 131–425)

## 2022-04-14 NOTE — Telephone Encounter (Signed)
Pt given lab results per notes of Dr. Army Melia on 04/14/22. Pt verbalized understanding iron levels are unchanged and is agreeable to hematology consultation.

## 2022-05-04 ENCOUNTER — Encounter: Payer: Medicaid Other | Admitting: Physical Medicine & Rehabilitation

## 2022-05-11 ENCOUNTER — Ambulatory Visit: Payer: Medicaid Other | Admitting: Internal Medicine

## 2022-05-11 ENCOUNTER — Encounter: Payer: Self-pay | Admitting: Internal Medicine

## 2022-05-11 VITALS — BP 144/86 | HR 71 | Ht 65.0 in | Wt 241.0 lb

## 2022-05-11 DIAGNOSIS — Z23 Encounter for immunization: Secondary | ICD-10-CM | POA: Diagnosis not present

## 2022-05-11 DIAGNOSIS — M545 Low back pain, unspecified: Secondary | ICD-10-CM

## 2022-05-11 DIAGNOSIS — I10 Essential (primary) hypertension: Secondary | ICD-10-CM | POA: Diagnosis not present

## 2022-05-11 DIAGNOSIS — G8929 Other chronic pain: Secondary | ICD-10-CM

## 2022-05-11 MED ORDER — PREDNISONE 10 MG PO TABS: ORAL_TABLET | ORAL | 0 refills | Status: AC

## 2022-05-11 MED ORDER — AMLODIPINE BESYLATE 5 MG PO TABS: ORAL_TABLET | Freq: Every day | ORAL | 0 refills | Status: DC

## 2022-05-11 NOTE — Progress Notes (Signed)
Date:  05/11/2022   Name:  Joyce [name]   DOB:  [birth year]   MRN:  [id number]   Chief Complaint: Back Pain  Back Pain This is a recurrent problem. The problem occurs constantly. The problem has been gradually worsening since onset. The quality of the pain is described as shooting. The pain radiates to the left knee, left thigh and left foot. The pain is at a severity of 10/10. The pain is severe. The pain is The same all the time. The symptoms are aggravated by bending, lying down, sitting, twisting, standing and coughing. Associated symptoms include weakness (left sided from CVA). Pertinent negatives include no abdominal pain, chest pain, fever or pelvic pain. She has tried muscle relaxant, ice, heat, home exercises and NSAIDs for the symptoms. The treatment provided mild relief.    Lab Results  Component Value Date   NA 137 04/11/2022   K 3.4 (L) 04/11/2022   CO2 28 04/11/2022   GLUCOSE 98 04/11/2022   BUN 10 04/11/2022   CREATININE 0.66 04/11/2022   CALCIUM 8.3 (L) 04/11/2022   EGFR 98 02/10/2022   GFRNONAA >60 04/11/2022   Lab Results  Component Value Date   CHOL 143 02/10/2022   HDL 41 02/10/2022   LDLCALC 90 02/10/2022   TRIG 55 02/10/2022   CHOLHDL 3.5 02/10/2022   Lab Results  Component Value Date   TSH 0.523 03/06/2020   Lab Results  Component Value Date   HGBA1C 5.6 03/05/2020   Lab Results  Component Value Date   WBC 8.7 04/11/2022   HGB 10.1 (L) 04/11/2022   HCT 32.6 (L) 04/11/2022   MCV 70.6 (L) 04/11/2022   PLT 343 04/11/2022   Lab Results  Component Value Date   ALT 19 04/11/2022   AST 20 04/11/2022   ALKPHOS 45 04/11/2022   BILITOT 0.2 (L) 04/11/2022   Lab Results  Component Value Date   VD25OH 33 02/15/2017     Review of Systems  Constitutional:  Negative for chills, fatigue and fever.  Respiratory:  Negative for cough, chest tightness and shortness of breath.   Cardiovascular:  Negative for chest pain and leg swelling.   Gastrointestinal:  Negative for abdominal pain, constipation and diarrhea.  Genitourinary:  Negative for difficulty urinating, frequency, pelvic pain and urgency.  Musculoskeletal:  Positive for back pain and myalgias.  Neurological:  Positive for weakness (left sided from CVA).  Psychiatric/Behavioral:  Positive for dysphoric mood. Negative for sleep disturbance. The patient is nervous/anxious.     Patient Active Problem List   Diagnosis Date Noted   Encounter for screening colonoscopy    Iron deficiency anemia 01/11/2022   Hamstring tendinitis at origin 10/13/2021   Lumbar radiculitis 06/23/2021   Left lateral epicondylitis 06/23/2021   Chronic left-sided low back pain without sciatica 07/01/2020   Neuropathic pain 05/06/2020   Anxiety state    Dyslipidemia    Migraine without status migrainosus, not intractable    History of stroke 03/10/2020   Left hemiparesis ([place]) 03/10/2020   Recurrent falls 03/05/2020   MDD (major depressive disorder), recurrent episode, moderate ([place]) 05/01/2017   PTSD (post-traumatic stress disorder) 05/01/2017   Essential hypertension 07/20/2016   Uterine fibroid 01/27/2016   Cervical high risk HPV (human papillomavirus) test positive 12/08/2014    No Known Allergies  Past Surgical History:  Procedure Laterality Date   CHOLECYSTECTOMY     COLONOSCOPY WITH PROPOFOL N/A 02/09/2022   Procedure: COLONOSCOPY WITH PROPOFOL;  Surgeon: Lin Landsman,  MD;  Location: [facility] ENDOSCOPY;  Service: Gastroenterology;  Laterality: N/A;    Social History   Tobacco Use   Smoking status: Never   Smokeless tobacco: Never  Vaping Use   Vaping Use: Never used  Substance Use Topics   Alcohol use: Never   Drug use: No     Medication list has been reviewed and updated.  Current Meds  Medication Sig   ALPRAZolam (XANAX) 0.25 MG tablet Take 0.25 mg by mouth daily as needed.   amLODipine (NORVASC) 5 MG tablet TAKE 1 TABLET (5 MG TOTAL) BY MOUTH DAILY.    ARIPiprazole (ABILIFY) 5 MG tablet Take 5 mg by mouth daily.   aspirin 325 MG EC tablet Take 1 tablet (325 mg total) by mouth daily.   atorvastatin (LIPITOR) 80 MG tablet TAKE 1 TABLET (80 MG TOTAL) BY MOUTH DAILY.   buPROPion (WELLBUTRIN XL) 300 MG 24 hr tablet Take 300 mg by mouth every morning.   busPIRone (BUSPAR) 10 MG tablet Take 10 mg by mouth 2 (two) times daily.   cyclobenzaprine (FLEXERIL) 10 MG tablet Take 1 tablet (10 mg total) by mouth 2 (two) times daily as needed for muscle spasms.   escitalopram (LEXAPRO) 20 MG tablet TAKE 1 TABLET (20 MG TOTAL) BY MOUTH DAILY.   fluticasone (FLONASE) 50 MCG/ACT nasal spray Use 2 sprays in each nostril BID for a week. After 1 week, decrease to 1 spray in each nostril BID as needed for congestion/allergies.   gabapentin (NEURONTIN) 600 MG tablet Take 1 tablet (600 mg total) by mouth 3 (three) times daily.   HYDROcodone-acetaminophen (NORCO/VICODIN) 5-325 MG tablet Take 1 tablet by mouth every 6 (six) hours as needed for severe pain.   ibuprofen (ADVIL) 600 MG tablet Take 1 tablet (600 mg total) by mouth every 6 (six) hours as needed.   methocarbamol (ROBAXIN) 500 MG tablet Take 1 tablet (500 mg total) by mouth 2 (two) times daily.   pantoprazole (PROTONIX) 40 MG tablet Take 1 tablet (40 mg total) by mouth daily.   PARAGARD INTRAUTERINE COPPER IU by Intrauterine route.   topiramate (TOPAMAX) 50 MG tablet Take 1 tablet (50 mg total) by mouth 2 (two) times daily. Start 1 tablet daily x 1 week and then twice daily   traZODone (DESYREL) 100 MG tablet Take 100 mg by mouth at bedtime.   trazodone (DESYREL) 300 MG tablet Take 300 mg by mouth at bedtime.   VRAYLAR 1.5 MG capsule Take 1.5 mg by mouth daily.       05/11/2022    3:25 PM 01/11/2022    3:06 PM 10/06/2021    2:39 PM  GAD 7 : Generalized Anxiety Score  Nervous, Anxious, on Edge _0 Control/stop worrying _1 Worry too much - different things _2 Trouble relaxing _3 Restless _4 Easily annoyed or irritable _5 Afraid - awful might happen _6 Total GAD 7 Score _7 Anxiety Difficulty Somewhat difficult Extremely difficult Extremely difficult       05/11/2022    3:25 PM 01/11/2022    3:05 PM 01/05/2022    3:06 PM  Depression screen PHQ 2/9  Decreased Interest 2 3 0  Down, Depressed, Hopeless 3 3 0  PHQ - 2 Score 5 6 0  Altered sleeping 3 3   Tired, decreased energy 3 3   Change in appetite 0 0  Feeling bad or failure about yourself  2 1   Trouble concentrating 3 3   Moving slowly or fidgety/restless 1 0   Suicidal thoughts 0 0   PHQ-9 Score 17 16   Difficult doing work/chores Somewhat difficult Very difficult     BP Readings from Last 3 Encounters:  05/11/22 (!) 144/86  04/11/22 (!) 154/88  02/09/22 (!) 131/96    Physical Exam Constitutional:      General: She is in acute distress.  Cardiovascular:     Rate and Rhythm: Normal rate and regular rhythm.     Pulses: Normal pulses.  Pulmonary:     Effort: Pulmonary effort is normal.     Breath sounds: Normal breath sounds. No wheezing.  Musculoskeletal:     Cervical back: Normal range of motion.     Lumbar back: Tenderness present. No deformity. Positive right straight leg raise test and positive left straight leg raise test.  Lymphadenopathy:     Cervical: No cervical adenopathy.  Neurological:     Mental Status: She is alert.     Gait: Gait abnormal.     Deep Tendon Reflexes:     Reflex Scores:      Patellar reflexes are 1+ on the right side and 1+ on the left side.      Achilles reflexes are 1+ on the right side.  MRI lumbar  06/2021: IMPRESSION: 1. Isolated lumbar disc degeneration at L2-L3 with a small to moderate left paracentral disc extrusion most affecting the left lateral recess. Query left L3 radiculitis.   2. Otherwise moderate lower lumbar facet arthropathy. No other lumbar spinal stenosis or convincing neural impingement.  Wt Readings from Last 3 Encounters:   05/11/22 241 lb (109.3 kg)  04/11/22 230 lb (104.3 kg)  02/09/22 213 lb 6.8 oz (96.8 kg)    BP (!) 144/86 (BP Location: Right Arm)   Pulse 71   Ht 5' 5" (1.651 m)   Wt 241 lb (109.3 kg)   SpO2 98%   BMI 40.10 kg/m   Assessment and Plan: 1. Chronic left-sided low back pain without sciatica Will try steroid taper and refer to pain clinic as she has failed conservative treatment from rehab MD after stroke - Ambulatory referral to Pain Clinic - predniSONE (DELTASONE) 10 MG tablet; Take 6 tablets (60 mg total) by mouth daily with breakfast for 1 day, THEN 5 tablets (50 mg total) daily with breakfast for 1 day, THEN 4 tablets (40 mg total) daily with breakfast for 1 day, THEN 3 tablets (30 mg total) daily with breakfast for 1 day, THEN 2 tablets (20 mg total) daily with breakfast for 1 day, THEN 1 tablet (10 mg total) daily with breakfast for 1 day.  Dispense: 21 tablet; Refill: 0  2. Essential hypertension BP elevated today since being out of medications  Resume amlodipine daily Follow up in 3 months. - amLODipine (NORVASC) 5 MG tablet; TAKE 1 TABLET (5 MG TOTAL) BY MOUTH DAILY.  Dispense: 90 tablet; Refill: 0  3. Need for immunization against influenza - Flu Vaccine QUAD 42moIM (Fluarix, Fluzone & Alfiuria Quad PF)   Partially dictated using DEditor, commissioning Any errors are unintentional.  LHalina Maidens MD [facility]  05/11/2022

## 2022-06-28 DIAGNOSIS — F319 Bipolar disorder, unspecified: Secondary | ICD-10-CM | POA: Diagnosis not present

## 2022-06-28 DIAGNOSIS — F410 Panic disorder [episodic paroxysmal anxiety] without agoraphobia: Secondary | ICD-10-CM | POA: Diagnosis not present

## 2022-06-28 DIAGNOSIS — F411 Generalized anxiety disorder: Secondary | ICD-10-CM | POA: Diagnosis not present

## 2022-06-29 ENCOUNTER — Encounter: Payer: Medicaid Other | Admitting: Physical Medicine & Rehabilitation

## 2022-08-10 ENCOUNTER — Ambulatory Visit: Payer: Medicaid Other | Admitting: Internal Medicine

## 2022-08-15 ENCOUNTER — Encounter: Payer: Self-pay | Admitting: Internal Medicine

## 2022-08-15 ENCOUNTER — Ambulatory Visit: Payer: Medicaid Other | Admitting: Internal Medicine

## 2022-08-15 VITALS — BP 132/82 | HR 86 | Ht 65.0 in | Wt 236.4 lb

## 2022-08-15 DIAGNOSIS — I10 Essential (primary) hypertension: Secondary | ICD-10-CM

## 2022-08-15 DIAGNOSIS — E785 Hyperlipidemia, unspecified: Secondary | ICD-10-CM | POA: Diagnosis not present

## 2022-08-15 DIAGNOSIS — G8194 Hemiplegia, unspecified affecting left nondominant side: Secondary | ICD-10-CM | POA: Diagnosis not present

## 2022-08-15 DIAGNOSIS — F331 Major depressive disorder, recurrent, moderate: Secondary | ICD-10-CM | POA: Diagnosis not present

## 2022-08-15 MED ORDER — ROSUVASTATIN CALCIUM 10 MG PO TABS: 10.0000 mg | ORAL_TABLET | Freq: Every day | ORAL | 3 refills | Status: DC

## 2022-08-15 MED ORDER — AMLODIPINE BESYLATE 5 MG PO TABS: ORAL_TABLET | Freq: Every day | ORAL | 3 refills | Status: DC

## 2022-08-15 NOTE — Progress Notes (Signed)
Date:  08/15/2022   Name:  Joyce [name]   DOB:  1975-06-24   MRN:  [id number]   Chief Complaint: Hypertension and Back Pain  Hypertension This is a chronic problem. The problem is controlled. Pertinent negatives include no chest pain or shortness of breath. Past treatments include calcium channel blockers. The current treatment provides significant improvement. Hypertensive end-organ damage includes CVA. There is no history of kidney disease or CAD/MI.  Back Pain This is a chronic problem. The problem occurs daily. The problem has been waxing and waning since onset. The pain is present in the lumbar spine. The quality of the pain is described as burning. The pain radiates to the right knee and right thigh. The pain is moderate. Associated symptoms include weakness (very mild left sided). Pertinent negatives include no bladder incontinence, bowel incontinence, chest pain or fever. Treatments tried: recently seen by Pain management - starting Lyrica today.  CVA - in 2021 with mild left sided residual.  She was previously on Lipitor 80 mg but stopped it a while ago due to taking too many pills.  Lab Results  Component Value Date   NA 137 04/11/2022   K 3.4 (L) 04/11/2022   CO2 28 04/11/2022   GLUCOSE 98 04/11/2022   BUN 10 04/11/2022   CREATININE 0.66 04/11/2022   CALCIUM 8.3 (L) 04/11/2022   EGFR 98 02/10/2022   GFRNONAA >60 04/11/2022   Lab Results  Component Value Date   CHOL 143 02/10/2022   HDL 41 02/10/2022   LDLCALC 90 02/10/2022   TRIG 55 02/10/2022   CHOLHDL 3.5 02/10/2022   Lab Results  Component Value Date   TSH 0.523 03/06/2020   Lab Results  Component Value Date   HGBA1C 5.6 03/05/2020   Lab Results  Component Value Date   WBC 8.7 04/11/2022   HGB 10.1 (L) 04/11/2022   HCT 32.6 (L) 04/11/2022   MCV 70.6 (L) 04/11/2022   PLT 343 04/11/2022   Lab Results  Component Value Date   ALT 19 04/11/2022   AST 20 04/11/2022   ALKPHOS 45 04/11/2022    BILITOT 0.2 (L) 04/11/2022   Lab Results  Component Value Date   VD25OH 33 02/15/2017     Review of Systems  Constitutional:  Negative for chills, fatigue and fever.  HENT:  Negative for trouble swallowing.   Respiratory:  Negative for chest tightness and shortness of breath.   Cardiovascular:  Negative for chest pain.  Gastrointestinal:  Negative for bowel incontinence.  Genitourinary:  Negative for bladder incontinence and hematuria.  Musculoskeletal:  Positive for back pain and gait problem.  Neurological:  Positive for weakness (very mild left sided).  Psychiatric/Behavioral:  Negative for dysphoric mood and sleep disturbance. The patient is not nervous/anxious.     Patient Active Problem List   Diagnosis Date Noted   Encounter for screening colonoscopy    Iron deficiency anemia 01/11/2022   Hamstring tendinitis at origin 10/13/2021   Lumbar radiculitis 06/23/2021   Left lateral epicondylitis 06/23/2021   Chronic left-sided low back pain without sciatica 07/01/2020   Neuropathic pain 05/06/2020   Anxiety state    Dyslipidemia    Migraine without status migrainosus, not intractable    History of stroke 03/10/2020   Left hemiparesis ([place]) 03/10/2020   Recurrent falls 03/05/2020   MDD (major depressive disorder), recurrent episode, moderate ([place]) 05/01/2017   PTSD (post-traumatic stress disorder) 05/01/2017   Essential hypertension 07/20/2016   Uterine fibroid 01/27/2016  Cervical high risk HPV (human papillomavirus) test positive 12/08/2014    No Known Allergies  Past Surgical History:  Procedure Laterality Date   CHOLECYSTECTOMY     COLONOSCOPY WITH PROPOFOL N/A 02/09/2022   Procedure: COLONOSCOPY WITH PROPOFOL;  Surgeon: Lin Landsman, MD;  Location: [facility] ENDOSCOPY;  Service: Gastroenterology;  Laterality: N/A;    Social History   Tobacco Use   Smoking status: Never   Smokeless tobacco: Never  Vaping Use   Vaping Use: Never used  Substance Use Topics    Alcohol use: Never   Drug use: No     Medication list has been reviewed and updated.  Current Meds  Medication Sig   fluticasone (FLONASE) 50 MCG/ACT nasal spray Use 2 sprays in each nostril BID for a week. After 1 week, decrease to 1 spray in each nostril BID as needed for congestion/allergies.   ibuprofen (ADVIL) 600 MG tablet Take 1 tablet (600 mg total) by mouth every 6 (six) hours as needed.   PARAGARD INTRAUTERINE COPPER IU by Intrauterine route.   pregabalin (LYRICA) 25 MG capsule Take 25 mg by mouth 3 (three) times daily.   rosuvastatin (CRESTOR) 10 MG tablet Take 1 tablet (10 mg total) by mouth daily.   traZODone (DESYREL) 100 MG tablet Take 100 mg by mouth at bedtime.   [DISCONTINUED] amLODipine (NORVASC) 5 MG tablet TAKE 1 TABLET (5 MG TOTAL) BY MOUTH DAILY.   [DISCONTINUED] ARIPiprazole (ABILIFY) 5 MG tablet Take 5 mg by mouth daily.   [DISCONTINUED] aspirin 325 MG EC tablet Take 1 tablet (325 mg total) by mouth daily.   [DISCONTINUED] tiZANidine (ZANAFLEX) 4 MG tablet Take 4 mg by mouth 2 (two) times daily.       08/15/2022    4:17 PM 05/11/2022    3:25 PM 01/11/2022    3:06 PM 10/06/2021    2:39 PM  GAD 7 : Generalized Anxiety Score  Nervous, Anxious, on Edge 1 3 3 3   Control/stop worrying 3 3 3 3   Worry too much - different things 3 3 3 3   Trouble relaxing 2 3 3 3   Restless 2 2 3 2   Easily annoyed or irritable 2 2 3 3   Afraid - awful might happen 0 1 3 1   Total GAD 7 Score 13 17 21 18   Anxiety Difficulty Somewhat difficult Somewhat difficult Extremely difficult Extremely difficult       08/15/2022    4:17 PM 05/11/2022    3:25 PM 01/11/2022    3:05 PM  Depression screen PHQ 2/9  Decreased Interest 1 2 3   Down, Depressed, Hopeless 2 3 3   PHQ - 2 Score 3 5 6   Altered sleeping 3 3 3   Tired, decreased energy 2 3 3   Change in appetite 0 0 0  Feeling bad or failure about yourself  0 2 1  Trouble concentrating 3 3 3   Moving slowly or fidgety/restless 0 1 0   Suicidal thoughts 0 0 0  PHQ-9 Score 11 17 16   Difficult doing work/chores Very difficult Somewhat difficult Very difficult    BP Readings from Last 3 Encounters:  08/15/22 132/82  05/11/22 (!) 144/86  04/11/22 (!) 154/88    Physical Exam Constitutional:      Appearance: Normal appearance.  Cardiovascular:     Rate and Rhythm: Normal rate and regular rhythm.     Heart sounds: No murmur heard. Pulmonary:     Effort: Pulmonary effort is normal.     Breath sounds: No wheezing  or rhonchi.  Musculoskeletal:     Cervical back: Normal range of motion.  Skin:    General: Skin is warm and dry.  Neurological:     Mental Status: She is alert. Mental status is at baseline.  Psychiatric:        Mood and Affect: Mood normal.     Wt Readings from Last 3 Encounters:  08/15/22 236 lb 6.4 oz (107.2 kg)  05/11/22 241 lb (109.3 kg)  04/11/22 230 lb (104.3 kg)    BP 132/82   Pulse 86   Ht 5\' 5"  (1.651 m)   Wt 236 lb 6.4 oz (107.2 kg)   SpO2 99%   BMI 39.34 kg/m   Assessment and Plan:  Problem List Items Addressed This Visit       Cardiovascular and Mediastinum   Essential hypertension (Chronic)    Clinically stable exam with well controlled BP on amlodipine . Tolerating medications without side effects. Pt to continue current regimen and low sodium diet.       Relevant Medications   amLODipine (NORVASC) 5 MG tablet   rosuvastatin (CRESTOR) 10 MG tablet   Other Relevant Orders   CBC with Differential/Platelet   Comprehensive metabolic panel   TSH     Nervous and Auditory   Left hemiparesis (HCC) (Chronic)    Continues to slowly improve. Now walking independently Recommend resuming statin therapy        Other   Dyslipidemia - Primary (Chronic)   Relevant Medications   rosuvastatin (CRESTOR) 10 MG tablet   Other Relevant Orders   Lipid panel   MDD (major depressive disorder), recurrent episode, moderate (HCC) (Chronic)    On Trazodone for sleep. Recently  weaned off [place].       Return for HTN, cholesterol.   Partially dictated using Ages, any errors are not intentional.  Glean Hess, MD [place], [place]

## 2022-08-15 NOTE — Assessment & Plan Note (Addendum)
On Trazodone for sleep. Recently weaned off [place].

## 2022-08-15 NOTE — Assessment & Plan Note (Signed)
Continues to slowly improve. Now walking independently Recommend resuming statin therapy

## 2022-08-15 NOTE — Assessment & Plan Note (Signed)
Clinically stable exam with well controlled BP on amlodipine. Tolerating medications without side effects. Pt to continue current regimen and low sodium diet.  

## 2022-08-16 DIAGNOSIS — F319 Bipolar disorder, unspecified: Secondary | ICD-10-CM | POA: Diagnosis not present

## 2022-08-16 DIAGNOSIS — F411 Generalized anxiety disorder: Secondary | ICD-10-CM | POA: Diagnosis not present

## 2022-08-16 DIAGNOSIS — F410 Panic disorder [episodic paroxysmal anxiety] without agoraphobia: Secondary | ICD-10-CM | POA: Diagnosis not present

## 2022-08-16 LAB — COMPREHENSIVE METABOLIC PANEL
ALT: 16 IU/L (ref 0–32)
AST: 16 IU/L (ref 0–40)
Albumin/Globulin Ratio: 1.6 (ref 1.2–2.2)
Albumin: 4.2 g/dL (ref 3.9–4.9)
Alkaline Phosphatase: 53 IU/L (ref 44–121)
BUN/Creatinine Ratio: 10 (ref 9–23)
BUN: 7 mg/dL (ref 6–24)
Bilirubin Total: 0.2 mg/dL (ref 0.0–1.2)
CO2: 23 mmol/L (ref 20–29)
Calcium: 9.2 mg/dL (ref 8.7–10.2)
Chloride: 103 mmol/L (ref 96–106)
Creatinine, Ser: 0.7 mg/dL (ref 0.57–1.00)
Globulin, Total: 2.7 g/dL (ref 1.5–4.5)
Glucose: 91 mg/dL (ref 70–99)
Potassium: 4.3 mmol/L (ref 3.5–5.2)
Sodium: 138 mmol/L (ref 134–144)
Total Protein: 6.9 g/dL (ref 6.0–8.5)
eGFR: 109 mL/min/{1.73_m2} (ref >59)

## 2022-08-16 LAB — CBC WITH DIFFERENTIAL/PLATELET
Basophils Absolute: 0 10*3/uL (ref 0.0–0.2)
Basos: 0 %
EOS (ABSOLUTE): 0 10*3/uL (ref 0.0–0.4)
Eos: 0 %
Hematocrit: 36 % (ref 34.0–46.6)
Hemoglobin: 11 g/dL — ABNORMAL LOW (ref 11.1–15.9)
Immature Grans (Abs): 0 10*3/uL (ref 0.0–0.1)
Immature Granulocytes: 0 %
Lymphocytes Absolute: 2.6 10*3/uL (ref 0.7–3.1)
Lymphs: 28 %
MCH: 21.8 pg — ABNORMAL LOW (ref 26.6–33.0)
MCHC: 30.6 g/dL — ABNORMAL LOW (ref 31.5–35.7)
MCV: 71 fL — ABNORMAL LOW (ref 79–97)
Monocytes Absolute: 0.6 10*3/uL (ref 0.1–0.9)
Monocytes: 7 %
Neutrophils Absolute: 5.9 10*3/uL (ref 1.4–7.0)
Neutrophils: 65 %
Platelets: 357 10*3/uL (ref 150–450)
RBC: 5.04 x10E6/uL (ref 3.77–5.28)
RDW: 16.5 % — ABNORMAL HIGH (ref 11.7–15.4)
WBC: 9.2 10*3/uL (ref 3.4–10.8)

## 2022-08-16 LAB — LIPID PANEL
Chol/HDL Ratio: 3.4 ratio (ref 0.0–4.4)
Cholesterol, Total: 141 mg/dL (ref 100–199)
HDL: 41 mg/dL (ref >39)
LDL Chol Calc (NIH): 80 mg/dL (ref 0–99)
Triglycerides: 108 mg/dL (ref 0–149)
VLDL Cholesterol Cal: 20 mg/dL (ref 5–40)

## 2022-08-16 LAB — TSH: TSH: 0.688 u[IU]/mL (ref 0.450–4.500)

## 2022-09-14 ENCOUNTER — Encounter: Payer: Self-pay | Admitting: Physical Medicine & Rehabilitation

## 2022-09-14 ENCOUNTER — Encounter: Payer: Medicare Other | Attending: Physical Medicine & Rehabilitation | Admitting: Physical Medicine & Rehabilitation

## 2022-09-14 VITALS — BP 152/91 | HR 84 | Ht 65.0 in | Wt 229.0 lb

## 2022-09-14 DIAGNOSIS — M1711 Unilateral primary osteoarthritis, right knee: Secondary | ICD-10-CM | POA: Insufficient documentation

## 2022-09-14 DIAGNOSIS — M792 Neuralgia and neuritis, unspecified: Secondary | ICD-10-CM | POA: Insufficient documentation

## 2022-09-14 DIAGNOSIS — I10 Essential (primary) hypertension: Secondary | ICD-10-CM | POA: Insufficient documentation

## 2022-09-14 DIAGNOSIS — M1710 Unilateral primary osteoarthritis, unspecified knee: Secondary | ICD-10-CM | POA: Insufficient documentation

## 2022-09-14 MED ORDER — AMLODIPINE BESYLATE 10 MG PO TABS: 10.0000 mg | ORAL_TABLET | Freq: Every day | ORAL | 3 refills | Status: DC

## 2022-09-14 NOTE — Patient Instructions (Signed)
ALWAYS FEEL FREE TO CALL OUR OFFICE WITH ANY PROBLEMS OR QUESTIONS [phone])  **PLEASE NOTE** ALL MEDICATION REFILL REQUESTS (INCLUDING CONTROLLED SUBSTANCES) NEED TO BE MADE AT LEAST 7 DAYS PRIOR TO REFILL BEING DUE. ANY REFILL REQUESTS INSIDE THAT TIME FRAME MAY RESULT IN DELAYS IN RECEIVING YOUR PRESCRIPTION.    YOU CAN USE OVER-THE-COUNTER DICLOFENAC GEL 3-4X DAILY TO YOUR RIGHT KNEE.  SUPPLEMENTS USEFUL FOR JOINT HEALTH: OMEGA 3 FATTY ACIDS, TURMERIC, GINGER, TART CHERRY EXTRACT, CELERY SEED, GLUCOSAMINE WITH CHONDROITIN     KNEE STRENGTHENING EXERCISES  ICE PACK TO YOUR KNEE  ORDER XRAYS.

## 2022-09-14 NOTE — Progress Notes (Signed)
Subjective:    Patient ID: Joyce [name] Kitchen, female    DOB: [birth year], 47 y.o.   MRN: [id number]  HPI  Joyce [name] is here in follow-up of her thalamic infarct and associated deficits as well as low back pain.  I last saw her in September of last year. She has had increased right knee pain and is seein a pain mgt doctor.  She told me that he has given her 2 different pain medications and apparently some prednisone.  No knee x-rays have been done.  No therapy was prescribed and no other advice given apparently.  The knee tends to bother her a lot at nighttime but also with weightbearing and when she bends the knee.  Sometimes she feels some crepitus in her knee and there is associated swelling.  She does report that the prednisone seemed to help while she was on it.  She has used [employer] with some relief also.  Otherwise her gait has improved.  She is trying to be more balance with her ambulation.  She does still have some back pain but that has been better with improved mechanics as well as with regular stretching.  She reports that her blood pressure has been elevated at home.  She has remained on Norvasc 5 mg daily with diastolics routinely running in the low to mid 90s.  She is trying to exercise and to eat better.  Pain Inventory Average Pain 6 Pain Right Now 7 My pain is sharp, stabbing, tingling, and aching  LOCATION OF PAIN  back, buttocks, hip, knee, toes  BOWEL Number of stools per week: 7 Oral laxative use No  Type of laxative . Enema or suppository use No  History of colostomy No  Incontinent No   BLADDER Normal In and out cath, frequency . Able to self cath  . Bladder incontinence No  Frequent urination No  Leakage with coughing No  Difficulty starting stream No  Incomplete bladder emptying No    Mobility walk without assistance ability to climb steps?  yes do you drive?  yes  Function disabled: date disabled . I need assistance with the following:   toileting  Neuro/Psych weakness numbness tingling  Prior Studies Any changes since last visit?  no  Physicians involved in your care Any changes since last visit?  no   Family History  Problem Relation Age of Onset   Diabetes Mother    Hypertension Mother    Kidney disease Mother    Asthma Mother    Hyperlipidemia Mother    Alzheimer's disease Father    Diabetes Father    Kidney disease Sister    Asthma Son    Diabetes Sister    Asthma Son    ADD / ADHD Son    Heart murmur Son    Asthma Son    Colon cancer Neg Hx    Colon polyps Neg Hx    Social History   Socioeconomic History   Marital status: Significant Other    Spouse name: Audree Bane   Number of children: 8   Years of education: 9   Highest education level: Not on file  Occupational History   Occupation: unemployed    Comment: disabled  Tobacco Use   Smoking status: Never   Smokeless tobacco: Never  Vaping Use   Vaping Use: Never used  Substance and Sexual Activity   Alcohol use: Never   Drug use: No   Sexual activity: Yes    Birth control/protection:  I.U.D.    Comment: followed by GYN  Other Topics Concern   Not on file  Social History Narrative   Lives with fiance and kids   Right Handed   Drinks >12 cans of soda in caffeine   Social Determinants of Health   Financial Resource Strain: Low Risk  (05/11/2022)   Overall Financial Resource Strain (CARDIA)    Difficulty of Paying Living Expenses: Not hard at all  Food Insecurity: No Food Insecurity (05/11/2022)   Hunger Vital Sign    Worried About Running Out of Food in the Last Year: Never true    Ran Out of Food in the Last Year: Never true  Transportation Needs: No Transportation Needs (05/11/2022)   PRAPARE - Administrator, Civil Service (Medical): No    Lack of Transportation (Non-Medical): No  Physical Activity: Not on file  Stress: Not on file  Social Connections: Not on file   Past Surgical History:  Procedure  Laterality Date   CHOLECYSTECTOMY     COLONOSCOPY WITH PROPOFOL N/A 02/09/2022   Procedure: COLONOSCOPY WITH PROPOFOL;  Surgeon: Toney Reil, MD;  Location: [facility] ENDOSCOPY;  Service: Gastroenterology;  Laterality: N/A;   Past Medical History:  Diagnosis Date   Acute CVA (cerebrovascular accident) (HCC) 03/04/2020   Acute left-sided weakness 03/05/2020   Anemia    Depression    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   Hypertension    Lumbar radiculitis 06/23/2021   Migraine    PTSD (post-traumatic stress disorder) 05/01/2017   Round ligament pain 11/12/2014   BP (!) 152/91   Pulse 84   Ht  (1.651 m)   Wt 229 lb (103.9 kg)   SpO2 99%   BMI 38.11 kg/m   Opioid Risk Score:   Fall Risk Score:  `1  Depression screen [facility] 2/9     08/15/2022    4:17 PM 05/11/2022    3:25 PM 01/11/2022    3:05 PM 01/05/2022    3:06 PM 10/13/2021    2:06 PM 10/06/2021    2:39 PM 06/23/2021    2:52 PM  Depression screen PHQ 2/9  Decreased Interest 0 Down, Depressed, Hopeless 0 PHQ - 2 Score 0 Altered sleeping Tired, decreased energy Change in appetite 0 0 0   0   Feeling bad or failure about yourself  0 Trouble concentrating Moving slowly or fidgety/restless 0 1 0   2   Suicidal thoughts 0 0 0   0   PHQ-9 Score Difficult doing work/chores Very difficult Somewhat difficult Very difficult   Very difficult      Review of Systems  Musculoskeletal:  Positive for back pain.       Buttocks pain Hip pain Knee pain Toe pain  Neurological:  Positive for weakness and numbness.  All other systems reviewed and are negative.     Objective:   Physical Exam General: No acute distress, obese HEENT: NCAT, EOMI, oral membranes moist Cards: reg rate  Chest: normal effort Abdomen: Soft, NT, ND Skin: dry, intact Extremities: no edema Psych: pleasant and appropriate  Neuro Alert and oriented x 3.  Normal insight and awareness. Intact  Memory. Normal language and speech. Cranial nerve exam unremarkable except for left facial sensory loss.  Sensory 1+/2 LUE and LLE ongoing. Motor 4+/5 on left 5/5 on right.  No significant changes here.  Weight shift is much improved from prior visit.  Musculoskeletal: Patient with mild low back pain.  She seems to be moving better with sit to stand transfers as well as flexion extension of the lumbar spine.  She does favor the right knee during weightbearing.  There is decreased swelling overall of the right knee during swing phase.  There is mild crepitus at the knee with palpation.  There is joint line tenderness and McMurray's test is slightly positive on the lateral aspect of the right knee.       Assessment & Plan:  1.  Left-sided hemiparesis secondary to right thalamic infarction             -Secondary stroke prophylaxis with  aspirin 325 mg daily             -She has made improvements with her gait mechanics             2.  Pain management/history of migraines               -lyrica for headaches and pain.  3.  Right knee pain:   -Diclofenac gel  -supps for OA  -knee strengthening exercises  -ice  -xrays.   -Consider outpatient physical therapy and joint injection 4.  Spasticity: controlled Baclofen 5 mg 3 times daily----DC 5.  Hypertension: Increase Norvasc to 10 mg daily 6. Low back pain-patient has spondylosis and facet arthropathy on her most recent x-ray from last year.  MRI really unremarkable.             -related to gait mechanics.  -tizanidine ongoing    7.  Left elbow pain consistent with lateral epicondylitis             --improved.    20 minutes of face to face patient care time were spent during this visit. All questions were encouraged and answered.  Follow up with me in 2 mos .

## 2022-09-17 ENCOUNTER — Emergency Department (HOSPITAL_COMMUNITY): Payer: Medicare Other

## 2022-09-17 ENCOUNTER — Observation Stay (HOSPITAL_COMMUNITY)
Admission: EM | Admit: 2022-09-17 | Discharge: 2022-09-19 | Disposition: A | Discharge status: Home / Self Care | Payer: Medicare Other | Attending: Internal Medicine | Admitting: Internal Medicine

## 2022-09-17 ENCOUNTER — Other Ambulatory Visit: Payer: Self-pay

## 2022-09-17 DIAGNOSIS — I69354 Hemiplegia and hemiparesis following cerebral infarction affecting left non-dominant side: Secondary | ICD-10-CM | POA: Diagnosis not present

## 2022-09-17 DIAGNOSIS — R202 Paresthesia of skin: Principal | ICD-10-CM

## 2022-09-17 DIAGNOSIS — Z79899 Other long term (current) drug therapy: Secondary | ICD-10-CM | POA: Diagnosis not present

## 2022-09-17 DIAGNOSIS — R200 Anesthesia of skin: Secondary | ICD-10-CM | POA: Diagnosis not present

## 2022-09-17 DIAGNOSIS — E782 Mixed hyperlipidemia: Secondary | ICD-10-CM | POA: Insufficient documentation

## 2022-09-17 DIAGNOSIS — R519 Headache, unspecified: Secondary | ICD-10-CM | POA: Diagnosis present

## 2022-09-17 DIAGNOSIS — Z8673 Personal history of transient ischemic attack (TIA), and cerebral infarction without residual deficits: Secondary | ICD-10-CM

## 2022-09-17 DIAGNOSIS — I10 Essential (primary) hypertension: Secondary | ICD-10-CM | POA: Diagnosis present

## 2022-09-17 LAB — CBC
HCT: 34.4 % — ABNORMAL LOW (ref 36.0–46.0)
Hemoglobin: 10.5 g/dL — ABNORMAL LOW (ref 12.0–15.0)
MCH: 21.2 pg — ABNORMAL LOW (ref 26.0–34.0)
MCHC: 30.5 g/dL (ref 30.0–36.0)
MCV: 69.5 fL — ABNORMAL LOW (ref 80.0–100.0)
Platelets: 350 10*3/uL (ref 150–400)
RBC: 4.95 MIL/uL (ref 3.87–5.11)
RDW: 18.6 % — ABNORMAL HIGH (ref 11.5–15.5)
WBC: 10.6 10*3/uL — ABNORMAL HIGH (ref 4.0–10.5)
nRBC: 0 % (ref 0.0–0.2)

## 2022-09-17 LAB — URINALYSIS, ROUTINE W REFLEX MICROSCOPIC
Bilirubin Urine: NEGATIVE
Glucose, UA: NEGATIVE mg/dL
Ketones, ur: NEGATIVE mg/dL
Nitrite: NEGATIVE
Protein, ur: NEGATIVE mg/dL
Specific Gravity, Urine: 1.024 (ref 1.005–1.030)
pH: 5 (ref 5.0–8.0)

## 2022-09-17 LAB — COMPREHENSIVE METABOLIC PANEL
ALT: 18 U/L (ref 0–44)
AST: 19 U/L (ref 15–41)
Albumin: 3.8 g/dL (ref 3.5–5.0)
Alkaline Phosphatase: 40 U/L (ref 38–126)
Anion gap: 9 (ref 5–15)
BUN: 12 mg/dL (ref 6–20)
CO2: 23 mmol/L (ref 22–32)
Calcium: 9.1 mg/dL (ref 8.9–10.3)
Chloride: 102 mmol/L (ref 98–111)
Creatinine, Ser: 0.71 mg/dL (ref 0.44–1.00)
GFR, Estimated: >60 mL/min (ref >60)
Glucose, Bld: 90 mg/dL (ref 70–99)
Potassium: 3.6 mmol/L (ref 3.5–5.1)
Sodium: 134 mmol/L — ABNORMAL LOW (ref 135–145)
Total Bilirubin: 0.4 mg/dL (ref 0.3–1.2)
Total Protein: 7.8 g/dL (ref 6.5–8.1)

## 2022-09-17 LAB — PROTIME-INR
INR: 1.1 (ref 0.8–1.2)
Prothrombin Time: 13.6 seconds (ref 11.4–15.2)

## 2022-09-17 LAB — DIFFERENTIAL
Abs Immature Granulocytes: 0.02 10*3/uL (ref 0.00–0.07)
Basophils Absolute: 0.1 10*3/uL (ref 0.0–0.1)
Basophils Relative: 1 %
Eosinophils Absolute: 0.1 10*3/uL (ref 0.0–0.5)
Eosinophils Relative: 1 %
Immature Granulocytes: 0 %
Lymphocytes Relative: 32 %
Lymphs Abs: 3.4 10*3/uL (ref 0.7–4.0)
Monocytes Absolute: 0.7 10*3/uL (ref 0.1–1.0)
Monocytes Relative: 6 %
Neutro Abs: 6.4 10*3/uL (ref 1.7–7.7)
Neutrophils Relative %: 60 %

## 2022-09-17 LAB — POC URINE PREG, ED: Preg Test, Ur: NEGATIVE

## 2022-09-17 LAB — ETHANOL: Alcohol, Ethyl (B): <10 mg/dL (ref <10)

## 2022-09-17 LAB — APTT: aPTT: 30 seconds (ref 24–36)

## 2022-09-17 LAB — CBG MONITORING, ED: Glucose-Capillary: 93 mg/dL (ref 70–99)

## 2022-09-17 MED ORDER — PROCHLORPERAZINE EDISYLATE 10 MG/2ML IJ SOLN
10.0000 mg | Freq: Once | INTRAMUSCULAR | Status: AC
  Administered 2022-09-17: 10 mg via INTRAVENOUS
  Filled 2022-09-17: qty 2

## 2022-09-17 MED ORDER — DIPHENHYDRAMINE HCL 50 MG/ML IJ SOLN
25.0000 mg | Freq: Once | INTRAMUSCULAR | Status: AC
  Administered 2022-09-17: 25 mg via INTRAVENOUS
  Filled 2022-09-17: qty 1

## 2022-09-17 NOTE — ED Notes (Signed)
Istat Chem 8 failed to run test. Edit sheet completed. Blood sample sent to main lab for testing.

## 2022-09-17 NOTE — ED Notes (Signed)
ED Provider at bedside. 

## 2022-09-17 NOTE — ED Notes (Signed)
Pt returned from CT Scan 

## 2022-09-17 NOTE — ED Triage Notes (Signed)
Pt c/o headache that started approx. Noon today. Pt states her left hand started going numb about an hour ago and feels lightheaded.   Took tylenol with no relief.

## 2022-09-18 ENCOUNTER — Observation Stay (HOSPITAL_BASED_OUTPATIENT_CLINIC_OR_DEPARTMENT_OTHER): Payer: Medicare Other

## 2022-09-18 DIAGNOSIS — E782 Mixed hyperlipidemia: Secondary | ICD-10-CM | POA: Diagnosis not present

## 2022-09-18 DIAGNOSIS — Z8673 Personal history of transient ischemic attack (TIA), and cerebral infarction without residual deficits: Secondary | ICD-10-CM

## 2022-09-18 DIAGNOSIS — I6381 Other cerebral infarction due to occlusion or stenosis of small artery: Secondary | ICD-10-CM

## 2022-09-18 DIAGNOSIS — R202 Paresthesia of skin: Secondary | ICD-10-CM | POA: Diagnosis not present

## 2022-09-18 DIAGNOSIS — I10 Essential (primary) hypertension: Secondary | ICD-10-CM

## 2022-09-18 DIAGNOSIS — R29818 Other symptoms and signs involving the nervous system: Secondary | ICD-10-CM | POA: Insufficient documentation

## 2022-09-18 LAB — COMPREHENSIVE METABOLIC PANEL
ALT: 15 U/L (ref 0–44)
AST: 18 U/L (ref 15–41)
Albumin: 3.4 g/dL — ABNORMAL LOW (ref 3.5–5.0)
Alkaline Phosphatase: 35 U/L — ABNORMAL LOW (ref 38–126)
Anion gap: 7 (ref 5–15)
BUN: 8 mg/dL (ref 6–20)
CO2: 22 mmol/L (ref 22–32)
Calcium: 8.8 mg/dL — ABNORMAL LOW (ref 8.9–10.3)
Chloride: 106 mmol/L (ref 98–111)
Creatinine, Ser: 0.67 mg/dL (ref 0.44–1.00)
GFR, Estimated: >60 mL/min (ref >60)
Glucose, Bld: 84 mg/dL (ref 70–99)
Potassium: 3.5 mmol/L (ref 3.5–5.1)
Sodium: 135 mmol/L (ref 135–145)
Total Bilirubin: 0.7 mg/dL (ref 0.3–1.2)
Total Protein: 7 g/dL (ref 6.5–8.1)

## 2022-09-18 LAB — HEMOGLOBIN A1C
Hgb A1c MFr Bld: 5.5 % (ref 4.8–5.6)
Mean Plasma Glucose: 111.15 mg/dL

## 2022-09-18 LAB — ECHOCARDIOGRAM COMPLETE
Area-P 1/2: 4.21 cm2
Height: 65 in
S' Lateral: 3 cm
Weight: 3682.56 oz

## 2022-09-18 LAB — CBC
HCT: 33.8 % — ABNORMAL LOW (ref 36.0–46.0)
Hemoglobin: 10.3 g/dL — ABNORMAL LOW (ref 12.0–15.0)
MCH: 21.2 pg — ABNORMAL LOW (ref 26.0–34.0)
MCHC: 30.5 g/dL (ref 30.0–36.0)
MCV: 69.5 fL — ABNORMAL LOW (ref 80.0–100.0)
Platelets: 315 10*3/uL (ref 150–400)
RBC: 4.86 MIL/uL (ref 3.87–5.11)
RDW: 18 % — ABNORMAL HIGH (ref 11.5–15.5)
WBC: 7.8 10*3/uL (ref 4.0–10.5)
nRBC: 0 % (ref 0.0–0.2)

## 2022-09-18 LAB — LIPID PANEL
Cholesterol: 119 mg/dL (ref 0–200)
HDL: 37 mg/dL — ABNORMAL LOW (ref >40)
LDL Cholesterol: 68 mg/dL (ref 0–99)
Total CHOL/HDL Ratio: 3.2 RATIO
Triglycerides: 68 mg/dL (ref <150)
VLDL: 14 mg/dL (ref 0–40)

## 2022-09-18 LAB — PHOSPHORUS: Phosphorus: 3.7 mg/dL (ref 2.5–4.6)

## 2022-09-18 LAB — HIV ANTIBODY (ROUTINE TESTING W REFLEX): HIV Screen 4th Generation wRfx: NONREACTIVE

## 2022-09-18 LAB — MAGNESIUM: Magnesium: 1.7 mg/dL (ref 1.7–2.4)

## 2022-09-18 MED ORDER — PROCHLORPERAZINE EDISYLATE 10 MG/2ML IJ SOLN
10.0000 mg | Freq: Three times a day (TID) | INTRAMUSCULAR | Status: DC | PRN
  Administered 2022-09-18: 10 mg via INTRAVENOUS
  Filled 2022-09-18: qty 2

## 2022-09-18 MED ORDER — DIPHENHYDRAMINE HCL 25 MG PO CAPS
25.0000 mg | ORAL_CAPSULE | Freq: Three times a day (TID) | ORAL | Status: DC | PRN
  Administered 2022-09-18 (×2): 25 mg via ORAL
  Filled 2022-09-18 (×2): qty 1

## 2022-09-18 MED ORDER — ROSUVASTATIN CALCIUM 10 MG PO TABS
10.0000 mg | ORAL_TABLET | Freq: Every day | ORAL | Status: DC
  Administered 2022-09-18 – 2022-09-19 (×2): 10 mg via ORAL
  Filled 2022-09-18 (×2): qty 1

## 2022-09-18 MED ORDER — ACETAMINOPHEN 325 MG PO TABS
650.0000 mg | ORAL_TABLET | Freq: Four times a day (QID) | ORAL | Status: DC | PRN
  Administered 2022-09-18: 650 mg via ORAL
  Filled 2022-09-18: qty 2

## 2022-09-18 NOTE — ED Notes (Signed)
Hospitalist at bedside 

## 2022-09-18 NOTE — H&P (Signed)
History and Physical    Patient: Joyce [name] ZOX:[id number] DOB: 04-06-1976 DOA: 09/17/2022 DOS: the patient was seen and examined on 09/18/2022 PCP: [name], MD  Patient coming from: Home  Chief Complaint:  Chief Complaint  Patient presents with   Headache   HPI: Joyce [name] is a 47 y.o. female with medical history significant of hypertension, hyperlipidemia, prior stroke with mild left-sided residual weakness, migraine who presents to the emergency department due to headache and left arm numbness which started yesterday.  Patient complained of tingling sensation at left fingertips and numbness in left arm, she took Tylenol without any relief, so she decided to present to the ED for further evaluation and management.   ED Course:  In the emergency department, BP was 153/74, other vital signs are within normal range.  Workup in the ED showed CBC except WBC of 10.6 and MCV of 69.5.  BMP was normal except for sodium of 134.  Alcohol level was less than 10, urinalysis was unimpressive for UTI. CT head without contrast showed no acute intracranial abnormality.  Small chronic right-sided para thalamic lacunar infarct. She was treated with Benadryl and Compazine, teleneurology was consulted and recommended further stroke workup.  Hospitalist was asked to admit patient for further evaluation and management.  Review of Systems: Review of systems as noted in the HPI. All other systems reviewed and are negative.   Past Medical History:  Diagnosis Date   Acute CVA (cerebrovascular accident) 03/04/2020   Acute left-sided weakness 03/05/2020   Anemia    Depression    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   Hypertension    Lumbar radiculitis 06/23/2021   Migraine    PTSD (post-traumatic stress disorder) 05/01/2017   Round ligament pain 11/12/2014   Past Surgical History:  Procedure Laterality Date   CHOLECYSTECTOMY     COLONOSCOPY WITH PROPOFOL N/A 02/09/2022   Procedure:  COLONOSCOPY WITH PROPOFOL;  Surgeon: Toney Reil, MD;  Location[facility] ENDOSCOPY;  Service: Gastroenterology;  Laterality: N/A;    Social History:  reports that she has never smoked. She has never used smokeless tobacco. She reports that she does not drink alcohol and does not use drugs.   No Known Allergies  Family History  Problem Relation Age of Onset   Diabetes Mother    Hypertension Mother    Kidney disease Mother    Asthma Mother    Hyperlipidemia Mother    Alzheimer's disease Father    Diabetes Father    Kidney disease Sister    Asthma Son    Diabetes Sister    Asthma Son    ADD / ADHD Son    Heart murmur Son    Asthma Son    Colon cancer Neg Hx    Colon polyps Neg Hx      Prior to Admission medications   Medication Sig Start Date End Date Taking? Authorizing Provider  amLODipine (NORVASC) 10 MG tablet Take 1 tablet (10 mg total) by mouth daily. 09/14/22 09/14/23  Ranelle Oyster, MD  fluticasone (FLONASE) 50 MCG/ACT nasal spray Use 2 sprays in each nostril BID for a week. After 1 week, decrease to 1 spray in each nostril BID as needed for congestion/allergies. 10/06/20   Heather Roberts, NP  ibuprofen (ADVIL) 600 MG tablet Take 1 tablet (600 mg total) by mouth every 6 (six) hours as needed. 06/07/20   Burgess Amor, PA-C  PARAGARD INTRAUTERINE COPPER IU by Intrauterine route.    [provider]  pregabalin (LYRICA) 25 MG capsule Take 25 mg by mouth 3 (three) times daily. 08/11/22   [provider]  rosuvastatin (CRESTOR) 10 MG tablet Take 1 tablet (10 mg total) by mouth daily. 08/15/22   [name], MD  tiZANidine (ZANAFLEX) 4 MG tablet Take 4 mg by mouth 2 (two) times daily. 09/02/22   [provider]  traZODone (DESYREL) 100 MG tablet Take 100 mg by mouth at bedtime. 03/31/20   [provider]    Physical Exam: BP 111/67   Pulse 70   Temp 98.2 F (36.8 C) (Oral)   Resp 16   Ht  (1.651 m)   Wt 104 kg   SpO2 100%    BMI 38.15 kg/m   General: 47 y.o. year-old female well developed well nourished in no acute distress.  Alert and oriented x3. HEENT: NCAT, EOMI Neck: Supple, trachea medial Cardiovascular: Regular rate and rhythm with no rubs or gallops.  No thyromegaly or JVD noted.  No lower extremity edema. 2/4 pulses in all 4 extremities. Respiratory: Clear to auscultation with no wheezes or rales. Good inspiratory effort. Abdomen: Soft, nontender nondistended with normal bowel sounds x4 quadrants. Muskuloskeletal: No cyanosis, clubbing or edema noted bilaterally Neuro: CN II-XII intact, LUE hand grip 3/5, RUE and grip 5/5.  Decreased sensation in LUE,  Skin: No ulcerative lesions noted or rashes Psychiatry: Judgement and insight appear normal. Mood is appropriate for condition and setting          Labs on Admission:  Basic Metabolic Panel: Recent Labs  Lab 09/17/22 2150  NA 134*  K 3.6  CL 102  CO2 23  GLUCOSE 90  BUN 12  CREATININE 0.71  CALCIUM 9.1   Liver Function Tests: Recent Labs  Lab 09/17/22 2150  AST 19  ALT 18  ALKPHOS 40  BILITOT 0.4  PROT 7.8  ALBUMIN 3.8   No results for input(s): "LIPASE", "AMYLASE" in the last 168 hours. No results for input(s): "AMMONIA" in the last 168 hours. CBC: Recent Labs  Lab 09/17/22 2150  WBC 10.6*  NEUTROABS 6.4  HGB 10.5*  HCT 34.4*  MCV 69.5*  PLT 350   Cardiac Enzymes: No results for input(s): "CKTOTAL", "CKMB", "CKMBINDEX", "TROPONINI" in the last 168 hours.  BNP (last 3 results) No results for input(s): "BNP" in the last 8760 hours.  ProBNP (last 3 results) No results for input(s): "PROBNP" in the last 8760 hours.  CBG: Recent Labs  Lab 09/17/22 2145  GLUCAP 93    Radiological Exams on Admission: CT HEAD WO CONTRAST  Result Date: 09/17/2022 CLINICAL DATA:  Headache and left hand numbness. EXAM: CT HEAD WITHOUT CONTRAST TECHNIQUE: Contiguous axial images were obtained from the base of the skull through the  vertex without intravenous contrast. RADIATION DOSE REDUCTION: This exam was performed according to the departmental dose-optimization program which includes automated exposure control, adjustment of the mA and/or kV according to patient size and/or use of iterative reconstruction technique. COMPARISON:  May 20, 2020 FINDINGS: Brain: No evidence of acute infarction, hemorrhage, hydrocephalus, extra-axial collection or mass lesion/mass effect. A small chronic right-sided para thalamic lacunar infarct is noted. Vascular: No hyperdense vessel or unexpected calcification. Skull: Normal. Negative for fracture or focal lesion. Sinuses/Orbits: No acute finding. Other: None. IMPRESSION: 1. No acute intracranial abnormality. 2. Small chronic right-sided para thalamic lacunar infarct. Electronically Signed   By: Aram Candela M.D.   On: 09/17/2022 22:18    EKG: I independently viewed the EKG done  and my findings are as followed: Normal sinus rhythm at rate of 65 bpm  Assessment/Plan Present on Admission:  Essential hypertension  Principal Problem:   Paresthesia of skin Active Problems:   Essential hypertension   History of stroke   Mixed hyperlipidemia  Skin paresthesia  This may be due to TIA/stroke/migraine with aura Patient was treated with Benadryl and Compazine, we shall continue same at this time Patient will be admitted to telemetry unit  Echocardiogram in the morning MRI of brain without contrast in the morning Continue fall precautions and neuro checks Lipid panel and hemoglobin A1c will be checked Continue PT/OT eval and treat Bedside swallow eval by nursing prior to diet Consider tele neurology consult status post imaging studies  Essential hypertension  Antihypertensives PRN if Blood pressure is greater than 220/120 or there is a concern for End organ damage/contraindications for permissive HTN. If blood pressure is greater than 220/120 give labetalol PO or IV or Vasotec IV  with a goal of 15% reduction in BP during the first 24 hours. Consider normotension with negative MRI of brain  Mixed hyperlipidemia Continue statin  History of prior stroke Continue statin Consider starting aspirin 8 mg p.o. daily for negative MRI brain  DVT prophylaxis: SCDs  Advance Care Planning: Full code  Consults: None  Family Communication: None at bedside  Severity of Illness: The appropriate patient status for this patient is OBSERVATION. Observation status is judged to be reasonable and necessary in order to provide the required intensity of service to ensure the patient's safety. The patient's presenting symptoms, physical exam findings, and initial radiographic and laboratory data in the context of their medical condition is felt to place them at decreased risk for further clinical deterioration. Furthermore, it is anticipated that the patient will be medically stable for discharge from the hospital within 2 midnights of admission.   Author: Frankey Shown, DO 09/18/2022 6:27 AM  For on call review [email or web address].

## 2022-09-18 NOTE — Progress Notes (Signed)
Subjective: Patient admitted this morning, see detailed H&P by Dr Thomes Dinning 47 y.o. female with medical history significant of hypertension, hyperlipidemia, prior stroke with mild left-sided residual weakness, migraine who presents to the emergency department due to headache and left arm numbness which started yesterday.  Patient complained of tingling sensation at left fingertips and numbness in left arm. CT head without contrast showed no acute intracranial abnormality.  Small chronic right-sided para thalamic lacunar infarct.  MRI brain ordered  Vitals:   09/18/22 0800 09/18/22 0803  BP: 126/72   Pulse: 71   Resp: 18   Temp:  98.3 F (36.8 C)  SpO2: 100%       A/P  Left arm numbness -Could be TIA/stroke/migraine aura -Echocardiogram ordered -Teleneurology was consulted, recommend MRI brain without contrast -Follow-up MRI brain without contrast -follow lipid panel, hemoglobin A1c  Hypertension -Permissive hypertension -Treat for BP greater than 220/120  Hyperlipidemia -Continue statin  History of prior stroke -Continue statin -She is not on aspirin at home  Meredeth Ide [facility]

## 2022-09-18 NOTE — Progress Notes (Signed)
Pt arrived to room 318 via WC from ED. Ambulatory from [facility] to bed with standby assist.  Tele neuro number: [phone]

## 2022-09-18 NOTE — Evaluation (Signed)
Physical Therapy Evaluation Patient Details Name: Joyce [name] MRN: [id number] DOB: [birth year] Today's Date: 09/18/2022  History of Present Illness  Joyce [name] is a 47 y.o. female with medical history significant of hypertension, hyperlipidemia, prior stroke with mild left-sided residual weakness, migraine who presents to the emergency department due to headache and left arm numbness which started yesterday.  Patient complained of tingling sensation at left fingertips and numbness in left arm, she took Tylenol without any relief  Clinical Impression  Pt Mod I but feels like she is having trouble picking up her LT leg, recommend a cane as well as OP PT.       Recommendations for follow up therapy are one component of a multi-disciplinary discharge planning process, led by the attending physician.  Recommendations may be updated based on patient status, additional functional criteria and insurance authorization.  Follow Up Recommendations       Assistance Recommended at Discharge PRN  Patient can return home with the following  Assistance with cooking/housework;Assist for transportation;Help with stairs or ramp for entrance    Equipment Recommendations Cane  Recommendations for Other Services       Functional Status Assessment       Precautions / Restrictions Precautions Precautions: None Restrictions Weight Bearing Restrictions: No      Mobility  Bed Mobility Overal bed mobility: Modified Independent                  Transfers Overall transfer level: Modified independent Equipment used: None                    Ambulation/Gait Ambulation/Gait assistance: Modified independent (Device/Increase time) Gait Distance (Feet): 200 Feet Assistive device: None Gait Pattern/deviations: Step-through pattern            Balance                                             Pertinent Vitals/Pain Pain Assessment Pain Assessment:  0-10 Pain Score: 9  Pain Location: back Pain Descriptors / Indicators: Aching Pain Intervention(s): Limited activity within patient's tolerance    Home Living Family/patient expects to be discharged to:: Private residence Living Arrangements: Children Available Help at Discharge: Family;Available 24 hours/day Type of Home: House Home Access: Stairs to enter   [employer] of Steps: 4   Home Layout: Able to live on main level with bedroom/bathroom Home Equipment: Agricultural consultant (2 wheels)      Prior Function Prior Level of Function : Independent/Modified Independent                     Hand Dominance        Extremity/Trunk Assessment        Lower Extremity Assessment Lower Extremity Assessment: Overall WFL for tasks assessed       Communication      Cognition Arousal/Alertness: Awake/alert Behavior During Therapy: WFL for tasks assessed/performed Overall Cognitive Status: Within Functional Limits for tasks assessed                                          General Comments      Exercises General Exercises - Lower Extremity Ankle Circles/Pumps: Both, 10 reps, Seated Hip ABduction/ADduction: Both, 10 reps, Seated Hip Flexion/Marching:  Both, 10 reps, Seated Toe Raises: Both, 10 reps, Seated   Assessment/Plan    PT Assessment All further PT needs can be met in the next venue of care  PT Problem List Decreased strength;Decreased activity tolerance;Decreased balance       PT Treatment Interventions   eval   PT Goals (Current goals can be found in the Care Plan section)   To go home     Frequency  2x        AM-PAC PT "6 Clicks" Mobility  Outcome Measure Help needed turning from your back to your side while in a flat bed without using bedrails?: None Help needed moving from lying on your back to sitting on the side of a flat bed without using bedrails?: None Help needed moving to and from a bed to a chair (including a  wheelchair)?: None Help needed standing up from a chair using your arms (e.g., wheelchair or bedside chair)?: None Help needed to walk in hospital room?: None Help needed climbing 3-5 steps with a railing? : A Little 6 Click Score: 23    End of Session Equipment Utilized During Treatment: Gait belt Activity Tolerance: Patient tolerated treatment well Patient left: in chair;with call bell/phone within reach Nurse Communication: Mobility status PT Visit Diagnosis: Unsteadiness on feet (R26.81)    Time: 9604-5409 PT Time Calculation (min) (ACUTE ONLY): 28 min   Charges:   PT Evaluation $PT Eval Low Complexity: 1 Low           Virgina Organ, PT CLT [phone]  09/18/2022, 10:43 AM

## 2022-09-18 NOTE — Progress Notes (Signed)
  Echocardiogram 2D Echocardiogram has been performed.  Joyce [name] 09/18/2022, 12:19 PM

## 2022-09-18 NOTE — Consult Note (Signed)
[facility] Tele[facility]urology Consult Services  Stat Consult  Patient Name:   Joyce [name], Joyce [name] Date of Birth:   1975/08/09 Identification Number:   MRN - [id number] Date of Service:   09/17/2022 21:53:14  Diagnosis:       R20.2 - Paresthesia of skin  Impression Joyce [name] is a 47 yo F w/ a hx of HTN who presents with HA and left arm numbness. Exam notable for left hemisensory disturbance. Head CT showed a chronic right parathalamic lacunar infarct. Ddx includes migraine w/ aura vs TIA/ischemic stroke. Recommend the following: - ASA 325 mg once - MRI brain w/o contrast - TTE, telemetry, lipid panel, hemoglobin A1c - if no acute infarct, goal BP is normotension - Benadryl and compazine q8 prn for HA - PT/OT - Neurology to follow   Recommendations: Our recommendations are outlined below.  Laboratory Studies : Lipid panel I ordered Hemoglobin A1c  Nursing Recommendations : IV Fluids, avoid dextrose containing fluids, Maintain euglycemiaNeuro checks q4 hrs x 24 hrs and then per shiftHead of bed 30 degreesContinue with Telemetry  Consultations : Recommend Speech therapy if failed dysphagia screenPhysical therapy/Occupational therapy   ----------------------------------------------------------------------------------------------------   Advanced Imaging: Advanced Imaging Deferred because:  Non-disabling symptoms as verified by the patient; no cortical signs so not consistent with LVO    Metrics: [facility] Notification Time: 09/17/2022 21:50:52 Stamp Time: 09/17/2022 21:53:14 Callback Response Time: 09/17/2022 21:54:58  Primary Provider Notified of Diagnostic Impression and Management Plan on: 09/17/2022 23:10:00   CT HEAD: As Per Radiologist CT Head Showed No Acute Hemorrhage or Acute Core Infarct   Imaging Head CT showed no acute intracranial  pathology   ----------------------------------------------------------------------------------------------------  Chief Complaint: headache and left arm numbness  History of Present Illness: Patient is a 47 year old Female. Joyce [name] is a 47 yo F w/ a hx of HTN who presents with HA and left arm numbness. HA began around noon. Numbness began around 2100. Denies any weakness. Denies similar symptoms in the past.    Past Medical History:      Hypertension Other PMH:  TIA  Medications:  No Anticoagulant use  No Antiplatelet use Reviewed EMR for current medications  Allergies:  Reviewed  Social History: Smoking: No Alcohol Use: No Drug Use: No  Family History:  There is no family history of premature cerebrovascular disease pertinent to this consultation  ROS : 14 Points Review of Systems was performed and was negative except mentioned in HPI.  Past Surgical History: There Is No Surgical History Contributory To Today's Visit   Examination: BP(152/91), Pulse(84), Blood Glucose(90) 1A: Level of Consciousness - Alert; keenly responsive + 0 1B: Ask Month and Age - Both Questions Right + 0 1C: Blink Eyes & Squeeze Hands - Performs Both Tasks + 0 2: Test Horizontal Extraocular Movements - Normal + 0 3: Test Visual Fields - No Visual Loss + 0 4: Test Facial Palsy (Use Grimace if Obtunded) - Normal symmetry + 0 5A: Test Left Arm Motor Drift - No Drift for 10 Seconds + 0 5B: Test Right Arm Motor Drift - No Drift for 10 Seconds + 0 6A: Test Left Leg Motor Drift - No Drift for 5 Seconds + 0 6B: Test Right Leg Motor Drift - No Drift for 5 Seconds + 0 7: Test Limb Ataxia (FNF/Heel-Shin) - No Ataxia + 0 8: Test Sensation - Normal; No sensory loss + 0 9: Test Language/Aphasia - Normal; No aphasia + 0 10: Test Dysarthria - Normal + 0 11: Test Extinction/Inattention -  No abnormality + 0  NIHSS Score: 0  Spoke with : ED Provider    Patient / Family was informed the  Neurology Consult would occur via TeleHealth consult by way of interactive audio and video telecommunications and consented to receiving care in this manner.  Patient is being evaluated for possible acute neurologic impairment and high probability of imminent or life - threatening deterioration.I spent total of 35 minutes providing care to this patient, including time for face to face visit via telemedicine, review of medical records, imaging studies and discussion of findings with providers, the patient and / or family.   Dr Vicente Masson   [facility] For Inpatient follow-up with [facility] physician please call [facility] [phone]. This is not an outpatient service. Post hospital discharge, please contact hospital directly.  Please do not communicate with [facility] physicians via secure chat. If you have any questions, Please contact [facility]. Please call or reconsult our service if there are any clinical or diagnostic changes.

## 2022-09-18 NOTE — ED Provider Notes (Signed)
[place] EMERGENCY DEPARTMENT AT [facility] Provider Note   CSN: [id number] Arrival date & time: 09/17/22  2121     History {Add pertinent medical, surgical, social history, OB history to HPI:1} Chief Complaint  Patient presents with   Headache    Joyce [name] is a 47 y.o. female.  HPI    47 year old female comes in with chief complaint of headache Home Medications Prior to Admission medications   Medication Sig Start Date End Date Taking? Authorizing Provider  amLODipine (NORVASC) 10 MG tablet Take 1 tablet (10 mg total) by mouth daily. 09/14/22 09/14/23  Ranelle Oyster, MD  fluticasone (FLONASE) 50 MCG/ACT nasal spray Use 2 sprays in each nostril BID for a week. After 1 week, decrease to 1 spray in each nostril BID as needed for congestion/allergies. 10/06/20   Heather Roberts, NP  ibuprofen (ADVIL) 600 MG tablet Take 1 tablet (600 mg total) by mouth every 6 (six) hours as needed. 06/07/20   Burgess Amor, PA-C  PARAGARD INTRAUTERINE COPPER IU by Intrauterine route.    [provider]  pregabalin (LYRICA) 25 MG capsule Take 25 mg by mouth 3 (three) times daily. 08/11/22   [provider]  rosuvastatin (CRESTOR) 10 MG tablet Take 1 tablet (10 mg total) by mouth daily. 08/15/22   [name], MD  tiZANidine (ZANAFLEX) 4 MG tablet Take 4 mg by mouth 2 (two) times daily. 09/02/22   [provider]  traZODone (DESYREL) 100 MG tablet Take 100 mg by mouth at bedtime. 03/31/20   [provider]      Allergies    Patient has no known allergies.    Review of Systems   Review of Systems  Physical Exam Updated Vital Signs BP 121/68   Pulse (!) 59   Temp 98.2 F (36.8 C) (Oral)   Resp 16   Ht  (1.651 m)   Wt 104 kg   SpO2 99%   BMI 38.15 kg/m  Physical Exam  ED Results / Procedures / Treatments   Labs (all labs ordered are listed, but only abnormal results are displayed) Labs Reviewed  CBC - Abnormal; Notable for  the following components:      Result Value   WBC 10.6 (*)    Hemoglobin 10.5 (*)    HCT 34.4 (*)    MCV 69.5 (*)    MCH 21.2 (*)    RDW 18.6 (*)    All other components within normal limits  COMPREHENSIVE METABOLIC PANEL - Abnormal; Notable for the following components:   Sodium 134 (*)    All other components within normal limits  URINALYSIS, ROUTINE W REFLEX MICROSCOPIC - Abnormal; Notable for the following components:   APPearance HAZY (*)    Hgb urine dipstick MODERATE (*)    Leukocytes,Ua SMALL (*)    Bacteria, UA RARE (*)    All other components within normal limits  PROTIME-INR  APTT  DIFFERENTIAL  ETHANOL  POC URINE PREG, ED  I-STAT CHEM 8, ED  CBG MONITORING, ED    EKG EKG Interpretation  Date/Time:  Saturday September 17 2022 21:50:51 EDT Ventricular Rate:  65 PR Interval:  182 QRS Duration: 81 QT Interval:  389 QTC Calculation: 405 R Axis:   54 Text Interpretation: Sinus rhythm No acute changes No significant change since last tracing Confirmed by Derwood Kaplan ([id number]) on 09/17/2022 10:40:25 PM  Radiology CT HEAD WO CONTRAST  Result Date: 09/17/2022 CLINICAL DATA:  Headache and left  hand numbness. EXAM: CT HEAD WITHOUT CONTRAST TECHNIQUE: Contiguous axial images were obtained from the base of the skull through the vertex without intravenous contrast. RADIATION DOSE REDUCTION: This exam was performed according to the departmental dose-optimization program which includes automated exposure control, adjustment of the mA and/or kV according to patient size and/or use of iterative reconstruction technique. COMPARISON:  May 20, 2020 FINDINGS: Brain: No evidence of acute infarction, hemorrhage, hydrocephalus, extra-axial collection or mass lesion/mass effect. A small chronic right-sided para thalamic lacunar infarct is noted. Vascular: No hyperdense vessel or unexpected calcification. Skull: Normal. Negative for fracture or focal lesion. Sinuses/Orbits: No acute  finding. Other: None. IMPRESSION: 1. No acute intracranial abnormality. 2. Small chronic right-sided para thalamic lacunar infarct. Electronically Signed   By: Aram Candela M.D.   On: 09/17/2022 22:18    Procedures Procedures  {Document cardiac monitor, telemetry assessment procedure when appropriate:1}  Medications Ordered in ED Medications  prochlorperazine (COMPAZINE) injection 10 mg (10 mg Intravenous Given 09/17/22 2212)  diphenhydrAMINE (BENADRYL) injection 25 mg (25 mg Intravenous Given 09/17/22 2213)    ED Course/ Medical Decision Making/ A&P   {   Click here for ABCD2, HEART and other calculatorsREFRESH Note before signing :1}                          Medical Decision Making Amount and/or Complexity of Data Reviewed Labs: ordered. Radiology: ordered.  Risk Prescription drug management.   ***  {Document critical care time when appropriate:1} {Document review of labs and clinical decision tools ie heart score, Chads2Vasc2 etc:1}  {Document your independent review of radiology images, and any outside records:1} {Document your discussion with family members, caretakers, and with consultants:1} {Document social determinants of health affecting pt's care:1} {Document your decision making why or why not admission, treatments were needed:1} Final Clinical Impression(s) / ED Diagnoses Final diagnoses:  None    Rx / DC Orders ED Discharge Orders     None

## 2022-09-19 ENCOUNTER — Observation Stay (HOSPITAL_COMMUNITY): Payer: Medicare Other

## 2022-09-19 DIAGNOSIS — G43101 Migraine with aura, not intractable, with status migrainosus: Secondary | ICD-10-CM

## 2022-09-19 DIAGNOSIS — R202 Paresthesia of skin: Secondary | ICD-10-CM | POA: Diagnosis not present

## 2022-09-19 MED ORDER — ONDANSETRON HCL 4 MG/2ML IJ SOLN
4.0000 mg | Freq: Once | INTRAMUSCULAR | Status: AC
  Administered 2022-09-19: 4 mg via INTRAVENOUS
  Filled 2022-09-19: qty 2

## 2022-09-19 MED ORDER — TOPIRAMATE ER 50 MG PO SPRINKLE CAP24: 50.0000 mg | EXTENDED_RELEASE_CAPSULE | Freq: Every day | ORAL | 0 refills | Status: DC

## 2022-09-19 MED ORDER — DEXAMETHASONE SODIUM PHOSPHATE 10 MG/ML IJ SOLN
10.0000 mg | Freq: Once | INTRAMUSCULAR | Status: AC
  Administered 2022-09-19: 10 mg via INTRAVENOUS
  Filled 2022-09-19: qty 1

## 2022-09-19 MED ORDER — VALPROATE SODIUM 100 MG/ML IV SOLN
1000.0000 mg | Freq: Once | INTRAVENOUS | Status: AC
  Administered 2022-09-19: 1000 mg via INTRAVENOUS
  Filled 2022-09-19: qty 10

## 2022-09-19 MED ORDER — TOPIRAMATE ER 25 MG PO SPRINKLE CAP24: 50.0000 mg | EXTENDED_RELEASE_CAPSULE | Freq: Every day | ORAL | Status: DC

## 2022-09-19 NOTE — Discharge Summary (Signed)
Physician Discharge Summary  Joyce [name] ZOX:[id number] DOB: 1976-04-14 DOA: 09/17/2022  PCP: [name], MD  Admit date: 09/17/2022  Discharge date: 09/19/2022  Admitted From:Home  Disposition:  Home  Recommendations for Outpatient Follow-up:  Follow up with PCP in 1-2 weeks Follow-up with neurology in 4-6 weeks with referral sent Continue on Topamax 50 mg at bedtime as prescribed per neurology Continue other home medications as prior  Home Health: None, outpatient PT  Equipment/Devices: None  Discharge Condition:Stable  CODE STATUS: Full  Diet recommendation: Heart Healthy  Brief/Interim Summary:  Joyce [name] is a 47 y.o. female with medical history significant of hypertension, hyperlipidemia, prior stroke with mild left-sided residual weakness, migraine who presents to the emergency department due to headache and left arm numbness which started yesterday.  She was admitted for evaluation of skin paresthesias with concerns for atypical migraine versus TIA/ischemic stroke.  Imaging negative for findings of CVA and patient continued to have some headaches for which she was given migraine cocktail and will be discharged with Topamax at bedtime daily.  She was seen by PT with recommendations for outpatient PT noted.  2D echocardiogram with no significant findings and LDL 68 with A1c 5.5%.  Neurology to follow-up outpatient.  No other acute events or concerns noted.  Discharge Diagnoses:  Principal Problem:   Paresthesia of skin Active Problems:   Essential hypertension   History of stroke   Mixed hyperlipidemia  Principal discharge diagnosis: Headaches with paresthesias possibly due to atypical migraine.  Discharge Instructions  Discharge Instructions     Ambulatory referral to Neurology   Complete by: As directed    An appointment is requested in approximately: 4 weeks   Ambulatory referral to Physical Therapy   Complete by: As directed    Diet - low  sodium heart healthy   Complete by: As directed    Increase activity slowly   Complete by: As directed       Allergies as of 09/19/2022   No Known Allergies      Medication List     TAKE these medications    amLODipine 10 MG tablet Commonly known as: NORVASC Take 1 tablet (10 mg total) by mouth daily.   fluticasone 50 MCG/ACT nasal spray Commonly known as: FLONASE Use 2 sprays in each nostril BID for a week. After 1 week, decrease to 1 spray in each nostril BID as needed for congestion/allergies.   ibuprofen 600 MG tablet Commonly known as: ADVIL Take 1 tablet (600 mg total) by mouth every 6 (six) hours as needed.   PARAGARD INTRAUTERINE COPPER IU by Intrauterine route.   pregabalin 25 MG capsule Commonly known as: LYRICA Take 25 mg by mouth 3 (three) times daily.   rosuvastatin 10 MG tablet Commonly known as: Crestor Take 1 tablet (10 mg total) by mouth daily.   tiZANidine 4 MG tablet Commonly known as: ZANAFLEX Take 4 mg by mouth 2 (two) times daily.   topiramate ER 50 MG Cs24 sprinkle capsule Commonly known as: QUDEXY XR Take 1 capsule (50 mg total) by mouth at bedtime.   traZODone 100 MG tablet Commonly known as: DESYREL Take 100 mg by mouth at bedtime.        Follow-up Information     [facility] [place]. Schedule an appointment as soon as possible for a visit.   Specialty: Rehabilitation Why: They will call to schedule your first appointment for PT Contact information: [address] [id number] [place]  [place] [address] [phone]        [name], MD. Schedule an appointment as soon as possible for a visit in 1 week(s).   Specialty: Internal Medicine Contact information: [address] [place] [address] [phone]         Ocie Doyne, MD. Go in 4 week(s).   Specialty: Neurology Contact information: [address], [address] [place]  [address] [phone]                No Known Allergies  Consultations: Neurology   Procedures/Studies: MR BRAIN WO CONTRAST  Result Date: 09/19/2022 CLINICAL DATA:  Neuro deficit, acute, stroke suspected. Left hand numbness. EXAM: MRI HEAD WITHOUT CONTRAST TECHNIQUE: Multiplanar, multiecho pulse sequences of the brain and surrounding structures were obtained without intravenous contrast. COMPARISON:  Head CT 09/17/2022.  MRI 05/20/2020. FINDINGS: Brain: Diffusion imaging does not show any acute or subacute infarction. No abnormality affects the brainstem or cerebellum. There is an old lacunar infarction in the lateral right thalamus. No other old small or large vessel infarction is seen affecting the brain. No mass, hemorrhage, hydrocephalus or extra-axial collection. Vascular: Major vessels at the base of the brain show flow. Skull and upper cervical spine: Negative Sinuses/Orbits: Clear/normal Other: None IMPRESSION: No acute, subacute or reversible finding. Old lacunar infarction of the lateral right thalamus. Otherwise normal examination. Electronically Signed   By: Paulina Fusi M.D.   On: 09/19/2022 10:18   ECHOCARDIOGRAM COMPLETE  Result Date: 09/18/2022    ECHOCARDIOGRAM REPORT   Patient Name:   Joyce [name] Date of Exam: 09/18/2022 Medical Rec #:  [id number]          Height:       65.0 in Accession #:    [id number]         Weight:       230.2 lb Date of Birth:  1975/08/26          BSA:          2.100 m Patient Age:    46 years           BP:           123/63 mmHg Patient Gender: F                  HR:           52 bpm. [facility] Procedure: 2D Echo, Cardiac Doppler and Color Doppler Indications:    Stroke  History:        Patient has prior history of Echocardiogram examinations, most                 recent 03/11/2020. Stroke; Risk Factors:Hypertension and                 Dyslipidemia.  Sonographer:    Sheralyn Boatman RDCS Referring Phys: [id number] [facility] ADEFESO   Sonographer Comments: Image acquisition challenging due to patient body habitus. IMPRESSIONS  1. Left ventricular ejection fraction, by estimation, is 55 to 60%. The left ventricle has normal function. The left ventricle has no regional wall motion abnormalities. There is mild concentric left ventricular hypertrophy. Left ventricular diastolic parameters were normal.  2. Right ventricular systolic function is normal. The right ventricular size is normal. Tricuspid regurgitation signal is inadequate for assessing PA pressure.  3. The mitral valve is normal in structure. Trivial mitral valve regurgitation. No evidence of mitral stenosis.  4. The aortic valve is tricuspid.  Aortic valve regurgitation is not visualized. No aortic stenosis is present.  5. The inferior vena cava is normal in size with greater than 50% respiratory variability, suggesting right atrial pressure of 3 mmHg. Comparison(s): No significant change from prior study. FINDINGS  Left Ventricle: Left ventricular ejection fraction, by estimation, is 55 to 60%. The left ventricle has normal function. The left ventricle has no regional wall motion abnormalities. The left ventricular internal cavity size was normal in size. There is  mild concentric left ventricular hypertrophy. Left ventricular diastolic parameters were normal. Right Ventricle: The right ventricular size is normal. No increase in right ventricular wall thickness. Right ventricular systolic function is normal. Tricuspid regurgitation signal is inadequate for assessing PA pressure. Left Atrium: Left atrial size was normal in size. Right Atrium: Right atrial size was normal in size. Pericardium: There is no evidence of pericardial effusion. Mitral Valve: The mitral valve is normal in structure. Trivial mitral valve regurgitation. No evidence of mitral valve stenosis. Tricuspid Valve: The tricuspid valve is normal in structure. Tricuspid valve regurgitation is mild . No evidence of tricuspid  stenosis. Aortic Valve: The aortic valve is tricuspid. Aortic valve regurgitation is not visualized. No aortic stenosis is present. Pulmonic Valve: The pulmonic valve was grossly normal. Pulmonic valve regurgitation is not visualized. No evidence of pulmonic stenosis. Aorta: The aortic root, ascending aorta and aortic arch are all structurally normal, with no evidence of dilitation or obstruction and the ascending aorta was not well visualized. Venous: The inferior vena cava is normal in size with greater than 50% respiratory variability, suggesting right atrial pressure of 3 mmHg. IAS/Shunts: No atrial level shunt detected by color flow Doppler.  LEFT VENTRICLE PLAX 2D LVIDd:         4.20 cm   Diastology LVIDs:         3.00 cm   LV e' medial:    9.03 cm/s LV PW:         1.50 cm   LV E/e' medial:  11.5 LV IVS:        0.90 cm   LV e' lateral:   10.10 cm/s LVOT diam:     2.20 cm   LV E/e' lateral: 10.3 LV SV:         95 LV SV Index:   45 LVOT Area:     3.80 cm  RIGHT VENTRICLE             IVC RV S prime:     12.20 cm/s  IVC diam: 2.00 cm TAPSE (M-mode): 1.9 cm LEFT ATRIUM             Index        RIGHT ATRIUM           Index LA diam:        3.00 cm 1.43 cm/m   RA Area:     13.10 cm LA Vol (A2C):   37.7 ml 17.95 ml/m  RA Volume:   30.20 ml  14.38 ml/m LA Vol (A4C):   44.0 ml 20.95 ml/m LA Biplane Vol: 44.2 ml 21.05 ml/m  AORTIC VALVE LVOT Vmax:   117.00 cm/s LVOT Vmean:  74.900 cm/s LVOT VTI:    0.251 m  AORTA Ao Root diam: 2.80 cm Ao Asc diam:  3.30 cm MITRAL VALVE                TRICUSPID VALVE MV Area (PHT): 4.21 cm     TR Peak grad:   14.7  mmHg MV Decel Time: 180 msec     TR Vmax:        192.00 cm/s MV E velocity: 104.00 cm/s MV A velocity: 41.00 cm/s   SHUNTS MV E/A ratio:  2.54         Systemic VTI:  0.25 m                             Systemic Diam: 2.20 cm Riley Lam MD Electronically signed by Riley Lam MD Signature Date/Time: 09/18/2022/12:30:21 PM    Final    CT HEAD WO  CONTRAST  Result Date: 09/17/2022 CLINICAL DATA:  Headache and left hand numbness. EXAM: CT HEAD WITHOUT CONTRAST TECHNIQUE: Contiguous axial images were obtained from the base of the skull through the vertex without intravenous contrast. RADIATION DOSE REDUCTION: This exam was performed according to the departmental dose-optimization program which includes automated exposure control, adjustment of the mA and/or kV according to patient size and/or use of iterative reconstruction technique. COMPARISON:  May 20, 2020 FINDINGS: Brain: No evidence of acute infarction, hemorrhage, hydrocephalus, extra-axial collection or mass lesion/mass effect. A small chronic right-sided para thalamic lacunar infarct is noted. Vascular: No hyperdense vessel or unexpected calcification. Skull: Normal. Negative for fracture or focal lesion. Sinuses/Orbits: No acute finding. Other: None. IMPRESSION: 1. No acute intracranial abnormality. 2. Small chronic right-sided para thalamic lacunar infarct. Electronically Signed   By: Aram Candela M.D.   On: 09/17/2022 22:18     Discharge Exam: Vitals:   09/18/22 1838 09/19/22 0526  BP: (!) 146/77 126/68  Pulse: 84 63  Resp: 18 20  Temp: 98.9 F (37.2 C) 98.2 F (36.8 C)  SpO2: 100% 100%   Vitals:   09/18/22 0901 09/18/22 1514 09/18/22 1838 09/19/22 0526  BP: 123/63 131/69 (!) 146/77 126/68  Pulse: 64 83 84 63  Resp: 18 20 18 20   Temp: 98.5 F (36.9 C) 98.7 F (37.1 C) 98.9 F (37.2 C) 98.2 F (36.8 C)  TempSrc: Oral Oral Oral Oral  SpO2: 100% 100% 100% 100%  Weight: 104.4 kg     Height: 5\' 5"  (1.651 m)       General: Pt is alert, awake, not in acute distress Cardiovascular: RRR, S1/S2 +, no rubs, no gallops Respiratory: CTA bilaterally, no wheezing, no rhonchi Abdominal: Soft, NT, ND, bowel sounds + Extremities: no edema, no cyanosis    The results of significant diagnostics from this hospitalization (including imaging, microbiology, ancillary and  laboratory) are listed below for reference.     Microbiology: No results found for this or any previous visit (from the past 240 hour(s)).   Labs: BNP (last 3 results) No results for input(s): "BNP" in the last 8760 hours. Basic Metabolic Panel: Recent Labs  Lab 09/17/22 2150 09/18/22 0702  NA 134* 135  K 3.6 3.5  CL 102 106  CO2 23 22  GLUCOSE 90 84  BUN 12 8  CREATININE 0.71 0.67  CALCIUM 9.1 8.8*  MG  --  1.7  PHOS  --  3.7   Liver Function Tests: Recent Labs  Lab 09/17/22 2150 09/18/22 0702  AST 19 18  ALT 18 15  ALKPHOS 40 35*  BILITOT 0.4 0.7  PROT 7.8 7.0  ALBUMIN 3.8 3.4*   No results for input(s): "LIPASE", "AMYLASE" in the last 168 hours. No results for input(s): "AMMONIA" in the last 168 hours. CBC: Recent Labs  Lab 09/17/22 2150 09/18/22 0702  WBC 10.6*  7.8  NEUTROABS 6.4  --   HGB 10.5* 10.3*  HCT 34.4* 33.8*  MCV 69.5* 69.5*  PLT 350 315   Cardiac Enzymes: No results for input(s): "CKTOTAL", "CKMB", "CKMBINDEX", "TROPONINI" in the last 168 hours. BNP: Invalid input(s): "POCBNP" CBG: Recent Labs  Lab 09/17/22 2145  GLUCAP 93   D-Dimer No results for input(s): "DDIMER" in the last 72 hours. Hgb A1c Recent Labs    09/18/22 0702  HGBA1C 5.5   Lipid Profile Recent Labs    09/18/22 0702  CHOL 119  HDL 37*  LDLCALC 68  TRIG 68  CHOLHDL 3.2   Thyroid function studies No results for input(s): "TSH", "T4TOTAL", "T3FREE", "THYROIDAB" in the last 72 hours.  Invalid input(s): "FREET3" Anemia work up No results for input(s): "VITAMINB12", "FOLATE", "FERRITIN", "TIBC", "IRON", "RETICCTPCT" in the last 72 hours. Urinalysis    Component Value Date/Time   COLORURINE YELLOW 09/17/2022 2146   APPEARANCEUR HAZY (A) 09/17/2022 2146   APPEARANCEUR Cloudy (A) 01/27/2016 1625   LABSPEC 1.024 09/17/2022 2146   PHURINE 5.0 09/17/2022 2146   GLUCOSEU NEGATIVE 09/17/2022 2146   HGBUR MODERATE (A) 09/17/2022 2146   BILIRUBINUR NEGATIVE  09/17/2022 2146   BILIRUBINUR Negative 01/27/2016 1625   KETONESUR NEGATIVE 09/17/2022 2146   PROTEINUR NEGATIVE 09/17/2022 2146   UROBILINOGEN 0.2 04/07/2015 0005   NITRITE NEGATIVE 09/17/2022 2146   LEUKOCYTESUR SMALL (A) 09/17/2022 2146   Sepsis Labs Recent Labs  Lab 09/17/22 2150 09/18/22 0702  WBC 10.6* 7.8   Microbiology No results found for this or any previous visit (from the past 240 hour(s)).   Time coordinating discharge: 35 minutes  SIGNED:   Erick Blinks, DO [facility] 09/19/2022, 11:48 AM  If 7PM-7AM, please contact night-coverage [email or web address]

## 2022-09-19 NOTE — TOC Transition Note (Signed)
Transition of Care [facility]) - CM/SW Discharge Note   Patient Details  Name: Joyce [name] MRN: [id number] Date of Birth: 1976/03/02  Transition of Care [facility]) CM/SW Contact:  Leitha Bleak, RN Phone Number: 09/19/2022, 10:49 AM   Clinical Narrative:   Patient admitted with Paresthesia. PT is recommending outpatient PT. Patient maybe discharging later today. Orders placed for Outpatient PT on [facility] Added to AVS. Patient will follow up for an appointment time.    Final next level of care: Home/Self Care Barriers to Discharge: Barriers Resolved   Patient Goals and CMS Choice CMS Medicare.gov Compare Post Acute Care list provided to:: Patient    Discharge Placement      Patient and family notified of of transfer: 09/19/22  Discharge Plan and Services Additional resources added to the After Visit Summary for         Social Determinants of Health (SDOH) Interventions SDOH Screenings   Food Insecurity: No Food Insecurity (05/11/2022)  Housing: Low Risk  (05/11/2022)  Transportation Needs: No Transportation Needs (05/11/2022)  Utilities: Not At Risk (05/11/2022)  Depression (PHQ2-9): High Risk (08/15/2022)  Financial Resource Strain: Low Risk  (05/11/2022)  Tobacco Use: Low Risk  (09/14/2022)    Readmission Risk Interventions     No data to display

## 2022-09-19 NOTE — Consult Note (Addendum)
I connected with  Joyce [name] on 09/19/22 by a video enabled telemedicine application and verified that I am speaking with the correct person using two identifiers.   I discussed the limitations of evaluation and management by telemedicine. The patient expressed understanding and agreed to proceed.  Location of patient: [facility] Location of physician: [facility]   Neurology Consultation Reason for Consult: Headache, left-sided paresthesias Referring Physician: Dr. Mauro Kaufmann  CC: Headache, left-sided paresthesias  History is obtained from: Patient, chart review  HPI: Joyce [name] is a 47 y.o. 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  Patient states she started noticing headache on Saturday 09/17/2022 at around noon.  At around 2100, she noticed paresthesias in left upper extremity.  Because of her history of stroke, she decided to come to the ER.  She was evaluated by telestroke, tPA was not administered due to nondisabling symptoms.  MRI brain was performed today which did not show any acute ischemic stroke.  Patient states she continues to have 6 out of 10, throbbing holocephalic headache not associated with nausea/vomiting, photophobia/phonophobia.  Still has decreased sensation to light touch in left upper extremity.  Patient used to be on Topamax for headache but has been off for quite some time because her headaches had improved.  However states her headaches have gradually been getting worse.  Denies any side effects while she was on Topamax in the past.  ROS: All other systems reviewed and negative except as noted in the HPI.    Past Medical History:  Diagnosis Date   Acute CVA (cerebrovascular accident) 03/04/2020   Acute left-sided weakness 03/05/2020   Anemia    Depression    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   Hypertension     Lumbar radiculitis 06/23/2021   Migraine    PTSD (post-traumatic stress disorder) 05/01/2017   Round ligament pain 11/12/2014     Family History  Problem Relation Age of Onset   Diabetes Mother    Hypertension Mother    Kidney disease Mother    Asthma Mother    Hyperlipidemia Mother    Alzheimer's disease Father    Diabetes Father    Kidney disease Sister    Asthma Son    Diabetes Sister    Asthma Son    ADD / ADHD Son    Heart murmur Son    Asthma Son    Colon cancer Neg Hx    Colon polyps Neg Hx     Social History:  reports that she has never smoked. She has never used smokeless tobacco. She reports that she does not drink alcohol and does not use drugs.     Medications Prior to Admission  Medication Sig Dispense Refill Last Dose   amLODipine (NORVASC) 10 MG tablet Take 1 tablet (10 mg total) by mouth daily. 90 tablet 3 Past Week   fluticasone (FLONASE) 50 MCG/ACT nasal spray Use 2 sprays in each nostril BID for a week. After 1 week, decrease to 1 spray in each nostril BID as needed for congestion/allergies. 16 g 6 unknown   ibuprofen (ADVIL) 600 MG tablet Take 1 tablet (600 mg total) by mouth every 6 (six) hours as needed. 30 tablet 0 unknown   PARAGARD INTRAUTERINE COPPER IU by Intrauterine route.   unknown   pregabalin (LYRICA) 25 MG capsule Take 25 mg by mouth 3 (three) times daily.  Past Week   rosuvastatin (CRESTOR) 10 MG tablet Take 1 tablet (10 mg total) by mouth daily. 90 tablet 3 Past Week   tiZANidine (ZANAFLEX) 4 MG tablet Take 4 mg by mouth 2 (two) times daily.   Past Week   traZODone (DESYREL) 100 MG tablet Take 100 mg by mouth at bedtime.   Past Week      Exam: Current vital signs: BP 126/68 (BP Location: Left Arm)   Pulse 63   Temp 98.2 F (36.8 C) (Oral)   Resp 20   Ht  (1.651 m)   Wt 104.4 kg   SpO2 100%   BMI 38.30 kg/m  Vital signs in last 24 hours: Temp:  [98.2 F (36.8 C)-98.9 F (37.2 C)] 98.2 F (36.8 C) (04/22  0526) Pulse Rate:  [63-84] 63 (04/22 0526) Resp:  [18-20] 20 (04/22 0526) BP: (126-146)/(68-77) 126/68 (04/22 0526) SpO2:  [100 %] 100 % (04/22 0526)   Physical Exam  Constitutional: Appears well-developed and well-nourished.  Psych: Affect appropriate to situation Neuro: AOx3, no aphasia, cranial nerves II to XII appear grossly intact, antigravity strength without drift in all 4 extremities, decreased sensation to touch in left upper extremity, FTN intact bilaterally  I have reviewed labs in epic and the results pertinent to this consultation are: CBC:  Recent Labs  Lab 09/17/22 2150 09/18/22 0702  WBC 10.6* 7.8  NEUTROABS 6.4  --   HGB 10.5* 10.3*  HCT 34.4* 33.8*  MCV 69.5* 69.5*  PLT 350 315    Basic Metabolic Panel:  Lab Results  Component Value Date   NA 135 09/18/2022   K 3.5 09/18/2022   CO2 22 09/18/2022   GLUCOSE 84 09/18/2022   BUN 8 09/18/2022   CREATININE 0.67 09/18/2022   CALCIUM 8.8 (L) 09/18/2022   GFRNONAA >60 09/18/2022   GFRAA 108 06/18/2020   Lipid Panel:  Lab Results  Component Value Date   LDLCALC 68 09/18/2022   HgbA1c:  Lab Results  Component Value Date   HGBA1C 5.5 09/18/2022   Urine Drug Screen:     Component Value Date/Time   COCAINSCRNUR Negative 01/27/2016 1625   LABBENZ Negative 01/27/2016 1625   AMPHETMU Negative 01/27/2016 1625    Alcohol Level     Component Value Date/Time   ETH <10 09/17/2022 2150     I have reviewed the images obtained:  MRI Brain without contrast 09/19/2022: No acute abnormality.  Chronic right thalamic infarct.   ASSESSMENT/PLAN: 47 year old female with chronic right thalamic infarct who presented with headache, left-sided paresthesias.  Headache with left-sided paresthesias -Differentials include migraine with aura versus MRI negative stroke versus less likely cervical stenosis -Will give migraine cocktail including IV Depakote 1000 mg once, dexamethasone 10 mg once and Zofran 4 mg  once -Recommend starting Topamax extended release 50 mg nightly -Follow-up with Dr. Delena Bali at [facility] neurology in 4 to 6 weeks -If symptoms do not improve, can consider MRI C-spine as an outpatient -Discussed plan with patient as well as Dr. Sherryll Burger via secure chat  Thank you for allowing [place] to participate in the care of this patient. If you have any further questions, please contact  me or neurohospitalist.   Lindie Spruce [facility] neurohospitalist

## 2022-09-19 NOTE — Care Management Obs Status (Signed)
MEDICARE OBSERVATION STATUS NOTIFICATION   Patient Details  Name: Joyce [name] MRN: [id number] Date of Birth: 1976/04/14   Medicare Observation Status Notification Given:  Yes    Corey Harold 09/19/2022, 10:27 AM

## 2022-10-11 ENCOUNTER — Ambulatory Visit (HOSPITAL_COMMUNITY): Payer: Medicare Other | Admitting: Physical Therapy

## 2022-10-19 ENCOUNTER — Telehealth: Payer: Self-pay

## 2022-10-19 MED ORDER — PREGABALIN 25 MG PO CAPS: 25.0000 mg | ORAL_CAPSULE | Freq: Three times a day (TID) | ORAL | 3 refills | Status: DC

## 2022-10-19 NOTE — Telephone Encounter (Signed)
Rx signed and sent to the pharmacy. Thanks!

## 2022-10-19 NOTE — Telephone Encounter (Signed)
Patient requesting refill on pregabalin

## 2022-12-14 ENCOUNTER — Encounter: Payer: Medicare Other | Attending: Physical Medicine & Rehabilitation | Admitting: Physical Medicine & Rehabilitation

## 2022-12-14 ENCOUNTER — Encounter: Payer: Self-pay | Admitting: Physical Medicine & Rehabilitation

## 2022-12-14 ENCOUNTER — Ambulatory Visit: Payer: Medicare Other | Admitting: Internal Medicine

## 2022-12-14 VITALS — BP 138/83 | HR 77 | Ht 65.0 in | Wt 218.2 lb

## 2022-12-14 DIAGNOSIS — G479 Sleep disorder, unspecified: Secondary | ICD-10-CM | POA: Diagnosis present

## 2022-12-14 DIAGNOSIS — M1711 Unilateral primary osteoarthritis, right knee: Secondary | ICD-10-CM | POA: Insufficient documentation

## 2022-12-14 DIAGNOSIS — M792 Neuralgia and neuritis, unspecified: Secondary | ICD-10-CM | POA: Insufficient documentation

## 2022-12-14 DIAGNOSIS — M5416 Radiculopathy, lumbar region: Secondary | ICD-10-CM | POA: Insufficient documentation

## 2022-12-14 MED ORDER — TRAZODONE HCL 100 MG PO TABS: 100.0000 mg | ORAL_TABLET | Freq: Every day | ORAL | 5 refills | Status: DC

## 2022-12-14 MED ORDER — PREGABALIN 50 MG PO CAPS: 50.0000 mg | ORAL_CAPSULE | Freq: Three times a day (TID) | ORAL | 6 refills | Status: DC

## 2022-12-14 NOTE — Progress Notes (Signed)
Subjective:    Patient ID: [name], female    DOB: 08-Feb-1976, 47 y.o.   MRN: [id number]  HPI  Joyce [name] is here in follow up of her thalamic infarct and back pain. She's had good luck with Voltaren gel as well as the strengthening exercises she is doing.  Her pain is manageable where she is able to move and get around a lot easier.  Her main complaint is her back and right buttock pain which seems to bother her more when she tries to go to bed at nighttime.  I asked her if she is stretching at bedtime and she really is not.  Pain is usually in her upper buttock and low back when she first lies flat in bed.  She is sleeping fairly well with trazodone when she gets to rest.  She takes 100 mg at bedtime.  The pregabalin has helped with some of her nerve pain and she is taking 25 mg 3 times daily currently.  She also uses ibuprofen as needed.  Her mood is generally positive although she does have her moments when she feels a bit down.  Pain Inventory Average Pain 6 Pain Right Now 6 My pain is burning and tingling  In the last 24 hours, has pain interfered with the following? General activity 8 Relation with others 6 Enjoyment of life 8 What TIME of day is your pain at its worst? night Sleep (in general) NA  Pain is worse with: walking, standing, and some activites Pain improves with:  na Relief from Meds: 2  Family History  Problem Relation Age of Onset   Diabetes Mother    Hypertension Mother    Kidney disease Mother    Asthma Mother    Hyperlipidemia Mother    Alzheimer's disease Father    Diabetes Father    Kidney disease Sister    Asthma Son    Diabetes Sister    Asthma Son    ADD / ADHD Son    Heart murmur Son    Asthma Son    Colon cancer Neg Hx    Colon polyps Neg Hx    Social History   Socioeconomic History   Marital status: Significant Other    Spouse name: Hotel manager Love   Number of children: 8   Years of education: 9   Highest education level:  Not on file  Occupational History   Occupation: unemployed    Comment: disabled  Tobacco Use   Smoking status: Never   Smokeless tobacco: Never  Vaping Use   Vaping status: Never Used  Substance and Sexual Activity   Alcohol use: Never   Drug use: No   Sexual activity: Yes    Birth control/protection: I.U.D.    Comment: followed by GYN  Other Topics Concern   Not on file  Social History Narrative   Lives with fiance and kids   Right Handed   Drinks >12 cans of soda in caffeine   Social Determinants of Health   Financial Resource Strain: Low Risk  (05/11/2022)   Overall Financial Resource Strain (CARDIA)    Difficulty of Paying Living Expenses: Not hard at all  Food Insecurity: No Food Insecurity (05/11/2022)   Hunger Vital Sign    Worried About Running Out of Food in the Last Year: Never true    Ran Out of Food in the Last Year: Never true  Transportation Needs: No Transportation Needs (05/11/2022)   PRAPARE - Transportation  Lack of Transportation (Medical): No    Lack of Transportation (Non-Medical): No  Physical Activity: Not on file  Stress: Not on file  Social Connections: Not on file   Past Surgical History:  Procedure Laterality Date   CHOLECYSTECTOMY     COLONOSCOPY WITH PROPOFOL N/A 02/09/2022   Procedure: COLONOSCOPY WITH PROPOFOL;  Surgeon: Toney Reil, MD;  Location: [facility] ENDOSCOPY;  Service: Gastroenterology;  Laterality: N/A;   Past Surgical History:  Procedure Laterality Date   CHOLECYSTECTOMY     COLONOSCOPY WITH PROPOFOL N/A 02/09/2022   Procedure: COLONOSCOPY WITH PROPOFOL;  Surgeon: Toney Reil, MD;  Location: [facility] ENDOSCOPY;  Service: Gastroenterology;  Laterality: N/A;   Past Medical History:  Diagnosis Date   Acute CVA (cerebrovascular accident) (HCC) 03/04/2020   Acute left-sided weakness 03/05/2020   Anemia    Depression    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   Hypertension    Lumbar radiculitis 06/23/2021   Migraine     PTSD (post-traumatic stress disorder) 05/01/2017   Round ligament pain 11/12/2014   BP 138/83   Pulse 77   Ht 5\' 5"  (1.651 m)   Wt 218 lb 3.2 oz (99 kg)   SpO2 98%   BMI 36.31 kg/m   Opioid Risk Score:   Fall Risk Score:  `1  Depression screen [facility] 2/9     12/14/2022    2:55 PM 08/15/2022    4:17 PM 05/11/2022    3:25 PM 01/11/2022    3:05 PM 01/05/2022    3:06 PM 10/13/2021    2:06 PM 10/06/2021    2:39 PM  Depression screen PHQ 2/9  Decreased Interest 1 1 2 3  0 2 3  Down, Depressed, Hopeless 1 2 3 3  0 2 3  PHQ - 2 Score 2 3 5 6  0 4 6  Altered sleeping  3 3 3   3   Tired, decreased energy  2 3 3   2   Change in appetite  0 0 0   0  Feeling bad or failure about yourself   0 2 1   2   Trouble concentrating  3 3 3   3   Moving slowly or fidgety/restless  0 1 0   2  Suicidal thoughts  0 0 0   0  PHQ-9 Score  11 17 16   18   Difficult doing work/chores  Very difficult Somewhat difficult Very difficult   Very difficult     Review of Systems  Constitutional: Negative.   HENT: Negative.    Eyes: Negative.   Respiratory: Negative.    Cardiovascular: Negative.   Endocrine: Negative.   Genitourinary: Negative.   Musculoskeletal:  Positive for back pain.       Hands and left foot  Skin: Negative.   Allergic/Immunologic: Negative.   Neurological: Negative.   Hematological: Negative.   Psychiatric/Behavioral: Negative.    All other systems reviewed and are negative.      Objective:   Physical Exam  General: No acute distress HEENT: NCAT, EOMI, oral membranes moist Cards: reg rate  Chest: normal effort Abdomen: Soft, NT, ND Skin: dry, intact Extremities: no edema Psych: pleasant and appropriate   Neuro Alert and oriented x 3. Normal insight and awareness. Intact Memory. Normal language and speech. Cranial nerve exam unremarkable except for left facial sensory loss.  Sensory 1+/2 LUE and LLE remains present Motor 4+/5 on left 5/5 on right.  weight shift is good, improved.   Musculoskeletal: Patient with mild low back pain worse  with extension.  Weight less antalgic.       Assessment & Plan:  1.  Left-sided hemiparesis secondary to right thalamic infarction             -Secondary stroke prophylaxis with  aspirin 325 mg daily             -She has made improvements with her gait mechanics             2.  Pain management/history of migraines               -lyrica for headaches and pain.--increase to 50mg  tid after titration  3.  Right knee pain: improved             -Diclofenac gel             -supps for OA Dhave helped             -knee strengthening exercises             -ice             4.  Spasticity: improved. 5.  Hypertension: Increase Norvasc to 10 mg daily 6. Low back pain-patient has spondylosis and facet arthropathy on her most recent x-ray from last year.  MRI really unremarkable.             -related to gait mechanics.  -tizanidine ongoing   -night time stretches recommended 7.  Left elbow pain consistent with lateral epicondylitis             --improved.    20 minutes of face to face patient care time were spent during this visit. All questions were encouraged and answered.  Follow up with me in 6 mos .

## 2022-12-14 NOTE — Patient Instructions (Addendum)
BEFORE YOU GO TO  BED, STRETCH OUT YOUR BUT MUSCLES AND HAMSTRINGS. TRY TO BEND FORWARD AND STRETCH YOUR BACK TOO.   PUT A PILLOW UNDER YOUR FEET.    TAKE PREGABALIN 50MG  TWICE DAILY FOR ONE WEEK THEN INCREASE TO THREE X DAILY

## 2022-12-15 ENCOUNTER — Telehealth: Payer: Self-pay | Admitting: *Deleted

## 2022-12-15 DIAGNOSIS — M5416 Radiculopathy, lumbar region: Secondary | ICD-10-CM

## 2022-12-15 DIAGNOSIS — M792 Neuralgia and neuritis, unspecified: Secondary | ICD-10-CM

## 2022-12-15 DIAGNOSIS — G479 Sleep disorder, unspecified: Secondary | ICD-10-CM

## 2022-12-15 NOTE — Telephone Encounter (Signed)
There was issue with refill request sent yesterday. Please re-submit

## 2022-12-16 MED ORDER — PREGABALIN 50 MG PO CAPS: 50.0000 mg | ORAL_CAPSULE | Freq: Three times a day (TID) | ORAL | 6 refills | Status: DC

## 2022-12-16 MED ORDER — TRAZODONE HCL 100 MG PO TABS: 100.0000 mg | ORAL_TABLET | Freq: Every day | ORAL | 5 refills | Status: DC

## 2022-12-16 NOTE — Addendum Note (Signed)
Addended by: Faith Rogue T on: 12/16/2022 01:13 PM   Modules accepted: Orders

## 2022-12-16 NOTE — Telephone Encounter (Signed)
I filled meds on Wednesday at her office visit. Trazodone and lyrica.  I re-sent these today. There were no meds sent on 7/18.

## 2023-01-03 ENCOUNTER — Telehealth: Payer: Self-pay | Admitting: Neurology

## 2023-01-03 ENCOUNTER — Encounter: Payer: Self-pay | Admitting: Neurology

## 2023-01-03 ENCOUNTER — Ambulatory Visit (INDEPENDENT_AMBULATORY_CARE_PROVIDER_SITE_OTHER): Payer: Medicare Other | Admitting: Neurology

## 2023-01-03 VITALS — BP 121/71 | HR 85 | Ht 65.0 in | Wt 214.4 lb

## 2023-01-03 DIAGNOSIS — G44221 Chronic tension-type headache, intractable: Secondary | ICD-10-CM | POA: Diagnosis not present

## 2023-01-03 DIAGNOSIS — M5416 Radiculopathy, lumbar region: Secondary | ICD-10-CM | POA: Diagnosis not present

## 2023-01-03 DIAGNOSIS — Z8673 Personal history of transient ischemic attack (TIA), and cerebral infarction without residual deficits: Secondary | ICD-10-CM | POA: Diagnosis not present

## 2023-01-03 DIAGNOSIS — M5431 Sciatica, right side: Secondary | ICD-10-CM | POA: Diagnosis not present

## 2023-01-03 DIAGNOSIS — M792 Neuralgia and neuritis, unspecified: Secondary | ICD-10-CM

## 2023-01-03 MED ORDER — TOPIRAMATE ER 50 MG PO SPRINKLE CAP24: 100.0000 mg | EXTENDED_RELEASE_CAPSULE | Freq: Every day | ORAL | 0 refills | Status: DC

## 2023-01-03 MED ORDER — PREGABALIN 100 MG PO CAPS
100.0000 mg | ORAL_CAPSULE | Freq: Three times a day (TID) | ORAL | 3 refills | Status: DC
Start: 2023-01-03 — End: 2023-03-29

## 2023-01-03 NOTE — Telephone Encounter (Signed)
medicare/medicaid NPR sent to GI [phone] 

## 2023-01-03 NOTE — Patient Instructions (Addendum)
I had a long d/w patient about her remote thalamic lacunar stroke, risk for recurrent stroke/TIAs, personally independently reviewed imaging studies and stroke evaluation results and answered questions.Continue aspirin 81 mg daily  for secondary stroke prevention and maintain strict control of hypertension with blood pressure goal below 130/90, diabetes with hemoglobin A1c goal below 6.5% and lipids with LDL cholesterol goal below 70 mg/dL. I also advised the patient to eat a healthy diet with plenty of whole grains, cereals, fruits and vegetables, exercise regularly and maintain ideal body weight.  I recommend she increase her  Qudexy dose to 100 mg at night to help with her chronic headaches as well as Shadrick pain.  Increase dose of Lyrica to 100 mg 3 times daily to help with sciatica.  Check MRI scan of the lumbar spine.  Check screening carotid ultrasound.  Followup in the future with my nurse practitioner Shanda Bumps in 3 months or call earlier if necessary.  Sciatica  Sciatica is pain, weakness, tingling, or loss of feeling (numbness) along the sciatic nerve. The sciatic nerve starts in the lower back and goes down the back of each leg. Sciatica usually affects one side of the body. Sciatica usually goes away on its own or with treatment. Sometimes, sciatica may come back. What are the causes? This condition happens when the sciatic nerve is pinched or has pressure put on it. This may be caused by: A disk in between the bones of the spine bulging out too far (herniated disk). Changes in the spinal disks due to aging. A condition that affects a muscle in the butt. Extra bone growth near the sciatic nerve. A break (fracture) of the area between your hip bones (pelvis). Pregnancy. Tumor. This is rare. What increases the risk? You are more likely to develop this condition if you: Play sports that put pressure or stress on the spine. Have poor strength and ease of movement (flexibility). Have had a  back injury or back surgery. Sit for long periods of time. Do activities that involve bending or lifting over and over again. Are very overweight (obese). What are the signs or symptoms? Symptoms can vary from mild to very bad. They may include: Any of these problems in the lower back, leg, hip, or butt: Mild tingling, loss of feeling, or dull aches. A burning feeling. Sharp pains. Loss of feeling in the back of the calf or the sole of the foot. Leg weakness. Very bad back pain that makes it hard to move. These symptoms may get worse when you cough, sneeze, or laugh. They may also get worse when you sit or stand for long periods of time. How is this treated? This condition often gets better without any treatment. However, treatment may include: Changing or cutting back on physical activity when you have pain. Exercising, including strengthening and stretching. Putting ice or heat on the affected area. Shots of medicines to relieve pain and swelling or to relax your muscles. Surgery. Follow these instructions at home: Medicines Take over-the-counter and prescription medicines only as told by your doctor. Ask your doctor if you should avoid driving or using machines while you are taking your medicine. Managing pain     If told, put ice on the affected area. To do this: Put ice in a plastic bag. Place a towel between your skin and the bag. Leave the ice on for 20 minutes, 2-3 times a day. If your skin turns bright red, take off the ice right away to prevent skin  damage. The risk of skin damage is higher if you cannot feel pain, heat, or cold. If told, put heat on the affected area. Do this as often as told by your doctor. Use the heat source that your doctor tells you to use, such as a moist heat pack or a heating pad. Place a towel between your skin and the heat source. Leave the heat on for 20-30 minutes. If your skin turns bright red, take off the heat right away to prevent  burns. The risk of burns is higher if you cannot feel pain, heat, or cold. Activity  Return to your normal activities when your doctor says that it is safe. Avoid activities that make your symptoms worse. Take short rests during the day. When you rest for a long time, do some physical activity or stretching between periods of rest. Avoid sitting for a long time without moving. Get up and move around at least one time each hour. Do exercises and stretches as told by your doctor. Do not lift anything that is heavier than 10 lb (4.5 kg). Avoid lifting heavy things even when you do not have symptoms. Avoid lifting heavy things over and over. When you lift objects, always lift in a way that is safe for your body. To do this, you should: Bend your knees. Keep the object close to your body. Avoid twisting. General instructions Stay at a healthy weight. Wear comfortable shoes that support your feet. Avoid wearing high heels. Avoid sleeping on a mattress that is too soft or too hard. You might have less pain if you sleep on a mattress that is firm enough to support your back. Contact a doctor if: Your pain is not controlled by medicine. Your pain does not get better. Your pain gets worse. Your pain lasts longer than 4 weeks. You lose weight without trying. Get help right away if: You cannot control when you pee (urinate) or poop (have a bowel movement). You have weakness in any of these areas and it gets worse: Lower back. The area between your hip bones. Butt. Legs. You have redness or swelling of your back. You have a burning feeling when you pee. Summary Sciatica is pain, weakness, tingling, or loss of feeling (numbness) along the sciatic nerve. This may include the lower back, legs, hips, and butt. This condition happens when the sciatic nerve is pinched or has pressure put on it. Treatment often includes rest, exercise, medicines, and putting ice or heat on the affected area. This  information is not intended to replace advice given to you by your health care provider. Make sure you discuss any questions you have with your health care provider. Document Revised: 08/23/2021 Document Reviewed: 08/23/2021 Elsevier Patient Education  2024 [employer].

## 2023-01-03 NOTE — Progress Notes (Signed)
[facility] [address] [place]. [place] [address] [phone]       OFFICE FOLLOW-UP VISIT NOTE  [name] Kitchen Date of Birth:  1975-07-03 Medical Record Number:  [id number]   Referring MD: Richardson Chiquito PA-C  Reason for Referral: Stroke HPI: Initial visit 08/06/2020 [name] is a 47 year old pleasant African-American lady seen today for initial office consultation visit for stroke.  History is obtained from the patient, review of electronic medical records and I personally reviewed pertinent available imaging films in PACS.  She has past medical history of hypertension, hyperlipidemia, depression, migraines who presented initially initially on 03/04/2020 with sudden onset of left-sided weakness and numbness.  She presented outside time window for TPA. She was seen by telemetry specialist at [facility] and NIH stroke scale was 3.  MRI scan of the brain showed a small 8 mm right lateral thalamic acute lacunar infarct.  There are mild changes of small vessel disease.  MR angiogram of the brain showed no significant large vessel stenosis.  Carotid ultrasound showed no significant extracranial stenosis.  Transthoracic echo showed normal ejection fraction of 60 to 65% without cardiac source of embolism.  LDL cholesterol was 96 mg percent and hemoglobin A1c was 5.6.  ANA panel was negative antiphospholipid antibodies were negative.  Homocysteine level was normal.  RPR was negative.  ESR was normal.  Patient was started on aspirin Plavix for 3 weeks and subsequently switched to aspirin alone.  She did well with the regaining strength on the left side but has residual paresthesias still.  She is currently doing outpatient physical occupational therapy.  She returned to the ER on 05/19/2020 with headache as well as back pain.  Repeat MRI of the brain showed no acute abnormality.  Patient states she remains on aspirin she is tolerating well without bleeding or bruising.   Blood pressures well controlled today it is 133/81.  She was on hormonal injections 3 times a week at the time of the stroke since then her gynecologist/changes to hormonal implant.  Her migraines are much improved and now occur around once a month or so.  She is on low-dose Topamax 25 mg daily for migraine prevention and tolerating it well without side effects.  She does admit to snoring and having disturbed sleep but she has never been evaluated for sleep apnea Update 01/03/2023 ; Patient returns for follow-up after last visit 2 and half years ago.  She states she is doing well from the stroke standpoint without recurrent stroke or TIA symptoms.  She had some left-sided weakness and clumsiness which appears to have improved.  She remains on aspirin which is tolerating well without bruising or bleeding.  She states her blood pressure is under good control.  She is tolerating Crestor well without muscle aches and pains.  She has a new complaint of right hip and leg pain.  This has been going on for the last year and a half.  The pain is intermittent described as sharp shooting going down the back of her buttock into the lateral aspect of the right thigh up to the knee.  She has seen Dr. Hermelinda Medicus from rehab close prescribed some Lyrica and she has been taking 50 mg 3 times daily for the last couple of months without much relief.  She has not tried higher dose yet.  She has not had any recent back x-rays or MRI done.  She continues to have migraines which she sees still not well-controlled.  They occur 2 or 3 times a week.  She takes Topamax XR 50 mg at night and is tolerating it well without side effects.  She has not tried a higher dose yet.  She was seen in the emergency room on 09/18/2022 for headache and left-sided numbness.  MRI scan of the brain was obtained which showed no acute abnormality.  Echocardiogram showed ejection fraction of 55 to 60%.  Hemoglobin A1c was 5.5 and LDL cholesterol was 68 mg percent.  She  was treated with a migraine cocktail and thought to have her typical migraine symptoms resolved.  ROS:  14 system review of systems is positive for back pain, right hip pain, leg pain difficulty walking, numbness, tingling and weakness in all other systems negative  PMH:  Past Medical History:  Diagnosis Date   Acute CVA (cerebrovascular accident) (HCC) 03/04/2020   Acute left-sided weakness 03/05/2020   Anemia    Depression    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   Hypertension    Lumbar radiculitis 06/23/2021   Migraine    PTSD (post-traumatic stress disorder) 05/01/2017   Round ligament pain 11/12/2014    Social History:  Social History   Socioeconomic History   Marital status: Significant Other    Spouse name: Estate manager/land agent   Number of children: 8   Years of education: 9   Highest education level: Not on file  Occupational History   Occupation: unemployed    Comment: disabled  Tobacco Use   Smoking status: Never   Smokeless tobacco: Never  Vaping Use   Vaping status: Never Used  Substance and Sexual Activity   Alcohol use: Never   Drug use: No   Sexual activity: Yes    Birth control/protection: I.U.D.    Comment: followed by GYN  Other Topics Concern   Not on file  Social History Narrative   Lives with fiance and kids   Right Handed   Drinks >12 cans of soda in caffeine   Social Determinants of Health   Financial Resource Strain: Low Risk  (05/11/2022)   Overall Financial Resource Strain (CARDIA)    Difficulty of Paying Living Expenses: Not hard at all  Food Insecurity: No Food Insecurity (05/11/2022)   Hunger Vital Sign    Worried About Running Out of Food in the Last Year: Never true    Ran Out of Food in the Last Year: Never true  Transportation Needs: No Transportation Needs (05/11/2022)   PRAPARE - Administrator, Civil Service (Medical): No    Lack of Transportation (Non-Medical): No  Physical Activity: Not on file  Stress: Not on file   Social Connections: Not on file  Intimate Partner Violence: Not At Risk (05/11/2022)   Humiliation, Afraid, Rape, and Kick questionnaire    Fear of Current or Ex-Partner: No    Emotionally Abused: No    Physically Abused: No    Sexually Abused: No    Medications:   Current Outpatient Medications on File Prior to Visit  Medication Sig Dispense Refill   amLODipine (NORVASC) 10 MG tablet Take 1 tablet (10 mg total) by mouth daily. 90 tablet 3   fluticasone (FLONASE) 50 MCG/ACT nasal spray Use 2 sprays in each nostril BID for a week. After 1 week, decrease to 1 spray in each nostril BID as needed for congestion/allergies. 16 g 6   ibuprofen (ADVIL) 600 MG tablet Take 1 tablet (600 mg total) by mouth every 6 (six) hours as needed. 30 tablet 0  pregabalin (LYRICA) 50 MG capsule Take 1 capsule (50 mg total) by mouth 3 (three) times daily. 90 capsule 6   rosuvastatin (CRESTOR) 10 MG tablet Take 1 tablet (10 mg total) by mouth daily. 90 tablet 3   tiZANidine (ZANAFLEX) 4 MG tablet Take 4 mg by mouth 2 (two) times daily.     traZODone (DESYREL) 100 MG tablet Take 1 tablet (100 mg total) by mouth at bedtime. 90 tablet 5   PARAGARD INTRAUTERINE COPPER IU by Intrauterine route. (Patient not taking: Reported on 01/03/2023)     topiramate ER (QUDEXY XR) 50 MG CS24 sprinkle capsule Take 1 capsule (50 mg total) by mouth at bedtime. 60 capsule 0   No current facility-administered medications on file prior to visit.    Allergies:  No Known Allergies  Physical Exam General: well developed, well nourished, seated, in no evident distress Head: head normocephalic and atraumatic.   Neck: supple with no carotid or supraclavicular bruits Cardiovascular: regular rate and rhythm, no murmurs Musculoskeletal: no deformity.  Favors her back due to pain and spasm Skin:  no rash/petichiae Vascular:  Normal pulses all extremities  Neurologic Exam Mental Status: Awake and fully alert. Oriented to place and time.  Recent and remote memory intact. Attention span, concentration and fund of knowledge appropriate. Mood and affect appropriate.  Cranial Nerves: Fundoscopic exam reveals sharp disc margins. Pupils equal, briskly reactive to light. Extraocular movements full without nystagmus. Visual fields full to confrontation. Hearing intact. Facial sensation intact. Face, tongue, palate moves normally and symmetrically.  Motor: Normal bulk and tone. Normal strength in all tested extremity muscles.  Diminished fine finger movements on the left.  Orbits right over left upper extremity. Sensory.:  Subjective diminished left lower face and upper and lower extremity pinprick , position and vibratory sensation.  Coordination: Rapid alternating movements normal in all extremities. Finger-to-nose and heel-to-shin performed accurately bilaterally. Gait and Station: Arises from chair with  difficulty. Stance is antalgic and favoring her back.  Drags left leg due to pain gait demonstrates normal stride length and balance .  Reflexes: 1+ and symmetric. Toes downgoing.   NIHSS  1 Modified Rankin  2  ASSESSMENT: 47 year old African-American lady with right thalamic lacunar infarct in October 2021 secondary to small vessel disease with residual poststroke aresthesias.  Vascular risk factors of hypertension, hyperlipidemia, obesity and at risk for sleep apnea.  She also has longstanding history of migraines which appears suboptimally controlled on the present low-dose of Topamax XR.  New complaint of right hip and leg pain for the last year and a half likely sciatica from lumbosacral radiculopathy.     PLAN: I had a long d/w patient about her remote thalamic lacunar stroke, risk for recurrent stroke/TIAs, personally independently reviewed imaging studies and stroke evaluation results and answered questions.Continue aspirin 81 mg daily  for secondary stroke prevention and maintain strict control of hypertension with blood pressure  goal below 130/90, diabetes with hemoglobin A1c goal below 6.5% and lipids with LDL cholesterol goal below 70 mg/dL. I also advised the patient to eat a healthy diet with plenty of whole grains, cereals, fruits and vegetables, exercise regularly and maintain ideal body weight.  I recommend she increase her  Qudexy dose to 100 mg at night to help with her chronic headaches as well as Shadrick pain.  Increase dose of Lyrica to 100 mg 3 times daily to help with sciatica.  Check MRI scan of the lumbar spine.  Check screening carotid ultrasound.  Followup in  the future with my nurse practitioner Shanda [name] in 3 months or call earlier if necessary.  Greater than 50% time during this 45-minute  visit was spent on counseling and coordination of care about thalamic stroke and post stroke paresthesias. Delia Heady, MD Note: This document was prepared with digital dictation and possible smart phrase technology. Any transcriptional errors that result from this process are unintentional.

## 2023-01-06 ENCOUNTER — Encounter: Payer: Self-pay | Admitting: Internal Medicine

## 2023-01-06 ENCOUNTER — Ambulatory Visit (INDEPENDENT_AMBULATORY_CARE_PROVIDER_SITE_OTHER): Payer: Medicare Other | Admitting: Internal Medicine

## 2023-01-06 VITALS — BP 129/74 | HR 87 | Ht 65.0 in | Wt 213.6 lb

## 2023-01-06 DIAGNOSIS — F331 Major depressive disorder, recurrent, moderate: Secondary | ICD-10-CM

## 2023-01-06 DIAGNOSIS — G43809 Other migraine, not intractable, without status migrainosus: Secondary | ICD-10-CM

## 2023-01-06 DIAGNOSIS — E785 Hyperlipidemia, unspecified: Secondary | ICD-10-CM

## 2023-01-06 DIAGNOSIS — L700 Acne vulgaris: Secondary | ICD-10-CM

## 2023-01-06 DIAGNOSIS — I10 Essential (primary) hypertension: Secondary | ICD-10-CM | POA: Diagnosis not present

## 2023-01-06 DIAGNOSIS — E782 Mixed hyperlipidemia: Secondary | ICD-10-CM

## 2023-01-06 MED ORDER — CLINDAMYCIN PHOSPHATE 1 % EX GEL
Freq: Two times a day (BID) | CUTANEOUS | 0 refills | Status: DC
Start: 2023-01-06 — End: 2023-02-22

## 2023-01-06 NOTE — Assessment & Plan Note (Signed)
 Normal exam with stable BP on amlodipine. No concerns or side effects to current medication. No change in regimen; continue low sodium diet.

## 2023-01-06 NOTE — Progress Notes (Signed)
Date:  01/06/2023   Name:  Joyce [name]   DOB:  [birth year]   MRN:  [id number]   Chief Complaint: Hyperlipidemia and Hypertension  Hypertension This is a chronic problem. The problem is controlled. Associated symptoms include headaches. Pertinent negatives include no chest pain or shortness of breath. Past treatments include calcium channel blockers. The current treatment provides significant improvement. There are no compliance problems.  There is no history of kidney disease, CAD/MI or CVA.  Hyperlipidemia This is a chronic problem. The problem is controlled. Pertinent negatives include no chest pain or shortness of breath. Current antihyperlipidemic treatment includes statins.  Acne - she has cystic lesions on both cheeks that do not drain.  These are painful and often itch.  She has used over the counter agents which only cause dryness.  Lab Results  Component Value Date   NA 135 09/18/2022   K 3.5 09/18/2022   CO2 22 09/18/2022   GLUCOSE 84 09/18/2022   BUN 8 09/18/2022   CREATININE 0.67 09/18/2022   CALCIUM 8.8 (L) 09/18/2022   EGFR 109 08/15/2022   GFRNONAA >60 09/18/2022   Lab Results  Component Value Date   CHOL 119 09/18/2022   HDL 37 (L) 09/18/2022   LDLCALC 68 09/18/2022   TRIG 68 09/18/2022   CHOLHDL 3.2 09/18/2022   Lab Results  Component Value Date   TSH 0.688 08/15/2022   Lab Results  Component Value Date   HGBA1C 5.5 09/18/2022   Lab Results  Component Value Date   WBC 7.8 09/18/2022   HGB 10.3 (L) 09/18/2022   HCT 33.8 (L) 09/18/2022   MCV 69.5 (L) 09/18/2022   PLT 315 09/18/2022   Lab Results  Component Value Date   ALT 15 09/18/2022   AST 18 09/18/2022   ALKPHOS 35 (L) 09/18/2022   BILITOT 0.7 09/18/2022   Lab Results  Component Value Date   VD25OH 33 02/15/2017     Review of Systems  Constitutional:  Positive for unexpected weight change (has lost weight with diet changes and exercise). Negative for chills, fatigue and fever.   HENT:  Negative for trouble swallowing.   Respiratory:  Negative for chest tightness and shortness of breath.   Cardiovascular:  Negative for chest pain and leg swelling.  Musculoskeletal:  Positive for back pain (and hip pain on right).  Neurological:  Positive for headaches. Negative for dizziness.  Psychiatric/Behavioral:  Positive for dysphoric mood and sleep disturbance. The patient is nervous/anxious.     Patient Active Problem List   Diagnosis Date Noted   Sleep disorder 12/14/2022   Paresthesia of skin 09/18/2022   Mixed hyperlipidemia 09/18/2022   Primary osteoarthritis of knee 09/14/2022   Iron deficiency anemia 01/11/2022   Hamstring tendinitis at origin 10/13/2021   Lumbar radiculitis 06/23/2021   Left lateral epicondylitis 06/23/2021   Chronic left-sided low back pain without sciatica 07/01/2020   Neuropathic pain 05/06/2020   Anxiety state    Dyslipidemia    Migraine without status migrainosus, not intractable    History of stroke 03/10/2020   Left hemiparesis (HCC) 03/10/2020   Recurrent falls 03/05/2020   MDD (major depressive disorder), recurrent episode, moderate (HCC) 05/01/2017   PTSD (post-traumatic stress disorder) 05/01/2017   Essential hypertension 07/20/2016   Uterine fibroid 01/27/2016   Cervical high risk HPV (human papillomavirus) test positive 12/08/2014    No Known Allergies  Past Surgical History:  Procedure Laterality Date   CHOLECYSTECTOMY     COLONOSCOPY WITH PROPOFOL  N/A 02/09/2022   Procedure: COLONOSCOPY WITH PROPOFOL;  Surgeon: Toney Reil, MD;  [facility] ENDOSCOPY;  Service: Gastroenterology;  Laterality: N/A;    Social History   Tobacco Use   Smoking status: Never   Smokeless tobacco: Never  Vaping Use   Vaping status: Never Used  Substance Use Topics   Alcohol use: Never   Drug use: No     Medication list has been reviewed and updated.  Current Meds  Medication Sig   amLODipine (NORVASC) 10 MG tablet  Take 1 tablet (10 mg total) by mouth daily.   clindamycin (CLINDAGEL) 1 % gel Apply topically 2 (two) times daily.   fluticasone (FLONASE) 50 MCG/ACT nasal spray Use 2 sprays in each nostril BID for a week. After 1 week, decrease to 1 spray in each nostril BID as needed for congestion/allergies.   ibuprofen (ADVIL) 600 MG tablet Take 1 tablet (600 mg total) by mouth every 6 (six) hours as needed.   PARAGARD INTRAUTERINE COPPER IU by Intrauterine route.   pregabalin (LYRICA) 100 MG capsule Take 1 capsule (100 mg total) by mouth 3 (three) times daily.   rosuvastatin (CRESTOR) 10 MG tablet Take 1 tablet (10 mg total) by mouth daily.   tiZANidine (ZANAFLEX) 4 MG tablet Take 4 mg by mouth 2 (two) times daily.   topiramate ER (QUDEXY XR) 50 MG CS24 sprinkle capsule Take 2 capsules (100 mg total) by mouth at bedtime.   traZODone (DESYREL) 100 MG tablet Take 1 tablet (100 mg total) by mouth at bedtime.       01/06/2023    3:49 PM 08/15/2022    4:17 PM 05/11/2022    3:25 PM 01/11/2022    3:06 PM  GAD 7 : Generalized Anxiety Score  Nervous, Anxious, on Edge 2 1 3 3   Control/stop worrying 3 3 3 3   Worry too much - different things 3 3 3 3   Trouble relaxing 3 2 3 3   Restless 3 2 2 3   Easily annoyed or irritable 3 2 2 3   Afraid - awful might happen 2 0 1 3  Total GAD 7 Score 19 13 17 21   Anxiety Difficulty Somewhat difficult Somewhat difficult Somewhat difficult Extremely difficult       01/06/2023    3:49 PM 12/14/2022    2:55 PM 08/15/2022    4:17 PM  Depression screen PHQ 2/9  Decreased Interest 1 1 1   Down, Depressed, Hopeless 1 1 2   PHQ - 2 Score 2 2 3   Altered sleeping 3  3  Tired, decreased energy 3  2  Change in appetite 3  0  Feeling bad or failure about yourself  2  0  Trouble concentrating 2  3  Moving slowly or fidgety/restless 0  0  Suicidal thoughts 0  0  PHQ-9 Score 15  11  Difficult doing work/chores Somewhat difficult  Very difficult    BP Readings from Last 3  Encounters:  01/06/23 129/74  01/03/23 121/71  12/14/22 138/83    Physical Exam Constitutional:      Appearance: Normal appearance.  Neck:     Vascular: No carotid bruit.  Cardiovascular:     Rate and Rhythm: Normal rate and regular rhythm.     Pulses: Normal pulses.  Pulmonary:     Effort: Pulmonary effort is normal.     Breath sounds: No wheezing or rhonchi.  Musculoskeletal:     Cervical back: Normal range of motion.     Right lower  leg: No edema.     Left lower leg: No edema.  Lymphadenopathy:     Cervical: No cervical adenopathy.  Neurological:     Mental Status: She is alert.  Psychiatric:        Mood and Affect: Mood normal.        Behavior: Behavior normal.     Wt Readings from Last 3 Encounters:  01/06/23 213 lb 9.6 oz (96.9 kg)  01/03/23 214 lb 6.4 oz (97.3 kg)  12/14/22 218 lb 3.2 oz (99 kg)    BP 129/74 (BP Location: Left Arm, Cuff Size: Large)   Pulse 87   Ht 5\' 5"  (1.651 m)   Wt 213 lb 9.6 oz (96.9 kg)   SpO2 99%   BMI 35.54 kg/m   Assessment and Plan:  Problem List Items Addressed This Visit       Unprioritized   Mixed hyperlipidemia    LDL is  Lab Results  Component Value Date   LDLCALC 68 09/18/2022  She resumed statin therapy in March and labs 6 weeks later were improved. Currently being treated with Crestor with good compliance and no concerns.       Migraine without status migrainosus, not intractable (Chronic)    Recently seen by Neurology He started her on Qudexy XR at bedtime He wants her to do PT for her low back/hip but she prefers her own walking program      MDD (major depressive disorder), recurrent episode, moderate (HCC) (Chronic)    She continues to take Trazodone for sleep but weaned of Vraylar 6 mo ago She feels that she is doing well overall - some days she has more depression and anxiety but does not want to take medications at this time      Essential hypertension - Primary (Chronic)    Normal exam with  stable BP on amlodipine . No concerns or side effects to current medication. No change in regimen; continue low sodium diet.       Dyslipidemia (Chronic)    LDL is  Lab Results  Component Value Date   LDLCALC 68 09/18/2022   Currently being treated with crestor in place of atorvastatin with good compliance and no concerns.       Other Visit Diagnoses     Cystic acne       Relevant Medications   clindamycin (CLINDAGEL) 1 % gel       Return in about 6 months (around 07/09/2023) for CPX.    [name], MD [facility] [place]

## 2023-01-06 NOTE — Assessment & Plan Note (Signed)
Recently seen by Neurology He started her on Qudexy XR at bedtime He wants her to do PT for her low back/hip but she prefers her own walking program

## 2023-01-06 NOTE — Assessment & Plan Note (Signed)
LDL is  Lab Results  Component Value Date   LDLCALC 68 09/18/2022  She resumed statin therapy in March and labs 6 weeks later were improved. Currently being treated with Crestor with good compliance and no concerns.

## 2023-01-06 NOTE — Assessment & Plan Note (Signed)
She continues to take Trazodone for sleep but weaned of Vraylar 6 mo ago She feels that she is doing well overall - some days she has more depression and anxiety but does not want to take medications at this time

## 2023-01-06 NOTE — Assessment & Plan Note (Signed)
LDL is  Lab Results  Component Value Date   LDLCALC 68 09/18/2022   Currently being treated with crestor in place of atorvastatin with good compliance and no concerns.

## 2023-01-10 ENCOUNTER — Other Ambulatory Visit (HOSPITAL_COMMUNITY): Payer: Self-pay

## 2023-01-18 ENCOUNTER — Ambulatory Visit (HOSPITAL_COMMUNITY)
Admission: RE | Admit: 2023-01-18 | Discharge: 2023-01-18 | Disposition: A | Discharge status: Home / Self Care | Payer: Medicare Other | Source: Ambulatory Visit | Attending: Neurology | Admitting: Neurology

## 2023-01-18 DIAGNOSIS — Z8673 Personal history of transient ischemic attack (TIA), and cerebral infarction without residual deficits: Secondary | ICD-10-CM | POA: Insufficient documentation

## 2023-01-24 ENCOUNTER — Telehealth: Payer: Self-pay

## 2023-01-24 ENCOUNTER — Other Ambulatory Visit (HOSPITAL_COMMUNITY): Payer: Self-pay

## 2023-01-24 NOTE — Telephone Encounter (Signed)
Pharmacy Patient Advocate Encounter   Received notification from CoverMyMeds that prior authorization for Qudexy XR 50MG  er sprinkle capsules is required/requested.   Insurance verification completed.   The patient is insured through [facility] .   Per test claim: PA required; PA submitted to [facility] via CoverMyMeds Key/confirmation #/EOC [id number] Status is pending

## 2023-01-25 ENCOUNTER — Other Ambulatory Visit (HOSPITAL_COMMUNITY): Payer: Self-pay

## 2023-01-25 NOTE — Telephone Encounter (Signed)
Pharmacy Patient Advocate Encounter  Received notification from [facility] that Prior Authorization for Qudexy XR 50MG  er sprinkle capsules has been APPROVED from 01/24/2023 to 05/29/2098. Ran test claim, Copay is $1.55 per 60DS/120 Capsules-BRAND NAME. This test claim was processed through [facility]- copay amounts may vary at other pharmacies due to pharmacy/plan contracts, or as the patient moves through the different stages of their insurance plan.   PA #/Case ID/Reference #: PA Case ID #: [id number]

## 2023-01-28 ENCOUNTER — Ambulatory Visit
Admission: RE | Admit: 2023-01-28 | Discharge: 2023-01-28 | Disposition: A | Discharge status: Home / Self Care | Payer: Medicare Other | Source: Ambulatory Visit | Attending: Neurology | Admitting: Neurology

## 2023-01-28 DIAGNOSIS — M5431 Sciatica, right side: Secondary | ICD-10-CM | POA: Diagnosis not present

## 2023-01-30 NOTE — Progress Notes (Signed)
Kindly inform the patient that MRI scan of the lumbar spine shows changes of wear-and-tear most prominent between the fourth and the fifth vertebrae well there is narrowing of the bony canal through which the nerve roots come out and they may be possibly pinched.  These changes appear worsened compared with previous MRI from 07/04/2021

## 2023-01-30 NOTE — Progress Notes (Signed)
Kindly inform the patient that carotid ultrasound study shows no significant blockages of either carotid artery in the neck on both sides

## 2023-01-31 ENCOUNTER — Other Ambulatory Visit: Payer: Self-pay | Admitting: Neurology

## 2023-01-31 ENCOUNTER — Telehealth: Payer: Self-pay

## 2023-01-31 DIAGNOSIS — M5431 Sciatica, right side: Secondary | ICD-10-CM

## 2023-01-31 NOTE — Telephone Encounter (Signed)
-----   Message from Delia Heady sent at 01/30/2023  9:08 PM EDT ----- Joneen Roach inform the patient that MRI scan of the lumbar spine shows changes of wear-and-tear most prominent between the fourth and the fifth vertebrae well there is narrowing of the bony canal through which the nerve roots come out and they may be possibly pinched.  These changes appear worsened compared with previous MRI from 07/04/2021

## 2023-01-31 NOTE — Telephone Encounter (Signed)
Called patient and informed her of MRI and ultrasound results. Patient states she would like an referral to seen a neurosurgical for future treatment for for pain that's getting worse.  Dr. Pearlean Brownie "Kindly inform the patient that carotid ultrasound study shows no significant blockages of either carotid artery in the neck on both sides" Pt verbalized understanding.

## 2023-02-02 ENCOUNTER — Telehealth: Payer: Self-pay | Admitting: Neurology

## 2023-02-02 NOTE — Telephone Encounter (Signed)
Referral for neurosurgery fax to [facility]. Phone: [phone], Fax: [phone] 

## 2023-02-09 ENCOUNTER — Other Ambulatory Visit: Payer: Self-pay

## 2023-02-09 ENCOUNTER — Telehealth: Payer: Self-pay | Admitting: Internal Medicine

## 2023-02-09 ENCOUNTER — Ambulatory Visit: Payer: Self-pay | Admitting: *Deleted

## 2023-02-09 DIAGNOSIS — L700 Acne vulgaris: Secondary | ICD-10-CM

## 2023-02-09 NOTE — Telephone Encounter (Signed)
Copied from CRM [id number]. Topic: Referral - Request for Referral >> Feb 09, 2023  9:31 AM Marlow Baars wrote: Has patient seen PCP for this complaint? Yes.   Referral for which specialty: Dermatology Preferred provider/office: [facility] in [place] or [place] that takes her insurance Reason for referral: Broke out on face and itching real bad  The aptient states the cream the provider prescribed has not helped at all and that is why she wants the referral to see a dermatologist. Please assist patient further

## 2023-02-09 NOTE — Telephone Encounter (Signed)
  Chief Complaint: rash to chin/ jaw worsening draining Symptoms: rash , "bumps" to chin and jaw area worse on right side of face. Multiple bumps draining clear to yellow painful to touch, itching. Size dime on some areas, cool compress  and/or creams applied without relief of pain and itching . Frequency: 1 month  Pertinent Negatives: Patient denies fever  Disposition: [] ED /[] Urgent Care (no appt availability in office) / [x] Appointment(In office/virtual)/ []  [place] [facility]/ [] Home Care/ [] Refused Recommended Disposition /[] [place] [facility]/ []  Follow-up with PCP Additional Notes:   Appt scheduled 02/28/23 earliest available. Please advise if patient can be seen earlier. Recommended if sx worsen rash spreads go to UC/ ED. Requesting a call back what other medications will help with pain.    Reason for Disposition  Localized rash present > 7 days  Answer Assessment - Initial Assessment Questions 1. APPEARANCE of RASH: "Describe the rash."      Bumps inside skin dark spots  2. LOCATION: "Where is the rash located?"      Chin cheeks neck  3. NUMBER: "How many spots are there?"      Multiple  4. SIZE: "How big are the spots?" (Inches, centimeters or compare to size of a coin)      Some are dime size  5. ONSET: "When did the rash start?"      1 month  6. ITCHING: "Does the rash itch?" If Yes, ask: "How bad is the itch?"  (Scale 0-10; or none, mild, moderate, severe)     Itching  7. PAIN: "Does the rash hurt?" If Yes, ask: "How bad is the pain?"  (Scale 0-10; or none, mild, moderate, severe)    - NONE (0): no pain    - MILD (1-3): doesn't interfere with normal activities     - MODERATE (4-7): interferes with normal activities or awakens from sleep     - SEVERE (8-10): excruciating pain, unable to do any normal activities     Pain to touch  8. OTHER SYMPTOMS: "Do you have any other symptoms?" (e.g., fever)     Draining clear to bloody to yellow  9. PREGNANCY: "Is there any  chance you are pregnant?" "When was your last menstrual period?"     na  Protocols used: Rash or Redness - Localized-A-AH

## 2023-02-09 NOTE — Telephone Encounter (Signed)
Referral sent to [facility] for Cystic Acne.  - CM

## 2023-02-10 NOTE — Telephone Encounter (Signed)
PC to pt, offered a Monday appointment refused for now, will call around to other Dermatotomy to see where she can get in sooner. Will let us know where to send the referral

## 2023-02-22 ENCOUNTER — Other Ambulatory Visit: Payer: Self-pay | Admitting: Internal Medicine

## 2023-02-22 DIAGNOSIS — L700 Acne vulgaris: Secondary | ICD-10-CM

## 2023-02-22 MED ORDER — CLINDAMYCIN PHOSPHATE 1 % EX GEL
Freq: Two times a day (BID) | CUTANEOUS | 0 refills | Status: AC
Start: 2023-02-22 — End: ?

## 2023-02-23 ENCOUNTER — Other Ambulatory Visit: Payer: Self-pay | Admitting: Internal Medicine

## 2023-02-23 DIAGNOSIS — I10 Essential (primary) hypertension: Secondary | ICD-10-CM

## 2023-02-23 NOTE — Telephone Encounter (Signed)
Please advise 

## 2023-02-23 NOTE — Telephone Encounter (Signed)
Medication Refill - Medication: amLODipine (NORVASC) 10 MG tablet   Has the patient contacted their pharmacy? Yes.   (Agent: If no, request that the patient contact the pharmacy for the refill. If patient does not wish to contact the pharmacy document the reason why and proceed with request.) (Agent: If yes, when and what did the pharmacy advise?)  Preferred Pharmacy (with phone number or street name):  [facility] [address], [place] - [address]  [address] [place] [address]  Phone: [phone] Fax: [phone]   Has the patient been seen for an appointment in the last year OR does the patient have an upcoming appointment? Yes.    Agent: Please be advised that RX refills may take up to 3 business days. We ask that you follow-up with your pharmacy.

## 2023-02-23 NOTE — Telephone Encounter (Signed)
Requested medication (s) are due for refill today:   Requested medication (s) are on the active medication list: Yes  Last refill:  09/14/22  Future visit scheduled: Yes  Notes to clinic:  Last filled by Dr. Riley Kill.    Requested Prescriptions  Pending Prescriptions Disp Refills   amLODipine (NORVASC) 10 MG tablet 90 tablet 3    Sig: Take 1 tablet (10 mg total) by mouth daily.     Cardiovascular: Calcium Channel Blockers 2 Passed - 02/23/2023  9:37 AM      Passed - Last BP in normal range    BP Readings from Last 1 Encounters:  01/06/23 129/74         Passed - Last Heart Rate in normal range    Pulse Readings from Last 1 Encounters:  01/06/23 87         Passed - Valid encounter within last 6 months    Recent Outpatient Visits           1 month ago Essential hypertension   [place] [facility] at [facility], Nyoka Cowden, MD   6 months ago Dyslipidemia   [facility] at [facility], Nyoka Cowden, MD   9 months ago Chronic left-sided low back pain without sciatica   [place] [facility] at [facility], Nyoka Cowden, MD   1 year ago Essential hypertension   [place] [facility] at [facility], Nyoka Cowden, MD   1 year ago Essential hypertension   [place] [facility] at [facility], Nyoka Cowden, MD       Future Appointments             In 5 days Judithann Graves Nyoka Cowden, MD [facility] at [facility], [facility]   In 4 months Judithann Graves, Nyoka Cowden, MD [facility] at [facility], [facility]

## 2023-02-24 ENCOUNTER — Other Ambulatory Visit: Payer: Self-pay | Admitting: Internal Medicine

## 2023-02-24 DIAGNOSIS — L700 Acne vulgaris: Secondary | ICD-10-CM

## 2023-02-24 MED ORDER — CLINDAMYCIN PHOSPHATE 1 % EX SOLN
Freq: Two times a day (BID) | CUTANEOUS | 0 refills | Status: DC
Start: 2023-02-24 — End: 2024-03-26

## 2023-02-24 MED ORDER — AMLODIPINE BESYLATE 10 MG PO TABS
10.0000 mg | ORAL_TABLET | Freq: Every day | ORAL | 3 refills | Status: DC
Start: 2023-02-24 — End: 2024-03-27

## 2023-02-28 ENCOUNTER — Ambulatory Visit: Payer: Medicare Other | Admitting: Internal Medicine

## 2023-03-01 ENCOUNTER — Encounter: Payer: Self-pay | Admitting: Internal Medicine

## 2023-03-01 ENCOUNTER — Ambulatory Visit (INDEPENDENT_AMBULATORY_CARE_PROVIDER_SITE_OTHER): Payer: Medicare Other | Admitting: Internal Medicine

## 2023-03-01 VITALS — BP 129/76 | HR 87 | Ht 65.0 in | Wt 208.6 lb

## 2023-03-01 DIAGNOSIS — Z23 Encounter for immunization: Secondary | ICD-10-CM

## 2023-03-01 DIAGNOSIS — F331 Major depressive disorder, recurrent, moderate: Secondary | ICD-10-CM | POA: Diagnosis not present

## 2023-03-01 DIAGNOSIS — L700 Acne vulgaris: Secondary | ICD-10-CM | POA: Diagnosis not present

## 2023-03-01 DIAGNOSIS — E782 Mixed hyperlipidemia: Secondary | ICD-10-CM

## 2023-03-01 DIAGNOSIS — I10 Essential (primary) hypertension: Secondary | ICD-10-CM

## 2023-03-01 DIAGNOSIS — Z59812 Housing instability, housed, homelessness in past 12 months: Secondary | ICD-10-CM

## 2023-03-01 MED ORDER — ROSUVASTATIN CALCIUM 10 MG PO TABS
10.0000 mg | ORAL_TABLET | Freq: Every day | ORAL | 3 refills | Status: AC
Start: 2023-03-01 — End: ?

## 2023-03-01 NOTE — Assessment & Plan Note (Signed)
Blood pressure improved after sitting She reports no side effects to amlodipine and daily compliance Continue current regimen

## 2023-03-01 NOTE — Progress Notes (Signed)
Date:  03/01/2023   Name:  Joyce [name]   DOB:  1975-08-30   MRN:  [id number]   Chief Complaint: Rash (X2 months. Painful, itching. )  Rash This is a recurrent problem. The problem has been gradually improving since onset. The affected locations include the face. Rash characteristics: cystic acne lesions. Pertinent negatives include no fatigue or shortness of breath. Past treatments include antibiotics (antibiotics from UC - much improved; just started topical clindamycin yesterday).  Hypertension This is a chronic problem. The problem is controlled. Pertinent negatives include no chest pain, palpitations or shortness of breath. Past treatments include calcium channel blockers. The current treatment provides significant improvement. Hypertensive end-organ damage includes CVA.  Hyperlipidemia This is a chronic problem. The problem is controlled. Pertinent negatives include no chest pain or shortness of breath. Current antihyperlipidemic treatment includes statins.    Lab Results  Component Value Date   NA 135 09/18/2022   K 3.5 09/18/2022   CO2 22 09/18/2022   GLUCOSE 84 09/18/2022   BUN 8 09/18/2022   CREATININE 0.67 09/18/2022   CALCIUM 8.8 (L) 09/18/2022   EGFR 109 08/15/2022   GFRNONAA >60 09/18/2022   Lab Results  Component Value Date   CHOL 119 09/18/2022   HDL 37 (L) 09/18/2022   LDLCALC 68 09/18/2022   TRIG 68 09/18/2022   CHOLHDL 3.2 09/18/2022   Lab Results  Component Value Date   TSH 0.688 08/15/2022   Lab Results  Component Value Date   HGBA1C 5.5 09/18/2022   Lab Results  Component Value Date   WBC 7.8 09/18/2022   HGB 10.3 (L) 09/18/2022   HCT 33.8 (L) 09/18/2022   MCV 69.5 (L) 09/18/2022   PLT 315 09/18/2022   Lab Results  Component Value Date   ALT 15 09/18/2022   AST 18 09/18/2022   ALKPHOS 35 (L) 09/18/2022   BILITOT 0.7 09/18/2022   Lab Results  Component Value Date   VD25OH 33 02/15/2017     Review of Systems  Constitutional:   Negative for chills and fatigue.  Respiratory:  Negative for chest tightness and shortness of breath.   Cardiovascular:  Negative for chest pain and palpitations.  Skin:  Positive for rash.  Psychiatric/Behavioral:  Positive for dysphoric mood and sleep disturbance. The patient is nervous/anxious.     Patient Active Problem List   Diagnosis Date Noted   Cystic acne 03/01/2023   Sleep disorder 12/14/2022   Paresthesia of skin 09/18/2022   Mixed hyperlipidemia 09/18/2022   Primary osteoarthritis of knee 09/14/2022   Iron deficiency anemia 01/11/2022   Hamstring tendinitis at origin 10/13/2021   Lumbar radiculitis 06/23/2021   Left lateral epicondylitis 06/23/2021   Chronic left-sided low back pain without sciatica 07/01/2020   Neuropathic pain 05/06/2020   Anxiety state    Migraine without status migrainosus, not intractable    History of stroke 03/10/2020   Left hemiparesis (HCC) 03/10/2020   Recurrent falls 03/05/2020   MDD (major depressive disorder), recurrent episode, moderate (HCC) 05/01/2017   PTSD (post-traumatic stress disorder) 05/01/2017   Essential hypertension 07/20/2016   Uterine fibroid 01/27/2016   Cervical high risk HPV (human papillomavirus) test positive 12/08/2014    No Known Allergies  Past Surgical History:  Procedure Laterality Date   CHOLECYSTECTOMY     COLONOSCOPY WITH PROPOFOL N/A 02/09/2022   Procedure: COLONOSCOPY WITH PROPOFOL;  Surgeon: Toney Reil, MD;  Location: [facility] END[facility]COPY;  Service: Gastroenterology;  Laterality: N/A;    Social History  Tobacco Use   Smoking status: Never   Smokeless tobacco: Never  Vaping Use   Vaping status: Never Used  Substance Use Topics   Alcohol use: Never   Drug use: No     Medication list has been reviewed and updated.  Current Meds  Medication Sig   amLODipine (NORVASC) 10 MG tablet Take 1 tablet (10 mg total) by mouth daily.   clindamycin (CLEOCIN T) 1 % external solution Apply topically  2 (two) times daily.   fluticasone (FLONASE) 50 MCG/ACT nasal spray Use 2 sprays in each nostril BID for a week. After 1 week, decrease to 1 spray in each nostril BID as needed for congestion/allergies.   ibuprofen (ADVIL) 600 MG tablet Take 1 tablet (600 mg total) by mouth every 6 (six) hours as needed.   PARAGARD INTRAUTERINE COPPER IU by Intrauterine route.   pregabalin (LYRICA) 100 MG capsule Take 1 capsule (100 mg total) by mouth 3 (three) times daily.   tiZANidine (ZANAFLEX) 4 MG tablet Take 4 mg by mouth 2 (two) times daily.   topiramate ER (QUDEXY XR) 50 MG CS24 sprinkle capsule Take 2 capsules (100 mg total) by mouth at bedtime.   traZODone (DESYREL) 100 MG tablet Take 1 tablet (100 mg total) by mouth at bedtime.       03/01/2023    4:18 PM 01/06/2023    3:49 PM 08/15/2022    4:17 PM 05/11/2022    3:25 PM  GAD 7 : Generalized Anxiety Score  Nervous, Anxious, on Edge 3 2 1 3   Control/stop worrying 3 3 3 3   Worry too much - different things 3 3 3 3   Trouble relaxing 3 3 2 3   Restless 3 3 2 2   Easily annoyed or irritable 3 3 2 2   Afraid - awful might happen 3 2 0 1  Total GAD 7 Score 21 19 13 17   Anxiety Difficulty Extremely difficult Somewhat difficult Somewhat difficult Somewhat difficult       03/01/2023    4:17 PM 01/06/2023    3:49 PM 12/14/2022    2:55 PM  Depression screen PHQ 2/9  Decreased Interest 1 1 1   Down, Depressed, Hopeless 1 1 1   PHQ - 2 Score 2 2 2   Altered sleeping 1 3   Tired, decreased energy 1 3   Change in appetite 3 3   Feeling bad or failure about yourself  1 2   Trouble concentrating 1 2   Moving slowly or fidgety/restless 0 0   Suicidal thoughts 0 0   PHQ-9 Score 9 15   Difficult doing work/chores Somewhat difficult Somewhat difficult     BP Readings from Last 3 Encounters:  03/01/23 129/76  01/06/23 129/74  01/03/23 121/71    Physical Exam Vitals and nursing note reviewed.  Constitutional:      General: She is not in acute distress.     Appearance: Normal appearance. She is well-developed.  HENT:     Head: Normocephalic and atraumatic.  Cardiovascular:     Rate and Rhythm: Normal rate and regular rhythm.  Pulmonary:     Effort: Pulmonary effort is normal. No respiratory distress.     Breath sounds: No wheezing or rhonchi.  Skin:    General: Skin is warm and dry.     Findings: No rash.     Comments: Cystic acne lesions on both cheeks and chin  Neurological:     Mental Status: She is alert and oriented to person, place, and  time.  Psychiatric:        Attention and Perception: Attention normal.        Mood and Affect: Mood normal. Affect is tearful.        Speech: Speech normal.        Behavior: Behavior normal.        Thought Content: Thought content does not include suicidal ideation. Thought content does not include suicidal plan.     Wt Readings from Last 3 Encounters:  03/01/23 208 lb 9.6 oz (94.6 kg)  01/06/23 213 lb 9.6 oz (96.9 kg)  01/03/23 214 lb 6.4 oz (97.3 kg)    BP 129/76 (BP Location: Right Arm, Cuff Size: Large)   Pulse 87   Ht 5\' 5"  (1.651 m)   Wt 208 lb 9.6 oz (94.6 kg)   SpO2 99%   BMI 34.71 kg/m   Assessment and Plan:  Problem List Items Addressed This Visit       Unprioritized   Cystic acne - Primary    Continue Clindamycin topical daily.      Essential hypertension (Chronic)    Blood pressure improved after sitting She reports no side effects to amlodipine and daily compliance Continue current regimen      Relevant Medications   rosuvastatin (CRESTOR) 10 MG tablet   MDD (major depressive disorder), recurrent episode, moderate (HCC) (Chronic)    Not currently on any medications Recently homeless after leaving her fiancee.  She feels she needs psych counseling and care - will refer to [place]      Relevant Orders   Ambulatory referral to Psychiatry   Mixed hyperlipidemia    Recommend statin indefinitely due to hx of CVA Lab Results  Component Value Date    LDLCALC 68 09/18/2022  Continue Crestor - refill sent       Relevant Medications   rosuvastatin (CRESTOR) 10 MG tablet   Other Visit Diagnoses     Need for influenza vaccination       Relevant Orders   Flu vaccine trivalent PF, 6mos and older(Flulaval,Afluria,Fluarix,Fluzone) (Completed)   Housing instability after recent homelessness       Relevant Orders   AMB Referral to Managed Medicaid Care Management       No follow-ups on file.    [name], MD [facility] [place]

## 2023-03-01 NOTE — Assessment & Plan Note (Signed)
Continue Clindamycin topical daily.

## 2023-03-01 NOTE — Assessment & Plan Note (Addendum)
Not currently on any medications Recently homeless after leaving her fiancee.  She feels she needs psych counseling and care - will refer to [facility]

## 2023-03-01 NOTE — Assessment & Plan Note (Signed)
Recommend statin indefinitely due to hx of CVA Lab Results  Component Value Date   LDLCALC 68 09/18/2022  Continue Crestor - refill sent

## 2023-03-02 ENCOUNTER — Telehealth: Payer: Self-pay | Admitting: *Deleted

## 2023-03-02 NOTE — Progress Notes (Signed)
  Care Coordination   Note   03/02/2023 Name: KEYARRA RENDALL MRN: [id number] DOB: 08/29/1975  [name] is a 47 y.o. year old female who sees [name], MD for primary care. I reached out to [name] by phone today in response to a referral   Follow up plan:  Telephone appointment with care coordination team member scheduled for:  03/06/2023  Encounter Outcome:  Patient Scheduled  Burman Nieves, [facility] Care Coordination Care Guide Direct Dial: [phone]

## 2023-03-06 ENCOUNTER — Ambulatory Visit: Payer: Self-pay

## 2023-03-06 NOTE — Patient Instructions (Signed)
Visit Information  Thank you for taking time to visit with me today. Please don't hesitate to contact me if I can be of assistance to you.   Following are the goals we discussed today:  Patient will review [email or web address] and online search for apartments and call at least 10 within the next week.   Our next appointment is by telephone on 03/14/23 at 11am  Please call the care guide team at [phone] if you need to cancel or reschedule your appointment.   If you are experiencing a Mental Health or Behavioral Health Crisis or need someone to talk to, please call 911  Patient verbalizes understanding of instructions and care plan provided today and agrees to view in MyChart. Active MyChart status and patient understanding of how to access instructions and care plan via MyChart confirmed with patient.     Telephone follow up appointment with care management team member scheduled for: 03/14/23 at 11am.  Lysle Morales, BSW Social Worker  [phone]

## 2023-03-06 NOTE — Patient Outreach (Signed)
  Care Coordination   Initial Visit Note   03/06/2023 Name: Joyce [name] MRN: [id number] DOB: 12-Feb-1976  [name] is a 47 y.o. year old female who sees [name], MD for primary care. I spoke with  [name] by phone today.  What matters to the patients health and wellness today?  Patient was living with her fiance but left 3 weeks ago with her 2 children and their grandmother. Patient has been living in a hotel and is looking for stable housing.      Goals Addressed             This Visit's Progress    Housing       Interventions Today    Flowsheet Row Most Recent Value  Chronic Disease   Chronic disease during today's visit Hypertension (HTN), Other  [stroke]  General Interventions   General Interventions Discussed/Reviewed General Interventions Reviewed, General Interventions Discussed  [Pt reports she and children are homeless, living in hotel $360 wkly.Pt applied for [employer] but no apartments. Has $2000 income.SW provided website to search housing and pt will call at least 10 apt for openings.]              SDOH assessments and interventions completed:  Yes  SDOH Interventions Today    Flowsheet Row Most Recent Value  SDOH Interventions   Food Insecurity Interventions Intervention Not Indicated  Housing Interventions Intervention Not Indicated  [looking for housing]  Transportation Interventions Intervention Not Indicated  [Has a car]        Care Coordination Interventions:  Yes, provided   Follow up plan: Follow up call scheduled for 03/14/23 at 11am    Encounter Outcome:  Patient Visit Completed

## 2023-03-10 ENCOUNTER — Telehealth: Payer: Self-pay | Admitting: Internal Medicine

## 2023-03-10 ENCOUNTER — Other Ambulatory Visit: Payer: Self-pay

## 2023-03-10 ENCOUNTER — Ambulatory Visit: Payer: Self-pay | Admitting: *Deleted

## 2023-03-10 DIAGNOSIS — F331 Major depressive disorder, recurrent, moderate: Secondary | ICD-10-CM

## 2023-03-10 NOTE — Telephone Encounter (Signed)
  Chief Complaint: needs referral changed Symptoms: depression, anxiety Frequency: ongoing but worse Pertinent Negatives: Patient denies suicidal thought Disposition: [] ED /[x] Urgent Care (no appt availability in office) / [] Appointment(In office/virtual)/ []  [place] [facility]/ [] Home Care/ [] Refused Recommended Disposition /[] [place] [facility]/ []  Follow-up with PCP Additional Notes: Patient states she has a referral to psychiatry- but that was in [place] and she lives in [place]. She would like to get a referral to [facility], [address] . Patient states she really needs to talk to someone. Her situation is getting worse and she needs support. Patient advised of [facility] and address/number provided, also advised if she gets to the point she needs immediate help- ED is option. I will send the request to her PCP for updated referral.

## 2023-03-10 NOTE — Telephone Encounter (Signed)
Called patient and let her know she was discharged from [facility]. Asked if she would like to go to [facility] for psychiatry. Waiting for call back.  - Christop Hippert

## 2023-03-10 NOTE — Telephone Encounter (Signed)
Copied from CRM [id number]. Topic: Referral - Request for Referral >> Mar 10, 2023  4:06 PM Phill Myron wrote: Please call regarding a referral (mental health)  closer to the area she is residing.

## 2023-03-10 NOTE — Telephone Encounter (Signed)
Reason for Disposition  Symptoms interfere with work or school  Answer Assessment - Initial Assessment Questions 1. CONCERN: "What happened that made you call today?"     Increased  symptoms- needs to be seen 2. DEPRESSION SYMPTOM SCREENING: "How are you feeling overall?" (e.g., decreased energy, increased sleeping or difficulty sleeping, difficulty concentrating, feelings of sadness, guilt, hopelessness, or worthlessness)     Hurt and lost 3. RISK OF HARM - SUICIDAL IDEATION:  "Do you ever have thoughts of hurting or killing yourself?"  (e.g., yes, no, no but preoccupation with thoughts about death)   - INTENT:  "Do you have thoughts of hurting or killing yourself right NOW?" (e.g., yes, no, N/A)   - PLAN: "Do you have a specific plan for how you would do this?" (e.g., gun, knife, overdose, no plan, N/A)     Not suicidal  4. RISK OF HARM - HOMICIDAL IDEATION:  "Do you ever have thoughts of hurting or killing someone else?"  (e.g., yes, no, no but preoccupation with thoughts about death)   - INTENT:  "Do you have thoughts of hurting or killing someone right NOW?" (e.g., yes, no, N/A)   - PLAN: "Do you have a specific plan for how you would do this?" (e.g., gun, knife, no plan, N/A)      no 5. FUNCTIONAL IMPAIRMENT: "How have things been going for you overall? Have you had more difficulty than usual doing your normal daily activities?"  (e.g., better, same, worse; self-care, school, work, interactions)     has place to live- not the best place for place to be - hotel 6. SUPPORT: "Who is with you now?" "Who do you live with?" "Do you have family or friends who you can talk to?"      No one- patient has children- but no one else 7. THERAPIST: "Do you have a counselor or therapist? Name?"     Not since in [place] 8. STRESSORS: "Has there been any new stress or recent changes in your life?"     Yes- a lot has happened 9. ALCOHOL USE OR SUBSTANCE USE (DRUG USE): "Do you drink alcohol or use any illegal  drugs?"     no 10. OTHER: "Do you have any other physical symptoms right now?" (e.g., fever)       no  Protocols used: Depression-A-AH

## 2023-03-13 NOTE — Telephone Encounter (Signed)
Sent message to referrals team to complete.  KP

## 2023-03-13 NOTE — Telephone Encounter (Signed)
Spoke with pt and she agreed to be sent to [facility] so I placed referral.  - Joyce [name]

## 2023-03-14 ENCOUNTER — Ambulatory Visit: Payer: Self-pay

## 2023-03-14 NOTE — Patient Outreach (Signed)
  Care Coordination   03/14/2023 Name: Joyce [name] MRN: [id number] DOB: [birth year]   Care Coordination Outreach Attempts:  An unsuccessful telephone outreach was attempted for a scheduled appointment today.  Follow Up Plan:  Additional outreach attempts will be made to offer the patient care coordination information and services.   Encounter Outcome:  No Answer   Care Coordination Interventions:  No, not indicated    SIG Lysle Morales, BSW Social Worker [phone]

## 2023-03-28 ENCOUNTER — Encounter (HOSPITAL_COMMUNITY): Payer: Self-pay

## 2023-03-28 ENCOUNTER — Emergency Department (HOSPITAL_COMMUNITY)
Admission: EM | Admit: 2023-03-28 | Discharge: 2023-03-29 | Disposition: A | Discharge status: Home / Self Care | Payer: Medicare Other | Attending: Emergency Medicine | Admitting: Emergency Medicine

## 2023-03-28 ENCOUNTER — Other Ambulatory Visit: Payer: Self-pay

## 2023-03-28 ENCOUNTER — Telehealth: Payer: Self-pay | Admitting: *Deleted

## 2023-03-28 ENCOUNTER — Telehealth: Payer: Self-pay | Admitting: Internal Medicine

## 2023-03-28 ENCOUNTER — Emergency Department (HOSPITAL_COMMUNITY): Payer: Medicare Other

## 2023-03-28 DIAGNOSIS — N819 Female genital prolapse, unspecified: Secondary | ICD-10-CM | POA: Diagnosis not present

## 2023-03-28 DIAGNOSIS — E876 Hypokalemia: Secondary | ICD-10-CM | POA: Diagnosis not present

## 2023-03-28 DIAGNOSIS — R102 Pelvic and perineal pain: Secondary | ICD-10-CM | POA: Diagnosis present

## 2023-03-28 DIAGNOSIS — L700 Acne vulgaris: Secondary | ICD-10-CM

## 2023-03-28 LAB — CBC WITH DIFFERENTIAL/PLATELET
Abs Immature Granulocytes: 0.03 10*3/uL (ref 0.00–0.07)
Basophils Absolute: 0.1 10*3/uL (ref 0.0–0.1)
Basophils Relative: 1 %
Eosinophils Absolute: 0.1 10*3/uL (ref 0.0–0.5)
Eosinophils Relative: 1 %
HCT: 35.6 % — ABNORMAL LOW (ref 36.0–46.0)
Hemoglobin: 10.3 g/dL — ABNORMAL LOW (ref 12.0–15.0)
Immature Granulocytes: 0 %
Lymphocytes Relative: 24 %
Lymphs Abs: 2.7 10*3/uL (ref 0.7–4.0)
MCH: 19.1 pg — ABNORMAL LOW (ref 26.0–34.0)
MCHC: 28.9 g/dL — ABNORMAL LOW (ref 30.0–36.0)
MCV: 65.9 fL — ABNORMAL LOW (ref 80.0–100.0)
Monocytes Absolute: 0.6 10*3/uL (ref 0.1–1.0)
Monocytes Relative: 5 %
Neutro Abs: 8 10*3/uL — ABNORMAL HIGH (ref 1.7–7.7)
Neutrophils Relative %: 69 %
Platelets: 398 10*3/uL (ref 150–400)
RBC: 5.4 MIL/uL — ABNORMAL HIGH (ref 3.87–5.11)
RDW: 21.6 % — ABNORMAL HIGH (ref 11.5–15.5)
WBC: 11.5 10*3/uL — ABNORMAL HIGH (ref 4.0–10.5)
nRBC: 0 % (ref 0.0–0.2)

## 2023-03-28 LAB — COMPREHENSIVE METABOLIC PANEL
ALT: 18 U/L (ref 0–44)
AST: 24 U/L (ref 15–41)
Albumin: 4.1 g/dL (ref 3.5–5.0)
Alkaline Phosphatase: 49 U/L (ref 38–126)
Anion gap: 10 (ref 5–15)
BUN: 7 mg/dL (ref 6–20)
CO2: 24 mmol/L (ref 22–32)
Calcium: 9.2 mg/dL (ref 8.9–10.3)
Chloride: 104 mmol/L (ref 98–111)
Creatinine, Ser: 0.63 mg/dL (ref 0.44–1.00)
GFR, Estimated: >60 mL/min (ref >60)
Glucose, Bld: 104 mg/dL — ABNORMAL HIGH (ref 70–99)
Potassium: 2.9 mmol/L — ABNORMAL LOW (ref 3.5–5.1)
Sodium: 138 mmol/L (ref 135–145)
Total Bilirubin: 0.3 mg/dL (ref 0.3–1.2)
Total Protein: 8.1 g/dL (ref 6.5–8.1)

## 2023-03-28 LAB — URINALYSIS, ROUTINE W REFLEX MICROSCOPIC
Bacteria, UA: NONE SEEN
Bilirubin Urine: NEGATIVE
Glucose, UA: NEGATIVE mg/dL
Ketones, ur: NEGATIVE mg/dL
Leukocytes,Ua: NEGATIVE
Nitrite: NEGATIVE
Protein, ur: NEGATIVE mg/dL
Specific Gravity, Urine: 1.005 (ref 1.005–1.030)
pH: 8 (ref 5.0–8.0)

## 2023-03-28 LAB — PREGNANCY, URINE: Preg Test, Ur: NEGATIVE

## 2023-03-28 MED ORDER — KETOROLAC TROMETHAMINE 15 MG/ML IJ SOLN
15.0000 mg | Freq: Once | INTRAMUSCULAR | Status: AC
  Administered 2023-03-28: 15 mg via INTRAVENOUS
  Filled 2023-03-28: qty 1

## 2023-03-28 MED ORDER — POTASSIUM CHLORIDE CRYS ER 20 MEQ PO TBCR
40.0000 meq | EXTENDED_RELEASE_TABLET | ORAL | Status: AC
  Administered 2023-03-28: 40 meq via ORAL
  Filled 2023-03-28: qty 2

## 2023-03-28 MED ORDER — IOHEXOL 300 MG/ML  SOLN
100.0000 mL | Freq: Once | INTRAMUSCULAR | Status: AC | PRN
  Administered 2023-03-28: 100 mL via INTRAVENOUS

## 2023-03-28 NOTE — ED Triage Notes (Signed)
Vaginal pain Pt complains of pressure that started 3 days ago Pt stated she feels like she is going to push out a baby Spotting with now Has IUD

## 2023-03-28 NOTE — ED Notes (Signed)
Patient transported to CT 

## 2023-03-28 NOTE — Telephone Encounter (Signed)
She says the pregabalin was doing nothing for her ( primarily her legs) and the gabapentin worked better. She is not taking the pregabalin is what she told me.

## 2023-03-28 NOTE — ED Notes (Signed)
Vitals reassessed Pain 10/10 Pt placed back in lobby no MSE spots available

## 2023-03-28 NOTE — Telephone Encounter (Signed)
Copied from CRM [id number]. Topic: Referral - Request for Referral >> Mar 28, 2023 11:54 AM Phill Myron wrote: [facility], this place or any place she can get in this year. Patient states she will travel.

## 2023-03-28 NOTE — ED Notes (Signed)
ED Provider at bedside. 

## 2023-03-28 NOTE — ED Provider Notes (Signed)
[name] Provider Note   CSN: [id number] Arrival date & time: 03/28/23  1816     History {Add pertinent medical, surgical, social history, OB history to HPI:1} Chief Complaint  Patient presents with   Groin Swelling    Joyce [name] is a 47 y.o. female.  47 year old female G11 P8 with an IUD who presents to the emergency department with pelvic pressure.  Patient reports that several months ago started having pelvic pressure that felt like something was in her vagina and she needed to push to have a baby.  Says that it resolved but then recurred again 3 days ago and has been more bothersome.  Worsened when she stands up.  Says that she has been going to the bathroom frequently because of this but does not have any dysuria.  Currently on her menstrual cycle.  Denies any other abdominal pain, fevers, nausea or vomiting or bowel changes.  No urinary retention.  Denies any gynecologic, or abdominal surgeries.  No vaginal discharge.  Not sexually active and no concern for STIs.        Home Medications Prior to Admission medications   Medication Sig Start Date End Date Taking? Authorizing Provider  amLODipine (NORVASC) 10 MG tablet Take 1 tablet (10 mg total) by mouth daily. 02/24/23 02/24/24  [name], MD  clindamycin (CLEOCIN T) 1 % external solution Apply topically 2 (two) times daily. 02/24/23   [name], MD  fluticasone (FLONASE) 50 MCG/ACT nasal spray Use 2 sprays in each nostril BID for a week. After 1 week, decrease to 1 spray in each nostril BID as needed for congestion/allergies. 10/06/20   Heather Roberts, NP  ibuprofen (ADVIL) 600 MG tablet Take 1 tablet (600 mg total) by mouth every 6 (six) hours as needed. 06/07/20   Burgess Amor, PA-C  PARAGARD INTRAUTERINE COPPER IU by Intrauterine route.    [provider]  pregabalin (LYRICA) 100 MG capsule Take 1 capsule (100 mg total) by mouth 3 (three) times daily.  01/03/23   Micki Riley, MD  rosuvastatin (CRESTOR) 10 MG tablet Take 1 tablet (10 mg total) by mouth daily. 03/01/23   [name], MD  tiZANidine (ZANAFLEX) 4 MG tablet Take 4 mg by mouth 2 (two) times daily. 09/02/22   [provider]  topiramate ER (QUDEXY XR) 50 MG CS24 sprinkle capsule Take 2 capsules (100 mg total) by mouth at bedtime. 01/03/23 03/04/23  Micki Riley, MD  traZODone (DESYREL) 100 MG tablet Take 1 tablet (100 mg total) by mouth at bedtime. 12/16/22   Ranelle Oyster, MD      Allergies    Patient has no known allergies.    Review of Systems   Review of Systems  Physical Exam Updated Vital Signs BP (!) 158/82 (BP Location: Right Arm)   Pulse 87   Temp 98.7 F (37.1 C) (Oral)   Resp 18   Ht 5\' 5"  (1.651 m)   Wt 95.7 kg   SpO2 100%   BMI 35.11 kg/m  Physical Exam Vitals and nursing note reviewed.  Constitutional:      General: She is not in acute distress.    Appearance: She is well-developed.  HENT:     Head: Normocephalic and atraumatic.     Right Ear: External ear normal.     Left Ear: External ear normal.     Nose: Nose normal.  Eyes:     Extraocular Movements:  Extraocular movements intact.     Conjunctiva/sclera: Conjunctivae normal.     Pupils: Pupils are equal, round, and reactive to light.  Pulmonary:     Effort: Pulmonary effort is normal. No respiratory distress.  Abdominal:     General: Abdomen is flat. There is no distension.     Palpations: Abdomen is soft. There is no mass.     Tenderness: There is no abdominal tenderness. There is no guarding.  Musculoskeletal:     Cervical back: Normal range of motion and neck supple.     Right lower leg: No edema.     Left lower leg: No edema.  Skin:    General: Skin is warm and dry.  Neurological:     Mental Status: She is alert and oriented to person, place, and time. Mental status is at baseline.  Psychiatric:        Mood and Affect: Mood normal.     ED Results / Procedures  / Treatments   Labs (all labs ordered are listed, but only abnormal results are displayed) Labs Reviewed  URINALYSIS, ROUTINE W REFLEX MICROSCOPIC - Abnormal; Notable for the following components:      Result Value   Color, Urine STRAW (*)    Hgb urine dipstick MODERATE (*)    All other components within normal limits  CBC WITH DIFFERENTIAL/PLATELET - Abnormal; Notable for the following components:   WBC 11.5 (*)    RBC 5.40 (*)    Hemoglobin 10.3 (*)    HCT 35.6 (*)    MCV 65.9 (*)    MCH 19.1 (*)    MCHC 28.9 (*)    RDW 21.6 (*)    Neutro Abs 8.0 (*)    All other components within normal limits  COMPREHENSIVE METABOLIC PANEL - Abnormal; Notable for the following components:   Potassium 2.9 (*)    Glucose, Bld 104 (*)    All other components within normal limits  PREGNANCY, URINE    EKG None  Radiology No results found.  Procedures Pelvic exam  Date/Time: 03/28/2023 10:54 PM  Performed by: Rondel Baton, MD Authorized by: Rondel Baton, MD  Consent: Verbal consent obtained. Consent given by: patient Comments: Chaperoned by pt tech [facility].  External genitalia unremarkable. Nor rashes or lesions noted.  Speculum exam with scant amount of blood.  Vaginal wall mucosa is unremarkable.  Cervix visualized and is unremarkable (closed in appearance with small mild blood).  Bimanual exam without cervical motion tenderness, adnexal tenderness or any masses appreciated.       {Document cardiac monitor, telemetry assessment procedure when appropriate:1}  Medications Ordered in ED Medications  potassium chloride SA (KLOR-CON M) CR tablet 40 mEq (40 mEq Oral Given 03/28/23 2206)    ED Course/ Medical Decision Making/ A&P   {   Click here for ABCD2, HEART and other calculatorsREFRESH Note before signing :1}                              Medical Decision Making Amount and/or Complexity of Data Reviewed Labs: ordered. Radiology:  ordered.  Risk Prescription drug management.   ***  {Document critical care time when appropriate:1} {Document review of labs and clinical decision tools ie heart score, Chads2Vasc2 etc:1}  {Document your independent review of radiology images, and any outside records:1} {Document your discussion with family members, caretakers, and with consultants:1} {Document social determinants of health affecting pt's care:1} {Document your decision making  why or why not admission, treatments were needed:1} Final Clinical Impression(s) / ED Diagnoses Final diagnoses:  None    Rx / DC Orders ED Discharge Orders     None

## 2023-03-28 NOTE — Telephone Encounter (Signed)
Ms Joyce [name] called ans is asking to have her gabapentin refilled. It looks like at last visit she was taking the pregabalin but I verified with her that she is not taking that ("it doesnt work"), and prefers to resume gabapentin.

## 2023-03-28 NOTE — Telephone Encounter (Signed)
Placed referral to [employer] [facility].

## 2023-03-29 DIAGNOSIS — N819 Female genital prolapse, unspecified: Secondary | ICD-10-CM | POA: Diagnosis not present

## 2023-03-29 LAB — MAGNESIUM: Magnesium: 2 mg/dL (ref 1.7–2.4)

## 2023-03-29 MED ORDER — POTASSIUM CHLORIDE CRYS ER 20 MEQ PO TBCR: 20.0000 meq | EXTENDED_RELEASE_TABLET | Freq: Two times a day (BID) | ORAL | 0 refills | Status: DC

## 2023-03-29 MED ORDER — GABAPENTIN 600 MG PO TABS
600.0000 mg | ORAL_TABLET | Freq: Three times a day (TID) | ORAL | 3 refills | Status: DC
Start: 2023-03-29 — End: 2024-04-24

## 2023-03-29 MED ORDER — POTASSIUM CHLORIDE 20 MEQ PO PACK
60.0000 meq | PACK | Freq: Once | ORAL | Status: AC
  Administered 2023-03-29: 60 meq via ORAL
  Filled 2023-03-29: qty 3

## 2023-03-29 NOTE — Telephone Encounter (Signed)
I called and spoke to Joyce [name]. She felt that gabapentin was more effective even at the higher dose of lyrica. Sent rx to [facility] for 600mg  gabapentin tid

## 2023-03-29 NOTE — ED Provider Notes (Signed)
Care assumed from Dr. Eloise Harman, patient with pelvic discomfort, possible pelvic prolapse. CT of pelvis is pending, anticipate referral to GYN.  CT scan shows no acute findings, IUD in expected position.  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  Incidental finding of inguinal adenopathy.  I have independently viewed the images, and agree with the radiologist's interpretation.  I have explained these findings to the patient, I am referring her to gynecology for further outpatient workup.  Results for orders placed or performed during the hospital encounter of 03/28/23  Urinalysis, Routine w reflex microscopic -Urine, Clean Catch  Result Value Ref Range   Color, Urine STRAW (A) YELLOW   APPearance CLEAR CLEAR   Specific Gravity, Urine 1.005 1.005 - 1.030   pH 8.0 5.0 - 8.0   Glucose, UA NEGATIVE NEGATIVE mg/dL   Hgb urine dipstick MODERATE (A) NEGATIVE   Bilirubin Urine NEGATIVE NEGATIVE   Ketones, ur NEGATIVE NEGATIVE mg/dL   Protein, ur NEGATIVE NEGATIVE mg/dL   Nitrite NEGATIVE NEGATIVE   Leukocytes,Ua NEGATIVE NEGATIVE   RBC / HPF 0-5 0 - 5 RBC/hpf   WBC, UA 0-5 0 - 5 WBC/hpf   Bacteria, UA NONE SEEN NONE SEEN   Squamous Epithelial / HPF 0-5 0 - 5 /HPF   Mucus PRESENT   Pregnancy, urine  Result Value Ref Range   Preg Test, Ur NEGATIVE NEGATIVE  CBC with Differential  Result Value Ref Range   WBC 11.5 (H) 4.0 - 10.5 K/uL   RBC 5.40 (H) 3.87 - 5.11 MIL/uL   Hemoglobin 10.3 (L) 12.0 - 15.0 g/dL   HCT 78.2 (L) 95.6 - 21.3 %   MCV 65.9 (L) 80.0 - 100.0 fL   MCH 19.1 (L) 26.0 - 34.0 pg   MCHC 28.9 (L) 30.0 - 36.0 g/dL   RDW 08.6 (H) 57.8 - 46.9 %   Platelets 398 150 - 400 K/uL   nRBC 0.0 0.0 - 0.2 %   Neutrophils Relative % 69 %   Neutro Abs 8.0 (H) 1.7 - 7.7 K/uL   Lymphocytes Relative 24 %   Lymphs Abs 2.7 0.7 - 4.0 K/uL   Monocytes Relative 5 %   Monocytes Absolute 0.6 0.1 - 1.0 K/uL   Eosinophils Relative 1 %   Eosinophils Absolute 0.1 0.0 - 0.5 K/uL   Basophils Relative 1 %    Basophils Absolute 0.1 0.0 - 0.1 K/uL   WBC Morphology MORPHOLOGY UNREMARKABLE    RBC Morphology See Note    Smear Review MORPHOLOGY UNREMARKABLE    Immature Granulocytes 0 %   Abs Immature Granulocytes 0.03 0.00 - 0.07 K/uL   Polychromasia PRESENT   Comprehensive metabolic panel  Result Value Ref Range   Sodium 138 135 - 145 mmol/L   Potassium 2.9 (L) 3.5 - 5.1 mmol/L   Chloride 104 98 - 111 mmol/L   CO2 24 22 - 32 mmol/L   Glucose, Bld 104 (H) 70 - 99 mg/dL   BUN 7 6 - 20 mg/dL   Creatinine, Ser 6.29 0.44 - 1.00 mg/dL   Calcium 9.2 8.9 - 52.8 mg/dL   Total Protein 8.1 6.5 - 8.1 g/dL   Albumin 4.1 3.5 - 5.0 g/dL   AST 24 15 - 41 U/L   ALT 18 0 - 44 U/L   Alkaline Phosphatase 49 38 - 126 U/L   Total Bilirubin 0.3 0.3 - 1.2 mg/dL   GFR, Estimated >41 >32 mL/min   Anion gap 10 5 - 15  Magnesium  Result Value Ref Range  Magnesium 2.0 1.7 - 2.4 mg/dL   CT PELVIS W CONTRAST  Result Date: 03/29/2023 CLINICAL DATA:  Pelvic pressure and pain with vaginal spotting. IUD in place. EXAM: CT PELVIS WITH CONTRAST TECHNIQUE: Multidetector CT imaging of the pelvis was performed using the standard protocol following the bolus administration of intravenous contrast. RADIATION DOSE REDUCTION: This exam was performed according to the departmental dose-optimization program which includes automated exposure control, adjustment of the mA and/or kV according to patient size and/or use of iterative reconstruction technique. CONTRAST:  OMNIPAQUE IOHEXOL 300 MG/ML  SOLN COMPARISON:  CT abdomen and pelvis with oral contrast only 10/28/2020, CT abdomen and pelvis with IV contrast 05/31/2017. FINDINGS: Urinary Tract: The pelvic ureters are normal caliber. No ureteral stones are seen. The bladder wall and lumen are unremarkable. Delayed imaging with contrast in the bladder demonstrates no focal abnormality. There is no cystocele. Bowel:  No dilatation or wall thickening including of the appendix.  Vascular/Lymphatic: No atherosclerotic plaques are seen. Since the prior studies there is increased pelvic venous congestion on the left-greater-than-right, and on the left this extends to the outer wall of the uterus. No other significant vascular findings. There are scattered nonspecific bilateral inguinal lymph nodes up to 1 cm in short axis, increased in prominence in the interval. No adenopathy in the pelvic sidewalls. Reproductive: The uterus is anteverted, mildly prominent as before. The ovaries are not enlarged. IUD is again noted in the expected position within the uterine cavity. The vaginal cuff appears normally outlined. Other: Small umbilical fat hernia. No incarcerated hernia. No inguinal hernia. No free hemorrhage, free fluid or free air. Musculoskeletal: Moderate facet hypertrophy both of lowest 2 lumbar levels. No acute or other significant osseous findings. IMPRESSION: 1. No acute pelvic CT findings. 2. Increased pelvic venous congestion on the left-greater-than-right, on the left extending to the outer wall of the uterus. This can be seen with pelvic congestion syndrome. 3. IUD in the expected position within the uterine cavity. 4. Nonspecific bilateral inguinal lymph nodes up to 1 cm in short axis, increased in prominence in the interval. 5. Small umbilical fat hernia. 6. Lower lumbar facet hypertrophy. Electronically Signed   By: Almira Bar M.D.   On: 03/29/2023 02:16      Dione Booze, MD 03/29/23 [phone]

## 2023-03-29 NOTE — Discharge Instructions (Signed)
You were seen for your pelvic pain in the emergency department.   At home, please take the potassium we have prescribed you for your low potassium.    Check your MyChart online for the results of any tests that had not resulted by the time you left the emergency department.   Follow-up with your OB/GYN as soon as possible.  We are concerned that you may be having pelvic organ prolapse syndrome.  Return immediately to the emergency department if you experience any of the following: Worsening pain, or any other concerning symptoms.    Thank you for visiting our Emergency Department. It was a pleasure taking care of you today.

## 2023-03-30 ENCOUNTER — Telehealth: Payer: Self-pay | Admitting: Internal Medicine

## 2023-03-30 NOTE — Telephone Encounter (Signed)
Message sent to referral coordinator.  - [phone]

## 2023-03-30 NOTE — Telephone Encounter (Signed)
Copied from CRM [id number]. Topic: General - Inquiry >> Mar 30, 2023  9:07 AM De Blanch wrote: Reason for NUU:[id number] Dermatology is calling stated they received a referral they are limited as to as to what they are accepting. Pt was referred for Cystic Acne. They will be declining referral confirmed fax to send confirmation.   Please advise.

## 2023-04-11 ENCOUNTER — Ambulatory Visit (INDEPENDENT_AMBULATORY_CARE_PROVIDER_SITE_OTHER): Payer: Medicare Other | Admitting: Psychology

## 2023-04-11 DIAGNOSIS — F4310 Post-traumatic stress disorder, unspecified: Secondary | ICD-10-CM | POA: Diagnosis not present

## 2023-04-11 DIAGNOSIS — F4323 Adjustment disorder with mixed anxiety and depressed mood: Secondary | ICD-10-CM | POA: Diagnosis not present
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
## 2023-04-18 ENCOUNTER — Other Ambulatory Visit (HOSPITAL_COMMUNITY)
Admission: RE | Admit: 2023-04-18 | Discharge: 2023-04-18 | Disposition: A | Discharge status: Home / Self Care | Payer: Medicare Other | Source: Ambulatory Visit | Attending: Obstetrics & Gynecology | Admitting: Obstetrics & Gynecology

## 2023-04-18 ENCOUNTER — Ambulatory Visit: Payer: Medicare Other | Admitting: Obstetrics & Gynecology

## 2023-04-18 ENCOUNTER — Encounter: Payer: Self-pay | Admitting: Obstetrics & Gynecology

## 2023-04-18 VITALS — BP 136/82 | HR 89 | Ht 65.0 in | Wt 208.4 lb

## 2023-04-18 DIAGNOSIS — Z01419 Encounter for gynecological examination (general) (routine) without abnormal findings: Secondary | ICD-10-CM | POA: Diagnosis not present

## 2023-04-18 DIAGNOSIS — Z1231 Encounter for screening mammogram for malignant neoplasm of breast: Secondary | ICD-10-CM

## 2023-04-18 DIAGNOSIS — Z30432 Encounter for removal of intrauterine contraceptive device: Secondary | ICD-10-CM | POA: Diagnosis not present

## 2023-04-18 DIAGNOSIS — Z1151 Encounter for screening for human papillomavirus (HPV): Secondary | ICD-10-CM | POA: Insufficient documentation

## 2023-04-18 DIAGNOSIS — R102 Pelvic and perineal pain: Secondary | ICD-10-CM

## 2023-04-18 DIAGNOSIS — Z975 Presence of (intrauterine) contraceptive device: Secondary | ICD-10-CM

## 2023-04-18 NOTE — Progress Notes (Signed)
WELL-WOMAN EXAMINATION Patient name: Joyce [name] MRN [id number]  Date of birth: 10/11/1975 Chief Complaint:   IUD Removal (Pain in vagina)  History of Present Illness:   Joyce [name] is a 47 y.o. [id number] female being seen today for a routine well-woman exam.   Last seen 2021.  Paragard placed 03/2020  Pelvic pain: Notes pain in her vagina- started this year.  States the pain will last for about an hour then go away.  Dates the pain is similar to pelvic pressure of delivering a baby.  The pain typically resolves on its own.  She has not taken any medication for the pain.  She denies vaginal discharge, itching or irritation.  Denies pelvic or abdominal pain.  CT had been completed that showed no abnormalities and IUD in proper location  Menses are regular each month- notes last for about 5 days, middle few days moderate bleeding changing pad 2-3x per day.     Denies intermenstrual bleeding.  She is not currently sexually active  She wishes to consider IUD removal as she is concerned that this is contributing to her pelvic pain.     Patient's last menstrual period was 03/24/2023 (approximate).  The current method of family planning is IUD.   Last pap due today.  Last mammogram: ordered. Last colonoscopy: advised follow up     04/18/2023    3:11 PM 03/01/2023    4:17 PM 01/06/2023    3:49 PM 12/14/2022    2:55 PM 08/15/2022    4:17 PM  Depression screen PHQ 2/9  Decreased Interest 3 1 1 1 1   Down, Depressed, Hopeless 3 1 1 1 2   PHQ - 2 Score 6 2 2 2 3   Altered sleeping 2 1 3  3   Tired, decreased energy 3 1 3  2   Change in appetite 2 3 3   0  Feeling bad or failure about yourself  3 1 2   0  Trouble concentrating 3 1 2  3   Moving slowly or fidgety/restless 1 0 0  0  Suicidal thoughts 0 0 0  0  PHQ-9 Score 20 9 15  11   Difficult doing work/chores  Somewhat difficult Somewhat difficult  Very difficult      Review of Systems:   Pertinent items are noted in  HPI Denies any headaches, blurred vision, fatigue, shortness of breath, chest pain, abdominal pain, bowel movements, urination, or intercourse unless otherwise stated above.  Pertinent History Reviewed:  Reviewed past medical,surgical, social and family history.  Reviewed problem list, medications and allergies. Physical Assessment:   Vitals:   04/18/23 1510 04/18/23 1521  BP: (!) 144/82 136/82  Pulse: 82 89  Weight: 208 lb 6.4 oz (94.5 kg)   Height: 5\' 5"  (1.651 m)   Body mass index is 34.68 kg/m.        Physical Examination:   General appearance - well appearing, and in no distress  Mental status - alert, oriented to person, place, and time  Psych:  She has a normal mood and affect  Skin - warm and dry, normal color, no suspicious lesions noted  Chest - effort normal, all lung fields clear to auscultation bilaterally  Heart - normal rate and regular rhythm  Neck:  midline trachea, no thyromegaly or nodules  Breasts - breasts appear normal, no suspicious masses, no skin or nipple changes or  axillary nodes  Abdomen - soft, nontender, nondistended, no masses or organomegaly  Pelvic - VULVA: normal appearing vulva with  no masses, tenderness or lesions  VAGINA: normal appearing vagina with normal color and discharge, no lesions  CERVIX: normal appearing cervix without discharge or lesions, no CMT  Thin prep pap is done with HR HPV cotesting  UTERUS: uterus is felt to be normal size, shape, consistency and nontender   ADNEXA: No adnexal masses or tenderness noted.  Extremities:  No swelling or varicosities noted  Chaperone:  Dr. Elberta Fortis      IUD REMOVAL   Time out was performed.  A sterile speculum was placed in the vagina.  The cervix was visualized, and the strings were not visible- hook used to locate the strings.  They were then grasped and the Paragard  IUD was easily removed intact without complications. The patient tolerated the procedure well.     Assessment &  Plan:  1) Well-Woman Exam -pap collected, reviewed ASCCP guidelines -mammogram ordered -STI screening to be completed  2) IUD removal -device removed without issues  3) Pelvic pain -no abnormalities noted -plan to monitor symptoms to see if she notes resolution s/p IUD removal -suspect may be musculoskeletal in nature reviewed conservative management including pelvic floor strengthening exercises -Patient to follow   Orders Placed This Encounter  Procedures   MM 3D SCREENING MAMMOGRAM BILATERAL BREAST    Meds: No orders of the defined types were placed in this encounter.   Follow-up: Return in about 1 year (around 04/17/2024) for Annual.   Myna Hidalgo, DO Attending Obstetrician & Gynecologist, Faculty Practice Center for [facility], [facility]

## 2023-04-20 ENCOUNTER — Ambulatory Visit (INDEPENDENT_AMBULATORY_CARE_PROVIDER_SITE_OTHER): Payer: Medicare Other | Admitting: Psychology

## 2023-04-20 DIAGNOSIS — F4323 Adjustment disorder with mixed anxiety and depressed mood: Secondary | ICD-10-CM | POA: Diagnosis not present

## 2023-04-20 DIAGNOSIS — F4310 Post-traumatic stress disorder, unspecified: Secondary | ICD-10-CM

## 2023-04-21 LAB — CYTOLOGY - PAP
Chlamydia: NEGATIVE
Comment: NEGATIVE
Comment: NEGATIVE
Comment: NEGATIVE
Comment: NORMAL
Diagnosis: NEGATIVE
High risk HPV: NEGATIVE
Neisseria Gonorrhea: NEGATIVE
Trichomonas: NEGATIVE

## 2023-04-21 NOTE — Progress Notes (Signed)
[facility] Counselor/Therapist Progress Note  Patient ID: Joyce [name], MRN: [id number],    Date: 04/20/2023  Time Spent: 60 minutes Time in:  5:00  Time out: 6:00  Treatment Type: Individual Therapy  Reported Symptoms: The patient attended an individual therapy session in the office today.  The patient reports sadness and anxiety as her primary symptoms.  She is having trouble sleeping and also feeling lonely.  Mental Status Exam: Appearance:  Casual     Behavior: Appropriate  Motor: Normal  Speech/Language:  Normal Rate  Affect: sad  Mood: sad  Thought process: normal  Thought content:   WNL  Sensory/Perceptual disturbances:   WNL  Orientation: oriented to person, place, time/date, and situation  Attention: Good  Concentration: Good  Memory: WNL  Fund of knowledge:  Fair  Insight:   Good  Judgment:  Poor  Impulse Control: Poor   Risk Assessment: Danger to Self:  No Self-injurious Behavior: No Danger to Others: No Duty to Warn:no Physical Aggression / Violence:No  Access to Firearms a concern: No  Gang Involvement:No   Subjective: The patient came into the office for an individual therapy session today.  She reports that she is wanting to get herself more focused and she struggles with being sad about not being in the relationship that she was in.  She has left the situation and does not want to go back to it but also may be hurting herself by not accepting some assistance from him since she has a child by him.  We talked about this and she is still not ready to do anything different and then she is well willing to do right now as far as asking him to be accountable for his child.  The patient is aware that she has a history of getting in relationships too quickly and expecting them to love her.  I explained to her about codependency and boundaries and I recommended that she read a book about boundaries.  I gave her some positive feedback for pursuing  her GED and also putting some work into taking care of herself and doing what she needs to do for herself without seeking out a relationship that is going to be toxic for her.  We will continue to work on building her up so that she can function without a relationship.  Interventions: Cognitive Behavioral Therapy, Assertiveness/Communication, Psychologist, occupational, Insight-Oriented, and Interpersonal  Diagnosis:Adjustment disorder with mixed anxiety and depressed mood  PTSD (post-traumatic stress disorder)  Plan: Client Abilities/Strengths  Intelligent, insightful  Client Treatment Preferences  Outpatient Individual therapy  Client Statement of Needs  "I need some help with my anxiety and depression.  I also need help in learning how to be alone." Treatment Level  Outpatient Individual therapy  Symptoms  Excessive and/or unrealistic worry that is difficult to control occurring more days than not for at least 6  months about a number of events or activities.: No Description Entered (Status: maintained). Has been  exposed to a traumatic event involving actual or perceived threat of death or serious injury.: No  Description Entered (Status: maintained). Hypervigilance (e.g., feeling constantly on edge,  experiencing concentration difficulties, having trouble falling or staying asleep, exhibiting a general  state of irritability).: No Description Entered (Status: maintained). Impairment in social, occupational,  or other areas of functioning.: No Description Entered (Status: maintained).  Problems Addressed  Posttraumatic Stress Disorder (PTSD), Anxiety  Goals 1. Eliminate or reduce the negative impact trauma related symptoms have  on social, occupational, and family functioning. Objective Participate in Eye Movement Desensitization and Reprocessing (EMDR) to reduce emotional distress  related to traumatic thoughts, feelings, and images. Target Date: 04/19/2024 Frequency:  Weekly  Progress: 0 Modality: individual Related Interventions 1. Utilize Eye Movement Desensitization and Reprocessing (EMDR) to reduce the client's  emotional reactivity to the traumatic event and reduce PTSD symptoms. 2. Stabilize anxiety level while increasing ability to function on a daily  basis. Objective Learn and implement problem-solving strategies for realistically addressing worries. Target Date: 04/19/2024 Frequency: Weekly Progress: 0 Modality: individual Related Interventions 1. Teach the client problem-solving strategies involving specifically defining a problem,  generating options for addressing it, evaluating the pros and cons of each option, selecting and  implementing an optional action, and reevaluating and refining the action (or assign "Applying  Problem-Solving to Interpersonal Conflict" in the Adult Psychotherapy Homework Planner by  Stephannie Li). Diagnosis F43.23  Adjustment Disorder with anxiety and depression F43.10 (Posttraumatic stress disorder) - Open - [Signifier: n/a] Posttraumatic Stress  Disorder  Conditions For Discharge Achievement of treatment goals and objectives   Joyce [name] G Joyce Gamero, LCSW                  Joyce [name] G Joyce Mainwaring, LCSW

## 2023-04-26 ENCOUNTER — Ambulatory Visit: Payer: Medicare Other | Admitting: Adult Health

## 2023-05-02 ENCOUNTER — Encounter: Payer: Self-pay | Admitting: Adult Health

## 2023-05-02 ENCOUNTER — Ambulatory Visit (INDEPENDENT_AMBULATORY_CARE_PROVIDER_SITE_OTHER): Payer: Medicare Other | Admitting: Adult Health

## 2023-05-02 ENCOUNTER — Ambulatory Visit: Payer: Medicare Other | Admitting: Psychology

## 2023-05-02 ENCOUNTER — Telehealth: Payer: Self-pay

## 2023-05-02 VITALS — BP 149/81 | HR 83 | Ht 65.0 in | Wt 203.0 lb

## 2023-05-02 DIAGNOSIS — F4323 Adjustment disorder with mixed anxiety and depressed mood: Secondary | ICD-10-CM

## 2023-05-02 DIAGNOSIS — G8929 Other chronic pain: Secondary | ICD-10-CM

## 2023-05-02 DIAGNOSIS — M5441 Lumbago with sciatica, right side: Secondary | ICD-10-CM | POA: Diagnosis not present

## 2023-05-02 DIAGNOSIS — I6381 Other cerebral infarction due to occlusion or stenosis of small artery: Secondary | ICD-10-CM

## 2023-05-02 DIAGNOSIS — G43109 Migraine with aura, not intractable, without status migrainosus: Secondary | ICD-10-CM

## 2023-05-02 DIAGNOSIS — F4310 Post-traumatic stress disorder, unspecified: Secondary | ICD-10-CM

## 2023-05-02 MED ORDER — TOPIRAMATE ER 150 MG PO SPRINKLE CAP24: 150.0000 mg | EXTENDED_RELEASE_CAPSULE | Freq: Every evening | ORAL | 5 refills | Status: DC

## 2023-05-02 NOTE — Progress Notes (Signed)
[facility] [address] [place]. [place] [address] [phone]       OFFICE FOLLOW-UP VISIT NOTE  Joyce [name] Date of Birth:  [birth year] Medical Record Number:  [id number]    Primary neurologist: Dr. Pearlean Brownie Reason for visit: hx of stroke, migraines, lumbar radiculopathy   Chief Complaint  Patient presents with   Follow-up    Patient in room #3 and alone. Patient states she still has some pain in her right knee, but she been doing great. Patient states she still has some shakes in her left leg.      HPI:   Update 05/02/2023 JM: Returns for follow-up visit.  Stable from stroke standpoint, continued mild LLE weakness and occasional numbness but overall stable.  Ambulates without AD, no recent falls. Can occasionally have tremor in left arm and leg with muscle activation but able to stop tremor by holding extremity.  Denies new stroke/TIA symptoms.  Remains on aspirin and Crestor.  Blood pressure elevated today, routinely monitored at home and reports BP can fluctuate since her stroke although per epic review from recent follow up visits, BP has been satisfactory.  Routinely follows with PCP for stroke risk factor management.  Currently experiencing about 1-2 migraines per week, notes some improvement with increasing Qudexy to 100mg  nightly, tolerating without side effects. Will use Tylenol as needed with benefit, denies overuse.  Continues to have right sided sciatica, Dr. Riley Kill switched her from pregabalin back to gabapentin end of October which she feels works better, takes the edge off but symptoms still persistent.  She was referred to neurosurgery after MRI lumbar spine showed L4-5 severe facet hypertrophy and moderate bilateral foraminal narrowing resulting in possible encroachment on exiting L5 nerve root bilaterally. She reports she was never called to neurosurgery evaluation.      History provided for reference purposes only Update 01/03/2023 Dr.  Pearlean Brownie; Patient returns for follow-up after last visit 2 and half years ago.  She states she is doing well from the stroke standpoint without recurrent stroke or TIA symptoms.  She had some left-sided weakness and clumsiness which appears to have improved.  She remains on aspirin which is tolerating well without bruising or bleeding.  She states her blood pressure is under good control.  She is tolerating Crestor well without muscle aches and pains.  She has a new complaint of right hip and leg pain.  This has been going on for the last year and a half.  The pain is intermittent described as sharp shooting going down the back of her buttock into the lateral aspect of the right thigh up to the knee.  She has seen Dr. Hermelinda Medicus from rehab close prescribed some Lyrica and she has been taking 50 mg 3 times daily for the last couple of months without much relief.  She has not tried higher dose yet.  She has not had any recent back x-rays or MRI done.  She continues to have migraines which she sees still not well-controlled.  They occur 2 or 3 times a week.  She takes Topamax XR 50 mg at night and is tolerating it well without side effects.  She has not tried a higher dose yet.  She was seen in the emergency room on 09/18/2022 for headache and left-sided numbness.  MRI scan of the brain was obtained which showed no acute abnormality.  Echocardiogram showed ejection fraction of 55 to 60%.  Hemoglobin A1c was 5.5 and LDL cholesterol was 68 mg percent.  She was treated with a migraine cocktail and thought to have her typical migraine symptoms resolved.  Initial visit 08/06/2020 Dr. Pearlean Brownie Joyce [name] is a 47 year old pleasant African-American lady seen today for initial office consultation visit for stroke.  History is obtained from the patient, review of electronic medical records and I personally reviewed pertinent available imaging films in PACS.  She has past medical history of hypertension, hyperlipidemia, depression,  migraines who presented initially initially on 03/04/2020 with sudden onset of left-sided weakness and numbness.  She presented outside time window for TPA. She was seen by telemetry specialist at [place] and NIH stroke scale was 3.  MRI scan of the brain showed a small 8 mm right lateral thalamic acute lacunar infarct.  There are mild changes of small vessel disease.  MR angiogram of the brain showed no significant large vessel stenosis.  Carotid ultrasound showed no significant extracranial stenosis.  Transthoracic echo showed normal ejection fraction of 60 to 65% without cardiac source of embolism.  LDL cholesterol was 96 mg percent and hemoglobin A1c was 5.6.  ANA panel was negative antiphospholipid antibodies were negative.  Homocysteine level was normal.  RPR was negative.  ESR was normal.  Patient was started on aspirin Plavix for 3 weeks and subsequently switched to aspirin alone.  She did well with the regaining strength on the left side but has residual paresthesias still.  She is currently doing outpatient physical occupational therapy.  She returned to the ER on 05/19/2020 with headache as well as back pain.  Repeat MRI of the brain showed no acute abnormality.  Patient states she remains on aspirin she is tolerating well without bleeding or bruising.  Blood pressures well controlled today it is 133/81.  She was on hormonal injections 3 times a week at the time of the stroke since then her gynecologist/changes to hormonal implant.  Her migraines are much improved and now occur around once a month or so.  She is on low-dose Topamax 25 mg daily for migraine prevention and tolerating it well without side effects.  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   Hypertension    Lumbar radiculitis 06/23/2021   Migraine    PTSD (post-traumatic stress disorder) 05/01/2017   Round ligament pain 11/12/2014    Social History:  Social History   Socioeconomic History   Marital status: Single    Spouse name: Estate manager/land agent   Number of children: 8   Years of education: 9   Highest education level: Not on file  Occupational History   Occupation: unemployed    Comment: disabled  Tobacco Use   Smoking status: Never   Smokeless tobacco: Never  Vaping Use   Vaping status: Never Used  Substance and Sexual Activity   Alcohol use: Never   Drug use: No   Sexual activity: Not Currently    Birth control/protection: I.U.D.    Comment: followed by GYN  Other Topics Concern   Not on file  Social History Narrative   Lives with fiance and kids   Right Handed  Drinks >12 cans of soda in caffeine   Social Determinants of Health   Financial Resource Strain: High Risk (04/18/2023)   Overall Financial Resource Strain (CARDIA)    Difficulty of Paying Living Expenses: Very hard  Food Insecurity: Food Insecurity Present (04/18/2023)   Hunger Vital Sign    Worried About Running Out of Food in the Last Year: Sometimes true    Ran Out of Food in the Last Year: Never true  Transportation Needs: No Transportation Needs (04/18/2023)   PRAPARE - Administrator, Civil Service (Medical): No    Lack of Transportation (Non-Medical): No  Physical Activity: Sufficiently Active (04/18/2023)   Exercise Vital Sign    Days of Exercise per Week: 5 days    Minutes of Exercise per Session: 40 min  Stress: Stress Concern Present (04/18/2023)   [employer] of Occupational Health - Occupational Stress Questionnaire    Feeling of Stress : Very much  Social Connections: Unknown (04/18/2023)   Social Connection and  Isolation Panel [NHANES]    Frequency of Communication with Friends and Family: Never    Frequency of Social Gatherings with Friends and Family: Never    Attends Religious Services: More than 4 times per year    Active Member of [employer] or Organizations: No    Attends Banker Meetings: Never    Marital Status: Patient declined  Catering manager Violence: Not At Risk (04/18/2023)   Humiliation, Afraid, Rape, and Kick questionnaire    Fear of Current or Ex-Partner: No    Emotionally Abused: No    Physically Abused: No    Sexually Abused: No    Medications:   Current Outpatient Medications on File Prior to Visit  Medication Sig Dispense Refill   amLODipine (NORVASC) 10 MG tablet Take 1 tablet (10 mg total) by mouth daily. 90 tablet 3   clindamycin (CLEOCIN T) 1 % external solution Apply topically 2 (two) times daily. 30 mL 0   fluticasone (FLONASE) 50 MCG/ACT nasal spray Use 2 sprays in each nostril BID for a week. After 1 week, decrease to 1 spray in each nostril BID as needed for congestion/allergies. 16 g 6   gabapentin (NEURONTIN) 600 MG tablet Take 1 tablet (600 mg total) by mouth 3 (three) times daily. 90 tablet 3   ibuprofen (ADVIL) 600 MG tablet Take 1 tablet (600 mg total) by mouth every 6 (six) hours as needed. 30 tablet 0   PARAGARD INTRAUTERINE COPPER IU by Intrauterine route.     rosuvastatin (CRESTOR) 10 MG tablet Take 1 tablet (10 mg total) by mouth daily. 90 tablet 3   tiZANidine (ZANAFLEX) 4 MG tablet Take 4 mg by mouth 2 (two) times daily.     topiramate ER (QUDEXY XR) 50 MG CS24 sprinkle capsule Take 2 capsules (100 mg total) by mouth at bedtime. 120 capsule 0   traZODone (DESYREL) 100 MG tablet Take 1 tablet (100 mg total) by mouth at bedtime. 90 tablet 5   No current facility-administered medications on file prior to visit.    Allergies:  No Known Allergies  Physical Exam Today's Vitals   05/02/23 1505  BP: (!) 149/81  Pulse: 83  Weight: 203 lb  (92.1 kg)  Height: 5\' 5"  (1.651 m)   Body mass index is 33.78 kg/m.  General: well developed, well nourished, very pleasant middle-aged African-American female, seated, in no evident distress  Neurologic Exam Mental Status: Awake and fully alert. Oriented to place and time. Recent and remote  memory intact. Attention span, concentration and fund of knowledge appropriate. Mood and affect appropriate.  Cranial Nerves: Pupils equal, briskly reactive to light. Extraocular movements full without nystagmus. Visual fields full to confrontation. Hearing intact. Facial sensation intact. Face, tongue, palate moves normally and symmetrically.  Motor: Normal bulk and tone. Normal strength in all tested extremity muscles.  Diminished fine finger movements on the left.  Orbits right over left upper extremity. Sensory.:  Subjective diminished upper and lower extremity sensation.  Coordination: Rapid alternating movements normal on the right. Finger-to-nose and heel-to-shin performed accurately bilaterally. Gait and Station: Arises from chair with  difficulty.  Gait is antalgic and mild favoring of RLE due to pain.  Ambulates without AD. Reflexes: 1+ and symmetric. Toes downgoing.        ASSESSMENT: 47 year old African-American lady with right thalamic lacunar infarct in October 2021 secondary to small vessel disease with residual poststroke paresthesias and mild left-sided weakness.  Vascular risk factors of hypertension, hyperlipidemia, and obesity.  She also has longstanding history of migraines which have improved some on Qudexy but still occurring 1-2 times per week.  Continue lumbar radiculopathy with right sided sciatica now over the past 2 years.     PLAN:  1.  Migraine with aura  -Increase Qudexy from 100 mg to 150 mg nightly  -Continue Tylenol as needed, can consider use of Nurtec or Ubrelvy for rescue in the future if needed  2. Hx of stroke  -Continue aspirin and Crestor for secondary  stroke prevention managed/prescribed by PCP  -Continue close PCP follow-up for aggressive stroke risk factor management  3.  Lumbar radiculopathy with right-sided sciatica  -will f/u regarding referral to neurosurgery previously placed by Dr. Pearlean Brownie  -Continue to follow with PMR Dr. Riley Kill for pain management currently on gabapentin    Follow-up in 6 months or call earlier if needed     I spent 30 minutes of face-to-face and non-face-to-face time with patient.  This included previsit chart review, lab review, study review, order entry, electronic health record documentation, patient education and discussion regarding above diagnoses and treatment plan and answered all other questions to patient's satisfaction  Ihor Austin, [facility]  [facility] [address] [place], [place] [address]  Phone [phone] Fax [phone] Note: This document was prepared with digital dictation and possible smart phrase technology. Any transcriptional errors that result from this process are unintentional.

## 2023-05-02 NOTE — Patient Instructions (Signed)
Increase Qudexy to 150mg  nightly for further migraine control   Will follow up regarding referral that was previously sent to [facility] - if you do not receive a call from them by end of next week, please let me know!   Continue aspirin 81 mg daily  and Crestor  for secondary stroke prevention  Continue to follow up with PCP regarding blood pressure and cholesterol management  Maintain strict control of hypertension with blood pressure goal below 130/90 and cholesterol with LDL cholesterol (bad cholesterol) goal below 70 mg/dL.   Signs of a Stroke? Follow the BEFAST method:  Balance Watch for a sudden loss of balance, trouble with coordination or vertigo Eyes Is there a sudden loss of vision in one or both eyes? Or double vision?  Face: Ask the person to smile. Does one side of the face droop or is it numb?  Arms: Ask the person to raise both arms. Does one arm drift downward? Is there weakness or numbness of a leg? Speech: Ask the person to repeat a simple phrase. Does the speech sound slurred/strange? Is the person confused ? Time: If you observe any of these signs, call 911.    Followup in the future with me in 6 months or call earlier if needed       Thank you for coming to see [place] at [facility]. I hope we have been able to provide you high quality care today.  You may receive a patient satisfaction survey over the next few weeks. We would appreciate your feedback and comments so that we may continue to improve ourselves and the health of our patients.

## 2023-05-02 NOTE — Telephone Encounter (Signed)
Called and spoke to patient about CC chart from Ihor Austin, NP  P [facility] Calls Can someone follow up regarding prior referral placed to [facility] - patient reports she was never contacted to schedule.  I called [facility] and Morrie Sheldon reported that she was originally scheduled in September for the 20th with a phone note from September 12th and no showed the appointment, she stated the patient can call and make one more appointment but if she misses this one the office cannot accept her anymore.  I called the patient and relayed this information to her and she still reported she was never contacted or knew about the appointment. I gave her the number in the referral note to call and schedule and she states she will call and get an appointment. Nothing further was needed at this time and the message was relayed to [facility] but will send as an FYI to her.

## 2023-05-03 NOTE — Progress Notes (Signed)
[facility] Counselor/Therapist Progress Note  Patient ID: Joyce [name], MRN: [id number],    Date: 05/02/2023  Time Spent: 60 minutes Time in:  5:00  Time out: 6:00  Treatment Type: Individual Therapy  Reported Symptoms: The patient attended an individual therapy session in the office today.  The patient reports sadness and anxiety as her primary symptoms.  She is having trouble sleeping and also feeling lonely.  Mental Status Exam: Appearance:  Casual     Behavior: Appropriate  Motor: Normal  Speech/Language:  Normal Rate  Affect: sad  Mood: sad  Thought process: normal  Thought content:   WNL  Sensory/Perceptual disturbances:   WNL  Orientation: oriented to person, place, time/date, and situation  Attention: Good  Concentration: Good  Memory: WNL  Fund of knowledge:  Fair  Insight:   Good  Judgment:  Poor  Impulse Control: Poor   Risk Assessment: Danger to Self:  No Self-injurious Behavior: No Danger to Others: No Duty to Warn:no Physical Aggression / Violence:No  Access to Firearms a concern: No  Gang Involvement:No   Subjective: The patient came into the office for an individual therapy session today.  The patient reports that she had a conversation with her ex this week.  The patient presents as pleasant and cooperative today.  We talked about the conversation that she had and it seems that he continues to think that the relationship did not work because of her.  I pointed this out to her.  He did open up more to her about what he was not happy about but he did not accept any responsibility  for the problems they had.  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  It seems that his mother lived with them and  the patient would run to her and talk about their business with her.  I did confront the patient on doing  this because that may have contributed to a lot of their problems.  We talked about her recommending that he go to counseling and then possibly them having counseling together as a couple if they chose to get back together.  I did tell her that my boundary would probably be whether he actually did anything different to try to work on himself or take responsibility for some of the problems.  The patient was in agreement with that. Interventions: Cognitive Behavioral Therapy, Assertiveness/Communication, Psychologist, occupational, Insight-Oriented, and Interpersonal  Diagnosis:Adjustment disorder with mixed anxiety and depressed mood  PTSD (post-traumatic stress disorder)  Plan: Client Abilities/Strengths  Intelligent, insightful  Client Treatment Preferences  Outpatient Individual therapy  Client Statement of Needs  "I need some help with my anxiety and depression.  I also need help in learning how to be alone." Treatment Level  Outpatient Individual therapy  Symptoms  Excessive and/or unrealistic worry that is difficult to control occurring more days than not for at least 6  months about a number of events or activities.: No Description Entered (Status: maintained). Has been  exposed to a traumatic event involving actual or perceived threat of death or serious injury.: No  Description Entered (Status: maintained). Hypervigilance (e.g., feeling constantly on edge,  experiencing concentration difficulties, having trouble falling or staying asleep, exhibiting a general  state of irritability).: No Description Entered (Status: maintained). Impairment in social, occupational,  or other areas of functioning.: No  Description Entered (Status: maintained).  Problems Addressed  Posttraumatic Stress Disorder (PTSD), Anxiety  Goals 1. Eliminate or reduce the negative impact trauma related symptoms have  on social, occupational, and family functioning. Objective Participate in Eye Movement  Desensitization and Reprocessing (EMDR) to reduce emotional distress  related to traumatic thoughts, feelings, and images. Target Date: 04/19/2024 Frequency: Weekly  Progress: 0 Modality: individual Related Interventions 1. Utilize Eye Movement Desensitization and Reprocessing (EMDR) to reduce the client's  emotional reactivity to the traumatic event and reduce PTSD symptoms. 2. Stabilize anxiety level while increasing ability to function on a daily  basis. Objective Learn and implement problem-solving strategies for realistically addressing worries. Target Date: 04/19/2024 Frequency: Weekly Progress: 0 Modality: individual Related Interventions 1. Teach the client problem-solving strategies involving specifically defining a problem,  generating options for addressing it, evaluating the pros and cons of each option, selecting and  implementing an optional action, and reevaluating and refining the action (or assign "Applying  Problem-Solving to Interpersonal Conflict" in the Adult Psychotherapy Homework Planner by  Stephannie Li). Diagnosis F43.23  Adjustment Disorder with anxiety and depression F43.10 (Posttraumatic stress disorder) - Open - [Signifier: n/a] Posttraumatic Stress  Disorder  Conditions For Discharge Achievement of treatment goals and objectives   Beckey Polkowski G Nikhita Mentzel, LCSW

## 2023-05-11 ENCOUNTER — Ambulatory Visit: Payer: Medicare Other | Admitting: Psychology

## 2023-05-11 DIAGNOSIS — F4323 Adjustment disorder with mixed anxiety and depressed mood: Secondary | ICD-10-CM | POA: Diagnosis not present

## 2023-05-11 DIAGNOSIS — F4310 Post-traumatic stress disorder, unspecified: Secondary | ICD-10-CM | POA: Diagnosis not present

## 2023-05-11 NOTE — Progress Notes (Signed)
[facility] Counselor/Therapist Progress Note  Patient ID: Joyce [name], MRN: [id number],    Date: 05/11/2023  Time Spent: 45 minutes Time in:  4:03  Time out: 4:48  Treatment Type: Individual Therapy  Reported Symptoms: The patient attended an individual therapy session in the office today.  The patient reports sadness and anxiety as her primary symptoms.  She is having trouble sleeping and also feeling lonely.  Mental Status Exam: Appearance:  Casual     Behavior: Appropriate  Motor: Normal  Speech/Language:  Normal Rate  Affect: blunted  Mood: anxious  Thought process: normal  Thought content:   WNL  Sensory/Perceptual disturbances:   WNL  Orientation: oriented to person, place, time/date, and situation  Attention: Good  Concentration: Good  Memory: WNL  Fund of knowledge:  Fair  Insight:   Good  Judgment:  Poor  Impulse Control: Poor   Risk Assessment: Danger to Self:  No Self-injurious Behavior: No Danger to Others: No Duty to Warn:no Physical Aggression / Violence:No  Access to Firearms a concern: No  Gang Involvement:No   Subjective: The patient had an individual therapy session via video visit today.  The patient was not at work alone and the therapist was in the office.  The patient consented for the session to be on caregility and is aware of the limitations of telehealth.  The patient reports that she and her former boyfriend have been talking and it seems that they talked about her having unrealistic expectations of what he could do for her from an emotional standpoint.  I confirmed that she was being unrealistic in her expectations that he would give her constant validation when in a relationship that is a consistent relationship.  We talked about the need for her to work with herself on making those expectations more realistic.  I also explained to her that I thought she did not have realistic expectations because she never had a real role  model in what relationships are supposed to look like.  We were not able to get very far today because she ended up having to log out of the session a little early.  We will continue to look at her expectations at the next session.   Interventions: Cognitive Behavioral Therapy, Assertiveness/Communication, Psychologist, occupational, Insight-Oriented, and Interpersonal  Diagnosis:Adjustment disorder with mixed anxiety and depressed mood  PTSD (post-traumatic stress disorder)  Plan: Client Abilities/Strengths  Intelligent, insightful  Client Treatment Preferences  Outpatient Individual therapy  Client Statement of Needs  "I need some help with my anxiety and depression.  I also need help in learning how to be alone." Treatment Level  Outpatient Individual therapy  Symptoms  Excessive and/or unrealistic worry that is difficult to control occurring more days than not for at least 6  months about a number of events or activities.: No Description Entered (Status: maintained). Has been  exposed to a traumatic event involving actual or perceived threat of death or serious injury.: No  Description Entered (Status: maintained). Hypervigilance (e.g., feeling constantly on edge,  experiencing concentration difficulties, having trouble falling or staying asleep, exhibiting a general  state of irritability).: No Description Entered (Status: maintained). Impairment in social, occupational,  or other areas of functioning.: No Description Entered (Status: maintained).  Problems Addressed  Posttraumatic Stress Disorder (PTSD), Anxiety  Goals 1. Eliminate or reduce the negative impact trauma related symptoms have  on social, occupational, and family functioning. Objective Participate in Eye Movement Desensitization and Reprocessing (EMDR) to reduce emotional distress  related to traumatic thoughts, feelings, and images. Target Date: 04/19/2024 Frequency: Weekly  Progress: 0 Modality: individual Related  Interventions 1. Utilize Eye Movement Desensitization and Reprocessing (EMDR) to reduce the client's  emotional reactivity to the traumatic event and reduce PTSD symptoms. 2. Stabilize anxiety level while increasing ability to function on a daily  basis. Objective Learn and implement problem-solving strategies for realistically addressing worries. Target Date: 04/19/2024 Frequency: Weekly Progress: 0 Modality: individual Related Interventions 1. Teach the client problem-solving strategies involving specifically defining a problem,  generating options for addressing it, evaluating the pros and cons of each option, selecting and  implementing an optional action, and reevaluating and refining the action (or assign "Applying  Problem-Solving to Interpersonal Conflict" in the Adult Psychotherapy Homework Planner by  Stephannie Li). Diagnosis F43.23  Adjustment Disorder with anxiety and depression F43.10 (Posttraumatic stress disorder) - Open - [Signifier: n/a] Posttraumatic Stress  Disorder  Conditions For Discharge Achievement of treatment goals and objectives   Nettie Cromwell G Karon Cotterill, LCSW

## 2023-05-18 ENCOUNTER — Ambulatory Visit: Payer: Medicare Other | Admitting: Psychology

## 2023-05-18 DIAGNOSIS — F4310 Post-traumatic stress disorder, unspecified: Secondary | ICD-10-CM

## 2023-05-18 DIAGNOSIS — F4323 Adjustment disorder with mixed anxiety and depressed mood: Secondary | ICD-10-CM

## 2023-05-18 NOTE — Progress Notes (Signed)
[facility] Counselor/Therapist Progress Note  Patient ID: GIARA ZORA, MRN: [id number],    Date: 05/18/2023  Time Spent: 60 minutes Time in:  5:03  Time out: 6:03  Treatment Type: Individual Therapy  Reported Symptoms: The patient attended an individual therapy session in the office today.  The patient reports sadness and anxiety as her primary symptoms.  She is having trouble sleeping and also feeling lonely.  Mental Status Exam: Appearance:  Casual     Behavior: Appropriate  Motor: Normal  Speech/Language:  Normal Rate  Affect: blunted  Mood: pleasant  Thought process: normal  Thought content:   WNL  Sensory/Perceptual disturbances:   WNL  Orientation: oriented to person, place, time/date, and situation  Attention: Good  Concentration: Good  Memory: WNL  Fund of knowledge:  Fair  Insight:   Good  Judgment:  Poor  Impulse Control: Poor   Risk Assessment: Danger to Self:  No Self-injurious Behavior: No Danger to Others: No Duty to Warn:no Physical Aggression / Violence:No  Access to Firearms a concern: No  Gang Involvement:No   Subjective: The patient had an individual therapy session in the office today.  The patient was smiling and pleasant.  The patient reports that she has been talking to her significant other and they seem to be doing better with their communication.  We talked today about her fiance's mother living with her in the hotel and her supporting her.  We talked about the need for her to set some limits with his mother as it seems that his mother is taking advantage of her kindness.  She does not want to put out his mother and his mother is not contributing to the finances whatsoever.  She reports that she is the one who was giving his mother money to buy alcohol and vape products.  I recommended that she speak with her fianc and they get on the same page about what they are going to do about the situation with his mother.  It seems that  there were too many people in their home and that is part of what contributed to the situation they are in now.  I gave her some suggestions on how to set limits with his mother and do it in a fair way.  Interventions: Cognitive Behavioral Therapy, Assertiveness/Communication, Psychologist, occupational, Insight-Oriented, and Interpersonal  Diagnosis:Adjustment disorder with mixed anxiety and depressed mood  PTSD (post-traumatic stress disorder)  Plan: Client Abilities/Strengths  Intelligent, insightful  Client Treatment Preferences  Outpatient Individual therapy  Client Statement of Needs  "I need some help with my anxiety and depression.  I also need help in learning how to be alone." Treatment Level  Outpatient Individual therapy  Symptoms  Excessive and/or unrealistic worry that is difficult to control occurring more days than not for at least 6  months about a number of events or activities.: No Description Entered (Status: maintained). Has been  exposed to a traumatic event involving actual or perceived threat of death or serious injury.: No  Description Entered (Status: maintained). Hypervigilance (e.g., feeling constantly on edge,  experiencing concentration difficulties, having trouble falling or staying asleep, exhibiting a general  state of irritability).: No Description Entered (Status: maintained). Impairment in social, occupational,  or other areas of functioning.: No Description Entered (Status: maintained).  Problems Addressed  Posttraumatic Stress Disorder (PTSD), Anxiety  Goals 1. Eliminate or reduce the negative impact trauma related symptoms have  on social, occupational, and family functioning. Objective Participate in Eye Movement  Desensitization and Reprocessing (EMDR) to reduce emotional distress  related to traumatic thoughts, feelings, and images. Target Date: 04/19/2024 Frequency: Weekly  Progress: 0 Modality: individual Related Interventions 1. Utilize Eye  Movement Desensitization and Reprocessing (EMDR) to reduce the client's  emotional reactivity to the traumatic event and reduce PTSD symptoms. 2. Stabilize anxiety level while increasing ability to function on a daily  basis. Objective Learn and implement problem-solving strategies for realistically addressing worries. Target Date: 04/19/2024 Frequency: Weekly Progress: 10 Modality: individual Related Interventions 1. Teach the client problem-solving strategies involving specifically defining a problem,  generating options for addressing it, evaluating the pros and cons of each option, selecting and  implementing an optional action, and reevaluating and refining the action (or assign "Applying  Problem-Solving to Interpersonal Conflict" in the Adult Psychotherapy Homework Planner by  Stephannie Li). Diagnosis F43.23  Adjustment Disorder with anxiety and depression F43.10 (Posttraumatic stress disorder) - Open - [Signifier: n/a] Posttraumatic Stress  Disorder  Conditions For Discharge Achievement of treatment goals and objectives   Dorothie Wah G Arden Tinoco, LCSW

## 2023-05-30 ENCOUNTER — Ambulatory Visit: Payer: Medicare Other | Admitting: Psychology

## 2023-06-07 ENCOUNTER — Ambulatory Visit (INDEPENDENT_AMBULATORY_CARE_PROVIDER_SITE_OTHER): Payer: Medicare Other | Admitting: Psychology

## 2023-06-07 DIAGNOSIS — F4310 Post-traumatic stress disorder, unspecified: Secondary | ICD-10-CM

## 2023-06-07 DIAGNOSIS — F4323 Adjustment disorder with mixed anxiety and depressed mood: Secondary | ICD-10-CM

## 2023-06-08 NOTE — Progress Notes (Signed)
 [facility] Counselor/Therapist Progress Note  Patient ID: OREL HORD, MRN: [id number],    Date: 06/07/2023  Time Spent: 60 minutes Time in:  4:00  Time out: 5:00  Treatment Type: Individual Therapy  Reported Symptoms: The patient attended an individual therapy session in the office today.  The patient reports sadness and anxiety as her primary symptoms.  She is having trouble sleeping and also feeling lonely.  Mental Status Exam: Appearance:  Casual     Behavior: Appropriate  Motor: Normal  Speech/Language:  Normal Rate  Affect: blunted  Mood: pleasant  Thought process: normal  Thought content:   WNL  Sensory/Perceptual disturbances:   WNL  Orientation: oriented to person, place, time/date, and situation  Attention: Good  Concentration: Good  Memory: WNL  Fund of knowledge:  Fair  Insight:   Good  Judgment:  Poor  Impulse Control: Poor   Risk Assessment: Danger to Self:  No Self-injurious Behavior: No Danger to Others: No Duty to Warn:no Physical Aggression / Violence:No  Access to Firearms a concern: No  Gang Involvement:No   Subjective: The patient had an individual therapy session in the office today.  The patient presents as pleasant and cooperative.  The patient reports that she and the children have moved back to the house and now it seems that she is not sure she wants to be there.  She reports that her mother-in-law stayed at the hotel and is trying to work out a situation where she can continue to stay there.  We talked about what to expect in a normal relationship.  The patient seems to have unrealistic expectations of what to expect from a long-term relationship.  I recommended that she think about what it is that she wants to happen in the relationship that she has and we talked about the need for her to make a decision about whether this was going to be something that she wanted to continue to stay in or not. Interventions: Cognitive  Behavioral Therapy, Assertiveness/Communication, Psychologist, Occupational, Insight-Oriented, and Interpersonal  Diagnosis:Adjustment disorder with mixed anxiety and depressed mood  PTSD (post-traumatic stress disorder)  Plan: Client Abilities/Strengths  Intelligent, insightful  Client Treatment Preferences  Outpatient Individual therapy  Client Statement of Needs  I need some help with my anxiety and depression.  I also need help in learning how to be alone. Treatment Level  Outpatient Individual therapy  Symptoms  Excessive and/or unrealistic worry that is difficult to control occurring more days than not for at least 6  months about a number of events or activities.: No Description Entered (Status: maintained). Has been  exposed to a traumatic event involving actual or perceived threat of death or serious injury.: No  Description Entered (Status: maintained). Hypervigilance (e.g., feeling constantly on edge,  experiencing concentration difficulties, having trouble falling or staying asleep, exhibiting a general  state of irritability).: No Description Entered (Status: maintained). Impairment in social, occupational,  or other areas of functioning.: No Description Entered (Status: maintained).  Problems Addressed  Posttraumatic Stress Disorder (PTSD), Anxiety  Goals 1. Eliminate or reduce the negative impact trauma related symptoms have  on social, occupational, and family functioning. Objective Participate in Eye Movement Desensitization and Reprocessing (EMDR) to reduce emotional distress  related to traumatic thoughts, feelings, and images. Target Date: 04/19/2024 Frequency: Weekly  Progress: 0 Modality: individual Related Interventions 1. Utilize Eye Movement Desensitization and Reprocessing (EMDR) to reduce the client's  emotional reactivity to the traumatic event and reduce PTSD symptoms.  2. Stabilize anxiety level while increasing ability to function on a daily   basis. Objective Learn and implement problem-solving strategies for realistically addressing worries. Target Date: 04/19/2024 Frequency: Weekly Progress: 10 Modality: individual Related Interventions 1. Teach the client problem-solving strategies involving specifically defining a problem,  generating options for addressing it, evaluating the pros and cons of each option, selecting and  implementing an optional action, and reevaluating and refining the action (or assign Applying  Problem-Solving to Interpersonal Conflict in the Adult Psychotherapy Homework Planner by  Jenniffer). Diagnosis F43.23  Adjustment Disorder with anxiety and depression F43.10 (Posttraumatic stress disorder) - Open - [Signifier: n/a] Posttraumatic Stress  Disorder  Conditions For Discharge Achievement of treatment goals and objectives   Taris Galindo G Linden Mikes, LCSW

## 2023-06-13 ENCOUNTER — Ambulatory Visit: Payer: Medicare Other | Admitting: Psychology

## 2023-06-13 DIAGNOSIS — F4323 Adjustment disorder with mixed anxiety and depressed mood: Secondary | ICD-10-CM

## 2023-06-13 DIAGNOSIS — F4310 Post-traumatic stress disorder, unspecified: Secondary | ICD-10-CM

## 2023-06-14 ENCOUNTER — Encounter: Payer: Medicare Other | Attending: Physical Medicine & Rehabilitation | Admitting: Physical Medicine & Rehabilitation

## 2023-06-14 ENCOUNTER — Encounter: Payer: Self-pay | Admitting: Physical Medicine & Rehabilitation

## 2023-06-14 VITALS — BP 157/95 | HR 84 | Ht 65.0 in | Wt 208.0 lb

## 2023-06-14 DIAGNOSIS — I10 Essential (primary) hypertension: Secondary | ICD-10-CM | POA: Insufficient documentation

## 2023-06-14 MED ORDER — LISINOPRIL 5 MG PO TABS: 5.0000 mg | ORAL_TABLET | Freq: Every day | ORAL | 4 refills | Status: DC

## 2023-06-14 NOTE — Progress Notes (Addendum)
 [facility] Counselor/Therapist Progress Note  Patient ID: Joyce [name], MRN: [id number],    Date: 06/13/2023  Time Spent: 60 minutes Time in:  5:00  Time out: 6:00  Treatment Type: Individual Therapy  Reported Symptoms: The patient attended an individual therapy session in the office today.  The patient reports sadness and anxiety as her primary symptoms.  She is having trouble sleeping and also feeling lonely.  Mental Status Exam: Appearance:  Casual     Behavior: Appropriate  Motor: Normal  Speech/Language:  Normal Rate  Affect: blunted  Mood: pleasant  Thought process: normal  Thought content:   WNL  Sensory/Perceptual disturbances:   WNL  Orientation: oriented to person, place, time/date, and situation  Attention: Good  Concentration: Good  Memory: WNL  Fund of knowledge:  Fair  Insight:   Good  Judgment:  Poor  Impulse Control: Poor   Risk Assessment: Danger to Self:  No Self-injurious Behavior: No Danger to Others: No Duty to Warn:no Physical Aggression / Violence:No  Access to Firearms a concern: No  Gang Involvement:No   Subjective: The patient had an individual therapy session in the office today.  The patient presents as pleasant and cooperative.  The patient reports that things seem to be going well with her relationship.  There are still a lot of other factors that influenced the situation.  They do have a lot of children living in the house and also she reports that there are struggles with her significant other and his children.  She seems to be managing her reactions better than she has in the past.  We talked about limit setting and how she may need to stay somewhat neutral with he and his kids.  We talked about strategies for her to manage her emotions around the situation.   Interventions: Cognitive Behavioral Therapy, Assertiveness/Communication, Psychologist, Occupational, Insight-Oriented, and Interpersonal  Diagnosis:Adjustment  disorder with mixed anxiety and depressed mood  PTSD (post-traumatic stress disorder)  Plan: Client Abilities/Strengths  Intelligent, insightful  Client Treatment Preferences  Outpatient Individual therapy  Client Statement of Needs  I need some help with my anxiety and depression.  I also need help in learning how to be alone. Treatment Level  Outpatient Individual therapy  Symptoms  Excessive and/or unrealistic worry that is difficult to control occurring more days than not for at least 6  months about a number of events or activities.: No Description Entered (Status: maintained). Has been  exposed to a traumatic event involving actual or perceived threat of death or serious injury.: No  Description Entered (Status: maintained). Hypervigilance (e.g., feeling constantly on edge,  experiencing concentration difficulties, having trouble falling or staying asleep, exhibiting a general  state of irritability).: No Description Entered (Status: maintained). Impairment in social, occupational,  or other areas of functioning.: No Description Entered (Status: maintained).  Problems Addressed  Posttraumatic Stress Disorder (PTSD), Anxiety  Goals 1. Eliminate or reduce the negative impact trauma related symptoms have  on social, occupational, and family functioning. Objective Participate in Eye Movement Desensitization and Reprocessing (EMDR) to reduce emotional distress  related to traumatic thoughts, feelings, and images. Target Date: 04/19/2024 Frequency: Weekly  Progress: 0 Modality: individual Related Interventions 1. Utilize Eye Movement Desensitization and Reprocessing (EMDR) to reduce the client's  emotional reactivity to the traumatic event and reduce PTSD symptoms. 2. Stabilize anxiety level while increasing ability to function on a daily  basis. Objective Learn and implement problem-solving strategies for realistically addressing worries. Target Date:  04/19/2024 Frequency:  Weekly Progress: 10 Modality: individual Related Interventions 1. Teach the client problem-solving strategies involving specifically defining a problem,  generating options for addressing it, evaluating the pros and cons of each option, selecting and  implementing an optional action, and reevaluating and refining the action (or assign Applying  Problem-Solving to Interpersonal Conflict in the Adult Psychotherapy Homework Planner by  Jenniffer). Diagnosis F43.23  Adjustment Disorder with anxiety and depression F43.10 (Posttraumatic stress disorder) - Open - [Signifier: n/a] Posttraumatic Stress  Disorder  Conditions For Discharge Achievement of treatment goals and objectives   [name] G Tytus Strahle, LCSW

## 2023-06-14 NOTE — Patient Instructions (Addendum)
 BE CAREFUL ABOUT PICKING UP HEAVY OBJECTS FROM THE GROUND. AVOID TWISTING AND BENDING AS WELL.    LYRICA : 50MG  TWICE DAILY FOR A WEEK THEN ONCE AT BEDTIME FOR A WEEK THEN STOP.  CONTINUE GABAPENTIN .    FOR YOUR RIGHT ELBOW: ELBOW COMPRESSIVE SLEEVE LIKE THE ONE I SHOWED YOU ON [employer] ICE EVERY 8 HOURS FOR A WEEK THEN AS NEEDED DICLOFENAC  GEL THREE X DAILY  TO ELBOW.

## 2023-06-14 NOTE — Progress Notes (Signed)
 Subjective:    Patient ID: Joyce [name], female    DOB: [birth year], 48 y.o.   MRN: [id number]  HPI  Mr. Gatt is here in follow up of her right thalamic infarct. She has lost some weight which has helped her knee and back pain. She has noticed some tingling and pain in the toes of her right foot. Her buttocks pain has resolved.   Recent MRI from 8/24:  L1-L2 shows minor disc and facet degenerative changes but no significant compression. L2/3 shows broad-based disc osteophyte protrusion along with prominent facet hypertrophy resulting in mild bilateral foraminal narrowing but no definite compression. L3-4 shows mild disc and facet degenerative changes without significant compression. L4-5 shows broad-based disc osteophyte protrusion along with severe bilateral facet hypertrophy resulting in severe bilateral foraminal and lateral recess stenosis and posterior canal narrowing with possible encroachment on exiting L5 nerve roots bilaterally. L5/S1 shows minor disc and facet degenerative changes but no significant compression.  The conus medullaris terminates at L1. The paraspinal soft tissue appear unremarkable.  Visualized portion of the lower thoracic vertebrae show minor degenerative changes as well.   She walks daily for about a mile and then does some stretching and bridging at home.  She is trying to eat better as a whole.  From a headache standpoint she has improved also.  She is on Lyrica  and gabapentin  at this point.  She feels that the gabapentin  helps the most.  She reports ongoing pain in her right elbow.  It has improved to an extent.  It hurts when she bends and off often when she picks up items with her right hand.     Pain Inventory Average Pain 9 Pain Right Now 8 My pain is intermittent, constant, stabbing, and tingling  LOCATION OF PAIN  right foot/toes, right elbow  BOWEL Number of stools per week:10-14 Oral laxative use No     BLADDER Normal    Mobility walk without assistance ability to climb steps?  yes do you drive?  yes Do you have any goals in this area?  yes  Function disabled: date disabled 2021 I need assistance with the following:  household duties Do you have any goals in this area?  yes  Neuro/Psych weakness numbness trouble walking spasms confusion depression anxiety  Prior Studies Any changes since last visit?  no  Physicians involved in your care Any changes since last visit?  no   Family History  Problem Relation Age of Onset   Diabetes Mother    Hypertension Mother    Kidney disease Mother    Asthma Mother    Hyperlipidemia Mother    Alzheimer's disease Father    Diabetes Father    Kidney disease Sister    Asthma Son    Diabetes Sister    Asthma Son    ADD / ADHD Son    Heart murmur Son    Asthma Son    Colon cancer Neg Hx    Colon polyps Neg Hx    Social History   Socioeconomic History   Marital status: Single    Spouse name: Hotel manager Love   Number of children: 8   Years of education: 9   Highest education level: Not on file  Occupational History   Occupation: unemployed    Comment: disabled  Tobacco Use   Smoking status: Never   Smokeless tobacco: Never  Vaping Use   Vaping status: Never Used  Substance and Sexual Activity   Alcohol use: Never  Drug use: No   Sexual activity: Not Currently    Birth control/protection: I.U.D.    Comment: followed by GYN  Other Topics Concern   Not on file  Social History Narrative   Lives with fiance and kids   Right Handed   Drinks >12 cans of soda in caffeine    Social Drivers of Health   Financial Resource Strain: High Risk (04/18/2023)   Overall Financial Resource Strain (CARDIA)    Difficulty of Paying Living Expenses: Very hard  Food Insecurity: Food Insecurity Present (04/18/2023)   Hunger Vital Sign    Worried About Running Out of Food in the Last Year: Sometimes true    Ran Out of Food in  the Last Year: Never true  Transportation Needs: No Transportation Needs (04/18/2023)   PRAPARE - Administrator, Civil Service (Medical): No    Lack of Transportation (Non-Medical): No  Physical Activity: Sufficiently Active (04/18/2023)   Exercise Vital Sign    Days of Exercise per Week: 5 days    Minutes of Exercise per Session: 40 min  Stress: Stress Concern Present (04/18/2023)   [employer] of Occupational Health - Occupational Stress Questionnaire    Feeling of Stress : Very much  Social Connections: Unknown (04/18/2023)   Social Connection and Isolation Panel [NHANES]    Frequency of Communication with Friends and Family: Never    Frequency of Social Gatherings with Friends and Family: Never    Attends Religious Services: More than 4 times per year    Active Member of Clubs or Organizations: No    Attends Banker Meetings: Never    Marital Status: Patient declined   Past Surgical History:  Procedure Laterality Date   CHOLECYSTECTOMY     COLONOSCOPY WITH PROPOFOL  N/A 02/09/2022   Procedure: COLONOSCOPY WITH PROPOFOL ;  Surgeon: Selena Daily, MD;  Location: [facility] ENDOSCOPY;  Service: Gastroenterology;  Laterality: N/A;   Past Medical History:  Diagnosis Date   Acute CVA (cerebrovascular accident) (HCC) 03/04/2020   Acute left-sided weakness 03/05/2020   Anemia    Depression    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   Hypertension    Lumbar radiculitis 06/23/2021   Migraine    PTSD (post-traumatic stress disorder) 05/01/2017   Round ligament pain 11/12/2014   BP (!) 158/85   Pulse 84   Ht 5\' 5"  (1.651 m)   Wt 208 lb (94.3 kg)   SpO2 98%   BMI 34.61 kg/m   Opioid Risk Score:   Fall Risk Score:  `1  Depression screen [facility] 2/9     06/14/2023    3:26 PM 04/18/2023    3:11 PM 03/01/2023    4:17 PM 01/06/2023    3:49 PM 12/14/2022    2:55 PM 08/15/2022    4:17 PM 05/11/2022    3:25 PM  Depression screen PHQ 2/9  Decreased Interest 1 3 1 1  1 1 2   Down, Depressed, Hopeless 1 3 1 1 1 2 3   PHQ - 2 Score 2 6 2 2 2 3 5   Altered sleeping  2 1 3  3 3   Tired, decreased energy  3 1 3  2 3   Change in appetite  2 3 3   0 0  Feeling bad or failure about yourself   3 1 2   0 2  Trouble concentrating  3 1 2  3 3   Moving slowly or fidgety/restless  1 0 0  0 1  Suicidal thoughts  0 0 0  0 0  PHQ-9 Score  20 9 15  11 17   Difficult doing work/chores   Somewhat difficult Somewhat difficult  Very difficult Somewhat difficult    Review of Systems  Musculoskeletal:  Positive for gait problem.       Spasms  Neurological:  Positive for weakness and numbness.  Psychiatric/Behavioral:  Positive for confusion.        Depression, anxiety  All other systems reviewed and are negative.      Objective:   Physical Exam  General: No acute distress HEENT: NCAT, EOMI, oral membranes moist Cards: reg rate  Chest: normal effort Abdomen: Soft, NT, ND Skin: dry, intact Extremities: no edema Psych: pleasant and appropriate  Neuro Alert and oriented x 3. Normal insight and awareness. Intact Memory. Normal language and speech. Cranial nerve exam unremarkable except for mild left facial sensory loss.  Sensory 1+ to 2 -/2 LUE and LLE remains present Motor 4+/5 on left 5/5 on right.  Weight shift is almost normal Musculoskeletal: Patient with mild pain over the common extensor tendon of the right elbow.  More of her pain is more proximal at the elbow and distal humerus laterally along the condyle.  Knee pain is minimal today.  She ambulated with good balance and minimal antalgia. Assessment & Plan:  1.  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  She stays active.  Her weight loss has helped a lot.             2.  Pain management/history of migraines               -Patient is on gabapentin  and Lyrica  still.  My plan was  ultimately to wean off of gabapentin  but she feels that the gabapentin  helps more than Lyrica .  Therefore we will just taper off Lyrica .  Instructions were given. 3.  Right knee pain: improved             -Diclofenac  gel             -supps for OA Dhave helped             -knee strengthening exercises             -ice             4.  Spasticity: improved. 5.  Hypertension: Increased Norvasc  to 10 mg daily  -begin lisinopril  5mg  qhs 6. Low back pain-patient has spondylosis and facet arthropathy on her most recent x-ray from last year.  MRI with stenosis and facet arthropathy at L4-5.  I suspect that she has had radiculopathy in her legs but that a lot of that has improved.  There may be some residual motor and sensory loss in her foot along the L5 dermatome.  As long as this is stable, I would just watch for now.  If patient notices progression of symptoms then we can look into further treatment.  We did talk about using appropriate mechanics with bending and lifting as well as just practicing commonsense.  7.  Right elbow pain consistent with lateral epicondylitis             -- Pain appears more proximal today  -Recommended compressive elbow sleeve.  Showed her an example online  -Ice 3 times a  day as needed for pain  -Diclofenac  gel 3 times daily  Thirty minutes of face to face patient care time were spent during this visit. All questions were encouraged and answered. Follow up with me in 4 months.

## 2023-06-20 ENCOUNTER — Ambulatory Visit (INDEPENDENT_AMBULATORY_CARE_PROVIDER_SITE_OTHER): Payer: Medicare Other | Admitting: Psychology

## 2023-06-20 DIAGNOSIS — F4323 Adjustment disorder with mixed anxiety and depressed mood: Secondary | ICD-10-CM | POA: Diagnosis not present

## 2023-06-20 DIAGNOSIS — F4310 Post-traumatic stress disorder, unspecified: Secondary | ICD-10-CM

## 2023-06-20 NOTE — Progress Notes (Unsigned)
[facility] Counselor/Therapist Progress Note  Patient ID: Joyce [name], MRN: [id number],    Date: 06/20/2023  Time Spent: 51 minutes Time in:  5:09  Time out: 6:00  Treatment Type: Individual Therapy  Reported Symptoms: The patient attended an individual therapy session.  The patient reports sadness and anxiety as her primary symptoms.  She is having trouble sleeping and also feeling lonely.  Mental Status Exam: Appearance:  Casual     Behavior: Appropriate  Motor: Normal  Speech/Language:  Normal Rate  Affect: blunted  Mood: pleasant  Thought process: normal  Thought content:   WNL  Sensory/Perceptual disturbances:   WNL  Orientation: oriented to person, place, time/date, and situation  Attention: Good  Concentration: Good  Memory: WNL  Fund of knowledge:  Fair  Insight:   Good  Judgment:  Poor  Impulse Control: Poor   Risk Assessment: Danger to Self:  No Self-injurious Behavior: No Danger to Others: No Duty to Warn:no Physical Aggression / Violence:No  Access to Firearms a concern: No  Gang Involvement:No   Subjective: The patient had an individual therapy session via video visit today.  The patient gave verbal consent for the session to be on caregility.  The patient was in her car alone and the therapist was in the office.  We ended up having to talk on the phone because she had technical difficulties.  The patient reports that things are going okay at the house but her mother-in-law had to move back in with them and she is not sure how that is going to go.  They have several people in the house and it is questionable as to whether she is going to be able to continue to tolerate the situation as it is.  She does report that her significant other Kevan Ny, is doing a little better job of communicating with her.  She is still not quite sure that she wants to continue to stay in that situation with all of the different children and personalities.  She  reports that she is not sure that she gets enough out of the situation in order to continue to deal with all of the different dynamics in the household.  She reports that she and Kevan Ny are not on the same page as far as parenting the children.  We will continue to work with the patient on trying to decide what it is that she would like to do and help her make a decision that would be good for her.  Interventions: Cognitive Behavioral Therapy, Assertiveness/Communication, Psychologist, occupational, Insight-Oriented, and Interpersonal  Diagnosis:Adjustment disorder with mixed anxiety and depressed mood  PTSD (post-traumatic stress disorder)  Plan: Client Abilities/Strengths  Intelligent, insightful  Client Treatment Preferences  Outpatient Individual therapy  Client Statement of Needs  "I need some help with my anxiety and depression.  I also need help in learning how to be alone." Treatment Level  Outpatient Individual therapy  Symptoms  Excessive and/or unrealistic worry that is difficult to control occurring more days than not for at least 6  months about a number of events or activities.: No Description Entered (Status: maintained). Has been  exposed to a traumatic event involving actual or perceived threat of death or serious injury.: No  Description Entered (Status: maintained). Hypervigilance (e.g., feeling constantly on edge,  experiencing concentration difficulties, having trouble falling or staying asleep, exhibiting a general  state of irritability).: No Description Entered (Status: maintained). Impairment in social, occupational,  or other areas of functioning.:  No Description Entered (Status: maintained).  Problems Addressed  Posttraumatic Stress Disorder (PTSD), Anxiety  Goals 1. Eliminate or reduce the negative impact trauma related symptoms have  on social, occupational, and family functioning. Objective Participate in Eye Movement Desensitization and Reprocessing (EMDR)  to reduce emotional distress  related to traumatic thoughts, feelings, and images. Target Date: 04/19/2024 Frequency: Weekly  Progress: 0 Modality: individual Related Interventions 1. Utilize Eye Movement Desensitization and Reprocessing (EMDR) to reduce the client's  emotional reactivity to the traumatic event and reduce PTSD symptoms. 2. Stabilize anxiety level while increasing ability to function on a daily  basis. Objective Learn and implement problem-solving strategies for realistically addressing worries. Target Date: 04/19/2024 Frequency: Weekly Progress: 10 Modality: individual Related Interventions 1. Teach the client problem-solving strategies involving specifically defining a problem,  generating options for addressing it, evaluating the pros and cons of each option, selecting and  implementing an optional action, and reevaluating and refining the action (or assign "Applying  Problem-Solving to Interpersonal Conflict" in the Adult Psychotherapy Homework Planner by  Stephannie Li). Diagnosis F43.23  Adjustment Disorder with anxiety and depression F43.10 (Posttraumatic stress disorder) - Open - [Signifier: n/a] Posttraumatic Stress  Disorder  Conditions For Discharge Achievement of treatment goals and objectives   Joyce Harold G Edelin Fryer, LCSW

## 2023-06-21 ENCOUNTER — Ambulatory Visit: Payer: Medicare Other | Admitting: Obstetrics & Gynecology

## 2023-06-27 ENCOUNTER — Ambulatory Visit: Payer: Medicare Other | Admitting: Psychology

## 2023-06-29 ENCOUNTER — Ambulatory Visit: Payer: Medicare Other | Admitting: Psychology

## 2023-06-29 NOTE — Progress Notes (Signed)
No show               Zamire Whitehurst G Gae Bihl, LCSW

## 2023-07-04 ENCOUNTER — Ambulatory Visit (INDEPENDENT_AMBULATORY_CARE_PROVIDER_SITE_OTHER): Payer: Medicare Other | Admitting: Psychology

## 2023-07-04 DIAGNOSIS — F4323 Adjustment disorder with mixed anxiety and depressed mood: Secondary | ICD-10-CM | POA: Diagnosis not present

## 2023-07-04 DIAGNOSIS — F4310 Post-traumatic stress disorder, unspecified: Secondary | ICD-10-CM | POA: Diagnosis not present

## 2023-07-05 NOTE — Progress Notes (Signed)
 [facility] Counselor/Therapist Progress Note  Patient ID: ZOUA CAPORASO, MRN: [id number],    Date: 07/04/2023  Time Spent: 60 minutes Time in:  4:02  Time out: 5:02  Treatment Type: Individual Therapy  Reported Symptoms: The patient attended an individual therapy session.  The patient reports sadness and anxiety as her primary symptoms.  She is having trouble sleeping and also feeling lonely.  Mental Status Exam: Appearance:  Casual     Behavior: Appropriate  Motor: Normal  Speech/Language:  Normal Rate  Affect: blunted  Mood: pleasant  Thought process: normal  Thought content:   WNL  Sensory/Perceptual disturbances:   WNL  Orientation: oriented to person, place, time/date, and situation  Attention: Good  Concentration: Good  Memory: WNL  Fund of knowledge:  Fair  Insight:   Good  Judgment:  Poor  Impulse Control: Poor   Risk Assessment: Danger to Self:  No Self-injurious Behavior: No Danger to Others: No Duty to Warn:no Physical Aggression / Violence:No  Access to Firearms a concern: No  Gang Involvement:No   Subjective: The patient had an individual therapy session in the office today.  The patient reports that she could not make the last session because her daughter was having a baby.  The patient states that things are going okay at home except her significant other lost his job.  She continues to sit back and watch things and see how things are going to play out but it seems like things have not changed a whole lot since she went back.  We talked about her continuing to do what she needs to do to be in the relationship and just continuing to monitor the situation to see whether she wants to continue to stay or not.  The patient is very sad about this relationship as she feels like it is not going to end up being what she thought it was supposed to be.  She states that she and her partner's daughter are saying some of the same things that he is not doing  in order to maintain relationship with both of them.  We will continue to process what the patient wants to do moving forward. Interventions: Cognitive Behavioral Therapy, Assertiveness/Communication, Psychologist, Occupational, Insight-Oriented, and Interpersonal  Diagnosis:Adjustment disorder with mixed anxiety and depressed mood  PTSD (post-traumatic stress disorder)  Plan: Client Abilities/Strengths  Intelligent, insightful  Client Treatment Preferences  Outpatient Individual therapy  Client Statement of Needs  I need some help with my anxiety and depression.  I also need help in learning how to be alone. Treatment Level  Outpatient Individual therapy  Symptoms  Excessive and/or unrealistic worry that is difficult to control occurring more days than not for at least 6  months about a number of events or activities.: No Description Entered (Status: maintained). Has been  exposed to a traumatic event involving actual or perceived threat of death or serious injury.: No  Description Entered (Status: maintained). Hypervigilance (e.g., feeling constantly on edge,  experiencing concentration difficulties, having trouble falling or staying asleep, exhibiting a general  state of irritability).: No Description Entered (Status: maintained). Impairment in social, occupational,  or other areas of functioning.: No Description Entered (Status: maintained).  Problems Addressed  Posttraumatic Stress Disorder (PTSD), Anxiety  Goals 1. Eliminate or reduce the negative impact trauma related symptoms have  on social, occupational, and family functioning. Objective Participate in Eye Movement Desensitization and Reprocessing (EMDR) to reduce emotional distress  related to traumatic thoughts, feelings, and images. Target  Date: 04/19/2024 Frequency: Weekly  Progress: 0 Modality: individual Related Interventions 1. Utilize Eye Movement Desensitization and Reprocessing (EMDR) to reduce the client's   emotional reactivity to the traumatic event and reduce PTSD symptoms. 2. Stabilize anxiety level while increasing ability to function on a daily  basis. Objective Learn and implement problem-solving strategies for realistically addressing worries. Target Date: 04/19/2024 Frequency: Weekly Progress: 20 Modality: individual Related Interventions 1. Teach the client problem-solving strategies involving specifically defining a problem,  generating options for addressing it, evaluating the pros and cons of each option, selecting and  implementing an optional action, and reevaluating and refining the action (or assign Applying  Problem-Solving to Interpersonal Conflict in the Adult Psychotherapy Homework Planner by  Jenniffer). Diagnosis F43.23  Adjustment Disorder with anxiety and depression F43.10 (Posttraumatic stress disorder) - Open - [Signifier: n/a] Posttraumatic Stress  Disorder  Conditions For Discharge Achievement of treatment goals and objectives   Kailan Laws G Winfrey Chillemi, LCSW

## 2023-07-10 ENCOUNTER — Encounter: Payer: Self-pay | Admitting: Internal Medicine

## 2023-07-10 ENCOUNTER — Ambulatory Visit (INDEPENDENT_AMBULATORY_CARE_PROVIDER_SITE_OTHER): Payer: Medicare Other | Admitting: Internal Medicine

## 2023-07-10 VITALS — BP 150/86 | HR 91 | Ht 65.0 in | Wt 204.0 lb

## 2023-07-10 DIAGNOSIS — Z1231 Encounter for screening mammogram for malignant neoplasm of breast: Secondary | ICD-10-CM

## 2023-07-10 DIAGNOSIS — G8194 Hemiplegia, unspecified affecting left nondominant side: Secondary | ICD-10-CM | POA: Diagnosis not present

## 2023-07-10 DIAGNOSIS — I10 Essential (primary) hypertension: Secondary | ICD-10-CM | POA: Diagnosis not present

## 2023-07-10 DIAGNOSIS — F4323 Adjustment disorder with mixed anxiety and depressed mood: Secondary | ICD-10-CM

## 2023-07-10 DIAGNOSIS — M778 Other enthesopathies, not elsewhere classified: Secondary | ICD-10-CM

## 2023-07-10 DIAGNOSIS — E782 Mixed hyperlipidemia: Secondary | ICD-10-CM

## 2023-07-10 DIAGNOSIS — Z131 Encounter for screening for diabetes mellitus: Secondary | ICD-10-CM

## 2023-07-10 DIAGNOSIS — F331 Major depressive disorder, recurrent, moderate: Secondary | ICD-10-CM | POA: Insufficient documentation

## 2023-07-10 MED ORDER — LISINOPRIL 20 MG PO TABS: 20.0000 mg | ORAL_TABLET | Freq: Every day | ORAL | 1 refills | Status: DC

## 2023-07-10 NOTE — Assessment & Plan Note (Addendum)
 Uncontrolled BP with normal exam. Current regimen is amlodipine .  Lisinopril  5 mg added by Phys Med. Will increase lisinopril  to 20 mg daily. encourage continued reduced sodium diet. Recheck in 6 weeks.

## 2023-07-10 NOTE — Assessment & Plan Note (Signed)
 LDL is  Lab Results  Component Value Date   LDLCALC 68 09/18/2022   Current regimen is Crestor .  Tolerating medications well without issues.

## 2023-07-10 NOTE — Patient Instructions (Signed)
 Call [facility] hospital to schedule mammogram

## 2023-07-10 NOTE — Progress Notes (Signed)
 Date:  07/10/2023   Name:  Joyce [name]   DOB:  [birth year]   MRN:  [id number]   Chief Complaint: Annual Exam Joyce [name] is a 48 y.o. female who presents today for her Complete Annual Exam. She feels well. She reports exercising some. She reports she is sleeping fairly well. Breast complaints - none.  Health Maintenance  Topic Date Due   Medicare Annual Wellness Visit  Never done   COVID-19 Vaccine (1) 07/26/2023*   DTaP/Tdap/Td vaccine (2 - Td or Tdap) 05/25/2025   Pap with HPV screening  04/17/2028   Colon Cancer Screening  02/10/2032   Flu Shot  Completed   Hepatitis C Screening  Completed   HIV Screening  Completed   HPV Vaccine  Aged Out  *Topic was postponed. The date shown is not the original due date.    Hypertension This is a chronic problem. The problem is controlled. Pertinent negatives include no chest pain, headaches, palpitations or shortness of breath.  Hyperlipidemia This is a chronic problem. Pertinent negatives include no chest pain or shortness of breath. Current antihyperlipidemic treatment includes statins.  CVA - stable mild deficit.   Review of Systems  Constitutional:  Negative for chills, fatigue and fever.  HENT:  Negative for congestion, hearing loss, tinnitus, trouble swallowing and voice change.   Eyes:  Negative for visual disturbance.  Respiratory:  Negative for cough, chest tightness, shortness of breath and wheezing.   Cardiovascular:  Negative for chest pain, palpitations and leg swelling.  Gastrointestinal:  Negative for abdominal pain, constipation, diarrhea and vomiting.  Endocrine: Negative for polydipsia and polyuria.  Genitourinary:  Negative for dysuria, frequency, genital sores, vaginal bleeding and vaginal discharge.  Musculoskeletal:  Positive for arthralgias (right elbow). Negative for gait problem and joint swelling.  Skin:  Negative for color change and rash.  Neurological:  Negative for dizziness, tremors,  light-headedness and headaches.  Hematological:  Negative for adenopathy. Does not bruise/bleed easily.  Psychiatric/Behavioral:  Positive for dysphoric mood. Negative for sleep disturbance. The patient is nervous/anxious.      Lab Results  Component Value Date   NA 138 03/28/2023   K 2.9 (L) 03/28/2023   CO2 24 03/28/2023   GLUCOSE 104 (H) 03/28/2023   BUN 7 03/28/2023   CREATININE 0.63 03/28/2023   CALCIUM  9.2 03/28/2023   EGFR 109 08/15/2022   GFRNONAA >60 03/28/2023   Lab Results  Component Value Date   CHOL 119 09/18/2022   HDL 37 (L) 09/18/2022   LDLCALC 68 09/18/2022   TRIG 68 09/18/2022   CHOLHDL 3.2 09/18/2022   Lab Results  Component Value Date   TSH 0.688 08/15/2022   Lab Results  Component Value Date   HGBA1C 5.5 09/18/2022   Lab Results  Component Value Date   WBC 11.5 (H) 03/28/2023   HGB 10.3 (L) 03/28/2023   HCT 35.6 (L) 03/28/2023   MCV 65.9 (L) 03/28/2023   PLT 398 03/28/2023   Lab Results  Component Value Date   ALT 18 03/28/2023   AST 24 03/28/2023   ALKPHOS 49 03/28/2023   BILITOT 0.3 03/28/2023   Lab Results  Component Value Date   VD25OH 33 02/15/2017     Patient Active Problem List   Diagnosis Date Noted   Cystic acne 03/01/2023   Sleep disorder 12/14/2022   Paresthesia of skin 09/18/2022   Mixed hyperlipidemia 09/18/2022   Primary osteoarthritis of knee 09/14/2022   Iron deficiency anemia 01/11/2022   Hamstring  tendinitis at origin 10/13/2021   Lumbar radiculitis 06/23/2021   Left lateral epicondylitis 06/23/2021   Chronic left-sided low back pain without sciatica 07/01/2020   Neuropathic pain 05/06/2020   Anxiety state    Migraine without status migrainosus, not intractable    History of stroke 03/10/2020   Left hemiparesis (HCC) 03/10/2020   Recurrent falls 03/05/2020   Adjustment disorder with mixed anxiety and depressed mood 05/01/2017   PTSD (post-traumatic stress disorder) 05/01/2017   Essential hypertension  07/20/2016   Uterine fibroid 01/27/2016   Cervical high risk HPV (human papillomavirus) test positive 12/08/2014    No Known Allergies  Past Surgical History:  Procedure Laterality Date   CHOLECYSTECTOMY     COLONOSCOPY WITH PROPOFOL  N/A 02/09/2022   Procedure: COLONOSCOPY WITH PROPOFOL ;  Surgeon: Selena Daily, MD;  Location: [facility] ENDOSCOPY;  Service: Gastroenterology;  Laterality: N/A;    Social History   Tobacco Use   Smoking status: Never   Smokeless tobacco: Never  Vaping Use   Vaping status: Never Used  Substance Use Topics   Alcohol use: Never   Drug use: No     Medication list has been reviewed and updated.  Current Meds  Medication Sig   amLODipine  (NORVASC ) 10 MG tablet Take 1 tablet (10 mg total) by mouth daily.   aspirin  EC 81 MG tablet Take 81 mg by mouth daily. Swallow whole.   clindamycin  (CLEOCIN  T) 1 % external solution Apply topically 2 (two) times daily.   fluticasone  (FLONASE ) 50 MCG/ACT nasal spray Use 2 sprays in each nostril BID for a week. After 1 week, decrease to 1 spray in each nostril BID as needed for congestion/allergies.   gabapentin  (NEURONTIN ) 600 MG tablet Take 1 tablet (600 mg total) by mouth 3 (three) times daily.   ibuprofen  (ADVIL ) 600 MG tablet Take by mouth.   rosuvastatin  (CRESTOR ) 10 MG tablet Take 1 tablet (10 mg total) by mouth daily.   tiZANidine  (ZANAFLEX ) 4 MG tablet Take 4 mg by mouth 2 (two) times daily.   topiramate  ER (QUDEXY  XR) 150 MG CS24 sprinkle capsule Take 1 capsule (150 mg total) by mouth at bedtime.   traZODone  (DESYREL ) 100 MG tablet Take 1 tablet (100 mg total) by mouth at bedtime.   [DISCONTINUED] lisinopril  (ZESTRIL ) 5 MG tablet Take 1 tablet (5 mg total) by mouth at bedtime.   [DISCONTINUED] PARAGARD  INTRAUTERINE COPPER  IU by Intrauterine route.       07/10/2023   11:11 AM 04/18/2023    3:11 PM 03/01/2023    4:18 PM 01/06/2023    3:49 PM  GAD 7 : Generalized Anxiety Score  Nervous, Anxious, on Edge 2  2 3 2   Control/stop worrying 3 3 3 3   Worry too much - different things 3 3 3 3   Trouble relaxing 2 3 3 3   Restless 2 2 3 3   Easily annoyed or irritable 3 2 3 3   Afraid - awful might happen 2 3 3 2   Total GAD 7 Score 17 18 21 19   Anxiety Difficulty Very difficult  Extremely difficult Somewhat difficult       07/10/2023   11:11 AM 06/14/2023    3:26 PM 04/18/2023    3:11 PM  Depression screen PHQ 2/9  Decreased Interest 2 1 3   Down, Depressed, Hopeless 3 1 3   PHQ - 2 Score 5 2 6   Altered sleeping 3  2  Tired, decreased energy 2  3  Change in appetite 1  2  Feeling  bad or failure about yourself  3  3  Trouble concentrating 3  3  Moving slowly or fidgety/restless 1  1  Suicidal thoughts 0  0  PHQ-9 Score 18  20  Difficult doing work/chores Very difficult      BP Readings from Last 3 Encounters:  07/10/23 (!) 150/86  06/14/23 (!) 157/95  05/02/23 (!) 149/81    Physical Exam Vitals and nursing note reviewed.  Constitutional:      General: She is not in acute distress.    Appearance: She is well-developed.  HENT:     Head: Normocephalic and atraumatic.  Cardiovascular:     Rate and Rhythm: Normal rate and regular rhythm.     Heart sounds: No murmur heard. Pulmonary:     Effort: Pulmonary effort is normal. No respiratory distress.     Breath sounds: No wheezing or rhonchi.  Chest:  Breasts:    Right: No mass, nipple discharge, skin change or tenderness.     Left: No mass, nipple discharge, skin change or tenderness.  Musculoskeletal:     Cervical back: Normal range of motion.     Right lower leg: No edema.     Left lower leg: No edema.  Skin:    General: Skin is warm and dry.     Capillary Refill: Capillary refill takes less than 2 seconds.     Findings: No rash.  Neurological:     General: No focal deficit present.     Mental Status: She is alert and oriented to person, place, and time.  Psychiatric:        Attention and Perception: Attention normal.         Mood and Affect: Mood normal.        Behavior: Behavior normal.        Thought Content: Thought content does not include suicidal ideation. Thought content does not include suicidal plan.     Wt Readings from Last 3 Encounters:  07/10/23 204 lb (92.5 kg)  06/14/23 208 lb (94.3 kg)  05/02/23 203 lb (92.1 kg)    BP (!) 150/86   Pulse 91   Ht 5\' 5"  (1.651 m)   Wt 204 lb (92.5 kg)   SpO2 100%   BMI 33.95 kg/m   Assessment and Plan:  Problem List Items Addressed This Visit       Unprioritized   Essential hypertension - Primary (Chronic)   Uncontrolled BP with normal exam. Current regimen is amlodipine .  Lisinopril  5 mg added by Phys Med. Will increase lisinopril  to 20 mg daily. encourage continued reduced sodium diet. Recheck in 6 weeks.       Relevant Medications   lisinopril  (ZESTRIL ) 20 MG tablet   Other Relevant Orders   CBC with Differential/Platelet   Comprehensive metabolic panel   TSH   Urinalysis, Routine w reflex microscopic   Adjustment disorder with mixed anxiety and depressed mood   Currently under Psych care - seeing a counselor regularly Taking Trazodone  for help with sleep May need to see Psych MD for medication - she will discuss with counselor      Left hemiparesis (HCC) (Chronic)   Stable ambulatory issues follow CVA in 2021 Intermittent toe numbness only.      Mixed hyperlipidemia   LDL is  Lab Results  Component Value Date   LDLCALC 68 09/18/2022   Current regimen is Crestor .  Tolerating medications well without issues.       Relevant Medications   lisinopril  (ZESTRIL )  20 MG tablet   Other Relevant Orders   Lipid panel   Other Visit Diagnoses       Encounter for screening mammogram for breast cancer       Relevant Orders   MM 3D SCREENING MAMMOGRAM BILATERAL BREAST     Screening for diabetes mellitus       Relevant Orders   Hemoglobin A1c     Right elbow tendonitis       due to repetitive work use topical rub and ice after  work may need to see Ortho       Return in about 6 weeks (around 08/21/2023) for HTN.    Sheron Dixons, MD [facility] [place]

## 2023-07-10 NOTE — Assessment & Plan Note (Addendum)
 Stable ambulatory issues follow CVA in 2021 Intermittent toe numbness only.

## 2023-07-10 NOTE — Assessment & Plan Note (Addendum)
 Currently under Psych care - seeing a counselor regularly Taking Trazodone  for help with sleep May need to see Psych MD for medication - she will discuss with counselor

## 2023-07-12 ENCOUNTER — Ambulatory Visit: Payer: Medicare Other | Admitting: Psychology

## 2023-07-12 DIAGNOSIS — F4310 Post-traumatic stress disorder, unspecified: Secondary | ICD-10-CM | POA: Diagnosis not present

## 2023-07-12 DIAGNOSIS — F4323 Adjustment disorder with mixed anxiety and depressed mood: Secondary | ICD-10-CM | POA: Diagnosis not present

## 2023-07-12 LAB — URINALYSIS, ROUTINE W REFLEX MICROSCOPIC
Bilirubin, UA: NEGATIVE
Glucose, UA: NEGATIVE
Ketones, UA: NEGATIVE
Leukocytes,UA: NEGATIVE
Nitrite, UA: NEGATIVE
Protein,UA: NEGATIVE
RBC, UA: NEGATIVE
Specific Gravity, UA: <=1.005 — AB (ref 1.005–1.030)
Urobilinogen, Ur: 0.2 mg/dL (ref 0.2–1.0)
pH, UA: 7 (ref 5.0–7.5)

## 2023-07-13 ENCOUNTER — Encounter: Payer: Self-pay | Admitting: Internal Medicine

## 2023-07-14 NOTE — Progress Notes (Signed)
[facility] Counselor/Therapist Progress Note  Patient ID: ANNELISE MCCOY, MRN: [id number],    Date: 07/12/2023  Time Spent: 60 minutes Time in:  4:00  Time out: 5:00  Treatment Type: Individual Therapy  Reported Symptoms: The patient attended an individual therapy session.  The patient reports sadness and anxiety as her primary symptoms.  She is having trouble sleeping and also feeling lonely.  Mental Status Exam: Appearance:  Casual     Behavior: Appropriate  Motor: Normal  Speech/Language:  Normal Rate  Affect: blunted  Mood: pleasant  Thought process: normal  Thought content:   WNL  Sensory/Perceptual disturbances:   WNL  Orientation: oriented to person, place, time/date, and situation  Attention: Good  Concentration: Good  Memory: WNL  Fund of knowledge:  Fair  Insight:   Good  Judgment:  Poor  Impulse Control: Poor   Risk Assessment: Danger to Self:  No Self-injurious Behavior: No Danger to Others: No Duty to Warn:no Physical Aggression / Violence:No  Access to Firearms a concern: No  Gang Involvement:No   Subjective: The patient had an individual therapy session in the office today.  The patient presents as pleasant and cooperative.  She reports that her significant other did get a job as a IT trainer.  We talked about him doing that previously and he has had difficulty with that line of work.  The patient still has not made any decision about what she wants to do or how she wants to move forward.  She decided just to sit back and watch and see how things play out with her relationship and also with how things are going with his children and her children and all of the people living in the home.  She does report some anxiety about having to deal with his daughter who is 28.  We talked about her just waiting to see what happens and not making any assumptions that it is not going to be a good circumstance.  The patient does want to hopefully go back to  school and get her GED and then try to find some sort of training on computers so that she can get herself a better job at some point and she may be waiting things out until she can do this.  Interventions: Cognitive Behavioral Therapy, Assertiveness/Communication, Psychologist, occupational, Insight-Oriented, and Interpersonal  Diagnosis:Adjustment disorder with mixed anxiety and depressed mood  PTSD (post-traumatic stress disorder)  Plan: Client Abilities/Strengths  Intelligent, insightful  Client Treatment Preferences  Outpatient Individual therapy  Client Statement of Needs  "I need some help with my anxiety and depression.  I also need help in learning how to be alone." Treatment Level  Outpatient Individual therapy  Symptoms  Excessive and/or unrealistic worry that is difficult to control occurring more days than not for at least 6  months about a number of events or activities.: No Description Entered (Status: maintained). Has been  exposed to a traumatic event involving actual or perceived threat of death or serious injury.: No  Description Entered (Status: maintained). Hypervigilance (e.g., feeling constantly on edge,  experiencing concentration difficulties, having trouble falling or staying asleep, exhibiting a general  state of irritability).: No Description Entered (Status: maintained). Impairment in social, occupational,  or other areas of functioning.: No Description Entered (Status: maintained).  Problems Addressed  Posttraumatic Stress Disorder (PTSD), Anxiety  Goals 1. Eliminate or reduce the negative impact trauma related symptoms have  on social, occupational, and family functioning. Objective Participate in Eye Movement Desensitization  and Reprocessing (EMDR) to reduce emotional distress  related to traumatic thoughts, feelings, and images. Target Date: 04/19/2024 Frequency: Weekly  Progress: 0 Modality: individual Related Interventions 1. Utilize Eye Movement  Desensitization and Reprocessing (EMDR) to reduce the client's  emotional reactivity to the traumatic event and reduce PTSD symptoms. 2. Stabilize anxiety level while increasing ability to function on a daily  basis. Objective Learn and implement problem-solving strategies for realistically addressing worries. Target Date: 04/19/2024 Frequency: Weekly Progress: 30 Modality: individual Related Interventions 1. Teach the client problem-solving strategies involving specifically defining a problem,  generating options for addressing it, evaluating the pros and cons of each option, selecting and  implementing an optional action, and reevaluating and refining the action (or assign "Applying  Problem-Solving to Interpersonal Conflict" in the Adult Psychotherapy Homework Planner by  Stephannie Li). Diagnosis F43.23  Adjustment Disorder with anxiety and depression F43.10 (Posttraumatic stress disorder) - Open - [Signifier: n/a] Posttraumatic Stress  Disorder  Conditions For Discharge Achievement of treatment goals and objectives   Montoya Brandel G Liv Rallis, LCSW

## 2023-07-18 ENCOUNTER — Ambulatory Visit (INDEPENDENT_AMBULATORY_CARE_PROVIDER_SITE_OTHER): Payer: Medicare Other | Admitting: Psychology

## 2023-07-18 DIAGNOSIS — F4310 Post-traumatic stress disorder, unspecified: Secondary | ICD-10-CM

## 2023-07-18 DIAGNOSIS — F4323 Adjustment disorder with mixed anxiety and depressed mood: Secondary | ICD-10-CM | POA: Diagnosis not present

## 2023-07-19 NOTE — Progress Notes (Signed)
 [facility] Counselor/Therapist Progress Note  Patient ID: Joyce [name], MRN: [id number],    Date: 07/18/2023  Time Spent: 50 minutes Time in:  5:10  Time out: 6:00  Treatment Type: Individual Therapy  Reported Symptoms: The patient attended an individual therapy session.  The patient reports sadness and anxiety as her primary symptoms.  She is having trouble sleeping and also feeling lonely.  Mental Status Exam: Appearance:  Casual     Behavior: Appropriate  Motor: Normal  Speech/Language:  Normal Rate  Affect: blunted  Mood: pleasant  Thought process: normal  Thought content:   WNL  Sensory/Perceptual disturbances:   WNL  Orientation: oriented to person, place, time/date, and situation  Attention: Good  Concentration: Good  Memory: WNL  Fund of knowledge:  Fair  Insight:   Good  Judgment:  Poor  Impulse Control: Poor   Risk Assessment: Danger to Self:  No Self-injurious Behavior: No Danger to Others: No Duty to Warn:no Physical Aggression / Violence:No  Access to Firearms a concern: No  Gang Involvement:No   Subjective: The patient had an individual therapy session via video visit today.  The patient gave verbal consent for the session to be on teams.  The patient was in her car alone and the therapist was in the office.  The patient had forgotten that she was going to come into the office today and so we ended up having to do it on line.  The patient is aware of the limitations of telehealth.  She presents as pleasant and cooperative.  The patient reports that she still has not made a decision about what she wants to do yet with her relationship.  She does have an awareness that she was having unrealistic expectations before.  We talked about her wanting the fairytale relationship and that those generally do not exist.  We discussed the need for her to get herself in a place where she feels like she can take care of herself and then she can make the  decision that she wants to make and either be able to support herself or stay in the relationship.  She is doing better than she was when I first met her and she seems to have more realistic expectations.    Interventions: Cognitive Behavioral Therapy, Assertiveness/Communication, Psychologist, occupational, Insight-Oriented, and Interpersonal  Diagnosis:Adjustment disorder with mixed anxiety and depressed mood  PTSD (post-traumatic stress disorder)  Plan: Client Abilities/Strengths  Intelligent, insightful  Client Treatment Preferences  Outpatient Individual therapy  Client Statement of Needs  "I need some help with my anxiety and depression.  I also need help in learning how to be alone." Treatment Level  Outpatient Individual therapy  Symptoms  Excessive and/or unrealistic worry that is difficult to control occurring more days than not for at least 6  months about a number of events or activities.: No Description Entered (Status: maintained). Has been  exposed to a traumatic event involving actual or perceived threat of death or serious injury.: No  Description Entered (Status: maintained). Hypervigilance (e.g., feeling constantly on edge,  experiencing concentration difficulties, having trouble falling or staying asleep, exhibiting a general  state of irritability).: No Description Entered (Status: maintained). Impairment in social, occupational,  or other areas of functioning.: No Description Entered (Status: maintained).  Problems Addressed  Posttraumatic Stress Disorder (PTSD), Anxiety  Goals 1. Eliminate or reduce the negative impact trauma related symptoms have  on social, occupational, and family functioning. Objective Participate in Eye Movement Desensitization and Reprocessing (  EMDR) to reduce emotional distress  related to traumatic thoughts, feelings, and images. Target Date: 04/19/2024 Frequency: Weekly  Progress: 0 Modality: individual Related Interventions 1. Utilize  Eye Movement Desensitization and Reprocessing (EMDR) to reduce the client's  emotional reactivity to the traumatic event and reduce PTSD symptoms. 2. Stabilize anxiety level while increasing ability to function on a daily  basis. Objective Learn and implement problem-solving strategies for realistically addressing worries. Target Date: 04/19/2024 Frequency: Weekly Progress: 30 Modality: individual Related Interventions 1. Teach the client problem-solving strategies involving specifically defining a problem,  generating options for addressing it, evaluating the pros and cons of each option, selecting and  implementing an optional action, and reevaluating and refining the action (or assign "Applying  Problem-Solving to Interpersonal Conflict" in the Adult Psychotherapy Homework Planner by  Stephannie Li). Diagnosis F43.23  Adjustment Disorder with anxiety and depression F43.10 (Posttraumatic stress disorder) - Open - [Signifier: n/a] Posttraumatic Stress  Disorder  Conditions For Discharge Achievement of treatment goals and objectives   Lashae Wollenberg G Daivion Pape, LCSW

## 2023-07-21 LAB — LIPID PANEL
Chol/HDL Ratio: 2.9 {ratio} (ref 0.0–4.4)
Cholesterol, Total: 154 mg/dL (ref 100–199)
HDL: 54 mg/dL (ref >39)
LDL Chol Calc (NIH): 89 mg/dL (ref 0–99)
Triglycerides: 55 mg/dL (ref 0–149)
VLDL Cholesterol Cal: 11 mg/dL (ref 5–40)

## 2023-07-21 LAB — CBC WITH DIFFERENTIAL/PLATELET
Basophils Absolute: 0.1 10*3/uL (ref 0.0–0.2)
Basos: 1 %
EOS (ABSOLUTE): 0.1 10*3/uL (ref 0.0–0.4)
Eos: 1 %
Hematocrit: 42.4 % (ref 34.0–46.6)
Hemoglobin: 13.4 g/dL (ref 11.1–15.9)
Immature Grans (Abs): 0 10*3/uL (ref 0.0–0.1)
Immature Granulocytes: 0 %
Lymphocytes Absolute: 1.9 10*3/uL (ref 0.7–3.1)
Lymphs: 26 %
MCH: 23.3 pg — ABNORMAL LOW (ref 26.6–33.0)
MCHC: 31.6 g/dL (ref 31.5–35.7)
MCV: 74 fL — ABNORMAL LOW (ref 79–97)
Monocytes Absolute: 0.4 10*3/uL (ref 0.1–0.9)
Monocytes: 5 %
Neutrophils Absolute: 4.9 10*3/uL (ref 1.4–7.0)
Neutrophils: 67 %
Platelets: 363 10*3/uL (ref 150–450)
RBC: 5.75 x10E6/uL — ABNORMAL HIGH (ref 3.77–5.28)
RDW: 17.1 % — ABNORMAL HIGH (ref 11.7–15.4)
WBC: 7.4 10*3/uL (ref 3.4–10.8)

## 2023-07-21 LAB — COMPREHENSIVE METABOLIC PANEL
ALT: 16 [IU]/L (ref 0–32)
AST: 19 [IU]/L (ref 0–40)
Albumin: 4.3 g/dL (ref 3.9–4.9)
Alkaline Phosphatase: 60 [IU]/L (ref 44–121)
BUN/Creatinine Ratio: 11 (ref 9–23)
BUN: 8 mg/dL (ref 6–24)
Bilirubin Total: 0.4 mg/dL (ref 0.0–1.2)
CO2: 23 mmol/L (ref 20–29)
Calcium: 9.7 mg/dL (ref 8.7–10.2)
Chloride: 102 mmol/L (ref 96–106)
Creatinine, Ser: 0.76 mg/dL (ref 0.57–1.00)
Globulin, Total: 3 g/dL (ref 1.5–4.5)
Glucose: 80 mg/dL (ref 70–99)
Potassium: 4.5 mmol/L (ref 3.5–5.2)
Sodium: 139 mmol/L (ref 134–144)
Total Protein: 7.3 g/dL (ref 6.0–8.5)
eGFR: 98 mL/min/{1.73_m2} (ref >59)

## 2023-07-21 LAB — HEMOGLOBIN A1C
Est. average glucose Bld gHb Est-mCnc: 120 mg/dL
Hgb A1c MFr Bld: 5.8 % — ABNORMAL HIGH (ref 4.8–5.6)

## 2023-07-21 LAB — TSH: TSH: 0.688 u[IU]/mL (ref 0.450–4.500)

## 2023-07-25 ENCOUNTER — Ambulatory Visit (INDEPENDENT_AMBULATORY_CARE_PROVIDER_SITE_OTHER): Payer: Medicare Other | Admitting: Psychology

## 2023-07-25 DIAGNOSIS — F4310 Post-traumatic stress disorder, unspecified: Secondary | ICD-10-CM | POA: Diagnosis not present

## 2023-07-25 DIAGNOSIS — F4323 Adjustment disorder with mixed anxiety and depressed mood: Secondary | ICD-10-CM | POA: Diagnosis not present

## 2023-07-26 NOTE — Progress Notes (Signed)
 [facility] Counselor/Therapist Progress Note  Patient ID: RHEALYN CULLEN, MRN: [id number],    Date: 07/25/2023  Time Spent: 60 minutes Time in:  4:00  Time out: 5:00  Treatment Type: Individual Therapy  Reported Symptoms: The patient attended an individual therapy session.  The patient reports sadness and anxiety as her primary symptoms.  She is having trouble sleeping and also feeling lonely.  Mental Status Exam: Appearance:  Casual     Behavior: Appropriate  Motor: Normal  Speech/Language:  Normal Rate  Affect: blunted  Mood: pleasant  Thought process: normal  Thought content:   WNL  Sensory/Perceptual disturbances:   WNL  Orientation: oriented to person, place, time/date, and situation  Attention: Good  Concentration: Good  Memory: WNL  Fund of knowledge:  Fair  Insight:   Good  Judgment:  Poor  Impulse Control: Poor   Risk Assessment: Danger to Self:  No Self-injurious Behavior: No Danger to Others: No Duty to Warn:no Physical Aggression / Violence:No  Access to Firearms a concern: No  Gang Involvement:No   Subjective: The patient had an individual therapy session in the office today.  The patient presents as pleasant and cooperative.  The patient reports that things seem to be going better with her relationship.  The patient reports that her significant other has been doing a better job of attending to what she needs and seems to be doing better.  She did report that he has asked her to marry him and they are going to have to go to couples counseling through their church if they decide to go in that direction.  The patient does seem more willing to do that at this point in time.  We talked about the importance of her keeping her expectations realistic.  We did decide to go to every other week as she seems to be managing things okay right now.  Interventions: Cognitive Behavioral Therapy, Assertiveness/Communication, Psychologist, occupational,  Insight-Oriented, and Interpersonal  Diagnosis:Adjustment disorder with mixed anxiety and depressed mood  PTSD (post-traumatic stress disorder)  Plan: Client Abilities/Strengths  Intelligent, insightful  Client Treatment Preferences  Outpatient Individual therapy  Client Statement of Needs  "I need some help with my anxiety and depression.  I also need help in learning how to be alone." Treatment Level  Outpatient Individual therapy  Symptoms  Excessive and/or unrealistic worry that is difficult to control occurring more days than not for at least 6  months about a number of events or activities.: No Description Entered (Status: maintained). Has been  exposed to a traumatic event involving actual or perceived threat of death or serious injury.: No  Description Entered (Status: maintained). Hypervigilance (e.g., feeling constantly on edge,  experiencing concentration difficulties, having trouble falling or staying asleep, exhibiting a general  state of irritability).: No Description Entered (Status: maintained). Impairment in social, occupational,  or other areas of functioning.: No Description Entered (Status: maintained).  Problems Addressed  Posttraumatic Stress Disorder (PTSD), Anxiety  Goals 1. Eliminate or reduce the negative impact trauma related symptoms have  on social, occupational, and family functioning. Objective Participate in Eye Movement Desensitization and Reprocessing (EMDR) to reduce emotional distress  related to traumatic thoughts, feelings, and images. Target Date: 04/19/2024 Frequency: Weekly  Progress: 0 Modality: individual Related Interventions 1. Utilize Eye Movement Desensitization and Reprocessing (EMDR) to reduce the client's  emotional reactivity to the traumatic event and reduce PTSD symptoms. 2. Stabilize anxiety level while increasing ability to function on a daily  basis. Objective  Learn and implement problem-solving strategies for  realistically addressing worries. Target Date: 04/19/2024 Frequency: Weekly Progress: 40 Modality: individual Related Interventions 1. Teach the client problem-solving strategies involving specifically defining a problem,  generating options for addressing it, evaluating the pros and cons of each option, selecting and  implementing an optional action, and reevaluating and refining the action (or assign "Applying  Problem-Solving to Interpersonal Conflict" in the Adult Psychotherapy Homework Planner by  Stephannie Li). Diagnosis F43.23  Adjustment Disorder with anxiety and depression F43.10 (Posttraumatic stress disorder) - Open - [Signifier: n/a] Posttraumatic Stress  Disorder  Conditions For Discharge Achievement of treatment goals and objectives   Miangel Flom G Emy Angevine, LCSW

## 2023-08-03 ENCOUNTER — Ambulatory Visit: Payer: Medicare Other | Admitting: Psychology

## 2023-08-08 ENCOUNTER — Ambulatory Visit (INDEPENDENT_AMBULATORY_CARE_PROVIDER_SITE_OTHER): Payer: Medicare Other | Admitting: Psychology

## 2023-08-08 DIAGNOSIS — F4323 Adjustment disorder with mixed anxiety and depressed mood: Secondary | ICD-10-CM | POA: Diagnosis not present

## 2023-08-08 DIAGNOSIS — F4310 Post-traumatic stress disorder, unspecified: Secondary | ICD-10-CM

## 2023-08-10 NOTE — Progress Notes (Signed)
 [facility] Counselor/Therapist Progress Note  Patient ID: KARLISSA ARON, MRN: [id number],    Date: 08/08/2023  Time Spent: 60 minutes Time in:  5:00  Time out: 6:00  Treatment Type: Individual Therapy  Reported Symptoms: The patient attended an individual therapy session.  The patient reports sadness and anxiety as her primary symptoms.  She is having trouble sleeping and also feeling lonely.  Mental Status Exam: Appearance:  Casual     Behavior: Appropriate  Motor: Normal  Speech/Language:  Normal Rate  Affect: blunted  Mood: pleasant  Thought process: normal  Thought content:   WNL  Sensory/Perceptual disturbances:   WNL  Orientation: oriented to person, place, time/date, and situation  Attention: Good  Concentration: Good  Memory: WNL  Fund of knowledge:  Fair  Insight:   Good  Judgment:  Poor  Impulse Control: Poor   Risk Assessment: Danger to Self:  No Self-injurious Behavior: No Danger to Others: No Duty to Warn:no Physical Aggression / Violence:No  Access to Firearms a concern: No  Gang Involvement:No   Subjective: The patient had an individual therapy session in the office today.  The patient presents as somewhat sad today.  The patient reports that she and her significant other have had some difficult times in the last few weeks and it seems that the situation is complicated because he tends to side with his children as opposed to siding with her and how to handle situations.  The patient has decided that she may end up getting out of the house again and we talked about her feelings about this and about whether she thought it would work for her to stay there or not.  She feels like she can do okay for a while but she reports that she is going to step back and not be so emotionally attached anymore.  We talked about her stating that moaning and deciding really what it is that she wants and they are going to some [place] counseling on the 21st of  this month and she reports that some of her decision will be based on how that goes.  At 1 point they had talked about marriage but they are continuing to have difficulties with the way they managed their children.  We talked about her continuing to manage her emotions and not be so reactive in anger and taking care of herself and finding things that make her happy.  Interventions: Cognitive Behavioral Therapy, Assertiveness/Communication, Psychologist, occupational, Insight-Oriented, and Interpersonal  Diagnosis:Adjustment disorder with mixed anxiety and depressed mood  PTSD (post-traumatic stress disorder)  Plan: Client Abilities/Strengths  Intelligent, insightful  Client Treatment Preferences  Outpatient Individual therapy  Client Statement of Needs  "I need some help with my anxiety and depression.  I also need help in learning how to be alone." Treatment Level  Outpatient Individual therapy  Symptoms  Excessive and/or unrealistic worry that is difficult to control occurring more days than not for at least 6  months about a number of events or activities.: No Description Entered (Status: maintained). Has been  exposed to a traumatic event involving actual or perceived threat of death or serious injury.: No  Description Entered (Status: maintained). Hypervigilance (e.g., feeling constantly on edge,  experiencing concentration difficulties, having trouble falling or staying asleep, exhibiting a general  state of irritability).: No Description Entered (Status: maintained). Impairment in social, occupational,  or other areas of functioning.: No Description Entered (Status: maintained).  Problems Addressed  Posttraumatic Stress Disorder (PTSD), Anxiety  Goals 1. Eliminate or reduce the negative impact trauma related symptoms have  on social, occupational, and family functioning. Objective Participate in Eye Movement Desensitization and Reprocessing (EMDR) to reduce emotional distress   related to traumatic thoughts, feelings, and images. Target Date: 04/19/2024 Frequency: Weekly  Progress: 0 Modality: individual Related Interventions 1. Utilize Eye Movement Desensitization and Reprocessing (EMDR) to reduce the client's  emotional reactivity to the traumatic event and reduce PTSD symptoms. 2. Stabilize anxiety level while increasing ability to function on a daily  basis. Objective Learn and implement problem-solving strategies for realistically addressing worries. Target Date: 04/19/2024 Frequency: Weekly Progress: 40 Modality: individual Related Interventions 1. Teach the client problem-solving strategies involving specifically defining a problem,  generating options for addressing it, evaluating the pros and cons of each option, selecting and  implementing an optional action, and reevaluating and refining the action (or assign "Applying  Problem-Solving to Interpersonal Conflict" in the Adult Psychotherapy Homework Planner by  Stephannie Li). Diagnosis F43.23  Adjustment Disorder with anxiety and depression F43.10 (Posttraumatic stress disorder) - Open - [Signifier: n/a] Posttraumatic Stress  Disorder  Conditions For Discharge Achievement of treatment goals and objectives   Buddy Loeffelholz G Collier Bohnet, LCSW

## 2023-08-14 ENCOUNTER — Encounter: Payer: Self-pay | Admitting: Internal Medicine

## 2023-08-14 ENCOUNTER — Ambulatory Visit (INDEPENDENT_AMBULATORY_CARE_PROVIDER_SITE_OTHER): Payer: Medicare Other | Admitting: Internal Medicine

## 2023-08-14 VITALS — BP 124/76 | HR 89 | Ht 65.0 in | Wt 202.0 lb

## 2023-08-14 DIAGNOSIS — H539 Unspecified visual disturbance: Secondary | ICD-10-CM | POA: Diagnosis not present

## 2023-08-14 NOTE — Progress Notes (Signed)
 Date:  08/14/2023   Name:  Joyce [name]   DOB:  [birth year]   MRN:  [id number]   Chief Complaint: Referral (Patient requesting a referral to an eye doctor, blurry vision, struggle to read up close, went to a local eye clinic and [facility] and was told she needed a referral by both places)  Eye Problem  Both eyes are affected. This is a chronic problem. The current episode started more than 1 month ago. The problem has been unchanged. The patient is experiencing no pain. Pertinent negatives include no eye discharge, fever or itching.  She has trouble reading books and the computer screen.  She tried otc readers but these give her a headache.  Review of Systems  Constitutional:  Negative for chills, diaphoresis and fever.  Eyes:  Positive for visual disturbance. Negative for pain, discharge and itching.  Respiratory:  Negative for chest tightness and shortness of breath.   Cardiovascular:  Negative for chest pain.  Genitourinary:  Negative for dysuria and urgency.  Psychiatric/Behavioral:  Positive for dysphoric mood. Negative for sleep disturbance. The patient is nervous/anxious.      Lab Results  Component Value Date   NA 139 07/20/2023   K 4.5 07/20/2023   CO2 23 07/20/2023   GLUCOSE 80 07/20/2023   BUN 8 07/20/2023   CREATININE 0.76 07/20/2023   CALCIUM 9.7 07/20/2023   EGFR 98 07/20/2023   GFRNONAA >60 03/28/2023   Lab Results  Component Value Date   CHOL 154 07/20/2023   HDL 54 07/20/2023   LDLCALC 89 07/20/2023   TRIG 55 07/20/2023   CHOLHDL 2.9 07/20/2023   Lab Results  Component Value Date   TSH 0.688 07/20/2023   Lab Results  Component Value Date   HGBA1C 5.8 (H) 07/20/2023   Lab Results  Component Value Date   WBC 7.4 07/20/2023   HGB 13.4 07/20/2023   HCT 42.4 07/20/2023   MCV 74 (L) 07/20/2023   PLT 363 07/20/2023   Lab Results  Component Value Date   ALT 16 07/20/2023   AST 19 07/20/2023   ALKPHOS 60 07/20/2023   BILITOT 0.4  07/20/2023   Lab Results  Component Value Date   VD25OH 33 02/15/2017     Patient Active Problem List   Diagnosis Date Noted   Cystic acne 03/01/2023   Sleep disorder 12/14/2022   Paresthesia of skin 09/18/2022   Mixed hyperlipidemia 09/18/2022   Primary osteoarthritis of knee 09/14/2022   Iron deficiency anemia 01/11/2022   Hamstring tendinitis at origin 10/13/2021   Lumbar radiculitis 06/23/2021   Left lateral epicondylitis 06/23/2021   Chronic left-sided low back pain without sciatica 07/01/2020   Neuropathic pain 05/06/2020   Anxiety state    Migraine without status migrainosus, not intractable    History of stroke 03/10/2020   Left hemiparesis (HCC) 03/10/2020   Recurrent falls 03/05/2020   Adjustment disorder with mixed anxiety and depressed mood 05/01/2017   PTSD (post-traumatic stress disorder) 05/01/2017   Essential hypertension 07/20/2016   Uterine fibroid 01/27/2016   Cervical high risk HPV (human papillomavirus) test positive 12/08/2014    No Known Allergies  Past Surgical History:  Procedure Laterality Date   CHOLECYSTECTOMY     COLONOSCOPY WITH PROPOFOL N/A 02/09/2022   Procedure: COLONOSCOPY WITH PROPOFOL;  Surgeon: Toney Reil, MD;  Location: [facility] ENDOSCOPY;  Service: Gastroenterology;  Laterality: N/A;    Social History   Tobacco Use   Smoking status: Never   Smokeless tobacco:  Never  Vaping Use   Vaping status: Never Used  Substance Use Topics   Alcohol use: Never   Drug use: No     Medication list has been reviewed and updated.  Current Meds  Medication Sig   amLODipine (NORVASC) 10 MG tablet Take 1 tablet (10 mg total) by mouth daily.   aspirin EC 81 MG tablet Take 81 mg by mouth daily. Swallow whole.   clindamycin (CLEOCIN T) 1 % external solution Apply topically 2 (two) times daily.   fluticasone (FLONASE) 50 MCG/ACT nasal spray Use 2 sprays in each nostril BID for a week. After 1 week, decrease to 1 spray in each nostril BID  as needed for congestion/allergies.   gabapentin (NEURONTIN) 600 MG tablet Take 1 tablet (600 mg total) by mouth 3 (three) times daily.   ibuprofen (ADVIL) 600 MG tablet Take by mouth.   lisinopril (ZESTRIL) 20 MG tablet Take 1 tablet (20 mg total) by mouth at bedtime.   rosuvastatin (CRESTOR) 10 MG tablet Take 1 tablet (10 mg total) by mouth daily.   tiZANidine (ZANAFLEX) 4 MG tablet Take 4 mg by mouth 2 (two) times daily.   topiramate ER (QUDEXY XR) 150 MG CS24 sprinkle capsule Take 1 capsule (150 mg total) by mouth at bedtime.   traZODone (DESYREL) 100 MG tablet Take 1 tablet (100 mg total) by mouth at bedtime.       08/14/2023    3:29 PM 07/10/2023   11:11 AM 04/18/2023    3:11 PM 03/01/2023    4:18 PM  GAD 7 : Generalized Anxiety Score  Nervous, Anxious, on Edge 1 2 2 3   Control/stop worrying 3 3 3 3   Worry too much - different things 3 3 3 3   Trouble relaxing 3 2 3 3   Restless 3 2 2 3   Easily annoyed or irritable 3 3 2 3   Afraid - awful might happen 3 2 3 3   Total GAD 7 Score 19 17 18 21   Anxiety Difficulty  Very difficult  Extremely difficult       08/14/2023    3:29 PM 07/10/2023   11:11 AM 06/14/2023    3:26 PM  Depression screen PHQ 2/9  Decreased Interest 2 2 1   Down, Depressed, Hopeless 3 3 1   PHQ - 2 Score 5 5 2   Altered sleeping 2 3   Tired, decreased energy 3 2   Change in appetite 2 1   Feeling bad or failure about yourself  3 3   Trouble concentrating 3 3   Moving slowly or fidgety/restless 2 1   Suicidal thoughts 0 0   PHQ-9 Score 20 18   Difficult doing work/chores  Very difficult     BP Readings from Last 3 Encounters:  08/14/23 124/76  07/10/23 (!) 150/86  06/14/23 (!) 157/95    Physical Exam Vitals and nursing note reviewed.  Constitutional:      General: She is not in acute distress.    Appearance: Normal appearance. She is well-developed.  HENT:     Head: Normocephalic and atraumatic.  Eyes:     General: Lids are normal.     Extraocular  Movements: Extraocular movements intact.     Conjunctiva/sclera: Conjunctivae normal.  Cardiovascular:     Rate and Rhythm: Normal rate and regular rhythm.  Pulmonary:     Effort: Pulmonary effort is normal. No respiratory distress.     Breath sounds: Normal breath sounds. No decreased breath sounds or wheezing.  Lymphadenopathy:  Cervical: No cervical adenopathy.  Skin:    General: Skin is warm and dry.     Findings: No rash.  Neurological:     Mental Status: She is alert and oriented to person, place, and time.  Psychiatric:        Mood and Affect: Mood normal.        Behavior: Behavior normal.     Wt Readings from Last 3 Encounters:  08/14/23 202 lb (91.6 kg)  07/10/23 204 lb (92.5 kg)  06/14/23 208 lb (94.3 kg)    BP 124/76   Pulse 89   Ht 5\' 5"  (1.651 m)   Wt 202 lb (91.6 kg)   LMP 08/10/2023 (Approximate)   SpO2 98%   BMI 33.61 kg/m   Assessment and Plan:  Problem List Items Addressed This Visit   None Visit Diagnoses       Vision changes    -  Primary   Needs eye exam and individualized correction I suggest she ask for workspace glasses for both reading and computer   Relevant Orders   Ambulatory referral to Ophthalmology       No follow-ups on file.    [name], MD [facility] [place]

## 2023-08-15 ENCOUNTER — Ambulatory Visit: Payer: Medicare Other | Admitting: Psychology

## 2023-08-16 ENCOUNTER — Encounter (HOSPITAL_COMMUNITY): Payer: Self-pay

## 2023-08-16 ENCOUNTER — Ambulatory Visit (HOSPITAL_COMMUNITY)
Admission: RE | Admit: 2023-08-16 | Discharge: 2023-08-16 | Disposition: A | Discharge status: Home / Self Care | Source: Ambulatory Visit | Attending: Internal Medicine | Admitting: Internal Medicine

## 2023-08-16 DIAGNOSIS — Z1231 Encounter for screening mammogram for malignant neoplasm of breast: Secondary | ICD-10-CM | POA: Diagnosis present

## 2023-08-21 ENCOUNTER — Encounter: Payer: Self-pay | Admitting: Internal Medicine

## 2023-08-21 ENCOUNTER — Ambulatory Visit (INDEPENDENT_AMBULATORY_CARE_PROVIDER_SITE_OTHER): Payer: Medicare Other | Admitting: Internal Medicine

## 2023-08-21 ENCOUNTER — Encounter: Payer: Self-pay | Admitting: Obstetrics & Gynecology

## 2023-08-21 VITALS — BP 125/79 | HR 83 | Ht 65.0 in | Wt 204.5 lb

## 2023-08-21 DIAGNOSIS — I10 Essential (primary) hypertension: Secondary | ICD-10-CM | POA: Diagnosis not present

## 2023-08-21 DIAGNOSIS — F4323 Adjustment disorder with mixed anxiety and depressed mood: Secondary | ICD-10-CM | POA: Diagnosis not present

## 2023-08-21 MED ORDER — FLUOXETINE HCL 10 MG PO TABS: 10.0000 mg | ORAL_TABLET | Freq: Every day | ORAL | 1 refills | Status: DC

## 2023-08-21 NOTE — Progress Notes (Signed)
 Date:  08/21/2023   Name:  Joyce [name]   DOB:  05-10-1976   MRN:  [id number]   Chief Complaint: Hypertension  Hypertension This is a chronic problem. The problem has been gradually improving since onset. The problem is controlled. Pertinent negatives include no chest pain, headaches, palpitations or shortness of breath. Past treatments include calcium channel blockers and ACE inhibitors.  Depression        This is a chronic problem.  The problem occurs daily.The problem is unchanged.  Associated symptoms include decreased concentration, insomnia, decreased interest and sad.  Associated symptoms include no fatigue, no myalgias, no headaches and no suicidal ideas.  Past treatments include psychotherapy (on trazodone nightly; seeing therapist regularly).  Compliance with treatment is good.   Review of Systems  Constitutional:  Negative for fatigue and unexpected weight change.  HENT:  Negative for trouble swallowing.   Eyes:  Negative for visual disturbance.  Respiratory:  Negative for cough, chest tightness, shortness of breath and wheezing.   Cardiovascular:  Negative for chest pain, palpitations and leg swelling.  Gastrointestinal:  Negative for abdominal pain, constipation and diarrhea.  Musculoskeletal:  Negative for arthralgias and myalgias.  Neurological:  Negative for dizziness, weakness, light-headedness and headaches.  Psychiatric/Behavioral:  Positive for decreased concentration and depression. Negative for suicidal ideas. The patient has insomnia.      Lab Results  Component Value Date   NA 139 07/20/2023   K 4.5 07/20/2023   CO2 23 07/20/2023   GLUCOSE 80 07/20/2023   BUN 8 07/20/2023   CREATININE 0.76 07/20/2023   CALCIUM 9.7 07/20/2023   EGFR 98 07/20/2023   GFRNONAA >60 03/28/2023   Lab Results  Component Value Date   CHOL 154 07/20/2023   HDL 54 07/20/2023   LDLCALC 89 07/20/2023   TRIG 55 07/20/2023   CHOLHDL 2.9 07/20/2023   Lab Results   Component Value Date   TSH 0.688 07/20/2023   Lab Results  Component Value Date   HGBA1C 5.8 (H) 07/20/2023   Lab Results  Component Value Date   WBC 7.4 07/20/2023   HGB 13.4 07/20/2023   HCT 42.4 07/20/2023   MCV 74 (L) 07/20/2023   PLT 363 07/20/2023   Lab Results  Component Value Date   ALT 16 07/20/2023   AST 19 07/20/2023   ALKPHOS 60 07/20/2023   BILITOT 0.4 07/20/2023   Lab Results  Component Value Date   VD25OH 33 02/15/2017     Patient Active Problem List   Diagnosis Date Noted   Cystic acne 03/01/2023   Sleep disorder 12/14/2022   Paresthesia of skin 09/18/2022   Mixed hyperlipidemia 09/18/2022   Primary osteoarthritis of knee 09/14/2022   Iron deficiency anemia 01/11/2022   Hamstring tendinitis at origin 10/13/2021   Lumbar radiculitis 06/23/2021   Left lateral epicondylitis 06/23/2021   Chronic left-sided low back pain without sciatica 07/01/2020   Neuropathic pain 05/06/2020   Anxiety state    Migraine without status migrainosus, not intractable    History of stroke 03/10/2020   Left hemiparesis (HCC) 03/10/2020   Recurrent falls 03/05/2020   Adjustment disorder with mixed anxiety and depressed mood 05/01/2017   PTSD (post-traumatic stress disorder) 05/01/2017   Essential hypertension 07/20/2016   Uterine fibroid 01/27/2016   Cervical high risk HPV (human papillomavirus) test positive 12/08/2014    No Known Allergies  Past Surgical History:  Procedure Laterality Date   CHOLECYSTECTOMY     COLONOSCOPY WITH PROPOFOL N/A 02/09/2022  Procedure: COLONOSCOPY WITH PROPOFOL;  Surgeon: Toney Reil, MD;  Location: [facility] ENDOSCOPY;  Service: Gastroenterology;  Laterality: N/A;    Social History   Tobacco Use   Smoking status: Never   Smokeless tobacco: Never  Vaping Use   Vaping status: Never Used  Substance Use Topics   Alcohol use: Never   Drug use: No     Medication list has been reviewed and updated.  Current Meds   Medication Sig   amLODipine (NORVASC) 10 MG tablet Take 1 tablet (10 mg total) by mouth daily.   aspirin EC 81 MG tablet Take 81 mg by mouth daily. Swallow whole.   clindamycin (CLEOCIN T) 1 % external solution Apply topically 2 (two) times daily.   FLUoxetine (PROZAC) 10 MG tablet Take 1 tablet (10 mg total) by mouth daily.   fluticasone (FLONASE) 50 MCG/ACT nasal spray Use 2 sprays in each nostril BID for a week. After 1 week, decrease to 1 spray in each nostril BID as needed for congestion/allergies.   gabapentin (NEURONTIN) 600 MG tablet Take 1 tablet (600 mg total) by mouth 3 (three) times daily.   ibuprofen (ADVIL) 600 MG tablet Take by mouth.   lisinopril (ZESTRIL) 20 MG tablet Take 1 tablet (20 mg total) by mouth at bedtime.   rosuvastatin (CRESTOR) 10 MG tablet Take 1 tablet (10 mg total) by mouth daily.   tiZANidine (ZANAFLEX) 4 MG tablet Take 4 mg by mouth 2 (two) times daily.   topiramate ER (QUDEXY XR) 150 MG CS24 sprinkle capsule Take 1 capsule (150 mg total) by mouth at bedtime.   traZODone (DESYREL) 100 MG tablet Take 1 tablet (100 mg total) by mouth at bedtime.       08/21/2023    3:55 PM 08/14/2023    3:29 PM 07/10/2023   11:11 AM 04/18/2023    3:11 PM  GAD 7 : Generalized Anxiety Score  Nervous, Anxious, on Edge 3 1 2 2   Control/stop worrying 3 3 3 3   Worry too much - different things 3 3 3 3   Trouble relaxing 3 3 2 3   Restless 3 3 2 2   Easily annoyed or irritable 3 3 3 2   Afraid - awful might happen 3 3 2 3   Total GAD 7 Score 21 19 17 18   Anxiety Difficulty   Very difficult        08/21/2023    3:55 PM 08/14/2023    3:29 PM 07/10/2023   11:11 AM  Depression screen PHQ 2/9  Decreased Interest 3 2 2   Down, Depressed, Hopeless 3 3 3   PHQ - 2 Score 6 5 5   Altered sleeping 3 2 3   Tired, decreased energy 3 3 2   Change in appetite 1 2 1   Feeling bad or failure about yourself  3 3 3   Trouble concentrating 3 3 3   Moving slowly or fidgety/restless 1 2 1   Suicidal  thoughts 0 0 0  PHQ-9 Score 20 20 18   Difficult doing work/chores   Very difficult    BP Readings from Last 3 Encounters:  08/21/23 125/79  08/14/23 124/76  07/10/23 (!) 150/86    Physical Exam Vitals and nursing note reviewed.  Constitutional:      General: She is not in acute distress.    Appearance: Normal appearance. She is well-developed.  HENT:     Head: Normocephalic and atraumatic.  Neck:     Vascular: No carotid bruit.  Cardiovascular:     Rate and Rhythm: Normal  rate and regular rhythm.     Heart sounds: No murmur heard. Pulmonary:     Effort: Pulmonary effort is normal. No respiratory distress.     Breath sounds: No wheezing or rhonchi.  Musculoskeletal:     Cervical back: Normal range of motion.  Lymphadenopathy:     Cervical: No cervical adenopathy.  Skin:    General: Skin is warm and dry.     Findings: No rash.  Neurological:     General: No focal deficit present.     Mental Status: She is alert and oriented to person, place, and time.  Psychiatric:        Mood and Affect: Mood normal.        Behavior: Behavior normal.     Wt Readings from Last 3 Encounters:  08/21/23 204 lb 8 oz (92.8 kg)  08/14/23 202 lb (91.6 kg)  07/10/23 204 lb (92.5 kg)    BP 125/79   Pulse 83   Ht 5\' 5"  (1.651 m)   Wt 204 lb 8 oz (92.8 kg)   LMP 08/10/2023 (Approximate)   SpO2 100%   BMI 34.03 kg/m   Assessment and Plan:  Problem List Items Addressed This Visit       Unprioritized   Essential hypertension - Primary (Chronic)   Blood pressure is well controlled.  Current medications amlodipine and lisinopril 20 mg (dose recently increased) Will continue same regimen along with efforts to limit dietary sodium.       Adjustment disorder with mixed anxiety and depressed mood   She is seeing therapist regularly who does not think she needs medication. However, she continues to have significant depressive symptoms. Will start Prozac 20 mg.  Follow up if no  improvement and with therapist regularly.      Relevant Medications   FLUoxetine (PROZAC) 10 MG tablet    Return in about 6 months (around 02/21/2024) for CPX.    [name], MD [facility] [place]

## 2023-08-21 NOTE — Assessment & Plan Note (Addendum)
 She is seeing therapist regularly who does not think she needs medication. However, she continues to have significant depressive symptoms. Will start Prozac 20 mg.  Follow up if no improvement and with therapist regularly.

## 2023-08-21 NOTE — Assessment & Plan Note (Signed)
 Blood pressure is well controlled.  Current medications amlodipine and lisinopril 20 mg (dose recently increased) Will continue same regimen along with efforts to limit dietary sodium.

## 2023-08-22 ENCOUNTER — Other Ambulatory Visit: Payer: Self-pay | Admitting: Internal Medicine

## 2023-08-22 ENCOUNTER — Telehealth: Payer: Self-pay

## 2023-08-22 DIAGNOSIS — F4323 Adjustment disorder with mixed anxiety and depressed mood: Secondary | ICD-10-CM

## 2023-08-22 MED ORDER — FLUOXETINE HCL 10 MG PO CAPS: 10.0000 mg | ORAL_CAPSULE | Freq: Every day | ORAL | 1 refills | Status: DC

## 2023-08-22 NOTE — Telephone Encounter (Signed)
 [facility] pharmacy is requesting a clarification for Fluoxetine 10 MG, insurance does not cover TABLETS, would like to get Rx for CAPSULES.

## 2023-08-22 NOTE — Telephone Encounter (Signed)
 Spoke with patient, informed her of the change. She verbalized understanding.

## 2023-08-23 ENCOUNTER — Ambulatory Visit (INDEPENDENT_AMBULATORY_CARE_PROVIDER_SITE_OTHER): Payer: Medicare Other | Admitting: Psychology

## 2023-08-23 DIAGNOSIS — F4310 Post-traumatic stress disorder, unspecified: Secondary | ICD-10-CM | POA: Diagnosis not present

## 2023-08-23 DIAGNOSIS — F4323 Adjustment disorder with mixed anxiety and depressed mood: Secondary | ICD-10-CM | POA: Diagnosis not present

## 2023-08-23 NOTE — Progress Notes (Signed)
 [facility] Counselor/Therapist Progress Note  Patient ID: Joyce [name], MRN: [id number],    Date: 08/23/2023  Time Spent: 60 minutes Time in:  5:05  Time out: 6:05  Treatment Type: Individual Therapy  Reported Symptoms: The patient attended an individual therapy session.  The patient reports sadness and anxiety as her primary symptoms.  She is having trouble sleeping and also feeling lonely.  Mental Status Exam: Appearance:  Casual     Behavior: Appropriate  Motor: Normal  Speech/Language:  Normal Rate  Affect: blunted  Mood: pleasant  Thought process: normal  Thought content:   WNL  Sensory/Perceptual disturbances:   WNL  Orientation: oriented to person, place, time/date, and situation  Attention: Good  Concentration: Good  Memory: WNL  Fund of knowledge:  Fair  Insight:   Good  Judgment:  Poor  Impulse Control: Poor   Risk Assessment: Danger to Self:  No Self-injurious Behavior: No Danger to Others: No Duty to Warn:no Physical Aggression / Violence:No  Access to Firearms a concern: No  Gang Involvement:No   Subjective: The patient had an individual therapy session in the office today.  The patient presents as somewhat sad and has a blunted affect today.  The patient reports that she and Leveille have not been really getting along very well.  She reports that they have been arguing about the kids and she does seem to have some insight into her contributions to the problem.  We talked about the things that she needs to work on and she seems to be trying to make a decision about whether she is going to stay in the relationship or not.  It does seem that they are split by his kids and her kids and they are struggling with that.  We talked about how she can manage and keep herself calm and she has been looking to move at some point.  We will continue to explore what her options are and she feels that she is just going to have to manage for right now because  she does not have the money to be able to get out of the situation.  It seems that it is still a little better than it was before she left the last time but it is not as her feeling as she wants.  Interventions: Cognitive Behavioral Therapy, Assertiveness/Communication, Psychologist, occupational, Insight-Oriented, and Interpersonal  Diagnosis:Adjustment disorder with mixed anxiety and depressed mood  PTSD (post-traumatic stress disorder)  Plan: Client Abilities/Strengths  Intelligent, insightful  Client Treatment Preferences  Outpatient Individual therapy  Client Statement of Needs  "I need some help with my anxiety and depression.  I also need help in learning how to be alone." Treatment Level  Outpatient Individual therapy  Symptoms  Excessive and/or unrealistic worry that is difficult to control occurring more days than not for at least 6  months about a number of events or activities.: No Description Entered (Status: maintained). Has been  exposed to a traumatic event involving actual or perceived threat of death or serious injury.: No  Description Entered (Status: maintained). Hypervigilance (e.g., feeling constantly on edge,  experiencing concentration difficulties, having trouble falling or staying asleep, exhibiting a general  state of irritability).: No Description Entered (Status: maintained). Impairment in social, occupational,  or other areas of functioning.: No Description Entered (Status: maintained).  Problems Addressed  Posttraumatic Stress Disorder (PTSD), Anxiety  Goals 1. Eliminate or reduce the negative impact trauma related symptoms have  on social, occupational, and family functioning.  Objective Participate in Eye Movement Desensitization and Reprocessing (EMDR) to reduce emotional distress  related to traumatic thoughts, feelings, and images. Target Date: 04/19/2024 Frequency: Weekly  Progress: 0 Modality: individual Related Interventions 1. Utilize Eye  Movement Desensitization and Reprocessing (EMDR) to reduce the client's  emotional reactivity to the traumatic event and reduce PTSD symptoms. 2. Stabilize anxiety level while increasing ability to function on a daily  basis. Objective Learn and implement problem-solving strategies for realistically addressing worries. Target Date: 04/19/2024 Frequency: Weekly Progress: 40 Modality: individual Related Interventions 1. Teach the client problem-solving strategies involving specifically defining a problem,  generating options for addressing it, evaluating the pros and cons of each option, selecting and  implementing an optional action, and reevaluating and refining the action (or assign "Applying  Problem-Solving to Interpersonal Conflict" in the Adult Psychotherapy Homework Planner by  Stephannie Li). Diagnosis F43.23  Adjustment Disorder with anxiety and depression F43.10 (Posttraumatic stress disorder) - Open - [Signifier: n/a] Posttraumatic Stress  Disorder  Conditions For Discharge Achievement of treatment goals and objectives   Niah Heinle G Rocco Kerkhoff, LCSW

## 2023-10-10 NOTE — Progress Notes (Deleted)
 Subjective:    Patient ID: Joyce [name], female    DOB: [birth year], 48 y.o.   MRN: [id number]  HPI   Pain Inventory Average Pain {NUMBERS; 0-10:5044} Pain Right Now {NUMBERS; 0-10:5044} My pain is {PAIN DESCRIPTION:21022940}  LOCATION OF PAIN  ***  BOWEL Number of stools per week: *** Oral laxative use {YES/NO:21197} Type of laxative *** Enema or suppository use {YES/NO:21197} History of colostomy {YES/NO:21197} Incontinent {YES/NO:21197}  BLADDER {bladder options:24190} In and out cath, frequency *** Able to self cath {YES/NO:21197} Bladder incontinence {YES/NO:21197} Frequent urination {YES/NO:21197} Leakage with coughing {YES/NO:21197} Difficulty starting stream {YES/NO:21197} Incomplete bladder emptying {YES/NO:21197}   Mobility {MOBILITY WUJ:81191478}  Function {FUNCTION:21022946}  Neuro/Psych {NEURO/PSYCH:21022948}  Prior Studies {CPRM PRIOR STUDIES:21022953}  Physicians involved in your care {CPRM PHYSICIANS INVOLVED IN YOUR CARE:21022954}   Family History  Problem Relation Age of Onset   Diabetes Mother    Hypertension Mother    Kidney disease Mother    Asthma Mother    Hyperlipidemia Mother    Alzheimer's disease Father    Diabetes Father    Kidney disease Sister    Asthma Son    Diabetes Sister    Asthma Son    ADD / ADHD Son    Heart murmur Son    Asthma Son    Colon cancer Neg Hx    Colon polyps Neg Hx    Social History   Socioeconomic History   Marital status: Single    Spouse name: Hotel manager Love   Number of children: 8   Years of education: 9   Highest education level: Not on file  Occupational History   Occupation: unemployed    Comment: disabled  Tobacco Use   Smoking status: Never   Smokeless tobacco: Never  Vaping Use   Vaping status: Never Used  Substance and Sexual Activity   Alcohol use: Never   Drug use: No   Sexual activity: Not Currently    Birth control/protection: I.U.D.    Comment: followed by  GYN  Other Topics Concern   Not on file  Social History Narrative   Lives with fiance and kids   Right Handed   Drinks >12 cans of soda in caffeine    Social Drivers of Health   Financial Resource Strain: High Risk (04/18/2023)   Overall Financial Resource Strain (CARDIA)    Difficulty of Paying Living Expenses: Very hard  Food Insecurity: Food Insecurity Present (04/18/2023)   Hunger Vital Sign    Worried About Running Out of Food in the Last Year: Sometimes true    Ran Out of Food in the Last Year: Never true  Transportation Needs: No Transportation Needs (04/18/2023)   PRAPARE - Administrator, Civil Service (Medical): No    Lack of Transportation (Non-Medical): No  Physical Activity: Sufficiently Active (04/18/2023)   Exercise Vital Sign    Days of Exercise per Week: 5 days    Minutes of Exercise per Session: 40 min  Stress: Stress Concern Present (04/18/2023)   [employer] of Occupational Health - Occupational Stress Questionnaire    Feeling of Stress : Very much  Social Connections: Unknown (04/18/2023)   Social Connection and Isolation Panel [NHANES]    Frequency of Communication with Friends and Family: Never    Frequency of Social Gatherings with Friends and Family: Never    Attends Religious Services: More than 4 times per year    Active Member of [employer] or Organizations: No    Attends Banker  Meetings: Never    Marital Status: Patient declined   Past Surgical History:  Procedure Laterality Date   CHOLECYSTECTOMY     COLONOSCOPY WITH PROPOFOL  N/A 02/09/2022   Procedure: COLONOSCOPY WITH PROPOFOL ;  Surgeon: Selena Daily, MD;  Location: [facility] ENDOSCOPY;  Service: Gastroenterology;  Laterality: N/A;   Past Medical History:  Diagnosis Date   Acute CVA (cerebrovascular accident) (HCC) 03/04/2020   Acute left-sided weakness 03/05/2020   Anemia    Depression    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  Opioid Risk Score:   Fall Risk Score:  `1  Depression screen [facility] 2/9     08/21/2023    3:55 PM 08/14/2023    3:29 PM 07/10/2023   11:11 AM 06/14/2023    3:26 PM 04/18/2023    3:11 PM 03/01/2023    4:17 PM 01/06/2023    3:49 PM  Depression screen PHQ 2/9  Decreased Interest 3 2 2 1 3 1 1   Down, Depressed, Hopeless 3 3 3 1 3 1 1   PHQ - 2 Score 6 5 5 2 6 2 2   Altered sleeping 3 2 3  2 1 3   Tired, decreased energy 3 3 2  3 1 3   Change in appetite 1 2 1  2 3 3   Feeling bad or failure about yourself  3 3 3  3 1 2   Trouble concentrating 3 3 3  3 1 2   Moving slowly or fidgety/restless 1 2 1  1  0 0  Suicidal thoughts 0 0 0  0 0 0  PHQ-9 Score 20 20 18  20 9 15   Difficult doing work/chores   Very difficult   Somewhat difficult Somewhat difficult    Review of Systems     Objective:   Physical Exam        Assessment & Plan:

## 2023-10-11 ENCOUNTER — Ambulatory Visit: Admitting: Psychology

## 2023-10-11 ENCOUNTER — Encounter: Payer: Medicare Other | Admitting: Physical Medicine & Rehabilitation

## 2023-10-17 ENCOUNTER — Ambulatory Visit: Admitting: Psychology

## 2023-10-18 ENCOUNTER — Encounter: Attending: Physical Medicine & Rehabilitation | Admitting: Physical Medicine & Rehabilitation

## 2023-10-18 ENCOUNTER — Encounter: Payer: Self-pay | Admitting: Physical Medicine & Rehabilitation

## 2023-10-18 VITALS — BP 128/84 | HR 83 | Ht 65.0 in | Wt 205.0 lb

## 2023-10-18 DIAGNOSIS — G5621 Lesion of ulnar nerve, right upper limb: Secondary | ICD-10-CM | POA: Diagnosis present

## 2023-10-18 DIAGNOSIS — M7711 Lateral epicondylitis, right elbow: Secondary | ICD-10-CM | POA: Insufficient documentation

## 2023-10-18 MED ORDER — METHYLPREDNISOLONE 4 MG PO TBPK: ORAL_TABLET | ORAL | 0 refills | Status: DC

## 2023-10-18 NOTE — Patient Instructions (Signed)
 ALWAYS FEEL FREE TO CALL OUR OFFICE WITH ANY PROBLEMS OR QUESTIONS [phone])  **PLEASE NOTE** ALL MEDICATION REFILL REQUESTS (INCLUDING CONTROLLED SUBSTANCES) NEED TO BE MADE AT LEAST 7 DAYS PRIOR TO REFILL BEING DUE. ANY REFILL REQUESTS INSIDE THAT TIME FRAME MAY RESULT IN DELAYS IN RECEIVING YOUR PRESCRIPTION.                    KEEP RIGHT ELBOW EXTENDED WHEN YOU SLEEP OR WHILE YOU'RE SITTING IN A CHAIR.   ELBOW PAD OR CUSHIONING BENEATH ELBOW

## 2023-10-18 NOTE — Progress Notes (Signed)
 Subjective:    Patient ID: Joyce [name], female    DOB: 04-Aug-1975, 48 y.o.   MRN: [id number]  HPI  Ms Jessop is here in follow up of her right thalamic infarct. She is having pain in her right elbow. It wakes her up at night. The pain is tingling and burning.  She often sleeps with her elbow behind her bed for with her arm bent across her chest and waist.  She has some slight padding with the elbow pad but nothing significant.  She does use some heat and ice with benefit.  Voltaren  gel has helped before also.  Her left sided dysesthetic pain is generally better.  She is on gabapentin  600 mg 3 times daily.  From the standpoint of her back and right knee she has had improvement also in large part due to her weight loss.  Pain Inventory Average Pain 10 Pain Right Now 10 My pain is constant, sharp, and stabbing  In the last 24 hours, has pain interfered with the following? General activity 10 Relation with others 2 Enjoyment of life 10 What TIME of day is your pain at its worst? morning , daytime, evening, night, and varies Sleep (in general) Poor  Pain is worse with: some activites Pain improves with: rest Relief from Meds: 0  Family History  Problem Relation Age of Onset   Diabetes Mother    Hypertension Mother    Kidney disease Mother    Asthma Mother    Hyperlipidemia Mother    Alzheimer's disease Father    Diabetes Father    Kidney disease Sister    Asthma Son    Diabetes Sister    Asthma Son    ADD / ADHD Son    Heart murmur Son    Asthma Son    Colon cancer Neg Hx    Colon polyps Neg Hx    Social History   Socioeconomic History   Marital status: Single    Spouse name: Hotel manager Love   Number of children: 8   Years of education: 9   Highest education level: Not on file  Occupational History   Occupation: unemployed    Comment: disabled  Tobacco Use   Smoking status: Never   Smokeless tobacco: Never  Vaping Use   Vaping status: Never Used   Substance and Sexual Activity   Alcohol use: Never   Drug use: No   Sexual activity: Not Currently    Birth control/protection: I.U.D.    Comment: followed by GYN  Other Topics Concern   Not on file  Social History Narrative   Lives with fiance and kids   Right Handed   Drinks >12 cans of soda in caffeine    Social Drivers of Health   Financial Resource Strain: High Risk (04/18/2023)   Overall Financial Resource Strain (CARDIA)    Difficulty of Paying Living Expenses: Very hard  Food Insecurity: Food Insecurity Present (04/18/2023)   Hunger Vital Sign    Worried About Running Out of Food in the Last Year: Sometimes true    Ran Out of Food in the Last Year: Never true  Transportation Needs: No Transportation Needs (04/18/2023)   PRAPARE - Administrator, Civil Service (Medical): No    Lack of Transportation (Non-Medical): No  Physical Activity: Sufficiently Active (04/18/2023)   Exercise Vital Sign    Days of Exercise per Week: 5 days    Minutes of Exercise per Session: 40 min  Stress: Stress Concern  Present (04/18/2023)   [employer] of Occupational Health - Occupational Stress Questionnaire    Feeling of Stress : Very much  Social Connections: Unknown (04/18/2023)   Social Connection and Isolation Panel [NHANES]    Frequency of Communication with Friends and Family: Never    Frequency of Social Gatherings with Friends and Family: Never    Attends Religious Services: More than 4 times per year    Active Member of Clubs or Organizations: No    Attends Banker Meetings: Never    Marital Status: Patient declined   Past Surgical History:  Procedure Laterality Date   CHOLECYSTECTOMY     COLONOSCOPY WITH PROPOFOL  N/A 02/09/2022   Procedure: COLONOSCOPY WITH PROPOFOL ;  Surgeon: Selena Daily, MD;  Location: [facility] ENDOSCOPY;  Service: Gastroenterology;  Laterality: N/A;   Past Surgical History:  Procedure Laterality Date   CHOLECYSTECTOMY      COLONOSCOPY WITH PROPOFOL  N/A 02/09/2022   Procedure: COLONOSCOPY WITH PROPOFOL ;  Surgeon: Selena Daily, MD;  Location: [facility] ENDOSCOPY;  Service: Gastroenterology;  Laterality: N/A;   Past Medical History:  Diagnosis Date   Acute CVA (cerebrovascular accident) (HCC) 03/04/2020   Acute left-sided weakness 03/05/2020   Anemia    Depression    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   Hypertension    Lumbar radiculitis 06/23/2021   Migraine    PTSD (post-traumatic stress disorder) 05/01/2017   Round ligament pain 11/12/2014   BP 128/84 (BP Location: Left Arm, Patient Position: Sitting)   Pulse 83   Ht 5\' 5"  (1.651 m)   Wt 205 lb (93 kg)   SpO2 97%   BMI 34.11 kg/m   Opioid Risk Score:   Fall Risk Score:  `1  Depression screen [facility] 2/9     10/18/2023    3:42 PM 08/21/2023    3:55 PM 08/14/2023    3:29 PM 07/10/2023   11:11 AM 06/14/2023    3:26 PM 04/18/2023    3:11 PM 03/01/2023    4:17 PM  Depression screen PHQ 2/9  Decreased Interest 0 3 2 2 1 3 1   Down, Depressed, Hopeless 0 3 3 3 1 3 1   PHQ - 2 Score 0 6 5 5 2 6 2   Altered sleeping  3 2 3  2 1   Tired, decreased energy  3 3 2  3 1   Change in appetite  1 2 1  2 3   Feeling bad or failure about yourself   3 3 3  3 1   Trouble concentrating  3 3 3  3 1   Moving slowly or fidgety/restless  1 2 1  1  0  Suicidal thoughts  0 0 0  0 0  PHQ-9 Score  20 20 18  20 9   Difficult doing work/chores    Very difficult   Somewhat difficult     Review of Systems  All other systems reviewed and are negative.      Objective:   Physical Exam  General: No acute distress HEENT: NCAT, EOMI, oral membranes moist Cards: reg rate  Chest: normal effort Abdomen: Soft, NT, ND Skin: dry, intact Extremities: no edema Psych: pleasant and appropriate  Neuro Alert and oriented x 3. Normal insight and awareness. Intact Memory. Normal language and speech. Cranial nerve exam unremarkable except for mild left facial sensory loss ongoing.  Sensory 1+  to 2 -/2 LUE and LLE remains present Motor 4+/5 on left 5/5 on right.  Weight shift improved.  Musculoskeletal: Patient with mild pain over  the common extensor tendon of the right elbow. Now has more pain around ulnar groove. +tinel sign. . Ambulation improved, minimal antalgia  Assessment & Plan:  1.  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  She stays active.  Her weight loss has helped a lot WITH her back and knee pain.             2.  Pain management/history of migraines               -Patient is on gabapentin   600mg  tid 3.  Right knee pain: doing much better             -Diclofenac  gel             -supps for OA have helped             -knee strengthening exercise, weight loss             -ice             4.  Spasticity: improved. 5.  Hypertension: Increased Norvasc  to 10 mg daily             - continue lisinopril  5mg  qhs 6. Low back pain-patient has spondylosis and facet arthropathy on her most recent x-ray from last year.  MRI with stenosis and facet arthropathy at L4-5.  I suspect that she has had radiculopathy in her legs but that a lot of that has improved.  There may be some residual motor and sensory loss in her foot along the L5 dermatome.    -improved, observe for now.    7.  Right elbow pain consistent with lateral epicondylitis initially but more c/w ulnar nerve entrapment now.              --elbow sleeve with pad for elbow, keep elbow in extension especially at night             -Ice 3 times a day as needed for pain             -continue Diclofenac  gel 3 times daily   -steroid po taper  -consider ncs/emg  30 minutes of face to face patient care time were spent during this visit. All questions were encouraged and answered. Follow up with me in 2 months.

## 2023-10-24 ENCOUNTER — Ambulatory Visit (INDEPENDENT_AMBULATORY_CARE_PROVIDER_SITE_OTHER): Admitting: Psychology

## 2023-10-24 DIAGNOSIS — F4310 Post-traumatic stress disorder, unspecified: Secondary | ICD-10-CM | POA: Diagnosis not present

## 2023-10-24 DIAGNOSIS — F4323 Adjustment disorder with mixed anxiety and depressed mood: Secondary | ICD-10-CM

## 2023-10-24 NOTE — Progress Notes (Signed)
 [facility] Counselor/Therapist Progress Note  Patient ID: Joyce [name], MRN: [id number],    Date: 10/24/2023  Time Spent: 45 minutes Time in:  5:10  Time out: 5:55  Treatment Type: Individual Therapy  Reported Symptoms: The patient attended an individual therapy session.  The patient reports sadness and anxiety as her primary symptoms.  She is having trouble sleeping and also feeling lonely.  Mental Status Exam: Appearance:  Casual     Behavior: Appropriate  Motor: Normal  Speech/Language:  Normal Rate  Affect: blunted  Mood: pleasant  Thought process: normal  Thought content:   WNL  Sensory/Perceptual disturbances:   WNL  Orientation: oriented to person, place, time/date, and situation  Attention: Good  Concentration: Good  Memory: WNL  Fund of knowledge:  Fair  Insight:   Good  Judgment:  Poor  Impulse Control: Poor   Risk Assessment: Danger to Self:  No Self-injurious Behavior: No Danger to Others: No Duty to Warn:no Physical Aggression / Violence:No  Access to Firearms a concern: No  Gang Involvement:No   Subjective: The patient had an individual therapy session in the office today however she asked to do the session by phone.  We talked about how things are going and she reports that things are much better now than they were.  She states that her significant other Birt Bulla is driving a truck now so he is gone most of the week.  She also reports that his oldest daughter had to move out because they had a big argument and that seems to  have made things better in the household.  We talked about some of the things that brought her to therapy and I talked with her and reminded her to not have unrealistic expectations, to make sure she is setting limits and boundaries, to pay attention to her anger and her temper, and to have conversations and communicate.  The patient will graduate today from therapy. Interventions: Cognitive Behavioral Therapy,  Assertiveness/Communication, Psychologist, occupational, Insight-Oriented, and Interpersonal  Diagnosis:Adjustment disorder with mixed anxiety and depressed mood  PTSD (post-traumatic stress disorder)  Plan: Client Abilities/Strengths  Intelligent, insightful  Client Treatment Preferences  Outpatient Individual therapy  Client Statement of Needs  "I need some help with my anxiety and depression.  I also need help in learning how to be alone." Treatment Level  Outpatient Individual therapy  Symptoms  Excessive and/or unrealistic worry that is difficult to control occurring more days than not for at least 6  months about a number of events or activities.: No Description Entered (Status: maintained). Has been  exposed to a traumatic event involving actual or perceived threat of death or serious injury.: No  Description Entered (Status: maintained). Hypervigilance (e.g., feeling constantly on edge,  experiencing concentration difficulties, having trouble falling or staying asleep, exhibiting a general  state of irritability).: No Description Entered (Status: maintained). Impairment in social, occupational,  or other areas of functioning.: No Description Entered (Status: maintained).  Problems Addressed  Posttraumatic Stress Disorder (PTSD), Anxiety  Goals 1. Eliminate or reduce the negative impact trauma related symptoms have  on social, occupational, and family functioning. Objective Participate in Eye Movement Desensitization and Reprocessing (EMDR) to reduce emotional distress  related to traumatic thoughts, feelings, and images. Target Date: 04/19/2024 Frequency: Weekly  Progress: 0 Modality: individual Related Interventions 1. Utilize Eye Movement Desensitization and Reprocessing (EMDR) to reduce the client's  emotional reactivity to the traumatic event and reduce PTSD symptoms. 2. Stabilize anxiety level while increasing ability  to function on a daily  basis. Objective Learn and  implement problem-solving strategies for realistically addressing worries. Target Date: 04/19/2024 Frequency: Weekly Progress: 60 Modality: individual Related Interventions 1. Teach the client problem-solving strategies involving specifically defining a problem,  generating options for addressing it, evaluating the pros and cons of each option, selecting and  implementing an optional action, and reevaluating and refining the action (or assign "Applying  Problem-Solving to Interpersonal Conflict" in the Adult Psychotherapy Homework Planner by  Beacher Bottoms). Diagnosis F43.23  Adjustment Disorder with anxiety and depression F43.10 (Posttraumatic stress disorder) - Open - [Signifier: n/a] Posttraumatic Stress  Disorder  Conditions For Discharge Achievement of treatment goals and objectives   Toriano Aikey G Nycole Kawahara, LCSW

## 2023-11-15 ENCOUNTER — Ambulatory Visit (INDEPENDENT_AMBULATORY_CARE_PROVIDER_SITE_OTHER): Payer: Medicare Other | Admitting: Adult Health

## 2023-11-15 ENCOUNTER — Encounter: Payer: Self-pay | Admitting: Adult Health

## 2023-11-15 VITALS — BP 132/81 | HR 78 | Ht 65.0 in | Wt 209.0 lb

## 2023-11-15 DIAGNOSIS — M5441 Lumbago with sciatica, right side: Secondary | ICD-10-CM

## 2023-11-15 DIAGNOSIS — G43109 Migraine with aura, not intractable, without status migrainosus: Secondary | ICD-10-CM

## 2023-11-15 DIAGNOSIS — G8929 Other chronic pain: Secondary | ICD-10-CM

## 2023-11-15 DIAGNOSIS — I6381 Other cerebral infarction due to occlusion or stenosis of small artery: Secondary | ICD-10-CM

## 2023-11-15 DIAGNOSIS — E782 Mixed hyperlipidemia: Secondary | ICD-10-CM | POA: Diagnosis not present

## 2023-11-15 MED ORDER — ROSUVASTATIN CALCIUM 20 MG PO TABS: 20.0000 mg | ORAL_TABLET | Freq: Every day | ORAL | 5 refills | Status: AC

## 2023-11-15 MED ORDER — TOPIRAMATE ER 150 MG PO SPRINKLE CAP24: 150.0000 mg | EXTENDED_RELEASE_CAPSULE | Freq: Every evening | ORAL | 11 refills | Status: AC

## 2023-11-15 MED ORDER — NURTEC 75 MG PO TBDP: 75.0000 mg | ORAL_TABLET | ORAL | 11 refills | Status: AC | PRN

## 2023-11-15 NOTE — Progress Notes (Signed)
 [facility] [address] [place]. [place] [address] [phone]       OFFICE FOLLOW-UP VISIT NOTE  [name] Date of Birth:  [birth year] Medical Record Number:  [id number]    Primary neurologist: Dr. Janett Medin Reason for visit: hx of stroke, migraines   Chief Complaint  Patient presents with   Migraine    Rm 8 alone  Pt is well and stable, reports no new stroke concerns.  Migraines are stable, has maybe one a week. Increases during menstrual cycle       HPI:   Update 11/15/2023 JM: Patient returns for follow-up visit.   Remains stable from stroke standpoint without new stroke/TIA symptoms.  Reports continued mild LLE weakness which has been stable.  No recent falls, no use of AD.  Continues to follow with PMR, remains on gabapentin  600 mg TID for dysesthesias.  Reports compliance on aspirin  and Crestor .  Routinely followed by PCP for stroke risk factor management.  Prior lab work 07/2023 showed LDL 89 and A1c 5.8.  At prior visit, increased Qudexy  to 150 mg nightly, reports improvement of migraines, currently occurring about once per week but can be more frequent around menstrual cycle, can also experience dizziness around that time.  Typically takes Tylenol  as needed.  Has not previously tried Nurtec or [place].        History provided for reference purposes only Update 05/02/2023 JM: Returns for follow-up visit.  Stable from stroke standpoint, continued mild LLE weakness and occasional numbness but overall stable.  Ambulates without AD, no recent falls. Can occasionally have tremor in left arm and leg with muscle activation but able to stop tremor by holding extremity.  Denies new stroke/TIA symptoms.  Remains on aspirin  and Crestor .  Blood pressure elevated today, routinely monitored at home and reports BP can fluctuate since her stroke although per epic review from recent follow up visits, BP has been satisfactory.  Routinely follows with PCP for  stroke risk factor management.  Currently experiencing about 1-2 migraines per week, notes some improvement with increasing Qudexy  to 100mg  nightly, tolerating without side effects. Will use Tylenol  as needed with benefit, denies overuse.  Continues to have right sided sciatica, Dr. Rachel Budds switched her from pregabalin  back to gabapentin  end of October which she feels works better, takes the edge off but symptoms still persistent.  She was referred to neurosurgery after MRI lumbar spine showed L4-5 severe facet hypertrophy and moderate bilateral foraminal narrowing resulting in possible encroachment on exiting L5 nerve root bilaterally. She reports she was never called to neurosurgery evaluation.  Update 01/03/2023 Dr. Janett Medin; Patient returns for follow-up after last visit 2 and half years ago.  She states she is doing well from the stroke standpoint without recurrent stroke or TIA symptoms.  She had some left-sided weakness and clumsiness which appears to have improved.  She remains on aspirin  which is tolerating well without bruising or bleeding.  She states her blood pressure is under good control.  She is tolerating Crestor  well without muscle aches and pains.  She has a new complaint of right hip and leg pain.  This has been going on for the last year and a half.  The pain is intermittent described as sharp shooting going down the back of her buttock into the lateral aspect of the right thigh up to the knee.  She has seen Dr. Emelie Hanger from rehab close prescribed some Lyrica  and she has been taking 50 mg 3 times daily for  the last couple of months without much relief.  She has not tried higher dose yet.  She has not had any recent back x-rays or MRI done.  She continues to have migraines which she sees still not well-controlled.  They occur 2 or 3 times a week.  She takes Topamax  XR 50 mg at night and is tolerating it well without side effects.  She has not tried a higher dose yet.  She was seen in the  emergency room on 09/18/2022 for headache and left-sided numbness.  MRI scan of the brain was obtained which showed no acute abnormality.  Echocardiogram showed ejection fraction of 55 to 60%.  Hemoglobin A1c was 5.5 and LDL cholesterol was 68 mg percent.  She was treated with a migraine cocktail and thought to have her typical migraine symptoms resolved.  Initial visit 08/06/2020 Dr. Janett Medin Ms. Berens is a 48 year old pleasant African-American lady seen today for initial office consultation visit for stroke.  History is obtained from the patient, review of electronic medical records and I personally reviewed pertinent available imaging films in PACS.  She has past medical history of hypertension, hyperlipidemia, depression, migraines who presented initially initially on 03/04/2020 with sudden onset of left-sided weakness and numbness.  She presented outside time window for TPA. She was seen by telemetry specialist at [facility] and NIH stroke scale was 3.  MRI scan of the brain showed a small 8 mm right lateral thalamic acute lacunar infarct.  There are mild changes of small vessel disease.  MR angiogram of the brain showed no significant large vessel stenosis.  Carotid ultrasound showed no significant extracranial stenosis.  Transthoracic echo showed normal ejection fraction of 60 to 65% without cardiac source of embolism.  LDL cholesterol was 96 mg percent and hemoglobin A1c was 5.6.  ANA panel was negative antiphospholipid antibodies were negative.  Homocysteine level was normal.  RPR was negative.  ESR was normal.  Patient was started on aspirin  Plavix  for 3 weeks and subsequently switched to aspirin  alone.  She did well with the regaining strength on the left side but has residual paresthesias still.  She is currently doing outpatient physical occupational therapy.  She returned to the ER on 05/19/2020 with headache as well as back pain.  Repeat MRI of the brain showed no acute abnormality.  Patient  states she remains on aspirin  she is tolerating well without bleeding or bruising.  Blood pressures well controlled today it is 133/81.  She was on hormonal injections 3 times a week at the time of the stroke since then her gynecologist/changes to hormonal implant.  Her migraines are much improved and now occur around once a month or so.  She is on low-dose Topamax  25 mg daily for migraine prevention and tolerating it well without side effects.  She does admit to snoring and having disturbed sleep but she has never been evaluated for sleep apnea      ROS:  14 system review of systems is positive for those listed in HPI and in all other systems negative  PMH:  Past Medical History:  Diagnosis Date   Acute CVA (cerebrovascular accident) (HCC) 03/04/2020   Acute left-sided weakness 03/05/2020   Anemia    Depression    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   Hypertension    Lumbar radiculitis 06/23/2021   Migraine    PTSD (post-traumatic stress disorder) 05/01/2017   Round ligament pain 11/12/2014    Social History:  Social History   Socioeconomic History  Marital status: Single    Spouse name: [name]   Number of children: 8   Years of education: 9   Highest education level: Not on file  Occupational History   Occupation: unemployed    Comment: disabled  Tobacco Use   Smoking status: Never   Smokeless tobacco: Never  Vaping Use   Vaping status: Never Used  Substance and Sexual Activity   Alcohol use: Never   Drug use: No   Sexual activity: Not Currently    Birth control/protection: I.U.D.    Comment: followed by GYN  Other Topics Concern   Not on file  Social History Narrative   Lives with fiance and kids   Right Handed   Drinks >12 cans of soda in caffeine    Social Drivers of Health   Financial Resource Strain: High Risk (04/18/2023)   Overall Financial Resource Strain (CARDIA)    Difficulty of Paying Living Expenses: Very hard  Food Insecurity: Food Insecurity  Present (04/18/2023)   Hunger Vital Sign    Worried About Running Out of Food in the Last Year: Sometimes true    Ran Out of Food in the Last Year: Never true  Transportation Needs: No Transportation Needs (04/18/2023)   PRAPARE - Administrator, Civil Service (Medical): No    Lack of Transportation (Non-Medical): No  Physical Activity: Sufficiently Active (04/18/2023)   Exercise Vital Sign    Days of Exercise per Week: 5 days    Minutes of Exercise per Session: 40 min  Stress: Stress Concern Present (04/18/2023)   [employer] of Occupational Health - Occupational Stress Questionnaire    Feeling of Stress : Very much  Social Connections: Unknown (04/18/2023)   Social Connection and Isolation Panel    Frequency of Communication with Friends and Family: Never    Frequency of Social Gatherings with Friends and Family: Never    Attends Religious Services: More than 4 times per year    Active Member of [employer] or Organizations: No    Attends Banker Meetings: Never    Marital Status: Patient declined  Catering manager Violence: Not At Risk (04/18/2023)   Humiliation, Afraid, Rape, and Kick questionnaire    Fear of Current or Ex-Partner: No    Emotionally Abused: No    Physically Abused: No    Sexually Abused: No    Medications:   Current Outpatient Medications on File Prior to Visit  Medication Sig Dispense Refill   amLODipine  (NORVASC ) 10 MG tablet Take 1 tablet (10 mg total) by mouth daily. 90 tablet 3   aspirin  EC 81 MG tablet Take 81 mg by mouth daily. Swallow whole.     clindamycin  (CLEOCIN  T) 1 % external solution Apply topically 2 (two) times daily. 30 mL 0   FLUoxetine  (PROZAC ) 10 MG capsule Take 1 capsule (10 mg total) by mouth daily. 90 capsule 1   fluticasone  (FLONASE ) 50 MCG/ACT nasal spray Use 2 sprays in each nostril BID for a week. After 1 week, decrease to 1 spray in each nostril BID as needed for congestion/allergies. 16 g 6   gabapentin   (NEURONTIN ) 600 MG tablet Take 1 tablet (600 mg total) by mouth 3 (three) times daily. 90 tablet 3   ibuprofen  (ADVIL ) 600 MG tablet Take by mouth.     lisinopril  (ZESTRIL ) 20 MG tablet Take 1 tablet (20 mg total) by mouth at bedtime. 90 tablet 1   methylPREDNISolone  (MEDROL  DOSEPAK) 4 MG TBPK tablet Follow package instructions. 21  tablet 0   rosuvastatin  (CRESTOR ) 10 MG tablet Take 1 tablet (10 mg total) by mouth daily. 90 tablet 3   tiZANidine  (ZANAFLEX ) 4 MG tablet Take 4 mg by mouth 2 (two) times daily.     topiramate  ER (QUDEXY  XR) 150 MG CS24 sprinkle capsule Take 1 capsule (150 mg total) by mouth at bedtime. 30 capsule 5   traZODone  (DESYREL ) 100 MG tablet Take 1 tablet (100 mg total) by mouth at bedtime. 90 tablet 5   No current facility-administered medications on file prior to visit.    Allergies:  No Known Allergies  Physical Exam Today's Vitals   11/15/23 1445  BP: 132/81  Pulse: 78  Weight: 209 lb (94.8 kg)  Height: 5' 5 (1.651 m)    Body mass index is 34.78 kg/m.  General: well developed, well nourished, very pleasant middle-aged African-American female, seated, in no evident distress  Neurologic Exam Mental Status: Awake and fully alert. Oriented to place and time. Recent and remote memory intact. Attention span, concentration and fund of knowledge appropriate. Mood and affect appropriate.  Cranial Nerves: Pupils equal, briskly reactive to light. Extraocular movements full without nystagmus. Visual fields full to confrontation. Hearing intact. Facial sensation intact. Face, tongue, palate moves normally and symmetrically.  Motor: Normal bulk and tone. Normal strength in all tested extremity muscles.  Diminished fine finger movements on the left.  Orbits right over left upper extremity. Sensory.:  Subjective diminished upper and lower extremity sensation.  Coordination: Rapid alternating movements normal on the right. Finger-to-nose and heel-to-shin performed  accurately bilaterally. Gait and Station: Arises from chair with  difficulty.  Gait is antalgic and mild favoring of RLE due to pain.  Ambulates without AD. Reflexes: 1+ and symmetric. Toes downgoing.          ASSESSMENT: 48 year old African-American lady with right thalamic lacunar infarct in October 2021 secondary to small vessel disease with residual poststroke paresthesias and mild left-sided weakness.  Vascular risk factors of hypertension, hyperlipidemia, and obesity.  She also has longstanding history of migraines which have improved some on Qudexy  but still occurring 1-2 times per week.  Continue lumbar radiculopathy with right sided sciatica now over the past 2 years.     PLAN:  1.  Migraine with aura  -Overall stable, approximately 1/week but can worsen during menstrual cycle -continue Qudexy  150 mg nightly  -start Nurtec as needed for rescue, can also take prior to onset of menstrual cycle for 4-5 days for prevention  - Triptans contraindicated due to stroke history  2. Hx of stroke  -Continue aspirin  and Crestor  for secondary stroke prevention managed/prescribed by PCP  -Continue close PCP follow-up for aggressive stroke risk factor management  - Stroke labs 07/2023: A1c 5.8, LDL 89 - recommend increasing Crestor  to 20 mg daily for LDL goal <70, repeat lipid panel with PCP in 2-3 months and if remains above goal, would recommend further adjustment  3.  Lumbar radiculopathy with right-sided sciatica  -Continue to follow with PMR Dr. Rachel Budds for pain management currently on gabapentin     Follow-up in 6 months or call earlier if needed     I personally spent a total of 40 minutes in the care of the patient today including preparing to see the patient, performing a medically appropriate exam/evaluation, counseling and educating, placing orders, and documenting clinical information in the EHR.   Johny Nap, AGNP-BC  [facility] [address]  [address] [place], [place] [address]  Phone [phone] Fax [phone] Note:  This document was prepared with digital dictation and possible smart phrase technology. Any transcriptional errors that result from this process are unintentional.

## 2023-11-15 NOTE — Patient Instructions (Signed)
 Your Plan:  Increase Crestor  to 20mg  daily, follow up with PCP for recheck of cholesterol levels in 2-3 months  Continue Qudexy  150mg  nightly for headache prevention  Start Nurtec as needed for migraine rescue - take at onset of migraine for optimal benefit. Can also take 2-3 days prior to your menstrual cycle and continue for total of 4-5 days (more for migraine prevention during that time)     Follow up in 6 months or call earlier if needed     Thank you for coming to see us  at [facility]. I hope we have been able to provide you high quality care today.  You may receive a patient satisfaction survey over the next few weeks. We would appreciate your feedback and comments so that we may continue to improve ourselves and the health of our patients.

## 2023-11-16 ENCOUNTER — Telehealth: Payer: Self-pay | Admitting: Pharmacist

## 2023-11-16 DIAGNOSIS — H5213 Myopia, bilateral: Secondary | ICD-10-CM | POA: Diagnosis not present

## 2023-11-16 NOTE — Telephone Encounter (Signed)
 Pharmacy Patient Advocate Encounter  Received notification from [facility] that Prior Authorization for Nurtec 75MG  dispersible tablets has been APPROVED from 11/16/2023 to 05/29/2024   PA #/Case ID/Reference #: [id number]

## 2023-11-16 NOTE — Telephone Encounter (Signed)
 Pharmacy Patient Advocate Encounter   Received notification from Patient Pharmacy that prior authorization for Nurtec 75MG  dispersible tablets is required/requested.   Insurance verification completed.   The patient is insured through [facility] .   Per test claim: PA required; PA submitted to above mentioned insurance via CoverMyMeds Key/confirmation #/EOC [id number] Status is pending

## 2023-11-23 ENCOUNTER — Ambulatory Visit

## 2024-01-10 ENCOUNTER — Encounter: Admitting: Physical Medicine & Rehabilitation

## 2024-01-18 ENCOUNTER — Ambulatory Visit (INDEPENDENT_AMBULATORY_CARE_PROVIDER_SITE_OTHER): Admitting: Emergency Medicine

## 2024-01-18 NOTE — Progress Notes (Signed)
   Subjective:  A user error has taken place: encounter opened in error, closed for administrative reasons. Was doing virtual AWV, call was disconnected. Tried to call patient back several times over a 20 min period of time. Unable to reach patient. LM on voice mail to call and reschedule.

## 2024-02-06 NOTE — Progress Notes (Deleted)
 Subjective:    Patient ID: Joyce [name], female    DOB: 1976/02/12, 48 y.o.   MRN: [id number]  HPI   Pain Inventory Average Pain {NUMBERS; 0-10:5044} Pain Right Now {NUMBERS; 0-10:5044} My pain is {PAIN DESCRIPTION:21022940}  In the last 24 hours, has pain interfered with the following? General activity {NUMBERS; 0-10:5044} Relation with others {NUMBERS; 0-10:5044} Enjoyment of life {NUMBERS; 0-10:5044} What TIME of day is your pain at its worst? {time of day:24191} Sleep (in general) {BHH GOOD/FAIR/POOR:22877}  Pain is worse with: {ACTIVITIES:21022942} Pain improves with: {PAIN IMPROVES TPUY:78977056} Relief from Meds: {NUMBERS; 0-10:5044}  Family History  Problem Relation Age of Onset   Diabetes Mother    Hypertension Mother    Kidney disease Mother    Asthma Mother    Hyperlipidemia Mother    Alzheimer's disease Father    Diabetes Father    Kidney disease Sister    Asthma Son    Diabetes Sister    Asthma Son    ADD / ADHD Son    Heart murmur Son    Asthma Son    Colon cancer Neg Hx    Colon polyps Neg Hx    Social History   Socioeconomic History   Marital status: Single    Spouse name: Hotel manager Love   Number of children: 8   Years of education: 9   Highest education level: Not on file  Occupational History   Occupation: unemployed    Comment: disabled  Tobacco Use   Smoking status: Never   Smokeless tobacco: Never  Vaping Use   Vaping status: Never Used  Substance and Sexual Activity   Alcohol use: Never   Drug use: No   Sexual activity: Not Currently    Birth control/protection: I.U.D.    Comment: followed by GYN  Other Topics Concern   Not on file  Social History Narrative   Lives with fiance and kids   Right Handed   Drinks >12 cans of soda in caffeine    Social Drivers of Health   Financial Resource Strain: High Risk (04/18/2023)   Overall Financial Resource Strain (CARDIA)    Difficulty of Paying Living Expenses: Very hard   Food Insecurity: Food Insecurity Present (04/18/2023)   Hunger Vital Sign    Worried About Running Out of Food in the Last Year: Sometimes true    Ran Out of Food in the Last Year: Never true  Transportation Needs: No Transportation Needs (04/18/2023)   PRAPARE - Administrator, Civil Service (Medical): No    Lack of Transportation (Non-Medical): No  Physical Activity: Sufficiently Active (04/18/2023)   Exercise Vital Sign    Days of Exercise per Week: 5 days    Minutes of Exercise per Session: 40 min  Stress: Stress Concern Present (04/18/2023)   [employer] of Occupational Health - Occupational Stress Questionnaire    Feeling of Stress : Very much  Social Connections: Unknown (04/18/2023)   Social Connection and Isolation Panel    Frequency of Communication with Friends and Family: Never    Frequency of Social Gatherings with Friends and Family: Never    Attends Religious Services: More than 4 times per year    Active Member of Clubs or Organizations: No    Attends Banker Meetings: Never    Marital Status: Patient declined   Past Surgical History:  Procedure Laterality Date   CHOLECYSTECTOMY     COLONOSCOPY WITH PROPOFOL  N/A 02/09/2022   Procedure: COLONOSCOPY WITH PROPOFOL ;  Surgeon: Unk Corinn Skiff, MD;  Location: [facility] ENDOSCOPY;  Service: Gastroenterology;  Laterality: N/A;   Past Surgical History:  Procedure Laterality Date   CHOLECYSTECTOMY     COLONOSCOPY WITH PROPOFOL  N/A 02/09/2022   Procedure: COLONOSCOPY WITH PROPOFOL ;  Surgeon: Unk Corinn Skiff, MD;  Location: [facility] ENDOSCOPY;  Service: Gastroenterology;  Laterality: N/A;   Past Medical History:  Diagnosis Date   Acute CVA (cerebrovascular accident) (HCC) 03/04/2020   Acute left-sided weakness 03/05/2020   Anemia    Depression    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   Hypertension    Lumbar radiculitis 06/23/2021   Migraine    PTSD (post-traumatic stress disorder) 05/01/2017    Round ligament pain 11/12/2014   LMP 01/12/2024 (Exact Date)   Opioid Risk Score:   Fall Risk Score:  `1  Depression screen [facility] 2/9     10/18/2023    3:42 PM 08/21/2023    3:55 PM 08/14/2023    3:29 PM 07/10/2023   11:11 AM 06/14/2023    3:26 PM 04/18/2023    3:11 PM 03/01/2023    4:17 PM  Depression screen PHQ 2/9  Decreased Interest 0 3 2 2 1 3 1   Down, Depressed, Hopeless 0 3 3 3 1 3 1   PHQ - 2 Score 0 6 5 5 2 6 2   Altered sleeping  3 2 3  2 1   Tired, decreased energy  3 3 2  3 1   Change in appetite  1 2 1  2 3   Feeling bad or failure about yourself   3 3 3  3 1   Trouble concentrating  3 3 3  3 1   Moving slowly or fidgety/restless  1 2 1  1  0  Suicidal thoughts  0 0 0  0 0  PHQ-9 Score  20 20 18  20 9   Difficult doing work/chores    Very difficult   Somewhat difficult    Review of Systems     Objective:   Physical Exam        Assessment & Plan:

## 2024-02-07 ENCOUNTER — Encounter: Attending: Physical Medicine & Rehabilitation | Admitting: Physical Medicine & Rehabilitation

## 2024-02-19 ENCOUNTER — Other Ambulatory Visit: Payer: Self-pay | Admitting: Physical Medicine & Rehabilitation

## 2024-02-19 DIAGNOSIS — G479 Sleep disorder, unspecified: Secondary | ICD-10-CM

## 2024-02-21 ENCOUNTER — Ambulatory Visit: Admitting: Internal Medicine

## 2024-02-28 ENCOUNTER — Encounter: Admitting: Physical Medicine & Rehabilitation

## 2024-02-28 ENCOUNTER — Ambulatory Visit: Admitting: Internal Medicine

## 2024-03-03 ENCOUNTER — Other Ambulatory Visit: Payer: Self-pay | Admitting: Internal Medicine

## 2024-03-03 DIAGNOSIS — I10 Essential (primary) hypertension: Secondary | ICD-10-CM

## 2024-03-05 ENCOUNTER — Other Ambulatory Visit: Payer: Self-pay

## 2024-03-05 ENCOUNTER — Ambulatory Visit: Admitting: Internal Medicine

## 2024-03-05 NOTE — Telephone Encounter (Signed)
 Requested medications are due for refill today.  yes  Requested medications are on the active medications list.  yes  Last refill. 07/10/2023 #90 1 rf  Future visit scheduled.   yes  Notes to clinic.  Expired labs    Requested Prescriptions  Pending Prescriptions Disp Refills   lisinopril  (ZESTRIL ) 20 MG tablet [Pharmacy Med Name: Lisinopril  20 MG Oral Tablet] 90 tablet 0    Sig: TAKE 1 TABLET BY MOUTH AT BEDTIME     Cardiovascular:  ACE Inhibitors Failed - 03/05/2024 12:23 PM      Failed - Cr in normal range and within 180 days    Creat  Date Value Ref Range Status  02/15/2017 0.80 0.50 - 1.10 mg/dL Final   Creatinine, Ser  Date Value Ref Range Status  07/20/2023 0.76 0.57 - 1.00 mg/dL Final   Creatinine, Urine  Date Value Ref Range Status  06/28/2016 278.00 mg/dL Final         Failed - K in normal range and within 180 days    Potassium  Date Value Ref Range Status  07/20/2023 4.5 3.5 - 5.2 mmol/L Final         Passed - Patient is not pregnant      Passed - Last BP in normal range    BP Readings from Last 1 Encounters:  11/15/23 132/81         Passed - Valid encounter within last 6 months    Recent Outpatient Visits           6 months ago Essential hypertension   [facility], Leita DEL, MD   6 months ago Vision changes   [facility] at [facility], Leita DEL, MD   7 months ago Essential hypertension   [facility], Leita DEL, MD       Future Appointments             In 3 weeks Alm Delon SAILOR, DO [facility]

## 2024-03-26 ENCOUNTER — Ambulatory Visit: Admitting: Dermatology

## 2024-03-26 ENCOUNTER — Encounter: Payer: Self-pay | Admitting: Dermatology

## 2024-03-26 VITALS — BP 154/93

## 2024-03-26 DIAGNOSIS — L700 Acne vulgaris: Secondary | ICD-10-CM

## 2024-03-26 DIAGNOSIS — L810 Postinflammatory hyperpigmentation: Secondary | ICD-10-CM | POA: Diagnosis not present

## 2024-03-26 MED ORDER — CLINDAMYCIN PHOSPHATE 1 % EX SWAB: 1.0000 | Freq: Every morning | CUTANEOUS | 5 refills | Status: AC

## 2024-03-26 MED ORDER — SPIRONOLACTONE 100 MG PO TABS: 100.0000 mg | ORAL_TABLET | Freq: Every day | ORAL | 5 refills | Status: AC

## 2024-03-26 MED ORDER — TRETINOIN 0.025 % EX CREA: TOPICAL_CREAM | CUTANEOUS | 0 refills | Status: DC

## 2024-03-26 NOTE — Progress Notes (Signed)
   New Patient Visit   Subjective  Joyce [name] is a 48 y.o. female who presents for a NEW PATIENT appointment to be examined for the concerns as listed below.  Cystic Acne: Patient noticed cystic acne starting 1 year ago. She has tried CeraVe acne cleanser & moisturizer. Her PCP has Rx clindamycin  solution to apply BID. She also received a topical abx cream to use from [facility] that worked well but she can not recall the name of it. When flared pt stated it is 10 out of 10 pain with itch.    Are you nursing, pregnant or trying to conceive? No    No Hx of Bx.  No family Hx of skin cancer.   The following portions of the chart were reviewed this encounter and updated as appropriate: medications, allergies, medical history  Review of Systems:  No other skin or systemic complaints except as noted in HPI or Assessment and Plan.  Objective  Well appearing patient in no apparent distress; mood and affect are within normal limits.   A focused examination was performed of the following areas: face   Relevant exam findings are noted in the Assessment and Plan.               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 Previous clindamycin  topical treatment provided some relief. Current regimen includes salicylic acid wash. Spironolactone is considered for systemic treatment to block hormone receptors and prevent deep, painful acne lesions. Risks include potential menstrual irregularities and hyperkalemia. Benefits include reduced acne severity and improved skin clarity. Shared decision-making involved discussing oral and topical treatment options, with her expressing interest in trying spironolactone and topical treatments.  - Prescribe spironolactone 100 mg to be taken nightly with dinner. -  Continue clindamycin  topical treatment in the morning using Clinomix swabs. - Use salicylic acid wash in the morning. - Provide samples of Tolerance moisturizer for use in the morning and night. - Prescribe tretinoin for nighttime use, two nights a week, with a pea-sized amount applied to the face. - Provide samples of Avene wash for nighttime use. - Schedule follow-up appointment in April to assess treatment efficacy and consider upgrading nighttime cream.  CYSTIC ACNE   Related Medications spironolactone (ALDACTONE) 100 MG tablet Take 1 tablet (100 mg total) by mouth daily. clindamycin  (CLEOCIN  T) 1 % SWAB Apply 1 Application topically in the morning. tretinoin (RETIN-A) 0.025 % cream use pea size amount (1gm) on face 2 nights a week.  Return in about 6 months (around 09/24/2024) for ACNE.  Documentation: I have reviewed the above documentation for accuracy and completeness, and I agree with the above.  I, Shirron Maranda, CMA, am acting as scribe for [employer], DO.   Delon Lenis, DO

## 2024-03-26 NOTE — Patient Instructions (Addendum)
 VISIT SUMMARY:  Today, we discussed your hormonal acne, which has been causing painful and itchy lesions on your face. We reviewed your past treatments and current regimen, and we decided on a new treatment plan to help manage your symptoms more effectively.  YOUR PLAN:  -HORMONAL ACNE:  Hormonal acne is caused by fluctuations in hormone levels, often related to perimenopause, and results in painful and itchy lesions on the face.  We have decided to start you on spironolactone 100 mg to be taken nightly with dinner to help block hormone receptors and prevent deep acne lesions.  You should continue using clindamycin  topical treatment in the morning  and your salicylic acid wash. Additionally, use the Tolerance moisturizer samples provided in the morning and at night.  For nighttime treatment, apply a pea-sized amount of tretinoin 0.025% cream two nights a week and use the Aven wash samples provided. We will reassess your treatment in April.  INSTRUCTIONS:  Please schedule a follow-up appointment in April to assess the effectiveness of your treatment and consider any necessary adjustments to your nighttime cream.    Important Information  Due to recent changes in healthcare laws, you may see results of your pathology and/or laboratory studies on MyChart before the doctors have had a chance to review them. We understand that in some cases there may be results that are confusing or concerning to you. Please understand that not all results are received at the same time and often the doctors may need to interpret multiple results in order to provide you with the best plan of care or course of treatment. Therefore, we ask that you please give us  2 business days to thoroughly review all your results before contacting the office for clarification. Should we see a critical lab result, you will be contacted sooner.   If You Need Anything After Your Visit  If you have any questions or concerns for your  doctor, please call our main line at [phone] If no one answers, please leave a voicemail as directed and we will return your call as soon as possible. Messages left after 4 pm will be answered the following business day.   You may also send us  a message via MyChart. We typically respond to MyChart messages within 1-2 business days.  For prescription refills, please ask your pharmacy to contact our office. Our fax number is [phone].  If you have an urgent issue when the clinic is closed that cannot wait until the next business day, you can page your doctor at the number below.    Please note that while we do our best to be available for urgent issues outside of office hours, we are not available 24/7.   If you have an urgent issue and are unable to reach us , you may choose to seek medical care at your doctor's office, retail clinic, urgent care center, or emergency room.  If you have a medical emergency, please immediately call 911 or go to the emergency department. In the event of inclement weather, please call our main line at [phone] for an update on the status of any delays or closures.  Dermatology Medication Tips: Please keep the boxes that topical medications come in in order to help keep track of the instructions about where and how to use these. Pharmacies typically print the medication instructions only on the boxes and not directly on the medication tubes.   If your medication is too expensive, please contact our office at [phone] or send us   a message through MyChart.   We are unable to tell what your co-pay for medications will be in advance as this is different depending on your insurance coverage. However, we may be able to find a substitute medication at lower cost or fill out paperwork to get insurance to cover a needed medication.   If a prior authorization is required to get your medication covered by your insurance company, please allow us  1-2 business days  to complete this process.  Drug prices often vary depending on where the prescription is filled and some pharmacies may offer cheaper prices.  The website www.goodrx.com contains coupons for medications through different pharmacies. The prices here do not account for what the cost may be with help from insurance (it may be cheaper with your insurance), but the website can give you the price if you did not use any insurance.  - You can print the associated coupon and take it with your prescription to the pharmacy.  - You may also stop by our office during regular business hours and pick up a GoodRx coupon card.  - If you need your prescription sent electronically to a different pharmacy, notify our office through [facility] or by phone at [phone]

## 2024-03-27 ENCOUNTER — Encounter: Payer: Self-pay | Admitting: Internal Medicine

## 2024-03-27 ENCOUNTER — Ambulatory Visit (INDEPENDENT_AMBULATORY_CARE_PROVIDER_SITE_OTHER): Admitting: Internal Medicine

## 2024-03-27 VITALS — BP 118/74 | HR 97 | Ht 65.0 in | Wt 214.0 lb

## 2024-03-27 DIAGNOSIS — Z23 Encounter for immunization: Secondary | ICD-10-CM | POA: Diagnosis not present

## 2024-03-27 DIAGNOSIS — F4323 Adjustment disorder with mixed anxiety and depressed mood: Secondary | ICD-10-CM

## 2024-03-27 DIAGNOSIS — G43709 Chronic migraine without aura, not intractable, without status migrainosus: Secondary | ICD-10-CM | POA: Diagnosis not present

## 2024-03-27 DIAGNOSIS — R7303 Prediabetes: Secondary | ICD-10-CM | POA: Insufficient documentation

## 2024-03-27 DIAGNOSIS — I10 Essential (primary) hypertension: Secondary | ICD-10-CM

## 2024-03-27 MED ORDER — TRETINOIN 0.025 % EX CREA: TOPICAL_CREAM | CUTANEOUS | 2 refills | Status: AC

## 2024-03-27 MED ORDER — LISINOPRIL 20 MG PO TABS: 20.0000 mg | ORAL_TABLET | Freq: Every day | ORAL | 0 refills | Status: AC

## 2024-03-27 MED ORDER — TRETINOIN 0.025 % EX CREA: TOPICAL_CREAM | CUTANEOUS | 0 refills | Status: DC

## 2024-03-27 MED ORDER — AMLODIPINE BESYLATE 10 MG PO TABS: 10.0000 mg | ORAL_TABLET | Freq: Every day | ORAL | 0 refills | Status: AC

## 2024-03-27 MED ORDER — DULOXETINE HCL 60 MG PO CPEP: 60.0000 mg | ORAL_CAPSULE | Freq: Every day | ORAL | 0 refills | Status: DC

## 2024-03-27 NOTE — Assessment & Plan Note (Signed)
 Well controlled blood pressure today after repeat testing. She just consumed a large coffee from [employer]. Current regimen is lisinopril  and amlodipine . No medication side effects noted. Will continue same medications, limit caffeine  and improve home monitoring.

## 2024-03-27 NOTE — Progress Notes (Signed)
 Date:  03/27/2024   Name:  Joyce [name]   DOB:  28-Mar-1976   MRN:  [id number]   Chief Complaint: Hypertension and Diabetes  Hypertension This is a chronic problem. The problem is controlled. Pertinent negatives include no chest pain, headaches, palpitations or shortness of breath. Past treatments include calcium  channel blockers and ACE inhibitors. Hypertensive end-organ damage includes CVA. There is no history of kidney disease or CAD/MI.  Diabetes She presents for her follow-up diabetic visit. Diabetes type: prediabetes. Hypoglycemia symptoms include nervousness/anxiousness. Pertinent negatives for hypoglycemia include no dizziness or headaches. Associated symptoms include weakness. Pertinent negatives for diabetes include no chest pain and no fatigue. Diabetic complications include a CVA.  Depression        This is a chronic problem.The problem is unchanged.  Associated symptoms include insomnia and decreased interest.  Associated symptoms include no fatigue, no myalgias and no headaches.  Treatments tried: Prozac  was not helpful.   Review of Systems  Constitutional:  Negative for fatigue and unexpected weight change.  HENT:  Negative for trouble swallowing.   Eyes:  Negative for visual disturbance.  Respiratory:  Negative for cough, chest tightness, shortness of breath and wheezing.   Cardiovascular:  Negative for chest pain, palpitations and leg swelling.  Gastrointestinal:  Negative for abdominal pain, constipation and diarrhea.  Genitourinary:  Negative for dysuria and hematuria.  Musculoskeletal:  Negative for arthralgias and myalgias.  Neurological:  Positive for weakness. Negative for dizziness, light-headedness and headaches.  Psychiatric/Behavioral:  Positive for depression, dysphoric mood and sleep disturbance. The patient is nervous/anxious and has insomnia.      Lab Results  Component Value Date   NA 139 07/20/2023   K 4.5 07/20/2023   CO2 23 07/20/2023    GLUCOSE 80 07/20/2023   BUN 8 07/20/2023   CREATININE 0.76 07/20/2023   CALCIUM  9.7 07/20/2023   EGFR 98 07/20/2023   GFRNONAA >60 03/28/2023   Lab Results  Component Value Date   CHOL 154 07/20/2023   HDL 54 07/20/2023   LDLCALC 89 07/20/2023   TRIG 55 07/20/2023   CHOLHDL 2.9 07/20/2023   Lab Results  Component Value Date   TSH 0.688 07/20/2023   Lab Results  Component Value Date   HGBA1C 5.8 (H) 07/20/2023   Lab Results  Component Value Date   WBC 7.4 07/20/2023   HGB 13.4 07/20/2023   HCT 42.4 07/20/2023   MCV 74 (L) 07/20/2023   PLT 363 07/20/2023   Lab Results  Component Value Date   ALT 16 07/20/2023   AST 19 07/20/2023   ALKPHOS 60 07/20/2023   BILITOT 0.4 07/20/2023   Lab Results  Component Value Date   VD25OH 33 02/15/2017     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   Cystic acne 03/01/2023   Sleep disorder 12/14/2022   Paresthesia of skin 09/18/2022   Mixed hyperlipidemia 09/18/2022   Primary osteoarthritis of knee 09/14/2022   Iron deficiency anemia 01/11/2022   Hamstring tendinitis at origin 10/13/2021   Lumbar radiculitis 06/23/2021   Right lateral epicondylitis 06/23/2021   Chronic left-sided low back pain without sciatica 07/01/2020   Neuropathic pain 05/06/2020   Anxiety state    Migraine without status migrainosus, not intractable    History of stroke 03/10/2020   Left hemiparesis (HCC) 03/10/2020   Recurrent falls 03/05/2020   Adjustment disorder with mixed anxiety and depressed mood  05/01/2017   PTSD (post-traumatic stress disorder) 05/01/2017   Essential hypertension 07/20/2016   Uterine fibroid 01/27/2016   Cervical high risk HPV (human papillomavirus) test positive 12/08/2014    No Known Allergies  Past Surgical History:  Procedure Laterality Date   CHOLECYSTECTOMY     COLONOSCOPY WITH PROPOFOL  N/A 02/09/2022   Procedure: COLONOSCOPY  WITH PROPOFOL ;  Surgeon: Unk Corinn Skiff, MD;  Location: [facility] ENDOSCOPY;  Service: Gastroenterology;  Laterality: N/A;    Social History   Tobacco Use   Smoking status: Never   Smokeless tobacco: Never  Vaping Use   Vaping status: Never Used  Substance Use Topics   Alcohol use: Never   Drug use: No     Medication list has been reviewed and updated.  Current Meds  Medication Sig   aspirin  EC 81 MG tablet Take 81 mg by mouth daily. Swallow whole.   clindamycin  (CLEOCIN  T) 1 % SWAB Apply 1 Application topically in the morning.   DULoxetine (CYMBALTA) 60 MG capsule Take 1 capsule (60 mg total) by mouth daily.   fluticasone  (FLONASE ) 50 MCG/ACT nasal spray Use 2 sprays in each nostril BID for a week. After 1 week, decrease to 1 spray in each nostril BID as needed for congestion/allergies.   gabapentin  (NEURONTIN ) 600 MG tablet Take 1 tablet (600 mg total) by mouth 3 (three) times daily.   ibuprofen  (ADVIL ) 600 MG tablet Take by mouth.   Rimegepant Sulfate (NURTEC) 75 MG TBDP Take 1 tablet (75 mg total) by mouth as needed.   rosuvastatin  (CRESTOR ) 20 MG tablet Take 1 tablet (20 mg total) by mouth daily.   spironolactone (ALDACTONE) 100 MG tablet Take 1 tablet (100 mg total) by mouth daily.   tiZANidine  (ZANAFLEX ) 4 MG tablet Take 4 mg by mouth 2 (two) times daily.   topiramate  ER (QUDEXY  XR) 150 MG CS24 sprinkle capsule Take 1 capsule (150 mg total) by mouth at bedtime.   traZODone  (DESYREL ) 100 MG tablet TAKE 1 TABLET BY MOUTH AT BEDTIME   tretinoin (RETIN-A) 0.025 % cream use pea size amount (1gm) on face 2 nights a week.   [DISCONTINUED] amLODipine  (NORVASC ) 10 MG tablet Take 1 tablet (10 mg total) by mouth daily.   [DISCONTINUED] FLUoxetine  (PROZAC ) 10 MG capsule Take 1 capsule (10 mg total) by mouth daily.   [DISCONTINUED] lisinopril  (ZESTRIL ) 20 MG tablet TAKE 1 TABLET BY MOUTH AT BEDTIME   [DISCONTINUED] methylPREDNISolone  (MEDROL  DOSEPAK) 4 MG TBPK tablet Follow package  instructions.       03/27/2024   10:40 AM 08/21/2023    3:55 PM 08/14/2023    3:29 PM 07/10/2023   11:11 AM  GAD 7 : Generalized Anxiety Score  Nervous, Anxious, on Edge 1 3 1 2   Control/stop worrying 1 3 3 3   Worry too much - different things 1 3 3 3   Trouble relaxing 0 3 3 2   Restless 0 3 3 2   Easily annoyed or irritable 0 3 3 3   Afraid - awful might happen 0 3 3 2   Total GAD 7 Score 3 21 19 17   Anxiety Difficulty Not difficult at all   Very difficult       03/27/2024   10:40 AM 10/18/2023    3:42 PM 08/21/2023    3:55 PM  Depression screen PHQ 2/9  Decreased Interest 0 0 3  Down, Depressed, Hopeless 0 0 3  PHQ - 2 Score 0 0 6  Altered sleeping 1  3  Tired, decreased energy  1  3  Change in appetite 0  1  Feeling bad or failure about yourself  0  3  Trouble concentrating 0  3  Moving slowly or fidgety/restless 0  1  Suicidal thoughts 0  0  PHQ-9 Score 2  20  Difficult doing work/chores Not difficult at all      BP Readings from Last 3 Encounters:  03/27/24 118/74  03/26/24 (!) 154/93  11/15/23 132/81    Physical Exam Vitals and nursing note reviewed.  Constitutional:      General: She is not in acute distress.    Appearance: She is well-developed.  HENT:     Head: Normocephalic and atraumatic.  Neck:     Vascular: No carotid bruit.  Cardiovascular:     Rate and Rhythm: Normal rate and regular rhythm.     Heart sounds: No murmur heard. Pulmonary:     Effort: Pulmonary effort is normal. No respiratory distress.  Musculoskeletal:     Cervical back: Normal range of motion.     Right lower leg: No edema.     Left lower leg: No edema.  Lymphadenopathy:     Cervical: No cervical adenopathy.  Skin:    General: Skin is warm and dry.     Findings: No rash.  Neurological:     Mental Status: She is alert and oriented to person, place, and time.  Psychiatric:        Mood and Affect: Mood normal.        Behavior: Behavior normal.     Wt Readings from Last 3  Encounters:  03/27/24 214 lb (97.1 kg)  01/18/24 205 lb (93 kg)  11/15/23 209 lb (94.8 kg)    BP 118/74   Pulse 97   Ht 5' 5 (1.651 m)   Wt 214 lb (97.1 kg)   SpO2 100%   BMI 35.61 kg/m   Assessment and Plan:  Problem List Items Addressed This Visit       Unprioritized   Essential hypertension - Primary (Chronic)   Well controlled blood pressure today after repeat testing. She just consumed a large coffee from [employer]. Current regimen is lisinopril  and amlodipine . No medication side effects noted. Will continue same medications, limit caffeine  and improve home monitoring.        Relevant Medications   amLODipine  (NORVASC ) 10 MG tablet   lisinopril  (ZESTRIL ) 20 MG tablet   Adjustment disorder with mixed anxiety and depressed mood (Chronic)   Depression and anxiety symptoms are stable and well controlled on Prozac  every other day.  She has not noticed much benefit. No SI/HI reported. Recommend d/c Prozac .  Will try Cymbalta 60 mg daily to help with mood but may also help chronic pain.         Relevant Medications   DULoxetine (CYMBALTA) 60 MG capsule   Migraine without status migrainosus, not intractable (Chronic)   Followed by Neurology - on topiramate  and prn Nurtec      Relevant Medications   DULoxetine (CYMBALTA) 60 MG capsule   amLODipine  (NORVASC ) 10 MG tablet   lisinopril  (ZESTRIL ) 20 MG tablet   Prediabetes   Lab Results  Component Value Date   HGBA1C 5.8 (H) 07/20/2023  Borderline elevation earlier this year. Discussed reduced carbohydrates diet choices, exercise and modest weight loss (in addition to avoiding further weight gain). Recommend recheck labs at next visit.      Other Visit Diagnoses       Encounter for immunization  Relevant Orders   Flu vaccine trivalent PF, 6mos and older(Flulaval,Afluria,Fluarix,Fluzone) (Completed)       Return in about 3 months (around 06/27/2024) for HTN, Mood disorder = Dr. Lemon.    Leita HILARIO Adie, MD [facility] [place]

## 2024-03-27 NOTE — Assessment & Plan Note (Signed)
 Lab Results  Component Value Date   HGBA1C 5.8 (H) 07/20/2023  Borderline elevation earlier this year. Discussed reduced carbohydrates diet choices, exercise and modest weight loss (in addition to avoiding further weight gain). Recommend recheck labs at next visit.

## 2024-03-27 NOTE — Assessment & Plan Note (Addendum)
 Depression and anxiety symptoms are stable and well controlled on Prozac  every other day.  She has not noticed much benefit. No SI/HI reported. Recommend d/c Prozac .  Will try Cymbalta 60 mg daily to help with mood but may also help chronic pain.

## 2024-03-27 NOTE — Patient Instructions (Signed)
 Ms. Stanczak,  We hope you enjoyed your recent visit with our office! Your feedback means so much to our team, and it helps us  to continue providing the best care possible. If you had a positive experience, we'd love if you could share it by leaving us  a [employer] Review and also completing our patient survey that you'll receive soon.  Your kind words not only brighten our day but also help other patients feel confident in choosing our office for their care.  Thank you for being a part of our practice family!   Eual Grieves, CMA & Dr Leita Adie, MD [facility] [place] [address]  [place] [address] Office [phone]  Fax: [phone]'

## 2024-03-27 NOTE — Assessment & Plan Note (Signed)
 Followed by Neurology - on topiramate  and prn Nurtec

## 2024-04-03 ENCOUNTER — Encounter: Admitting: Physical Medicine & Rehabilitation

## 2024-04-19 ENCOUNTER — Other Ambulatory Visit: Payer: Self-pay | Admitting: Physical Medicine & Rehabilitation

## 2024-05-22 ENCOUNTER — Encounter: Attending: Physical Medicine & Rehabilitation | Admitting: Physical Medicine & Rehabilitation

## 2024-05-24 ENCOUNTER — Other Ambulatory Visit (HOSPITAL_COMMUNITY): Payer: Self-pay

## 2024-05-24 ENCOUNTER — Telehealth: Payer: Self-pay

## 2024-05-24 NOTE — Telephone Encounter (Signed)
 Hello Prescriber!  We are in the process of submitting a prior authorization for your patient for Nurtec renewal. We are reaching out for clinical guidance for the following information to complete the request: Plan requiring documentation of clinical benefit since starting therapy.       Thank you! Pharmacy Team

## 2024-05-27 NOTE — Telephone Encounter (Signed)
 I called the patient to see if Nurtec has been helping so that PA can be completed. I left a message for her to call the office back.

## 2024-06-05 NOTE — Telephone Encounter (Signed)
"   Clinical questions have expired-I will archive PA. "

## 2024-06-10 ENCOUNTER — Ambulatory Visit: Admitting: Adult Health

## 2024-06-27 ENCOUNTER — Encounter: Payer: Self-pay | Admitting: Student

## 2024-06-27 ENCOUNTER — Ambulatory Visit: Admitting: Student

## 2024-06-27 VITALS — BP 156/96 | HR 67 | Ht 65.0 in | Wt 214.0 lb

## 2024-06-27 DIAGNOSIS — I10 Essential (primary) hypertension: Secondary | ICD-10-CM

## 2024-06-27 DIAGNOSIS — F4323 Adjustment disorder with mixed anxiety and depressed mood: Secondary | ICD-10-CM

## 2024-06-27 DIAGNOSIS — Z1231 Encounter for screening mammogram for malignant neoplasm of breast: Secondary | ICD-10-CM

## 2024-06-27 DIAGNOSIS — E782 Mixed hyperlipidemia: Secondary | ICD-10-CM

## 2024-06-27 MED ORDER — DULOXETINE HCL 30 MG PO CPEP: 30.0000 mg | ORAL_CAPSULE | Freq: Every day | ORAL | 3 refills | Status: AC

## 2024-06-27 NOTE — Assessment & Plan Note (Addendum)
 Current medications are lisinopril  20 mg a day and amlodipine  10 mg daily. Did not take her medications today. She has not check her BP at home.  Recommend taking her medications at night as she forgets in the morning.  Was started on spironolactone  by Dermatology for acne. BP elevated today likely due to missing medications. Keep home log, follow up in 1 month.

## 2024-06-27 NOTE — Progress Notes (Signed)
 "  Established Patient Office Visit  Subjective   Patient ID: Joyce [name], female    DOB: 11-15-1975  Age: 49 y.o. MRN: [id number]  Chief Complaint  Patient presents with   Hypertension    Joyce [name] is a 49 y.o. person with medical hx listed below who presents today for hypertension follow up. Please refer to problem based charting for further details and assessment and plan of current problem and chronic medical conditions.  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   Cystic acne 03/01/2023   Sleep disorder 12/14/2022   Paresthesia of skin 09/18/2022   Mixed hyperlipidemia 09/18/2022   Primary osteoarthritis of knee 09/14/2022   Iron deficiency anemia 01/11/2022   Hamstring tendinitis at origin 10/13/2021   Lumbar radiculitis 06/23/2021   Right lateral epicondylitis 06/23/2021   Chronic left-sided low back pain without sciatica 07/01/2020   Neuropathic pain 05/06/2020   Anxiety state    Migraine without status migrainosus, not intractable    History of stroke 03/10/2020   Left hemiparesis (HCC) 03/10/2020   Recurrent falls 03/05/2020   Adjustment disorder with mixed anxiety and depressed mood 05/01/2017   PTSD (post-traumatic stress disorder) 05/01/2017   Essential hypertension 07/20/2016   Uterine fibroid 01/27/2016   Cervical high risk HPV (human papillomavirus) test positive 12/08/2014      ROS Refer to HPI    Objective:     Outpatient Encounter Medications as of 06/27/2024  Medication Sig   amLODipine  (NORVASC ) 10 MG tablet Take 1 tablet (10 mg total) by mouth daily.   aspirin  EC 81 MG tablet Take 81 mg by mouth daily. Swallow whole.   clindamycin  (CLEOCIN  T) 1 % SWAB Apply 1 Application topically in the morning.   DULoxetine  (CYMBALTA ) 30 MG capsule Take 1 capsule (30 mg total) by mouth daily.   fluticasone  (FLONASE ) 50 MCG/ACT nasal spray Use 2 sprays in each  nostril BID for a week. After 1 week, decrease to 1 spray in each nostril BID as needed for congestion/allergies.   gabapentin  (NEURONTIN ) 600 MG tablet TAKE 1 TABLET BY MOUTH THREE TIMES DAILY   ibuprofen  (ADVIL ) 600 MG tablet Take by mouth.   lisinopril  (ZESTRIL ) 20 MG tablet Take 1 tablet (20 mg total) by mouth at bedtime.   Rimegepant Sulfate (NURTEC) 75 MG TBDP Take 1 tablet (75 mg total) by mouth as needed.   rosuvastatin  (CRESTOR ) 20 MG tablet Take 1 tablet (20 mg total) by mouth daily.   spironolactone  (ALDACTONE ) 100 MG tablet Take 1 tablet (100 mg total) by mouth daily.   tiZANidine  (ZANAFLEX ) 4 MG tablet Take 4 mg by mouth 2 (two) times daily.   topiramate  ER (QUDEXY  XR) 150 MG CS24 sprinkle capsule Take 1 capsule (150 mg total) by mouth at bedtime.   traZODone  (DESYREL ) 100 MG tablet TAKE 1 TABLET BY MOUTH AT BEDTIME   tretinoin  (RETIN-A ) 0.025 % cream use pea size amount (1gm) on face 2 nights a week.   [DISCONTINUED] DULoxetine  (CYMBALTA ) 60 MG capsule Take 1 capsule (60 mg total) by mouth daily.   No facility-administered encounter medications on file as of 06/27/2024.    BP (!) 156/96   Pulse 67   Ht 5' 5 (1.651 m)   Wt 214 lb (97.1 kg)   LMP 06/26/2024 (Approximate)   SpO2 96%   BMI 35.61 kg/m  BP Readings from Last 3 Encounters:  06/27/24 (!) 156/96  03/27/24 118/74  03/26/24 (!) 154/93    Physical Exam Constitutional:      Appearance: Normal appearance.  HENT:     Mouth/Throat:     Mouth: Mucous membranes are moist.     Pharynx: Oropharynx is clear.  Eyes:     Extraocular Movements: Extraocular movements intact.     Pupils: Pupils are equal, round, and reactive to light.  Cardiovascular:     Rate and Rhythm: Normal rate and regular rhythm.  Pulmonary:     Effort: Pulmonary effort is normal.     Breath sounds: No rhonchi or rales.  Abdominal:     General: Abdomen is flat. Bowel sounds are normal. There is no distension.     Palpations: Abdomen is soft.      Tenderness: There is no abdominal tenderness.  Musculoskeletal:        General: Normal range of motion.     Right lower leg: No edema.     Left lower leg: No edema.  Skin:    General: Skin is warm and dry.     Capillary Refill: Capillary refill takes less than 2 seconds.  Neurological:     General: No focal deficit present.     Mental Status: She is alert and oriented to person, place, and time. Mental status is at baseline.  Psychiatric:        Mood and Affect: Mood normal.        Behavior: Behavior normal.        06/27/2024    4:32 PM 03/27/2024   10:40 AM 10/18/2023    3:42 PM  Depression screen PHQ 2/9  Decreased Interest 2 0 0  Down, Depressed, Hopeless 3 0 0  PHQ - 2 Score 5 0 0  Altered sleeping 2 1   Tired, decreased energy 3 1   Change in appetite 3 0   Feeling bad or failure about yourself  3 0   Trouble concentrating 3 0   Moving slowly or fidgety/restless 2 0   Suicidal thoughts 0 0   PHQ-9 Score 21 2    Difficult doing work/chores Somewhat difficult Not difficult at all      Data saved with a previous flowsheet row definition       06/27/2024    4:32 PM 03/27/2024   10:40 AM 08/21/2023    3:55 PM 08/14/2023    3:29 PM  GAD 7 : Generalized Anxiety Score  Nervous, Anxious, on Edge 3 1  3  1    Control/stop worrying 3 1  3  3    Worry too much - different things 3 1  3  3    Trouble relaxing 3 0  3  3   Restless 2 0  3  3   Easily annoyed or irritable 3 0  3  3   Afraid - awful might happen 2 0  3  3   Total GAD 7 Score 19 3 21 19   Anxiety Difficulty Somewhat difficult Not difficult at all       Data saved with a previous flowsheet row definition    No results found for any visits on 06/27/24.  Last CBC Lab Results  Component Value Date   WBC 7.4 07/20/2023   HGB 13.4 07/20/2023   HCT 42.4 07/20/2023   MCV 74 (L) 07/20/2023   MCH 23.3 (L) 07/20/2023   RDW 17.1 (H) 07/20/2023   PLT 363 07/20/2023   Last metabolic panel Lab Results   Component Value  Date   GLUCOSE 80 07/20/2023   NA 139 07/20/2023   K 4.5 07/20/2023   CL 102 07/20/2023   CO2 23 07/20/2023   BUN 8 07/20/2023   CREATININE 0.76 07/20/2023   EGFR 98 07/20/2023   CALCIUM  9.7 07/20/2023   PHOS 3.7 09/18/2022   PROT 7.3 07/20/2023   ALBUMIN 4.3 07/20/2023   LABGLOB 3.0 07/20/2023   AGRATIO 1.6 08/15/2022   BILITOT 0.4 07/20/2023   ALKPHOS 60 07/20/2023   AST 19 07/20/2023   ALT 16 07/20/2023   ANIONGAP 10 03/28/2023   Last lipids Lab Results  Component Value Date   CHOL 154 07/20/2023   HDL 54 07/20/2023   LDLCALC 89 07/20/2023   TRIG 55 07/20/2023   CHOLHDL 2.9 07/20/2023   Last hemoglobin A1c Lab Results  Component Value Date   HGBA1C 5.8 (H) 07/20/2023   Last thyroid functions Lab Results  Component Value Date   TSH 0.688 07/20/2023      The ASCVD Risk score (Arnett DK, et al., 2019) failed to calculate for the following reasons:   Risk score cannot be calculated because patient has a medical history suggesting prior/existing ASCVD   * - Cholesterol units were assumed    Assessment & Plan:  Essential hypertension Assessment & Plan: Current medications are lisinopril  20 mg a day and amlodipine  10 mg daily. Did not take her medications today. She has not check her BP at home.  Recommend taking her medications at night as she forgets in the morning.  Was started on spironolactone  by Dermatology for acne. BP elevated today likely due to missing medications. Keep home log, follow up in 1 month.    Mixed hyperlipidemia Assessment & Plan: Secondary prevention on rosuvastatin  20 mg daily.  Continue current medication   Screening mammogram for breast cancer -     3D Screening Mammogram, Left and Right  Adjustment disorder with mixed anxiety and depressed mood Assessment & Plan: Reprots increase irritabiliy and lack of motivation lately. Cymbalta  improved mood but make her very sleepy. Will decrease Cymbalta  to 30 mg daily.     Other orders -     DULoxetine  HCl; Take 1 capsule (30 mg total) by mouth daily.  Dispense: 90 capsule; Refill: 3     Return in about 4 weeks (around 07/25/2024) for HTN, mood.    Harlene Saddler, MD "

## 2024-06-27 NOTE — Assessment & Plan Note (Deleted)
 Reprots increase irritabiliy and lac of motivation, did not feel prozac   Cymbalta  made her very sleepy  did improve the mood

## 2024-07-03 ENCOUNTER — Encounter: Admitting: Physical Medicine & Rehabilitation

## 2024-07-05 NOTE — Assessment & Plan Note (Signed)
 Intermittent numbness, no recent falls, uses cane occasionally.

## 2024-07-05 NOTE — Assessment & Plan Note (Signed)
 Reprots increase irritabiliy and lack of motivation lately. Cymbalta  improved mood but make her very sleepy. Will decrease Cymbalta  to 30 mg daily.

## 2024-07-18 ENCOUNTER — Ambulatory Visit: Admitting: Adult Health

## 2024-08-02 ENCOUNTER — Ambulatory Visit: Admitting: Student

## 2024-08-28 ENCOUNTER — Encounter: Admitting: Physical Medicine & Rehabilitation

## 2024-09-25 ENCOUNTER — Ambulatory Visit: Admitting: Dermatology
